# Patient Record
Sex: Female | Born: 1960
Health system: Southern US, Community
[De-identification: ages and names within clinical notes are randomized; demographics above are authoritative.]

## PROBLEM LIST (undated history)

## (undated) DIAGNOSIS — F329 Major depressive disorder, single episode, unspecified: Secondary | ICD-10-CM

## (undated) DIAGNOSIS — I10 Essential (primary) hypertension: Secondary | ICD-10-CM

## (undated) DIAGNOSIS — I509 Heart failure, unspecified: Secondary | ICD-10-CM

## (undated) DIAGNOSIS — I700 Atherosclerosis of aorta: Secondary | ICD-10-CM

## (undated) DIAGNOSIS — N952 Postmenopausal atrophic vaginitis: Secondary | ICD-10-CM

## (undated) DIAGNOSIS — L259 Unspecified contact dermatitis, unspecified cause: Secondary | ICD-10-CM

## (undated) DIAGNOSIS — G5603 Carpal tunnel syndrome, bilateral upper limbs: Secondary | ICD-10-CM

## (undated) DIAGNOSIS — K5790 Diverticulosis of intestine, part unspecified, without perforation or abscess without bleeding: Secondary | ICD-10-CM

## (undated) DIAGNOSIS — M545 Low back pain, unspecified: Secondary | ICD-10-CM

## (undated) DIAGNOSIS — R9431 Abnormal electrocardiogram [ECG] [EKG]: Secondary | ICD-10-CM

## (undated) DIAGNOSIS — E559 Vitamin D deficiency, unspecified: Secondary | ICD-10-CM

## (undated) DIAGNOSIS — M1990 Unspecified osteoarthritis, unspecified site: Secondary | ICD-10-CM

## (undated) DIAGNOSIS — I639 Cerebral infarction, unspecified: Secondary | ICD-10-CM

## (undated) DIAGNOSIS — G4700 Insomnia, unspecified: Secondary | ICD-10-CM

## (undated) DIAGNOSIS — E8881 Metabolic syndrome: Secondary | ICD-10-CM

## (undated) DIAGNOSIS — K219 Gastro-esophageal reflux disease without esophagitis: Secondary | ICD-10-CM

## (undated) DIAGNOSIS — M170 Bilateral primary osteoarthritis of knee: Secondary | ICD-10-CM

## (undated) DIAGNOSIS — J449 Chronic obstructive pulmonary disease, unspecified: Secondary | ICD-10-CM

## (undated) DIAGNOSIS — J40 Bronchitis, not specified as acute or chronic: Secondary | ICD-10-CM

## (undated) DIAGNOSIS — F32A Depression, unspecified: Secondary | ICD-10-CM

## (undated) DIAGNOSIS — M5030 Other cervical disc degeneration, unspecified cervical region: Secondary | ICD-10-CM

## (undated) DIAGNOSIS — K589 Irritable bowel syndrome without diarrhea: Secondary | ICD-10-CM

## (undated) DIAGNOSIS — E785 Hyperlipidemia, unspecified: Secondary | ICD-10-CM

## (undated) DIAGNOSIS — D649 Anemia, unspecified: Secondary | ICD-10-CM

## (undated) DIAGNOSIS — N951 Menopausal and female climacteric states: Secondary | ICD-10-CM

## (undated) DIAGNOSIS — E2839 Other primary ovarian failure: Secondary | ICD-10-CM

## (undated) DIAGNOSIS — K279 Peptic ulcer, site unspecified, unspecified as acute or chronic, without hemorrhage or perforation: Secondary | ICD-10-CM

## (undated) DIAGNOSIS — L309 Dermatitis, unspecified: Secondary | ICD-10-CM

## (undated) HISTORY — PX: JOINT REPLACEMENT: SHX530

## (undated) HISTORY — DX: Depression, unspecified: F32.A

## (undated) HISTORY — PX: CERVICAL DISCECTOMY: SHX98

## (undated) HISTORY — DX: Metabolic syndrome: E88.810

## (undated) HISTORY — PX: BACK SURGERY: SHX140

## (undated) HISTORY — PX: TONSILLECTOMY: SUR1361

## (undated) HISTORY — DX: Other primary ovarian failure: E28.39

## (undated) HISTORY — PX: BREAST BIOPSY: SHX20

## (undated) HISTORY — DX: Low back pain: M54.5

## (undated) HISTORY — DX: Chronic obstructive pulmonary disease, unspecified: J44.9

## (undated) HISTORY — DX: Insomnia, unspecified: G47.00

## (undated) HISTORY — DX: Metabolic syndrome: E88.81

## (undated) HISTORY — DX: Postmenopausal atrophic vaginitis: N95.2

## (undated) HISTORY — DX: Essential (primary) hypertension: I10

## (undated) HISTORY — DX: Vitamin D deficiency, unspecified: E55.9

## (undated) HISTORY — PX: BREAST LUMPECTOMY: SHX2

## (undated) HISTORY — DX: Unspecified contact dermatitis, unspecified cause: L25.9

## (undated) HISTORY — DX: Irritable bowel syndrome, unspecified: K58.9

## (undated) HISTORY — PX: DILATION AND CURETTAGE OF UTERUS: SHX78

## (undated) HISTORY — PX: ENDOMETRIAL ABLATION: SHX621

## (undated) HISTORY — DX: Menopausal and female climacteric states: N95.1

## (undated) HISTORY — PX: CHOLECYSTECTOMY: SHX55

## (undated) HISTORY — PX: SPINAL FUSION: SHX223

## (undated) HISTORY — DX: Hyperlipidemia, unspecified: E78.5

## (undated) HISTORY — DX: Major depressive disorder, single episode, unspecified: F32.9

## (undated) HISTORY — DX: Abnormal electrocardiogram (ECG) (EKG): R94.31

## (undated) HISTORY — DX: Low back pain, unspecified: M54.50

## (undated) HISTORY — PX: BREAST EXCISIONAL BIOPSY: SUR124

## (undated) HISTORY — PX: TUBAL LIGATION: SHX77

---

## 2003-09-23 ENCOUNTER — Other Ambulatory Visit: Payer: Self-pay

## 2005-02-23 ENCOUNTER — Ambulatory Visit: Payer: Self-pay | Admitting: Unknown Physician Specialty

## 2005-03-24 ENCOUNTER — Emergency Department: Payer: Self-pay | Admitting: Emergency Medicine

## 2005-07-15 ENCOUNTER — Other Ambulatory Visit: Payer: Self-pay

## 2005-07-28 ENCOUNTER — Ambulatory Visit: Payer: Self-pay

## 2005-08-28 ENCOUNTER — Ambulatory Visit (HOSPITAL_COMMUNITY): Admission: RE | Admit: 2005-08-28 | Discharge: 2005-08-28 | Payer: Self-pay | Admitting: Neurosurgery

## 2005-10-19 ENCOUNTER — Encounter: Payer: Self-pay | Admitting: Neurosurgery

## 2005-11-02 ENCOUNTER — Encounter: Payer: Self-pay | Admitting: Neurosurgery

## 2005-12-28 ENCOUNTER — Encounter: Admission: RE | Admit: 2005-12-28 | Discharge: 2005-12-28 | Payer: Self-pay | Admitting: Neurosurgery

## 2006-01-04 ENCOUNTER — Encounter: Payer: Self-pay | Admitting: Neurosurgery

## 2006-03-11 ENCOUNTER — Ambulatory Visit (HOSPITAL_COMMUNITY): Admission: RE | Admit: 2006-03-11 | Discharge: 2006-03-15 | Payer: Self-pay | Admitting: Neurosurgery

## 2006-03-12 ENCOUNTER — Ambulatory Visit: Payer: Self-pay | Admitting: Internal Medicine

## 2006-04-02 ENCOUNTER — Ambulatory Visit: Payer: Self-pay | Admitting: Internal Medicine

## 2006-04-06 ENCOUNTER — Ambulatory Visit: Payer: Self-pay

## 2006-04-28 ENCOUNTER — Ambulatory Visit (HOSPITAL_COMMUNITY): Admission: RE | Admit: 2006-04-28 | Discharge: 2006-04-29 | Payer: Self-pay | Admitting: Neurosurgery

## 2006-05-20 ENCOUNTER — Ambulatory Visit: Payer: Self-pay | Admitting: Internal Medicine

## 2006-06-02 ENCOUNTER — Ambulatory Visit: Payer: Self-pay | Admitting: Internal Medicine

## 2006-07-02 ENCOUNTER — Ambulatory Visit: Payer: Self-pay | Admitting: Neurosurgery

## 2006-08-04 ENCOUNTER — Emergency Department: Payer: Self-pay | Admitting: Emergency Medicine

## 2006-12-08 ENCOUNTER — Ambulatory Visit: Payer: Self-pay | Admitting: Neurosurgery

## 2007-03-08 ENCOUNTER — Ambulatory Visit: Payer: Self-pay | Admitting: Neurosurgery

## 2007-08-04 ENCOUNTER — Ambulatory Visit: Payer: Self-pay | Admitting: Neurosurgery

## 2007-09-19 ENCOUNTER — Emergency Department: Payer: Self-pay | Admitting: Emergency Medicine

## 2007-11-25 ENCOUNTER — Ambulatory Visit: Payer: Self-pay

## 2007-12-01 ENCOUNTER — Ambulatory Visit: Payer: Self-pay

## 2007-12-26 ENCOUNTER — Ambulatory Visit (HOSPITAL_COMMUNITY): Admission: RE | Admit: 2007-12-26 | Discharge: 2007-12-27 | Payer: Self-pay | Admitting: Neurosurgery

## 2008-01-24 ENCOUNTER — Ambulatory Visit: Payer: Self-pay

## 2008-01-31 ENCOUNTER — Ambulatory Visit: Payer: Self-pay

## 2008-03-01 ENCOUNTER — Encounter: Admission: RE | Admit: 2008-03-01 | Discharge: 2008-03-01 | Payer: Self-pay | Admitting: Neurosurgery

## 2008-04-12 ENCOUNTER — Encounter: Admission: RE | Admit: 2008-04-12 | Discharge: 2008-04-12 | Payer: Self-pay | Admitting: Neurosurgery

## 2008-06-20 ENCOUNTER — Ambulatory Visit: Payer: Self-pay | Admitting: Family Medicine

## 2008-08-22 ENCOUNTER — Ambulatory Visit: Payer: Self-pay | Admitting: Neurosurgery

## 2008-11-14 ENCOUNTER — Ambulatory Visit (HOSPITAL_COMMUNITY): Admission: RE | Admit: 2008-11-14 | Discharge: 2008-11-15 | Payer: Self-pay | Admitting: Neurosurgery

## 2008-12-13 ENCOUNTER — Ambulatory Visit: Payer: Self-pay | Admitting: Neurosurgery

## 2008-12-22 ENCOUNTER — Ambulatory Visit: Payer: Self-pay | Admitting: Neurosurgery

## 2009-03-29 ENCOUNTER — Ambulatory Visit: Payer: Self-pay | Admitting: Neurosurgery

## 2009-05-13 ENCOUNTER — Ambulatory Visit: Payer: Self-pay | Admitting: Pain Medicine

## 2009-07-01 ENCOUNTER — Ambulatory Visit: Payer: Self-pay | Admitting: Pain Medicine

## 2009-07-12 ENCOUNTER — Ambulatory Visit: Payer: Self-pay | Admitting: Pain Medicine

## 2009-07-15 ENCOUNTER — Ambulatory Visit: Payer: Self-pay | Admitting: Pain Medicine

## 2009-07-29 ENCOUNTER — Emergency Department: Payer: Self-pay | Admitting: Emergency Medicine

## 2009-08-27 ENCOUNTER — Ambulatory Visit: Payer: Self-pay | Admitting: Physician Assistant

## 2009-09-03 ENCOUNTER — Ambulatory Visit: Payer: Self-pay | Admitting: Gastroenterology

## 2009-09-24 ENCOUNTER — Ambulatory Visit: Payer: Self-pay | Admitting: Physician Assistant

## 2009-10-08 ENCOUNTER — Ambulatory Visit: Payer: Self-pay | Admitting: Family Medicine

## 2009-10-09 ENCOUNTER — Ambulatory Visit: Payer: Self-pay | Admitting: Family Medicine

## 2009-11-18 ENCOUNTER — Encounter
Admission: RE | Admit: 2009-11-18 | Discharge: 2010-02-16 | Payer: Self-pay | Admitting: Physical Medicine & Rehabilitation

## 2009-11-19 ENCOUNTER — Ambulatory Visit: Payer: Self-pay | Admitting: Physical Medicine & Rehabilitation

## 2009-12-10 ENCOUNTER — Emergency Department: Payer: Self-pay | Admitting: Emergency Medicine

## 2009-12-17 ENCOUNTER — Ambulatory Visit: Payer: Self-pay | Admitting: Physical Medicine & Rehabilitation

## 2009-12-24 ENCOUNTER — Encounter: Payer: Self-pay | Admitting: Physical Medicine & Rehabilitation

## 2009-12-31 ENCOUNTER — Encounter: Payer: Self-pay | Admitting: Physical Medicine & Rehabilitation

## 2010-01-14 ENCOUNTER — Ambulatory Visit: Payer: Self-pay | Admitting: Physical Medicine & Rehabilitation

## 2010-01-31 ENCOUNTER — Encounter: Payer: Self-pay | Admitting: Physical Medicine & Rehabilitation

## 2010-02-18 ENCOUNTER — Encounter
Admission: RE | Admit: 2010-02-18 | Discharge: 2010-05-19 | Payer: Self-pay | Admitting: Physical Medicine & Rehabilitation

## 2010-02-18 ENCOUNTER — Ambulatory Visit: Payer: Self-pay | Admitting: Physical Medicine & Rehabilitation

## 2010-03-19 ENCOUNTER — Ambulatory Visit: Payer: Self-pay | Admitting: Physical Medicine & Rehabilitation

## 2010-04-16 ENCOUNTER — Ambulatory Visit: Payer: Self-pay | Admitting: Physical Medicine & Rehabilitation

## 2010-05-01 ENCOUNTER — Ambulatory Visit: Payer: Self-pay | Admitting: Family Medicine

## 2010-05-15 ENCOUNTER — Encounter
Admission: RE | Admit: 2010-05-15 | Discharge: 2010-06-18 | Payer: Self-pay | Admitting: Physical Medicine & Rehabilitation

## 2010-05-23 ENCOUNTER — Ambulatory Visit: Payer: Self-pay | Admitting: Physical Medicine & Rehabilitation

## 2010-06-18 ENCOUNTER — Ambulatory Visit: Payer: Self-pay | Admitting: Physical Medicine & Rehabilitation

## 2010-12-16 ENCOUNTER — Ambulatory Visit: Payer: Self-pay | Admitting: Family Medicine

## 2011-02-16 LAB — CBC
HCT: 39.5 % (ref 36.0–46.0)
Hemoglobin: 13 g/dL (ref 12.0–15.0)
MCHC: 32.8 g/dL (ref 30.0–36.0)
MCV: 90.3 fL (ref 78.0–100.0)
Platelets: 264 10*3/uL (ref 150–400)
RBC: 4.38 MIL/uL (ref 3.87–5.11)
RDW: 13.4 % (ref 11.5–15.5)
WBC: 9.7 10*3/uL (ref 4.0–10.5)

## 2011-02-16 LAB — BASIC METABOLIC PANEL
BUN: 8 mg/dL (ref 6–23)
CO2: 30 mEq/L (ref 19–32)
Calcium: 9.5 mg/dL (ref 8.4–10.5)
Chloride: 101 mEq/L (ref 96–112)
Creatinine, Ser: 0.54 mg/dL (ref 0.4–1.2)
GFR calc Af Amer: >60 mL/min (ref >60)
GFR calc non Af Amer: >60 mL/min (ref >60)
Glucose, Bld: 100 mg/dL — ABNORMAL HIGH (ref 70–99)
Potassium: 4.2 mEq/L (ref 3.5–5.1)
Sodium: 138 mEq/L (ref 135–145)

## 2011-03-17 NOTE — Op Note (Signed)
NAMEHANNI, [name]               ACCOUNT NO.:  [id number]   MEDICAL RECORD NO.:  [id number]          PATIENT TYPE:  OIB   LOCATION:  3524                         FACILITY:  [facility]   PHYSICIAN:  Donalee Citrin, M.D.        DATE OF BIRTH:  1961/08/19   DATE OF PROCEDURE:  11/14/2008  DATE OF DISCHARGE:                               OPERATIVE REPORT   PREOPERATIVE DIAGNOSES:  Right L4 radiculopathy and left L5  radiculopathy from ruptured disk L3-L4 right and lateral stenosis of L4-  L5 left.   PROCEDURE:  Lumbar laminectomy and microdiskectomy at L3-L4 right with  microscopic dissection of the right L4 nerve root, microscopic  diskectomy and decompression of lumbar laminectomy of L4-L5 left with  microdissection of the left L5 nerve root.   SURGEON:  Donalee Citrin, MD.   ASSISTANT:  Tia Alert, MD.   ANESTHESIA:  General anesthesia.   PROCEDURE:  The patient is a very pleasant 50 year old female who has  had longstanding back and neck pain.  The pain got progressively worse,  going down both legs.  On the right leg, it traveled down to the lateral  aspect of her right thigh into the front of her shin on the right leg.  On the left leg, it traveled down the back of the leg across at the top  of the foot and big toe consistent with both an L4 and L5 radiculopathy  respectively.  The patient failed all forms of conservative treatment  with anti-inflammatories, physical therapy, failing steroid injections  at this time and the patient was recommended unilateral microdiskectomy  at L3-L4 on the right and left side to decompress the laminectomy on the  left.  The risks and benefits of the operation were explained.  The  patient understood and agreed to proceed forth.   DESCRIPTION OF PROCEDURE:  The patient was brought to the OR and induced  under anesthesia, positioned prone on the Wilson frame.  Back was  prepped and draped in the routine sterile fashion.  Her old incision was  visualized and the incision was opened up to superior to this.  Intraoperative x-ray confirmed localization of the L4-L5 disk space on  the left using high-speed drill.  The inferior aspect of L4 medial facet  complex and superior aspect of L5 was drilled down.  Then, the ligament  was identified.  The ligament at first was removed in piecemeal fashion  with a 2-mm inferior Kerrison punch.  Then the lateral margin of the  gutter was underbitten, decompressing the proximal L5 nerve root and the  L5 neuroforamen was unroofed.  The disk space was inspected.  This was  noted not to be bulging and this was packed with Gelfoam and the  attention was then taken to L3-L4 on the right.  The subperiosteal  dissection was carried out at L3-L4 on the right.  Again, the inferior  aspect of L3 medial facet complexes and the superior aspect of L4 was  drilled down, then using 3 mm Kerrison punch, laminotomy was begun.  Lateral and medial facetectomies were  performed.  The ligament was then  removed in a piecemeal fashion exposing the proximal L4 nerve root on  the right.  At this point, the operating microscope was directly brought  onto the field under microscopic illumination.  The remainder of the  medial facet complexes was underbitten to gain access to the lateral  margin of the disk space.  The L3 nerve root was then markedly stenotic  from a large disk herniation, it still contained with a ligament  underneath it.  So, a D'Errico was used to reflect the L4 nerve root  medially.  The disk space was incised using 11 blade scalpel.  The disk  space was cleaned out radically with Epstein curettes and pituitary  rongeurs.  At the end of the diskectomy, there was no further stenosis  on the L4 nerve roots and was explored with a coronary dilator and  angled hockey stick and noted to be widely patent.  Meticulous  hemostasis was maintained and at this pont, Gelfoam was laid on top of  the dura.  After  copious irrigation, the retractor was removed and  repositioned back at L4-L5 on the left and under the microscope  illumination, the L5 nerve root on the left was then identified.  The L5  neuroforamen was confirmed to be widely patent.  Meticulous hemostasis  was maintained. The wound was copiously irrigated. Gelfoam was overlaid  on top of the dura.  The retractor was removed and the scope was removed  and the wound was closed in layers with interrupted Vicryl, and the skin  was closed with a running 4-0 subcuticular.  Benzoin and Steri-Strips  were applied.  The patient went to the recovery room in stable  condition.  At the end of the case, sponge and instrument count were  correct.           ______________________________  Donalee Citrin, M.D.     GC/MEDQ  D:  11/14/2008  T:  11/15/2008  Job:  [id number]

## 2011-03-17 NOTE — Op Note (Signed)
[name], [name]               ACCOUNT NO.:  [id number]   MEDICAL RECORD NO.:  [id number]          PATIENT TYPE:  AMB   LOCATION:  SDS                          FACILITY:  [facility]   PHYSICIAN:  Donalee Citrin, M.D.        DATE OF BIRTH:  [birth year]   DATE OF PROCEDURE:  DATE OF DISCHARGE:                               OPERATIVE REPORT   PREOPERATIVE DIAGNOSIS:  Cervical spondylosis with stenosis from  ruptured disk at C3-4 and C6-7.   POSTOPERATIVE DIAGNOSIS:  Cervical spondylosis with stenosis from  ruptured disk at C3-4 and C6-7.   PROCEDURE:  1. Re-exploration of anterior cervical fusion from C4 to C6 with      removal of hardware C4 to C6.  2. Anterior cervical diskectomy and fusion at C3-4 using a 6-mm      allograft wedge and a 27-mm Sofamor Danek Venture plate with four      13-mm variable screws.  3. Anterior cervical diskectomy and fusion at C6-7 using a 6-mm      allograft wedge and 27-mm Venture plate with 4 16-X variable      screws.   SURGEON:  Donalee Citrin, M.D.   ASSISTANT:  Kathaleen Maser. Pool, M.D.   ANESTHESIA:  General endotracheal.   HISTORY OF PRESENT ILLNESS:  The patient is a very pleasant 50 year old  female who underwent anterior cervical diskectomy and fusion but a year  and a half ago and has had progressive worsening neck pain with pain  radiating to the webspace of her neck as well as into both hands and the  first 2 fingers of both hands.  Repeat MRI scan showed severe stenosis  and breakdown of the disk spaces above and below her fusion at C3-4 and  C6-7.  The patient failed all forms of conservative treatment and  started developing progressive numbness and tingling.  Because of  failure of conservative treatment, it was recommended to anterior  cervical diskectomies and fusion at C3-4 and C6-7 with exploration of  fusion at C4-6 with possible removal of hardware C4-C6.  Risks and  benefits of the operation explained to the patient.  She understood and  agreed to proceed.   The patient was brought to the OR and was induced under general  anesthesia.  She was positioned supine and __________ extension 5 pounds  of halter traction.  The right side of the neck was prepped and draped  in the usual fashion.   Her old incision was opened up and extended mediolaterally.  The scar  tissue was dissected free, and the superficial layer of the platysma was  dissected out and divided longitudinally.  The avascular plane between  the sternocleidomastoid and strap muscles was developed down to the  prevertebral fascia.  The prevertebral fascia was then dissected away  with Kittners.  The old plate was immediately identified, and the fusion  was inspected underneath it.  It was felt to be solid and intact, so the  screw mechanisms and locking mechanisms were disengaged and the screws  were removed and the plate was removed from C4-C6.  Fusion was again  reinspected and noted to have solid bony fusion at the inner plug and  interbody spacers at C4-5 and C5-6.  Then the C3-4 and C6-7 disk spaces  were exposed.  The longus coli was reflected laterally.  A self-  retaining retractor was placed, first at C3-4.  Working just at C3-4,  annulotomy was then made with a 15 blade scalpel.  The disk space was  cleaned out.  There was noted to be marked spondylosis with collapse  with large anterior osteophyte coming off the C3 vertebral body.  This  was bitten off with a 2 and 3 Kerrison punch.  A high-speed drill was  used to drill down the endplates and the posterior annulus and posterior  longitudinal ligament.  We then used the operating microscope.  Under  __________  microscope illumination, the undersurface of the posterior  longitudinal ligament and posterior osteophytes were bitten off with a  120 Kerrison punch.  Large osteophyte coming off the C3 vertebral body  was aggressively underbitten which was causing severe stenosis on the  thecal sac.  There  was noted to be a tremendous amount of epidural  bleeding coming from the epidural venous plexus overlying the proximal  C4 nerve roots on both sides.  This was coagulated with Gelfoam and  bipolar cautery, and both C4 pedicles were identified to confirm  adequate lateral decompression.  At the end of the diskectomy, there was  no further stenosis in the central canal.  The endplates were scraped.  A size 6 graft was inserted 1 mm deep to anterior vertebral body line,  and a 27-mm plate was inserted.  Two rescue screws were placed in the  old screw holes at C4, and two new holes were drilled at C3.  After  placing all screws, we had excellent purchase.  The locking mechanism  was engaged.  The retractor was removed and repositioned down at C6-7.  Annulotomy was made with a 15 blade scalpel, and the disk space was  cleaned out at C6-7.  Aggressive drilling and underbiting of the  posterior annulus and posterior osteophytic complexes coming off the C6  and C7 vertebral bodies was carried out with a 1 and 2-mm Kerrison  punch, decompressing the central canal.  There was noted to be a very  large osteophyte causing marked spinal stenosis as well as large soft  disk material both centrally, right greater than left.  Both C7 nerve  roots were identified and skeletonized out their foramen.  Both pedicles  were identified, and the nerve roots were identified flush with the  pedicle.  The endplates were then scraped.  A 6-mm graft was inserted a  C6-7 __________  anterior vertebral body line.  Then a 27-mm Venture  plate was then placed.  Two rescue screws placed in the previous holes  at C6, and new holes were drilled at C7.  After both plates had been  inserted, the wound was copiously irrigated and meticulous hemostasis  was maintained.  Postop fluoroscopy confirmed good position of the  plate, screws, and bone graft.  Then the wound was closed after  confirmation of meticulous hemostasis in  layers with interrupted Vicryl  on the platysma and a running 4-0 running subcuticular.  Benzoin and  Steri-Strips were applied.   The patient went to the recovery room in stable condition.  At the end  of the case, needle and sponge counts were correct.  ______________________________  Donalee Citrin, M.D.     GC/MEDQ  D:  12/26/2007  T:  12/27/2007  Job:  [id number]

## 2011-03-20 NOTE — Op Note (Signed)
NAMEDERYL, PORTS               ACCOUNT NO.:  [id number]   MEDICAL RECORD NO.:  [id number]          PATIENT TYPE:  AMB   LOCATION:  SDS                          FACILITY:  [facility]   PHYSICIAN:  Donalee Citrin, M.D.        DATE OF BIRTH:  06/08/1961   DATE OF PROCEDURE:  08/28/2005  DATE OF DISCHARGE:                                 OPERATIVE REPORT   PREOPERATIVE DIAGNOSIS:  Right S1 radiculopathy from ruptured disk L5-S1,  right.   PROCEDURE:  Lumbar laminectomy, microdiskectomy L5-S1, right, with  microscopic dissection of the right S1 nerve root.   SURGEON:  Donalee Citrin, M.D.   ASSISTANT:  Tia Alert, M.D.   ANESTHESIA:  General endotracheal.   HISTORY OF PRESENT ILLNESS:  The patient is a 50 year old female, who has  had long-standing back and right leg pain that has been refractory to all  forms of conservative treatment with physical therapy and steroids.  The  patient was recommended laminectomy and microdiskectomy after preoperative  imaging showed severe foraminal stenosis of the S1 nerve root due to a  rightward disk herniation and her failure of conservative treatment.  The  risks and benefits explained to the patient and she understands and agreed  to proceed forward.   DESCRIPTION OF PROCEDURE:  The patient was brought to the OR, induced under  anesthesia, positioned prone on the Wilson back frame, and the back  appropriately prepped and draped in sterile fashion.  A preop x-ray  localized the L5-S1 disk space.  After infiltration with 10 mL of lidocaine  with epinephrine, a midline incision was made, and Bovie electrocautery was  used to take down subcutaneous tissues and subperiosteal dissection was  carried out of lamina of L5 and S1.  This was confirmed by intraoperative x-  ray to be the appropriate level.  Then the inferior aspect of lamina of L5,  medial facet complex, and superior aspect of lamina of S1 was removed with a  3 and 4 mm Kerrison punch.  This  exposed the ligamentum flavum, which was  removed in piecemeal fashion.  At this point, the operating microscope was  draped and brought onto the field.  Under microscopic illumination, the  medial and lateral gutter was dissected free with a 4 Penfield, and the  lateral ligament and lateral gutter was opened up to identify the S1 nerve  root and the S1 pedicle.  Then using a 4 Penfield, the S1 nerve root was  dissected off a large bulbous disk fragment still contained within the  ligament and reflected medially with a D'Errico nerve root retractor.  Epidural veins were coagulated.  Annulotomy was made and several large  fragments immediately were expressed under pressure from within the disk  space.  These were removed in piecemeal fashion, disk space gradually  cleaned out.  Using a combination of downgoing Epstein curettes, upbiting  pituitaries, and straight pituitary rongeurs, the disk space was gradually  cleaned out, and the S1 nerve root and thecal sac was completely  decompressed.  This was explored with a coronary dilator and  angled hockey-  stick, and noted to have no further stenosis.  The wound was  then copiously irrigated.  Meticulous hemostasis was maintained.  Gelfoam  was overlaid on top of the dura.  The muscle and fascia were reapproximated  in layers with interrupted Vicryl, and the skin was closed with running 4-0  subcuticular.  Benzoin and Steri-Strips applied.  The patient went to the  recovery room in stable condition.           ______________________________  Donalee Citrin, M.D.     GC/MEDQ  D:  08/28/2005  T:  08/29/2005  Job:  [id number]

## 2011-03-20 NOTE — Op Note (Signed)
[name], [name]               ACCOUNT NO.:  [id number]   MEDICAL RECORD NO.:  [id number]          PATIENT TYPE:  AMB   LOCATION:  SDS                          FACILITY:  [facility]   PHYSICIAN:  Donalee Citrin, M.D.        DATE OF BIRTH:  September 25, 1961   DATE OF PROCEDURE:  04/28/2006  DATE OF DISCHARGE:                                 OPERATIVE REPORT   PREOPERATIVE DIAGNOSIS:  Cervical spondylosis and spondylitic radiculopathy  C5 and C6 bilaterally, right greater than left.   POSTOPERATIVE DIAGNOSIS:  Not given.   PROCEDURE:  Anterior cervical diskectomy and fusion at C4-5 and C5-6 using a  5 mm Lifenet wedge at C4-5 and a 5 mm Lifenet wedge at C5-6 with a 40 mm  Venture plate and six 13 mm bare metal screws.   SURGEON:  Donalee Citrin, M.D.   ASSISTANT:  Tia Alert, MD   ANESTHESIA:  General endotracheal.   INDICATIONS FOR PROCEDURE:  The patient is a pleasant 50 year old female  whose had longstanding predominant neck pain with radiation to her right  shoulder and occasionally down the right forearm into her thumb and  forefinger. The patient had weakness of the deltoid biceps and a little bit  of pain when moving the triceps in the right arm as well. The patient had  bilateral symptoms with the pain also going down the left arm. Preoperative  imaging showed severe spondylosis predominantly at C5-6 but also some  foraminal stenosis on the right at C4-5 with some soft disk consistent with  disk on the myelogram at C4-5. The patient had both C5 and C6 symptoms. The  patient was recommended two level anterior cervical. Initially the patient  had been consented for C5-6 and C6-7 however C6-7 was widely patent. I felt  the symptoms were definitely more C5 related and C4-5 looked worse on the  myelogram. In addition, the patient had severe kyphosis at this level and a  kyphotic deformity as well as a scoliotic deformity that I think is  contributing to her axial neck pain. So after she  failed all forms of  conservative treatment with physical therapy, antiinflammatories, time and  pain medication, the patient was recommended two level anterior cervical.  The risks and benefits were discussed. The patient understood and agreed to  proceed.   DESCRIPTION OF PROCEDURE:  The patient was brought to the OR, received  general anesthesia, positioned supine, neck in slight extension with 5  pounds of halter traction. The right side of the neck was prepped and draped  in a sterile fashion. Preoperatively, I localized the C5-6 vertebral level  and then a curvilinear incision was made just off the midline to the  anterior border of the of the sternocleidomastoid, superficial layer of the  platysma was dissected out and divided longitudinally. The avascular planes  of the sternocleidomastoid and the strap muscle was developed down to the  prevertebral fascia.  The prevertebral fascia was dissected with the  Kitners.  Intraoperative x-ray confirmed localization of the C5-6 disk  space.  Annulotomy was then done with  a  scalpel and marked the disk space  and the longus colli was reflected laterally. At C4-5, the prevertebral  fascia was dissected away, the longus colli was reflected, self retaining  retractor was placed, annulotomy was made at both and extended at C4-5 and  C5-6. Then  using the high speed drill, both interspaces were drilled down  to the posterior annulus and posterior osteophytic complexes.  The disk  space at C5-6 was found to be markedly collapsed with large anterior  osteophytes that were bitten off with a 2 and 3 mm Kerrison punch and this  interspace was drilled down at C4-5. There was also noted to be marked  spondylosis and uncinate hypertrophy. At this point, the operating  microscope was draped, brought onto the field for microscopic illumination  at C5-6. The undersurface of the 5 and 6 endplates were under bitten  exposing the posterior longitudinal  ligament and this was removed in a  piecemeal fashion exposing the thecal sac. There was a marked compression of  the spinal cord in both proximal C6 neural foramina due to complete  degeneration of the uncinate process with displacement predominantly at the  right side in the right C6 nerve root dorsally. This was all teased away  with a black nerve hook and removed with Kerrison rongeurs and both C6  neural foramina were opened up and both being markedly stenotic. At the end  of the diskectomy compresssion, both endplates again were under bitten to  ensure adequate compression and resumption of the dura to normal anatomic  position. Gelfoam was placed at C5-6. Then at C4-5, again the interspace was  drilled down to the posterior longitudinal ligament which was under bitten  with the 1 and 2 mm Kerrison punches and again the uncinate process was  noted to be markedly degenerated and hypertrophied causing marked  compression of the proximal neural foramen. This was all under bitten and  both C5 neural foramina were identified and decompressed out their foramen.  At the end of the diskectomies at both levels, the nerve roots were widely  patent. I explored with an angled nerve hook and this was met with no  resistance. Then the endplates were scraped with a BA curette. A 5 mm  Lifenet wedge was inserted at C4-5. Initially a 6 mm had been opened up for  C5-6, however, this was noted to be slightly large and it fractured on  placement so this was removed and a 5 mm graft was opened and placed at C5-  6. Then a 40 mm Venture plating system was used, six 13 mm bare metal screws  were placed. All screws had excellent purchase. Postop fluoroscopy confirmed  good position of bone grafts and plate and screws. The wound was then  copiously irrigated and meticulous hemostasis was maintained.  Platysma reapproximated with 3-0 interrupted Vicryl and skin closed with a running 4-  0 subcuticular. Benzoin  and Steri-Strips applied. The patient went to the  recovery room in good condition. At the end of the case needle counts were  correct.           ______________________________  Donalee Citrin, M.D.     GC/MEDQ  D:  04/28/2006  T:  04/28/2006  Job:  [id number]

## 2011-05-15 ENCOUNTER — Ambulatory Visit: Payer: Self-pay | Admitting: Emergency Medicine

## 2011-05-21 ENCOUNTER — Ambulatory Visit: Payer: Self-pay | Admitting: Emergency Medicine

## 2011-05-23 LAB — PATHOLOGY REPORT

## 2011-07-24 LAB — CBC
HCT: 39
Hemoglobin: 13
MCHC: 33.3
MCV: 91.2
Platelets: 272
RBC: 4.27
RDW: 12.9
WBC: 8.8

## 2011-12-23 DIAGNOSIS — M542 Cervicalgia: Secondary | ICD-10-CM | POA: Diagnosis not present

## 2011-12-23 DIAGNOSIS — M5137 Other intervertebral disc degeneration, lumbosacral region: Secondary | ICD-10-CM | POA: Diagnosis not present

## 2011-12-23 DIAGNOSIS — G541 Lumbosacral plexus disorders: Secondary | ICD-10-CM | POA: Diagnosis not present

## 2011-12-23 DIAGNOSIS — F341 Dysthymic disorder: Secondary | ICD-10-CM | POA: Diagnosis not present

## 2011-12-23 DIAGNOSIS — Z79899 Other long term (current) drug therapy: Secondary | ICD-10-CM | POA: Diagnosis not present

## 2011-12-23 DIAGNOSIS — M5020 Other cervical disc displacement, unspecified cervical region: Secondary | ICD-10-CM | POA: Diagnosis not present

## 2012-01-07 ENCOUNTER — Ambulatory Visit: Payer: Self-pay | Admitting: Family Medicine

## 2012-01-07 DIAGNOSIS — J449 Chronic obstructive pulmonary disease, unspecified: Secondary | ICD-10-CM | POA: Diagnosis not present

## 2012-01-07 DIAGNOSIS — G4700 Insomnia, unspecified: Secondary | ICD-10-CM | POA: Diagnosis not present

## 2012-01-07 DIAGNOSIS — Z1231 Encounter for screening mammogram for malignant neoplasm of breast: Secondary | ICD-10-CM | POA: Diagnosis not present

## 2012-01-07 DIAGNOSIS — I10 Essential (primary) hypertension: Secondary | ICD-10-CM | POA: Diagnosis not present

## 2012-01-07 DIAGNOSIS — E785 Hyperlipidemia, unspecified: Secondary | ICD-10-CM | POA: Diagnosis not present

## 2012-01-11 DIAGNOSIS — E559 Vitamin D deficiency, unspecified: Secondary | ICD-10-CM | POA: Diagnosis not present

## 2012-01-11 DIAGNOSIS — E785 Hyperlipidemia, unspecified: Secondary | ICD-10-CM | POA: Diagnosis not present

## 2012-01-11 DIAGNOSIS — I10 Essential (primary) hypertension: Secondary | ICD-10-CM | POA: Diagnosis not present

## 2012-01-26 DIAGNOSIS — G541 Lumbosacral plexus disorders: Secondary | ICD-10-CM | POA: Diagnosis not present

## 2012-01-26 DIAGNOSIS — M542 Cervicalgia: Secondary | ICD-10-CM | POA: Diagnosis not present

## 2012-01-26 DIAGNOSIS — Z79899 Other long term (current) drug therapy: Secondary | ICD-10-CM | POA: Diagnosis not present

## 2012-01-26 DIAGNOSIS — M5137 Other intervertebral disc degeneration, lumbosacral region: Secondary | ICD-10-CM | POA: Diagnosis not present

## 2012-01-26 DIAGNOSIS — F341 Dysthymic disorder: Secondary | ICD-10-CM | POA: Diagnosis not present

## 2012-01-26 DIAGNOSIS — F4320 Adjustment disorder, unspecified: Secondary | ICD-10-CM | POA: Diagnosis not present

## 2012-01-26 DIAGNOSIS — M5020 Other cervical disc displacement, unspecified cervical region: Secondary | ICD-10-CM | POA: Diagnosis not present

## 2012-02-24 DIAGNOSIS — M5137 Other intervertebral disc degeneration, lumbosacral region: Secondary | ICD-10-CM | POA: Diagnosis not present

## 2012-02-24 DIAGNOSIS — F341 Dysthymic disorder: Secondary | ICD-10-CM | POA: Diagnosis not present

## 2012-02-24 DIAGNOSIS — G541 Lumbosacral plexus disorders: Secondary | ICD-10-CM | POA: Diagnosis not present

## 2012-02-24 DIAGNOSIS — M5020 Other cervical disc displacement, unspecified cervical region: Secondary | ICD-10-CM | POA: Diagnosis not present

## 2012-03-22 DIAGNOSIS — M5020 Other cervical disc displacement, unspecified cervical region: Secondary | ICD-10-CM | POA: Diagnosis not present

## 2012-03-22 DIAGNOSIS — G541 Lumbosacral plexus disorders: Secondary | ICD-10-CM | POA: Diagnosis not present

## 2012-03-22 DIAGNOSIS — F341 Dysthymic disorder: Secondary | ICD-10-CM | POA: Diagnosis not present

## 2012-03-22 DIAGNOSIS — M5137 Other intervertebral disc degeneration, lumbosacral region: Secondary | ICD-10-CM | POA: Diagnosis not present

## 2012-04-11 DIAGNOSIS — G4700 Insomnia, unspecified: Secondary | ICD-10-CM | POA: Diagnosis not present

## 2012-04-11 DIAGNOSIS — M545 Low back pain: Secondary | ICD-10-CM | POA: Diagnosis not present

## 2012-04-11 DIAGNOSIS — E785 Hyperlipidemia, unspecified: Secondary | ICD-10-CM | POA: Diagnosis not present

## 2012-04-11 DIAGNOSIS — I10 Essential (primary) hypertension: Secondary | ICD-10-CM | POA: Diagnosis not present

## 2012-04-19 DIAGNOSIS — Z79899 Other long term (current) drug therapy: Secondary | ICD-10-CM | POA: Diagnosis not present

## 2012-04-19 DIAGNOSIS — M542 Cervicalgia: Secondary | ICD-10-CM | POA: Diagnosis not present

## 2012-04-19 DIAGNOSIS — F341 Dysthymic disorder: Secondary | ICD-10-CM | POA: Diagnosis not present

## 2012-04-19 DIAGNOSIS — M5137 Other intervertebral disc degeneration, lumbosacral region: Secondary | ICD-10-CM | POA: Diagnosis not present

## 2012-04-19 DIAGNOSIS — M5020 Other cervical disc displacement, unspecified cervical region: Secondary | ICD-10-CM | POA: Diagnosis not present

## 2012-04-19 DIAGNOSIS — G541 Lumbosacral plexus disorders: Secondary | ICD-10-CM | POA: Diagnosis not present

## 2012-05-17 DIAGNOSIS — F341 Dysthymic disorder: Secondary | ICD-10-CM | POA: Diagnosis not present

## 2012-05-17 DIAGNOSIS — H81399 Other peripheral vertigo, unspecified ear: Secondary | ICD-10-CM | POA: Diagnosis not present

## 2012-05-17 DIAGNOSIS — M5137 Other intervertebral disc degeneration, lumbosacral region: Secondary | ICD-10-CM | POA: Diagnosis not present

## 2012-05-17 DIAGNOSIS — G541 Lumbosacral plexus disorders: Secondary | ICD-10-CM | POA: Diagnosis not present

## 2012-05-17 DIAGNOSIS — M5020 Other cervical disc displacement, unspecified cervical region: Secondary | ICD-10-CM | POA: Diagnosis not present

## 2012-05-23 ENCOUNTER — Ambulatory Visit: Payer: Self-pay | Admitting: Anesthesiology

## 2012-05-23 DIAGNOSIS — M5126 Other intervertebral disc displacement, lumbar region: Secondary | ICD-10-CM | POA: Diagnosis not present

## 2012-05-23 DIAGNOSIS — M47817 Spondylosis without myelopathy or radiculopathy, lumbosacral region: Secondary | ICD-10-CM | POA: Diagnosis not present

## 2012-05-23 DIAGNOSIS — M47812 Spondylosis without myelopathy or radiculopathy, cervical region: Secondary | ICD-10-CM | POA: Diagnosis not present

## 2012-06-28 DIAGNOSIS — F341 Dysthymic disorder: Secondary | ICD-10-CM | POA: Diagnosis not present

## 2012-06-28 DIAGNOSIS — M5020 Other cervical disc displacement, unspecified cervical region: Secondary | ICD-10-CM | POA: Diagnosis not present

## 2012-06-28 DIAGNOSIS — G541 Lumbosacral plexus disorders: Secondary | ICD-10-CM | POA: Diagnosis not present

## 2012-06-28 DIAGNOSIS — H81399 Other peripheral vertigo, unspecified ear: Secondary | ICD-10-CM | POA: Diagnosis not present

## 2012-06-28 DIAGNOSIS — Z79899 Other long term (current) drug therapy: Secondary | ICD-10-CM | POA: Diagnosis not present

## 2012-08-16 DIAGNOSIS — G541 Lumbosacral plexus disorders: Secondary | ICD-10-CM | POA: Diagnosis not present

## 2012-08-16 DIAGNOSIS — H81399 Other peripheral vertigo, unspecified ear: Secondary | ICD-10-CM | POA: Diagnosis not present

## 2012-08-16 DIAGNOSIS — F341 Dysthymic disorder: Secondary | ICD-10-CM | POA: Diagnosis not present

## 2012-08-16 DIAGNOSIS — M5020 Other cervical disc displacement, unspecified cervical region: Secondary | ICD-10-CM | POA: Diagnosis not present

## 2012-08-23 DIAGNOSIS — Z1159 Encounter for screening for other viral diseases: Secondary | ICD-10-CM | POA: Diagnosis not present

## 2012-08-23 DIAGNOSIS — Z23 Encounter for immunization: Secondary | ICD-10-CM | POA: Diagnosis not present

## 2012-08-23 DIAGNOSIS — I10 Essential (primary) hypertension: Secondary | ICD-10-CM | POA: Diagnosis not present

## 2012-08-23 DIAGNOSIS — J449 Chronic obstructive pulmonary disease, unspecified: Secondary | ICD-10-CM | POA: Diagnosis not present

## 2012-08-23 DIAGNOSIS — E785 Hyperlipidemia, unspecified: Secondary | ICD-10-CM | POA: Diagnosis not present

## 2012-08-23 DIAGNOSIS — G4700 Insomnia, unspecified: Secondary | ICD-10-CM | POA: Diagnosis not present

## 2012-09-15 DIAGNOSIS — Z79899 Other long term (current) drug therapy: Secondary | ICD-10-CM | POA: Diagnosis not present

## 2012-09-15 DIAGNOSIS — M542 Cervicalgia: Secondary | ICD-10-CM | POA: Diagnosis not present

## 2012-09-15 DIAGNOSIS — F341 Dysthymic disorder: Secondary | ICD-10-CM | POA: Diagnosis not present

## 2012-09-15 DIAGNOSIS — G541 Lumbosacral plexus disorders: Secondary | ICD-10-CM | POA: Diagnosis not present

## 2012-09-15 DIAGNOSIS — H81399 Other peripheral vertigo, unspecified ear: Secondary | ICD-10-CM | POA: Diagnosis not present

## 2012-09-15 DIAGNOSIS — M5020 Other cervical disc displacement, unspecified cervical region: Secondary | ICD-10-CM | POA: Diagnosis not present

## 2012-10-14 DIAGNOSIS — M5020 Other cervical disc displacement, unspecified cervical region: Secondary | ICD-10-CM | POA: Diagnosis not present

## 2012-10-14 DIAGNOSIS — H81399 Other peripheral vertigo, unspecified ear: Secondary | ICD-10-CM | POA: Diagnosis not present

## 2012-10-14 DIAGNOSIS — G541 Lumbosacral plexus disorders: Secondary | ICD-10-CM | POA: Diagnosis not present

## 2012-10-14 DIAGNOSIS — F341 Dysthymic disorder: Secondary | ICD-10-CM | POA: Diagnosis not present

## 2012-10-28 DIAGNOSIS — IMO0002 Reserved for concepts with insufficient information to code with codable children: Secondary | ICD-10-CM | POA: Diagnosis not present

## 2012-10-28 DIAGNOSIS — M5126 Other intervertebral disc displacement, lumbar region: Secondary | ICD-10-CM | POA: Diagnosis not present

## 2012-10-28 DIAGNOSIS — M4712 Other spondylosis with myelopathy, cervical region: Secondary | ICD-10-CM | POA: Diagnosis not present

## 2012-11-14 DIAGNOSIS — M5020 Other cervical disc displacement, unspecified cervical region: Secondary | ICD-10-CM | POA: Diagnosis not present

## 2012-11-14 DIAGNOSIS — H81399 Other peripheral vertigo, unspecified ear: Secondary | ICD-10-CM | POA: Diagnosis not present

## 2012-11-14 DIAGNOSIS — F341 Dysthymic disorder: Secondary | ICD-10-CM | POA: Diagnosis not present

## 2012-11-14 DIAGNOSIS — G541 Lumbosacral plexus disorders: Secondary | ICD-10-CM | POA: Diagnosis not present

## 2012-12-01 ENCOUNTER — Ambulatory Visit: Payer: Self-pay | Admitting: Neurosurgery

## 2012-12-01 DIAGNOSIS — M4712 Other spondylosis with myelopathy, cervical region: Secondary | ICD-10-CM | POA: Diagnosis not present

## 2012-12-01 DIAGNOSIS — Z9889 Other specified postprocedural states: Secondary | ICD-10-CM | POA: Diagnosis not present

## 2012-12-12 DIAGNOSIS — F329 Major depressive disorder, single episode, unspecified: Secondary | ICD-10-CM | POA: Diagnosis not present

## 2012-12-12 DIAGNOSIS — E785 Hyperlipidemia, unspecified: Secondary | ICD-10-CM | POA: Diagnosis not present

## 2012-12-12 DIAGNOSIS — R300 Dysuria: Secondary | ICD-10-CM | POA: Diagnosis not present

## 2012-12-12 DIAGNOSIS — I10 Essential (primary) hypertension: Secondary | ICD-10-CM | POA: Diagnosis not present

## 2012-12-12 DIAGNOSIS — G4700 Insomnia, unspecified: Secondary | ICD-10-CM | POA: Diagnosis not present

## 2012-12-16 DIAGNOSIS — M542 Cervicalgia: Secondary | ICD-10-CM | POA: Diagnosis not present

## 2012-12-16 DIAGNOSIS — R269 Unspecified abnormalities of gait and mobility: Secondary | ICD-10-CM | POA: Diagnosis not present

## 2012-12-16 DIAGNOSIS — Z79899 Other long term (current) drug therapy: Secondary | ICD-10-CM | POA: Diagnosis not present

## 2012-12-16 DIAGNOSIS — H8190 Unspecified disorder of vestibular function, unspecified ear: Secondary | ICD-10-CM | POA: Diagnosis not present

## 2012-12-16 DIAGNOSIS — G541 Lumbosacral plexus disorders: Secondary | ICD-10-CM | POA: Diagnosis not present

## 2012-12-16 DIAGNOSIS — F341 Dysthymic disorder: Secondary | ICD-10-CM | POA: Diagnosis not present

## 2012-12-16 DIAGNOSIS — H81399 Other peripheral vertigo, unspecified ear: Secondary | ICD-10-CM | POA: Diagnosis not present

## 2012-12-16 DIAGNOSIS — Z9181 History of falling: Secondary | ICD-10-CM | POA: Diagnosis not present

## 2012-12-16 DIAGNOSIS — M5020 Other cervical disc displacement, unspecified cervical region: Secondary | ICD-10-CM | POA: Diagnosis not present

## 2012-12-30 DIAGNOSIS — M5126 Other intervertebral disc displacement, lumbar region: Secondary | ICD-10-CM | POA: Diagnosis not present

## 2013-01-13 DIAGNOSIS — H8190 Unspecified disorder of vestibular function, unspecified ear: Secondary | ICD-10-CM | POA: Diagnosis not present

## 2013-01-13 DIAGNOSIS — F341 Dysthymic disorder: Secondary | ICD-10-CM | POA: Diagnosis not present

## 2013-01-13 DIAGNOSIS — G541 Lumbosacral plexus disorders: Secondary | ICD-10-CM | POA: Diagnosis not present

## 2013-01-13 DIAGNOSIS — H81399 Other peripheral vertigo, unspecified ear: Secondary | ICD-10-CM | POA: Diagnosis not present

## 2013-01-23 DIAGNOSIS — R209 Unspecified disturbances of skin sensation: Secondary | ICD-10-CM | POA: Diagnosis not present

## 2013-01-23 DIAGNOSIS — L98499 Non-pressure chronic ulcer of skin of other sites with unspecified severity: Secondary | ICD-10-CM | POA: Diagnosis not present

## 2013-02-14 DIAGNOSIS — F341 Dysthymic disorder: Secondary | ICD-10-CM | POA: Diagnosis not present

## 2013-02-14 DIAGNOSIS — H81399 Other peripheral vertigo, unspecified ear: Secondary | ICD-10-CM | POA: Diagnosis not present

## 2013-02-14 DIAGNOSIS — H8190 Unspecified disorder of vestibular function, unspecified ear: Secondary | ICD-10-CM | POA: Diagnosis not present

## 2013-02-14 DIAGNOSIS — G541 Lumbosacral plexus disorders: Secondary | ICD-10-CM | POA: Diagnosis not present

## 2013-03-13 DIAGNOSIS — I10 Essential (primary) hypertension: Secondary | ICD-10-CM | POA: Diagnosis not present

## 2013-03-13 DIAGNOSIS — F329 Major depressive disorder, single episode, unspecified: Secondary | ICD-10-CM | POA: Diagnosis not present

## 2013-03-13 DIAGNOSIS — E785 Hyperlipidemia, unspecified: Secondary | ICD-10-CM | POA: Diagnosis not present

## 2013-03-13 DIAGNOSIS — K589 Irritable bowel syndrome without diarrhea: Secondary | ICD-10-CM | POA: Diagnosis not present

## 2013-03-16 DIAGNOSIS — H81399 Other peripheral vertigo, unspecified ear: Secondary | ICD-10-CM | POA: Diagnosis not present

## 2013-03-16 DIAGNOSIS — G541 Lumbosacral plexus disorders: Secondary | ICD-10-CM | POA: Diagnosis not present

## 2013-03-16 DIAGNOSIS — H8190 Unspecified disorder of vestibular function, unspecified ear: Secondary | ICD-10-CM | POA: Diagnosis not present

## 2013-03-16 DIAGNOSIS — F341 Dysthymic disorder: Secondary | ICD-10-CM | POA: Diagnosis not present

## 2013-04-14 DIAGNOSIS — G541 Lumbosacral plexus disorders: Secondary | ICD-10-CM | POA: Diagnosis not present

## 2013-04-14 DIAGNOSIS — F341 Dysthymic disorder: Secondary | ICD-10-CM | POA: Diagnosis not present

## 2013-04-14 DIAGNOSIS — H81399 Other peripheral vertigo, unspecified ear: Secondary | ICD-10-CM | POA: Diagnosis not present

## 2013-04-14 DIAGNOSIS — H8190 Unspecified disorder of vestibular function, unspecified ear: Secondary | ICD-10-CM | POA: Diagnosis not present

## 2013-05-05 DIAGNOSIS — M545 Low back pain: Secondary | ICD-10-CM | POA: Diagnosis not present

## 2013-05-05 DIAGNOSIS — Z79899 Other long term (current) drug therapy: Secondary | ICD-10-CM | POA: Diagnosis not present

## 2013-05-05 DIAGNOSIS — M546 Pain in thoracic spine: Secondary | ICD-10-CM | POA: Diagnosis not present

## 2013-05-05 DIAGNOSIS — M542 Cervicalgia: Secondary | ICD-10-CM | POA: Diagnosis not present

## 2013-05-12 DIAGNOSIS — M542 Cervicalgia: Secondary | ICD-10-CM | POA: Diagnosis not present

## 2013-05-12 DIAGNOSIS — F341 Dysthymic disorder: Secondary | ICD-10-CM | POA: Diagnosis not present

## 2013-05-12 DIAGNOSIS — M545 Low back pain: Secondary | ICD-10-CM | POA: Diagnosis not present

## 2013-05-12 DIAGNOSIS — H81399 Other peripheral vertigo, unspecified ear: Secondary | ICD-10-CM | POA: Diagnosis not present

## 2013-05-12 DIAGNOSIS — M546 Pain in thoracic spine: Secondary | ICD-10-CM | POA: Diagnosis not present

## 2013-05-12 DIAGNOSIS — G541 Lumbosacral plexus disorders: Secondary | ICD-10-CM | POA: Diagnosis not present

## 2013-05-12 DIAGNOSIS — H8190 Unspecified disorder of vestibular function, unspecified ear: Secondary | ICD-10-CM | POA: Diagnosis not present

## 2013-05-12 DIAGNOSIS — Z79899 Other long term (current) drug therapy: Secondary | ICD-10-CM | POA: Diagnosis not present

## 2013-05-15 DIAGNOSIS — Z113 Encounter for screening for infections with a predominantly sexual mode of transmission: Secondary | ICD-10-CM | POA: Diagnosis not present

## 2013-05-15 DIAGNOSIS — Z1239 Encounter for other screening for malignant neoplasm of breast: Secondary | ICD-10-CM | POA: Diagnosis not present

## 2013-05-15 DIAGNOSIS — R5381 Other malaise: Secondary | ICD-10-CM | POA: Diagnosis not present

## 2013-05-15 DIAGNOSIS — Z0000 Encounter for general adult medical examination without abnormal findings: Secondary | ICD-10-CM | POA: Diagnosis not present

## 2013-05-15 DIAGNOSIS — I10 Essential (primary) hypertension: Secondary | ICD-10-CM | POA: Diagnosis not present

## 2013-05-15 DIAGNOSIS — E559 Vitamin D deficiency, unspecified: Secondary | ICD-10-CM | POA: Diagnosis not present

## 2013-05-15 DIAGNOSIS — R5383 Other fatigue: Secondary | ICD-10-CM | POA: Diagnosis not present

## 2013-05-15 DIAGNOSIS — Z124 Encounter for screening for malignant neoplasm of cervix: Secondary | ICD-10-CM | POA: Diagnosis not present

## 2013-05-15 DIAGNOSIS — Z1211 Encounter for screening for malignant neoplasm of colon: Secondary | ICD-10-CM | POA: Diagnosis not present

## 2013-05-15 DIAGNOSIS — E785 Hyperlipidemia, unspecified: Secondary | ICD-10-CM | POA: Diagnosis not present

## 2013-05-15 DIAGNOSIS — R300 Dysuria: Secondary | ICD-10-CM | POA: Diagnosis not present

## 2013-06-13 DIAGNOSIS — H81399 Other peripheral vertigo, unspecified ear: Secondary | ICD-10-CM | POA: Diagnosis not present

## 2013-06-13 DIAGNOSIS — M5020 Other cervical disc displacement, unspecified cervical region: Secondary | ICD-10-CM | POA: Diagnosis not present

## 2013-06-13 DIAGNOSIS — G541 Lumbosacral plexus disorders: Secondary | ICD-10-CM | POA: Diagnosis not present

## 2013-06-13 DIAGNOSIS — H8190 Unspecified disorder of vestibular function, unspecified ear: Secondary | ICD-10-CM | POA: Diagnosis not present

## 2013-06-23 DIAGNOSIS — E785 Hyperlipidemia, unspecified: Secondary | ICD-10-CM | POA: Diagnosis not present

## 2013-06-23 DIAGNOSIS — J449 Chronic obstructive pulmonary disease, unspecified: Secondary | ICD-10-CM | POA: Diagnosis not present

## 2013-06-23 DIAGNOSIS — E8881 Metabolic syndrome: Secondary | ICD-10-CM | POA: Diagnosis not present

## 2013-06-23 DIAGNOSIS — I10 Essential (primary) hypertension: Secondary | ICD-10-CM | POA: Diagnosis not present

## 2013-07-14 DIAGNOSIS — M5020 Other cervical disc displacement, unspecified cervical region: Secondary | ICD-10-CM | POA: Diagnosis not present

## 2013-07-14 DIAGNOSIS — Z79899 Other long term (current) drug therapy: Secondary | ICD-10-CM | POA: Diagnosis not present

## 2013-07-14 DIAGNOSIS — H8190 Unspecified disorder of vestibular function, unspecified ear: Secondary | ICD-10-CM | POA: Diagnosis not present

## 2013-07-14 DIAGNOSIS — H81399 Other peripheral vertigo, unspecified ear: Secondary | ICD-10-CM | POA: Diagnosis not present

## 2013-07-14 DIAGNOSIS — M542 Cervicalgia: Secondary | ICD-10-CM | POA: Diagnosis not present

## 2013-07-14 DIAGNOSIS — M546 Pain in thoracic spine: Secondary | ICD-10-CM | POA: Diagnosis not present

## 2013-07-14 DIAGNOSIS — M545 Low back pain: Secondary | ICD-10-CM | POA: Diagnosis not present

## 2013-07-14 DIAGNOSIS — M5430 Sciatica, unspecified side: Secondary | ICD-10-CM | POA: Diagnosis not present

## 2013-07-14 DIAGNOSIS — G541 Lumbosacral plexus disorders: Secondary | ICD-10-CM | POA: Diagnosis not present

## 2013-08-11 DIAGNOSIS — H81399 Other peripheral vertigo, unspecified ear: Secondary | ICD-10-CM | POA: Diagnosis not present

## 2013-08-11 DIAGNOSIS — M5020 Other cervical disc displacement, unspecified cervical region: Secondary | ICD-10-CM | POA: Diagnosis not present

## 2013-08-11 DIAGNOSIS — G541 Lumbosacral plexus disorders: Secondary | ICD-10-CM | POA: Diagnosis not present

## 2013-08-11 DIAGNOSIS — H8190 Unspecified disorder of vestibular function, unspecified ear: Secondary | ICD-10-CM | POA: Diagnosis not present

## 2013-09-08 DIAGNOSIS — G541 Lumbosacral plexus disorders: Secondary | ICD-10-CM | POA: Diagnosis not present

## 2013-09-08 DIAGNOSIS — H8190 Unspecified disorder of vestibular function, unspecified ear: Secondary | ICD-10-CM | POA: Diagnosis not present

## 2013-09-08 DIAGNOSIS — M5020 Other cervical disc displacement, unspecified cervical region: Secondary | ICD-10-CM | POA: Diagnosis not present

## 2013-09-08 DIAGNOSIS — H81399 Other peripheral vertigo, unspecified ear: Secondary | ICD-10-CM | POA: Diagnosis not present

## 2013-09-08 DIAGNOSIS — Z79899 Other long term (current) drug therapy: Secondary | ICD-10-CM | POA: Diagnosis not present

## 2013-09-08 DIAGNOSIS — M545 Low back pain: Secondary | ICD-10-CM | POA: Diagnosis not present

## 2013-09-08 DIAGNOSIS — M542 Cervicalgia: Secondary | ICD-10-CM | POA: Diagnosis not present

## 2013-09-26 DIAGNOSIS — G4700 Insomnia, unspecified: Secondary | ICD-10-CM | POA: Diagnosis not present

## 2013-09-26 DIAGNOSIS — E785 Hyperlipidemia, unspecified: Secondary | ICD-10-CM | POA: Diagnosis not present

## 2013-09-26 DIAGNOSIS — R300 Dysuria: Secondary | ICD-10-CM | POA: Diagnosis not present

## 2013-09-26 DIAGNOSIS — Z23 Encounter for immunization: Secondary | ICD-10-CM | POA: Diagnosis not present

## 2013-10-02 ENCOUNTER — Encounter: Payer: Self-pay | Admitting: *Deleted

## 2013-10-06 DIAGNOSIS — M5030 Other cervical disc degeneration, unspecified cervical region: Secondary | ICD-10-CM | POA: Diagnosis not present

## 2013-10-06 DIAGNOSIS — M542 Cervicalgia: Secondary | ICD-10-CM | POA: Diagnosis not present

## 2013-10-06 DIAGNOSIS — M79609 Pain in unspecified limb: Secondary | ICD-10-CM | POA: Diagnosis not present

## 2013-10-06 DIAGNOSIS — M545 Low back pain: Secondary | ICD-10-CM | POA: Diagnosis not present

## 2013-10-06 DIAGNOSIS — M961 Postlaminectomy syndrome, not elsewhere classified: Secondary | ICD-10-CM | POA: Diagnosis not present

## 2013-10-09 ENCOUNTER — Ambulatory Visit: Payer: Self-pay | Admitting: Podiatry

## 2013-11-03 DIAGNOSIS — M545 Low back pain, unspecified: Secondary | ICD-10-CM | POA: Diagnosis not present

## 2013-11-03 DIAGNOSIS — M961 Postlaminectomy syndrome, not elsewhere classified: Secondary | ICD-10-CM | POA: Diagnosis not present

## 2013-11-03 DIAGNOSIS — M79609 Pain in unspecified limb: Secondary | ICD-10-CM | POA: Diagnosis not present

## 2013-11-03 DIAGNOSIS — M5030 Other cervical disc degeneration, unspecified cervical region: Secondary | ICD-10-CM | POA: Diagnosis not present

## 2013-11-03 DIAGNOSIS — M542 Cervicalgia: Secondary | ICD-10-CM | POA: Diagnosis not present

## 2013-12-01 DIAGNOSIS — M79609 Pain in unspecified limb: Secondary | ICD-10-CM | POA: Diagnosis not present

## 2013-12-01 DIAGNOSIS — M542 Cervicalgia: Secondary | ICD-10-CM | POA: Diagnosis not present

## 2013-12-01 DIAGNOSIS — M545 Low back pain, unspecified: Secondary | ICD-10-CM | POA: Diagnosis not present

## 2013-12-01 DIAGNOSIS — M961 Postlaminectomy syndrome, not elsewhere classified: Secondary | ICD-10-CM | POA: Diagnosis not present

## 2013-12-01 DIAGNOSIS — M5030 Other cervical disc degeneration, unspecified cervical region: Secondary | ICD-10-CM | POA: Diagnosis not present

## 2013-12-27 DIAGNOSIS — G4700 Insomnia, unspecified: Secondary | ICD-10-CM | POA: Diagnosis not present

## 2013-12-27 DIAGNOSIS — F3289 Other specified depressive episodes: Secondary | ICD-10-CM | POA: Diagnosis not present

## 2013-12-27 DIAGNOSIS — E785 Hyperlipidemia, unspecified: Secondary | ICD-10-CM | POA: Diagnosis not present

## 2013-12-27 DIAGNOSIS — I10 Essential (primary) hypertension: Secondary | ICD-10-CM | POA: Diagnosis not present

## 2013-12-27 DIAGNOSIS — F329 Major depressive disorder, single episode, unspecified: Secondary | ICD-10-CM | POA: Diagnosis not present

## 2014-01-01 DIAGNOSIS — M79609 Pain in unspecified limb: Secondary | ICD-10-CM | POA: Diagnosis not present

## 2014-01-01 DIAGNOSIS — M961 Postlaminectomy syndrome, not elsewhere classified: Secondary | ICD-10-CM | POA: Diagnosis not present

## 2014-01-01 DIAGNOSIS — M545 Low back pain, unspecified: Secondary | ICD-10-CM | POA: Diagnosis not present

## 2014-01-01 DIAGNOSIS — Z79899 Other long term (current) drug therapy: Secondary | ICD-10-CM | POA: Diagnosis not present

## 2014-01-01 DIAGNOSIS — M5030 Other cervical disc degeneration, unspecified cervical region: Secondary | ICD-10-CM | POA: Diagnosis not present

## 2014-01-01 DIAGNOSIS — M542 Cervicalgia: Secondary | ICD-10-CM | POA: Diagnosis not present

## 2014-01-09 DIAGNOSIS — I10 Essential (primary) hypertension: Secondary | ICD-10-CM | POA: Diagnosis not present

## 2014-01-09 DIAGNOSIS — M2560 Stiffness of unspecified joint, not elsewhere classified: Secondary | ICD-10-CM | POA: Diagnosis not present

## 2014-01-09 DIAGNOSIS — E785 Hyperlipidemia, unspecified: Secondary | ICD-10-CM | POA: Diagnosis not present

## 2014-01-09 DIAGNOSIS — E8881 Metabolic syndrome: Secondary | ICD-10-CM | POA: Diagnosis not present

## 2014-02-01 DIAGNOSIS — M545 Low back pain, unspecified: Secondary | ICD-10-CM | POA: Diagnosis not present

## 2014-02-01 DIAGNOSIS — Z79899 Other long term (current) drug therapy: Secondary | ICD-10-CM | POA: Diagnosis not present

## 2014-02-01 DIAGNOSIS — M542 Cervicalgia: Secondary | ICD-10-CM | POA: Diagnosis not present

## 2014-02-01 DIAGNOSIS — M533 Sacrococcygeal disorders, not elsewhere classified: Secondary | ICD-10-CM | POA: Diagnosis not present

## 2014-02-08 ENCOUNTER — Emergency Department: Payer: Self-pay | Admitting: Emergency Medicine

## 2014-02-08 DIAGNOSIS — F17200 Nicotine dependence, unspecified, uncomplicated: Secondary | ICD-10-CM | POA: Diagnosis not present

## 2014-02-08 DIAGNOSIS — Z9889 Other specified postprocedural states: Secondary | ICD-10-CM | POA: Diagnosis not present

## 2014-02-08 DIAGNOSIS — I10 Essential (primary) hypertension: Secondary | ICD-10-CM | POA: Diagnosis not present

## 2014-02-08 DIAGNOSIS — M5430 Sciatica, unspecified side: Secondary | ICD-10-CM | POA: Diagnosis not present

## 2014-02-14 DIAGNOSIS — M533 Sacrococcygeal disorders, not elsewhere classified: Secondary | ICD-10-CM | POA: Diagnosis not present

## 2014-02-14 DIAGNOSIS — M5137 Other intervertebral disc degeneration, lumbosacral region: Secondary | ICD-10-CM | POA: Diagnosis not present

## 2014-02-14 DIAGNOSIS — M545 Low back pain, unspecified: Secondary | ICD-10-CM | POA: Diagnosis not present

## 2014-02-14 DIAGNOSIS — Z79899 Other long term (current) drug therapy: Secondary | ICD-10-CM | POA: Diagnosis not present

## 2014-02-14 DIAGNOSIS — M542 Cervicalgia: Secondary | ICD-10-CM | POA: Diagnosis not present

## 2014-02-27 ENCOUNTER — Emergency Department: Payer: Self-pay | Admitting: Emergency Medicine

## 2014-02-27 DIAGNOSIS — S8000XA Contusion of unspecified knee, initial encounter: Secondary | ICD-10-CM | POA: Diagnosis not present

## 2014-02-27 DIAGNOSIS — S40019A Contusion of unspecified shoulder, initial encounter: Secondary | ICD-10-CM | POA: Diagnosis not present

## 2014-03-15 DIAGNOSIS — M545 Low back pain, unspecified: Secondary | ICD-10-CM | POA: Diagnosis not present

## 2014-03-15 DIAGNOSIS — Z79899 Other long term (current) drug therapy: Secondary | ICD-10-CM | POA: Diagnosis not present

## 2014-03-15 DIAGNOSIS — M5412 Radiculopathy, cervical region: Secondary | ICD-10-CM | POA: Diagnosis not present

## 2014-03-15 DIAGNOSIS — G894 Chronic pain syndrome: Secondary | ICD-10-CM | POA: Diagnosis not present

## 2014-03-15 DIAGNOSIS — M533 Sacrococcygeal disorders, not elsewhere classified: Secondary | ICD-10-CM | POA: Diagnosis not present

## 2014-03-15 DIAGNOSIS — M542 Cervicalgia: Secondary | ICD-10-CM | POA: Diagnosis not present

## 2014-03-15 DIAGNOSIS — M5430 Sciatica, unspecified side: Secondary | ICD-10-CM | POA: Diagnosis not present

## 2014-04-16 DIAGNOSIS — M5137 Other intervertebral disc degeneration, lumbosacral region: Secondary | ICD-10-CM | POA: Diagnosis not present

## 2014-04-16 DIAGNOSIS — G541 Lumbosacral plexus disorders: Secondary | ICD-10-CM | POA: Diagnosis not present

## 2014-04-16 DIAGNOSIS — F519 Sleep disorder not due to a substance or known physiological condition, unspecified: Secondary | ICD-10-CM | POA: Diagnosis not present

## 2014-04-16 DIAGNOSIS — M5412 Radiculopathy, cervical region: Secondary | ICD-10-CM | POA: Diagnosis not present

## 2014-04-16 DIAGNOSIS — G540 Brachial plexus disorders: Secondary | ICD-10-CM | POA: Diagnosis not present

## 2014-04-16 DIAGNOSIS — G894 Chronic pain syndrome: Secondary | ICD-10-CM | POA: Diagnosis not present

## 2014-04-16 DIAGNOSIS — Z79899 Other long term (current) drug therapy: Secondary | ICD-10-CM | POA: Diagnosis not present

## 2014-04-16 DIAGNOSIS — M533 Sacrococcygeal disorders, not elsewhere classified: Secondary | ICD-10-CM | POA: Diagnosis not present

## 2014-04-16 DIAGNOSIS — M542 Cervicalgia: Secondary | ICD-10-CM | POA: Diagnosis not present

## 2014-04-16 DIAGNOSIS — M545 Low back pain, unspecified: Secondary | ICD-10-CM | POA: Diagnosis not present

## 2014-04-23 DIAGNOSIS — F329 Major depressive disorder, single episode, unspecified: Secondary | ICD-10-CM | POA: Diagnosis not present

## 2014-04-23 DIAGNOSIS — G4700 Insomnia, unspecified: Secondary | ICD-10-CM | POA: Diagnosis not present

## 2014-04-23 DIAGNOSIS — F3289 Other specified depressive episodes: Secondary | ICD-10-CM | POA: Diagnosis not present

## 2014-04-23 DIAGNOSIS — J449 Chronic obstructive pulmonary disease, unspecified: Secondary | ICD-10-CM | POA: Diagnosis not present

## 2014-04-23 DIAGNOSIS — K219 Gastro-esophageal reflux disease without esophagitis: Secondary | ICD-10-CM | POA: Diagnosis not present

## 2014-05-14 DIAGNOSIS — Z79899 Other long term (current) drug therapy: Secondary | ICD-10-CM | POA: Diagnosis not present

## 2014-05-14 DIAGNOSIS — G894 Chronic pain syndrome: Secondary | ICD-10-CM | POA: Diagnosis not present

## 2014-05-14 DIAGNOSIS — M545 Low back pain, unspecified: Secondary | ICD-10-CM | POA: Diagnosis not present

## 2014-05-14 DIAGNOSIS — M542 Cervicalgia: Secondary | ICD-10-CM | POA: Diagnosis not present

## 2014-05-14 DIAGNOSIS — M5137 Other intervertebral disc degeneration, lumbosacral region: Secondary | ICD-10-CM | POA: Diagnosis not present

## 2014-05-14 DIAGNOSIS — M533 Sacrococcygeal disorders, not elsewhere classified: Secondary | ICD-10-CM | POA: Diagnosis not present

## 2014-05-25 DIAGNOSIS — N644 Mastodynia: Secondary | ICD-10-CM | POA: Diagnosis not present

## 2014-06-01 ENCOUNTER — Ambulatory Visit: Payer: Self-pay | Admitting: Family Medicine

## 2014-06-01 DIAGNOSIS — Z1231 Encounter for screening mammogram for malignant neoplasm of breast: Secondary | ICD-10-CM | POA: Diagnosis not present

## 2014-06-12 DIAGNOSIS — M5137 Other intervertebral disc degeneration, lumbosacral region: Secondary | ICD-10-CM | POA: Diagnosis not present

## 2014-06-12 DIAGNOSIS — M533 Sacrococcygeal disorders, not elsewhere classified: Secondary | ICD-10-CM | POA: Diagnosis not present

## 2014-06-12 DIAGNOSIS — M545 Low back pain, unspecified: Secondary | ICD-10-CM | POA: Diagnosis not present

## 2014-07-12 DIAGNOSIS — M533 Sacrococcygeal disorders, not elsewhere classified: Secondary | ICD-10-CM | POA: Diagnosis not present

## 2014-07-12 DIAGNOSIS — S335XXA Sprain of ligaments of lumbar spine, initial encounter: Secondary | ICD-10-CM | POA: Diagnosis not present

## 2014-07-12 DIAGNOSIS — M545 Low back pain, unspecified: Secondary | ICD-10-CM | POA: Diagnosis not present

## 2014-07-12 DIAGNOSIS — M542 Cervicalgia: Secondary | ICD-10-CM | POA: Diagnosis not present

## 2014-07-12 DIAGNOSIS — G894 Chronic pain syndrome: Secondary | ICD-10-CM | POA: Diagnosis not present

## 2014-07-12 DIAGNOSIS — M5137 Other intervertebral disc degeneration, lumbosacral region: Secondary | ICD-10-CM | POA: Diagnosis not present

## 2014-07-12 DIAGNOSIS — Z79899 Other long term (current) drug therapy: Secondary | ICD-10-CM | POA: Diagnosis not present

## 2014-07-27 DIAGNOSIS — F3289 Other specified depressive episodes: Secondary | ICD-10-CM | POA: Diagnosis not present

## 2014-07-27 DIAGNOSIS — K589 Irritable bowel syndrome without diarrhea: Secondary | ICD-10-CM | POA: Diagnosis not present

## 2014-07-27 DIAGNOSIS — M545 Low back pain, unspecified: Secondary | ICD-10-CM | POA: Diagnosis not present

## 2014-07-27 DIAGNOSIS — G8929 Other chronic pain: Secondary | ICD-10-CM | POA: Diagnosis not present

## 2014-07-27 DIAGNOSIS — G4700 Insomnia, unspecified: Secondary | ICD-10-CM | POA: Diagnosis not present

## 2014-07-27 DIAGNOSIS — I10 Essential (primary) hypertension: Secondary | ICD-10-CM | POA: Diagnosis not present

## 2014-07-27 DIAGNOSIS — F329 Major depressive disorder, single episode, unspecified: Secondary | ICD-10-CM | POA: Diagnosis not present

## 2014-07-27 DIAGNOSIS — M79609 Pain in unspecified limb: Secondary | ICD-10-CM | POA: Diagnosis not present

## 2014-07-27 DIAGNOSIS — Z23 Encounter for immunization: Secondary | ICD-10-CM | POA: Diagnosis not present

## 2014-07-27 DIAGNOSIS — R52 Pain, unspecified: Secondary | ICD-10-CM | POA: Diagnosis not present

## 2014-08-13 DIAGNOSIS — M5413 Radiculopathy, cervicothoracic region: Secondary | ICD-10-CM | POA: Diagnosis not present

## 2014-08-13 DIAGNOSIS — M25511 Pain in right shoulder: Secondary | ICD-10-CM | POA: Diagnosis not present

## 2014-08-13 DIAGNOSIS — M545 Low back pain: Secondary | ICD-10-CM | POA: Diagnosis not present

## 2014-08-13 DIAGNOSIS — M25512 Pain in left shoulder: Secondary | ICD-10-CM | POA: Diagnosis not present

## 2014-08-13 DIAGNOSIS — R202 Paresthesia of skin: Secondary | ICD-10-CM | POA: Diagnosis not present

## 2014-08-13 DIAGNOSIS — M5416 Radiculopathy, lumbar region: Secondary | ICD-10-CM | POA: Diagnosis not present

## 2014-08-13 DIAGNOSIS — M542 Cervicalgia: Secondary | ICD-10-CM | POA: Diagnosis not present

## 2014-08-13 DIAGNOSIS — G8929 Other chronic pain: Secondary | ICD-10-CM | POA: Diagnosis not present

## 2014-08-29 ENCOUNTER — Emergency Department: Payer: Self-pay | Admitting: Emergency Medicine

## 2014-08-29 DIAGNOSIS — J069 Acute upper respiratory infection, unspecified: Secondary | ICD-10-CM | POA: Diagnosis not present

## 2014-08-29 DIAGNOSIS — I10 Essential (primary) hypertension: Secondary | ICD-10-CM | POA: Diagnosis not present

## 2014-08-29 DIAGNOSIS — Z720 Tobacco use: Secondary | ICD-10-CM | POA: Diagnosis not present

## 2014-08-29 DIAGNOSIS — J441 Chronic obstructive pulmonary disease with (acute) exacerbation: Secondary | ICD-10-CM | POA: Diagnosis not present

## 2014-09-05 DIAGNOSIS — H2513 Age-related nuclear cataract, bilateral: Secondary | ICD-10-CM | POA: Diagnosis not present

## 2014-09-12 DIAGNOSIS — M79651 Pain in right thigh: Secondary | ICD-10-CM | POA: Diagnosis not present

## 2014-09-12 DIAGNOSIS — M79602 Pain in left arm: Secondary | ICD-10-CM | POA: Diagnosis not present

## 2014-09-12 DIAGNOSIS — M25552 Pain in left hip: Secondary | ICD-10-CM | POA: Diagnosis not present

## 2014-09-12 DIAGNOSIS — M79641 Pain in right hand: Secondary | ICD-10-CM | POA: Diagnosis not present

## 2014-09-12 DIAGNOSIS — M79652 Pain in left thigh: Secondary | ICD-10-CM | POA: Diagnosis not present

## 2014-09-12 DIAGNOSIS — Z79891 Long term (current) use of opiate analgesic: Secondary | ICD-10-CM | POA: Diagnosis not present

## 2014-09-12 DIAGNOSIS — M545 Low back pain: Secondary | ICD-10-CM | POA: Diagnosis not present

## 2014-09-12 DIAGNOSIS — M79601 Pain in right arm: Secondary | ICD-10-CM | POA: Diagnosis not present

## 2014-09-12 DIAGNOSIS — M79642 Pain in left hand: Secondary | ICD-10-CM | POA: Diagnosis not present

## 2014-09-12 DIAGNOSIS — G8929 Other chronic pain: Secondary | ICD-10-CM | POA: Diagnosis not present

## 2014-09-17 DIAGNOSIS — M25562 Pain in left knee: Secondary | ICD-10-CM | POA: Diagnosis not present

## 2014-09-17 DIAGNOSIS — M25561 Pain in right knee: Secondary | ICD-10-CM | POA: Diagnosis not present

## 2014-09-17 DIAGNOSIS — M542 Cervicalgia: Secondary | ICD-10-CM | POA: Diagnosis not present

## 2014-09-17 DIAGNOSIS — G8929 Other chronic pain: Secondary | ICD-10-CM | POA: Diagnosis not present

## 2014-09-17 DIAGNOSIS — M79621 Pain in right upper arm: Secondary | ICD-10-CM | POA: Diagnosis not present

## 2014-09-17 DIAGNOSIS — M79622 Pain in left upper arm: Secondary | ICD-10-CM | POA: Diagnosis not present

## 2014-09-17 DIAGNOSIS — M25512 Pain in left shoulder: Secondary | ICD-10-CM | POA: Diagnosis not present

## 2014-09-17 DIAGNOSIS — M25511 Pain in right shoulder: Secondary | ICD-10-CM | POA: Diagnosis not present

## 2014-10-09 DIAGNOSIS — G8929 Other chronic pain: Secondary | ICD-10-CM | POA: Diagnosis not present

## 2014-10-12 DIAGNOSIS — M545 Low back pain: Secondary | ICD-10-CM | POA: Diagnosis not present

## 2014-10-12 DIAGNOSIS — G8929 Other chronic pain: Secondary | ICD-10-CM | POA: Diagnosis not present

## 2014-10-12 DIAGNOSIS — L259 Unspecified contact dermatitis, unspecified cause: Secondary | ICD-10-CM | POA: Diagnosis not present

## 2014-10-12 DIAGNOSIS — E785 Hyperlipidemia, unspecified: Secondary | ICD-10-CM | POA: Diagnosis not present

## 2014-10-12 DIAGNOSIS — F329 Major depressive disorder, single episode, unspecified: Secondary | ICD-10-CM | POA: Diagnosis not present

## 2014-10-12 DIAGNOSIS — K580 Irritable bowel syndrome with diarrhea: Secondary | ICD-10-CM | POA: Diagnosis not present

## 2014-10-12 DIAGNOSIS — G4700 Insomnia, unspecified: Secondary | ICD-10-CM | POA: Diagnosis not present

## 2014-10-12 DIAGNOSIS — I10 Essential (primary) hypertension: Secondary | ICD-10-CM | POA: Diagnosis not present

## 2014-11-07 DIAGNOSIS — M79604 Pain in right leg: Secondary | ICD-10-CM | POA: Diagnosis not present

## 2014-11-07 DIAGNOSIS — M79652 Pain in left thigh: Secondary | ICD-10-CM | POA: Diagnosis not present

## 2014-11-07 DIAGNOSIS — G8929 Other chronic pain: Secondary | ICD-10-CM | POA: Diagnosis not present

## 2014-11-07 DIAGNOSIS — M79605 Pain in left leg: Secondary | ICD-10-CM | POA: Diagnosis not present

## 2014-11-07 DIAGNOSIS — Z79891 Long term (current) use of opiate analgesic: Secondary | ICD-10-CM | POA: Diagnosis not present

## 2014-11-07 DIAGNOSIS — M545 Low back pain: Secondary | ICD-10-CM | POA: Diagnosis not present

## 2014-11-07 DIAGNOSIS — M79651 Pain in right thigh: Secondary | ICD-10-CM | POA: Diagnosis not present

## 2014-12-05 DIAGNOSIS — M25511 Pain in right shoulder: Secondary | ICD-10-CM | POA: Diagnosis not present

## 2014-12-05 DIAGNOSIS — M542 Cervicalgia: Secondary | ICD-10-CM | POA: Diagnosis not present

## 2014-12-05 DIAGNOSIS — M25551 Pain in right hip: Secondary | ICD-10-CM | POA: Diagnosis not present

## 2014-12-05 DIAGNOSIS — M25512 Pain in left shoulder: Secondary | ICD-10-CM | POA: Diagnosis not present

## 2014-12-05 DIAGNOSIS — M25552 Pain in left hip: Secondary | ICD-10-CM | POA: Diagnosis not present

## 2014-12-05 DIAGNOSIS — M545 Low back pain: Secondary | ICD-10-CM | POA: Diagnosis not present

## 2015-01-03 DIAGNOSIS — M542 Cervicalgia: Secondary | ICD-10-CM | POA: Diagnosis not present

## 2015-01-03 DIAGNOSIS — M25552 Pain in left hip: Secondary | ICD-10-CM | POA: Diagnosis not present

## 2015-01-03 DIAGNOSIS — M25551 Pain in right hip: Secondary | ICD-10-CM | POA: Diagnosis not present

## 2015-01-03 DIAGNOSIS — M25511 Pain in right shoulder: Secondary | ICD-10-CM | POA: Diagnosis not present

## 2015-01-03 DIAGNOSIS — M25512 Pain in left shoulder: Secondary | ICD-10-CM | POA: Diagnosis not present

## 2015-01-03 DIAGNOSIS — M79604 Pain in right leg: Secondary | ICD-10-CM | POA: Diagnosis not present

## 2015-01-03 DIAGNOSIS — G8929 Other chronic pain: Secondary | ICD-10-CM | POA: Diagnosis not present

## 2015-01-03 DIAGNOSIS — M545 Low back pain: Secondary | ICD-10-CM | POA: Diagnosis not present

## 2015-01-14 DIAGNOSIS — I10 Essential (primary) hypertension: Secondary | ICD-10-CM | POA: Diagnosis not present

## 2015-01-14 DIAGNOSIS — L259 Unspecified contact dermatitis, unspecified cause: Secondary | ICD-10-CM | POA: Diagnosis not present

## 2015-01-14 DIAGNOSIS — N939 Abnormal uterine and vaginal bleeding, unspecified: Secondary | ICD-10-CM | POA: Diagnosis not present

## 2015-01-14 DIAGNOSIS — Z716 Tobacco abuse counseling: Secondary | ICD-10-CM | POA: Diagnosis not present

## 2015-01-14 DIAGNOSIS — Z8742 Personal history of other diseases of the female genital tract: Secondary | ICD-10-CM | POA: Insufficient documentation

## 2015-01-14 DIAGNOSIS — G4700 Insomnia, unspecified: Secondary | ICD-10-CM | POA: Diagnosis not present

## 2015-01-14 DIAGNOSIS — Z720 Tobacco use: Secondary | ICD-10-CM | POA: Diagnosis not present

## 2015-01-14 DIAGNOSIS — J449 Chronic obstructive pulmonary disease, unspecified: Secondary | ICD-10-CM | POA: Diagnosis not present

## 2015-01-14 DIAGNOSIS — Z113 Encounter for screening for infections with a predominantly sexual mode of transmission: Secondary | ICD-10-CM | POA: Diagnosis not present

## 2015-01-14 DIAGNOSIS — R739 Hyperglycemia, unspecified: Secondary | ICD-10-CM | POA: Diagnosis not present

## 2015-01-14 DIAGNOSIS — E785 Hyperlipidemia, unspecified: Secondary | ICD-10-CM | POA: Diagnosis not present

## 2015-01-15 DIAGNOSIS — Z719 Counseling, unspecified: Secondary | ICD-10-CM | POA: Diagnosis not present

## 2015-01-15 DIAGNOSIS — Z9181 History of falling: Secondary | ICD-10-CM | POA: Diagnosis not present

## 2015-01-15 DIAGNOSIS — N950 Postmenopausal bleeding: Secondary | ICD-10-CM | POA: Diagnosis not present

## 2015-01-15 DIAGNOSIS — Z124 Encounter for screening for malignant neoplasm of cervix: Secondary | ICD-10-CM | POA: Diagnosis not present

## 2015-01-15 DIAGNOSIS — Z0000 Encounter for general adult medical examination without abnormal findings: Secondary | ICD-10-CM | POA: Diagnosis not present

## 2015-01-15 DIAGNOSIS — Z1211 Encounter for screening for malignant neoplasm of colon: Secondary | ICD-10-CM | POA: Diagnosis not present

## 2015-01-15 DIAGNOSIS — Z1239 Encounter for other screening for malignant neoplasm of breast: Secondary | ICD-10-CM | POA: Diagnosis not present

## 2015-01-15 DIAGNOSIS — R739 Hyperglycemia, unspecified: Secondary | ICD-10-CM | POA: Diagnosis not present

## 2015-01-15 DIAGNOSIS — Z1389 Encounter for screening for other disorder: Secondary | ICD-10-CM | POA: Diagnosis not present

## 2015-01-15 DIAGNOSIS — N939 Abnormal uterine and vaginal bleeding, unspecified: Secondary | ICD-10-CM | POA: Diagnosis not present

## 2015-01-15 DIAGNOSIS — Z1151 Encounter for screening for human papillomavirus (HPV): Secondary | ICD-10-CM | POA: Diagnosis not present

## 2015-01-15 DIAGNOSIS — Z113 Encounter for screening for infections with a predominantly sexual mode of transmission: Secondary | ICD-10-CM | POA: Diagnosis not present

## 2015-01-15 DIAGNOSIS — I10 Essential (primary) hypertension: Secondary | ICD-10-CM | POA: Diagnosis not present

## 2015-01-15 DIAGNOSIS — E785 Hyperlipidemia, unspecified: Secondary | ICD-10-CM | POA: Diagnosis not present

## 2015-01-30 DIAGNOSIS — N950 Postmenopausal bleeding: Secondary | ICD-10-CM | POA: Diagnosis not present

## 2015-02-01 DIAGNOSIS — M25552 Pain in left hip: Secondary | ICD-10-CM | POA: Diagnosis not present

## 2015-02-01 DIAGNOSIS — M542 Cervicalgia: Secondary | ICD-10-CM | POA: Diagnosis not present

## 2015-02-01 DIAGNOSIS — M25551 Pain in right hip: Secondary | ICD-10-CM | POA: Diagnosis not present

## 2015-02-01 DIAGNOSIS — M545 Low back pain: Secondary | ICD-10-CM | POA: Diagnosis not present

## 2015-02-01 DIAGNOSIS — G8929 Other chronic pain: Secondary | ICD-10-CM | POA: Diagnosis not present

## 2015-02-16 ENCOUNTER — Ambulatory Visit
Admit: 2015-02-16 | Disposition: A | Discharge status: Home / Self Care | Payer: Self-pay | Attending: Family Medicine | Admitting: Family Medicine

## 2015-02-16 ENCOUNTER — Ambulatory Visit: Admit: 2015-02-16 | Disposition: A | Discharge status: Home / Self Care | Payer: Self-pay

## 2015-02-16 DIAGNOSIS — M5080 Other cervical disc disorders, unspecified cervical region: Secondary | ICD-10-CM | POA: Diagnosis not present

## 2015-02-16 DIAGNOSIS — Z981 Arthrodesis status: Secondary | ICD-10-CM | POA: Diagnosis not present

## 2015-02-16 DIAGNOSIS — M47817 Spondylosis without myelopathy or radiculopathy, lumbosacral region: Secondary | ICD-10-CM | POA: Diagnosis not present

## 2015-02-16 DIAGNOSIS — M5032 Other cervical disc degeneration, mid-cervical region: Secondary | ICD-10-CM | POA: Diagnosis not present

## 2015-02-16 DIAGNOSIS — M5126 Other intervertebral disc displacement, lumbar region: Secondary | ICD-10-CM | POA: Diagnosis not present

## 2015-02-16 LAB — CREATININE, SERUM
Creatinine: 0.7 mg/dL
EGFR (African American): >60
EGFR (Non-African Amer.): >60

## 2015-02-18 DIAGNOSIS — N950 Postmenopausal bleeding: Secondary | ICD-10-CM | POA: Diagnosis not present

## 2015-02-18 DIAGNOSIS — N882 Stricture and stenosis of cervix uteri: Secondary | ICD-10-CM | POA: Diagnosis not present

## 2015-03-04 DIAGNOSIS — M542 Cervicalgia: Secondary | ICD-10-CM | POA: Diagnosis not present

## 2015-03-04 DIAGNOSIS — M25512 Pain in left shoulder: Secondary | ICD-10-CM | POA: Diagnosis not present

## 2015-03-04 DIAGNOSIS — G890 Central pain syndrome: Secondary | ICD-10-CM | POA: Diagnosis not present

## 2015-03-04 DIAGNOSIS — G8929 Other chronic pain: Secondary | ICD-10-CM | POA: Diagnosis not present

## 2015-03-04 DIAGNOSIS — M545 Low back pain: Secondary | ICD-10-CM | POA: Diagnosis not present

## 2015-03-04 DIAGNOSIS — M25511 Pain in right shoulder: Secondary | ICD-10-CM | POA: Diagnosis not present

## 2015-03-14 ENCOUNTER — Encounter
Admission: RE | Admit: 2015-03-14 | Discharge: 2015-03-14 | Disposition: A | Discharge status: Home / Self Care | Payer: Medicare Other | Source: Ambulatory Visit | Attending: Obstetrics and Gynecology | Admitting: Obstetrics and Gynecology

## 2015-03-14 DIAGNOSIS — I10 Essential (primary) hypertension: Secondary | ICD-10-CM | POA: Diagnosis not present

## 2015-03-14 DIAGNOSIS — Z01810 Encounter for preprocedural cardiovascular examination: Secondary | ICD-10-CM | POA: Diagnosis not present

## 2015-03-14 DIAGNOSIS — N882 Stricture and stenosis of cervix uteri: Secondary | ICD-10-CM | POA: Insufficient documentation

## 2015-03-14 DIAGNOSIS — Z01812 Encounter for preprocedural laboratory examination: Secondary | ICD-10-CM | POA: Insufficient documentation

## 2015-03-14 DIAGNOSIS — N950 Postmenopausal bleeding: Secondary | ICD-10-CM | POA: Diagnosis not present

## 2015-03-14 LAB — CBC
HEMATOCRIT: 38.2 % (ref 35.0–47.0)
Hemoglobin: 12.6 g/dL (ref 12.0–16.0)
MCH: 31.1 pg (ref 26.0–34.0)
MCHC: 33.1 g/dL (ref 32.0–36.0)
MCV: 94 fL (ref 80.0–100.0)
PLATELETS: 217 10*3/uL (ref 150–440)
RBC: 4.06 MIL/uL (ref 3.80–5.20)
RDW: 13.5 % (ref 11.5–14.5)
WBC: 8.3 10*3/uL (ref 3.6–11.0)

## 2015-03-14 NOTE — Patient Instructions (Addendum)
  Your procedure is scheduled on: 03/21/15 Report to Day Surgery. To find out your arrival time please call [phone] between 1PM - 3PM on 03/20/15.  Remember: Instructions that are not followed completely may result in serious medical risk, up to and including death, or upon the discretion of your surgeon and anesthesiologist your surgery may need to be rescheduled.    __x__ 1. Do not eat food or drink liquids after midnight. No gum chewing or hard candies.     __x__ 2. No Alcohol for 24 hours before or after surgery.   ____ 3. Bring all medications with you on the day of surgery if instructed.    __x__ 4. Notify your doctor if there is any change in your medical condition     (cold, fever, infections).     Do not wear jewelry, make-up, hairpins, clips or nail polish.  Do not wear lotions, powders, or perfumes. You may wear deodorant.  Do not shave 48 hours prior to surgery. Men may shave face and neck.  Do not bring valuables to the hospital.    [facility] is not responsible for any belongings or valuables.               Contacts, dentures or bridgework may not be worn into surgery.  Leave your suitcase in the car. After surgery it may be brought to your room.  For patients admitted to the hospital, discharge time is determined by your                treatment team.   Patients discharged the day of surgery will not be allowed to drive home.   Please read over the following fact sheets that you were given:      ____ Take these medicines the morning of surgery with A SIP OF WATER:    1. xanax  2. Amlodipine  3. losartan   4.percocet  5.  6.  ____ Fleet Enema (as directed)   ____ Use CHG Soap as directed  __x__ Use inhalers on the day of surgery Spiriva  ____ Stop metformin 2 days prior to surgery    ____ Take 1/2 of usual insulin dose the night before surgery and none on the morning of surgery.   ____ Stop Coumadin/Plavix/aspirin on  ____ Stop  Anti-inflammatories on    ____ Stop supplements until after surgery.    ____ Bring C-Pap to the hospital.

## 2015-03-21 ENCOUNTER — Ambulatory Visit: Payer: Medicare Other | Admitting: Anesthesiology

## 2015-03-21 ENCOUNTER — Encounter: Payer: Self-pay | Admitting: *Deleted

## 2015-03-21 ENCOUNTER — Encounter
Admission: RE | Disposition: A | Discharge status: Home / Self Care | Payer: Self-pay | Source: Ambulatory Visit | Attending: Obstetrics and Gynecology

## 2015-03-21 ENCOUNTER — Ambulatory Visit
Admission: RE | Admit: 2015-03-21 | Discharge: 2015-03-21 | Disposition: A | Discharge status: Home / Self Care | Payer: Medicare Other | Source: Ambulatory Visit | Attending: Obstetrics and Gynecology | Admitting: Obstetrics and Gynecology

## 2015-03-21 DIAGNOSIS — Z79899 Other long term (current) drug therapy: Secondary | ICD-10-CM | POA: Diagnosis not present

## 2015-03-21 DIAGNOSIS — N950 Postmenopausal bleeding: Secondary | ICD-10-CM | POA: Insufficient documentation

## 2015-03-21 DIAGNOSIS — K589 Irritable bowel syndrome without diarrhea: Secondary | ICD-10-CM | POA: Insufficient documentation

## 2015-03-21 DIAGNOSIS — J449 Chronic obstructive pulmonary disease, unspecified: Secondary | ICD-10-CM | POA: Diagnosis not present

## 2015-03-21 DIAGNOSIS — F17200 Nicotine dependence, unspecified, uncomplicated: Secondary | ICD-10-CM | POA: Diagnosis not present

## 2015-03-21 DIAGNOSIS — N920 Excessive and frequent menstruation with regular cycle: Secondary | ICD-10-CM | POA: Diagnosis not present

## 2015-03-21 DIAGNOSIS — N882 Stricture and stenosis of cervix uteri: Secondary | ICD-10-CM | POA: Diagnosis not present

## 2015-03-21 DIAGNOSIS — I10 Essential (primary) hypertension: Secondary | ICD-10-CM | POA: Insufficient documentation

## 2015-03-21 HISTORY — PX: DILATATION & CURETTAGE/HYSTEROSCOPY WITH MYOSURE: SHX6511

## 2015-03-21 LAB — POCT PREGNANCY, URINE: PREG TEST UR: NEGATIVE

## 2015-03-21 SURGERY — DILATATION & CURETTAGE/HYSTEROSCOPY WITH MYOSURE
Anesthesia: General | Wound class: Clean Contaminated

## 2015-03-21 MED ORDER — LACTATED RINGERS IV SOLN
INTRAVENOUS | Status: DC
  Administered 2015-03-21: 13:00:00 via INTRAVENOUS

## 2015-03-21 MED ORDER — OXYCODONE-ACETAMINOPHEN 5-325 MG PO TABS
1.0000 | ORAL_TABLET | ORAL | Status: DC | PRN
  Administered 2015-03-21 (×2): 1 via ORAL

## 2015-03-21 MED ORDER — FENTANYL CITRATE (PF) 100 MCG/2ML IJ SOLN
25.0000 ug | INTRAMUSCULAR | Status: DC | PRN
  Administered 2015-03-21 (×4): 25 ug via INTRAVENOUS

## 2015-03-21 MED ORDER — FAMOTIDINE 20 MG PO TABS
20.0000 mg | ORAL_TABLET | Freq: Once | ORAL | Status: AC
  Administered 2015-03-21: 20 mg via ORAL

## 2015-03-21 MED ORDER — DOCUSATE SODIUM 100 MG PO CAPS: 100.0000 mg | ORAL_CAPSULE | Freq: Two times a day (BID) | ORAL | Status: DC

## 2015-03-21 MED ORDER — KETOROLAC TROMETHAMINE 30 MG/ML IJ SOLN
INTRAMUSCULAR | Status: DC | PRN
  Administered 2015-03-21: 30 mg via INTRAVENOUS

## 2015-03-21 MED ORDER — LACTATED RINGERS IV SOLN: INTRAVENOUS | Status: DC

## 2015-03-21 MED ORDER — ONDANSETRON HCL 4 MG/2ML IJ SOLN: 4.0000 mg | Freq: Four times a day (QID) | INTRAMUSCULAR | Status: DC | PRN

## 2015-03-21 MED ORDER — ONDANSETRON HCL 4 MG PO TABS
4.0000 mg | ORAL_TABLET | Freq: Four times a day (QID) | ORAL | Status: DC | PRN
  Filled 2015-03-21: qty 1

## 2015-03-21 MED ORDER — LABETALOL HCL 5 MG/ML IV SOLN
10.0000 mg | Freq: Once | INTRAVENOUS | Status: DC
  Filled 2015-03-21: qty 4

## 2015-03-21 MED ORDER — GLYCOPYRROLATE 0.2 MG/ML IJ SOLN
INTRAMUSCULAR | Status: DC | PRN
  Administered 2015-03-21: 0.2 mg via INTRAVENOUS

## 2015-03-21 MED ORDER — FENTANYL CITRATE (PF) 100 MCG/2ML IJ SOLN
INTRAMUSCULAR | Status: DC | PRN
  Administered 2015-03-21 (×4): 50 ug via INTRAVENOUS

## 2015-03-21 MED ORDER — PROPOFOL 10 MG/ML IV BOLUS
INTRAVENOUS | Status: DC | PRN
  Administered 2015-03-21: 150 mg via INTRAVENOUS

## 2015-03-21 MED ORDER — OXYCODONE-ACETAMINOPHEN 5-325 MG PO TABS: 1.0000 | ORAL_TABLET | Freq: Four times a day (QID) | ORAL | Status: DC | PRN

## 2015-03-21 MED ORDER — OXYCODONE-ACETAMINOPHEN 5-325 MG PO TABS
ORAL_TABLET | ORAL | Status: DC
Start: 2015-03-21 — End: 2015-03-21
  Filled 2015-03-21: qty 2

## 2015-03-21 MED ORDER — LIDOCAINE HCL (PF) 1 % IJ SOLN
INTRAMUSCULAR | Status: DC | PRN
  Administered 2015-03-21: 20 mL

## 2015-03-21 MED ORDER — IBUPROFEN 200 MG PO TABS: 200.0000 mg | ORAL_TABLET | Freq: Four times a day (QID) | ORAL | Status: DC | PRN

## 2015-03-21 MED ORDER — MIDAZOLAM HCL 2 MG/2ML IJ SOLN
INTRAMUSCULAR | Status: DC | PRN
  Administered 2015-03-21: 2 mg via INTRAVENOUS

## 2015-03-21 MED ORDER — FENTANYL CITRATE (PF) 100 MCG/2ML IJ SOLN
INTRAMUSCULAR | Status: AC
  Filled 2015-03-21: qty 2

## 2015-03-21 MED ORDER — LIDOCAINE HCL (CARDIAC) 20 MG/ML IV SOLN
INTRAVENOUS | Status: DC | PRN
  Administered 2015-03-21: 100 mg via INTRAVENOUS

## 2015-03-21 MED ORDER — FAMOTIDINE 20 MG PO TABS
ORAL_TABLET | ORAL | Status: AC
  Administered 2015-03-21: 20 mg via ORAL
  Filled 2015-03-21: qty 1

## 2015-03-21 MED ORDER — LABETALOL HCL 5 MG/ML IV SOLN
INTRAVENOUS | Status: AC
  Administered 2015-03-21: 10 mg via INTRAVENOUS
  Filled 2015-03-21: qty 4

## 2015-03-21 MED ORDER — ONDANSETRON HCL 4 MG/2ML IJ SOLN: 4.0000 mg | Freq: Once | INTRAMUSCULAR | Status: DC | PRN

## 2015-03-21 MED ORDER — KETAMINE HCL 50 MG/ML IJ SOLN
INTRAMUSCULAR | Status: DC | PRN
  Administered 2015-03-21: 25 mg via INTRAMUSCULAR

## 2015-03-21 MED ORDER — LIDOCAINE HCL (PF) 1 % IJ SOLN
INTRAMUSCULAR | Status: AC
  Filled 2015-03-21: qty 30

## 2015-03-21 SURGICAL SUPPLY — 21 items
ABLATOR ENDOMETRIAL MYOSURE (ABLATOR) ×2 IMPLANT
CANISTER SUC SOCK COL 7IN (MISCELLANEOUS) ×2 IMPLANT
CATH ROBINSON RED A/P 16FR (CATHETERS) ×2 IMPLANT
GLOVE BIO SURGEON STRL SZ7 (GLOVE) ×2 IMPLANT
GLOVE INDICATOR 7.5 STRL GRN (GLOVE) ×2 IMPLANT
GOWN STRL REUS W/ TWL LRG LVL3 (GOWN DISPOSABLE) ×1 IMPLANT
GOWN STRL REUS W/ TWL XL LVL3 (GOWN DISPOSABLE) ×1 IMPLANT
GOWN STRL REUS W/TWL LRG LVL3 (GOWN DISPOSABLE) ×1
GOWN STRL REUS W/TWL XL LVL3 (GOWN DISPOSABLE) ×1
KIT RM TURNOVER STRD PROC AR (KITS) ×2 IMPLANT
MYOSURE LITE POLYP REMOVAL (MISCELLANEOUS) IMPLANT
NEEDLE SPNL 22GX3.5 QUINCKE BK (NEEDLE) ×2 IMPLANT
PACK DNC HYST (MISCELLANEOUS) ×2 IMPLANT
PAD OB MATERNITY 4.3X12.25 (PERSONAL CARE ITEMS) ×2 IMPLANT
PAD PREP 24X41 OB/GYN DISP (PERSONAL CARE ITEMS) ×2 IMPLANT
SOL .9 NS 3000ML IRR  AL (IV SOLUTION) ×1
SOL .9 NS 3000ML IRR UROMATIC (IV SOLUTION) ×1 IMPLANT
SYRINGE 10CC LL (SYRINGE) ×2 IMPLANT
TOWEL OR 17X26 4PK STRL BLUE (TOWEL DISPOSABLE) ×2 IMPLANT
TUBING CONNECTING 10 (TUBING) ×2 IMPLANT
TUBING HYSTEROSCOPY DOLPHIN (MISCELLANEOUS) ×2 IMPLANT

## 2015-03-21 NOTE — Anesthesia Postprocedure Evaluation (Signed)
  Anesthesia Post-op Note  Patient: [name]  Procedure(s) Performed: Procedure(s): DILATATION & CURETTAGE/HYSTEROSCOPY (N/A)  Anesthesia type:General  Patient location: PACU  Post pain: Pain level controlled  Post assessment: Post-op Vital signs reviewed, Patient's Cardiovascular Status Stable, Respiratory Function Stable, Patent Airway and No signs of Nausea or vomiting  Post vital signs: Reviewed and stable  Last Vitals:  Filed Vitals:   03/21/15 1606  BP: 158/109  Pulse:   Temp: 36.2 C  Resp: 16    Level of consciousness: awake, alert  and patient cooperative  Complications: No apparent anesthesia complications

## 2015-03-21 NOTE — H&P (Signed)
Office H&P reviewed and will proceed with hysteroscopy, D&C for AUB  Durene Romans MD [place]  Pager: [phone]

## 2015-03-21 NOTE — Discharge Instructions (Addendum)
[facility]  We will discuss your surgery once again in detail at your post-op visit in two to four weeks. If you havent already done so, please call to make your appointment as soon as possible.  [place] ([facility]) [address] [place] [place], [place] [address] [place], [place] [address]  Phone: [phone] Phone: [phone]  Fax: [phone] Fax: [phone]       Hysteroscopy, Care After Refer to this sheet in the next few weeks. These instructions provide you with information on caring for yourself after your procedure. Your health care provider may also give you more specific instructions. Your treatment has been planned according to current medical practices, but problems sometimes occur. Call your health care provider if you have any problems or questions after your procedure.  WHAT TO EXPECT AFTER THE PROCEDURE After your procedure, it is typical to have the following:  You may have some cramping. This normally lasts for a couple days.  You may have bleeding. This can vary from light spotting for a few days to menstrual-like bleeding for 3-7 days. HOME CARE INSTRUCTIONS  Rest for the first 1-2 days after the procedure.  Only take over-the-counter or prescription medicines as directed by your health care provider. Do not take aspirin. It can increase the chances of bleeding.  Take showers instead of baths for 2 weeks or as directed by your health care provider.  Do not drive for 24 hours or as directed.  Do not drink alcohol while taking pain medicine.  Do not use tampons, douche, or have sexual intercourse for 2 weeks or until your health care provider says it is okay.  Take your temperature twice a day for 4-5 days. Write it down each time.  Follow your health care provider's advice about diet, exercise, and lifting.  If you develop constipation, you may:  Take a mild laxative if your health care provider approves.  Add bran  foods to your diet.  Drink enough fluids to keep your urine clear or pale yellow.  Try to have someone with you or available to you for the first 24-48 hours, especially if you were given a general anesthetic.  Follow up with your health care provider as directed. SEEK MEDICAL CARE IF:  You feel dizzy or lightheaded.  You feel sick to your stomach (nauseous).  You have abnormal vaginal discharge.  You have a rash.  You have pain that is not controlled with medicine. SEEK IMMEDIATE MEDICAL CARE IF:  You have bleeding that is heavier than a normal menstrual period.  You have a fever.  You have increasing cramps or pain, not controlled with medicine.  You have new belly (abdominal) pain.  You pass out.  You have pain in the tops of your shoulders (shoulder strap areas).  You have shortness of breath. Document Released: 08/09/2013 Document Reviewed: 08/09/2013 [facility] Patient Information 2015 [place], [place]. This information is not intended to replace advice given to you by your health care provider. Make sure you discuss any questions you have with your health care provider. AMBULATORY SURGERY  DISCHARGE INSTRUCTIONS   1) The drugs that you were given will stay in your system until tomorrow so for the next 24 hours you should not:  A) Drive an automobile B) Make any legal decisions C) Drink any alcoholic beverage   2) You may resume regular meals tomorrow.  Today it is better to start with liquids and gradually work up to solid foods.  You  may eat anything you prefer, but it is better to start with liquids, then soup and crackers, and gradually work up to solid foods.   3) Please notify your doctor immediately if you have any unusual bleeding, trouble breathing, redness and pain at the surgery site, drainage, fever, or pain not relieved by medication. 4)   5) Your post-operative visit with: Keep follow up appointment with Dr. George Ina

## 2015-03-21 NOTE — Transfer of Care (Signed)
Immediate Anesthesia Transfer of Care Note  Patient: [name]  Procedure(s) Performed: Procedure(s): DILATATION & CURETTAGE/HYSTEROSCOPY (N/A)  Patient Location: PACU  Anesthesia Type:General  Level of Consciousness: awake, alert  and oriented  Airway & Oxygen Therapy: Patient Spontanous Breathing and Patient connected to nasal cannula oxygen  Post-op Assessment: Report given to RN and Post -op Vital signs reviewed and stable  Post vital signs: Reviewed and stable  Last Vitals:  Filed Vitals:   03/21/15 1606  BP: 158/109  Pulse:   Temp: 36.2 C  Resp: 16    Complications: No apparent anesthesia complications

## 2015-03-21 NOTE — Op Note (Signed)
Operative Note   03/21/2015  PRE-OP DIAGNOSIS: Post menopausal bleeding. Cervical stenosis   POST-OP DIAGNOSIS: Same   SURGEON: Surgeon(s) and Role:    * Aletha Halim, MD - Primary  ASSISTANT: none  PROCEDURE: Procedure(s): DILATATION & CURETTAGE/HYSTEROSCOPY   ANESTHESIA: LMA and paracervical block  ESTIMATED BLOOD LOSS: 68mL  DRAINS: I/O 52mL    TOTAL IV FLUIDS: 869mL crystalloid  SPECIMENS: endometrial curettings  VTE PROPHYLAXIS: SCDs to the bilateral lower extremities  ANTIBIOTICS: not indicated  FLUID DEFICIT: 829FA  COMPLICATIONS: none  DISPOSITION: PACU - hemodynamically stable.  CONDITION: stable  FINDINGS: Exam under anesthesia revealed small, mobile AV uterus with no masses and bilateral adnexa without masses or fullness. Hysteroscopy revealed a grossly normal, atrophic appearing uterine cavity with bilateral tubal ostia and normal appearing endocervical canal. Uterus sounded to 8cm.  PROCEDURE IN DETAIL:  After informed consent was obtained, the patient was taken to the operating room where anesthesia was obtained without difficulty. The patient was positioned in the dorsal lithotomy position in [place].  The patient's bladder was catheterized with an in and out foley catheter.  The patient was examined under anesthesia, with the above noted findings.  The bi-valved speculum was placed inside the patient's vagina, and the the anterior lip of the cervix was seen and grasped with the tenaculum.  A paracervical block was achieved with 15mL of 1% lidocaine.  The uterine cavity was sounded to 8cm, and then the cervix was progressively dilated to a 19French-Pratt dilator.  The hysteroscope was introduced, with the above noted findings. The hystersocope was removed and the uterine cavity was curetted until a gritty texture was noted, yielding scant endometrial curettings.  Excellent hemostasis was noted, and all instruments were removed, with excellent  hemostasis noted throughout.  She was then taken out of dorsal lithotomy.  The patient tolerated the procedure well.  Sponge, lap and needle counts were correct x2.  The patient was taken to recovery room in excellent condition.  Durene Romans MD [facility] Pager [phone]

## 2015-03-21 NOTE — Anesthesia Preprocedure Evaluation (Addendum)
Anesthesia Evaluation  Patient identified by MRN, date of birth, ID band Patient awake    Reviewed: Allergy & Precautions, H&P , NPO status , Patient's Chart, lab work & pertinent test results  Airway Mallampati: II   Neck ROM: Full    Dental  (+) Chipped   Pulmonary COPDCurrent Smoker,    Pulmonary exam normal       Cardiovascular hypertension, Normal cardiovascular exam    Neuro/Psych PSYCHIATRIC DISORDERS Depression    GI/Hepatic negative GI ROS, Neg liver ROS,   Endo/Other  negative endocrine ROS  Renal/GU negative Renal ROS  negative genitourinary   Musculoskeletal negative musculoskeletal ROS (+)   Abdominal Normal abdominal exam  (+)   Peds negative pediatric ROS (+)  Hematology negative hematology ROS (+)   Anesthesia Other Findings Hx of IBS.  Reproductive/Obstetrics negative OB ROS                           Anesthesia Physical Anesthesia Plan  ASA: III  Anesthesia Plan: General   Post-op Pain Management:    Induction: Intravenous  Airway Management Planned: LMA  Additional Equipment:   Intra-op Plan:   Post-operative Plan:   Informed Consent: I have reviewed the patients History and Physical, chart, labs and discussed the procedure including the risks, benefits and alternatives for the proposed anesthesia with the patient or authorized representative who has indicated his/her understanding and acceptance.     Plan Discussed with: CRNA and Surgeon  Anesthesia Plan Comments:        Anesthesia Quick Evaluation

## 2015-03-22 ENCOUNTER — Encounter: Payer: Self-pay | Admitting: Obstetrics and Gynecology

## 2015-03-25 LAB — SURGICAL PATHOLOGY

## 2015-04-04 DIAGNOSIS — G890 Central pain syndrome: Secondary | ICD-10-CM | POA: Diagnosis not present

## 2015-04-04 DIAGNOSIS — M542 Cervicalgia: Secondary | ICD-10-CM | POA: Diagnosis not present

## 2015-04-04 DIAGNOSIS — M25511 Pain in right shoulder: Secondary | ICD-10-CM | POA: Diagnosis not present

## 2015-04-04 DIAGNOSIS — G541 Lumbosacral plexus disorders: Secondary | ICD-10-CM | POA: Diagnosis not present

## 2015-04-04 DIAGNOSIS — M25552 Pain in left hip: Secondary | ICD-10-CM | POA: Diagnosis not present

## 2015-04-04 DIAGNOSIS — M25512 Pain in left shoulder: Secondary | ICD-10-CM | POA: Diagnosis not present

## 2015-04-04 DIAGNOSIS — M25551 Pain in right hip: Secondary | ICD-10-CM | POA: Diagnosis not present

## 2015-04-04 DIAGNOSIS — G8929 Other chronic pain: Secondary | ICD-10-CM | POA: Diagnosis not present

## 2015-04-16 ENCOUNTER — Encounter: Payer: Self-pay | Admitting: Family Medicine

## 2015-04-20 ENCOUNTER — Other Ambulatory Visit: Payer: Self-pay | Admitting: Family Medicine

## 2015-04-20 NOTE — Telephone Encounter (Signed)
She needs follow up

## 2015-04-22 ENCOUNTER — Telehealth: Payer: Self-pay | Admitting: Family Medicine

## 2015-04-22 MED ORDER — ALPRAZOLAM 0.5 MG PO TABS: 0.5000 mg | ORAL_TABLET | Freq: Two times a day (BID) | ORAL | Status: DC | PRN

## 2015-04-22 NOTE — Telephone Encounter (Signed)
Patient requesting refill. 

## 2015-04-22 NOTE — Telephone Encounter (Signed)
Pt is requesting a refill on Alprazolam 0.5mg .  Her last appointment was on 01/15/15 and she is schedule to for an office visit on 05/08/15 @11am .  She uses [facility]

## 2015-04-22 NOTE — Telephone Encounter (Signed)
done

## 2015-05-07 ENCOUNTER — Encounter: Payer: Self-pay | Admitting: Family Medicine

## 2015-05-07 DIAGNOSIS — E785 Hyperlipidemia, unspecified: Secondary | ICD-10-CM | POA: Insufficient documentation

## 2015-05-07 DIAGNOSIS — L309 Dermatitis, unspecified: Secondary | ICD-10-CM | POA: Insufficient documentation

## 2015-05-07 DIAGNOSIS — M25512 Pain in left shoulder: Secondary | ICD-10-CM | POA: Diagnosis not present

## 2015-05-07 DIAGNOSIS — F329 Major depressive disorder, single episode, unspecified: Secondary | ICD-10-CM | POA: Insufficient documentation

## 2015-05-07 DIAGNOSIS — N951 Menopausal and female climacteric states: Secondary | ICD-10-CM | POA: Insufficient documentation

## 2015-05-07 DIAGNOSIS — I10 Essential (primary) hypertension: Secondary | ICD-10-CM | POA: Insufficient documentation

## 2015-05-07 DIAGNOSIS — E8881 Metabolic syndrome: Secondary | ICD-10-CM | POA: Insufficient documentation

## 2015-05-07 DIAGNOSIS — Z79891 Long term (current) use of opiate analgesic: Secondary | ICD-10-CM | POA: Diagnosis not present

## 2015-05-07 DIAGNOSIS — G8929 Other chronic pain: Secondary | ICD-10-CM | POA: Insufficient documentation

## 2015-05-07 DIAGNOSIS — M25511 Pain in right shoulder: Secondary | ICD-10-CM | POA: Diagnosis not present

## 2015-05-07 DIAGNOSIS — R739 Hyperglycemia, unspecified: Secondary | ICD-10-CM | POA: Insufficient documentation

## 2015-05-07 DIAGNOSIS — K582 Mixed irritable bowel syndrome: Secondary | ICD-10-CM | POA: Insufficient documentation

## 2015-05-07 DIAGNOSIS — M545 Low back pain: Secondary | ICD-10-CM | POA: Diagnosis not present

## 2015-05-07 DIAGNOSIS — K589 Irritable bowel syndrome without diarrhea: Secondary | ICD-10-CM | POA: Insufficient documentation

## 2015-05-07 DIAGNOSIS — E559 Vitamin D deficiency, unspecified: Secondary | ICD-10-CM | POA: Insufficient documentation

## 2015-05-07 DIAGNOSIS — F339 Major depressive disorder, recurrent, unspecified: Secondary | ICD-10-CM | POA: Insufficient documentation

## 2015-05-07 DIAGNOSIS — G890 Central pain syndrome: Secondary | ICD-10-CM | POA: Diagnosis not present

## 2015-05-07 DIAGNOSIS — F5104 Psychophysiologic insomnia: Secondary | ICD-10-CM | POA: Insufficient documentation

## 2015-05-07 DIAGNOSIS — K219 Gastro-esophageal reflux disease without esophagitis: Secondary | ICD-10-CM | POA: Insufficient documentation

## 2015-05-08 ENCOUNTER — Encounter: Payer: Self-pay | Admitting: Family Medicine

## 2015-05-08 ENCOUNTER — Ambulatory Visit (INDEPENDENT_AMBULATORY_CARE_PROVIDER_SITE_OTHER): Payer: Medicare Other | Admitting: Family Medicine

## 2015-05-08 DIAGNOSIS — J449 Chronic obstructive pulmonary disease, unspecified: Secondary | ICD-10-CM | POA: Diagnosis not present

## 2015-05-08 DIAGNOSIS — I10 Essential (primary) hypertension: Secondary | ICD-10-CM

## 2015-05-08 DIAGNOSIS — F418 Other specified anxiety disorders: Secondary | ICD-10-CM

## 2015-05-08 DIAGNOSIS — F5104 Psychophysiologic insomnia: Secondary | ICD-10-CM

## 2015-05-08 DIAGNOSIS — K219 Gastro-esophageal reflux disease without esophagitis: Secondary | ICD-10-CM

## 2015-05-08 DIAGNOSIS — G4700 Insomnia, unspecified: Secondary | ICD-10-CM

## 2015-05-08 DIAGNOSIS — K589 Irritable bowel syndrome without diarrhea: Secondary | ICD-10-CM | POA: Diagnosis not present

## 2015-05-08 MED ORDER — ARIPIPRAZOLE 2 MG PO TABS: 2.0000 mg | ORAL_TABLET | Freq: Every day | ORAL | Status: DC

## 2015-05-08 MED ORDER — RANITIDINE HCL 300 MG PO CAPS: 300.0000 mg | ORAL_CAPSULE | Freq: Every day | ORAL | Status: DC | PRN

## 2015-05-08 MED ORDER — LOSARTAN POTASSIUM 100 MG PO TABS: 100.0000 mg | ORAL_TABLET | Freq: Every day | ORAL | Status: DC

## 2015-05-08 MED ORDER — AMITRIPTYLINE HCL 10 MG PO TABS: 10.0000 mg | ORAL_TABLET | Freq: Every day | ORAL | Status: DC

## 2015-05-08 MED ORDER — AMLODIPINE BESYLATE 5 MG PO TABS: 5.0000 mg | ORAL_TABLET | Freq: Every day | ORAL | Status: DC

## 2015-05-08 MED ORDER — DESVENLAFAXINE SUCCINATE ER 50 MG PO TB24: 50.0000 mg | ORAL_TABLET | Freq: Every day | ORAL | Status: DC

## 2015-05-08 MED ORDER — ALPRAZOLAM 0.5 MG PO TABS: 0.5000 mg | ORAL_TABLET | Freq: Two times a day (BID) | ORAL | Status: DC | PRN

## 2015-05-08 NOTE — Progress Notes (Signed)
Name: [name]   MRN: [id number]    DOB: 1961-03-07   Date:05/08/2015       Progress Note  Subjective  Chief Complaint  Chief Complaint  Patient presents with  . Hypertension  . Pain    low back radiating to the hips  . Depression    HPI  HTN: taking medication, good compliance, denies side effects of medication.   Chronic Low Back Pain: sees Dr. Angie Fava at [facility] in [place]. Pain is stable , taking medications as prescribed.  Depression/Anxiety: denies suicidal thoughts or ideation. Taking medications as prescribed, still has episodes of feeling down, but has more motivation , no crying spells.   COPD: off medications, no cough, wheezing or SOB, still smoking.  GERD: she was doing well, but last night she had indigestion, substernal burning and she did not have any medications to take. She sates she had to sit up and symptoms improved.  Chronic Insomnia: taking Alprazolam and it works well for her, able to fall and stay asleep, sleeping 8 hours with medication, no side effects.   Patient Active Problem List   Diagnosis Date Noted  . COPD, mild 05/08/2015  . Vitamin D deficiency 05/07/2015  . Metabolic syndrome 42/68/3419  . Eczema 05/07/2015  . Chronic insomnia 05/07/2015  . Hypertension, benign 05/07/2015  . Dyslipidemia 05/07/2015  . Hyperglycemia 05/07/2015  . Chronic low back pain 05/07/2015  . IBS (irritable bowel syndrome) 05/07/2015  . Gastroesophageal reflux disease without esophagitis 05/07/2015  . Depression with anxiety 05/07/2015  . Menopausal syndrome (hot flashes) 05/07/2015  . History of postmenopausal bleeding 01/14/2015    Past Surgical History  Procedure Laterality Date  . Cholecystectomy    . Endometrial ablation    . Tubal ligation    . Tonsillectomy    . Breast lumpectomy    . Spinal fusion    . Cervical discectomy      x 2  . Dilatation & curettage/hysteroscopy with myosure N/A 03/21/2015    Procedure: DILATATION &  CURETTAGE/HYSTEROSCOPY;  Surgeon: Aletha Halim, MD;  Location: [facility] ORS;  Service: Gynecology;  Laterality: N/A;    History reviewed. No pertinent family history.  History   Social History  . Marital Status: Married    Spouse Name: N/A  . Number of Children: N/A  . Years of Education: N/A   Occupational History  . Not on file.   Social History Main Topics  . Smoking status: Current Every Day Smoker -- 6.00 packs/day for 10 years    Types: Cigarettes  . Smokeless tobacco: Current User  . Alcohol Use: No  . Drug Use: No  . Sexual Activity: Yes   Other Topics Concern  . Not on file   Social History Narrative     Current outpatient prescriptions:  .  ALPRAZolam (XANAX) 0.5 MG tablet, Take 1 tablet (0.5 mg total) by mouth 2 (two) times daily as needed for anxiety., Disp: 45 tablet, Rfl: 2 .  amitriptyline (ELAVIL) 10 MG tablet, Take 1 tablet (10 mg total) by mouth at bedtime., Disp: 30 tablet, Rfl: 5 .  amLODipine (NORVASC) 5 MG tablet, Take 1 tablet (5 mg total) by mouth daily., Disp: 30 tablet, Rfl: 5 .  ARIPiprazole (ABILIFY) 2 MG tablet, Take 1 tablet (2 mg total) by mouth at bedtime., Disp: 30 tablet, Rfl: 5 .  desoximetasone (TOPICORT) 0.25 % cream, Apply 1 application topically 2 (two) times daily as needed. , Disp: , Rfl:  .  desvenlafaxine (PRISTIQ)  50 MG 24 hr tablet, Take 1 tablet (50 mg total) by mouth daily., Disp: 30 tablet, Rfl: 5 .  docusate sodium (COLACE) 100 MG capsule, Take 1 capsule (100 mg total) by mouth 2 (two) times daily., Disp: 30 capsule, Rfl: 0 .  Fluticasone-Salmeterol (ADVAIR DISKUS) 250-50 MCG/DOSE AEPB, Inhale 1 puff into the lungs 2 (two) times daily., Disp: , Rfl:  .  gabapentin (NEURONTIN) 300 MG capsule, Take 1 capsule by mouth every 12 (twelve) hours., Disp: , Rfl:  .  ibuprofen (MOTRIN IB) 200 MG tablet, Take 1 tablet (200 mg total) by mouth every 6 (six) hours as needed., Disp: 30 tablet, Rfl: 0 .  losartan (COZAAR) 100 MG tablet, Take 1  tablet (100 mg total) by mouth daily., Disp: 30 tablet, Rfl: 5 .  oxyCODONE (ROXICODONE) 15 MG immediate release tablet, Take 1 tablet by mouth every 4 (four) hours., Disp: , Rfl:  .  tiZANidine (ZANAFLEX) 4 MG tablet, Take 1 tablet by mouth every 12 (twelve) hours., Disp: , Rfl:   No Known Allergies   ROS  Constitutional: Negative for fever or weight change.  Respiratory: Negative for cough and shortness of breath.   Cardiovascular: Negative for chest pain or palpitations.  Gastrointestinal: Negative for abdominal pain, no bowel changes. Indigestion last night Musculoskeletal: Negative for gait problem or joint swelling.  Skin: Negative for rash.  Neurological: Negative for dizziness or headache.  No other specific complaints in a complete review of systems (except as listed in HPI above).  Objective  Filed Vitals:   05/08/15 1121  BP: 118/78  Pulse: 77  Temp: 97.7 F (36.5 C)  TempSrc: Oral  Resp: 18  Height: _0  (1.727 m)  Weight: 201 lb 6.4 oz (91.354 kg)  SpO2: 96%    Body mass index is 30.63 kg/(m^2).  Physical Exam Constitutional: Patient appears well-developed and well-nourished. No distress. Obese Eyes: PERL.  Neck: Normal range of motion. Neck supple. Cardiovascular: Normal rate, regular rhythm and normal heart sounds.  No murmur heard. No BLE edema. Pulmonary/Chest: Effort normal and breath sounds normal. No respiratory distress. Abdominal: Soft.  There is no tenderness. Psychiatric: Patient has a normal mood and affect. behavior is normal. Judgment and thought content normal. Muscular skeletal: pain during palpation of lumbar spine, neg straight leg raise  Recent Results (from the past 2160 hour(s))  Creatinine, serum     Status: None   Collection Time: 02/16/15 10:01 AM  Result Value Ref Range   Creatinine 0.70 mg/dL    Comment: 0.44-1.00 NOTE: New Reference Range  01/08/15    EGFR (African American) >60    EGFR (Non-African Amer.) >60      Comment: eGFR values <45m/min/1.73 m2 may be an indication of chronic kidney disease (CKD). Calculated eGFR is useful in patients with stable renal function. The eGFR calculation will not be reliable in acutely ill patients when serum creatinine is changing rapidly. It is not useful in patients on dialysis. The eGFR calculation may not be applicable to patients at the low and high extremes of body sizes, pregnant women, and vegetarians.   CBC     Status: None   Collection Time: 03/14/15 11:32 AM  Result Value Ref Range   WBC 8.3 3.6 - 11.0 K/uL   RBC 4.06 3.80 - 5.20 MIL/uL   Hemoglobin 12.6 12.0 - 16.0 g/dL   HCT 38.2 35.0 - 47.0 %   MCV 94.0 80.0 - 100.0 fL   MCH 31.1 26.0 - 34.0 pg  MCHC 33.1 32.0 - 36.0 g/dL   RDW 13.5 11.5 - 14.5 %   Platelets 217 150 - 440 K/uL  Pregnancy, urine POC     Status: None   Collection Time: 03/21/15 12:42 PM  Result Value Ref Range   Preg Test, Ur NEGATIVE NEGATIVE    Comment:        THE SENSITIVITY OF THIS METHODOLOGY IS >24 mIU/mL   Surgical pathology     Status: None   Collection Time: 03/21/15  3:21 PM  Result Value Ref Range   SURGICAL PATHOLOGY      Surgical Pathology CASE: [id number] PATIENT: Oriya Giusto Surgical Pathology Report     SPECIMEN SUBMITTED: A. Endometrial Currettings  CLINICAL HISTORY: None provided  PRE-OPERATIVE DIAGNOSIS: Post menopausal bleeding, cervical stenosis  POST-OPERATIVE DIAGNOSIS: Post Menopausal bleeding     DIAGNOSIS: A. ENDOMETRIUM; CURETTAGE: - MINUTE FRAGMENTS OF BENIGN ENDOMETRIAL EPITHELIUM AND STROMA. - BENIGN CERVICAL TISSUE.   GROSS DESCRIPTION: A. Labeled: Endometrial curettings Tissue Fragment(s): Multiple Measurement: Aggregate 1 x 0.2 x 0.1 cm Comment: Hemorrhagic tissue fragments and blood  Entirely submitted in cassette(s): 1    Final Diagnosis performed by Delorse Lek, MD.  Electronically signed 03/25/2015 12:08:30PM    The electronic signature  indicates that the named Attending Pathologist has evaluated the specimen  Technical component performed at [facility], [address] [place], [place] [address] Lab: [phone] Dir: Darrick Penna. Leona Singleton, MD  Professional component performed at [facility], [facility], [place], [place], [place] [address] Lab: [phone] Dir: Dellia Nims. Rubinas, MD       PHQ2/9: Depression screen PHQ 2/9 05/08/2015  Decreased Interest 1  Down, Depressed, Hopeless 1  PHQ - 2 Score 2  Altered sleeping 1  Tired, decreased energy 1  Change in appetite 1  Feeling bad or failure about yourself  0  Trouble concentrating 0  Moving slowly or fidgety/restless 0  Suicidal thoughts 0  PHQ-9 Score 5  Difficult doing work/chores Somewhat difficult   She does not adjust medications at this time  Fall Risk: Fall Risk  05/08/2015  Falls in the past year? No     Assessment & Plan  1. Hypertension, benign At goal  - losartan (COZAAR) 100 MG tablet; Take 1 tablet (100 mg total) by mouth daily.  Dispense: 30 tablet; Refill: 5 - amLODipine (NORVASC) 5 MG tablet; Take 1 tablet (5 mg total) by mouth daily.  Dispense: 30 tablet; Refill: 5  2. Depression with anxiety Doing well, continue medication, not in remission, worried about daughter going to [place] , but much better than she was and does not want to change medication - desvenlafaxine (PRISTIQ) 50 MG 24 hr tablet; Take 1 tablet (50 mg total) by mouth daily.  Dispense: 30 tablet; Refill: 5 - ARIPiprazole (ABILIFY) 2 MG tablet; Take 1 tablet (2 mg total) by mouth at bedtime.  Dispense: 30 tablet; Refill: 5 - ALPRAZolam (XANAX) 0.5 MG tablet; Take 1 tablet (0.5 mg total) by mouth 2 (two) times daily as needed for anxiety.  Dispense: 45 tablet; Refill: 2  3. Gastroesophageal reflux disease without esophagitis She is doing well, occasionally has symptoms, advised ranitidine prn only  -Ranitidine 300 m po daily prn GERD 4. Chronic  insomnia Doing well, takes one and half pill qpm for sleep and is doing well  - ALPRAZolam (XANAX) 0.5 MG tablet; Take 1 tablet (0.5 mg total) by mouth 2 (two) times daily as needed for anxiety.  Dispense: 45 tablet; Refill:  2  5. COPD, mild Off medication, discussed importance of quitting smoking  6. IBS (irritable bowel syndrome) Stable on medication - amitriptyline (ELAVIL) 10 MG tablet; Take 1 tablet (10 mg total) by mouth at bedtime.  Dispense: 30 tablet; Refill: 5

## 2015-06-04 DIAGNOSIS — M545 Low back pain: Secondary | ICD-10-CM | POA: Diagnosis not present

## 2015-06-04 DIAGNOSIS — G8929 Other chronic pain: Secondary | ICD-10-CM | POA: Diagnosis not present

## 2015-06-04 DIAGNOSIS — M542 Cervicalgia: Secondary | ICD-10-CM | POA: Diagnosis not present

## 2015-06-04 DIAGNOSIS — M25552 Pain in left hip: Secondary | ICD-10-CM | POA: Diagnosis not present

## 2015-06-04 DIAGNOSIS — Z79891 Long term (current) use of opiate analgesic: Secondary | ICD-10-CM | POA: Diagnosis not present

## 2015-06-04 DIAGNOSIS — M25551 Pain in right hip: Secondary | ICD-10-CM | POA: Diagnosis not present

## 2015-06-04 DIAGNOSIS — M25512 Pain in left shoulder: Secondary | ICD-10-CM | POA: Diagnosis not present

## 2015-06-04 DIAGNOSIS — M25511 Pain in right shoulder: Secondary | ICD-10-CM | POA: Diagnosis not present

## 2015-06-05 DIAGNOSIS — G8929 Other chronic pain: Secondary | ICD-10-CM | POA: Diagnosis not present

## 2015-06-05 DIAGNOSIS — M545 Low back pain: Secondary | ICD-10-CM | POA: Diagnosis not present

## 2015-07-05 DIAGNOSIS — G8929 Other chronic pain: Secondary | ICD-10-CM | POA: Diagnosis not present

## 2015-07-05 DIAGNOSIS — M5417 Radiculopathy, lumbosacral region: Secondary | ICD-10-CM | POA: Diagnosis not present

## 2015-07-05 DIAGNOSIS — M25551 Pain in right hip: Secondary | ICD-10-CM | POA: Diagnosis not present

## 2015-07-05 DIAGNOSIS — M25552 Pain in left hip: Secondary | ICD-10-CM | POA: Diagnosis not present

## 2015-07-05 DIAGNOSIS — Z79891 Long term (current) use of opiate analgesic: Secondary | ICD-10-CM | POA: Diagnosis not present

## 2015-07-05 DIAGNOSIS — M542 Cervicalgia: Secondary | ICD-10-CM | POA: Diagnosis not present

## 2015-07-18 ENCOUNTER — Other Ambulatory Visit: Payer: Self-pay | Admitting: Family Medicine

## 2015-07-18 NOTE — Telephone Encounter (Signed)
Patient requesting refill. 

## 2015-07-19 NOTE — Telephone Encounter (Signed)
Spoke with Pharmacist at [facility] and they stated this is a automatic system that goes ahead and send out a refill request when the last refill was filled. Patient picked up her last refill yesterday 07/18/2015 and will be out of her Alprazolam then, and they state it does this so the patient will not have any lapse in their medication therapy.

## 2015-08-05 DIAGNOSIS — G890 Central pain syndrome: Secondary | ICD-10-CM | POA: Diagnosis not present

## 2015-08-05 DIAGNOSIS — Z79891 Long term (current) use of opiate analgesic: Secondary | ICD-10-CM | POA: Diagnosis not present

## 2015-08-05 DIAGNOSIS — M25552 Pain in left hip: Secondary | ICD-10-CM | POA: Diagnosis not present

## 2015-08-05 DIAGNOSIS — M545 Low back pain: Secondary | ICD-10-CM | POA: Diagnosis not present

## 2015-08-05 DIAGNOSIS — M542 Cervicalgia: Secondary | ICD-10-CM | POA: Diagnosis not present

## 2015-08-05 DIAGNOSIS — M25551 Pain in right hip: Secondary | ICD-10-CM | POA: Diagnosis not present

## 2015-08-08 ENCOUNTER — Encounter: Payer: Self-pay | Admitting: Family Medicine

## 2015-08-08 ENCOUNTER — Ambulatory Visit (INDEPENDENT_AMBULATORY_CARE_PROVIDER_SITE_OTHER): Payer: Medicare Other | Admitting: Family Medicine

## 2015-08-08 VITALS — BP 118/82 | HR 77 | Temp 98.1°F | Resp 16 | Ht 68.0 in | Wt 199.1 lb

## 2015-08-08 DIAGNOSIS — E8881 Metabolic syndrome: Secondary | ICD-10-CM | POA: Diagnosis not present

## 2015-08-08 DIAGNOSIS — L309 Dermatitis, unspecified: Secondary | ICD-10-CM

## 2015-08-08 DIAGNOSIS — J449 Chronic obstructive pulmonary disease, unspecified: Secondary | ICD-10-CM

## 2015-08-08 DIAGNOSIS — F329 Major depressive disorder, single episode, unspecified: Secondary | ICD-10-CM | POA: Diagnosis not present

## 2015-08-08 DIAGNOSIS — Z23 Encounter for immunization: Secondary | ICD-10-CM

## 2015-08-08 DIAGNOSIS — F418 Other specified anxiety disorders: Secondary | ICD-10-CM | POA: Diagnosis not present

## 2015-08-08 DIAGNOSIS — E559 Vitamin D deficiency, unspecified: Secondary | ICD-10-CM | POA: Diagnosis not present

## 2015-08-08 DIAGNOSIS — F5104 Psychophysiologic insomnia: Secondary | ICD-10-CM

## 2015-08-08 DIAGNOSIS — G4700 Insomnia, unspecified: Secondary | ICD-10-CM | POA: Diagnosis not present

## 2015-08-08 MED ORDER — ALPRAZOLAM 0.5 MG PO TABS: 0.5000 mg | ORAL_TABLET | Freq: Every evening | ORAL | Status: DC | PRN

## 2015-08-08 MED ORDER — DESOXIMETASONE 0.25 % EX CREA: 1.0000 "application " | TOPICAL_CREAM | Freq: Two times a day (BID) | CUTANEOUS | Status: DC | PRN

## 2015-08-08 NOTE — Progress Notes (Signed)
Name: [name]   MRN: [id number]    DOB: [birth year]   Date:08/08/2015       Progress Note  Subjective  Chief Complaint  Chief Complaint  Patient presents with  . Medication Management    3 month F/U  . Hypertension  . Gastrophageal Reflux    Well Controlled  . Insomnia    Well controlled, sleeps 7 hours nightly  . Depression    Well controlled with medication    HPI  HTN: taking Norvasc and Losartan, no side effects, no chest pain or palpitation, feeling well, compliant with medication  GERD: well controlled, only taking Ranitidine prn, about once a week, usually after she eats something greasy.   Insomnia: taking Alprazolam, qhs , usually one a half pills per night and is helping her fall and stay asleep   Depression Major: youngest daughter is now at [place] and she still misses her a lot. She is taking medication, no in remission, but feels like medication is helping control her irritability and sadness. No crying spells recently. She does not want to change medication regiment at this time  COPD: she is still smoking, no cough, no wheezing or SOB, she was using Advair but stopped because symptoms resolved.   Eczema: currently no rashes, symptoms usually present in the Spring and Summer.   Patient Active Problem List   Diagnosis Date Noted  . COPD, mild ([place]) 05/08/2015  . Vitamin D deficiency 05/07/2015  . Metabolic syndrome 02/77/4128  . Eczema 05/07/2015  . Chronic insomnia 05/07/2015  . Hypertension, benign 05/07/2015  . Dyslipidemia 05/07/2015  . Hyperglycemia 05/07/2015  . Chronic low back pain 05/07/2015  . IBS (irritable bowel syndrome) 05/07/2015  . Gastroesophageal reflux disease without esophagitis 05/07/2015  . Major depression, chronic ([place]) 05/07/2015  . Menopausal syndrome (hot flashes) 05/07/2015  . History of postmenopausal bleeding 01/14/2015    Past Surgical History  Procedure Laterality Date  . Cholecystectomy    .  Endometrial ablation    . Tubal ligation    . Tonsillectomy    . Breast lumpectomy    . Spinal fusion    . Cervical discectomy      x 2  . Dilatation & curettage/hysteroscopy with myosure N/A 03/21/2015    Procedure: DILATATION & CURETTAGE/HYSTEROSCOPY;  Surgeon: Aletha Halim, MD;  Location: [facility] ORS;  Service: Gynecology;  Laterality: N/A;    History reviewed. No pertinent family history.  Social History   Social History  . Marital Status: Married    Spouse Name: N/A  . Number of Children: N/A  . Years of Education: N/A   Occupational History  . Not on file.   Social History Main Topics  . Smoking status: Current Every Day Smoker -- 6.00 packs/day for 10 years    Types: Cigarettes  . Smokeless tobacco: Current User  . Alcohol Use: No  . Drug Use: No  . Sexual Activity: Yes   Other Topics Concern  . Not on file   Social History Narrative     Current outpatient prescriptions:  .  ALPRAZolam (XANAX) 0.5 MG tablet, Take 1 tablet (0.5 mg total) by mouth at bedtime as needed for anxiety. May take up to two daily, Disp: 45 tablet, Rfl: 2 .  amitriptyline (ELAVIL) 10 MG tablet, Take 1 tablet (10 mg total) by mouth at bedtime., Disp: 30 tablet, Rfl: 5 .  amLODipine (NORVASC) 5 MG tablet, Take 1 tablet (5 mg total) by mouth daily., Disp: 30 tablet, Rfl:  5 .  ARIPiprazole (ABILIFY) 2 MG tablet, Take 1 tablet (2 mg total) by mouth at bedtime., Disp: 30 tablet, Rfl: 5 .  desoximetasone (TOPICORT) 0.25 % cream, Apply 1 application topically 2 (two) times daily as needed., Disp: 60 g, Rfl: 1 .  desvenlafaxine (PRISTIQ) 50 MG 24 hr tablet, Take 1 tablet (50 mg total) by mouth daily., Disp: 30 tablet, Rfl: 5 .  EVZIO 0.4 MG/0.4ML SOAJ, , Disp: , Rfl:  .  gabapentin (NEURONTIN) 300 MG capsule, Take 1 capsule by mouth every 12 (twelve) hours., Disp: , Rfl:  .  ibuprofen (MOTRIN IB) 200 MG tablet, Take 1 tablet (200 mg total) by mouth every 6 (six) hours as needed., Disp: 30 tablet,  Rfl: 0 .  losartan (COZAAR) 100 MG tablet, Take 1 tablet (100 mg total) by mouth daily., Disp: 30 tablet, Rfl: 5 .  oxyCODONE (ROXICODONE) 15 MG immediate release tablet, Take 1 tablet by mouth every 4 (four) hours., Disp: , Rfl:  .  ranitidine (ZANTAC) 300 MG capsule, Take 1 capsule (300 mg total) by mouth daily as needed for heartburn., Disp: 30 capsule, Rfl: 1 .  tiZANidine (ZANAFLEX) 4 MG tablet, Take 1 tablet by mouth every 12 (twelve) hours., Disp: , Rfl:   No Known Allergies   ROS  Constitutional: Negative for fever or weight change.  Respiratory: Negative for cough and shortness of breath.   Cardiovascular: Negative for chest pain or palpitations.  Gastrointestinal: Negative for abdominal pain, no bowel changes. Stable IBS Musculoskeletal: Negative for gait problem or joint swelling.  Skin: Negative for rash.  Neurological: Negative for dizziness or headache.  No other specific complaints in a complete review of systems (except as listed in HPI above).  Objective  Filed Vitals:   08/08/15 1158  BP: 118/82  Pulse: 77  Temp: 98.1 F (36.7 C)  TempSrc: Oral  Resp: 16  Height: 5\' 8"  (1.727 m)  Weight: 199 lb 1.6 oz (90.311 kg)  SpO2: 95%    Body mass index is 30.28 kg/(m^2).  Physical Exam  Constitutional: Patient appears well-developed and well-nourished. Obese  No distress.  HEENT: head atraumatic, normocephalic, pupils equal and reactive to light, neck supple, throat within normal limits Cardiovascular: Normal rate, regular rhythm and normal heart sounds.  No murmur heard. No BLE edema. Pulmonary/Chest: Effort normal and breath sounds normal. No respiratory distress. Abdominal: Soft.  There is no tenderness. Psychiatric: Patient has a normal mood and affect. behavior is normal. Judgment and thought content normal. Skin: negative for eczematous patches  PHQ2/9: Depression screen [facility] 2/9 08/08/2015 05/08/2015  Decreased Interest 0 1  Down, Depressed, Hopeless 0 1   PHQ - 2 Score 0 2  Altered sleeping - 1  Tired, decreased energy - 1  Change in appetite - 1  Feeling bad or failure about yourself  - 0  Trouble concentrating - 0  Moving slowly or fidgety/restless - 0  Suicidal thoughts - 0  PHQ-9 Score - 5  Difficult doing work/chores - Somewhat difficult    Fall Risk: Fall Risk  08/08/2015 05/08/2015  Falls in the past year? No No      Functional Status Survey: Is the patient deaf or have difficulty hearing?: No Does the patient have difficulty seeing, even when wearing glasses/contacts?: Yes (glasses) Does the patient have difficulty concentrating, remembering, or making decisions?: No Does the patient have difficulty walking or climbing stairs?: No Does the patient have difficulty dressing or bathing?: No Does the patient have difficulty doing  errands alone such as visiting a doctor's office or shopping?: No    Assessment & Plan  1. Major depression, chronic ([facility])  Offered to change medication but she wants to continue current regiment for now, also discussed counselor but she refuses  2. Needs flu shot  - Flu Vaccine QUAD 36+ mos PF IM (Fluarix & Fluzone Quad PF)  3. Metabolic syndrome  Recheck level yearly, last one was was 6.0%  4. Vitamin D deficiency  Continue otc Vitamin D   5. COPD, mild ([place])  No recent symptoms  6. Chronic insomnia  - ALPRAZolam (XANAX) 0.5 MG tablet; Take 1 tablet (0.5 mg total) by mouth at bedtime as needed for anxiety. May take up to two daily  Dispense: 45 tablet; Refill: 2  7. Eczema  - desoximetasone (TOPICORT) 0.25 % cream; Apply 1 application topically 2 (two) times daily as needed.  Dispense: 60 g; Refill: 1

## 2015-08-19 ENCOUNTER — Other Ambulatory Visit: Payer: Self-pay | Admitting: Family Medicine

## 2015-09-05 ENCOUNTER — Other Ambulatory Visit: Payer: Self-pay | Admitting: Family Medicine

## 2015-09-05 DIAGNOSIS — Z79891 Long term (current) use of opiate analgesic: Secondary | ICD-10-CM | POA: Diagnosis not present

## 2015-09-05 DIAGNOSIS — M25512 Pain in left shoulder: Secondary | ICD-10-CM | POA: Diagnosis not present

## 2015-09-05 DIAGNOSIS — M25551 Pain in right hip: Secondary | ICD-10-CM | POA: Diagnosis not present

## 2015-09-05 DIAGNOSIS — M25511 Pain in right shoulder: Secondary | ICD-10-CM | POA: Diagnosis not present

## 2015-09-05 DIAGNOSIS — M542 Cervicalgia: Secondary | ICD-10-CM | POA: Diagnosis not present

## 2015-09-05 DIAGNOSIS — G8929 Other chronic pain: Secondary | ICD-10-CM | POA: Diagnosis not present

## 2015-09-05 DIAGNOSIS — M545 Low back pain: Secondary | ICD-10-CM | POA: Diagnosis not present

## 2015-09-05 DIAGNOSIS — M25552 Pain in left hip: Secondary | ICD-10-CM | POA: Diagnosis not present

## 2015-10-03 DIAGNOSIS — M79605 Pain in left leg: Secondary | ICD-10-CM | POA: Diagnosis not present

## 2015-10-03 DIAGNOSIS — G541 Lumbosacral plexus disorders: Secondary | ICD-10-CM | POA: Diagnosis not present

## 2015-10-03 DIAGNOSIS — M79604 Pain in right leg: Secondary | ICD-10-CM | POA: Diagnosis not present

## 2015-10-03 DIAGNOSIS — G890 Central pain syndrome: Secondary | ICD-10-CM | POA: Diagnosis not present

## 2015-10-03 DIAGNOSIS — M25571 Pain in right ankle and joints of right foot: Secondary | ICD-10-CM | POA: Diagnosis not present

## 2015-10-03 DIAGNOSIS — M25552 Pain in left hip: Secondary | ICD-10-CM | POA: Diagnosis not present

## 2015-10-03 DIAGNOSIS — G603 Idiopathic progressive neuropathy: Secondary | ICD-10-CM | POA: Diagnosis not present

## 2015-10-03 DIAGNOSIS — M25551 Pain in right hip: Secondary | ICD-10-CM | POA: Diagnosis not present

## 2015-10-03 DIAGNOSIS — Z79891 Long term (current) use of opiate analgesic: Secondary | ICD-10-CM | POA: Diagnosis not present

## 2015-10-03 DIAGNOSIS — M25572 Pain in left ankle and joints of left foot: Secondary | ICD-10-CM | POA: Diagnosis not present

## 2015-11-05 DIAGNOSIS — M25551 Pain in right hip: Secondary | ICD-10-CM | POA: Diagnosis not present

## 2015-11-05 DIAGNOSIS — G8929 Other chronic pain: Secondary | ICD-10-CM | POA: Diagnosis not present

## 2015-11-05 DIAGNOSIS — M545 Low back pain: Secondary | ICD-10-CM | POA: Diagnosis not present

## 2015-11-05 DIAGNOSIS — M25552 Pain in left hip: Secondary | ICD-10-CM | POA: Diagnosis not present

## 2015-11-05 DIAGNOSIS — M542 Cervicalgia: Secondary | ICD-10-CM | POA: Diagnosis not present

## 2015-11-05 DIAGNOSIS — G890 Central pain syndrome: Secondary | ICD-10-CM | POA: Diagnosis not present

## 2015-11-05 DIAGNOSIS — Z79891 Long term (current) use of opiate analgesic: Secondary | ICD-10-CM | POA: Diagnosis not present

## 2015-11-05 DIAGNOSIS — M25511 Pain in right shoulder: Secondary | ICD-10-CM | POA: Diagnosis not present

## 2015-11-05 DIAGNOSIS — G894 Chronic pain syndrome: Secondary | ICD-10-CM | POA: Diagnosis not present

## 2015-11-11 ENCOUNTER — Ambulatory Visit: Payer: Medicare Other | Admitting: Family Medicine

## 2015-11-13 ENCOUNTER — Ambulatory Visit: Payer: Medicare Other | Admitting: Family Medicine

## 2015-11-15 ENCOUNTER — Encounter: Payer: Self-pay | Admitting: Family Medicine

## 2015-11-15 ENCOUNTER — Ambulatory Visit (INDEPENDENT_AMBULATORY_CARE_PROVIDER_SITE_OTHER): Payer: Medicare Other | Admitting: Family Medicine

## 2015-11-15 VITALS — BP 128/76 | HR 100 | Temp 98.4°F | Resp 16 | Wt 205.5 lb

## 2015-11-15 DIAGNOSIS — F5104 Psychophysiologic insomnia: Secondary | ICD-10-CM

## 2015-11-15 DIAGNOSIS — I10 Essential (primary) hypertension: Secondary | ICD-10-CM | POA: Diagnosis not present

## 2015-11-15 DIAGNOSIS — Z720 Tobacco use: Secondary | ICD-10-CM

## 2015-11-15 DIAGNOSIS — M545 Low back pain: Secondary | ICD-10-CM

## 2015-11-15 DIAGNOSIS — Z79899 Other long term (current) drug therapy: Secondary | ICD-10-CM

## 2015-11-15 DIAGNOSIS — G4700 Insomnia, unspecified: Secondary | ICD-10-CM

## 2015-11-15 DIAGNOSIS — M25522 Pain in left elbow: Secondary | ICD-10-CM | POA: Diagnosis not present

## 2015-11-15 DIAGNOSIS — K589 Irritable bowel syndrome without diarrhea: Secondary | ICD-10-CM

## 2015-11-15 DIAGNOSIS — E559 Vitamin D deficiency, unspecified: Secondary | ICD-10-CM | POA: Diagnosis not present

## 2015-11-15 DIAGNOSIS — F329 Major depressive disorder, single episode, unspecified: Secondary | ICD-10-CM

## 2015-11-15 DIAGNOSIS — E8881 Metabolic syndrome: Secondary | ICD-10-CM

## 2015-11-15 DIAGNOSIS — J449 Chronic obstructive pulmonary disease, unspecified: Secondary | ICD-10-CM

## 2015-11-15 DIAGNOSIS — K219 Gastro-esophageal reflux disease without esophagitis: Secondary | ICD-10-CM | POA: Diagnosis not present

## 2015-11-15 DIAGNOSIS — R739 Hyperglycemia, unspecified: Secondary | ICD-10-CM | POA: Diagnosis not present

## 2015-11-15 DIAGNOSIS — G8929 Other chronic pain: Secondary | ICD-10-CM

## 2015-11-15 MED ORDER — DESVENLAFAXINE SUCCINATE ER 50 MG PO TB24: 50.0000 mg | ORAL_TABLET | Freq: Every day | ORAL | Status: DC

## 2015-11-15 MED ORDER — AMITRIPTYLINE HCL 10 MG PO TABS: 10.0000 mg | ORAL_TABLET | Freq: Every day | ORAL | Status: DC

## 2015-11-15 MED ORDER — ARIPIPRAZOLE 2 MG PO TABS: 2.0000 mg | ORAL_TABLET | Freq: Every day | ORAL | Status: DC

## 2015-11-15 MED ORDER — AMLODIPINE BESYLATE 5 MG PO TABS: 5.0000 mg | ORAL_TABLET | Freq: Every day | ORAL | Status: DC

## 2015-11-15 MED ORDER — ALPRAZOLAM 0.5 MG PO TABS: ORAL_TABLET | ORAL | Status: DC

## 2015-11-15 MED ORDER — NAPROXEN 500 MG PO TABS: 500.0000 mg | ORAL_TABLET | Freq: Two times a day (BID) | ORAL | Status: DC

## 2015-11-15 MED ORDER — LOSARTAN POTASSIUM 100 MG PO TABS: 100.0000 mg | ORAL_TABLET | Freq: Every day | ORAL | Status: DC

## 2015-11-15 NOTE — Progress Notes (Signed)
Name: [name]   MRN: [id number]    DOB: 1961/09/10   Date:11/15/2015       Progress Note  Subjective  Chief Complaint  Chief Complaint  Patient presents with  . Depression    patient is here for her 31-month f/u  . Medication Refill    alprazolam 0.5mg     HPI  HTN: taking Norvasc and Losartan, no side effects, no chest pain or palpitation,  compliant with medication. BP at home is around 130's/80's  GERD: well controlled, only taking Ranitidine prn, symptoms are now very sporadic and described as burning and substernal chest pain,  usually after she eats something greasy.   Insomnia: taking Alprazolam, qhs , usually one a half pills per night and is helping her fall and stay asleep. No side effects of medication  Depression Major: youngest daughter is now at [place] and she still misses her a lot, but she is doing well now - adjusted to transition. She is taking medication, she feels like she is in remission. No crying spells recently. She is on medication for maintenance   COPD: she is still smoking and she is not ready to quit at this time, no cough, no wheezing or SOB, she was using Advair but stopped months ago  because symptoms resolved.   Eczema: currently no rashes, symptoms usually present in the Spring and Summer. She uses medication prn, no itching.   Chronic back pain: she goes to [facility] in [place] and the pain has been stable at around 5/10, no side effects of medication  Left Elbow pain: pain started around the holidays, she was cooking a lot at the time, pain is on the medial aspect, aching sensation, no redness or swelling.   Patient Active Problem List   Diagnosis Date Noted  . COPD, mild ([place]) 05/08/2015  . Vitamin D deficiency 05/07/2015  . Metabolic syndrome [id number]  . Eczema 05/07/2015  . Chronic insomnia 05/07/2015  . Hypertension, benign 05/07/2015  . Dyslipidemia 05/07/2015  . Hyperglycemia 05/07/2015  . Chronic low  back pain 05/07/2015  . IBS (irritable bowel syndrome) 05/07/2015  . Gastroesophageal reflux disease without esophagitis 05/07/2015  . Major depression, chronic ([place]) 05/07/2015  . Menopausal syndrome (hot flashes) 05/07/2015  . History of postmenopausal bleeding 01/14/2015    Past Surgical History  Procedure Laterality Date  . Cholecystectomy    . Endometrial ablation    . Tubal ligation    . Tonsillectomy    . Breast lumpectomy    . Spinal fusion    . Cervical discectomy      x 2  . Dilatation & curettage/hysteroscopy with myosure N/A 03/21/2015    Procedure: DILATATION & CURETTAGE/HYSTEROSCOPY;  Surgeon: Aletha Halim, MD;  Location: [facility] ORS;  Service: Gynecology;  Laterality: N/A;    History reviewed. No pertinent family history.  Social History   Social History  . Marital Status: Married    Spouse Name: N/A  . Number of Children: N/A  . Years of Education: N/A   Occupational History  . Not on file.   Social History Main Topics  . Smoking status: Current Every Day Smoker -- 6.00 packs/day for 10 years    Types: Cigarettes  . Smokeless tobacco: Current User  . Alcohol Use: No  . Drug Use: No  . Sexual Activity: Yes   Other Topics Concern  . Not on file   Social History Narrative     Current outpatient prescriptions:  .  ALPRAZolam (  XANAX) 0.5 MG tablet, TAKE ONE TABLET TWICE DAILY AS NEEDED FOR ANXIETY AND OR SLEEP, Disp: 45 tablet, Rfl: 2 .  amitriptyline (ELAVIL) 10 MG tablet, Take 1 tablet (10 mg total) by mouth at bedtime., Disp: 30 tablet, Rfl: 5 .  amLODipine (NORVASC) 5 MG tablet, Take 1 tablet (5 mg total) by mouth daily., Disp: 30 tablet, Rfl: 5 .  ARIPiprazole (ABILIFY) 2 MG tablet, Take 1 tablet (2 mg total) by mouth at bedtime., Disp: 30 tablet, Rfl: 5 .  desoximetasone (TOPICORT) 0.25 % cream, Apply 1 application topically 2 (two) times daily as needed., Disp: 60 g, Rfl: 1 .  desvenlafaxine (PRISTIQ) 50 MG 24 hr tablet, Take 1 tablet (50 mg  total) by mouth daily., Disp: 30 tablet, Rfl: 5 .  EVZIO 0.4 MG/0.4ML SOAJ, , Disp: , Rfl:  .  gabapentin (NEURONTIN) 300 MG capsule, Take 1 capsule by mouth every 12 (twelve) hours., Disp: , Rfl:  .  ibuprofen (MOTRIN IB) 200 MG tablet, Take 1 tablet (200 mg total) by mouth every 6 (six) hours as needed., Disp: 30 tablet, Rfl: 0 .  losartan (COZAAR) 100 MG tablet, Take 1 tablet (100 mg total) by mouth daily., Disp: 30 tablet, Rfl: 5 .  oxyCODONE (ROXICODONE) 15 MG immediate release tablet, Take 1 tablet by mouth every 4 (four) hours., Disp: , Rfl:  .  ranitidine (ZANTAC) 300 MG tablet, Take 1 tablet (300 mg total) by mouth daily as needed for heartburn., Disp: 30 tablet, Rfl: 5 .  tiZANidine (ZANAFLEX) 4 MG tablet, Take 1 tablet by mouth every 12 (twelve) hours., Disp: , Rfl:   No Known Allergies   ROS  Constitutional: Negative for fever , positive for  weight change.  Respiratory: Negative for cough and shortness of breath.   Cardiovascular: Negative for chest pain or palpitations.  Gastrointestinal: Negative for abdominal pain, no bowel changes.  Musculoskeletal: Negative for gait problem or joint swelling.  Skin: Negative for rash.  Neurological: Negative for dizziness or headache.  No other specific complaints in a complete review of systems (except as listed in HPI above).  Objective  Filed Vitals:   11/15/15 0911  BP: 128/76  Pulse: 100  Temp: 98.4 F (36.9 C)  TempSrc: Oral  Resp: 16  Weight: 205 lb 8 oz (93.214 kg)  SpO2: 98%    Body mass index is 31.25 kg/(m^2).  Physical Exam  Constitutional: Patient appears well-developed and well-nourished. Obese  No distress.  HEENT: head atraumatic, normocephalic, pupils equal and reactive to light,  neck supple, throat within normal limits Cardiovascular: Normal rate, regular rhythm and normal heart sounds.  No murmur heard. No BLE edema. Pulmonary/Chest: Effort normal and breath sounds normal. No respiratory  distress. Abdominal: Soft.  There is no tenderness. Psychiatric: Patient has a normal mood and affect. behavior is normal. Judgment and thought content normal. Muscular Skeletal: pain during palpation of medial left elbow, no redness or swelling, pain is localized, no bruising   PHQ2/9: Depression screen [facility] 2/9 11/15/2015 08/08/2015 05/08/2015  Decreased Interest 0 0 1  Down, Depressed, Hopeless 0 0 1  PHQ - 2 Score 0 0 2  Altered sleeping - - 1  Tired, decreased energy - - 1  Change in appetite - - 1  Feeling bad or failure about yourself  - - 0  Trouble concentrating - - 0  Moving slowly or fidgety/restless - - 0  Suicidal thoughts - - 0  PHQ-9 Score - - 5  Difficult doing work/chores - -  Somewhat difficult     Fall Risk: Fall Risk  11/15/2015 08/08/2015 05/08/2015  Falls in the past year? No No No      Functional Status Survey: Is the patient deaf or have difficulty hearing?: No Does the patient have difficulty seeing, even when wearing glasses/contacts?: No Does the patient have difficulty concentrating, remembering, or making decisions?: No Does the patient have difficulty walking or climbing stairs?: No Does the patient have difficulty dressing or bathing?: No Does the patient have difficulty doing errands alone such as visiting a doctor's office or shopping?: No    Assessment & Plan  1. Hypertension, benign  - losartan (COZAAR) 100 MG tablet; Take 1 tablet (100 mg total) by mouth daily.  Dispense: 30 tablet; Refill: 5 - amLODipine (NORVASC) 5 MG tablet; Take 1 tablet (5 mg total) by mouth daily.  Dispense: 30 tablet; Refill: 5 - Estimated GFR  2. Major depression, chronic (HCC)  Doing well in remission  - desvenlafaxine (PRISTIQ) 50 MG 24 hr tablet; Take 1 tablet (50 mg total) by mouth daily.  Dispense: 30 tablet; Refill: 5 - ARIPiprazole (ABILIFY) 2 MG tablet; Take 1 tablet (2 mg total) by mouth at bedtime.  Dispense: 30 tablet; Refill: 5 - ALPRAZolam (XANAX) 0.5  MG tablet; TAKE ONE TABLET TWICE DAILY AS NEEDED FOR ANXIETY AND OR SLEEP  Dispense: 45 tablet; Refill: 2  3. Chronic insomnia  - ALPRAZolam (XANAX) 0.5 MG tablet; TAKE ONE TABLET TWICE DAILY AS NEEDED FOR ANXIETY AND OR SLEEP  Dispense: 45 tablet; Refill: 2  4. Metabolic syndrome  Denies polyphagia, polydipsia or polyuria  5. COPD, mild ([place])  Discussed importance of quitting smoking, but she is not ready at this time  6. IBS (irritable bowel syndrome)  - amitriptyline (ELAVIL) 10 MG tablet; Take 1 tablet (10 mg total) by mouth at bedtime.  Dispense: 30 tablet; Refill: 5  7. Vitamin D deficiency  - VITAMIN D 25 Hydroxy (Vit-D Deficiency, Fractures)  8. Hyperglycemia  - Hemoglobin A1c  9. Chronic low back pain  Continue follow up at the pain clinic  10. Gastroesophageal reflux disease without esophagitis  Doing well on prn medication   11. Long-term use of high-risk medication  - AST - ALT - Potassium   12. Elbow pain, left  Likely from overuse, seems to be medial left epicondylitis, advised brace and naproxen, call back if no improvement - naproxen (NAPROSYN) 500 MG tablet; Take 1 tablet (500 mg total) by mouth 2 (two) times daily with a meal.  Dispense: 30 tablet; Refill: 0  13. Tobacco use  Not read to quit

## 2015-11-16 LAB — GLOM FILT RATE, ESTIMATED
Creatinine, Ser: 0.61 mg/dL (ref 0.57–1.00)
GFR calc Af Amer: 121 mL/min/{1.73_m2} (ref >59)
GFR calc non Af Amer: 105 mL/min/{1.73_m2} (ref >59)

## 2015-11-16 LAB — ALT: ALT: 19 IU/L (ref 0–32)

## 2015-11-16 LAB — HEMOGLOBIN A1C
ESTIMATED AVERAGE GLUCOSE: 120 mg/dL
Hgb A1c MFr Bld: 5.8 % — ABNORMAL HIGH (ref 4.8–5.6)

## 2015-11-16 LAB — VITAMIN D 25 HYDROXY (VIT D DEFICIENCY, FRACTURES): VIT D 25 HYDROXY: 33.7 ng/mL (ref 30.0–100.0)

## 2015-11-16 LAB — POTASSIUM: POTASSIUM: 4.7 mmol/L (ref 3.5–5.2)

## 2015-11-16 LAB — AST: AST: 15 IU/L (ref 0–40)

## 2015-11-19 ENCOUNTER — Telehealth: Payer: Self-pay | Admitting: Family Medicine

## 2015-11-19 NOTE — Telephone Encounter (Signed)
Returned this patient call and she was encouraged to monitor what she eats and to incorporate more physical activities.

## 2015-11-19 NOTE — Telephone Encounter (Signed)
PLEASE CALL PT BACK. RETURNING YOUR CALL.

## 2015-12-05 DIAGNOSIS — G894 Chronic pain syndrome: Secondary | ICD-10-CM | POA: Diagnosis not present

## 2015-12-05 DIAGNOSIS — M25511 Pain in right shoulder: Secondary | ICD-10-CM | POA: Diagnosis not present

## 2015-12-05 DIAGNOSIS — M25551 Pain in right hip: Secondary | ICD-10-CM | POA: Diagnosis not present

## 2015-12-05 DIAGNOSIS — M545 Low back pain: Secondary | ICD-10-CM | POA: Diagnosis not present

## 2015-12-05 DIAGNOSIS — Z79891 Long term (current) use of opiate analgesic: Secondary | ICD-10-CM | POA: Diagnosis not present

## 2015-12-05 DIAGNOSIS — M25552 Pain in left hip: Secondary | ICD-10-CM | POA: Diagnosis not present

## 2015-12-05 DIAGNOSIS — M542 Cervicalgia: Secondary | ICD-10-CM | POA: Diagnosis not present

## 2015-12-05 DIAGNOSIS — M25512 Pain in left shoulder: Secondary | ICD-10-CM | POA: Diagnosis not present

## 2016-01-03 DIAGNOSIS — M25552 Pain in left hip: Secondary | ICD-10-CM | POA: Diagnosis not present

## 2016-01-03 DIAGNOSIS — G894 Chronic pain syndrome: Secondary | ICD-10-CM | POA: Diagnosis not present

## 2016-01-03 DIAGNOSIS — M25511 Pain in right shoulder: Secondary | ICD-10-CM | POA: Diagnosis not present

## 2016-01-03 DIAGNOSIS — M25551 Pain in right hip: Secondary | ICD-10-CM | POA: Diagnosis not present

## 2016-01-03 DIAGNOSIS — M545 Low back pain: Secondary | ICD-10-CM | POA: Diagnosis not present

## 2016-01-03 DIAGNOSIS — M542 Cervicalgia: Secondary | ICD-10-CM | POA: Diagnosis not present

## 2016-01-03 DIAGNOSIS — M25512 Pain in left shoulder: Secondary | ICD-10-CM | POA: Diagnosis not present

## 2016-01-03 DIAGNOSIS — Z79891 Long term (current) use of opiate analgesic: Secondary | ICD-10-CM | POA: Diagnosis not present

## 2016-01-09 ENCOUNTER — Other Ambulatory Visit: Payer: Self-pay

## 2016-01-09 DIAGNOSIS — F329 Major depressive disorder, single episode, unspecified: Secondary | ICD-10-CM

## 2016-01-09 DIAGNOSIS — F5104 Psychophysiologic insomnia: Secondary | ICD-10-CM

## 2016-01-09 MED ORDER — ALPRAZOLAM 0.5 MG PO TABS
ORAL_TABLET | ORAL | Status: DC
Start: 2016-01-09 — End: 2016-02-19

## 2016-01-09 NOTE — Telephone Encounter (Signed)
Patient requesting refill. 

## 2016-01-23 ENCOUNTER — Other Ambulatory Visit: Payer: Self-pay

## 2016-02-03 DIAGNOSIS — M542 Cervicalgia: Secondary | ICD-10-CM | POA: Diagnosis not present

## 2016-02-03 DIAGNOSIS — M25551 Pain in right hip: Secondary | ICD-10-CM | POA: Diagnosis not present

## 2016-02-03 DIAGNOSIS — G8929 Other chronic pain: Secondary | ICD-10-CM | POA: Diagnosis not present

## 2016-02-03 DIAGNOSIS — M25552 Pain in left hip: Secondary | ICD-10-CM | POA: Diagnosis not present

## 2016-02-03 DIAGNOSIS — M545 Low back pain: Secondary | ICD-10-CM | POA: Diagnosis not present

## 2016-02-03 DIAGNOSIS — G894 Chronic pain syndrome: Secondary | ICD-10-CM | POA: Diagnosis not present

## 2016-02-19 ENCOUNTER — Ambulatory Visit
Admission: RE | Admit: 2016-02-19 | Discharge: 2016-02-19 | Disposition: A | Discharge status: Home / Self Care | Payer: Medicare Other | Source: Ambulatory Visit | Attending: Family Medicine | Admitting: Family Medicine

## 2016-02-19 ENCOUNTER — Encounter: Payer: Self-pay | Admitting: Family Medicine

## 2016-02-19 ENCOUNTER — Ambulatory Visit (INDEPENDENT_AMBULATORY_CARE_PROVIDER_SITE_OTHER): Payer: Medicare Other | Admitting: Family Medicine

## 2016-02-19 VITALS — BP 120/78 | HR 95 | Temp 98.2°F | Resp 16 | Ht 68.0 in | Wt 202.5 lb

## 2016-02-19 DIAGNOSIS — M25562 Pain in left knee: Secondary | ICD-10-CM | POA: Insufficient documentation

## 2016-02-19 DIAGNOSIS — M545 Low back pain: Secondary | ICD-10-CM

## 2016-02-19 DIAGNOSIS — K219 Gastro-esophageal reflux disease without esophagitis: Secondary | ICD-10-CM | POA: Diagnosis not present

## 2016-02-19 DIAGNOSIS — J449 Chronic obstructive pulmonary disease, unspecified: Secondary | ICD-10-CM

## 2016-02-19 DIAGNOSIS — E8881 Metabolic syndrome: Secondary | ICD-10-CM

## 2016-02-19 DIAGNOSIS — M25561 Pain in right knee: Secondary | ICD-10-CM | POA: Insufficient documentation

## 2016-02-19 DIAGNOSIS — G8929 Other chronic pain: Secondary | ICD-10-CM | POA: Diagnosis not present

## 2016-02-19 DIAGNOSIS — F329 Major depressive disorder, single episode, unspecified: Secondary | ICD-10-CM | POA: Diagnosis not present

## 2016-02-19 DIAGNOSIS — M170 Bilateral primary osteoarthritis of knee: Secondary | ICD-10-CM | POA: Diagnosis not present

## 2016-02-19 DIAGNOSIS — K589 Irritable bowel syndrome without diarrhea: Secondary | ICD-10-CM

## 2016-02-19 DIAGNOSIS — G4700 Insomnia, unspecified: Secondary | ICD-10-CM

## 2016-02-19 DIAGNOSIS — I10 Essential (primary) hypertension: Secondary | ICD-10-CM | POA: Diagnosis not present

## 2016-02-19 DIAGNOSIS — F5104 Psychophysiologic insomnia: Secondary | ICD-10-CM

## 2016-02-19 MED ORDER — ALPRAZOLAM 0.5 MG PO TABS: ORAL_TABLET | ORAL | Status: DC

## 2016-02-19 MED ORDER — ACETAMINOPHEN 500 MG PO TABS: 500.0000 mg | ORAL_TABLET | Freq: Four times a day (QID) | ORAL | Status: DC | PRN

## 2016-02-19 NOTE — Progress Notes (Signed)
Name: [name]   MRN: [id number]    DOB: 03/12/1961   Date:02/19/2016       Progress Note  Subjective  Chief Complaint  Chief Complaint  Patient presents with  . Hypertension    patient is here for his 99-month f/u  . Depression    major, chronic  . Metabolic syndrome  . COPD    mild  . Irritable Bowel Syndrome  . vitamin d deficiency  . Hyperglycemia  . Back Pain    chronic low back pain  . Gastroesophageal Reflux  . Long-term use high-risk medication  . Tobacco use  . Knee Pain    patient stated that for the past month she has been having bilateral knee pain. she stated they are very sore.    HPI  HTN: taking Norvasc and Losartan, no side effects, no chest pain, SOB or palpitation, compliant with medication. BP at home is around 130's/80's, but not checking it very often lately.  GERD: well controlled, only taking Ranitidine prn, symptoms are now very sporadic and described as burning sensation, usually after she eats something greasy.   Insomnia: taking Alprazolam, qhs , usually one a half pills per night and is helping her fall and stay asleep. No side effects of medication  Depression Major: youngest daughter is now at [employer] and she still misses her but adjusting well to the change.  She is taking medication, she feels like she is in remission. No crying spells recently. She is on medication for maintenance   COPD: she is still smoking and she is not ready to quit at this time, no cough, no wheezing or SOB, she was using Advair but stopped months ago because symptoms resolved.   Eczema: currently no rashes, symptoms usually present in the Spring and Summer, but so far she is doing well.  She uses medication prn, no itching.   Chronic back pain: she goes to [facility] in [place] and the pain has been stable at around 5/10 but can get higher, no side effects of medication  Bilateral knee pain: going on for about one month, no trauma or  change in activity, no swelling, it grinds when walking. No instability. Pain is described as aching and it can be 7/10 in intensity, better with rest and worse with activity.    Patient Active Problem List   Diagnosis Date Noted  . COPD, mild ([place]) 05/08/2015  . Vitamin D deficiency 05/07/2015  . Metabolic syndrome 123XX123  . Eczema 05/07/2015  . Chronic insomnia 05/07/2015  . Hypertension, benign 05/07/2015  . Dyslipidemia 05/07/2015  . Hyperglycemia 05/07/2015  . Chronic low back pain 05/07/2015  . IBS (irritable bowel syndrome) 05/07/2015  . Gastroesophageal reflux disease without esophagitis 05/07/2015  . Major depression, chronic ([place]) 05/07/2015  . Menopausal syndrome (hot flashes) 05/07/2015  . History of postmenopausal bleeding 01/14/2015    Past Surgical History  Procedure Laterality Date  . Cholecystectomy    . Endometrial ablation    . Tubal ligation    . Tonsillectomy    . Breast lumpectomy    . Spinal fusion    . Cervical discectomy      x 2  . Dilatation & curettage/hysteroscopy with myosure N/A 03/21/2015    Procedure: DILATATION & CURETTAGE/HYSTEROSCOPY;  Surgeon: Aletha Halim, MD;  Location: [facility] ORS;  Service: Gynecology;  Laterality: N/A;    History reviewed. No pertinent family history.  Social History   Social History  . Marital Status: Married  Spouse Name: N/A  . Number of Children: N/A  . Years of Education: N/A   Occupational History  . Not on file.   Social History Main Topics  . Smoking status: Current Every Day Smoker -- 6.00 packs/day for 10 years    Types: Cigarettes  . Smokeless tobacco: Current User  . Alcohol Use: No  . Drug Use: No  . Sexual Activity: Yes   Other Topics Concern  . Not on file   Social History Narrative     Current outpatient prescriptions:  .  ALPRAZolam (XANAX) 0.5 MG tablet, TAKE ONE TABLET TWICE DAILY AS NEEDED FOR ANXIETY AND OR SLEEP, Disp: 45 tablet, Rfl: 0 .  amitriptyline (ELAVIL) 10 MG  tablet, Take 1 tablet (10 mg total) by mouth at bedtime., Disp: 30 tablet, Rfl: 5 .  amLODipine (NORVASC) 5 MG tablet, Take 1 tablet (5 mg total) by mouth daily., Disp: 30 tablet, Rfl: 5 .  ARIPiprazole (ABILIFY) 2 MG tablet, Take 1 tablet (2 mg total) by mouth at bedtime., Disp: 30 tablet, Rfl: 5 .  desoximetasone (TOPICORT) 0.25 % cream, Apply 1 application topically 2 (two) times daily as needed., Disp: 60 g, Rfl: 1 .  desvenlafaxine (PRISTIQ) 50 MG 24 hr tablet, , Disp: , Rfl:  .  EVZIO 0.4 MG/0.4ML SOAJ, , Disp: , Rfl:  .  gabapentin (NEURONTIN) 300 MG capsule, Take 1 capsule by mouth every 12 (twelve) hours., Disp: , Rfl:  .  losartan (COZAAR) 100 MG tablet, Take 1 tablet (100 mg total) by mouth daily., Disp: 30 tablet, Rfl: 5 .  oxyCODONE (ROXICODONE) 15 MG immediate release tablet, Take 1 tablet by mouth every 4 (four) hours., Disp: , Rfl:  .  PRISTIQ 50 MG 24 hr tablet, Take 1 tablet (50 mg total) by mouth daily., Disp: , Rfl: 5 .  ranitidine (ZANTAC) 300 MG tablet, Take 1 tablet (300 mg total) by mouth daily as needed for heartburn., Disp: 30 tablet, Rfl: 5 .  tiZANidine (ZANAFLEX) 4 MG tablet, Take 1 tablet by mouth every 12 (twelve) hours., Disp: , Rfl:   No Known Allergies   ROS  Constitutional: Negative for fever , positive for mild weight change.  Respiratory: Negative for cough and shortness of breath.   Cardiovascular: Negative for chest pain or palpitations.  Gastrointestinal: Negative for abdominal pain, no bowel changes.  Musculoskeletal: Negative for gait problem or joint swelling.  Skin: Negative for rash.  Neurological: Negative for dizziness , occasionally has a headache.  No other specific complaints in a complete review of systems (except as listed in HPI above). Objective  Filed Vitals:   02/19/16 0940  BP: 120/78  Pulse: 95  Temp: 98.2 F (36.8 C)  TempSrc: Oral  Resp: 16  Height: 5\' 8"  (1.727 m)  Weight: 202 lb 8 oz (91.853 kg)  SpO2: 98%    Body  mass index is 30.8 kg/(m^2).  Physical Exam  Constitutional: Patient appears well-developed and well-nourished. Obese  No distress.  HEENT: head atraumatic, normocephalic, pupils equal and reactive to light, neck supple, throat within normal limits Cardiovascular: Normal rate, regular rhythm and normal heart sounds.  No murmur heard. No BLE edema. Pulmonary/Chest: Effort normal and breath sounds normal. No respiratory distress. Abdominal: Soft.  There is no tenderness. Psychiatric: Patient has a normal mood and affect. behavior is normal. Judgment and thought content normal. Muscular Skeletal: normal rom of knee, no effusion, erythema or increase in warmth, grinding with extension of both knees.   PHQ2/9:  Depression screen [facility] 2/9 02/19/2016 11/15/2015 08/08/2015 05/08/2015  Decreased Interest 0 0 0 1  Down, Depressed, Hopeless 0 0 0 1  PHQ - 2 Score 0 0 0 2  Altered sleeping - - - 1  Tired, decreased energy - - - 1  Change in appetite - - - 1  Feeling bad or failure about yourself  - - - 0  Trouble concentrating - - - 0  Moving slowly or fidgety/restless - - - 0  Suicidal thoughts - - - 0  PHQ-9 Score - - - 5  Difficult doing work/chores - - - Somewhat difficult     Fall Risk: Fall Risk  02/19/2016 11/15/2015 08/08/2015 05/08/2015  Falls in the past year? No No No No      Functional Status Survey: Is the patient deaf or have difficulty hearing?: No Does the patient have difficulty seeing, even when wearing glasses/contacts?: No Does the patient have difficulty concentrating, remembering, or making decisions?: No Does the patient have difficulty walking or climbing stairs?: Yes (due to bilateral knee pain) Does the patient have difficulty dressing or bathing?: No Does the patient have difficulty doing errands alone such as visiting a doctor's office or shopping?: No    Assessment & Plan  1. Hypertension, benign  Well controlled , continue current medication  2. Chronic  insomnia  - ALPRAZolam (XANAX) 0.5 MG tablet; TAKE ONE TABLET TWICE DAILY AS NEEDED FOR ANXIETY AND OR SLEEP  Dispense: 45 tablet; Refill: 2  3. Metabolic syndrome  Doing well losing weight   4. COPD, mild ([place])  Not ready to quit smoking, off medication at this time  5. IBS (irritable bowel syndrome)  stable  6. Chronic low back pain  Continue follow up with pain clinic  7. Major depression, chronic ([facility])  - ALPRAZolam (XANAX) 0.5 MG tablet; TAKE ONE TABLET TWICE DAILY AS NEEDED FOR ANXIETY AND OR SLEEP  Dispense: 45 tablet; Refill: 2  8. Gastroesophageal reflux disease without esophagitis  stable  9. Bilateral knee pain  - DG Knee Bilateral Standing AP; Future - acetaminophen (TYLENOL) 500 MG tablet; Take 1 tablet (500 mg total) by mouth every 6 (six) hours as needed.  Dispense: 30 tablet; Refill: 2

## 2016-03-04 DIAGNOSIS — M545 Low back pain: Secondary | ICD-10-CM | POA: Diagnosis not present

## 2016-03-04 DIAGNOSIS — G8929 Other chronic pain: Secondary | ICD-10-CM | POA: Diagnosis not present

## 2016-03-04 DIAGNOSIS — M542 Cervicalgia: Secondary | ICD-10-CM | POA: Diagnosis not present

## 2016-03-04 DIAGNOSIS — G894 Chronic pain syndrome: Secondary | ICD-10-CM | POA: Diagnosis not present

## 2016-03-20 ENCOUNTER — Telehealth: Payer: Self-pay | Admitting: Family Medicine

## 2016-03-20 NOTE — Telephone Encounter (Signed)
errenous °

## 2016-04-03 DIAGNOSIS — G603 Idiopathic progressive neuropathy: Secondary | ICD-10-CM | POA: Diagnosis not present

## 2016-04-03 DIAGNOSIS — G894 Chronic pain syndrome: Secondary | ICD-10-CM | POA: Diagnosis not present

## 2016-04-03 DIAGNOSIS — G8929 Other chronic pain: Secondary | ICD-10-CM | POA: Diagnosis not present

## 2016-04-03 DIAGNOSIS — G541 Lumbosacral plexus disorders: Secondary | ICD-10-CM | POA: Diagnosis not present

## 2016-04-03 DIAGNOSIS — Z79891 Long term (current) use of opiate analgesic: Secondary | ICD-10-CM | POA: Diagnosis not present

## 2016-04-03 DIAGNOSIS — M545 Low back pain: Secondary | ICD-10-CM | POA: Diagnosis not present

## 2016-04-03 DIAGNOSIS — M542 Cervicalgia: Secondary | ICD-10-CM | POA: Diagnosis not present

## 2016-04-10 ENCOUNTER — Telehealth: Payer: Self-pay | Admitting: Family Medicine

## 2016-04-10 NOTE — Telephone Encounter (Signed)
Contacted the pharmacist at [facility] and after reviewing the last rx that was submitted on 02/19/16, he realized that it was put in incorrectly and that the patient had 1 refill left. He stated that he will fix it and that they will refill this for the patient.

## 2016-04-10 NOTE — Telephone Encounter (Signed)
At last visit patient was told to return in July, however her xanex is not lasting that long. She was given 45 tablets with 2 refills. When she went to get her refill they told her that she did not have anymore . Please advise. Patient is completely out

## 2016-05-04 DIAGNOSIS — M25551 Pain in right hip: Secondary | ICD-10-CM | POA: Diagnosis not present

## 2016-05-04 DIAGNOSIS — M545 Low back pain: Secondary | ICD-10-CM | POA: Diagnosis not present

## 2016-05-04 DIAGNOSIS — M25511 Pain in right shoulder: Secondary | ICD-10-CM | POA: Diagnosis not present

## 2016-05-04 DIAGNOSIS — M25552 Pain in left hip: Secondary | ICD-10-CM | POA: Diagnosis not present

## 2016-05-04 DIAGNOSIS — M25512 Pain in left shoulder: Secondary | ICD-10-CM | POA: Diagnosis not present

## 2016-05-04 DIAGNOSIS — G894 Chronic pain syndrome: Secondary | ICD-10-CM | POA: Diagnosis not present

## 2016-05-04 DIAGNOSIS — M542 Cervicalgia: Secondary | ICD-10-CM | POA: Diagnosis not present

## 2016-05-04 DIAGNOSIS — Z79891 Long term (current) use of opiate analgesic: Secondary | ICD-10-CM | POA: Diagnosis not present

## 2016-05-13 ENCOUNTER — Telehealth: Payer: Self-pay | Admitting: Family Medicine

## 2016-05-13 NOTE — Telephone Encounter (Signed)
Patient was informed of Dr. Ancil Boozer message and said ok.

## 2016-05-13 NOTE — Telephone Encounter (Signed)
PT SAID THAT SHE IS STILL DOWN IN HER BACK WITH A LOT OF PAIN AND WANTS TO KNOW IF SHE CAN COME IN AND GET AN INJECTION LIKE SHE HAS HAD BEFORE. DR HAS SOMETHING ON THE 20TH BUT WANTED TO GET THIS OK FIRST AND THE PATIENT SAID THAT WAS ALONG TIME TO WAIT.

## 2016-05-13 NOTE — Telephone Encounter (Signed)
I recommend follow up with the pain clinic, or at least discuss with them first

## 2016-05-13 NOTE — Telephone Encounter (Signed)
Referral must be placed but there is no guarantee that she will be accepted as a patient.

## 2016-05-13 NOTE — Telephone Encounter (Signed)
She goes to [facility] in [place]

## 2016-05-14 ENCOUNTER — Other Ambulatory Visit: Payer: Self-pay | Admitting: Family Medicine

## 2016-05-20 ENCOUNTER — Ambulatory Visit: Payer: Medicare Other | Admitting: Family Medicine

## 2016-05-27 ENCOUNTER — Ambulatory Visit: Payer: Medicare Other | Admitting: Family Medicine

## 2016-05-29 ENCOUNTER — Encounter: Payer: Self-pay | Admitting: Family Medicine

## 2016-05-29 ENCOUNTER — Ambulatory Visit (INDEPENDENT_AMBULATORY_CARE_PROVIDER_SITE_OTHER): Payer: Medicare Other | Admitting: Family Medicine

## 2016-05-29 VITALS — BP 116/68 | HR 100 | Temp 98.8°F | Resp 18 | Ht 68.5 in | Wt 212.3 lb

## 2016-05-29 DIAGNOSIS — G8929 Other chronic pain: Secondary | ICD-10-CM

## 2016-05-29 DIAGNOSIS — M25561 Pain in right knee: Secondary | ICD-10-CM | POA: Diagnosis not present

## 2016-05-29 DIAGNOSIS — I10 Essential (primary) hypertension: Secondary | ICD-10-CM

## 2016-05-29 DIAGNOSIS — E8881 Metabolic syndrome: Secondary | ICD-10-CM | POA: Diagnosis not present

## 2016-05-29 DIAGNOSIS — L309 Dermatitis, unspecified: Secondary | ICD-10-CM | POA: Diagnosis not present

## 2016-05-29 DIAGNOSIS — G4700 Insomnia, unspecified: Secondary | ICD-10-CM | POA: Diagnosis not present

## 2016-05-29 DIAGNOSIS — M545 Low back pain, unspecified: Secondary | ICD-10-CM

## 2016-05-29 DIAGNOSIS — F5104 Psychophysiologic insomnia: Secondary | ICD-10-CM

## 2016-05-29 DIAGNOSIS — J449 Chronic obstructive pulmonary disease, unspecified: Secondary | ICD-10-CM

## 2016-05-29 DIAGNOSIS — E669 Obesity, unspecified: Secondary | ICD-10-CM

## 2016-05-29 DIAGNOSIS — M25562 Pain in left knee: Secondary | ICD-10-CM

## 2016-05-29 DIAGNOSIS — M79641 Pain in right hand: Secondary | ICD-10-CM | POA: Diagnosis not present

## 2016-05-29 DIAGNOSIS — F329 Major depressive disorder, single episode, unspecified: Secondary | ICD-10-CM

## 2016-05-29 DIAGNOSIS — K589 Irritable bowel syndrome without diarrhea: Secondary | ICD-10-CM | POA: Diagnosis not present

## 2016-05-29 MED ORDER — AMLODIPINE BESYLATE 2.5 MG PO TABS: 2.5000 mg | ORAL_TABLET | Freq: Every day | ORAL | 2 refills | Status: DC

## 2016-05-29 MED ORDER — ALPRAZOLAM 0.5 MG PO TABS: ORAL_TABLET | ORAL | 2 refills | Status: DC

## 2016-05-29 MED ORDER — AMITRIPTYLINE HCL 10 MG PO TABS: 10.0000 mg | ORAL_TABLET | Freq: Every day | ORAL | 5 refills | Status: DC

## 2016-05-29 MED ORDER — PRISTIQ 50 MG PO TB24: 50.0000 mg | ORAL_TABLET | Freq: Every day | ORAL | 5 refills | Status: DC

## 2016-05-29 MED ORDER — ARIPIPRAZOLE 2 MG PO TABS
2.0000 mg | ORAL_TABLET | Freq: Every day | ORAL | 5 refills | Status: DC
Start: 2016-05-29 — End: 2016-08-26

## 2016-05-29 MED ORDER — TRIAMCINOLONE ACETONIDE 0.025 % EX CREA: TOPICAL_CREAM | Freq: Two times a day (BID) | CUTANEOUS | 0 refills | Status: DC

## 2016-05-29 MED ORDER — LOSARTAN POTASSIUM-HCTZ 100-25 MG PO TABS: 1.0000 | ORAL_TABLET | Freq: Every day | ORAL | 2 refills | Status: DC

## 2016-05-29 NOTE — Progress Notes (Signed)
Name: [name]   MRN: [id number]    DOB: 1961-04-15   Date:05/29/2016       Progress Note  Subjective  Chief Complaint  Chief Complaint  Patient presents with  . Medication Refill    3 month F/U   . Hypertension    Edema in bilateral feet  . Depression    Patient has gained 10 pounds since last visit, stable   . COPD    Mild and no complains of symptoms  . Knee Pain    Still sore  . Hand Pain    Onset-couple of months, Right hand stiffness and unable to close with a tight fist.   . Gastroesophageal Reflux    Improving with medication but not as bad as it was before medication, still gets heartburn at night. once or twice weekly    HPI   HTN: taking Norvasc and Losartan, she has been swelling on both feet now, but no chest pain, SOB or palpitation, compliant with medication. BP is not getting checked at home, but it is at goal today. We will adjust dose of medications and add HCTZ to improve side effects  GERD: well controlled, only taking Ranitidine prn, symptoms are now very sporadic and described as burning sensation, usually after she eats something greasy, also avoids spicy meals.   Insomnia: taking Alprazolam every night, usually one a half pills per night and is helping her fall and stay asleep. No side effects of medication  Depression Major: youngest daughter was at [employer] and she was struggling, but she is back home now, still has down days but not as often.  She is taking medication, she does not want referral to Psychiatrist.  No crying spells recently.   COPD: she is still smoking and she is not ready to quit at this time, no cough, no wheezing or SOB, she was using Advair but only prn, also using Spiriva prn. Symptoms worse with humidity.   Eczema: currently no rashes, symptoms usually present in the Spring and Summer. She is using medication prn with good control  Chronic back pain: she goes to [facility] in [place] and the  pain has been stable at around 6/10 but can get higher, no side effects of medication  Bilateral knee pain: going on for about four  months, no trauma or change in activity, no swelling, it grinds when walking. No instability. Pain is described as aching and it can be 4/10 in intensity, better with rest and worse with activity.   Right hand pain: she states her right hand has been sore, and is difficult to make a fist, worse on right index finger, no trauma. She states it is all day, no redness, numbness or increase in warmth. She is left handed. Discussed referral to Ortho but she would like to hold off. May try Aleve for a few days to see if symptoms improves  Obesity: she has gained 10 lbs, not exercising, no motivation, explained importance of life style modification. She needs to stop eating high caloric meals ( she likes to eat hamburger's and fried chicken ) she will try eating salads again  Patient Active Problem List   Diagnosis Date Noted  . Osteoarthritis, knee 02/19/2016  . COPD, mild ([place]) 05/08/2015  . Vitamin D deficiency 05/07/2015  . Metabolic syndrome 123XX123  . Eczema 05/07/2015  . Chronic insomnia 05/07/2015  . Hypertension, benign 05/07/2015  . Dyslipidemia 05/07/2015  . Hyperglycemia 05/07/2015  . Chronic low back  pain 05/07/2015  . IBS (irritable bowel syndrome) 05/07/2015  . Gastroesophageal reflux disease without esophagitis 05/07/2015  . Major depression, chronic ([place]) 05/07/2015  . Menopausal syndrome (hot flashes) 05/07/2015  . History of postmenopausal bleeding 01/14/2015    Past Surgical History:  Procedure Laterality Date  . BREAST LUMPECTOMY    . CERVICAL DISCECTOMY     x 2  . CHOLECYSTECTOMY    . DILATATION & CURETTAGE/HYSTEROSCOPY WITH MYOSURE N/A 03/21/2015   Procedure: DILATATION & CURETTAGE/HYSTEROSCOPY;  Surgeon: Aletha Halim, MD;  Location: [facility] ORS;  Service: Gynecology;  Laterality: N/A;  . ENDOMETRIAL ABLATION    . SPINAL FUSION     . TONSILLECTOMY    . TUBAL LIGATION      History reviewed. No pertinent family history.  Social History   Social History  . Marital status: Married    Spouse name: N/A  . Number of children: N/A  . Years of education: N/A   Occupational History  . Not on file.   Social History Main Topics  . Smoking status: Current Every Day Smoker    Packs/day: 6.00    Years: 10.00    Types: Cigarettes  . Smokeless tobacco: Current User  . Alcohol use No  . Drug use: No  . Sexual activity: Yes   Other Topics Concern  . Not on file   Social History Narrative  . No narrative on file     Current Outpatient Prescriptions:  .  acetaminophen (TYLENOL) 500 MG tablet, Take 1 tablet (500 mg total) by mouth every 6 (six) hours as needed., Disp: 30 tablet, Rfl: 2 .  ALPRAZolam (XANAX) 0.5 MG tablet, TAKE ONE TABLET TWICE DAILY AS NEEDED FOR ANXIETY AND OR SLEEP, Disp: 45 tablet, Rfl: 2 .  amitriptyline (ELAVIL) 10 MG tablet, Take 1 tablet (10 mg total) by mouth at bedtime., Disp: 30 tablet, Rfl: 5 .  ARIPiprazole (ABILIFY) 2 MG tablet, Take 1 tablet (2 mg total) by mouth at bedtime., Disp: 30 tablet, Rfl: 5 .  EVZIO 0.4 MG/0.4ML SOAJ, , Disp: , Rfl:  .  gabapentin (NEURONTIN) 300 MG capsule, Take 1 capsule by mouth every 12 (twelve) hours., Disp: , Rfl:  .  oxyCODONE (ROXICODONE) 15 MG immediate release tablet, Take 1 tablet by mouth every 4 (four) hours., Disp: , Rfl:  .  PRISTIQ 50 MG 24 hr tablet, Take 1 tablet (50 mg total) by mouth daily., Disp: 30 tablet, Rfl: 5 .  ranitidine (ZANTAC) 300 MG tablet, Take 1 tablet (300 mg total) by mouth daily as needed for heartburn., Disp: 30 tablet, Rfl: 5 .  tiZANidine (ZANAFLEX) 4 MG tablet, Take 1 tablet by mouth every 12 (twelve) hours., Disp: , Rfl:  .  triamcinolone (KENALOG) 0.025 % cream, Apply topically 2 (two) times daily., Disp: 453.6 g, Rfl: 0 .  amLODipine (NORVASC) 2.5 MG tablet, Take 1 tablet (2.5 mg total) by mouth daily., Disp: 30  tablet, Rfl: 2 .  losartan-hydrochlorothiazide (HYZAAR) 100-25 MG tablet, Take 1 tablet by mouth daily., Disp: 30 tablet, Rfl: 2  No Known Allergies   ROS  Constitutional: Negative for fever , positive weight change.  Respiratory: Negative for cough and shortness of breath.   Cardiovascular: Negative for chest pain or palpitations.  Gastrointestinal: Negative for abdominal pain, no bowel changes.  Musculoskeletal: Negative for gait problem or joint swelling.  Skin: negative for rash.  Neurological: Negative for dizziness or headache.  No other specific complaints in a complete review of systems (except as listed  in HPI above).  Objective  Vitals:   05/29/16 1048  BP: 116/68  Pulse: 100  Resp: 18  Temp: 98.8 F (37.1 C)  TempSrc: Oral  SpO2: 97%  Weight: 212 lb 4.8 oz (96.3 kg)  Height: 5' 8.5" (1.74 m)    Body mass index is 31.81 kg/m.  Physical Exam  Constitutional: Patient appears well-developed and well-nourished. Obese No distress.  HEENT: head atraumatic, normocephalic, pupils equal and reactive to light,  neck supple, throat within normal limits Cardiovascular: Normal rate, regular rhythm and normal heart sounds.  No murmur heard. Trace BLE edema. Pulmonary/Chest: Effort normal and breath sounds normal. No respiratory distress. Abdominal: Soft.  There is no tenderness. Psychiatric: Patient has a normal mood and affect. behavior is normal. Judgment and thought content normal. Muscular Skeletal: pain during palpation of lumbar spine, no synovitis of hand, grinding with extension of knees Skin: no rashes right now  PHQ2/9: Depression screen [facility] 2/9 05/29/2016 02/19/2016 11/15/2015 08/08/2015 05/08/2015  Decreased Interest 0 0 0 0 1  Down, Depressed, Hopeless 0 0 0 0 1  PHQ - 2 Score 0 0 0 0 2  Altered sleeping - - - - 1  Tired, decreased energy - - - - 1  Change in appetite - - - - 1  Feeling bad or failure about yourself  - - - - 0  Trouble concentrating - - - - 0   Moving slowly or fidgety/restless - - - - 0  Suicidal thoughts - - - - 0  PHQ-9 Score - - - - 5  Difficult doing work/chores - - - - Somewhat difficult     Fall Risk: Fall Risk  05/29/2016 02/19/2016 11/15/2015 08/08/2015 05/08/2015  Falls in the past year? No No No No No     Functional Status Survey: Is the patient deaf or have difficulty hearing?: No Does the patient have difficulty seeing, even when wearing glasses/contacts?: No Does the patient have difficulty concentrating, remembering, or making decisions?: No Does the patient have difficulty walking or climbing stairs?: No Does the patient have difficulty dressing or bathing?: No Does the patient have difficulty doing errands alone such as visiting a doctor's office or shopping?: No   Assessment & Plan  1. Hypertension, benign  Changing medication to improve swelling - losartan-hydrochlorothiazide (HYZAAR) 100-25 MG tablet; Take 1 tablet by mouth daily.  Dispense: 30 tablet; Refill: 2 - amLODipine (NORVASC) 2.5 MG tablet; Take 1 tablet (2.5 mg total) by mouth daily.  Dispense: 30 tablet; Refill: 2  2. Chronic insomnia  - ALPRAZolam (XANAX) 0.5 MG tablet; TAKE ONE TABLET TWICE DAILY AS NEEDED FOR ANXIETY AND OR SLEEP  Dispense: 45 tablet; Refill: 2  3. COPD, mild ([place])  Advised to use Spiriva daily   4. Metabolic syndrome  Recheck yearly, discussed importance of weight loss and dietary modification   5. Major depression, chronic ([place])  Getting worse, but refuses to change medication or referral to therapist or psychiatrist. She denies suicidal thoughts or ideation. - PRISTIQ 50 MG 24 hr tablet; Take 1 tablet (50 mg total) by mouth daily.  Dispense: 30 tablet; Refill: 5 - ALPRAZolam (XANAX) 0.5 MG tablet; TAKE ONE TABLET TWICE DAILY AS NEEDED FOR ANXIETY AND OR SLEEP  Dispense: 45 tablet; Refill: 2 - ARIPiprazole (ABILIFY) 2 MG tablet; Take 1 tablet (2 mg total) by mouth at bedtime.  Dispense: 30 tablet; Refill:  5  6. Chronic low back pain  Continue follow up with pain clinic  7. IBS (  irritable bowel syndrome)  - amitriptyline (ELAVIL) 10 MG tablet; Take 1 tablet (10 mg total) by mouth at bedtime.  Dispense: 30 tablet; Refill: 5  8. Eczema  - triamcinolone (KENALOG) 0.025 % cream; Apply topically 2 (two) times daily.  Dispense: 453.6 g; Refill: 0  9. Bilateral knee pain  stable  10. Right hand pain  She does not want to see Ortho at this time  11. Obesity  Discussed with the patient the risk posed by an increased BMI. Discussed importance of portion control, calorie counting and at least 150 minutes of physical activity weekly. Avoid sweet beverages and drink more water. Eat at least 6 servings of fruit and vegetables daily

## 2016-06-04 DIAGNOSIS — M25551 Pain in right hip: Secondary | ICD-10-CM | POA: Diagnosis not present

## 2016-06-04 DIAGNOSIS — M25552 Pain in left hip: Secondary | ICD-10-CM | POA: Diagnosis not present

## 2016-06-04 DIAGNOSIS — M545 Low back pain: Secondary | ICD-10-CM | POA: Diagnosis not present

## 2016-06-04 DIAGNOSIS — M25511 Pain in right shoulder: Secondary | ICD-10-CM | POA: Diagnosis not present

## 2016-06-04 DIAGNOSIS — G894 Chronic pain syndrome: Secondary | ICD-10-CM | POA: Diagnosis not present

## 2016-06-04 DIAGNOSIS — M25512 Pain in left shoulder: Secondary | ICD-10-CM | POA: Diagnosis not present

## 2016-06-15 ENCOUNTER — Telehealth: Payer: Self-pay

## 2016-06-15 ENCOUNTER — Other Ambulatory Visit: Payer: Self-pay | Admitting: Family Medicine

## 2016-06-15 DIAGNOSIS — F5104 Psychophysiologic insomnia: Secondary | ICD-10-CM

## 2016-06-15 DIAGNOSIS — F329 Major depressive disorder, single episode, unspecified: Secondary | ICD-10-CM

## 2016-06-15 NOTE — Telephone Encounter (Signed)
Patient requesting refill of Alprazolam of [facility].

## 2016-06-15 NOTE — Telephone Encounter (Signed)
Spoke with patient and she states she accidentally threw away her alprazolam  prescription along with her after visit summary. Patient did not realize it until today when picking up her other prescription when it was not there. I called her pharmacy and her last filled days were 45 pills on 02/05/16, 03/09/16, 04/10/16 and 05/14/16. Pharmacist states they do not have a another prescription on hand for patient. Patient was informed Dr. Ancil Boozer would have to research this prescription first before thinking if she could refill it due to it being a controlled substance.

## 2016-06-16 NOTE — Telephone Encounter (Signed)
Patient notified

## 2016-06-16 NOTE — Telephone Encounter (Signed)
I can't , per protocol, we can't fill it. I am sorry

## 2016-07-02 DIAGNOSIS — M25552 Pain in left hip: Secondary | ICD-10-CM | POA: Diagnosis not present

## 2016-07-02 DIAGNOSIS — G894 Chronic pain syndrome: Secondary | ICD-10-CM | POA: Diagnosis not present

## 2016-07-02 DIAGNOSIS — M542 Cervicalgia: Secondary | ICD-10-CM | POA: Diagnosis not present

## 2016-07-02 DIAGNOSIS — M25551 Pain in right hip: Secondary | ICD-10-CM | POA: Diagnosis not present

## 2016-07-02 DIAGNOSIS — M545 Low back pain: Secondary | ICD-10-CM | POA: Diagnosis not present

## 2016-07-02 DIAGNOSIS — Z79891 Long term (current) use of opiate analgesic: Secondary | ICD-10-CM | POA: Diagnosis not present

## 2016-07-02 DIAGNOSIS — G8929 Other chronic pain: Secondary | ICD-10-CM | POA: Diagnosis not present

## 2016-08-03 DIAGNOSIS — M25552 Pain in left hip: Secondary | ICD-10-CM | POA: Diagnosis not present

## 2016-08-03 DIAGNOSIS — M79662 Pain in left lower leg: Secondary | ICD-10-CM | POA: Diagnosis not present

## 2016-08-03 DIAGNOSIS — M545 Low back pain: Secondary | ICD-10-CM | POA: Diagnosis not present

## 2016-08-03 DIAGNOSIS — G894 Chronic pain syndrome: Secondary | ICD-10-CM | POA: Diagnosis not present

## 2016-08-03 DIAGNOSIS — Z79891 Long term (current) use of opiate analgesic: Secondary | ICD-10-CM | POA: Diagnosis not present

## 2016-08-03 DIAGNOSIS — M25551 Pain in right hip: Secondary | ICD-10-CM | POA: Diagnosis not present

## 2016-08-03 DIAGNOSIS — M542 Cervicalgia: Secondary | ICD-10-CM | POA: Diagnosis not present

## 2016-08-03 DIAGNOSIS — M79661 Pain in right lower leg: Secondary | ICD-10-CM | POA: Diagnosis not present

## 2016-08-26 ENCOUNTER — Encounter: Payer: Self-pay | Admitting: Family Medicine

## 2016-08-26 ENCOUNTER — Other Ambulatory Visit: Payer: Self-pay | Admitting: Family Medicine

## 2016-08-26 ENCOUNTER — Ambulatory Visit (INDEPENDENT_AMBULATORY_CARE_PROVIDER_SITE_OTHER): Payer: Medicare Other | Admitting: Family Medicine

## 2016-08-26 VITALS — BP 118/64 | HR 84 | Temp 98.2°F | Resp 18 | Ht 69.0 in | Wt 209.9 lb

## 2016-08-26 DIAGNOSIS — M79641 Pain in right hand: Secondary | ICD-10-CM

## 2016-08-26 DIAGNOSIS — M25562 Pain in left knee: Secondary | ICD-10-CM

## 2016-08-26 DIAGNOSIS — J449 Chronic obstructive pulmonary disease, unspecified: Secondary | ICD-10-CM | POA: Diagnosis not present

## 2016-08-26 DIAGNOSIS — I10 Essential (primary) hypertension: Secondary | ICD-10-CM

## 2016-08-26 DIAGNOSIS — G8929 Other chronic pain: Secondary | ICD-10-CM | POA: Diagnosis not present

## 2016-08-26 DIAGNOSIS — M5441 Lumbago with sciatica, right side: Secondary | ICD-10-CM

## 2016-08-26 DIAGNOSIS — F5104 Psychophysiologic insomnia: Secondary | ICD-10-CM | POA: Diagnosis not present

## 2016-08-26 DIAGNOSIS — E8881 Metabolic syndrome: Secondary | ICD-10-CM

## 2016-08-26 DIAGNOSIS — F329 Major depressive disorder, single episode, unspecified: Secondary | ICD-10-CM | POA: Diagnosis not present

## 2016-08-26 DIAGNOSIS — Z23 Encounter for immunization: Secondary | ICD-10-CM

## 2016-08-26 DIAGNOSIS — M5442 Lumbago with sciatica, left side: Secondary | ICD-10-CM | POA: Diagnosis not present

## 2016-08-26 DIAGNOSIS — M25561 Pain in right knee: Secondary | ICD-10-CM | POA: Diagnosis not present

## 2016-08-26 DIAGNOSIS — Z1231 Encounter for screening mammogram for malignant neoplasm of breast: Secondary | ICD-10-CM

## 2016-08-26 MED ORDER — QUETIAPINE FUMARATE 25 MG PO TABS: 25.0000 mg | ORAL_TABLET | Freq: Every day | ORAL | 2 refills | Status: DC

## 2016-08-26 NOTE — Progress Notes (Signed)
Name: [name]   MRN: [id number]    DOB: [birth year]   Date:08/26/2016       Progress Note  Subjective  Chief Complaint  Chief Complaint  Patient presents with  . Medication Refill    3 month F/U  . Hypertension  . Knee Pain    Bilateral knee pain has been going on for a while but in the past month has been progressively getting worst. Patient states nothing is helping her knee pain.  . Depression    Well controlled   . Gastroesophageal Reflux    Takes medication as needed.  . Insomnia    Lost prescription on last visit and patient has not been sleeping well, getting only 3-4 hours nightly.     HPI   HTN: taking Norvasc and Losartan/HCTZ. Edema has resolved but bp is in the low range of normal, we will stop Norvasc. Monitor bp at home. She denies chest pain or SOB  GERD: well controlled, only taking Ranitidine prn, symptoms are now very sporadic and described as burning sensation, usually after she eats something greasy, also avoids spicy meals.   Insomnia: she was taking Alprazolam 0.75 mg qhs but lost last rx and has not been able to sleep well for the past 3 months. Discussed options and we will try Seroquel in place of Abilify and Alprazolam to see if it controls her sleep. Melatonin did not help   Depression Major: doing better now, youngest daughter is back home from [place] - because of knee surgery and middle daughter got married in 07/2016. She is taking medication, she does not want referral to Psychiatrist. She denies crying spells, no suicidal thoughts or ideation, feeling better  COPD: she is still smoking and she is not ready to quit at this time, no cough, no wheezing or SOB, she was using Advair but only prn, also using Spiriva prn. Symptoms worse with humidity, and doing better this time of the year  Chronic back pain: she goes to [facility] in [place] and the pain has been stable at around 7/10 on her back, but can get higher,  constipation is controlled with stool softener   Bilateral knee pain: going on for almost one year now, no trauma or change in activity, no swelling, it grinds when walking. No instability. Pain is described as aching and it can be 6/10 in intensity, better with rest and worse with activity. She is taking Tylenol without help. Pain is getting worse, advised referral to Ortho  Right hand pain: she states her right hand has been sore, and is difficult to make a fist, worse on right index finger, no trauma. She states it is all day, no redness, numbness or increase in warmth. She is left handed.  Obesity: she has lost a few pounds since last visit, she is cutting down on portion size.   Patient Active Problem List   Diagnosis Date Noted  . Osteoarthritis, knee 02/19/2016  . COPD, mild ([place]) 05/08/2015  . Vitamin D deficiency 05/07/2015  . Metabolic syndrome 123XX123  . Eczema 05/07/2015  . Chronic insomnia 05/07/2015  . Hypertension, benign 05/07/2015  . Dyslipidemia 05/07/2015  . Hyperglycemia 05/07/2015  . Chronic low back pain 05/07/2015  . IBS (irritable bowel syndrome) 05/07/2015  . Gastroesophageal reflux disease without esophagitis 05/07/2015  . Major depression, chronic ([place]) 05/07/2015  . Menopausal syndrome (hot flashes) 05/07/2015  . History of postmenopausal bleeding 01/14/2015    Past Surgical History:  Procedure Laterality  Date  . BREAST LUMPECTOMY    . CERVICAL DISCECTOMY     x 2  . CHOLECYSTECTOMY    . DILATATION & CURETTAGE/HYSTEROSCOPY WITH MYOSURE N/A 03/21/2015   Procedure: DILATATION & CURETTAGE/HYSTEROSCOPY;  Surgeon: Aletha Halim, MD;  Location: [facility] ORS;  Service: Gynecology;  Laterality: N/A;  . ENDOMETRIAL ABLATION    . SPINAL FUSION    . TONSILLECTOMY    . TUBAL LIGATION      History reviewed. No pertinent family history.  Social History   Social History  . Marital status: Married    Spouse name: N/A  . Number of children: N/A  . Years  of education: N/A   Occupational History  . Not on file.   Social History Main Topics  . Smoking status: Current Every Day Smoker    Packs/day: 6.00    Years: 10.00    Types: Cigarettes  . Smokeless tobacco: Current User  . Alcohol use No  . Drug use: No  . Sexual activity: Yes   Other Topics Concern  . Not on file   Social History Narrative  . No narrative on file     Current Outpatient Prescriptions:  .  acetaminophen (TYLENOL) 500 MG tablet, Take 1 tablet (500 mg total) by mouth every 6 (six) hours as needed., Disp: 30 tablet, Rfl: 2 .  amitriptyline (ELAVIL) 10 MG tablet, Take 1 tablet (10 mg total) by mouth at bedtime., Disp: 30 tablet, Rfl: 5 .  EVZIO 0.4 MG/0.4ML SOAJ, , Disp: , Rfl:  .  gabapentin (NEURONTIN) 300 MG capsule, Take 1 capsule by mouth every 12 (twelve) hours., Disp: , Rfl:  .  losartan-hydrochlorothiazide (HYZAAR) 100-25 MG tablet, Take 1 tablet by mouth daily., Disp: 30 tablet, Rfl: 2 .  oxyCODONE (ROXICODONE) 15 MG immediate release tablet, Take 1 tablet by mouth every 4 (four) hours., Disp: , Rfl:  .  PRISTIQ 50 MG 24 hr tablet, Take 1 tablet (50 mg total) by mouth daily., Disp: 30 tablet, Rfl: 5 .  ranitidine (ZANTAC) 300 MG tablet, Take 1 tablet (300 mg total) by mouth daily as needed for heartburn., Disp: 30 tablet, Rfl: 5 .  tiZANidine (ZANAFLEX) 4 MG tablet, Take 1 tablet by mouth every 12 (twelve) hours., Disp: , Rfl:  .  triamcinolone (KENALOG) 0.025 % cream, Apply topically 2 (two) times daily., Disp: 453.6 g, Rfl: 0 .  QUEtiapine (SEROQUEL) 25 MG tablet, Take 1 tablet (25 mg total) by mouth at bedtime. For sleep and mood - stop Abilify, Disp: 30 tablet, Rfl: 2  No Known Allergies   ROS  Constitutional: Negative for fever or significant weight change.  Respiratory: Negative for cough and shortness of breath.   Cardiovascular: Negative for chest pain or palpitations.  Gastrointestinal: Negative for abdominal pain, no bowel changes.   Musculoskeletal: Positive  for gait problem secondary to knee pain  or joint swelling.  Skin: Negative for rash.  Neurological: Negative for dizziness or headache.  No other specific complaints in a complete review of systems (except as listed in HPI above).  Objective  Vitals:   08/26/16 0913  BP: 118/64  Pulse: 84  Resp: 18  Temp: 98.2 F (36.8 C)  TempSrc: Oral  SpO2: 97%  Weight: 209 lb 14.4 oz (95.2 kg)  Height: 5\' 9"  (1.753 m)    Body mass index is 31 kg/m.  Physical Exam  Constitutional: Patient appears well-developed and well-nourished. Obese No distress.  HEENT: head atraumatic, normocephalic, pupils equal and reactive to light,  neck supple, throat within normal limits Cardiovascular: Normal rate, regular rhythm and normal heart sounds.  No murmur heard. No BLE edema. Pulmonary/Chest: Effort normal and breath sounds normal. No respiratory distress. Abdominal: Soft.  There is no tenderness. Psychiatric: Patient has a normal mood and affect. behavior is normal. Judgment and thought content normal.  PHQ2/9: Depression screen [facility] 2/9 08/26/2016 05/29/2016 02/19/2016 11/15/2015 08/08/2015  Decreased Interest 0 0 0 0 0  Down, Depressed, Hopeless 0 0 0 0 0  PHQ - 2 Score 0 0 0 0 0  Altered sleeping - - - - -  Tired, decreased energy - - - - -  Change in appetite - - - - -  Feeling bad or failure about yourself  - - - - -  Trouble concentrating - - - - -  Moving slowly or fidgety/restless - - - - -  Suicidal thoughts - - - - -  PHQ-9 Score - - - - -  Difficult doing work/chores - - - - -     Fall Risk: Fall Risk  08/26/2016 05/29/2016 02/19/2016 11/15/2015 08/08/2015  Falls in the past year? Yes No No No No  Number falls in past yr: 1 - - - -  Injury with Fall? No - - - -     Functional Status Survey: Is the patient deaf or have difficulty hearing?: No Does the patient have difficulty seeing, even when wearing glasses/contacts?: No Does the patient have  difficulty concentrating, remembering, or making decisions?: No Does the patient have difficulty walking or climbing stairs?: No Does the patient have difficulty dressing or bathing?: No    Assessment & Plan    1. Hypertension, benign  Dc Norvasc and monitor   2. Needs flu shot  - Flu Vaccine QUAD 36+ mos PF IM (Fluarix & Fluzone Quad PF)  3. COPD, mild ([place])  Advised her again to quit smoking  4. Chronic insomnia  Change from Alprazolam and Abilify to Seroquel  - QUEtiapine (SEROQUEL) 25 MG tablet; Take 1 tablet (25 mg total) by mouth at bedtime. For sleep and mood - stop Abilify  Dispense: 30 tablet; Refill: 2  5. Metabolic syndrome  Discussed life style modification, continue weight loss  6. Major depression, chronic  - QUEtiapine (SEROQUEL) 25 MG tablet; Take 1 tablet (25 mg total) by mouth at bedtime. For sleep and mood - stop Abilify  Dispense: 30 tablet; Refill: 2  7. Chronic low back pain with bilateral sciatica, unspecified back pain laterality  Continue follow up with pain clinic  8. Right hand pain  - Ambulatory referral to Orthopedic Surgery  9. Chronic pain of both knees  - Ambulatory referral to Orthopedic Surgery

## 2016-08-27 ENCOUNTER — Other Ambulatory Visit: Payer: Self-pay | Admitting: Family Medicine

## 2016-08-27 DIAGNOSIS — I10 Essential (primary) hypertension: Secondary | ICD-10-CM

## 2016-08-27 NOTE — Telephone Encounter (Signed)
Patient requesting refill of Losartan-HCTZ.

## 2016-09-05 DIAGNOSIS — M25552 Pain in left hip: Secondary | ICD-10-CM | POA: Diagnosis not present

## 2016-09-05 DIAGNOSIS — M542 Cervicalgia: Secondary | ICD-10-CM | POA: Diagnosis not present

## 2016-09-05 DIAGNOSIS — M25512 Pain in left shoulder: Secondary | ICD-10-CM | POA: Diagnosis not present

## 2016-09-05 DIAGNOSIS — M79662 Pain in left lower leg: Secondary | ICD-10-CM | POA: Diagnosis not present

## 2016-09-05 DIAGNOSIS — M25551 Pain in right hip: Secondary | ICD-10-CM | POA: Diagnosis not present

## 2016-09-05 DIAGNOSIS — Z79891 Long term (current) use of opiate analgesic: Secondary | ICD-10-CM | POA: Diagnosis not present

## 2016-09-05 DIAGNOSIS — M25511 Pain in right shoulder: Secondary | ICD-10-CM | POA: Diagnosis not present

## 2016-09-05 DIAGNOSIS — G894 Chronic pain syndrome: Secondary | ICD-10-CM | POA: Diagnosis not present

## 2016-09-05 DIAGNOSIS — M545 Low back pain: Secondary | ICD-10-CM | POA: Diagnosis not present

## 2016-10-01 ENCOUNTER — Ambulatory Visit
Admission: RE | Admit: 2016-10-01 | Discharge: 2016-10-01 | Disposition: A | Discharge status: Home / Self Care | Payer: Medicare Other | Source: Ambulatory Visit | Attending: Family Medicine | Admitting: Family Medicine

## 2016-10-01 DIAGNOSIS — Z1231 Encounter for screening mammogram for malignant neoplasm of breast: Secondary | ICD-10-CM | POA: Insufficient documentation

## 2016-10-05 DIAGNOSIS — M79604 Pain in right leg: Secondary | ICD-10-CM | POA: Diagnosis not present

## 2016-10-05 DIAGNOSIS — M79661 Pain in right lower leg: Secondary | ICD-10-CM | POA: Diagnosis not present

## 2016-10-05 DIAGNOSIS — Z79891 Long term (current) use of opiate analgesic: Secondary | ICD-10-CM | POA: Diagnosis not present

## 2016-10-05 DIAGNOSIS — M5417 Radiculopathy, lumbosacral region: Secondary | ICD-10-CM | POA: Diagnosis not present

## 2016-10-05 DIAGNOSIS — M25552 Pain in left hip: Secondary | ICD-10-CM | POA: Diagnosis not present

## 2016-10-05 DIAGNOSIS — M79662 Pain in left lower leg: Secondary | ICD-10-CM | POA: Diagnosis not present

## 2016-10-05 DIAGNOSIS — G894 Chronic pain syndrome: Secondary | ICD-10-CM | POA: Diagnosis not present

## 2016-10-05 DIAGNOSIS — M79605 Pain in left leg: Secondary | ICD-10-CM | POA: Diagnosis not present

## 2016-10-05 DIAGNOSIS — M25551 Pain in right hip: Secondary | ICD-10-CM | POA: Diagnosis not present

## 2016-10-16 ENCOUNTER — Other Ambulatory Visit: Payer: Self-pay | Admitting: Family Medicine

## 2016-10-16 DIAGNOSIS — K589 Irritable bowel syndrome without diarrhea: Secondary | ICD-10-CM

## 2016-10-22 DIAGNOSIS — G4730 Sleep apnea, unspecified: Secondary | ICD-10-CM | POA: Diagnosis not present

## 2016-10-30 ENCOUNTER — Other Ambulatory Visit: Payer: Self-pay | Admitting: Family Medicine

## 2016-10-30 DIAGNOSIS — I10 Essential (primary) hypertension: Secondary | ICD-10-CM

## 2016-10-30 DIAGNOSIS — F5104 Psychophysiologic insomnia: Secondary | ICD-10-CM

## 2016-10-30 DIAGNOSIS — F329 Major depressive disorder, single episode, unspecified: Secondary | ICD-10-CM

## 2016-10-30 NOTE — Telephone Encounter (Signed)
Patient requesting refill of Hyzaar and Seroquel to [facility].

## 2016-11-04 DIAGNOSIS — G894 Chronic pain syndrome: Secondary | ICD-10-CM | POA: Diagnosis not present

## 2016-11-04 DIAGNOSIS — G890 Central pain syndrome: Secondary | ICD-10-CM | POA: Diagnosis not present

## 2016-11-04 DIAGNOSIS — G541 Lumbosacral plexus disorders: Secondary | ICD-10-CM | POA: Diagnosis not present

## 2016-11-04 DIAGNOSIS — G4733 Obstructive sleep apnea (adult) (pediatric): Secondary | ICD-10-CM | POA: Diagnosis not present

## 2016-11-04 DIAGNOSIS — M79605 Pain in left leg: Secondary | ICD-10-CM | POA: Diagnosis not present

## 2016-11-04 DIAGNOSIS — M5417 Radiculopathy, lumbosacral region: Secondary | ICD-10-CM | POA: Diagnosis not present

## 2016-11-04 DIAGNOSIS — M79662 Pain in left lower leg: Secondary | ICD-10-CM | POA: Diagnosis not present

## 2016-11-04 DIAGNOSIS — Z79891 Long term (current) use of opiate analgesic: Secondary | ICD-10-CM | POA: Diagnosis not present

## 2016-11-04 DIAGNOSIS — M79604 Pain in right leg: Secondary | ICD-10-CM | POA: Diagnosis not present

## 2016-11-04 DIAGNOSIS — G603 Idiopathic progressive neuropathy: Secondary | ICD-10-CM | POA: Diagnosis not present

## 2016-11-04 DIAGNOSIS — M25551 Pain in right hip: Secondary | ICD-10-CM | POA: Diagnosis not present

## 2016-11-04 DIAGNOSIS — M79661 Pain in right lower leg: Secondary | ICD-10-CM | POA: Diagnosis not present

## 2016-11-04 DIAGNOSIS — M25552 Pain in left hip: Secondary | ICD-10-CM | POA: Diagnosis not present

## 2016-11-25 DIAGNOSIS — M25562 Pain in left knee: Secondary | ICD-10-CM | POA: Diagnosis not present

## 2016-11-25 DIAGNOSIS — M25561 Pain in right knee: Secondary | ICD-10-CM | POA: Diagnosis not present

## 2016-11-25 DIAGNOSIS — M170 Bilateral primary osteoarthritis of knee: Secondary | ICD-10-CM | POA: Diagnosis not present

## 2016-11-27 ENCOUNTER — Ambulatory Visit (INDEPENDENT_AMBULATORY_CARE_PROVIDER_SITE_OTHER): Payer: Medicare Other | Admitting: Family Medicine

## 2016-11-27 ENCOUNTER — Encounter: Payer: Self-pay | Admitting: Family Medicine

## 2016-11-27 VITALS — BP 122/84 | HR 95 | Temp 97.8°F | Resp 16 | Ht 69.0 in | Wt 204.7 lb

## 2016-11-27 DIAGNOSIS — I10 Essential (primary) hypertension: Secondary | ICD-10-CM | POA: Diagnosis not present

## 2016-11-27 DIAGNOSIS — F5104 Psychophysiologic insomnia: Secondary | ICD-10-CM

## 2016-11-27 DIAGNOSIS — F329 Major depressive disorder, single episode, unspecified: Secondary | ICD-10-CM

## 2016-11-27 DIAGNOSIS — M170 Bilateral primary osteoarthritis of knee: Secondary | ICD-10-CM | POA: Diagnosis not present

## 2016-11-27 DIAGNOSIS — R748 Abnormal levels of other serum enzymes: Secondary | ICD-10-CM

## 2016-11-27 DIAGNOSIS — M5441 Lumbago with sciatica, right side: Secondary | ICD-10-CM

## 2016-11-27 DIAGNOSIS — M5442 Lumbago with sciatica, left side: Secondary | ICD-10-CM

## 2016-11-27 DIAGNOSIS — E8881 Metabolic syndrome: Secondary | ICD-10-CM

## 2016-11-27 DIAGNOSIS — G8929 Other chronic pain: Secondary | ICD-10-CM

## 2016-11-27 DIAGNOSIS — K219 Gastro-esophageal reflux disease without esophagitis: Secondary | ICD-10-CM | POA: Diagnosis not present

## 2016-11-27 DIAGNOSIS — R739 Hyperglycemia, unspecified: Secondary | ICD-10-CM

## 2016-11-27 DIAGNOSIS — K582 Mixed irritable bowel syndrome: Secondary | ICD-10-CM

## 2016-11-27 DIAGNOSIS — L308 Other specified dermatitis: Secondary | ICD-10-CM

## 2016-11-27 DIAGNOSIS — J449 Chronic obstructive pulmonary disease, unspecified: Secondary | ICD-10-CM

## 2016-11-27 LAB — CBC WITH DIFFERENTIAL/PLATELET
Basophils Absolute: 0 cells/uL (ref 0–200)
Basophils Relative: 0 %
Eosinophils Absolute: 81 cells/uL (ref 15–500)
Eosinophils Relative: 1 %
HCT: 41.5 % (ref 35.0–45.0)
HEMOGLOBIN: 13.6 g/dL (ref 11.7–15.5)
LYMPHS ABS: 2592 {cells}/uL (ref 850–3900)
Lymphocytes Relative: 32 %
MCH: 30.7 pg (ref 27.0–33.0)
MCHC: 32.8 g/dL (ref 32.0–36.0)
MCV: 93.7 fL (ref 80.0–100.0)
MPV: 11 fL (ref 7.5–12.5)
Monocytes Absolute: 567 cells/uL (ref 200–950)
Monocytes Relative: 7 %
NEUTROS ABS: 4860 {cells}/uL (ref 1500–7800)
NEUTROS PCT: 60 %
PLATELETS: 302 10*3/uL (ref 140–400)
RBC: 4.43 MIL/uL (ref 3.80–5.10)
RDW: 13.3 % (ref 11.0–15.0)
WBC: 8.1 10*3/uL (ref 3.8–10.8)

## 2016-11-27 MED ORDER — AMITRIPTYLINE HCL 10 MG PO TABS: 10.0000 mg | ORAL_TABLET | Freq: Every day | ORAL | 5 refills | Status: DC

## 2016-11-27 MED ORDER — PRISTIQ 50 MG PO TB24: 50.0000 mg | ORAL_TABLET | Freq: Every day | ORAL | 5 refills | Status: DC

## 2016-11-27 MED ORDER — LOSARTAN POTASSIUM-HCTZ 100-25 MG PO TABS: 1.0000 | ORAL_TABLET | Freq: Every day | ORAL | 5 refills | Status: DC

## 2016-11-27 MED ORDER — QUETIAPINE FUMARATE 25 MG PO TABS: ORAL_TABLET | ORAL | 5 refills | Status: DC

## 2016-11-27 NOTE — Progress Notes (Signed)
Name: [name]   MRN: [id number]    DOB: 1961/03/17   Date:11/27/2016       Progress Note  Subjective  Chief Complaint  Chief Complaint  Patient presents with  . Medication Refill    3 month F/U  . Hypertension    Denies any symptoms  . Insomnia  . Gastroesophageal Reflux    Controlled with medication as needed and watching what she eats- no spicy foods nor orange juice  . Knee Pain    Seen Orthopedics and was prescribed Meloxicam, has helped symptoms    HPI  HTN: taking Losartan hctz, she states edema has resolved once we stopped norvasc,  no chest pain, SOB or palpitation, compliant with medication.   GERD: well controlled, only taking Ranitidine prn, symptoms are now very sporadic and described as burning sensation, usually after she eats something greasy, she is avoiding triggers   Insomnia: currently off alprazolam and doing well on Seroquel qhs, she is able to fall asleep but still wakes up during the night secondary to pain on lateral hips. She is turning on the TV, discussed taking Tylenol and keep TV off.   Depression Major: doing much better, compliant with medication. No crying spells recently. Still has blue days, but not daily, in partial remission, does not want to change medication or see psychiatrist at this time  COPD: she is still smoking and she is not ready to quit at this time, no cough, no wheezing or SOB, she stopped using Spirva and Advair.   Eczema: currently no rashes, symptoms usually present in the Spring and Summer. She is using medication prn with good control  Chronic back pain: she goes to [facility] in [place] and the pain has been stable at around 6/10 but can get higher, no side effects of medication. She states pain bothers her at night  OA both knees : going on for the past year, knees  grinds when walking. No instability. Pain is described as aching and it can be 4/10 in intensity, better with rest and worse with  activity. Seen by Dr. Rudene Christians this week and was given Meloxicam, doing better today, discussed possible side effects of medication  Obesity: she has lost 5 lbs since last visit,  She has decrease portion size, still not exercising.   IBS: taking Elavil at night and seems to be controlled GI pain, episodes of diarrhea and constipation are stable  Metabolic syndrome: she denies polyphagia, polydipsia or polyuria  Low HDL: we will recheck labs today   Patient Active Problem List   Diagnosis Date Noted  . Osteoarthritis, knee 02/19/2016  . COPD, mild ([place]) 05/08/2015  . Vitamin D deficiency 05/07/2015  . Metabolic syndrome 123XX123  . Eczema 05/07/2015  . Chronic insomnia 05/07/2015  . Hypertension, benign 05/07/2015  . Dyslipidemia 05/07/2015  . Hyperglycemia 05/07/2015  . Chronic low back pain 05/07/2015  . IBS (irritable bowel syndrome) 05/07/2015  . Gastroesophageal reflux disease without esophagitis 05/07/2015  . Major depression, chronic ([place]) 05/07/2015  . Menopausal syndrome (hot flashes) 05/07/2015  . History of postmenopausal bleeding 01/14/2015    Past Surgical History:  Procedure Laterality Date  . BREAST BIOPSY Left    neg  . BREAST LUMPECTOMY    . CERVICAL DISCECTOMY     x 2  . CHOLECYSTECTOMY    . DILATATION & CURETTAGE/HYSTEROSCOPY WITH MYOSURE N/A 03/21/2015   Procedure: DILATATION & CURETTAGE/HYSTEROSCOPY;  Surgeon: Aletha Halim, MD;  Location: [facility] ORS;  Service: Gynecology;  Laterality: N/A;  . ENDOMETRIAL ABLATION    . SPINAL FUSION    . TONSILLECTOMY    . TUBAL LIGATION      Family History  Problem Relation Age of Onset  . Heart disease Mother     Social History   Social History  . Marital status: Married    Spouse name: N/A  . Number of children: N/A  . Years of education: N/A   Occupational History  . Not on file.   Social History Main Topics  . Smoking status: Current Every Day Smoker    Packs/day: 0.25    Years: 15.00    Types:  Cigarettes  . Smokeless tobacco: Current User  . Alcohol use No  . Drug use: No  . Sexual activity: Yes   Other Topics Concern  . Not on file   Social History Narrative  . No narrative on file     Current Outpatient Prescriptions:  .  acetaminophen (TYLENOL) 500 MG tablet, Take 1 tablet (500 mg total) by mouth every 6 (six) hours as needed., Disp: 30 tablet, Rfl: 2 .  amitriptyline (ELAVIL) 10 MG tablet, Take 1 tablet (10 mg total) by mouth at bedtime., Disp: 30 tablet, Rfl: 5 .  EVZIO 0.4 MG/0.4ML SOAJ, , Disp: , Rfl:  .  gabapentin (NEURONTIN) 300 MG capsule, Take 1 capsule by mouth every 12 (twelve) hours., Disp: , Rfl:  .  losartan-hydrochlorothiazide (HYZAAR) 100-25 MG tablet, Take 1 tablet by mouth daily., Disp: 30 tablet, Rfl: 5 .  meloxicam (MOBIC) 7.5 MG tablet, Take 1 tablet by mouth daily., Disp: , Rfl:  .  oxyCODONE (ROXICODONE) 15 MG immediate release tablet, Take 1 tablet by mouth every 4 (four) hours., Disp: , Rfl:  .  PRISTIQ 50 MG 24 hr tablet, Take 1 tablet (50 mg total) by mouth daily., Disp: 30 tablet, Rfl: 5 .  QUEtiapine (SEROQUEL) 25 MG tablet, TAKE ONE TABLET BY MOUTH AT BEDTIME FOR SLEEP., Disp: 30 tablet, Rfl: 5 .  ranitidine (ZANTAC) 300 MG tablet, Take 1 tablet (300 mg total) by mouth daily as needed for heartburn., Disp: 30 tablet, Rfl: 5 .  tiZANidine (ZANAFLEX) 4 MG tablet, Take 1 tablet by mouth every 12 (twelve) hours., Disp: , Rfl:  .  triamcinolone (KENALOG) 0.025 % cream, Apply topically 2 (two) times daily., Disp: 453.6 g, Rfl: 0  No Known Allergies   ROS  Constitutional: Negative for fever , mild weight change.  Respiratory: Negative for cough and shortness of breath.   Cardiovascular: Negative for chest pain or palpitations.  Gastrointestinal: Negative for abdominal pain, no bowel changes.  Musculoskeletal: Negative for gait problem or joint swelling.  Skin: Negative for rash.  Neurological: Negative for dizziness or headache.  No other  specific complaints in a complete review of systems (except as listed in HPI above).  Objective  Vitals:   11/27/16 0931  BP: 122/84  Pulse: 95  Resp: 16  Temp: 97.8 F (36.6 C)  TempSrc: Oral  SpO2: 95%  Weight: 204 lb 11.2 oz (92.9 kg)  Height: 5\' 9"  (1.753 m)    Body mass index is 30.23 kg/m.  Physical Exam  Constitutional: Patient appears well-developed and well-nourished. Obese No distress.  HEENT: head atraumatic, normocephalic, pupils equal and reactive to light,neck supple, throat within normal limits Cardiovascular: Normal rate, regular rhythm and normal heart sounds.  No murmur heard. No BLE edema. Pulmonary/Chest: Effort normal and breath sounds normal. No respiratory distress. Abdominal: Soft.  There is no  tenderness. Psychiatric: Patient has a normal mood and affect. behavior is normal. Judgment and thought content normal. Muscular Skeletal: pain during palpation of lumbar spine, crepitus with extension of both knees  PHQ2/9: Depression screen [facility] 2/9 11/27/2016 08/26/2016 05/29/2016 02/19/2016 11/15/2015  Decreased Interest 0 0 0 0 0  Down, Depressed, Hopeless 0 0 0 0 0  PHQ - 2 Score 0 0 0 0 0  Altered sleeping - - - - -  Tired, decreased energy - - - - -  Change in appetite - - - - -  Feeling bad or failure about yourself  - - - - -  Trouble concentrating - - - - -  Moving slowly or fidgety/restless - - - - -  Suicidal thoughts - - - - -  PHQ-9 Score - - - - -  Difficult doing work/chores - - - - -     Fall Risk: Fall Risk  11/27/2016 08/26/2016 05/29/2016 02/19/2016 11/15/2015  Falls in the past year? No Yes No No No  Number falls in past yr: - 1 - - -  Injury with Fall? - No - - -      Functional Status Survey: Is the patient deaf or have difficulty hearing?: No Does the patient have difficulty seeing, even when wearing glasses/contacts?: No Does the patient have difficulty concentrating, remembering, or making decisions?: No Does the patient have  difficulty walking or climbing stairs?: No Does the patient have difficulty dressing or bathing?: No Does the patient have difficulty doing errands alone such as visiting a doctor's office or shopping?: No    Assessment & Plan  1. Hypertension, benign  - losartan-hydrochlorothiazide (HYZAAR) 100-25 MG tablet; Take 1 tablet by mouth daily.  Dispense: 30 tablet; Refill: 5 - COMPLETE METABOLIC PANEL WITH GFR - CBC with Differential/Platelet  2. COPD, mild ([place])  Still smoking, but denies SOB or cough, advised her to quit smoking again   3. Chronic insomnia  - QUEtiapine (SEROQUEL) 25 MG tablet; TAKE ONE TABLET BY MOUTH AT BEDTIME FOR SLEEP.  Dispense: 30 tablet; Refill: 5  4. Metabolic syndrome  - Hemoglobin A1c - Insulin, fasting  5. Chronic midline low back pain with bilateral sciatica  Continue follow up with pain clinic. Dr. Hilary Hertz   6. Major depression, chronic  - QUEtiapine (SEROQUEL) 25 MG tablet; TAKE ONE TABLET BY MOUTH AT BEDTIME FOR SLEEP.  Dispense: 30 tablet; Refill: 5 - PRISTIQ 50 MG 24 hr tablet; Take 1 tablet (50 mg total) by mouth daily.  Dispense: 30 tablet; Refill: 5  7. Irritable bowel syndrome with both constipation and diarrhea  - amitriptyline (ELAVIL) 10 MG tablet; Take 1 tablet (10 mg total) by mouth at bedtime.  Dispense: 30 tablet; Refill: 5  8. Low serum HDL  - Lipid panel  9. Hyperglycemia  - Hemoglobin A1c  10. Primary osteoarthritis of both knees   11. Other eczema   12. Gastroesophageal reflux disease without esophagitis

## 2016-11-28 LAB — COMPLETE METABOLIC PANEL WITH GFR
ALT: 24 U/L (ref 6–29)
AST: 20 U/L (ref 10–35)
Albumin: 4.5 g/dL (ref 3.6–5.1)
Alkaline Phosphatase: 82 U/L (ref 33–130)
BUN: 10 mg/dL (ref 7–25)
CHLORIDE: 104 mmol/L (ref 98–110)
CO2: 29 mmol/L (ref 20–31)
Calcium: 9.7 mg/dL (ref 8.6–10.4)
Creat: 0.64 mg/dL (ref 0.50–1.05)
GFR, Est African American: >89 mL/min (ref >=60)
GLUCOSE: 91 mg/dL (ref 65–99)
POTASSIUM: 4.5 mmol/L (ref 3.5–5.3)
SODIUM: 141 mmol/L (ref 135–146)
Total Bilirubin: 0.3 mg/dL (ref 0.2–1.2)
Total Protein: 7.2 g/dL (ref 6.1–8.1)

## 2016-11-28 LAB — LIPID PANEL
CHOL/HDL RATIO: 3.1 ratio (ref <5.0)
Cholesterol: 151 mg/dL (ref <200)
HDL: 49 mg/dL — AB (ref >50)
LDL CALC: 85 mg/dL (ref <100)
Triglycerides: 84 mg/dL (ref <150)
VLDL: 17 mg/dL (ref <30)

## 2016-11-28 LAB — INSULIN, FASTING: Insulin fasting, serum: 17.5 u[IU]/mL (ref 2.0–19.6)

## 2016-11-28 LAB — HEMOGLOBIN A1C
Hgb A1c MFr Bld: 5.7 % — ABNORMAL HIGH (ref <5.7)
Mean Plasma Glucose: 117 mg/dL

## 2016-12-04 DIAGNOSIS — M542 Cervicalgia: Secondary | ICD-10-CM | POA: Diagnosis not present

## 2016-12-04 DIAGNOSIS — G894 Chronic pain syndrome: Secondary | ICD-10-CM | POA: Diagnosis not present

## 2016-12-04 DIAGNOSIS — Z79891 Long term (current) use of opiate analgesic: Secondary | ICD-10-CM | POA: Diagnosis not present

## 2016-12-04 DIAGNOSIS — M25552 Pain in left hip: Secondary | ICD-10-CM | POA: Diagnosis not present

## 2016-12-04 DIAGNOSIS — M25511 Pain in right shoulder: Secondary | ICD-10-CM | POA: Diagnosis not present

## 2016-12-04 DIAGNOSIS — M545 Low back pain: Secondary | ICD-10-CM | POA: Diagnosis not present

## 2016-12-04 DIAGNOSIS — M25551 Pain in right hip: Secondary | ICD-10-CM | POA: Diagnosis not present

## 2016-12-04 DIAGNOSIS — M25512 Pain in left shoulder: Secondary | ICD-10-CM | POA: Diagnosis not present

## 2016-12-04 DIAGNOSIS — M79662 Pain in left lower leg: Secondary | ICD-10-CM | POA: Diagnosis not present

## 2017-01-05 DIAGNOSIS — M25511 Pain in right shoulder: Secondary | ICD-10-CM | POA: Diagnosis not present

## 2017-01-05 DIAGNOSIS — M545 Low back pain: Secondary | ICD-10-CM | POA: Diagnosis not present

## 2017-01-05 DIAGNOSIS — G894 Chronic pain syndrome: Secondary | ICD-10-CM | POA: Diagnosis not present

## 2017-01-05 DIAGNOSIS — M25551 Pain in right hip: Secondary | ICD-10-CM | POA: Diagnosis not present

## 2017-01-05 DIAGNOSIS — Z79891 Long term (current) use of opiate analgesic: Secondary | ICD-10-CM | POA: Diagnosis not present

## 2017-01-05 DIAGNOSIS — M25552 Pain in left hip: Secondary | ICD-10-CM | POA: Diagnosis not present

## 2017-01-05 DIAGNOSIS — M25512 Pain in left shoulder: Secondary | ICD-10-CM | POA: Diagnosis not present

## 2017-01-05 DIAGNOSIS — M79662 Pain in left lower leg: Secondary | ICD-10-CM | POA: Diagnosis not present

## 2017-02-02 DIAGNOSIS — G894 Chronic pain syndrome: Secondary | ICD-10-CM | POA: Diagnosis not present

## 2017-02-02 DIAGNOSIS — Z79891 Long term (current) use of opiate analgesic: Secondary | ICD-10-CM | POA: Diagnosis not present

## 2017-02-02 DIAGNOSIS — M25511 Pain in right shoulder: Secondary | ICD-10-CM | POA: Diagnosis not present

## 2017-02-02 DIAGNOSIS — M545 Low back pain: Secondary | ICD-10-CM | POA: Diagnosis not present

## 2017-02-02 DIAGNOSIS — M25551 Pain in right hip: Secondary | ICD-10-CM | POA: Diagnosis not present

## 2017-02-02 DIAGNOSIS — M25512 Pain in left shoulder: Secondary | ICD-10-CM | POA: Diagnosis not present

## 2017-02-02 DIAGNOSIS — M25552 Pain in left hip: Secondary | ICD-10-CM | POA: Diagnosis not present

## 2017-02-02 DIAGNOSIS — M79662 Pain in left lower leg: Secondary | ICD-10-CM | POA: Diagnosis not present

## 2017-03-04 DIAGNOSIS — Z79891 Long term (current) use of opiate analgesic: Secondary | ICD-10-CM | POA: Diagnosis not present

## 2017-03-04 DIAGNOSIS — G4733 Obstructive sleep apnea (adult) (pediatric): Secondary | ICD-10-CM | POA: Diagnosis not present

## 2017-03-04 DIAGNOSIS — M5417 Radiculopathy, lumbosacral region: Secondary | ICD-10-CM | POA: Diagnosis not present

## 2017-03-04 DIAGNOSIS — M79605 Pain in left leg: Secondary | ICD-10-CM | POA: Diagnosis not present

## 2017-03-04 DIAGNOSIS — G894 Chronic pain syndrome: Secondary | ICD-10-CM | POA: Diagnosis not present

## 2017-03-04 DIAGNOSIS — M79604 Pain in right leg: Secondary | ICD-10-CM | POA: Diagnosis not present

## 2017-03-08 ENCOUNTER — Emergency Department
Admission: EM | Admit: 2017-03-08 | Discharge: 2017-03-08 | Disposition: A | Discharge status: Home / Self Care | Payer: Medicare Other | Attending: Emergency Medicine | Admitting: Emergency Medicine

## 2017-03-08 DIAGNOSIS — F17210 Nicotine dependence, cigarettes, uncomplicated: Secondary | ICD-10-CM | POA: Insufficient documentation

## 2017-03-08 DIAGNOSIS — Z79899 Other long term (current) drug therapy: Secondary | ICD-10-CM | POA: Diagnosis not present

## 2017-03-08 DIAGNOSIS — M79642 Pain in left hand: Secondary | ICD-10-CM | POA: Insufficient documentation

## 2017-03-08 DIAGNOSIS — I10 Essential (primary) hypertension: Secondary | ICD-10-CM | POA: Insufficient documentation

## 2017-03-08 DIAGNOSIS — M79641 Pain in right hand: Secondary | ICD-10-CM

## 2017-03-08 DIAGNOSIS — R202 Paresthesia of skin: Secondary | ICD-10-CM | POA: Diagnosis not present

## 2017-03-08 DIAGNOSIS — J449 Chronic obstructive pulmonary disease, unspecified: Secondary | ICD-10-CM | POA: Insufficient documentation

## 2017-03-08 LAB — COMPREHENSIVE METABOLIC PANEL
ALBUMIN: 3.7 g/dL (ref 3.5–5.0)
ALK PHOS: 71 U/L (ref 38–126)
ALT: 30 U/L (ref 14–54)
ANION GAP: 8 (ref 5–15)
AST: 34 U/L (ref 15–41)
BUN: 13 mg/dL (ref 6–20)
CALCIUM: 9.3 mg/dL (ref 8.9–10.3)
CO2: 25 mmol/L (ref 22–32)
Chloride: 104 mmol/L (ref 101–111)
Creatinine, Ser: 0.5 mg/dL (ref 0.44–1.00)
GFR calc Af Amer: >60 mL/min (ref >60)
GFR calc non Af Amer: >60 mL/min (ref >60)
GLUCOSE: 94 mg/dL (ref 65–99)
Potassium: 3.6 mmol/L (ref 3.5–5.1)
Sodium: 137 mmol/L (ref 135–145)
TOTAL PROTEIN: 6.4 g/dL — AB (ref 6.5–8.1)
Total Bilirubin: 0.5 mg/dL (ref 0.3–1.2)

## 2017-03-08 LAB — CBC WITH DIFFERENTIAL/PLATELET
BASOS PCT: 1 %
Basophils Absolute: 0 10*3/uL (ref 0–0.1)
Eosinophils Absolute: 0.1 10*3/uL (ref 0–0.7)
Eosinophils Relative: 2 %
HEMATOCRIT: 35.3 % (ref 35.0–47.0)
HEMOGLOBIN: 11.9 g/dL — AB (ref 12.0–16.0)
LYMPHS ABS: 1.9 10*3/uL (ref 1.0–3.6)
LYMPHS PCT: 24 %
MCH: 31.6 pg (ref 26.0–34.0)
MCHC: 33.8 g/dL (ref 32.0–36.0)
MCV: 93.5 fL (ref 80.0–100.0)
MONOS PCT: 8 %
Monocytes Absolute: 0.6 10*3/uL (ref 0.2–0.9)
NEUTROS ABS: 5.2 10*3/uL (ref 1.4–6.5)
NEUTROS PCT: 65 %
Platelets: 241 10*3/uL (ref 150–440)
RBC: 3.77 MIL/uL — ABNORMAL LOW (ref 3.80–5.20)
RDW: 12.8 % (ref 11.5–14.5)
WBC: 7.8 10*3/uL (ref 3.6–11.0)

## 2017-03-08 MED ORDER — KETOROLAC TROMETHAMINE 30 MG/ML IJ SOLN
30.0000 mg | Freq: Once | INTRAMUSCULAR | Status: AC
  Administered 2017-03-08: 30 mg via INTRAMUSCULAR
  Filled 2017-03-08: qty 1

## 2017-03-08 MED ORDER — ETODOLAC 400 MG PO TABS: 400.0000 mg | ORAL_TABLET | Freq: Two times a day (BID) | ORAL | 0 refills | Status: DC

## 2017-03-08 NOTE — ED Provider Notes (Addendum)
[facility] Emergency Department Provider Note  ____________________________________________   First MD Initiated Contact with Patient 03/08/17 1321     (approximate)  I have reviewed the triage vital signs and the nursing notes.   HISTORY  Chief Complaint Hand Pain    HPI [name] is a 56 y.o. female is here with complaint of bilateral hand pain for last 2 weeks.She denies any injury to her hands. She states that this is becoming more constant recently. She didn't describes it as a burning tingling sensation in her fingers that originates from her wrist. She denies any repetitive motions states that she does not work. She has not seen Dr.Sowles for her hand problems. Patient states that she has been taking Aleve for the last several days but did not take any today. She's also been taking Percocet which has not helped with her pain. Patient states that she has a history of chronic back pain for which she takes the Percocet. She rates her pain in her hands as a 10 over 10.   Past Medical History:  Diagnosis Date  . Contact dermatitis and other eczema, due to unspecified cause   . COPD (chronic obstructive pulmonary disease) ([place])   . Depressive disorder   . Dysmetabolic syndrome X   . Hyperlipidemia   . Hypertension   . IBS (irritable bowel syndrome)   . Insomnia   . Lumbago   . Nonspecific abnormal electrocardiogram (ECG) (EKG)   . Other ovarian failure(256.39)   . Postmenopausal atrophic vaginitis   . Symptomatic menopausal or female climacteric states   . Unspecified vitamin D deficiency     Patient Active Problem List   Diagnosis Date Noted  . Osteoarthritis of both knees 02/19/2016  . COPD, mild ([place]) 05/08/2015  . Vitamin D deficiency 05/07/2015  . Metabolic syndrome 83/38/2505  . Eczema 05/07/2015  . Chronic insomnia 05/07/2015  . Hypertension, benign 05/07/2015  . Dyslipidemia 05/07/2015  . Hyperglycemia 05/07/2015  . Chronic  low back pain 05/07/2015  . IBS (irritable bowel syndrome) 05/07/2015  . Gastroesophageal reflux disease without esophagitis 05/07/2015  . Major depression, chronic ([place]) 05/07/2015  . Menopausal syndrome (hot flashes) 05/07/2015  . History of postmenopausal bleeding 01/14/2015    Past Surgical History:  Procedure Laterality Date  . BREAST BIOPSY Left    neg  . BREAST LUMPECTOMY    . CERVICAL DISCECTOMY     x 2  . CHOLECYSTECTOMY    . DILATATION & CURETTAGE/HYSTEROSCOPY WITH MYOSURE N/A 03/21/2015   Procedure: DILATATION & CURETTAGE/HYSTEROSCOPY;  Surgeon: Aletha Halim, MD;  Location: [facility] ORS;  Service: Gynecology;  Laterality: N/A;  . ENDOMETRIAL ABLATION    . SPINAL FUSION    . TONSILLECTOMY    . TUBAL LIGATION      Prior to Admission medications   Medication Sig Start Date End Date Taking? Authorizing Provider  acetaminophen (TYLENOL) 500 MG tablet Take 1 tablet (500 mg total) by mouth every 6 (six) hours as needed. 02/19/16   Steele Sizer, MD  amitriptyline (ELAVIL) 10 MG tablet Take 1 tablet (10 mg total) by mouth at bedtime. 11/27/16   Steele Sizer, MD  etodolac (LODINE) 400 MG tablet Take 1 tablet (400 mg total) by mouth 2 (two) times daily. 03/08/17   Johnn Hai, PA-C  EVZIO 0.4 MG/0.4ML [facility]  08/05/15   [provider]  gabapentin (NEURONTIN) 300 MG capsule Take 1 capsule by mouth every 12 (twelve) hours. 05/07/15   [provider]  losartan-hydrochlorothiazide (HYZAAR) 100-25 MG tablet Take 1 tablet by mouth daily. 11/27/16   Steele Sizer, MD  oxyCODONE (ROXICODONE) 15 MG immediate release tablet Take 1 tablet by mouth every 4 (four) hours. 05/07/15   [provider]  PRISTIQ 50 MG 24 hr tablet Take 1 tablet (50 mg total) by mouth daily. 11/27/16   Steele Sizer, MD  QUEtiapine (SEROQUEL) 25 MG tablet TAKE ONE TABLET BY MOUTH AT BEDTIME FOR SLEEP. 11/27/16   Steele Sizer, MD  ranitidine (ZANTAC) 300 MG tablet Take 1 tablet (300 mg  total) by mouth daily as needed for heartburn. 09/05/15   Steele Sizer, MD  tiZANidine (ZANAFLEX) 4 MG tablet Take 1 tablet by mouth every 12 (twelve) hours. 05/07/15   [provider]  triamcinolone (KENALOG) 0.025 % cream Apply topically 2 (two) times daily. 05/29/16   Steele Sizer, MD    Allergies Patient has no known allergies.  Family History  Problem Relation Age of Onset  . Heart disease Mother     Social History Social History  Substance Use Topics  . Smoking status: Current Every Day Smoker    Packs/day: 0.25    Years: 15.00    Types: Cigarettes  . Smokeless tobacco: Current User  . Alcohol use No    Review of Systems Constitutional: No fever/chills Cardiovascular: Denies chest pain. Respiratory: Denies shortness of breath. Musculoskeletal: Positive bilateral hand pain. Positive chronic back pain. Skin: Negative for rash. Neurological: Negative for headaches, focal weakness or numbness.   ____________________________________________   PHYSICAL EXAM:  VITAL SIGNS: ED Triage Vitals [03/08/17 1215]  Enc Vitals Group     BP (!) 131/59     Pulse Rate 75     Resp 16     Temp 98 F (36.7 C)     Temp Source Oral     SpO2 99 %     Weight 200 lb (90.7 kg)     Height 5' 8.5" (1.74 m)     Head Circumference      Peak Flow      Pain Score 10     Pain Loc      Pain Edu?      Excl. in [place]?     Constitutional: Alert and oriented. Well appearing and in no acute distress. Eyes: Conjunctivae are normal. PERRL. EOMI. Head: Atraumatic. Nose: No congestion/rhinnorhea. Neck: No stridor.  Cardiovascular: Normal rate, regular rhythm. Grossly normal heart sounds.  Good peripheral circulation. Respiratory: Normal respiratory effort.  No retractions. Lungs CTAB. Musculoskeletal: On examination of bilateral hands there is no gross deformity. Range of motion is minimally restricted. Patient is able to flex and extend digits as well as at the wrist joints  bilaterally. There is no soft tissue swelling present. There is no muscle wasting noted at this time. There was a slightly positive Tinel sign bilaterally. Capillary refill was undetermined since patient has nail polish. Neurologic:  Normal speech and language. No gross focal neurologic deficits are appreciated. No gait instability. Skin:  Skin is warm, dry and intact. No abrasions, erythema or ecchymosis was noted. No warmth to touch. Psychiatric: Mood and affect are normal. Speech and behavior are normal.  ____________________________________________   LABS (all labs ordered are listed, but only abnormal results are displayed)  Labs Reviewed  CBC WITH DIFFERENTIAL/PLATELET - Abnormal; Notable for the following:       Result Value   RBC 3.77 (*)    Hemoglobin 11.9 (*)    All other components within normal limits  COMPREHENSIVE METABOLIC PANEL - Abnormal; Notable for the following:    Total Protein 6.4 (*)    All other components within normal limits    ____________________________________________   PROCEDURES  Procedure(s) performed: None  Procedures  Critical Care performed: No  ____________________________________________   INITIAL IMPRESSION / ASSESSMENT AND PLAN / ED COURSE  Pertinent labs & imaging results that were available during my care of the patient were reviewed by me and considered in my medical decision making (see chart for details).  Lab work was basically within normal limits and patient was made aware of this. She is to discontinue taking meloxicam and Aleve at this time. She was placed on etodolac 400 mg twice a day with food. She'll continue taking Neurontin and her oxycodone. She is encouraged to make an appointment with her primary care doctor to discuss any further workup for what possibly is carpal tunnel syndrome.    ___________________________________________   FINAL CLINICAL IMPRESSION(S) / ED DIAGNOSES  Final diagnoses:  Bilateral hand  pain      NEW MEDICATIONS STARTED DURING THIS VISIT:  New Prescriptions   ETODOLAC (LODINE) 400 MG TABLET    Take 1 tablet (400 mg total) by mouth 2 (two) times daily.     Note:  This document was prepared using Dragon voice recognition software and may include unintentional dictation errors.    Johnn Hai, PA-C 03/08/17 1448    Schuyler Amor, MD 03/08/17 1505    Johnn Hai, PA-C 03/08/17 1506    Johnn Hai, PA-C 03/08/17 1510    Schuyler Amor, MD 03/15/17 [phone]

## 2017-03-08 NOTE — ED Notes (Signed)
Pt ambulatory to ER lobby. Pt alert and oriented X4, active, cooperative, pt in NAD. RR even and unlabored, color WNL.

## 2017-03-08 NOTE — Discharge Instructions (Signed)
Discontinued taking meloxicam and Aleve. Begin taking etodolac 400 mg twice a day with food. Continue your regular medication including your pain medication and Neurontin. Call and make an appointment with Dr. Ancil Boozer for continued workup on your bilateral hand pain.

## 2017-03-08 NOTE — ED Triage Notes (Signed)
Pt c/o bilateral hand tingling and burning X 1.5 weeks. Reports that it is constant. Pt alert and oriented X4, active, cooperative, pt in NAD. RR even and unlabored, color WNL.  Color WNL of hands. Pt did not take any medications PTA.

## 2017-03-09 ENCOUNTER — Encounter: Payer: Self-pay | Admitting: Family Medicine

## 2017-03-09 ENCOUNTER — Ambulatory Visit (INDEPENDENT_AMBULATORY_CARE_PROVIDER_SITE_OTHER): Payer: Medicare Other | Admitting: Family Medicine

## 2017-03-09 VITALS — BP 164/68 | HR 97 | Temp 98.3°F | Resp 16 | Wt 217.1 lb

## 2017-03-09 DIAGNOSIS — F4321 Adjustment disorder with depressed mood: Secondary | ICD-10-CM

## 2017-03-09 DIAGNOSIS — M26621 Arthralgia of right temporomandibular joint: Secondary | ICD-10-CM | POA: Diagnosis not present

## 2017-03-09 DIAGNOSIS — D649 Anemia, unspecified: Secondary | ICD-10-CM

## 2017-03-09 DIAGNOSIS — R202 Paresthesia of skin: Secondary | ICD-10-CM

## 2017-03-09 DIAGNOSIS — M25649 Stiffness of unspecified hand, not elsewhere classified: Secondary | ICD-10-CM

## 2017-03-09 DIAGNOSIS — E8881 Metabolic syndrome: Secondary | ICD-10-CM

## 2017-03-09 DIAGNOSIS — R635 Abnormal weight gain: Secondary | ICD-10-CM | POA: Diagnosis not present

## 2017-03-09 DIAGNOSIS — F4320 Adjustment disorder, unspecified: Secondary | ICD-10-CM

## 2017-03-09 LAB — TSH: TSH: 0.71 m[IU]/L

## 2017-03-09 MED ORDER — METFORMIN HCL 500 MG PO TABS: 500.0000 mg | ORAL_TABLET | Freq: Two times a day (BID) | ORAL | 3 refills | Status: DC

## 2017-03-09 MED ORDER — LIRAGLUTIDE 18 MG/3ML ~~LOC~~ SOPN: 0.6000 mg | PEN_INJECTOR | Freq: Every day | SUBCUTANEOUS | 2 refills | Status: DC

## 2017-03-09 MED ORDER — INSULIN PEN NEEDLE 30G X 8 MM MISC: 1.0000 | 2 refills | Status: DC | PRN

## 2017-03-09 NOTE — Progress Notes (Signed)
Name: [name]   MRN: [id number]    DOB: May 20, 1961   Date:03/09/2017       Progress Note  Subjective  Chief Complaint  Chief Complaint  Patient presents with  . Hospitalization Follow-up    numbness in hands ER visit last night symptoms started about 2 weeks ago              HPI  Hand pain and paresthesia: she states that over the past 2 weeks she noticed increase in stiffness and numbness on both hands. She also has a burning sensation that is intermittent. She states symptoms are worse at night, she states that shaking her hands seems to help with symptoms. She has noticed decrease in rom of wrist, and sometimes hands feels warm, but not red. She went to [facility] yesterday because pain was so intense. She was given Etodolac rx but never filled it.   TMJ: she has intermittent pain on right jaw, worse when chewing, she states when she stopped chewing gum it decrease the symptoms. Pain is aching and at times sharp.   Metabolic syndrome and also weight gain: she has gained 13 lbs in the past 3 months, she denies polyphagia, polydipsia or polyuria. Discussed GLP-1 agonist, but she states her mother has a history of thyroid cancer  HTN: her bp is usually at goal, but is elevated today, she states she is in pain, we will recheck before she leaves our office  Patient Active Problem List   Diagnosis Date Noted  . Osteoarthritis of both knees 02/19/2016  . COPD, mild ([place]) 05/08/2015  . Vitamin D deficiency 05/07/2015  . Metabolic syndrome 95/63/8756  . Eczema 05/07/2015  . Chronic insomnia 05/07/2015  . Hypertension, benign 05/07/2015  . Dyslipidemia 05/07/2015  . Hyperglycemia 05/07/2015  . Chronic low back pain 05/07/2015  . IBS (irritable bowel syndrome) 05/07/2015  . Gastroesophageal reflux disease without esophagitis 05/07/2015  . Major depression, chronic ([place]) 05/07/2015  . Menopausal syndrome (hot flashes) 05/07/2015  . History of postmenopausal bleeding 01/14/2015    Past  Surgical History:  Procedure Laterality Date  . BREAST BIOPSY Left    neg  . BREAST LUMPECTOMY    . CERVICAL DISCECTOMY     x 2  . CHOLECYSTECTOMY    . DILATATION & CURETTAGE/HYSTEROSCOPY WITH MYOSURE N/A 03/21/2015   Procedure: DILATATION & CURETTAGE/HYSTEROSCOPY;  Surgeon: Aletha Halim, MD;  Location: [facility] ORS;  Service: Gynecology;  Laterality: N/A;  . ENDOMETRIAL ABLATION    . SPINAL FUSION    . TONSILLECTOMY    . TUBAL LIGATION      Family History  Problem Relation Age of Onset  . Heart disease Mother   . Depression Daughter     Social History   Social History  . Marital status: Married    Spouse name: N/A  . Number of children: N/A  . Years of education: N/A   Occupational History  . Not on file.   Social History Main Topics  . Smoking status: Current Every Day Smoker    Packs/day: 0.25    Years: 15.00    Types: Cigarettes  . Smokeless tobacco: Current User  . Alcohol use No  . Drug use: No  . Sexual activity: Yes   Other Topics Concern  . Not on file   Social History Narrative  . No narrative on file     Current Outpatient Prescriptions:  .  meloxicam (MOBIC) 7.5 MG tablet, Take by mouth., Disp: , Rfl:  .  acetaminophen (TYLENOL) 500 MG tablet, Take 1 tablet (500 mg total) by mouth every 6 (six) hours as needed., Disp: 30 tablet, Rfl: 2 .  amitriptyline (ELAVIL) 10 MG tablet, Take 1 tablet (10 mg total) by mouth at bedtime., Disp: 30 tablet, Rfl: 5 .  etodolac (LODINE) 400 MG tablet, Take 1 tablet (400 mg total) by mouth 2 (two) times daily., Disp: 20 tablet, Rfl: 0 .  EVZIO 0.4 MG/0.4ML SO[facility], , Disp: , Rfl:  .  gabapentin (NEURONTIN) 300 MG capsule, Take 1 capsule by mouth every 12 (twelve) hours., Disp: , Rfl:  .  Insulin Pen Needle (NOVOFINE) 30G X 8 MM MISC, Inject 10 each into the skin as needed., Disp: 100 each, Rfl: 2 .  liraglutide (VICTOZA) 18 MG/3ML SOPN, Inject 0.1-0.3 mLs (0.6-1.8 mg total) into the skin daily., Disp: 9 mL, Rfl: 2 .   losartan-hydrochlorothiazide (HYZAAR) 100-25 MG tablet, Take 1 tablet by mouth daily., Disp: 30 tablet, Rfl: 5 .  oxyCODONE (ROXICODONE) 15 MG immediate release tablet, Take 1 tablet by mouth every 4 (four) hours., Disp: , Rfl:  .  PRISTIQ 50 MG 24 hr tablet, Take 1 tablet (50 mg total) by mouth daily., Disp: 30 tablet, Rfl: 5 .  QUEtiapine (SEROQUEL) 25 MG tablet, TAKE ONE TABLET BY MOUTH AT BEDTIME FOR SLEEP., Disp: 30 tablet, Rfl: 5 .  ranitidine (ZANTAC) 300 MG tablet, Take 1 tablet (300 mg total) by mouth daily as needed for heartburn., Disp: 30 tablet, Rfl: 5 .  tiZANidine (ZANAFLEX) 4 MG tablet, Take 1 tablet by mouth every 12 (twelve) hours., Disp: , Rfl:  .  triamcinolone (KENALOG) 0.025 % cream, Apply topically 2 (two) times daily., Disp: 453.6 g, Rfl: 0  No Known Allergies   ROS  Constitutional: Negative for fever, positive for  weight change.  Respiratory: Negative for cough and shortness of breath.   Cardiovascular: Negative for chest pain or palpitations.  Gastrointestinal: Negative for abdominal pain, no bowel changes.  Musculoskeletal: Negative for gait problem , positive for joint swelling.  Skin: Negative for rash.  Neurological: Negative for dizziness or headache.  No other specific complaints in a complete review of systems (except as listed in HPI above).  Objective  Vitals:   03/09/17 1544  BP: (!) 164/68  Pulse: 97  Resp: 16  Temp: 98.3 F (36.8 C)  SpO2: 93%  Weight: 217 lb 2 oz (98.5 kg)    Body mass index is 32.53 kg/m.  Physical Exam  Constitutional: Patient appears well-developed and well-nourished. Obese  No distress.  HEENT: head atraumatic, normocephalic, pupils equal and reactive to light, ears normal TM bilaterally, neck supple, throat within normal limits Cardiovascular: Normal rate, regular rhythm and normal heart sounds.  No murmur heard. 1 plus  BLE edema. Pulmonary/Chest: Effort normal and breath sounds normal. No respiratory  distress. Abdominal: Soft.  There is no tenderness. Psychiatric: Patient has a normal mood and affect. behavior is normal. Judgment and thought content normal. Muscular Skeletal: she has stiffness , no pain during palpation of hand, but has some swelling /puffiness, some tingling with Tinnel's sign. Decrease rom of wrist /flexion  Recent Results (from the past 2160 hour(s))  CBC with Differential     Status: Abnormal   Collection Time: 03/08/17  1:41 PM  Result Value Ref Range   WBC 7.8 3.6 - 11.0 K/uL   RBC 3.77 (L) 3.80 - 5.20 MIL/uL   Hemoglobin 11.9 (L) 12.0 - 16.0 g/dL   HCT 35.3 35.0 - 47.0 %  MCV 93.5 80.0 - 100.0 fL   MCH 31.6 26.0 - 34.0 pg   MCHC 33.8 32.0 - 36.0 g/dL   RDW 12.8 11.5 - 14.5 %   Platelets 241 150 - 440 K/uL   Neutrophils Relative % 65 %   Neutro Abs 5.2 1.4 - 6.5 K/uL   Lymphocytes Relative 24 %   Lymphs Abs 1.9 1.0 - 3.6 K/uL   Monocytes Relative 8 %   Monocytes Absolute 0.6 0.2 - 0.9 K/uL   Eosinophils Relative 2 %   Eosinophils Absolute 0.1 0 - 0.7 K/uL   Basophils Relative 1 %   Basophils Absolute 0.0 0 - 0.1 K/uL  Comprehensive metabolic panel     Status: Abnormal   Collection Time: 03/08/17  1:41 PM  Result Value Ref Range   Sodium 137 135 - 145 mmol/L   Potassium 3.6 3.5 - 5.1 mmol/L   Chloride 104 101 - 111 mmol/L   CO2 25 22 - 32 mmol/L   Glucose, Bld 94 65 - 99 mg/dL   BUN 13 6 - 20 mg/dL   Creatinine, Ser 0.50 0.44 - 1.00 mg/dL   Calcium 9.3 8.9 - 10.3 mg/dL   Total Protein 6.4 (L) 6.5 - 8.1 g/dL   Albumin 3.7 3.5 - 5.0 g/dL   AST 34 15 - 41 U/L   ALT 30 14 - 54 U/L   Alkaline Phosphatase 71 38 - 126 U/L   Total Bilirubin 0.5 0.3 - 1.2 mg/dL   GFR calc non Af Amer >60 >60 mL/min   GFR calc Af Amer >60 >60 mL/min    Comment: (NOTE) The eGFR has been calculated using the CKD EPI equation. This calculation has not been validated in all clinical situations. eGFR's persistently <60 mL/min signify possible Chronic Kidney Disease.     Anion gap 8 5 - 15      PHQ2/9: Depression screen [facility] 2/9 11/27/2016 08/26/2016 05/29/2016 02/19/2016 11/15/2015  Decreased Interest 0 0 0 0 0  Down, Depressed, Hopeless 0 0 0 0 0  PHQ - 2 Score 0 0 0 0 0  Altered sleeping - - - - -  Tired, decreased energy - - - - -  Change in appetite - - - - -  Feeling bad or failure about yourself  - - - - -  Trouble concentrating - - - - -  Moving slowly or fidgety/restless - - - - -  Suicidal thoughts - - - - -  PHQ-9 Score - - - - -  Difficult doing work/chores - - - - -     Fall Risk: Fall Risk  11/27/2016 08/26/2016 05/29/2016 02/19/2016 11/15/2015  Falls in the past year? No Yes No No No  Number falls in past yr: - 1 - - -  Injury with Fall? - No - - -     Assessment & Plan  1. Stiffness of hand joint, unspecified laterality  - C-reactive protein - Sedimentation rate - ANA,IFA RA Diag Pnl w/rflx Tit/Patn  2. Anemia, unspecified type  - Iron, TIBC and Ferritin Panel  3. Paresthesia of both hands  - Vitamin B12 - NCV with EMG(electromyography); Future  4. Insulin resistance  - metFORMIN (GLUCOPHAGE) 500 MG tablet; Take 1 tablet (500 mg total) by mouth 2 (two) times daily with a meal.  Dispense: 60 tablet; Refill: 3  5. Metabolic syndrome  She gained 13 lbs over the past 3 months  Her mother had a history of thyroid cancer therefore I  can't give her GLP-1 agonist , denies personal history of pancreatitis  - metFORMIN (GLUCOPHAGE) 500 MG tablet; Take 1 tablet (500 mg total) by mouth 2 (two) times daily with a meal.  Dispense: 60 tablet; Refill: 3  6. Weight gain  - TSH  7. Grieving  Discussed hospice grieving counseling  8. Arthralgia of right temporomandibular joint  Advised to stop chewing gum, but she states she can't do that

## 2017-03-10 LAB — IRON,TIBC AND FERRITIN PANEL
%SAT: 22 % (ref 11–50)
Ferritin: 92 ng/mL (ref 10–232)
Iron: 66 ug/dL (ref 45–160)
TIBC: 306 ug/dL (ref 250–450)

## 2017-03-10 LAB — SEDIMENTATION RATE: SED RATE: 20 mm/h (ref 0–30)

## 2017-03-10 LAB — VITAMIN B12: Vitamin B-12: 365 pg/mL (ref 200–1100)

## 2017-03-10 LAB — ANA,IFA RA DIAG PNL W/RFLX TIT/PATN
Anti Nuclear Antibody(ANA): NEGATIVE
Cyclic Citrullin Peptide Ab: <16 Units

## 2017-03-10 LAB — C-REACTIVE PROTEIN: CRP: 19.3 mg/L — ABNORMAL HIGH (ref <8.0)

## 2017-03-16 ENCOUNTER — Telehealth: Payer: Self-pay

## 2017-03-16 ENCOUNTER — Other Ambulatory Visit: Payer: Self-pay

## 2017-03-16 DIAGNOSIS — R202 Paresthesia of skin: Secondary | ICD-10-CM

## 2017-03-16 NOTE — Telephone Encounter (Signed)
-----   Message from Steele Sizer, MD sent at 03/10/2017  3:24 PM EDT ----- Negative for lupus and RA Normal TSH, sed rate Normal iron storage She has synovitis and elevated c-reactive protein - if negative NCS we will refer her to Rheumatologist  B12 is normal, but towards low end of normal

## 2017-03-16 NOTE — Telephone Encounter (Signed)
I thought it had been ordered.  She can also come in for B12 injection since B12 is towards low end of normal

## 2017-03-16 NOTE — Telephone Encounter (Signed)
Patient called stating that her hands are still numb and tingling and is requesting a call back to let her know what to do.  She mentioned that she was supposed to get setup for a nerve conduction study.  Please advise

## 2017-03-16 NOTE — Telephone Encounter (Signed)
Patient has been scheduled to have her nerve conduction study at [facility] on 04/05/17 @ 8:30am.   Information has been faxed to their office.

## 2017-04-05 DIAGNOSIS — G894 Chronic pain syndrome: Secondary | ICD-10-CM | POA: Diagnosis not present

## 2017-04-05 DIAGNOSIS — M5417 Radiculopathy, lumbosacral region: Secondary | ICD-10-CM | POA: Diagnosis not present

## 2017-04-05 DIAGNOSIS — M79604 Pain in right leg: Secondary | ICD-10-CM | POA: Diagnosis not present

## 2017-04-05 DIAGNOSIS — M79605 Pain in left leg: Secondary | ICD-10-CM | POA: Diagnosis not present

## 2017-04-05 DIAGNOSIS — G4733 Obstructive sleep apnea (adult) (pediatric): Secondary | ICD-10-CM | POA: Diagnosis not present

## 2017-05-04 DIAGNOSIS — M5417 Radiculopathy, lumbosacral region: Secondary | ICD-10-CM | POA: Diagnosis not present

## 2017-05-04 DIAGNOSIS — G894 Chronic pain syndrome: Secondary | ICD-10-CM | POA: Diagnosis not present

## 2017-05-04 DIAGNOSIS — M79605 Pain in left leg: Secondary | ICD-10-CM | POA: Diagnosis not present

## 2017-05-04 DIAGNOSIS — G4733 Obstructive sleep apnea (adult) (pediatric): Secondary | ICD-10-CM | POA: Diagnosis not present

## 2017-05-04 DIAGNOSIS — M79604 Pain in right leg: Secondary | ICD-10-CM | POA: Diagnosis not present

## 2017-05-14 ENCOUNTER — Telehealth: Payer: Self-pay | Admitting: Family Medicine

## 2017-05-25 ENCOUNTER — Encounter: Payer: Medicare Other | Admitting: Family Medicine

## 2017-05-28 ENCOUNTER — Other Ambulatory Visit: Payer: Self-pay | Admitting: Family Medicine

## 2017-05-28 DIAGNOSIS — I10 Essential (primary) hypertension: Secondary | ICD-10-CM

## 2017-05-28 DIAGNOSIS — F5104 Psychophysiologic insomnia: Secondary | ICD-10-CM

## 2017-05-28 DIAGNOSIS — F329 Major depressive disorder, single episode, unspecified: Secondary | ICD-10-CM

## 2017-05-28 NOTE — Telephone Encounter (Signed)
Patient requesting refill of Hyzaar, Seroquel to [facility].

## 2017-06-02 ENCOUNTER — Ambulatory Visit (INDEPENDENT_AMBULATORY_CARE_PROVIDER_SITE_OTHER): Payer: Medicare Other | Admitting: Family Medicine

## 2017-06-02 ENCOUNTER — Encounter: Payer: Self-pay | Admitting: Family Medicine

## 2017-06-02 VITALS — BP 132/84 | HR 84 | Temp 98.2°F | Resp 16 | Ht 69.0 in | Wt 203.9 lb

## 2017-06-02 DIAGNOSIS — K582 Mixed irritable bowel syndrome: Secondary | ICD-10-CM

## 2017-06-02 DIAGNOSIS — F341 Dysthymic disorder: Secondary | ICD-10-CM | POA: Diagnosis not present

## 2017-06-02 DIAGNOSIS — G8929 Other chronic pain: Secondary | ICD-10-CM | POA: Diagnosis not present

## 2017-06-02 DIAGNOSIS — E8881 Metabolic syndrome: Secondary | ICD-10-CM

## 2017-06-02 DIAGNOSIS — M5442 Lumbago with sciatica, left side: Secondary | ICD-10-CM

## 2017-06-02 DIAGNOSIS — J449 Chronic obstructive pulmonary disease, unspecified: Secondary | ICD-10-CM

## 2017-06-02 DIAGNOSIS — F5104 Psychophysiologic insomnia: Secondary | ICD-10-CM | POA: Diagnosis not present

## 2017-06-02 DIAGNOSIS — M5441 Lumbago with sciatica, right side: Secondary | ICD-10-CM

## 2017-06-02 DIAGNOSIS — M170 Bilateral primary osteoarthritis of knee: Secondary | ICD-10-CM | POA: Diagnosis not present

## 2017-06-02 DIAGNOSIS — F329 Major depressive disorder, single episode, unspecified: Secondary | ICD-10-CM

## 2017-06-02 MED ORDER — MELOXICAM 7.5 MG PO TABS: 7.5000 mg | ORAL_TABLET | Freq: Every day | ORAL | 1 refills | Status: DC

## 2017-06-02 MED ORDER — QUETIAPINE FUMARATE 25 MG PO TABS: 25.0000 mg | ORAL_TABLET | Freq: Every day | ORAL | 5 refills | Status: DC

## 2017-06-02 MED ORDER — AMITRIPTYLINE HCL 10 MG PO TABS: 10.0000 mg | ORAL_TABLET | Freq: Every day | ORAL | 5 refills | Status: DC

## 2017-06-02 NOTE — Progress Notes (Addendum)
Name: [name]   MRN: [id number]    DOB: 04-Jan-1961   Date:06/02/2017       Progress Note  Subjective  Chief Complaint  Chief Complaint  Patient presents with  . Medication Refill    HPI  Hand pain and paresthesia: She also has a burning sensation that is intermittent. She states symptoms are worse at night, she states that shaking her hands seems to help with symptoms. She has noticed decrease in rom of wrist, and sometimes hands feels warm, but not red. She went to [facility] in May because pain was so intense.  Does not use heat or ice on the area; is given gabapentin by pain management and this helps a little bit.  TMJ: she has intermittent pain on right jaw, worse when chewing, she states when she stopped chewing gum it decrease the symptoms. Pain is aching and at times sharp. No jaw locking.  Metabolic syndrome: Lost 62BJS since last visit, still having some diarrhea from the Metformin but it tolerating it well and wants to stay on it. She denies polyphagia, polydipsia or polyuria. Discussed GLP-1 agonist, but she states her mother has a history of thyroid cancer. She declines referral to dietician/nutrition today; unable to exercise secondary to chronic pain.  HTN: Is at goal today,  No chest pain or shortness of breath; some foot swelling occasionally that goes down when she props her feet up at night. Checks BP at home on occasion and it usually runs 130's /80's - never drops low, rarely goes above 160.  Bilateral Knee Osteoarthritis: Saw Dr. Rudene Christians on 11/25/2016 and was told to follow up only as needed. Was prescribed Meloxicam and says that this worked really well, but that she is out now and would like a refill.  She has been taking it daily at this time - we discussed decreasing to once every 2-4 days/only as needed; kidney function may 2018 was normal.  Has some some swelling in bilateral knees on occasion. Worst pain 8/10, best pain 5/10.   Depression Major: Mother passed away in  2016/11/19 - had CHF, pacemaker/defib and died of an MI - pt was an only child; youngest daughter is back home from [place] for the next few weeks; middle daughter got married in 07/2016; oldest daughter doing well. She is taking medication, she does not want referral to Psychiatrist. She denies crying spells, no suicidal thoughts or ideation, feeling fair overall, still has trouble falling asleep some days. PHQ-9 Score: 4 today.  Needs refills today.  COPD: She is still smoking 5 cigarettes a day and she is not ready to quit at this time, no cough, no wheezing or SOB, No longer taking Advair but only prn, also using Spiriva prn.  Chronic back pain: she goes to [facility] in [place] and the pain has been stable at around 7/10 on her back, but can get higher. Pain management prescribe Evzio, gabapentin, oxycodone, and tizanidine.   Patient Active Problem List   Diagnosis Date Noted  . Osteoarthritis of both knees 02/19/2016  . COPD, mild ([place]) 05/08/2015  . Vitamin D deficiency 05/07/2015  . Metabolic syndrome 28/31/5176  . Eczema 05/07/2015  . Chronic insomnia 05/07/2015  . Hypertension, benign 05/07/2015  . Dyslipidemia 05/07/2015  . Hyperglycemia 05/07/2015  . Chronic low back pain 05/07/2015  . IBS (irritable bowel syndrome) 05/07/2015  . Gastroesophageal reflux disease without esophagitis 05/07/2015  . Major depression, chronic ([place]) 05/07/2015  . Menopausal syndrome (hot flashes)  05/07/2015  . History of postmenopausal bleeding 01/14/2015    Past Surgical History:  Procedure Laterality Date  . BREAST BIOPSY Left    neg  . BREAST LUMPECTOMY    . CERVICAL DISCECTOMY     x 2  . CHOLECYSTECTOMY    . DILATATION & CURETTAGE/HYSTEROSCOPY WITH MYOSURE N/A 03/21/2015   Procedure: DILATATION & CURETTAGE/HYSTEROSCOPY;  Surgeon: Aletha Halim, MD;  Location: [facility] ORS;  Service: Gynecology;  Laterality: N/A;  . ENDOMETRIAL ABLATION    . SPINAL FUSION    .  TONSILLECTOMY    . TUBAL LIGATION      Family History  Problem Relation Age of Onset  . Heart disease Mother   . Thyroid cancer Mother   . Depression Daughter   . Asthma Daughter     Social History   Social History  . Marital status: Married    Spouse name: N/A  . Number of children: N/A  . Years of education: N/A   Occupational History  . Not on file.   Social History Main Topics  . Smoking status: Current Every Day Smoker    Packs/day: 0.25    Years: 15.00    Types: Cigarettes  . Smokeless tobacco: Current User  . Alcohol use No  . Drug use: No  . Sexual activity: Yes   Other Topics Concern  . Not on file   Social History Narrative  . No narrative on file     Current Outpatient Prescriptions:  .  acetaminophen (TYLENOL) 500 MG tablet, Take 1 tablet (500 mg total) by mouth every 6 (six) hours as needed., Disp: 30 tablet, Rfl: 2 .  amitriptyline (ELAVIL) 10 MG tablet, Take 1 tablet (10 mg total) by mouth at bedtime., Disp: 30 tablet, Rfl: 5 .  etodolac (LODINE) 400 MG tablet, Take 1 tablet (400 mg total) by mouth 2 (two) times daily., Disp: 20 tablet, Rfl: 0 .  EVZIO 0.4 MG/0.4ML SOAJ, , Disp: , Rfl:  .  gabapentin (NEURONTIN) 300 MG capsule, Take 1 capsule by mouth every 12 (twelve) hours., Disp: , Rfl:  .  losartan-hydrochlorothiazide (HYZAAR) 100-25 MG tablet, TAKE ONE TABLET BY MOUTH EVERY DAY, Disp: 30 tablet, Rfl: 0 .  meloxicam (MOBIC) 7.5 MG tablet, Take 1 tablet (7.5 mg total) by mouth daily., Disp: 30 tablet, Rfl: 1 .  metFORMIN (GLUCOPHAGE) 500 MG tablet, Take 1 tablet (500 mg total) by mouth 2 (two) times daily with a meal., Disp: 60 tablet, Rfl: 3 .  oxyCODONE (ROXICODONE) 15 MG immediate release tablet, Take 1 tablet by mouth every 4 (four) hours., Disp: , Rfl:  .  PRISTIQ 50 MG 24 hr tablet, Take 1 tablet (50 mg total) by mouth daily., Disp: 30 tablet, Rfl: 5 .  QUEtiapine (SEROQUEL) 25 MG tablet, Take 1 tablet (25 mg total) by mouth at bedtime.,  Disp: 30 tablet, Rfl: 5 .  ranitidine (ZANTAC) 300 MG tablet, Take 1 tablet (300 mg total) by mouth daily as needed for heartburn., Disp: 30 tablet, Rfl: 5 .  tiZANidine (ZANAFLEX) 4 MG tablet, Take 1 tablet by mouth every 12 (twelve) hours., Disp: , Rfl:  .  triamcinolone (KENALOG) 0.025 % cream, Apply topically 2 (two) times daily., Disp: 453.6 g, Rfl: 0  No Known Allergies   ROS  Constitutional: Negative for fever or weight change.  Respiratory: Negative for cough and shortness of breath.   Cardiovascular: Negative for chest pain or palpitations.  Gastrointestinal: Negative for abdominal pain, no bowel changes.  Musculoskeletal: Negative  for gait problem or joint swelling.  Skin: Negative for rash.  Neurological: Negative for dizziness or headache.  No other specific complaints in a complete review of systems (except as listed in HPI above).  Objective  Vitals:   06/02/17 1517  BP: 132/84  Pulse: 84  Resp: 16  Temp: 98.2 F (36.8 C)  TempSrc: Oral  SpO2: 96%  Weight: 203 lb 14.4 oz (92.5 kg)  Height: '5\' 9"'$  (1.753 m)   Body mass index is 30.11 kg/m.  Physical Exam Constitutional: Patient appears well-developed and well-nourished. Obese No distress.  HEENT: head atraumatic, normocephalic Cardiovascular: Normal rate, regular rhythm and normal heart sounds.  No murmur heard. No BLE edema. Pulmonary/Chest: Effort normal and breath sounds normal. No respiratory distress. Psychiatric: Patient has a normal mood and affect. behavior is normal. Judgment and thought content normal.  Recent Results (from the past 2160 hour(s))  CBC with Differential     Status: Abnormal   Collection Time: 03/08/17  1:41 PM  Result Value Ref Range   WBC 7.8 3.6 - 11.0 K/uL   RBC 3.77 (L) 3.80 - 5.20 MIL/uL   Hemoglobin 11.9 (L) 12.0 - 16.0 g/dL   HCT 35.3 35.0 - 47.0 %   MCV 93.5 80.0 - 100.0 fL   MCH 31.6 26.0 - 34.0 pg   MCHC 33.8 32.0 - 36.0 g/dL   RDW 12.8 11.5 - 14.5 %   Platelets  241 150 - 440 K/uL   Neutrophils Relative % 65 %   Neutro Abs 5.2 1.4 - 6.5 K/uL   Lymphocytes Relative 24 %   Lymphs Abs 1.9 1.0 - 3.6 K/uL   Monocytes Relative 8 %   Monocytes Absolute 0.6 0.2 - 0.9 K/uL   Eosinophils Relative 2 %   Eosinophils Absolute 0.1 0 - 0.7 K/uL   Basophils Relative 1 %   Basophils Absolute 0.0 0 - 0.1 K/uL  Comprehensive metabolic panel     Status: Abnormal   Collection Time: 03/08/17  1:41 PM  Result Value Ref Range   Sodium 137 135 - 145 mmol/L   Potassium 3.6 3.5 - 5.1 mmol/L   Chloride 104 101 - 111 mmol/L   CO2 25 22 - 32 mmol/L   Glucose, Bld 94 65 - 99 mg/dL   BUN 13 6 - 20 mg/dL   Creatinine, Ser 0.50 0.44 - 1.00 mg/dL   Calcium 9.3 8.9 - 10.3 mg/dL   Total Protein 6.4 (L) 6.5 - 8.1 g/dL   Albumin 3.7 3.5 - 5.0 g/dL   AST 34 15 - 41 U/L   ALT 30 14 - 54 U/L   Alkaline Phosphatase 71 38 - 126 U/L   Total Bilirubin 0.5 0.3 - 1.2 mg/dL   GFR calc non Af Amer >60 >60 mL/min   GFR calc Af Amer >60 >60 mL/min    Comment: (NOTE) The eGFR has been calculated using the CKD EPI equation. This calculation has not been validated in all clinical situations. eGFR's persistently <60 mL/min signify possible Chronic Kidney Disease.    Anion gap 8 5 - 15  Vitamin B12     Status: None   Collection Time: 03/09/17  4:33 PM  Result Value Ref Range   Vitamin B-12 365 200 - 1,100 pg/mL  Iron, TIBC and Ferritin Panel     Status: None   Collection Time: 03/09/17  4:33 PM  Result Value Ref Range   Ferritin 92 10 - 232 ng/mL   Iron 66 45 -  160 ug/dL   TIBC 306 250 - 450 ug/dL   %SAT 22 11 - 50 %  C-reactive protein     Status: Abnormal   Collection Time: 03/09/17  4:33 PM  Result Value Ref Range   CRP 19.3 (H) <8.0 mg/L  Sedimentation rate     Status: None   Collection Time: 03/09/17  4:33 PM  Result Value Ref Range   Sed Rate 20 0 - 30 mm/hr  TSH     Status: None   Collection Time: 03/09/17  4:33 PM  Result Value Ref Range   TSH 0.71 mIU/L     Comment:   Reference Range   > or = 20 Years  0.40-4.50   Pregnancy Range First trimester  0.26-2.66 Second trimester 0.55-2.73 Third trimester  0.43-2.91     ANA,IFA RA Diag Pnl w/rflx Tit/Patn     Status: None   Collection Time: 03/09/17  4:33 PM  Result Value Ref Range   Rhuematoid fact SerPl-aCnc <14 <14 IU/mL   Anit Nuclear Antibody(ANA) NEG NEGATIVE   Cyclic Citrullin Peptide Ab <16 Units    Comment:   Reference Range Negative               < 20 Weak Positive            20 - 39 Moderate Positive        40 - 59 Strong Positive        > 59    PHQ2/9: Depression screen [facility] 2/9 06/02/2017 11/27/2016 08/26/2016 05/29/2016 02/19/2016  Decreased Interest 1 0 0 0 0  Down, Depressed, Hopeless 1 0 0 0 0  PHQ - 2 Score 2 0 0 0 0  Altered sleeping 1 - - - -  Tired, decreased energy 0 - - - -  Change in appetite 1 - - - -  Feeling bad or failure about yourself  0 - - - -  Trouble concentrating 0 - - - -  Moving slowly or fidgety/restless 0 - - - -  Suicidal thoughts 0 - - - -  PHQ-9 Score 4 - - - -  Difficult doing work/chores - - - - -   Fall Risk: Fall Risk  11/27/2016 08/26/2016 05/29/2016 02/19/2016 11/15/2015  Falls in the past year? No Yes No No No  Number falls in past yr: - 1 - - -  Injury with Fall? - No - - -   Assessment & Plan  1. Major depression, chronic - QUEtiapine (SEROQUEL) 25 MG tablet; Take 1 tablet (25 mg total) by mouth at bedtime.  Dispense: 30 tablet; Refill: 5 - Stable  2. Chronic insomnia - QUEtiapine (SEROQUEL) 25 MG tablet; Take 1 tablet (25 mg total) by mouth at bedtime.  Dispense: 30 tablet; Refill: 5 - Stable  3. Irritable bowel syndrome with both constipation and diarrhea - amitriptyline (ELAVIL) 10 MG tablet; Take 1 tablet (10 mg total) by mouth at bedtime.  Dispense: 30 tablet; Refill: 5  4. Primary osteoarthritis of both knees - meloxicam (MOBIC) 7.5 MG tablet; Take 1 tablet (7.5 mg total) by mouth daily.  Dispense: 30 tablet; Refill:  1 - Will take only PRN - pt agrees to try to make these last 4-6 months.  If worsening, we will consider referring back to ortho.   5. COPD, mild ([place]) Stable - doesn't want to take medications, not ready to quit smoking.  6. Metabolic syndrome Continue metformin and diet changes Had labs done in May 2018  and CBC and CMP were WNL  7. Chronic midline low back pain with bilateral sciatica Continue with pain management  -Reviewed Health Maintenance: Has AWV scheduled for 07/26/2017  I have reviewed this encounter including the documentation in this note and/or discussed this patient with the Johney Maine, FNP, NP-C. I am certifying that I agree with the content of this note as supervising physician.  Steele Sizer, MD [facility] 06/02/2017, 9:36 PM

## 2017-06-04 DIAGNOSIS — G894 Chronic pain syndrome: Secondary | ICD-10-CM | POA: Diagnosis not present

## 2017-06-04 DIAGNOSIS — M79605 Pain in left leg: Secondary | ICD-10-CM | POA: Diagnosis not present

## 2017-06-04 DIAGNOSIS — M79604 Pain in right leg: Secondary | ICD-10-CM | POA: Diagnosis not present

## 2017-06-04 DIAGNOSIS — M5417 Radiculopathy, lumbosacral region: Secondary | ICD-10-CM | POA: Diagnosis not present

## 2017-06-04 DIAGNOSIS — G4733 Obstructive sleep apnea (adult) (pediatric): Secondary | ICD-10-CM | POA: Diagnosis not present

## 2017-07-06 DIAGNOSIS — M545 Low back pain: Secondary | ICD-10-CM | POA: Diagnosis not present

## 2017-07-06 DIAGNOSIS — M79661 Pain in right lower leg: Secondary | ICD-10-CM | POA: Diagnosis not present

## 2017-07-06 DIAGNOSIS — M25512 Pain in left shoulder: Secondary | ICD-10-CM | POA: Diagnosis not present

## 2017-07-06 DIAGNOSIS — G894 Chronic pain syndrome: Secondary | ICD-10-CM | POA: Diagnosis not present

## 2017-07-06 DIAGNOSIS — M25552 Pain in left hip: Secondary | ICD-10-CM | POA: Diagnosis not present

## 2017-07-06 DIAGNOSIS — M79662 Pain in left lower leg: Secondary | ICD-10-CM | POA: Diagnosis not present

## 2017-07-06 DIAGNOSIS — M25551 Pain in right hip: Secondary | ICD-10-CM | POA: Diagnosis not present

## 2017-07-06 DIAGNOSIS — M25511 Pain in right shoulder: Secondary | ICD-10-CM | POA: Diagnosis not present

## 2017-07-10 ENCOUNTER — Other Ambulatory Visit: Payer: Self-pay | Admitting: Family Medicine

## 2017-07-10 DIAGNOSIS — I10 Essential (primary) hypertension: Secondary | ICD-10-CM

## 2017-07-26 ENCOUNTER — Encounter: Payer: Self-pay | Admitting: Family Medicine

## 2017-07-26 ENCOUNTER — Other Ambulatory Visit: Payer: Self-pay | Admitting: Family Medicine

## 2017-07-26 ENCOUNTER — Ambulatory Visit (INDEPENDENT_AMBULATORY_CARE_PROVIDER_SITE_OTHER): Payer: Medicare Other | Admitting: Family Medicine

## 2017-07-26 VITALS — BP 118/64 | HR 108 | Temp 97.9°F | Resp 18 | Ht 68.25 in | Wt 207.6 lb

## 2017-07-26 DIAGNOSIS — F329 Major depressive disorder, single episode, unspecified: Secondary | ICD-10-CM

## 2017-07-26 DIAGNOSIS — E8881 Metabolic syndrome: Secondary | ICD-10-CM | POA: Diagnosis not present

## 2017-07-26 DIAGNOSIS — Z113 Encounter for screening for infections with a predominantly sexual mode of transmission: Secondary | ICD-10-CM

## 2017-07-26 DIAGNOSIS — M79642 Pain in left hand: Secondary | ICD-10-CM

## 2017-07-26 DIAGNOSIS — E785 Hyperlipidemia, unspecified: Secondary | ICD-10-CM

## 2017-07-26 DIAGNOSIS — Z79899 Other long term (current) drug therapy: Secondary | ICD-10-CM | POA: Diagnosis not present

## 2017-07-26 DIAGNOSIS — Z0000 Encounter for general adult medical examination without abnormal findings: Secondary | ICD-10-CM | POA: Diagnosis not present

## 2017-07-26 DIAGNOSIS — Z23 Encounter for immunization: Secondary | ICD-10-CM | POA: Diagnosis not present

## 2017-07-26 DIAGNOSIS — R7982 Elevated C-reactive protein (CRP): Secondary | ICD-10-CM

## 2017-07-26 DIAGNOSIS — R739 Hyperglycemia, unspecified: Secondary | ICD-10-CM

## 2017-07-26 DIAGNOSIS — D649 Anemia, unspecified: Secondary | ICD-10-CM | POA: Diagnosis not present

## 2017-07-26 DIAGNOSIS — Z1159 Encounter for screening for other viral diseases: Secondary | ICD-10-CM | POA: Diagnosis not present

## 2017-07-26 DIAGNOSIS — I10 Essential (primary) hypertension: Secondary | ICD-10-CM

## 2017-07-26 DIAGNOSIS — Z114 Encounter for screening for human immunodeficiency virus [HIV]: Secondary | ICD-10-CM

## 2017-07-26 MED ORDER — PRISTIQ 50 MG PO TB24: 50.0000 mg | ORAL_TABLET | Freq: Every day | ORAL | 5 refills | Status: DC

## 2017-07-26 MED ORDER — LOSARTAN POTASSIUM-HCTZ 100-25 MG PO TABS: 1.0000 | ORAL_TABLET | Freq: Every day | ORAL | 5 refills | Status: DC

## 2017-07-26 NOTE — Progress Notes (Signed)
Patient: [name], Female    DOB: January 28, 1961, 56 y.o.   MRN: [id number]  Visit Date: 07/26/2017  Today's Provider: Loistine Chance, MD   Chief Complaint  Patient presents with  . Annual Exam    Subjective:    HPI [name] is a 56 y.o. female who presents today for her Subsequent Annual Wellness Visit.  Patient/Caregiver input:    Left hand pain: seen by NP Raelyn Ensign last month for regular follow and complaints of left hand pain that radiates to left elbow, pain is described as soreness, she ordered labs and NCS ( however she was told about NCS the day of appointment and could not go), C-reactive protein was high. Pain can be intense 10/10, currently 8/10. Explained importance of going for NCS, and we may need referral to Rheumatologist. She is feeling weak on left arm, she is left handed. She has mild neck pain but mild, radiates to shoulders, normal rom of both shoulders. She goes to the pain clinic  Review of Systems  Constitutional: Negative for fever or weight change.  Respiratory: Negative for cough and shortness of breath.   Cardiovascular: Negative for chest pain or palpitations.  Gastrointestinal: Negative for abdominal pain, no bowel changes.  Musculoskeletal: Negative for gait problem or joint swelling.  Skin: Negative for rash.  Neurological: Negative for dizziness or headache.  No other specific complaints in a complete review of systems (except as listed in HPI above).  Past Medical History:  Diagnosis Date  . Contact dermatitis and other eczema, due to unspecified cause   . COPD (chronic obstructive pulmonary disease) (Cataract)   . Depressive disorder   . Dysmetabolic syndrome X   . Hyperlipidemia   . Hypertension   . IBS (irritable bowel syndrome)   . Insomnia   . Lumbago   . Nonspecific abnormal electrocardiogram (ECG) (EKG)   . Other ovarian failure(256.39)   . Postmenopausal atrophic vaginitis   . Symptomatic menopausal or female climacteric  states   . Unspecified vitamin D deficiency     Past Surgical History:  Procedure Laterality Date  . BREAST BIOPSY Left    neg  . BREAST LUMPECTOMY    . CERVICAL DISCECTOMY     x 2  . CHOLECYSTECTOMY    . DILATATION & CURETTAGE/HYSTEROSCOPY WITH MYOSURE N/A 03/21/2015   Procedure: DILATATION & CURETTAGE/HYSTEROSCOPY;  Surgeon: Aletha Halim, MD;  Location: [facility] ORS;  Service: Gynecology;  Laterality: N/A;  . ENDOMETRIAL ABLATION    . SPINAL FUSION    . TONSILLECTOMY    . TUBAL LIGATION      Family History  Problem Relation Age of Onset  . Heart disease Mother   . Thyroid cancer Mother   . Depression Daughter   . Asthma Daughter     Social History   Social History  . Marital status: Married    Spouse name: N/A  . Number of children: N/A  . Years of education: N/A   Occupational History  . Not on file.   Social History Main Topics  . Smoking status: Current Every Day Smoker    Packs/day: 0.25    Years: 15.00    Types: Cigarettes    Start date: 07/26/2002  . Smokeless tobacco: Never Used  . Alcohol use No  . Drug use: No  . Sexual activity: Yes    Partners: Male    Birth control/ protection: Other-see comments     Comment: Ablation   Other Topics Concern  .  Not on file   Social History Narrative  . No narrative on file    Outpatient Encounter Prescriptions as of 07/26/2017  Medication Sig Note  . acetaminophen (TYLENOL) 500 MG tablet Take 1 tablet (500 mg total) by mouth every 6 (six) hours as needed.   Marland Kitchen amitriptyline (ELAVIL) 10 MG tablet Take 1 tablet (10 mg total) by mouth at bedtime.   Marland Kitchen EVZIO 0.4 MG/0.4ML SOAJ  08/08/2015: Received from: External Pharmacy  . gabapentin (NEURONTIN) 300 MG capsule Take 1 capsule by mouth every 12 (twelve) hours. 05/08/2015: Received from: External Pharmacy Received Sig:   . losartan-hydrochlorothiazide (HYZAAR) 100-25 MG tablet TAKE ONE TABLET BY MOUTH EVERY DAY   . meloxicam (MOBIC) 7.5 MG tablet Take 1 tablet (7.5 mg  total) by mouth daily.   . metFORMIN (GLUCOPHAGE) 500 MG tablet Take 1 tablet (500 mg total) by mouth 2 (two) times daily with a meal.   . oxyCODONE (ROXICODONE) 15 MG immediate release tablet Take 1 tablet by mouth every 4 (four) hours. 05/08/2015: Received from: External Pharmacy Received Sig:   . PRISTIQ 50 MG 24 hr tablet Take 1 tablet (50 mg total) by mouth daily.   . QUEtiapine (SEROQUEL) 25 MG tablet Take 1 tablet (25 mg total) by mouth at bedtime.   . ranitidine (ZANTAC) 300 MG tablet Take 1 tablet (300 mg total) by mouth daily as needed for heartburn.   Marland Kitchen tiZANidine (ZANAFLEX) 4 MG tablet Take 1 tablet by mouth every 12 (twelve) hours. 05/08/2015: Received from: External Pharmacy Received Sig:   . triamcinolone (KENALOG) 0.025 % cream Apply topically 2 (two) times daily.   . [DISCONTINUED] etodolac (LODINE) 400 MG tablet Take 1 tablet (400 mg total) by mouth 2 (two) times daily. (Patient not taking: Reported on 07/26/2017)    No facility-administered encounter medications on file as of 07/26/2017.     No Known Allergies  Care Team Updated in EHR: Yes Pain clinic, Dr. Hilary Hertz - not in the system  Last Vision Exam: ?  Wears corrective lenses: Yes Last Dental Exam: every 6 month - Dr. Alesia Banda Last Hearing Exam: never had one Wears Hearing Aids: No  Functional Ability / Safety Screening 1.  Was the timed Get Up and Go test shorter than 30 seconds?  yes 2.  Does the patient need help with the phone, transportation, shopping,      preparing meals, housework, laundry, medications, or managing money?  yes 3.  Is the patient's home free of loose throw rugs in walkways, pet beds, electrical cords, etc?   yes      Grab bars in the bathroom? no      Handrails on the stairs?   N/A - no stairs in her house      Adequate lighting?   yes 4.  Has the patient noticed any hearing difficulties?   no  Diet Recall and Exercise Regimen:  Current Exercise Habits: The patient does not participate in  regular exercise at present    She needs to increase fruit and vegetables in her diet  Advanced Care Planning: A voluntary discussion about advance care planning including the explanation and discussion of advance directives.  Discussed health care proxy and Living will, and the patient was able to identify a health care proxy as husband and oldest daughter [name])   Patient does not have a living will at present time. If patient does have living will, I have requested they bring this to the clinic to  be scanned in to their chart. Does patient have a HCPOA?    no If yes, name and contact information:  Does patient have a living will or MOST form?  no  Cancer Screenings: Skin: no problems, does not like bags under eyes - explained not covered by insurance Lung:  Low Dose CT Chest recommended if Age 66-80 years, 30 pack-year currently smoking OR have quit w/in 15years. Patient does not qualify. Breast: Up to date on Mammogram? Yes  Up to date of Bone Density/Dexa? Not applicable Colon: up to date   Additional Screenings:  Hepatitis B/HIV/Syphillis: she would like to be checked Hepatitis C Screening: done 08/23/2012 Intimate Partner Violence: none   Objective:   Vitals: BP 118/64 (BP Location: Left Arm, Patient Position: Sitting, Cuff Size: Large)   Pulse (!) 108   Temp 97.9 F (36.6 C) (Oral)   Resp 18   Ht 5' 8.25" (1.734 m)   Wt 207 lb 9.6 oz (94.2 kg)   SpO2 96%   BMI 31.33 kg/m  Body mass index is 31.33 kg/m.  No exam data present  Physical Exam  Constitutional: Patient appears well-developed and obese. No distress.  HENT: Head: Normocephalic and atraumatic. Ears: B TMs ok, no erythema or effusion; Nose: Nose normal. Mouth/Throat: Oropharynx is clear and moist. No oropharyngeal exudate.  Eyes: Conjunctivae and EOM are normal. Pupils are equal, round, and reactive to light. No scleral icterus.  Neck: Normal range of motion. Neck supple. No JVD present. No  thyromegaly present.  Cardiovascular: Normal rate, regular rhythm and normal heart sounds.  No murmur heard. No BLE edema. Pulmonary/Chest: Effort normal and breath sounds normal. No respiratory distress. Abdominal: Soft. Bowel sounds are normal, no distension. There is no tenderness. no masses Breast: no lumps or masses, no nipple discharge or rashes FEMALE GENITALIA:  External genitalia normal External urethra normal Pelvic not done RECTAL: not done Musculoskeletal: Normal range of motion, no joint effusions. No gross deformities Neurological: he is alert and oriented to person, place, and time. No cranial nerve deficit. Coordination, balance, strength, speech and gait are normal.  Skin: Skin is warm and dry. No rash noted. No erythema.  Psychiatric: Patient has a normal mood and affect. behavior is normal. Judgment and thought content normal.  Cognitive Testing - 6-CIT  Correct? Score   What year is it? yes 0 Yes = 0    No = 4  What month is it? yes 0 Yes = 0    No = 3  Remember:     [address], [place], [place]     What time is it? yes 0 Yes = 0    No = 3  Count backwards from 20 to 1 yes 0 Correct = 0    1 error = 2   More than 1 error = 4  Say the months of the year in reverse. yes 0 Correct = 0    1 error = 2   More than 1 error = 4  What address did I ask you to remember? yes 0 Correct = 0  1 error = 2    2 error = 4    3 error = 6    4 error = 8    All wrong = 10       TOTAL SCORE  0/28   Interpretation:  Normal  Normal (0-7) Abnormal (8-28)   Fall Risk: Fall Risk  07/26/2017 11/27/2016 08/26/2016 05/29/2016 02/19/2016  Falls in  the past year? No No Yes No No  Number falls in past yr: - - 1 - -  Injury with Fall? - - No - -    Depression Screen Depression screen [facility] 2/9 07/26/2017 06/02/2017 11/27/2016 08/26/2016 05/29/2016  Decreased Interest 1 1 0 0 0  Down, Depressed, Hopeless 2 1 0 0 0  PHQ - 2 Score 3 2 0 0 0  Altered sleeping 1 1 - - -  Tired, decreased energy  0 0 - - -  Change in appetite 3 1 - - -  Feeling bad or failure about yourself  1 0 - - -  Trouble concentrating 0 0 - - -  Moving slowly or fidgety/restless 0 0 - - -  Suicidal thoughts 0 0 - - -  PHQ-9 Score 8 4 - - -  Difficult doing work/chores Not difficult at all - - - -   Still missing her mother and is in pain, does not want to change medication or see psychiatrist at this time  No results found for this or any previous visit (from the past 2160 hour(s)).  Assessment & Plan:    1. Medicare annual wellness visit, subsequent   Discussed importance of 150 minutes of physical activity weekly, eat two servings of fish weekly, eat one serving of tree nuts ( cashews, pistachios, pecans, almonds.Marland Kitchen) every other day, eat 6 servings of fruit/vegetables daily and drink plenty of water and avoid sweet beverages.  - COMPLETE METABOLIC PANEL WITH GFR  2. Needs flu shot  - Flu Vaccine QUAD 6+ mos PF IM (Fluarix Quad PF)  3. Elevated C-reactive protein  - C-reactive protein - Sedimentation rate - Rheumatoid Factor  4. Left hand pain  She missed appointment for NCS and advised her to call back to re-schedule - C-reactive protein - Sedimentation rate - Rheumatoid Factor  5. Insulin resistance  - Hemoglobin A1c - Insulin, fasting  6. Anemia, unspecified type  - CBC with Differential/Platelet  7. Dyslipidemia  - Lipid panel  8. Long-term use of high-risk medication  - COMPLETE METABOLIC PANEL WITH GFR  9. Hyperglycemia - Hemoglobin A1c - Insulin, fasting  10. Routine screening for STI (sexually transmitted infection)  - RPR - Hepatitis, Acute  11. Encounter for screening for HIV  - HIV antibody type dotphrase "dot"diagmed to refresh this list  Exercise Activities and Dietary recommendations  -exercise more and eat healthier  - Discussed health benefits of physical activity, and encouraged her to engage in regular exercise appropriate for her age and  condition.   Immunization History  Administered Date(s) Administered  . Influenza,inj,Quad PF,6+ Mos 08/08/2015, 08/26/2016, 07/26/2017  . Influenza-Unspecified 09/02/2013, 07/03/2014    Health Maintenance  Topic Date Due  . PAP SMEAR  01/14/2018  . MAMMOGRAM  10/01/2018  . COLONOSCOPY  08/03/2019  . TETANUS/TDAP  04/17/2020  . INFLUENZA VACCINE  Completed  . Hepatitis C Screening  Completed  . HIV Screening  Completed    No orders of the defined types were placed in this encounter.   Current Outpatient Prescriptions:  .  acetaminophen (TYLENOL) 500 MG tablet, Take 1 tablet (500 mg total) by mouth every 6 (six) hours as needed., Disp: 30 tablet, Rfl: 2 .  amitriptyline (ELAVIL) 10 MG tablet, Take 1 tablet (10 mg total) by mouth at bedtime., Disp: 30 tablet, Rfl: 5 .  EVZIO 0.4 MG/0.4ML SOAJ, , Disp: , Rfl:  .  gabapentin (NEURONTIN) 300 MG capsule, Take 1 capsule by mouth every 12 (  twelve) hours., Disp: , Rfl:  .  losartan-hydrochlorothiazide (HYZAAR) 100-25 MG tablet, TAKE ONE TABLET BY MOUTH EVERY DAY, Disp: 30 tablet, Rfl: 0 .  meloxicam (MOBIC) 7.5 MG tablet, Take 1 tablet (7.5 mg total) by mouth daily., Disp: 30 tablet, Rfl: 1 .  metFORMIN (GLUCOPHAGE) 500 MG tablet, Take 1 tablet (500 mg total) by mouth 2 (two) times daily with a meal., Disp: 60 tablet, Rfl: 3 .  oxyCODONE (ROXICODONE) 15 MG immediate release tablet, Take 1 tablet by mouth every 4 (four) hours., Disp: , Rfl:  .  PRISTIQ 50 MG 24 hr tablet, Take 1 tablet (50 mg total) by mouth daily., Disp: 30 tablet, Rfl: 5 .  QUEtiapine (SEROQUEL) 25 MG tablet, Take 1 tablet (25 mg total) by mouth at bedtime., Disp: 30 tablet, Rfl: 5 .  ranitidine (ZANTAC) 300 MG tablet, Take 1 tablet (300 mg total) by mouth daily as needed for heartburn., Disp: 30 tablet, Rfl: 5 .  tiZANidine (ZANAFLEX) 4 MG tablet, Take 1 tablet by mouth every 12 (twelve) hours., Disp: , Rfl:  .  triamcinolone (KENALOG) 0.025 % cream, Apply topically 2  (two) times daily., Disp: 453.6 g, Rfl: 0 Medications Discontinued During This Encounter  Medication Reason  . etodolac (LODINE) 400 MG tablet Completed Course    I have personally reviewed and addressed the Medicare Annual Wellness health risk assessment questionnaire and have noted the following in the patient's chart:  A.         Medical and social history & family history B.         Use of alcohol, tobacco, and illicit drugs  C.         Current medications and supplements D.         Functional and Cognitive ability and status E.         Nutritional status F.         Physical activity G.        Advance directives H.         List of other physicians I.          Hospitalizations, surgeries, and ER visits in previous 12 months J.         [place] such as hearing, vision, cognitive function, and depression L.         Referrals and appointments: may refer to Rheumatologist if labs still abnormal   In addition, I have reviewed and discussed with patient certain preventive protocols, quality metrics, and best practice recommendations. A written personalized care plan for preventive services as well as general preventive health recommendations were provided to patient.  See attached scanned questionnaire for additional information.

## 2017-07-26 NOTE — Telephone Encounter (Signed)
Pt was seen today. Asking for refill on pristiq and losartan. Please send to [facility].

## 2017-07-27 LAB — COMPLETE METABOLIC PANEL WITH GFR
AG RATIO: 2 (calc) (ref 1.0–2.5)
ALBUMIN MSPROF: 4.5 g/dL (ref 3.6–5.1)
ALKALINE PHOSPHATASE (APISO): 82 U/L (ref 33–130)
ALT: 22 U/L (ref 6–29)
AST: 20 U/L (ref 10–35)
BUN: 10 mg/dL (ref 7–25)
CO2: 25 mmol/L (ref 20–32)
CREATININE: 0.66 mg/dL (ref 0.50–1.05)
Calcium: 9.7 mg/dL (ref 8.6–10.4)
Chloride: 104 mmol/L (ref 98–110)
GFR, Est African American: 116 mL/min/{1.73_m2} (ref >=60)
GFR, Est Non African American: 100 mL/min/{1.73_m2} (ref >=60)
GLOBULIN: 2.3 g/dL (ref 1.9–3.7)
Glucose, Bld: 98 mg/dL (ref 65–139)
POTASSIUM: 4 mmol/L (ref 3.5–5.3)
SODIUM: 140 mmol/L (ref 135–146)
Total Bilirubin: 0.4 mg/dL (ref 0.2–1.2)
Total Protein: 6.8 g/dL (ref 6.1–8.1)

## 2017-07-27 LAB — LIPID PANEL
CHOL/HDL RATIO: 4.3 (calc) (ref <5.0)
CHOLESTEROL: 149 mg/dL (ref <200)
HDL: 35 mg/dL — AB (ref >50)
LDL CHOLESTEROL (CALC): 79 mg/dL
Non-HDL Cholesterol (Calc): 114 mg/dL (calc) (ref <130)
TRIGLYCERIDES: 266 mg/dL — AB (ref <150)

## 2017-07-27 LAB — HEPATITIS PANEL, ACUTE
HEP A IGM: NONREACTIVE
HEP B C IGM: NONREACTIVE
Hepatitis B Surface Ag: NONREACTIVE
Hepatitis C Ab: NONREACTIVE
SIGNAL TO CUT-OFF: 0.01 (ref <1.00)

## 2017-07-27 LAB — HEMOGLOBIN A1C
EAG (MMOL/L): 6.6 (calc)
Hgb A1c MFr Bld: 5.8 % of total Hgb — ABNORMAL HIGH (ref <5.7)
Mean Plasma Glucose: 120 (calc)

## 2017-07-27 LAB — CBC WITH DIFFERENTIAL/PLATELET
BASOS PCT: 0.3 %
Basophils Absolute: 20 cells/uL (ref 0–200)
EOS ABS: 98 {cells}/uL (ref 15–500)
Eosinophils Relative: 1.5 %
HEMATOCRIT: 38.9 % (ref 35.0–45.0)
HEMOGLOBIN: 13 g/dL (ref 11.7–15.5)
LYMPHS ABS: 2223 {cells}/uL (ref 850–3900)
MCH: 31 pg (ref 27.0–33.0)
MCHC: 33.4 g/dL (ref 32.0–36.0)
MCV: 92.8 fL (ref 80.0–100.0)
MPV: 11.1 fL (ref 7.5–12.5)
Monocytes Relative: 6.5 %
Neutro Abs: 3738 cells/uL (ref 1500–7800)
Neutrophils Relative %: 57.5 %
Platelets: 296 10*3/uL (ref 140–400)
RBC: 4.19 10*6/uL (ref 3.80–5.10)
RDW: 12.2 % (ref 11.0–15.0)
Total Lymphocyte: 34.2 %
WBC: 6.5 10*3/uL (ref 3.8–10.8)
WBCMIX: 423 {cells}/uL (ref 200–950)

## 2017-07-27 LAB — INSULIN, RANDOM: Insulin: 22.8 u[IU]/mL — ABNORMAL HIGH (ref 2.0–19.6)

## 2017-07-27 LAB — RPR: RPR Ser Ql: NONREACTIVE

## 2017-07-27 LAB — SEDIMENTATION RATE: Sed Rate: 17 mm/h (ref 0–30)

## 2017-07-27 LAB — C-REACTIVE PROTEIN: CRP: 12.9 mg/L — AB (ref <8.0)

## 2017-07-27 LAB — RHEUMATOID FACTOR: Rhuematoid fact SerPl-aCnc: <14 IU/mL (ref <14)

## 2017-07-27 LAB — HIV ANTIBODY (ROUTINE TESTING W REFLEX): HIV 1&2 Ab, 4th Generation: NONREACTIVE

## 2017-07-28 ENCOUNTER — Other Ambulatory Visit: Payer: Self-pay | Admitting: Family Medicine

## 2017-07-28 DIAGNOSIS — R7982 Elevated C-reactive protein (CRP): Secondary | ICD-10-CM | POA: Insufficient documentation

## 2017-07-28 DIAGNOSIS — M2550 Pain in unspecified joint: Secondary | ICD-10-CM

## 2017-07-28 NOTE — Addendum Note (Signed)
Addended by: Inda Coke on: 07/28/2017 10:31 AM   Modules accepted: Orders

## 2017-07-29 ENCOUNTER — Other Ambulatory Visit: Payer: Self-pay | Admitting: Family Medicine

## 2017-07-29 NOTE — Telephone Encounter (Signed)
Patient requesting refill of Ranitidine to [facility].

## 2017-08-04 DIAGNOSIS — G894 Chronic pain syndrome: Secondary | ICD-10-CM | POA: Diagnosis not present

## 2017-08-04 DIAGNOSIS — M25511 Pain in right shoulder: Secondary | ICD-10-CM | POA: Diagnosis not present

## 2017-08-04 DIAGNOSIS — M79661 Pain in right lower leg: Secondary | ICD-10-CM | POA: Diagnosis not present

## 2017-08-04 DIAGNOSIS — M25512 Pain in left shoulder: Secondary | ICD-10-CM | POA: Diagnosis not present

## 2017-08-04 DIAGNOSIS — M545 Low back pain: Secondary | ICD-10-CM | POA: Diagnosis not present

## 2017-08-04 DIAGNOSIS — M25552 Pain in left hip: Secondary | ICD-10-CM | POA: Diagnosis not present

## 2017-08-04 DIAGNOSIS — M25551 Pain in right hip: Secondary | ICD-10-CM | POA: Diagnosis not present

## 2017-08-04 DIAGNOSIS — M79662 Pain in left lower leg: Secondary | ICD-10-CM | POA: Diagnosis not present

## 2017-08-09 ENCOUNTER — Other Ambulatory Visit: Payer: Self-pay | Admitting: Family Medicine

## 2017-08-09 DIAGNOSIS — M79641 Pain in right hand: Secondary | ICD-10-CM | POA: Diagnosis not present

## 2017-08-09 DIAGNOSIS — R7982 Elevated C-reactive protein (CRP): Secondary | ICD-10-CM | POA: Insufficient documentation

## 2017-08-09 DIAGNOSIS — M19042 Primary osteoarthritis, left hand: Secondary | ICD-10-CM | POA: Diagnosis not present

## 2017-08-09 DIAGNOSIS — R200 Anesthesia of skin: Secondary | ICD-10-CM | POA: Diagnosis not present

## 2017-08-09 DIAGNOSIS — M170 Bilateral primary osteoarthritis of knee: Secondary | ICD-10-CM

## 2017-09-08 DIAGNOSIS — M25552 Pain in left hip: Secondary | ICD-10-CM | POA: Diagnosis not present

## 2017-09-08 DIAGNOSIS — M25512 Pain in left shoulder: Secondary | ICD-10-CM | POA: Diagnosis not present

## 2017-09-08 DIAGNOSIS — M79661 Pain in right lower leg: Secondary | ICD-10-CM | POA: Diagnosis not present

## 2017-09-08 DIAGNOSIS — G894 Chronic pain syndrome: Secondary | ICD-10-CM | POA: Diagnosis not present

## 2017-09-08 DIAGNOSIS — M545 Low back pain: Secondary | ICD-10-CM | POA: Diagnosis not present

## 2017-09-08 DIAGNOSIS — M25511 Pain in right shoulder: Secondary | ICD-10-CM | POA: Diagnosis not present

## 2017-09-08 DIAGNOSIS — M79662 Pain in left lower leg: Secondary | ICD-10-CM | POA: Diagnosis not present

## 2017-09-08 DIAGNOSIS — M25551 Pain in right hip: Secondary | ICD-10-CM | POA: Diagnosis not present

## 2017-09-21 DIAGNOSIS — R200 Anesthesia of skin: Secondary | ICD-10-CM | POA: Diagnosis not present

## 2017-10-06 DIAGNOSIS — G894 Chronic pain syndrome: Secondary | ICD-10-CM | POA: Diagnosis not present

## 2017-10-06 DIAGNOSIS — M79662 Pain in left lower leg: Secondary | ICD-10-CM | POA: Diagnosis not present

## 2017-10-06 DIAGNOSIS — M25552 Pain in left hip: Secondary | ICD-10-CM | POA: Diagnosis not present

## 2017-10-06 DIAGNOSIS — M542 Cervicalgia: Secondary | ICD-10-CM | POA: Diagnosis not present

## 2017-10-06 DIAGNOSIS — M545 Low back pain: Secondary | ICD-10-CM | POA: Diagnosis not present

## 2017-10-06 DIAGNOSIS — M25511 Pain in right shoulder: Secondary | ICD-10-CM | POA: Diagnosis not present

## 2017-10-06 DIAGNOSIS — Z79891 Long term (current) use of opiate analgesic: Secondary | ICD-10-CM | POA: Diagnosis not present

## 2017-10-06 DIAGNOSIS — M25551 Pain in right hip: Secondary | ICD-10-CM | POA: Diagnosis not present

## 2017-10-06 DIAGNOSIS — M79661 Pain in right lower leg: Secondary | ICD-10-CM | POA: Diagnosis not present

## 2017-10-06 DIAGNOSIS — G8929 Other chronic pain: Secondary | ICD-10-CM | POA: Diagnosis not present

## 2017-10-06 DIAGNOSIS — M25512 Pain in left shoulder: Secondary | ICD-10-CM | POA: Diagnosis not present

## 2017-10-21 ENCOUNTER — Encounter: Payer: Self-pay | Admitting: Family Medicine

## 2017-10-21 ENCOUNTER — Ambulatory Visit (INDEPENDENT_AMBULATORY_CARE_PROVIDER_SITE_OTHER): Payer: Medicare Other | Admitting: Family Medicine

## 2017-10-21 VITALS — BP 98/56 | HR 81 | Temp 98.1°F | Resp 16 | Ht 69.0 in | Wt 201.4 lb

## 2017-10-21 DIAGNOSIS — K582 Mixed irritable bowel syndrome: Secondary | ICD-10-CM | POA: Diagnosis not present

## 2017-10-21 DIAGNOSIS — E8881 Metabolic syndrome: Secondary | ICD-10-CM

## 2017-10-21 DIAGNOSIS — G894 Chronic pain syndrome: Secondary | ICD-10-CM | POA: Diagnosis not present

## 2017-10-21 DIAGNOSIS — K219 Gastro-esophageal reflux disease without esophagitis: Secondary | ICD-10-CM | POA: Diagnosis not present

## 2017-10-21 DIAGNOSIS — M170 Bilateral primary osteoarthritis of knee: Secondary | ICD-10-CM

## 2017-10-21 DIAGNOSIS — F5104 Psychophysiologic insomnia: Secondary | ICD-10-CM

## 2017-10-21 DIAGNOSIS — G5603 Carpal tunnel syndrome, bilateral upper limbs: Secondary | ICD-10-CM | POA: Diagnosis not present

## 2017-10-21 DIAGNOSIS — F329 Major depressive disorder, single episode, unspecified: Secondary | ICD-10-CM

## 2017-10-21 DIAGNOSIS — F341 Dysthymic disorder: Secondary | ICD-10-CM | POA: Diagnosis not present

## 2017-10-21 MED ORDER — METFORMIN HCL 500 MG PO TABS: 500.0000 mg | ORAL_TABLET | Freq: Every day | ORAL | 5 refills | Status: DC

## 2017-10-21 MED ORDER — LIDOCAINE 5 % EX PTCH: 1.0000 | MEDICATED_PATCH | CUTANEOUS | 0 refills | Status: DC

## 2017-10-21 MED ORDER — PRISTIQ 50 MG PO TB24: 50.0000 mg | ORAL_TABLET | Freq: Every day | ORAL | 5 refills | Status: DC

## 2017-10-21 MED ORDER — MELOXICAM 7.5 MG PO TABS: 7.5000 mg | ORAL_TABLET | Freq: Every day | ORAL | 5 refills | Status: DC

## 2017-10-21 MED ORDER — RANITIDINE HCL 300 MG PO TABS: ORAL_TABLET | ORAL | 5 refills | Status: DC

## 2017-10-21 MED ORDER — AMITRIPTYLINE HCL 10 MG PO TABS: 10.0000 mg | ORAL_TABLET | Freq: Every day | ORAL | 5 refills | Status: DC

## 2017-10-21 MED ORDER — QUETIAPINE FUMARATE 25 MG PO TABS: 25.0000 mg | ORAL_TABLET | Freq: Every day | ORAL | 5 refills | Status: DC

## 2017-10-21 NOTE — Progress Notes (Signed)
Name: [name]   MRN: [id number]    DOB: Mar 14, 1961   Date:10/21/2017       Progress Note  Subjective  Chief Complaint  Chief Complaint  Patient presents with  . Medication Refill  . Hypertension  . Insomnia  . Gastroesophageal Reflux    Doing well with Rantidine  . COPD  . Back Pain    Since yesterday her right lower back has been bothering her.   . Metabolic syndrome    HPI   Carpal tunnel:  She was seen by Dr Barb Merino, and referred to neurologist, diagnosed with carpal tunnel syndrome and is wearing a brace, she has noticed some improvement of burning and numbness sensation of both hands.   TMJ: she has intermittent pain on right jaw, worse when chewing, she states when she stopped chewing gum it decrease the symptoms. Pain is aching and at times sharp. she is chewing gum again, but not recent problems  Metabolic syndrome: Lost 72CNO previous to last visit and 3 more pounds since. No longer has diarrhea from Metformin, but only taking one pill daily. She denies polyphagia, polydipsia or polyuria. Discussed GLP-1 agonist, but she states her mother has a history of thyroid cancer. Continue life style modification. Discussed low HDL .   HTN: bp is low today, no dizziness.  No chest pain or shortness of breath; some foot swelling occasionally that goes down when she props her feet up at night.  BP went up last week because medication recall and she skipped the medication  Bilateral Knee Osteoarthritis: Saw Dr. Rudene Christians on 11/25/2016 and was told to follow up only as needed. Was prescribed Meloxicam and says it helps with symptoms, only taking medication prn.   Depression Major: Mother passed away in 12/10/16 - had CHF, pacemaker/defib and died of an MI - pt was an only child; youngest daughter is back home from [place] for the next few weeks; middle daughter got married in 07/2016; oldest daughter doing well.She is taking medication, she does not want referral to  Psychiatrist. She states this Holiday season is hard, first time without her mother, crying more, but does not want to change medication, she knows it will pass  COPD: She is still smoking 5 cigarettes a day and she is not ready to quit at this time, no cough, no wheezing or SOB. She stopped all inhalers.   Chronic back pain: she goes to [facility] in [place] and the pain has been stable at around 7/10 on her back, but can get higher. Pain management prescribe Evzio, gabapentin, oxycodone, and tizanidine. Having a flare, we will try lidoderm patch  Elevated c-reactive protein: follow up with Dr. Barb Merino  Patient Active Problem List   Diagnosis Date Noted  . Carpal tunnel syndrome on both sides 10/21/2017  . CRP elevated 08/09/2017  . Elevated C-reactive protein 07/28/2017  . Osteoarthritis of both knees 02/19/2016  . COPD, mild ([place]) 05/08/2015  . Vitamin D deficiency 05/07/2015  . Metabolic syndrome 70/96/2836  . Eczema 05/07/2015  . Chronic insomnia 05/07/2015  . Hypertension, benign 05/07/2015  . Dyslipidemia 05/07/2015  . Hyperglycemia 05/07/2015  . Chronic low back pain 05/07/2015  . IBS (irritable bowel syndrome) 05/07/2015  . Gastroesophageal reflux disease without esophagitis 05/07/2015  . Major depression, chronic ([place]) 05/07/2015  . Menopausal syndrome (hot flashes) 05/07/2015  . History of postmenopausal bleeding 01/14/2015    Past Surgical History:  Procedure Laterality Date  . BREAST BIOPSY Left  neg  . BREAST LUMPECTOMY    . CERVICAL DISCECTOMY     x 2  . CHOLECYSTECTOMY    . DILATATION & CURETTAGE/HYSTEROSCOPY WITH MYOSURE N/A 03/21/2015   Procedure: DILATATION & CURETTAGE/HYSTEROSCOPY;  Surgeon: Aletha Halim, MD;  Location: [facility] ORS;  Service: Gynecology;  Laterality: N/A;  . ENDOMETRIAL ABLATION    . SPINAL FUSION    . TONSILLECTOMY    . TUBAL LIGATION      Family History  Problem Relation Age of Onset  . Heart disease Mother   .  Thyroid cancer Mother   . Depression Daughter   . Asthma Daughter     Social History   Socioeconomic History  . Marital status: Married    Spouse name: Not on file  . Number of children: Not on file  . Years of education: Not on file  . Highest education level: Not on file  Social Needs  . Financial resource strain: Not on file  . Food insecurity - worry: Not on file  . Food insecurity - inability: Not on file  . Transportation needs - medical: Not on file  . Transportation needs - non-medical: Not on file  Occupational History  . Not on file  Tobacco Use  . Smoking status: Current Every Day Smoker    Packs/day: 0.25    Years: 15.00    Pack years: 3.75    Types: Cigarettes    Start date: 07/26/2002  . Smokeless tobacco: Never Used  Substance and Sexual Activity  . Alcohol use: No    Alcohol/week: 0.0 oz  . Drug use: No  . Sexual activity: Yes    Partners: Male    Birth control/protection: Other-see comments    Comment: Ablation  Other Topics Concern  . Not on file  Social History Narrative  . Not on file     Current Outpatient Medications:  .  acetaminophen (TYLENOL) 500 MG tablet, Take 1 tablet (500 mg total) by mouth every 6 (six) hours as needed., Disp: 30 tablet, Rfl: 2 .  amitriptyline (ELAVIL) 10 MG tablet, Take 1 tablet (10 mg total) by mouth at bedtime., Disp: 30 tablet, Rfl: 5 .  EVZIO 0.4 MG/0.4ML SOAJ, , Disp: , Rfl:  .  gabapentin (NEURONTIN) 300 MG capsule, Take 1 capsule by mouth every 12 (twelve) hours., Disp: , Rfl:  .  losartan-hydrochlorothiazide (HYZAAR) 100-25 MG tablet, Take 1 tablet by mouth daily., Disp: 30 tablet, Rfl: 5 .  meloxicam (MOBIC) 7.5 MG tablet, Take 1 tablet (7.5 mg total) by mouth daily., Disp: 30 tablet, Rfl: 5 .  metFORMIN (GLUCOPHAGE) 500 MG tablet, Take 1 tablet (500 mg total) by mouth daily with breakfast., Disp: 30 tablet, Rfl: 5 .  oxyCODONE (ROXICODONE) 15 MG immediate release tablet, Take 1 tablet by mouth every 4 (four)  hours., Disp: , Rfl:  .  PRISTIQ 50 MG 24 hr tablet, Take 1 tablet (50 mg total) by mouth daily., Disp: 30 tablet, Rfl: 5 .  QUEtiapine (SEROQUEL) 25 MG tablet, Take 1 tablet (25 mg total) by mouth at bedtime., Disp: 30 tablet, Rfl: 5 .  ranitidine (ZANTAC) 300 MG tablet, TAKE ONE TABLET BY MOUTH DAILY AS NEEDED HEARTBURN, Disp: 30 tablet, Rfl: 5 .  tiZANidine (ZANAFLEX) 4 MG tablet, Take 1 tablet by mouth every 12 (twelve) hours., Disp: , Rfl:  .  triamcinolone (KENALOG) 0.025 % cream, Apply topically 2 (two) times daily., Disp: 453.6 g, Rfl: 0 .  lidocaine (LIDODERM) 5 %, Place 1 patch onto  the skin daily. Remove & Discard patch within 12 hours or as directed by MD, Disp: 30 patch, Rfl: 0  No Known Allergies   ROS  Constitutional: Negative for fever or weight change.  Respiratory: Negative for cough and shortness of breath.   Cardiovascular: Negative for chest pain or palpitations.  Gastrointestinal: Negative for abdominal pain, no bowel changes.  Musculoskeletal: Negative for gait problem or joint swelling.  Skin: Negative for rash.  Neurological: Negative for dizziness or headache.  No other specific complaints in a complete review of systems (except as listed in HPI above).  Objective  Vitals:   10/21/17 0944  BP: (!) 98/56  Pulse: 81  Resp: 16  Temp: 98.1 F (36.7 C)  TempSrc: Oral  SpO2: 98%  Weight: 201 lb 6.4 oz (91.4 kg)  Height: 5\' 9"  (1.753 m)    Body mass index is 29.74 kg/m.  Physical Exam  Constitutional: Patient appears well-developed and well-nourished. Overweight  No distress.  HEENT: head atraumatic, normocephalic, pupils equal and reactive to light, neck supple, throat within normal limits Cardiovascular: Normal rate, regular rhythm and normal heart sounds.  No murmur heard. No BLE edema. Pulmonary/Chest: Effort normal and breath sounds normal. No respiratory distress. Abdominal: Soft.  There is no tenderness. Psychiatric: Patient has a normal mood  and affect. behavior is normal. Judgment and thought content normal. Muscular Skeletal: pain during palpation of right lower back, negative straight leg raise  Recent Results (from the past 2160 hour(s))  CBC with Differential/Platelet     Status: None   Collection Time: 07/26/17 11:54 AM  Result Value Ref Range   WBC 6.5 3.8 - 10.8 Thousand/uL   RBC 4.19 3.80 - 5.10 Million/uL   Hemoglobin 13.0 11.7 - 15.5 g/dL   HCT 38.9 35.0 - 45.0 %   MCV 92.8 80.0 - 100.0 fL   MCH 31.0 27.0 - 33.0 pg   MCHC 33.4 32.0 - 36.0 g/dL   RDW 12.2 11.0 - 15.0 %   Platelets 296 140 - 400 Thousand/uL   MPV 11.1 7.5 - 12.5 fL   Neutro Abs 3,738 1,500 - 7,800 cells/uL   Lymphs Abs 2,223 850 - 3,900 cells/uL   WBC mixed population 423 200 - 950 cells/uL   Eosinophils Absolute 98 15 - 500 cells/uL   Basophils Absolute 20 0 - 200 cells/uL   Neutrophils Relative % 57.5 %   Total Lymphocyte 34.2 %   Monocytes Relative 6.5 %   Eosinophils Relative 1.5 %   Basophils Relative 0.3 %  Hemoglobin A1c     Status: Abnormal   Collection Time: 07/26/17 11:54 AM  Result Value Ref Range   Hgb A1c MFr Bld 5.8 (H) <5.7 % of total Hgb    Comment: For someone without known diabetes, a hemoglobin  A1c value between 5.7% and 6.4% is consistent with prediabetes and should be confirmed with a  follow-up test. . For someone with known diabetes, a value <7% indicates that their diabetes is well controlled. A1c targets should be individualized based on duration of diabetes, age, comorbid conditions, and other considerations. . This assay result is consistent with an increased risk of diabetes. . Currently, no consensus exists regarding use of hemoglobin A1c for diagnosis of diabetes for children. .    Mean Plasma Glucose 120 (calc)   eAG (mmol/L) 6.6 (calc)  Lipid panel     Status: Abnormal   Collection Time: 07/26/17 11:54 AM  Result Value Ref Range   Cholesterol 149 <200  mg/dL   HDL 35 (L) >50 mg/dL    Triglycerides 266 (H) <150 mg/dL   LDL Cholesterol (Calc) 79 mg/dL (calc)    Comment: Reference range: <100 . Desirable range <100 mg/dL for primary prevention;   <70 mg/dL for patients with CHD or diabetic patients  with > or = 2 CHD risk factors. Marland Kitchen LDL-C is now calculated using the Martin-Hopkins  calculation, which is a validated novel method providing  better accuracy than the Friedewald equation in the  estimation of LDL-C.  Cresenciano Genre et al. Annamaria Helling. 8101;751(02): 2061-2068  (http://education.[facility].com/faq/FAQ164)    Total CHOL/HDL Ratio 4.3 <5.0 (calc)   Non-HDL Cholesterol (Calc) 114 <130 mg/dL (calc)    Comment: For patients with diabetes plus 1 major ASCVD risk  factor, treating to a non-HDL-C goal of <100 mg/dL  (LDL-C of <70 mg/dL) is considered a therapeutic  option.   COMPLETE METABOLIC PANEL WITH GFR     Status: None   Collection Time: 07/26/17 11:54 AM  Result Value Ref Range   Glucose, Bld 98 65 - 139 mg/dL    Comment: .        Non-fasting reference interval .    BUN 10 7 - 25 mg/dL   Creat 0.66 0.50 - 1.05 mg/dL    Comment: For patients >6 years of age, the reference limit for Creatinine is approximately 13% higher for people identified as African-American. .    GFR, Est Non African American 100 > OR = 60 mL/min/1.79m2   GFR, Est African American 116 > OR = 60 mL/min/1.7m2   BUN/Creatinine Ratio NOT APPLICABLE 6 - 22 (calc)   Sodium 140 135 - 146 mmol/L   Potassium 4.0 3.5 - 5.3 mmol/L   Chloride 104 98 - 110 mmol/L   CO2 25 20 - 32 mmol/L   Calcium 9.7 8.6 - 10.4 mg/dL   Total Protein 6.8 6.1 - 8.1 g/dL   Albumin 4.5 3.6 - 5.1 g/dL   Globulin 2.3 1.9 - 3.7 g/dL (calc)   AG Ratio 2.0 1.0 - 2.5 (calc)   Total Bilirubin 0.4 0.2 - 1.2 mg/dL   Alkaline phosphatase (APISO) 82 33 - 130 U/L   AST 20 10 - 35 U/L   ALT 22 6 - 29 U/L  C-reactive protein     Status: Abnormal   Collection Time: 07/26/17 11:54 AM  Result Value Ref Range   CRP 12.9  (H) <8.0 mg/L  Sedimentation rate     Status: None   Collection Time: 07/26/17 11:54 AM  Result Value Ref Range   Sed Rate 17 0 - 30 mm/h  Rheumatoid Factor     Status: None   Collection Time: 07/26/17 11:54 AM  Result Value Ref Range   Rhuematoid fact SerPl-aCnc <14 <14 IU/mL  Insulin, random     Status: Abnormal   Collection Time: 07/26/17 11:54 AM  Result Value Ref Range   Insulin 22.8 (H) 2.0 - 19.6 uIU/mL    Comment: This insulin assay shows strong cross-reactivity for some insulin analogs (lispro, aspart, and glargine) and much lower cross-reactivity with others (detemir, glulisine).   HIV antibody     Status: None   Collection Time: 07/26/17 11:59 AM  Result Value Ref Range   HIV 1&2 Ab, 4th Generation NON-REACTIVE NON-REACTI    Comment: HIV-1 antigen and HIV-1/HIV-2 antibodies were not detected. There is no laboratory evidence of HIV infection. Marland Kitchen PLEASE NOTE: This information has been disclosed to you from records whose confidentiality may be  protected by state law.  If your state requires such protection, then the state law prohibits you from making any further disclosure of the information without the specific written consent of the person to whom it pertains, or as otherwise permitted by law. A general authorization for the release of medical or other information is NOT sufficient for this purpose. . For additional information please refer to http://education.[facility].com/faq/FAQ106 (This link is being provided for informational/ educational purposes only.) . Marland Kitchen The performance of this assay has not been clinically validated in patients less than 97 years old. .   RPR     Status: None   Collection Time: 07/26/17 11:59 AM  Result Value Ref Range   RPR Ser Ql NON-REACTIVE NON-REACTI  Hepatitis, Acute     Status: None   Collection Time: 07/26/17 11:59 AM  Result Value Ref Range   Hep A IgM NON-REACTIVE NON-REACTI   Hepatitis B Surface Ag  NON-REACTIVE NON-REACTI   Hep B C IgM NON-REACTIVE NON-REACTI   Hepatitis C Ab NON-REACTIVE NON-REACTI   SIGNAL TO CUT-OFF 0.01 <1.00      PHQ2/9: Depression screen [facility] 2/9 10/21/2017 07/26/2017 06/02/2017 11/27/2016 08/26/2016  Decreased Interest 0 1 1 0 0  Down, Depressed, Hopeless 1 2 1  0 0  PHQ - 2 Score 1 3 2  0 0  Altered sleeping - 1 1 - -  Tired, decreased energy - 0 0 - -  Change in appetite - 3 1 - -  Feeling bad or failure about yourself  - 1 0 - -  Trouble concentrating - 0 0 - -  Moving slowly or fidgety/restless - 0 0 - -  Suicidal thoughts - 0 0 - -  PHQ-9 Score - 8 4 - -  Difficult doing work/chores - Not difficult at all - - -     Fall Risk: Fall Risk  10/21/2017 07/26/2017 11/27/2016 08/26/2016 05/29/2016  Falls in the past year? No No No Yes No  Number falls in past yr: - - - 1 -  Injury with Fall? - - - No -     Functional Status Survey: Is the patient deaf or have difficulty hearing?: No Does the patient have difficulty seeing, even when wearing glasses/contacts?: No Does the patient have difficulty concentrating, remembering, or making decisions?: No Does the patient have difficulty walking or climbing stairs?: No Does the patient have difficulty dressing or bathing?: No Does the patient have difficulty doing errands alone such as visiting a doctor's office or shopping?: No    Assessment & Plan  1. Carpal tunnel syndrome on both sides  - lidocaine (LIDODERM) 5 %; Place 1 patch onto the skin daily. Remove & Discard patch within 12 hours or as directed by MD  Dispense: 30 patch; Refill: 0  2. Chronic pain disorder  - lidocaine (LIDODERM) 5 %; Place 1 patch onto the skin daily. Remove & Discard patch within 12 hours or as directed by MD  Dispense: 30 patch; Refill: 0  3. Irritable bowel syndrome with both constipation and diarrhea  - amitriptyline (ELAVIL) 10 MG tablet; Take 1 tablet (10 mg total) by mouth at bedtime.  Dispense: 30 tablet; Refill:  5  4. Primary osteoarthritis of both knees  - meloxicam (MOBIC) 7.5 MG tablet; Take 1 tablet (7.5 mg total) by mouth daily.  Dispense: 30 tablet; Refill: 5  5. Insulin resistance  - metFORMIN (GLUCOPHAGE) 500 MG tablet; Take 1 tablet (500 mg total) by mouth daily with breakfast.  Dispense: 30  tablet; Refill: 5  6. Metabolic syndrome  - metFORMIN (GLUCOPHAGE) 500 MG tablet; Take 1 tablet (500 mg total) by mouth daily with breakfast.  Dispense: 30 tablet; Refill: 5  7. Major depression, chronic  - PRISTIQ 50 MG 24 hr tablet; Take 1 tablet (50 mg total) by mouth daily.  Dispense: 30 tablet; Refill: 5 - QUEtiapine (SEROQUEL) 25 MG tablet; Take 1 tablet (25 mg total) by mouth at bedtime.  Dispense: 30 tablet; Refill: 5  8. Chronic insomnia  - QUEtiapine (SEROQUEL) 25 MG tablet; Take 1 tablet (25 mg total) by mouth at bedtime.  Dispense: 30 tablet; Refill: 5  9. GERD without esophagitis  - ranitidine (ZANTAC) 300 MG tablet; TAKE ONE TABLET BY MOUTH DAILY AS NEEDED HEARTBURN  Dispense: 30 tablet; Refill: 5

## 2017-10-22 ENCOUNTER — Telehealth: Payer: Self-pay

## 2017-10-22 NOTE — Telephone Encounter (Signed)
Prior Authorization was submitted to Medicare for Lidoderm patch. Medication was denied. Patient is evaluated at pain clinic. [employer] faxed appeal forms if you want to appeal this.

## 2017-11-08 DIAGNOSIS — M25552 Pain in left hip: Secondary | ICD-10-CM | POA: Diagnosis not present

## 2017-11-08 DIAGNOSIS — M25512 Pain in left shoulder: Secondary | ICD-10-CM | POA: Diagnosis not present

## 2017-11-08 DIAGNOSIS — M25511 Pain in right shoulder: Secondary | ICD-10-CM | POA: Diagnosis not present

## 2017-11-08 DIAGNOSIS — M25551 Pain in right hip: Secondary | ICD-10-CM | POA: Diagnosis not present

## 2017-11-08 DIAGNOSIS — M25562 Pain in left knee: Secondary | ICD-10-CM | POA: Diagnosis not present

## 2017-11-08 DIAGNOSIS — G894 Chronic pain syndrome: Secondary | ICD-10-CM | POA: Diagnosis not present

## 2017-11-08 DIAGNOSIS — M25561 Pain in right knee: Secondary | ICD-10-CM | POA: Diagnosis not present

## 2017-11-08 DIAGNOSIS — M542 Cervicalgia: Secondary | ICD-10-CM | POA: Diagnosis not present

## 2017-11-08 DIAGNOSIS — M545 Low back pain: Secondary | ICD-10-CM | POA: Diagnosis not present

## 2017-11-15 DIAGNOSIS — R202 Paresthesia of skin: Secondary | ICD-10-CM | POA: Diagnosis not present

## 2017-11-15 DIAGNOSIS — M545 Low back pain: Secondary | ICD-10-CM | POA: Diagnosis not present

## 2017-11-15 DIAGNOSIS — M542 Cervicalgia: Secondary | ICD-10-CM | POA: Diagnosis not present

## 2017-11-15 DIAGNOSIS — G890 Central pain syndrome: Secondary | ICD-10-CM | POA: Diagnosis not present

## 2017-11-15 DIAGNOSIS — M5412 Radiculopathy, cervical region: Secondary | ICD-10-CM | POA: Diagnosis not present

## 2017-11-22 DIAGNOSIS — G5603 Carpal tunnel syndrome, bilateral upper limbs: Secondary | ICD-10-CM | POA: Diagnosis not present

## 2017-11-22 DIAGNOSIS — M170 Bilateral primary osteoarthritis of knee: Secondary | ICD-10-CM | POA: Diagnosis not present

## 2017-12-09 DIAGNOSIS — G894 Chronic pain syndrome: Secondary | ICD-10-CM | POA: Diagnosis not present

## 2017-12-09 DIAGNOSIS — M25511 Pain in right shoulder: Secondary | ICD-10-CM | POA: Diagnosis not present

## 2017-12-09 DIAGNOSIS — M25551 Pain in right hip: Secondary | ICD-10-CM | POA: Diagnosis not present

## 2017-12-09 DIAGNOSIS — M25562 Pain in left knee: Secondary | ICD-10-CM | POA: Diagnosis not present

## 2017-12-09 DIAGNOSIS — M25561 Pain in right knee: Secondary | ICD-10-CM | POA: Diagnosis not present

## 2017-12-09 DIAGNOSIS — M25552 Pain in left hip: Secondary | ICD-10-CM | POA: Diagnosis not present

## 2017-12-09 DIAGNOSIS — M25512 Pain in left shoulder: Secondary | ICD-10-CM | POA: Diagnosis not present

## 2017-12-09 DIAGNOSIS — M545 Low back pain: Secondary | ICD-10-CM | POA: Diagnosis not present

## 2017-12-13 ENCOUNTER — Telehealth: Payer: Self-pay | Admitting: Family Medicine

## 2017-12-13 DIAGNOSIS — H04123 Dry eye syndrome of bilateral lacrimal glands: Secondary | ICD-10-CM | POA: Diagnosis not present

## 2017-12-13 NOTE — Telephone Encounter (Signed)
Copied from [place] [phone]. Topic: Referral - Request >> Dec 13, 2017  4:11 PM Robina Ade, Helene Kelp D wrote: Reason for CRM: Patient called and said that she needs a  referral to see a surgent per her Rheumatology doctor. They told her she needs to talk to her PCP or CMA about this. Please call patient back.

## 2017-12-14 NOTE — Telephone Encounter (Signed)
Appt resolved

## 2017-12-15 NOTE — Telephone Encounter (Signed)
Called patient in regards to needing a referral and speaking with PCP or CMA. I did not get an answer. I left a vm for patient to call back .

## 2017-12-16 ENCOUNTER — Other Ambulatory Visit: Payer: Self-pay | Admitting: Family Medicine

## 2017-12-16 ENCOUNTER — Telehealth: Payer: Self-pay

## 2017-12-16 DIAGNOSIS — G5603 Carpal tunnel syndrome, bilateral upper limbs: Secondary | ICD-10-CM

## 2017-12-16 DIAGNOSIS — M170 Bilateral primary osteoarthritis of knee: Secondary | ICD-10-CM

## 2017-12-16 NOTE — Telephone Encounter (Signed)
Patient is calling in regards to her bilateral carpal tunnel in her hands and bilateral osteoarthritis in her knees. She reports that you suggested that she be evaluated by a surgeon. She does not want to have surgery, but she does want the injections. She would like a referral to have injections done. If injections are done by a surgeon, she does not want to be evaluated by Dr. Rudene Christians.

## 2017-12-16 NOTE — Telephone Encounter (Signed)
Patient is calling in regards to her carpal tunnel in both hands, severe carpal tunnel in left hand and osteoarthritis in both of her knees. She reports that you suggested that she be evaluated by a surgeon. She does not want to do surgery, but does want to have the steroid injection. She would like a referral to see a surgeon if that is who does the injections. She does not want to be evaluated by Dr. Rudene Christians.

## 2018-01-07 DIAGNOSIS — M25562 Pain in left knee: Secondary | ICD-10-CM | POA: Diagnosis not present

## 2018-01-07 DIAGNOSIS — M79661 Pain in right lower leg: Secondary | ICD-10-CM | POA: Diagnosis not present

## 2018-01-07 DIAGNOSIS — G894 Chronic pain syndrome: Secondary | ICD-10-CM | POA: Diagnosis not present

## 2018-01-07 DIAGNOSIS — M545 Low back pain: Secondary | ICD-10-CM | POA: Diagnosis not present

## 2018-01-07 DIAGNOSIS — M542 Cervicalgia: Secondary | ICD-10-CM | POA: Diagnosis not present

## 2018-01-07 DIAGNOSIS — M79662 Pain in left lower leg: Secondary | ICD-10-CM | POA: Diagnosis not present

## 2018-01-07 DIAGNOSIS — M25511 Pain in right shoulder: Secondary | ICD-10-CM | POA: Diagnosis not present

## 2018-01-07 DIAGNOSIS — M25552 Pain in left hip: Secondary | ICD-10-CM | POA: Diagnosis not present

## 2018-01-07 DIAGNOSIS — M25561 Pain in right knee: Secondary | ICD-10-CM | POA: Diagnosis not present

## 2018-01-07 DIAGNOSIS — M25551 Pain in right hip: Secondary | ICD-10-CM | POA: Diagnosis not present

## 2018-01-07 DIAGNOSIS — M25512 Pain in left shoulder: Secondary | ICD-10-CM | POA: Diagnosis not present

## 2018-01-12 ENCOUNTER — Telehealth: Payer: Self-pay | Admitting: Family Medicine

## 2018-01-12 ENCOUNTER — Other Ambulatory Visit: Payer: Self-pay | Admitting: Family Medicine

## 2018-01-12 MED ORDER — OSELTAMIVIR PHOSPHATE 75 MG PO CAPS: 75.0000 mg | ORAL_CAPSULE | Freq: Every day | ORAL | 0 refills | Status: DC

## 2018-01-12 NOTE — Telephone Encounter (Signed)
Copied from [place] [id number]. Topic: Quick Communication - See Telephone Encounter >> Jan 12, 2018  8:22 AM Lolita Rieger, RMA wrote: CRM for notification. See Telephone encounter for:   01/12/18.pt called and stated that her husband is in the hospital with flu and pneumonia and pt would like preventive meds sent to pharmacy [facility] Pt contact # [phone]

## 2018-01-12 NOTE — Telephone Encounter (Signed)
Sent tamiflu

## 2018-01-12 NOTE — Telephone Encounter (Signed)
Patient informed. 

## 2018-01-12 NOTE — Telephone Encounter (Signed)
Copied from [place] [phone]. Topic: Quick Communication - See Telephone Encounter >> Jan 12, 2018  8:22 AM Lolita Rieger, RMA wrote: CRM for notification. See Telephone encounter for:   01/12/18.pt called and stated that her husband is in the hospital with flu and pneumonia and pt would like preventive meds sent to pharmacy [facility] Pt contact # [phone]

## 2018-01-19 ENCOUNTER — Ambulatory Visit (INDEPENDENT_AMBULATORY_CARE_PROVIDER_SITE_OTHER): Payer: Medicare Other | Admitting: Family Medicine

## 2018-01-19 ENCOUNTER — Encounter: Payer: Self-pay | Admitting: Family Medicine

## 2018-01-19 VITALS — BP 136/74 | HR 108 | Temp 98.3°F | Resp 16 | Ht 69.0 in | Wt 194.0 lb

## 2018-01-19 DIAGNOSIS — R700 Elevated erythrocyte sedimentation rate: Secondary | ICD-10-CM

## 2018-01-19 DIAGNOSIS — E8881 Metabolic syndrome: Secondary | ICD-10-CM

## 2018-01-19 DIAGNOSIS — F5104 Psychophysiologic insomnia: Secondary | ICD-10-CM | POA: Diagnosis not present

## 2018-01-19 DIAGNOSIS — I10 Essential (primary) hypertension: Secondary | ICD-10-CM

## 2018-01-19 DIAGNOSIS — R739 Hyperglycemia, unspecified: Secondary | ICD-10-CM | POA: Diagnosis not present

## 2018-01-19 DIAGNOSIS — G894 Chronic pain syndrome: Secondary | ICD-10-CM | POA: Diagnosis not present

## 2018-01-19 DIAGNOSIS — F330 Major depressive disorder, recurrent, mild: Secondary | ICD-10-CM

## 2018-01-19 DIAGNOSIS — J441 Chronic obstructive pulmonary disease with (acute) exacerbation: Secondary | ICD-10-CM | POA: Diagnosis not present

## 2018-01-19 DIAGNOSIS — E785 Hyperlipidemia, unspecified: Secondary | ICD-10-CM

## 2018-01-19 DIAGNOSIS — K219 Gastro-esophageal reflux disease without esophagitis: Secondary | ICD-10-CM

## 2018-01-19 DIAGNOSIS — K582 Mixed irritable bowel syndrome: Secondary | ICD-10-CM | POA: Diagnosis not present

## 2018-01-19 DIAGNOSIS — M170 Bilateral primary osteoarthritis of knee: Secondary | ICD-10-CM

## 2018-01-19 MED ORDER — BENZONATATE 100 MG PO CAPS: 100.0000 mg | ORAL_CAPSULE | Freq: Three times a day (TID) | ORAL | 0 refills | Status: DC | PRN

## 2018-01-19 MED ORDER — LOSARTAN POTASSIUM-HCTZ 100-25 MG PO TABS: 1.0000 | ORAL_TABLET | Freq: Every day | ORAL | 2 refills | Status: DC

## 2018-01-19 MED ORDER — FLUTICASONE FUROATE-VILANTEROL 100-25 MCG/INH IN AEPB: 1.0000 | INHALATION_SPRAY | Freq: Every day | RESPIRATORY_TRACT | 0 refills | Status: DC

## 2018-01-19 MED ORDER — AMITRIPTYLINE HCL 10 MG PO TABS: 10.0000 mg | ORAL_TABLET | Freq: Every day | ORAL | 2 refills | Status: DC

## 2018-01-19 NOTE — Progress Notes (Signed)
Name: [name]   MRN: [id number]    DOB: December 16, 1960   Date:01/19/2018       Progress Note  Subjective  Chief Complaint  Chief Complaint  Patient presents with  . Medication Refill    3 month F/U  . COPD    Dry cough but trying to get mucus up-worst at night. Feels like she has a knot in her left upper abdominal area-that hurts when she swallows drinking something  . Hypertension    Denies any symtpoms  . Depression  . Back Pain    Unchanged    HPI  Carpal tunnel:  She was seen by Dr Barb Merino, and referred to neurologist, diagnosed with carpal tunnel syndrome and is wearing a brace, she has noticed some improvement of burning and numbness sensation of both hands, on gabapentin but refuses to have surgery    Metabolic syndrome: Lost 13 lbs past 6 months. No longer has diarrhea from Metformin, but only taking one pill daily. She denies polyphagia, polydipsia or polyuria. Discussed GLP-1 agonist, but she states her mother had a history of thyroid cancer. Continue life style modification. Discussed low HDL. She is doing well   HTN:bp is at goal today, no dizziness. No chest pain or shortness of breath; some foot swelling occasionally that goes down when she props her feet up at night.   Bilateral Knee Osteoarthritis: Saw Dr. Rudene Christians on 11/25/2016 and was told to follow up only as needed. Was prescribed Meloxicam and says it helps with symptoms, only taking medication prn.   Depression Major:Mother passed away in 12-28-16 - had CHF, pacemaker/defib and died of an MI - pt was an only child;youngest daughter is back home from [place] for the next few weeks;middle daughter got married in 07/2016; oldest daughter doing well.She is taking medication, she does not want referral to Psychiatrist. She is feeling better now than back in December.   COPD:She is still smoking5 cigarettes a dayand she is not ready to quit at this tim, but discussed importance of quitting, she  was exposed to the flu and took Tamiflu but has noticed a nocturnal cough for the past two weeks, no wheezing or SOB. She stopped all inhalers.   Chronic back pain: she goes to [facility] in [place] and the pain has been stable at around 7/10 on her back, but can get higher. Pain management prescribe Evzio, gabapentin, oxycodone, and tizanidine.Symptoms stable  Elevated c-reactive protein: follow up with Dr. Barb Merino, she was diagnosed with OA and carpal tunnel    Patient Active Problem List   Diagnosis Date Noted  . Carpal tunnel syndrome on both sides 10/21/2017  . CRP elevated 08/09/2017  . Elevated C-reactive protein 07/28/2017  . Osteoarthritis of both knees 02/19/2016  . COPD, mild ([place]) 05/08/2015  . Vitamin D deficiency 05/07/2015  . Metabolic syndrome 93/79/0240  . Eczema 05/07/2015  . Chronic insomnia 05/07/2015  . Hypertension, benign 05/07/2015  . Dyslipidemia 05/07/2015  . Hyperglycemia 05/07/2015  . Chronic low back pain 05/07/2015  . IBS (irritable bowel syndrome) 05/07/2015  . Gastroesophageal reflux disease without esophagitis 05/07/2015  . Major depression, chronic ([place]) 05/07/2015  . Menopausal syndrome (hot flashes) 05/07/2015  . History of postmenopausal bleeding 01/14/2015    Past Surgical History:  Procedure Laterality Date  . BREAST BIOPSY Left    neg  . BREAST LUMPECTOMY    . CERVICAL DISCECTOMY     x 2  . CHOLECYSTECTOMY    . DILATATION &  CURETTAGE/HYSTEROSCOPY WITH MYOSURE N/A 03/21/2015   Procedure: DILATATION & CURETTAGE/HYSTEROSCOPY;  Surgeon: Aletha Halim, MD;  Location: [facility] ORS;  Service: Gynecology;  Laterality: N/A;  . ENDOMETRIAL ABLATION    . SPINAL FUSION    . TONSILLECTOMY    . TUBAL LIGATION      Family History  Problem Relation Age of Onset  . Heart disease Mother   . Thyroid cancer Mother   . Depression Daughter   . Asthma Daughter     Social History   Socioeconomic History  . Marital status: Married     Spouse name: Not on file  . Number of children: 3  . Years of education: Not on file  . Highest education level: Not on file  Social Needs  . Financial resource strain: Not on file  . Food insecurity - worry: Not on file  . Food insecurity - inability: Not on file  . Transportation needs - medical: Not on file  . Transportation needs - non-medical: Not on file  Occupational History  . Occupation: disable     Comment: chronic back pain   Tobacco Use  . Smoking status: Current Every Day Smoker    Packs/day: 0.25    Years: 15.00    Pack years: 3.75    Types: Cigarettes    Start date: 07/26/2002  . Smokeless tobacco: Never Used  Substance and Sexual Activity  . Alcohol use: No    Alcohol/week: 0.0 oz  . Drug use: No  . Sexual activity: Yes    Partners: Male    Birth control/protection: Other-see comments    Comment: Ablation  Other Topics Concern  . Not on file  Social History Narrative  . Not on file     Current Outpatient Medications:  .  acetaminophen (TYLENOL) 500 MG tablet, Take 1 tablet (500 mg total) by mouth every 6 (six) hours as needed., Disp: 30 tablet, Rfl: 2 .  amitriptyline (ELAVIL) 10 MG tablet, Take 1 tablet (10 mg total) by mouth at bedtime., Disp: 30 tablet, Rfl: 2 .  clindamycin (CLEOCIN) 300 MG capsule, TAKE ONE CAPSULE BY MOUTH EVERY 6 HOURS UNTIL GONE, Disp: , Rfl: 0 .  EVZIO 0.4 MG/0.4ML SOAJ, , Disp: , Rfl:  .  gabapentin (NEURONTIN) 300 MG capsule, Take 1 capsule by mouth every 12 (twelve) hours., Disp: , Rfl:  .  lidocaine (LIDODERM) 5 %, Place 1 patch onto the skin daily. Remove & Discard patch within 12 hours or as directed by MD, Disp: 30 patch, Rfl: 0 .  losartan-hydrochlorothiazide (HYZAAR) 100-25 MG tablet, Take 1 tablet by mouth daily., Disp: 30 tablet, Rfl: 2 .  meloxicam (MOBIC) 7.5 MG tablet, Take 1 tablet (7.5 mg total) by mouth daily., Disp: 30 tablet, Rfl: 5 .  metFORMIN (GLUCOPHAGE) 500 MG tablet, Take 1 tablet (500 mg total) by mouth  daily with breakfast., Disp: 30 tablet, Rfl: 5 .  oxyCODONE (ROXICODONE) 15 MG immediate release tablet, Take 1 tablet by mouth every 4 (four) hours., Disp: , Rfl:  .  PRISTIQ 50 MG 24 hr tablet, Take 1 tablet (50 mg total) by mouth daily., Disp: 30 tablet, Rfl: 5 .  QUEtiapine (SEROQUEL) 25 MG tablet, Take 1 tablet (25 mg total) by mouth at bedtime., Disp: 30 tablet, Rfl: 5 .  ranitidine (ZANTAC) 300 MG tablet, TAKE ONE TABLET BY MOUTH DAILY AS NEEDED HEARTBURN, Disp: 30 tablet, Rfl: 5 .  tiZANidine (ZANAFLEX) 4 MG tablet, Take 1 tablet by mouth every 12 (twelve) hours., Disp: ,  Rfl:  .  triamcinolone (KENALOG) 0.025 % cream, Apply topically 2 (two) times daily., Disp: 453.6 g, Rfl: 0 .  benzonatate (TESSALON) 100 MG capsule, Take 1-2 capsules (100-200 mg total) by mouth 3 (three) times daily as needed., Disp: 40 capsule, Rfl: 0 .  fluticasone furoate-vilanterol (BREO ELLIPTA) 100-25 MCG/INH AEPB, Inhale 1 puff into the lungs daily., Disp: 60 each, Rfl: 0  No Known Allergies   ROS  Constitutional: Negative for fever , positive for  weight change.  Respiratory: Positive  for cough but no shortness of breath.   Cardiovascular: Negative for chest pain or palpitations.  Gastrointestinal: Negative for abdominal pain, no bowel changes.  Musculoskeletal: Positive  for gait problem secondary to back and knee pain and intermittent joint swelling.  Skin: Negative for rash.  Neurological: Negative for dizziness or headache.  No other specific complaints in a complete review of systems (except as listed in HPI above).  Objective  Vitals:   01/19/18 0937  BP: 136/74  Pulse: (!) 108  Resp: 16  Temp: 98.3 F (36.8 C)  TempSrc: Oral  SpO2: 97%  Weight: 194 lb (88 kg)  Height: 5\' 9"  (1.753 m)    Body mass index is 28.65 kg/m.  Physical Exam  Constitutional: Patient appears well-developed and well-nourished. Overweight. No distress.  HEENT: head atraumatic, normocephalic, pupils equal and  reactive to light, neck supple, throat within normal limits Cardiovascular: Normal rate, regular rhythm and normal heart sounds.  No murmur heard. No BLE edema. Pulmonary/Chest: Effort normal and breath sounds normal. No respiratory distress. Abdominal: Soft.  There is no tenderness. Psychiatric: Patient has a normal mood and affect. behavior is normal. Judgment and thought content normal. Muscular Skeletal: tender during palpation of lumbar spine, negative straight leg raise.   PHQ2/9: Depression screen [facility] 2/9 01/19/2018 10/21/2017 07/26/2017 06/02/2017 11/27/2016  Decreased Interest 0 0 1 1 0  Down, Depressed, Hopeless 0 1 2 1  0  PHQ - 2 Score 0 1 3 2  0  Altered sleeping 1 - 1 1 -  Tired, decreased energy 1 - 0 0 -  Change in appetite 0 - 3 1 -  Feeling bad or failure about yourself  0 - 1 0 -  Trouble concentrating 0 - 0 0 -  Moving slowly or fidgety/restless 0 - 0 0 -  Suicidal thoughts 0 - 0 0 -  PHQ-9 Score 2 - 8 4 -  Difficult doing work/chores Not difficult at all - Not difficult at all - -     Fall Risk: Fall Risk  01/19/2018 10/21/2017 07/26/2017 11/27/2016 08/26/2016  Falls in the past year? No No No No Yes  Number falls in past yr: - - - - 1  Injury with Fall? - - - - No     Functional Status Survey: Is the patient deaf or have difficulty hearing?: No Does the patient have difficulty seeing, even when wearing glasses/contacts?: No Does the patient have difficulty concentrating, remembering, or making decisions?: No Does the patient have difficulty walking or climbing stairs?: No Does the patient have difficulty dressing or bathing?: No Does the patient have difficulty doing errands alone such as visiting a doctor's office or shopping?: No    Assessment & Plan  1. COPD exacerbation (HCC)  - fluticasone furoate-vilanterol (BREO ELLIPTA) 100-25 MCG/INH AEPB; Inhale 1 puff into the lungs daily.  Dispense: 60 each; Refill: 0 - benzonatate (TESSALON) 100 MG capsule; Take  1-2 capsules (100-200 mg total) by mouth 3 (three) times  daily as needed.  Dispense: 40 capsule; Refill: 0  2. Irritable bowel syndrome with both constipation and diarrhea  - amitriptyline (ELAVIL) 10 MG tablet; Take 1 tablet (10 mg total) by mouth at bedtime.  Dispense: 30 tablet; Refill: 2  3. Mild episode of recurrent major depressive disorder ([place])  Still not in remission, but refuses to go see psychiatrist or change medication at this time  4. Hypertension, benign  - losartan-hydrochlorothiazide (HYZAAR) 100-25 MG tablet; Take 1 tablet by mouth daily.  Dispense: 30 tablet; Refill: 2 - COMPLETE METABOLIC PANEL WITH GFR  5. Chronic pain disorder  Continue follow up with pain clinic   6. Chronic insomnia  Stable  7. Primary osteoarthritis of both knees  Daily pain, but does not want steroid injections or surgery at time  8. Metabolic syndrome  She has changed her diet and has lost weight, recheck labs.   9. GERD without esophagitis    10. Dyslipidemia  - Lipid panel  11. Elevated sed rate  - Sedimentation rate - C-reactive protein  12. Hyperglycemia  - Hemoglobin A1c

## 2018-01-20 LAB — LIPID PANEL
Cholesterol: 186 mg/dL (ref <200)
HDL: 43 mg/dL — AB (ref >50)
LDL Cholesterol (Calc): 112 mg/dL (calc) — ABNORMAL HIGH
Non-HDL Cholesterol (Calc): 143 mg/dL (calc) — ABNORMAL HIGH (ref <130)
TRIGLYCERIDES: 190 mg/dL — AB (ref <150)
Total CHOL/HDL Ratio: 4.3 (calc) (ref <5.0)

## 2018-01-20 LAB — COMPLETE METABOLIC PANEL WITH GFR
AG Ratio: 1.5 (calc) (ref 1.0–2.5)
ALBUMIN MSPROF: 4.9 g/dL (ref 3.6–5.1)
ALT: 30 U/L — ABNORMAL HIGH (ref 6–29)
AST: 22 U/L (ref 10–35)
Alkaline phosphatase (APISO): 116 U/L (ref 33–130)
BILIRUBIN TOTAL: 0.5 mg/dL (ref 0.2–1.2)
BUN: 7 mg/dL (ref 7–25)
CHLORIDE: 100 mmol/L (ref 98–110)
CO2: 30 mmol/L (ref 20–32)
Calcium: 10.4 mg/dL (ref 8.6–10.4)
Creat: 0.66 mg/dL (ref 0.50–1.05)
GFR, EST AFRICAN AMERICAN: 115 mL/min/{1.73_m2} (ref >=60)
GFR, Est Non African American: 99 mL/min/{1.73_m2} (ref >=60)
Globulin: 3.2 g/dL (calc) (ref 1.9–3.7)
Glucose, Bld: 102 mg/dL — ABNORMAL HIGH (ref 65–99)
Potassium: 4 mmol/L (ref 3.5–5.3)
Sodium: 139 mmol/L (ref 135–146)
TOTAL PROTEIN: 8.1 g/dL (ref 6.1–8.1)

## 2018-01-20 LAB — C-REACTIVE PROTEIN: CRP: 11.2 mg/L — AB (ref <8.0)

## 2018-01-20 LAB — SEDIMENTATION RATE: Sed Rate: 34 mm/h — ABNORMAL HIGH (ref 0–30)

## 2018-01-20 LAB — HEMOGLOBIN A1C
EAG (MMOL/L): 6.6 (calc)
Hgb A1c MFr Bld: 5.8 % of total Hgb — ABNORMAL HIGH (ref <5.7)
MEAN PLASMA GLUCOSE: 120 (calc)

## 2018-01-21 ENCOUNTER — Telehealth: Payer: Self-pay | Admitting: Family Medicine

## 2018-01-21 DIAGNOSIS — G5603 Carpal tunnel syndrome, bilateral upper limbs: Secondary | ICD-10-CM

## 2018-01-21 DIAGNOSIS — M170 Bilateral primary osteoarthritis of knee: Secondary | ICD-10-CM

## 2018-01-21 NOTE — Telephone Encounter (Signed)
Sent march 20th

## 2018-01-21 NOTE — Telephone Encounter (Signed)
Copied from [place] [id number]. Topic: Quick Communication - Rx Refill/Question >> Jan 21, 2018 11:58 AM Arletha Grippe wrote: Medication: benzonatate (TESSALON) 100 MG capsu Has the patient contacted their pharmacy? Yes.   (Agent: If no, request that the patient contact the pharmacy for the refill.) Preferred Pharmacy (with phone number or street name): [facility]  Agent: Please be advised that RX refills may take up to 3 business days. We ask that you follow-up with your pharmacy. This medication is nit covered by insurance - pt would like something else called in

## 2018-01-21 NOTE — Telephone Encounter (Signed)
Dr. Ancil Boozer verbalized she is unable to send anything else for cough. She can try Coricidin or Delsym otc.

## 2018-02-16 NOTE — Telephone Encounter (Signed)
Copied from [place] [id number]. Topic: Referral - Request >> Feb 16, 2018 10:50 AM [name], NT wrote: Reason for CRM: patient is calling and states she has carpal tunnel in both hands and ostearthritis in both knees. She states she would like a referral to a specialist. She states she thinks she will need to see a surgeon even though she does not want to have surgery. Please advise.

## 2018-02-25 DIAGNOSIS — M170 Bilateral primary osteoarthritis of knee: Secondary | ICD-10-CM | POA: Diagnosis not present

## 2018-02-25 DIAGNOSIS — M65311 Trigger thumb, right thumb: Secondary | ICD-10-CM | POA: Diagnosis not present

## 2018-02-25 DIAGNOSIS — G5603 Carpal tunnel syndrome, bilateral upper limbs: Secondary | ICD-10-CM | POA: Diagnosis not present

## 2018-03-09 DIAGNOSIS — M25551 Pain in right hip: Secondary | ICD-10-CM | POA: Diagnosis not present

## 2018-03-09 DIAGNOSIS — M545 Low back pain: Secondary | ICD-10-CM | POA: Diagnosis not present

## 2018-03-09 DIAGNOSIS — M25562 Pain in left knee: Secondary | ICD-10-CM | POA: Diagnosis not present

## 2018-03-09 DIAGNOSIS — M25512 Pain in left shoulder: Secondary | ICD-10-CM | POA: Diagnosis not present

## 2018-03-09 DIAGNOSIS — G894 Chronic pain syndrome: Secondary | ICD-10-CM | POA: Diagnosis not present

## 2018-03-09 DIAGNOSIS — M25561 Pain in right knee: Secondary | ICD-10-CM | POA: Diagnosis not present

## 2018-03-09 DIAGNOSIS — M25511 Pain in right shoulder: Secondary | ICD-10-CM | POA: Diagnosis not present

## 2018-03-09 DIAGNOSIS — M25552 Pain in left hip: Secondary | ICD-10-CM | POA: Diagnosis not present

## 2018-03-17 DIAGNOSIS — Z113 Encounter for screening for infections with a predominantly sexual mode of transmission: Secondary | ICD-10-CM | POA: Diagnosis not present

## 2018-03-17 DIAGNOSIS — B9689 Other specified bacterial agents as the cause of diseases classified elsewhere: Secondary | ICD-10-CM | POA: Diagnosis not present

## 2018-03-17 DIAGNOSIS — Z114 Encounter for screening for human immunodeficiency virus [HIV]: Secondary | ICD-10-CM | POA: Diagnosis not present

## 2018-03-17 DIAGNOSIS — L739 Follicular disorder, unspecified: Secondary | ICD-10-CM | POA: Diagnosis not present

## 2018-03-17 DIAGNOSIS — R8290 Unspecified abnormal findings in urine: Secondary | ICD-10-CM | POA: Diagnosis not present

## 2018-03-17 DIAGNOSIS — N760 Acute vaginitis: Secondary | ICD-10-CM | POA: Diagnosis not present

## 2018-03-29 DIAGNOSIS — K2960 Other gastritis without bleeding: Secondary | ICD-10-CM | POA: Diagnosis not present

## 2018-03-29 DIAGNOSIS — J449 Chronic obstructive pulmonary disease, unspecified: Secondary | ICD-10-CM | POA: Insufficient documentation

## 2018-03-29 DIAGNOSIS — F17210 Nicotine dependence, cigarettes, uncomplicated: Secondary | ICD-10-CM | POA: Diagnosis not present

## 2018-03-29 DIAGNOSIS — K2900 Acute gastritis without bleeding: Secondary | ICD-10-CM | POA: Insufficient documentation

## 2018-03-29 DIAGNOSIS — I10 Essential (primary) hypertension: Secondary | ICD-10-CM | POA: Diagnosis not present

## 2018-03-29 DIAGNOSIS — K8689 Other specified diseases of pancreas: Secondary | ICD-10-CM | POA: Diagnosis not present

## 2018-03-29 DIAGNOSIS — Z79899 Other long term (current) drug therapy: Secondary | ICD-10-CM | POA: Diagnosis not present

## 2018-03-29 DIAGNOSIS — K5730 Diverticulosis of large intestine without perforation or abscess without bleeding: Secondary | ICD-10-CM | POA: Diagnosis not present

## 2018-03-29 DIAGNOSIS — Z7984 Long term (current) use of oral hypoglycemic drugs: Secondary | ICD-10-CM | POA: Insufficient documentation

## 2018-03-29 DIAGNOSIS — F329 Major depressive disorder, single episode, unspecified: Secondary | ICD-10-CM | POA: Diagnosis not present

## 2018-03-29 DIAGNOSIS — Z9049 Acquired absence of other specified parts of digestive tract: Secondary | ICD-10-CM | POA: Insufficient documentation

## 2018-03-29 DIAGNOSIS — R1012 Left upper quadrant pain: Secondary | ICD-10-CM | POA: Diagnosis not present

## 2018-03-29 LAB — CBC
HCT: 37.8 % (ref 35.0–47.0)
Hemoglobin: 13.1 g/dL (ref 12.0–16.0)
MCH: 32.2 pg (ref 26.0–34.0)
MCHC: 34.5 g/dL (ref 32.0–36.0)
MCV: 93.3 fL (ref 80.0–100.0)
Platelets: 261 10*3/uL (ref 150–440)
RBC: 4.05 MIL/uL (ref 3.80–5.20)
RDW: 12.5 % (ref 11.5–14.5)
WBC: 9.2 10*3/uL (ref 3.6–11.0)

## 2018-03-29 LAB — BASIC METABOLIC PANEL
Anion gap: 11 (ref 5–15)
BUN: 13 mg/dL (ref 6–20)
CO2: 25 mmol/L (ref 22–32)
Calcium: 9.5 mg/dL (ref 8.9–10.3)
Chloride: 103 mmol/L (ref 101–111)
Creatinine, Ser: 0.77 mg/dL (ref 0.44–1.00)
GFR calc Af Amer: >60 mL/min (ref >60)
GFR calc non Af Amer: >60 mL/min (ref >60)
Glucose, Bld: 102 mg/dL — ABNORMAL HIGH (ref 65–99)
Potassium: 3.7 mmol/L (ref 3.5–5.1)
Sodium: 139 mmol/L (ref 135–145)

## 2018-03-29 NOTE — ED Triage Notes (Signed)
Pt reports LUQ pain since Sunday. Reports feels "swollen". Denied N/V, reports diarrhea. Denies dysuria.

## 2018-03-30 ENCOUNTER — Emergency Department: Payer: Medicare Other

## 2018-03-30 ENCOUNTER — Encounter: Payer: Self-pay | Admitting: Radiology

## 2018-03-30 ENCOUNTER — Emergency Department
Admission: EM | Admit: 2018-03-30 | Discharge: 2018-03-30 | Disposition: A | Discharge status: Home / Self Care | Payer: Medicare Other | Attending: Emergency Medicine | Admitting: Emergency Medicine

## 2018-03-30 DIAGNOSIS — K5730 Diverticulosis of large intestine without perforation or abscess without bleeding: Secondary | ICD-10-CM | POA: Diagnosis not present

## 2018-03-30 DIAGNOSIS — R1012 Left upper quadrant pain: Secondary | ICD-10-CM

## 2018-03-30 DIAGNOSIS — K2900 Acute gastritis without bleeding: Secondary | ICD-10-CM

## 2018-03-30 DIAGNOSIS — K8689 Other specified diseases of pancreas: Secondary | ICD-10-CM | POA: Diagnosis not present

## 2018-03-30 LAB — URINALYSIS, COMPLETE (UACMP) WITH MICROSCOPIC
Bilirubin Urine: NEGATIVE
Glucose, UA: NEGATIVE mg/dL
HGB URINE DIPSTICK: NEGATIVE
Ketones, ur: NEGATIVE mg/dL
Leukocytes, UA: NEGATIVE
NITRITE: NEGATIVE
Protein, ur: NEGATIVE mg/dL
SPECIFIC GRAVITY, URINE: 1.026 (ref 1.005–1.030)
pH: 5 (ref 5.0–8.0)

## 2018-03-30 MED ORDER — SODIUM CHLORIDE 0.9 % IV BOLUS
1000.0000 mL | Freq: Once | INTRAVENOUS | Status: AC
  Administered 2018-03-30: 1000 mL via INTRAVENOUS

## 2018-03-30 MED ORDER — GI COCKTAIL ~~LOC~~
30.0000 mL | Freq: Once | ORAL | Status: AC
  Administered 2018-03-30: 30 mL via ORAL
  Filled 2018-03-30: qty 30

## 2018-03-30 MED ORDER — IOPAMIDOL (ISOVUE-300) INJECTION 61%
100.0000 mL | Freq: Once | INTRAVENOUS | Status: AC | PRN
  Administered 2018-03-30: 100 mL via INTRAVENOUS

## 2018-03-30 MED ORDER — LOSARTAN POTASSIUM 50 MG PO TABS
100.0000 mg | ORAL_TABLET | Freq: Once | ORAL | Status: AC
  Administered 2018-03-30: 100 mg via ORAL
  Filled 2018-03-30: qty 2

## 2018-03-30 MED ORDER — KETOROLAC TROMETHAMINE 30 MG/ML IJ SOLN
30.0000 mg | Freq: Once | INTRAMUSCULAR | Status: AC
  Administered 2018-03-30: 30 mg via INTRAVENOUS
  Filled 2018-03-30: qty 1

## 2018-03-30 MED ORDER — KETOROLAC TROMETHAMINE 60 MG/2ML IM SOLN
60.0000 mg | Freq: Once | INTRAMUSCULAR | Status: DC
  Filled 2018-03-30: qty 2

## 2018-03-30 MED ORDER — MORPHINE SULFATE (PF) 4 MG/ML IV SOLN
4.0000 mg | Freq: Once | INTRAVENOUS | Status: AC
  Administered 2018-03-30: 4 mg via INTRAVENOUS
  Filled 2018-03-30: qty 1

## 2018-03-30 MED ORDER — HYDROCHLOROTHIAZIDE 25 MG PO TABS
25.0000 mg | ORAL_TABLET | Freq: Once | ORAL | Status: AC
  Administered 2018-03-30: 25 mg via ORAL
  Filled 2018-03-30: qty 1

## 2018-03-30 MED ORDER — FAMOTIDINE 40 MG PO TABS: 40.0000 mg | ORAL_TABLET | Freq: Every evening | ORAL | 0 refills | Status: DC

## 2018-03-30 MED ORDER — ONDANSETRON HCL 4 MG/2ML IJ SOLN
4.0000 mg | Freq: Once | INTRAMUSCULAR | Status: AC
  Administered 2018-03-30: 4 mg via INTRAVENOUS
  Filled 2018-03-30: qty 2

## 2018-03-30 MED ORDER — SUCRALFATE 1 G PO TABS: 1.0000 g | ORAL_TABLET | Freq: Two times a day (BID) | ORAL | 0 refills | Status: DC

## 2018-03-30 NOTE — Discharge Instructions (Addendum)
Please follow-up with GI for further evaluation of your symptoms.  Please return with any worsening pain, nausea, vomiting, fever or any other concerns.

## 2018-03-30 NOTE — ED Provider Notes (Signed)
[facility] Emergency Department Provider Note   ____________________________________________   First MD Initiated Contact with Patient 03/30/18 0408     (approximate)  I have reviewed the triage vital signs and the nursing notes.   HISTORY  Chief Complaint Abdominal Pain    HPI [name] is a 57 y.o. female who comes into the hospital today spent some pain at the left upper quadrant.  She states that this been going on for 2 to 3 days.  The patient states that the pain started out of nowhere and has been getting worse.  The patient states that when it started she had just been laying around.  She laid around most of the day with no relief.  The patient denies any nausea or vomiting.  She did have some mild diarrhea and rates her pain a 10 out of 10 in intensity.  The patient took some Aleve and ibuprofen at home.  She denies pain with urination or any fever.  She is here today for evaluation of her symptoms.   Past Medical History:  Diagnosis Date  . Contact dermatitis and other eczema, due to unspecified cause   . COPD (chronic obstructive pulmonary disease) ([place])   . Depressive disorder   . Dysmetabolic syndrome X   . Hyperlipidemia   . Hypertension   . IBS (irritable bowel syndrome)   . Insomnia   . Lumbago   . Nonspecific abnormal electrocardiogram (ECG) (EKG)   . Other ovarian failure(256.39)   . Postmenopausal atrophic vaginitis   . Symptomatic menopausal or female climacteric states   . Unspecified vitamin D deficiency     Patient Active Problem List   Diagnosis Date Noted  . Carpal tunnel syndrome on both sides 10/21/2017  . CRP elevated 08/09/2017  . Elevated C-reactive protein 07/28/2017  . Osteoarthritis of both knees 02/19/2016  . COPD, mild ([place]) 05/08/2015  . Vitamin D deficiency 05/07/2015  . Metabolic syndrome 27/25/3664  . Eczema 05/07/2015  . Chronic insomnia 05/07/2015  . Hypertension, benign 05/07/2015  .  Dyslipidemia 05/07/2015  . Hyperglycemia 05/07/2015  . Chronic low back pain 05/07/2015  . IBS (irritable bowel syndrome) 05/07/2015  . Gastroesophageal reflux disease without esophagitis 05/07/2015  . Major depression, chronic ([place]) 05/07/2015  . Menopausal syndrome (hot flashes) 05/07/2015  . History of postmenopausal bleeding 01/14/2015    Past Surgical History:  Procedure Laterality Date  . BREAST BIOPSY Left    neg  . BREAST LUMPECTOMY    . CERVICAL DISCECTOMY     x 2  . CHOLECYSTECTOMY    . DILATATION & CURETTAGE/HYSTEROSCOPY WITH MYOSURE N/A 03/21/2015   Procedure: DILATATION & CURETTAGE/HYSTEROSCOPY;  Surgeon: Aletha Halim, MD;  Location: [facility] ORS;  Service: Gynecology;  Laterality: N/A;  . ENDOMETRIAL ABLATION    . SPINAL FUSION    . TONSILLECTOMY    . TUBAL LIGATION      Prior to Admission medications   Medication Sig Start Date End Date Taking? Authorizing Provider  acetaminophen (TYLENOL) 500 MG tablet Take 1 tablet (500 mg total) by mouth every 6 (six) hours as needed. 02/19/16   Steele Sizer, MD  amitriptyline (ELAVIL) 10 MG tablet Take 1 tablet (10 mg total) by mouth at bedtime. 01/19/18   Steele Sizer, MD  benzonatate (TESSALON) 100 MG capsule Take 1-2 capsules (100-200 mg total) by mouth 3 (three) times daily as needed. 01/19/18   Steele Sizer, MD  clindamycin (CLEOCIN) 300 MG capsule TAKE ONE CAPSULE BY MOUTH EVERY  [place] 01/03/18   [provider]  EVZIO 0.4 MG/0.4ML SOAJ  08/05/15   [provider]  famotidine (PEPCID) 40 MG tablet Take 1 tablet (40 mg total) by mouth every evening. 03/30/18 03/30/19  Loney Hering, MD  fluticasone furoate-vilanterol (BREO ELLIPTA) 100-25 MCG/INH AEPB Inhale 1 puff into the lungs daily. 01/19/18   Steele Sizer, MD  gabapentin (NEURONTIN) 300 MG capsule Take 1 capsule by mouth every 12 (twelve) hours. 05/07/15   [provider]  lidocaine (LIDODERM) 5 % Place 1 patch onto the skin  daily. Remove & Discard patch within 12 hours or as directed by MD 10/21/17   Steele Sizer, MD  losartan-hydrochlorothiazide (HYZAAR) 100-25 MG tablet Take 1 tablet by mouth daily. 01/19/18   Steele Sizer, MD  meloxicam (MOBIC) 7.5 MG tablet Take 1 tablet (7.5 mg total) by mouth daily. 10/21/17   Steele Sizer, MD  metFORMIN (GLUCOPHAGE) 500 MG tablet Take 1 tablet (500 mg total) by mouth daily with breakfast. 10/21/17   Steele Sizer, MD  oxyCODONE (ROXICODONE) 15 MG immediate release tablet Take 1 tablet by mouth every 4 (four) hours. 05/07/15   [provider]  PRISTIQ 50 MG 24 hr tablet Take 1 tablet (50 mg total) by mouth daily. 10/21/17   Steele Sizer, MD  QUEtiapine (SEROQUEL) 25 MG tablet Take 1 tablet (25 mg total) by mouth at bedtime. 10/21/17   Steele Sizer, MD  ranitidine (ZANTAC) 300 MG tablet TAKE ONE TABLET BY MOUTH DAILY AS NEEDED HEARTBURN 10/21/17   Ancil Boozer, Drue Stager, MD  sucralfate (CARAFATE) 1 g tablet Take 1 tablet (1 g total) by mouth 2 (two) times daily. 03/30/18   Loney Hering, MD  tiZANidine (ZANAFLEX) 4 MG tablet Take 1 tablet by mouth every 12 (twelve) hours. 05/07/15   [provider]  triamcinolone (KENALOG) 0.025 % cream Apply topically 2 (two) times daily. 05/29/16   Steele Sizer, MD    Allergies Patient has no known allergies.  Family History  Problem Relation Age of Onset  . Heart disease Mother   . Thyroid cancer Mother   . Depression Daughter   . Asthma Daughter     Social History Social History   Tobacco Use  . Smoking status: Current Every Day Smoker    Packs/day: 0.25    Years: 15.00    Pack years: 3.75    Types: Cigarettes    Start date: 07/26/2002  . Smokeless tobacco: Never Used  Substance Use Topics  . Alcohol use: No    Alcohol/week: 0.0 oz  . Drug use: No    Review of Systems  Constitutional: No fever/chills Eyes: No visual changes. ENT: No sore throat. Cardiovascular: Denies chest  pain. Respiratory: Denies shortness of breath. Gastrointestinal:  abdominal pain  and diarrhea No nausea, no vomiting.  No constipation. Genitourinary: Negative for dysuria. Musculoskeletal: Negative for back pain. Skin: Negative for rash. Neurological: Negative for headaches   ____________________________________________   PHYSICAL EXAM:  VITAL SIGNS: ED Triage Vitals  Enc Vitals Group     BP 03/29/18 2319 (!) 182/92     Pulse Rate 03/29/18 2319 84     Resp 03/29/18 2319 18     Temp 03/29/18 2319 98.4 F (36.9 C)     Temp Source 03/29/18 2319 Oral     SpO2 03/29/18 2319 99 %     Weight 03/29/18 2320 195 lb (88.5 kg)     Height 03/29/18 2320 5\' 8"  (1.727 m)  Head Circumference --      Peak Flow --      Pain Score 03/29/18 2327 10     Pain Loc --      Pain Edu? --      Excl. in [place]? --     Constitutional: Alert and oriented. Well appearing and in moderate distress. Eyes: Conjunctivae are normal. PERRL. EOMI. Head: Atraumatic. Nose: No congestion/rhinnorhea. Mouth/Throat: Mucous membranes are moist.  Oropharynx non-erythematous. Cardiovascular: Normal rate, regular rhythm. Grossly normal heart sounds.  Good peripheral circulation. Respiratory: Normal respiratory effort.  No retractions. Lungs CTAB. Gastrointestinal: Soft with left upper quadrant tenderness to palpation. No distention.  Positive bowel sounds Musculoskeletal: No lower extremity tenderness nor edema.   Neurologic:  Normal speech and language.  Skin:  Skin is warm, dry and intact.  Psychiatric: Mood and affect are normal.   ____________________________________________   LABS (all labs ordered are listed, but only abnormal results are displayed)  Labs Reviewed  BASIC METABOLIC PANEL - Abnormal; Notable for the following components:      Result Value   Glucose, Bld 102 (*)    All other components within normal limits  URINALYSIS, COMPLETE (UACMP) WITH MICROSCOPIC - Abnormal; Notable for the  following components:   Color, Urine YELLOW (*)    APPearance CLEAR (*)    Bacteria, UA RARE (*)    All other components within normal limits  CBC   ____________________________________________  EKG  ED ECG REPORT I, Loney Hering, the attending physician, personally viewed and interpreted this ECG.   Date: 03/29/2018  EKG Time: 2336  Rate: 76  Rhythm: normal sinus rhythm  Axis: normal  Intervals:none  ST&T Change: none  ____________________________________________  RADIOLOGY  ED MD interpretation:  CT abd and pelvis: No acute abnormality seen to explain the patient's symptoms, minimal diverticulosis along the sigmoid colon without evidence of diverticulitis.  Nonspecific cystic focus in the distal body of the pancreas recommend follow-up in 1 year.  Official radiology report(s): Ct Abdomen Pelvis W Contrast  Result Date: 03/30/2018 CLINICAL DATA:  Acute onset of left upper quadrant abdominal pain and diarrhea. EXAM: CT ABDOMEN AND PELVIS WITH CONTRAST TECHNIQUE: Multidetector CT imaging of the abdomen and pelvis was performed using the standard protocol following bolus administration of intravenous contrast. CONTRAST:  123mL ISOVUE-300 IOPAMIDOL (ISOVUE-300) INJECTION 61% COMPARISON:  CT of the abdomen and pelvis from 07/29/2009, abdominal ultrasound performed 12/10/2009, and MRI of the lumbar spine performed 02/15/2017 FINDINGS: Lower chest: The visualized lung bases are grossly clear. The visualized portions of the mediastinum are unremarkable. Hepatobiliary: The liver is unremarkable in appearance. The patient is status post cholecystectomy, with clips noted at the gallbladder fossa. The common bile duct remains normal in caliber. Pancreas: A nonspecific 1.0 cm hypodensity is noted at the distal body of the pancreas, of uncertain significance. The pancreas is otherwise unremarkable. Spleen: The spleen is unremarkable in appearance. Adrenals/Urinary Tract: The adrenal glands  are unremarkable in appearance. The kidneys are within normal limits. There is no evidence of hydronephrosis. No renal or ureteral stones are identified. No perinephric stranding is seen. Stomach/Bowel: The stomach is unremarkable in appearance. The small bowel is within normal limits. The appendix is normal in caliber, without evidence of appendicitis. Minimal diverticulosis is noted along the sigmoid colon, without evidence of diverticulitis. Vascular/Lymphatic: Minimal calcification is noted along the abdominal aorta. The inferior vena cava is grossly unremarkable. No retroperitoneal lymphadenopathy is seen. No pelvic sidewall lymphadenopathy is identified. Reproductive: The bladder is  mildly distended and grossly unremarkable. The uterus is unremarkable in appearance. The ovaries are relatively symmetric. No suspicious adnexal masses are seen. Other: No additional soft tissue abnormalities are seen. Musculoskeletal: No acute osseous abnormalities are identified. Mild facet disease is noted at the lower lumbar spine, with multilevel vacuum phenomenon at the lower lumbar spine. The visualized musculature is unremarkable in appearance. IMPRESSION: 1. No acute abnormality seen to explain the patient's symptoms. 2. Minimal diverticulosis along the sigmoid colon, without evidence of diverticulitis. 3. Nonspecific 1.0 cm cystic focus at the distal body of the pancreas, of uncertain significance. Recommend follow up pre and post contrast MRI/MRCP or pancreatic protocol CT in 1 year. This recommendation follows ACR consensus guidelines: Management of Incidental Pancreatic Cysts: A White Paper of the ACR Incidental Findings Committee. [place] [id number]. Electronically Signed   By: Garald Balding M.D.   On: 03/30/2018 05:05    ____________________________________________   PROCEDURES  Procedure(s) performed: None  Procedures  Critical Care performed:  No  ____________________________________________   INITIAL IMPRESSION / ASSESSMENT AND PLAN / ED COURSE  As part of my medical decision making, I reviewed the following data within the electronic MEDICAL RECORD NUMBER Notes from prior ED visits and [place] Controlled Substance Database   This is a 56 year old female who comes into the hospital today with some left upper quadrant abdominal pain.    My differential diagnosis includes gastritis, perforation, ulcer, diverticulitis  We did check some blood work on the patient to include a CBC, BMP and a urinalysis.  The patient's blood work is unremarkable.  I sent the patient for a CT scan which did not show any cause of her pain.  I gave the patient some morphine, Zofran some GI cocktail and Toradol.  Her pain has improved to a 5 out of 10 in intensity.  I feel that the patient may have some gastritis or ulcer.  I will give her some medication for home and have a follow-up with the GI physician.  The patient should return with any worsening concern or any other condition.      ____________________________________________   FINAL CLINICAL IMPRESSION(S) / ED DIAGNOSES  Final diagnoses:  Left upper quadrant pain  Other acute gastritis without hemorrhage     ED Discharge Orders        Ordered    sucralfate (CARAFATE) 1 g tablet  2 times daily     03/30/18 0837    famotidine (PEPCID) 40 MG tablet  Every evening     03/30/18 0086       Note:  This document was prepared using Dragon voice recognition software and may include unintentional dictation errors.    Loney Hering, MD 03/30/18 [id number]

## 2018-04-04 ENCOUNTER — Other Ambulatory Visit: Payer: Self-pay

## 2018-04-04 ENCOUNTER — Encounter: Payer: Self-pay | Admitting: Gastroenterology

## 2018-04-04 ENCOUNTER — Ambulatory Visit (INDEPENDENT_AMBULATORY_CARE_PROVIDER_SITE_OTHER): Payer: Medicare Other | Admitting: Gastroenterology

## 2018-04-04 VITALS — BP 137/78 | HR 73 | Temp 98.1°F | Wt 202.6 lb

## 2018-04-04 DIAGNOSIS — R1012 Left upper quadrant pain: Secondary | ICD-10-CM

## 2018-04-04 DIAGNOSIS — K862 Cyst of pancreas: Secondary | ICD-10-CM | POA: Diagnosis not present

## 2018-04-04 NOTE — Progress Notes (Addendum)
[name] [address]  [place]  [place], [place] [address]  Main: [phone]  Fax: [phone]   Gastroenterology Consultation  Referring Provider:     Steele Sizer, MD Primary Care Physician:  Steele Sizer, MD Primary Gastroenterologist:  Dr. Vonda Daniel Reason for Consultation:     Abdominal pain        HPI:    Chief Complaint  Patient presents with  . Establish Care    ED f/u for acute gastritis, LUQ pain. dull, nagging pain    [name] is a 57 y.o. y/o female referred for consultation & management  by Dr. Ancil Boozer, Drue Stager, MD.  Patient reports 4 to 5-day history of left upper quadrant, dull, nagging, 5/10 abdominal pain, unrelated to meals.  No heartburn or dysphagia.  She went to the ER for this on May 29, and was given Pepcid and sucralfate.  And she states this has relieved her pain somewhat, and it is less severe.  Patient is on chronic NSAIDs.  Was on Mobic, which was recently changed to oral diclofenac.  She does not look at her stools to see if she has seen any melena or hematochezia.  No previous EGDs.  Does report of the last 1 month, she has alternating constipation and loose stools.  No immediate family history of colon cancer.  Last colonoscopy was in and a 2 mm sigmoid colon polyp was removed by Dr. Gustavo Lah.  Pathology report not available.  2010 November  She had a CT scan during her ER visit in May 2019, and that showed a 1 cm pancreatic body cyst.  Diverticulosis was also reported without diverticulitis.  Past Medical History:  Diagnosis Date  . Contact dermatitis and other eczema, due to unspecified cause   . COPD (chronic obstructive pulmonary disease) ([facility])   . Depressive disorder   . Dysmetabolic syndrome X   . Hyperlipidemia   . Hypertension   . IBS (irritable bowel syndrome)   . Insomnia   . Lumbago   . Nonspecific abnormal electrocardiogram (ECG) (EKG)   . Other ovarian failure(256.39)   . Postmenopausal  atrophic vaginitis   . Symptomatic menopausal or female climacteric states   . Unspecified vitamin D deficiency     Past Surgical History:  Procedure Laterality Date  . BREAST BIOPSY Left    neg  . BREAST LUMPECTOMY    . CERVICAL DISCECTOMY     x 2  . CHOLECYSTECTOMY    . DILATATION & CURETTAGE/HYSTEROSCOPY WITH MYOSURE N/A 03/21/2015   Procedure: DILATATION & CURETTAGE/HYSTEROSCOPY;  Surgeon: Aletha Halim, MD;  Location: [facility] ORS;  Service: Gynecology;  Laterality: N/A;  . ENDOMETRIAL ABLATION    . SPINAL FUSION    . TONSILLECTOMY    . TUBAL LIGATION      Prior to Admission medications   Medication Sig Start Date End Date Taking? Authorizing Provider  acetaminophen (TYLENOL) 500 MG tablet Take 1 tablet (500 mg total) by mouth every 6 (six) hours as needed. 02/19/16  Yes Sowles, Drue Stager, MD  amitriptyline (ELAVIL) 10 MG tablet Take 1 tablet (10 mg total) by mouth at bedtime. 01/19/18  Yes Sowles, Drue Stager, MD  diclofenac (VOLTAREN) 75 MG EC tablet diclofenac sodium 75 mg tablet,delayed release  TAKE ONE TABLET BY MOUTH TWICE DAILY 02/25/18  Yes [provider]  famotidine (PEPCID) 40 MG tablet Take 1 tablet (40 mg total) by mouth every evening. 03/30/18 03/30/19 Yes Loney Hering, MD  fluticasone furoate-vilanterol (BREO ELLIPTA) 100-25  MCG/INH AEPB Inhale 1 puff into the lungs daily. 01/19/18  Yes Sowles, Drue Stager, MD  gabapentin (NEURONTIN) 300 MG capsule Take 1 capsule by mouth every 12 (twelve) hours. 05/07/15  Yes [provider]  lidocaine (LIDODERM) 5 % Place 1 patch onto the skin daily. Remove & Discard patch within 12 hours or as directed by MD 10/21/17  Yes Sowles, Drue Stager, MD  losartan-hydrochlorothiazide (HYZAAR) 100-25 MG tablet Take 1 tablet by mouth daily. 01/19/18  Yes Sowles, Drue Stager, MD  metFORMIN (GLUCOPHAGE) 500 MG tablet Take 1 tablet (500 mg total) by mouth daily with breakfast. 10/21/17  Yes Sowles, Drue Stager, MD  oxyCODONE (ROXICODONE) 15 MG  immediate release tablet Take 1 tablet by mouth every 4 (four) hours. 05/07/15  Yes [provider]  PRISTIQ 50 MG 24 hr tablet Take 1 tablet (50 mg total) by mouth daily. 10/21/17  Yes Sowles, Drue Stager, MD  QUEtiapine (SEROQUEL) 25 MG tablet Take 1 tablet (25 mg total) by mouth at bedtime. 10/21/17  Yes Sowles, Drue Stager, MD  ranitidine (ZANTAC) 300 MG tablet TAKE ONE TABLET BY MOUTH DAILY AS NEEDED HEARTBURN 10/21/17  Yes Sowles, Drue Stager, MD  tiZANidine (ZANAFLEX) 4 MG tablet Take 1 tablet by mouth every 12 (twelve) hours. 05/07/15  Yes [provider]  triamcinolone (KENALOG) 0.025 % cream Apply topically 2 (two) times daily. 05/29/16  Yes Sowles, Drue Stager, MD  benzonatate (TESSALON) 100 MG capsule Take 1-2 capsules (100-200 mg total) by mouth 3 (three) times daily as needed. Patient not taking: Reported on 04/04/2018 01/19/18   Steele Sizer, MD  clindamycin (CLEOCIN) 300 MG capsule TAKE ONE CAPSULE BY MOUTH EVERY 6 HOURS UNTIL GONE 01/03/18   [provider]  EVZIO 0.4 MG/0.4ML SOAJ  08/05/15   [provider]  meloxicam (MOBIC) 7.5 MG tablet Take 1 tablet (7.5 mg total) by mouth daily. Patient not taking: Reported on 04/04/2018 10/21/17   Steele Sizer, MD  sucralfate (CARAFATE) 1 g tablet Take 1 tablet (1 g total) by mouth 2 (two) times daily. Patient not taking: Reported on 04/04/2018 03/30/18   Loney Hering, MD    Family History  Problem Relation Age of Onset  . Heart disease Mother   . Thyroid cancer Mother   . Depression Daughter   . Asthma Daughter      Social History   Tobacco Use  . Smoking status: Current Every Day Smoker    Packs/day: 0.25    Years: 15.00    Pack years: 3.75    Types: Cigarettes    Start date: 07/26/2002  . Smokeless tobacco: Never Used  Substance Use Topics  . Alcohol use: No    Alcohol/week: 0.0 oz  . Drug use: No    Allergies as of 04/04/2018  . (No Known Allergies)    Review of Systems:    All systems reviewed  and negative except where noted in HPI.   Physical Exam:  BP 137/78   Pulse 73   Temp 98.1 F (36.7 C) (Oral)   Wt 202 lb 9.6 oz (91.9 kg)   BMI 30.81 kg/m  No LMP recorded. Patient has had an ablation. Psych:  Alert and cooperative. Normal mood and affect. General:   Alert,  Well-developed, well-nourished, pleasant and cooperative in NAD Head:  Normocephalic and atraumatic. Eyes:  Sclera clear, no icterus.   Conjunctiva pink. Ears:  Normal auditory acuity. Nose:  No deformity, discharge, or lesions. Mouth:  No deformity or lesions,oropharynx pink & moist. Neck:  Supple; no masses or  thyromegaly. Lungs:  Respirations even and unlabored.  Clear throughout to auscultation.   No wheezes, crackles, or rhonchi. No acute distress. Heart:  Regular rate and rhythm; no murmurs, clicks, rubs, or gallops. Abdomen:  Normal bowel sounds.  No bruits.  Soft, non-tender and non-distended without masses, hepatosplenomegaly or hernias noted.  No guarding or rebound tenderness.    Msk:  Symmetrical without gross deformities. Good, equal movement & strength bilaterally. Pulses:  Normal pulses noted. Extremities:  No clubbing or edema.  No cyanosis. Neurologic:  Alert and oriented x3;  grossly normal neurologically. Skin:  Intact without significant lesions or rashes. No jaundice. Lymph Nodes:  No significant cervical adenopathy. Psych:  Alert and cooperative. Normal mood and affect.   Labs: CBC    Component Value Date/Time   WBC 9.2 03/29/2018 2330   RBC 4.05 03/29/2018 2330   HGB 13.1 03/29/2018 2330   HCT 37.8 03/29/2018 2330   PLT 261 03/29/2018 2330   MCV 93.3 03/29/2018 2330   MCH 32.2 03/29/2018 2330   MCHC 34.5 03/29/2018 2330   RDW 12.5 03/29/2018 2330   LYMPHSABS 2,223 07/26/2017 1154   MONOABS 0.6 03/08/2017 1341   EOSABS 98 07/26/2017 1154   BASOSABS 20 07/26/2017 1154   CMP     Component Value Date/Time   NA 139 03/29/2018 2330   K 3.7 03/29/2018 2330   CL 103 03/29/2018  2330   CO2 25 03/29/2018 2330   GLUCOSE 102 (H) 03/29/2018 2330   BUN 13 03/29/2018 2330   CREATININE 0.77 03/29/2018 2330   CREATININE 0.66 01/19/2018 1042   CALCIUM 9.5 03/29/2018 2330   PROT 8.1 01/19/2018 1042   ALBUMIN 3.7 03/08/2017 1341   AST 22 01/19/2018 1042   ALT 30 (H) 01/19/2018 1042   ALKPHOS 71 03/08/2017 1341   BILITOT 0.5 01/19/2018 1042   GFRNONAA >60 03/29/2018 2330   GFRNONAA 99 01/19/2018 1042   GFRAA >60 03/29/2018 2330   GFRAA 115 01/19/2018 1042    Imaging Studies: Ct Abdomen Pelvis W Contrast  Result Date: 03/30/2018 CLINICAL DATA:  Acute onset of left upper quadrant abdominal pain and diarrhea. EXAM: CT ABDOMEN AND PELVIS WITH CONTRAST TECHNIQUE: Multidetector CT imaging of the abdomen and pelvis was performed using the standard protocol following bolus administration of intravenous contrast. CONTRAST:  187mL ISOVUE-300 IOPAMIDOL (ISOVUE-300) INJECTION 61% COMPARISON:  CT of the abdomen and pelvis from 07/29/2009, abdominal ultrasound performed 12/10/2009, and MRI of the lumbar spine performed 02/15/2017 FINDINGS: Lower chest: The visualized lung bases are grossly clear. The visualized portions of the mediastinum are unremarkable. Hepatobiliary: The liver is unremarkable in appearance. The patient is status post cholecystectomy, with clips noted at the gallbladder fossa. The common bile duct remains normal in caliber. Pancreas: A nonspecific 1.0 cm hypodensity is noted at the distal body of the pancreas, of uncertain significance. The pancreas is otherwise unremarkable. Spleen: The spleen is unremarkable in appearance. Adrenals/Urinary Tract: The adrenal glands are unremarkable in appearance. The kidneys are within normal limits. There is no evidence of hydronephrosis. No renal or ureteral stones are identified. No perinephric stranding is seen. Stomach/Bowel: The stomach is unremarkable in appearance. The small bowel is within normal limits. The appendix is normal in  caliber, without evidence of appendicitis. Minimal diverticulosis is noted along the sigmoid colon, without evidence of diverticulitis. Vascular/Lymphatic: Minimal calcification is noted along the abdominal aorta. The inferior vena cava is grossly unremarkable. No retroperitoneal lymphadenopathy is seen. No pelvic sidewall lymphadenopathy is identified. Reproductive: The bladder  is mildly distended and grossly unremarkable. The uterus is unremarkable in appearance. The ovaries are relatively symmetric. No suspicious adnexal masses are seen. Other: No additional soft tissue abnormalities are seen. Musculoskeletal: No acute osseous abnormalities are identified. Mild facet disease is noted at the lower lumbar spine, with multilevel vacuum phenomenon at the lower lumbar spine. The visualized musculature is unremarkable in appearance. IMPRESSION: 1. No acute abnormality seen to explain the patient's symptoms. 2. Minimal diverticulosis along the sigmoid colon, without evidence of diverticulitis. 3. Nonspecific 1.0 cm cystic focus at the distal body of the pancreas, of uncertain significance. Recommend follow up pre and post contrast MRI/MRCP or pancreatic protocol CT in 1 year. This recommendation follows ACR consensus guidelines: Management of Incidental Pancreatic Cysts: A White Paper of the ACR Incidental Findings Committee. Ellis [id number]. Electronically Signed   By: Garald Balding M.D.   On: 03/30/2018 05:05    Assessment and Plan:   TIARNA KOPPEN is a 57 y.o. y/o female has been referred for left upper quadrant abdominal pain in the setting of chronic NSAID use  Patient asked to minimize NSAIDs Given chronic use, abdominal pain that led to an ER visit, will evaluate with EGD to rule out any peptic ulcer disease from NSAIDs We will try to schedule it for this week No anemia, no evidence of active GI bleeding, to indicate emergent endoscopy at this time If peptic ulcer disease exist, she  will need to be taken off her NSAIDs  I have discussed alternative options, risks & benefits,  which include, but are not limited to, bleeding, infection, perforation,respiratory complication & drug reaction.  The patient agrees with this plan & written consent will be obtained.     Her next colonoscopy is due in October 2020, as per her documented synopsis/encounters However, this can be done earlier, and she continues to have alternating constipation and diarrhea However, we will not schedule colonoscopy at this time, given her left upper quadrant abdominal pain, to first rule out peptic ulcer disease prior to  elective colonoscopy   I have messaged radiologist to read her recent CT scan to see if they can evaluate any previous imaging available, to see if the pancreatic cyst was present in the past as well No alarm features present It is only 1 cm in size, and is unlikely to be causing her left upper quadrant abdominal pain CT with pancreatic protocol or MRCP, to evaluate the cyst can be considered in 2 to 3 months No family history of pancreatic cancer.  No weight loss, no alarm symptoms present  As per radiologist: "Hi Dr. Bonna Gains,   It looks like the cyst is new from 2010. The recommended follow-up would be the pre and post contrast MRI/MRCP or pancreatic protocol CT in 1 year, per ACR consensus guidelines. Thanks very much!   Merry Proud "  Dr [name]

## 2018-04-04 NOTE — Patient Instructions (Signed)
F/U  3 months 

## 2018-04-08 DIAGNOSIS — M25512 Pain in left shoulder: Secondary | ICD-10-CM | POA: Diagnosis not present

## 2018-04-08 DIAGNOSIS — M79661 Pain in right lower leg: Secondary | ICD-10-CM | POA: Diagnosis not present

## 2018-04-08 DIAGNOSIS — M25551 Pain in right hip: Secondary | ICD-10-CM | POA: Diagnosis not present

## 2018-04-08 DIAGNOSIS — M25511 Pain in right shoulder: Secondary | ICD-10-CM | POA: Diagnosis not present

## 2018-04-08 DIAGNOSIS — M545 Low back pain: Secondary | ICD-10-CM | POA: Diagnosis not present

## 2018-04-08 DIAGNOSIS — M25552 Pain in left hip: Secondary | ICD-10-CM | POA: Diagnosis not present

## 2018-04-08 DIAGNOSIS — M79662 Pain in left lower leg: Secondary | ICD-10-CM | POA: Diagnosis not present

## 2018-04-08 DIAGNOSIS — M542 Cervicalgia: Secondary | ICD-10-CM | POA: Diagnosis not present

## 2018-04-08 DIAGNOSIS — M25561 Pain in right knee: Secondary | ICD-10-CM | POA: Diagnosis not present

## 2018-04-08 DIAGNOSIS — G894 Chronic pain syndrome: Secondary | ICD-10-CM | POA: Diagnosis not present

## 2018-04-08 DIAGNOSIS — M25562 Pain in left knee: Secondary | ICD-10-CM | POA: Diagnosis not present

## 2018-04-12 ENCOUNTER — Encounter: Payer: Self-pay | Admitting: *Deleted

## 2018-04-13 ENCOUNTER — Ambulatory Visit: Payer: Medicare Other | Admitting: Certified Registered"

## 2018-04-13 ENCOUNTER — Ambulatory Visit
Admission: RE | Admit: 2018-04-13 | Discharge: 2018-04-13 | Disposition: A | Discharge status: Home / Self Care | Payer: Medicare Other | Source: Ambulatory Visit | Attending: Gastroenterology | Admitting: Gastroenterology

## 2018-04-13 ENCOUNTER — Encounter: Payer: Self-pay | Admitting: *Deleted

## 2018-04-13 ENCOUNTER — Encounter
Admission: RE | Disposition: A | Discharge status: Home / Self Care | Payer: Self-pay | Source: Ambulatory Visit | Attending: Gastroenterology

## 2018-04-13 DIAGNOSIS — F329 Major depressive disorder, single episode, unspecified: Secondary | ICD-10-CM | POA: Insufficient documentation

## 2018-04-13 DIAGNOSIS — E785 Hyperlipidemia, unspecified: Secondary | ICD-10-CM | POA: Insufficient documentation

## 2018-04-13 DIAGNOSIS — E559 Vitamin D deficiency, unspecified: Secondary | ICD-10-CM | POA: Diagnosis not present

## 2018-04-13 DIAGNOSIS — K317 Polyp of stomach and duodenum: Secondary | ICD-10-CM | POA: Insufficient documentation

## 2018-04-13 DIAGNOSIS — R109 Unspecified abdominal pain: Secondary | ICD-10-CM | POA: Diagnosis not present

## 2018-04-13 DIAGNOSIS — K2950 Unspecified chronic gastritis without bleeding: Secondary | ICD-10-CM | POA: Diagnosis not present

## 2018-04-13 DIAGNOSIS — F17210 Nicotine dependence, cigarettes, uncomplicated: Secondary | ICD-10-CM | POA: Insufficient documentation

## 2018-04-13 DIAGNOSIS — K210 Gastro-esophageal reflux disease with esophagitis: Secondary | ICD-10-CM | POA: Diagnosis not present

## 2018-04-13 DIAGNOSIS — Z79899 Other long term (current) drug therapy: Secondary | ICD-10-CM | POA: Insufficient documentation

## 2018-04-13 DIAGNOSIS — R1013 Epigastric pain: Secondary | ICD-10-CM

## 2018-04-13 DIAGNOSIS — K259 Gastric ulcer, unspecified as acute or chronic, without hemorrhage or perforation: Secondary | ICD-10-CM | POA: Diagnosis not present

## 2018-04-13 DIAGNOSIS — J449 Chronic obstructive pulmonary disease, unspecified: Secondary | ICD-10-CM | POA: Insufficient documentation

## 2018-04-13 DIAGNOSIS — K253 Acute gastric ulcer without hemorrhage or perforation: Secondary | ICD-10-CM | POA: Diagnosis not present

## 2018-04-13 DIAGNOSIS — K3189 Other diseases of stomach and duodenum: Secondary | ICD-10-CM

## 2018-04-13 DIAGNOSIS — R1012 Left upper quadrant pain: Secondary | ICD-10-CM

## 2018-04-13 DIAGNOSIS — I10 Essential (primary) hypertension: Secondary | ICD-10-CM | POA: Insufficient documentation

## 2018-04-13 DIAGNOSIS — K219 Gastro-esophageal reflux disease without esophagitis: Secondary | ICD-10-CM | POA: Diagnosis not present

## 2018-04-13 HISTORY — PX: ESOPHAGOGASTRODUODENOSCOPY (EGD) WITH PROPOFOL: SHX5813

## 2018-04-13 HISTORY — DX: Gastro-esophageal reflux disease without esophagitis: K21.9

## 2018-04-13 SURGERY — ESOPHAGOGASTRODUODENOSCOPY (EGD) WITH PROPOFOL
Anesthesia: General

## 2018-04-13 MED ORDER — ONDANSETRON HCL 4 MG/2ML IJ SOLN
INTRAMUSCULAR | Status: DC | PRN
  Administered 2018-04-13: 4 mg via INTRAVENOUS

## 2018-04-13 MED ORDER — LIDOCAINE HCL (CARDIAC) PF 100 MG/5ML IV SOSY
PREFILLED_SYRINGE | INTRAVENOUS | Status: DC | PRN
  Administered 2018-04-13 (×2): 50 mg via INTRAVENOUS

## 2018-04-13 MED ORDER — GLYCOPYRROLATE 0.2 MG/ML IJ SOLN
INTRAMUSCULAR | Status: DC | PRN
  Administered 2018-04-13: 0.1 mg via INTRAVENOUS

## 2018-04-13 MED ORDER — LIDOCAINE HCL (PF) 2 % IJ SOLN
INTRAMUSCULAR | Status: AC
  Filled 2018-04-13: qty 10

## 2018-04-13 MED ORDER — FENTANYL CITRATE (PF) 100 MCG/2ML IJ SOLN
INTRAMUSCULAR | Status: DC | PRN
  Administered 2018-04-13: 100 ug via INTRAVENOUS

## 2018-04-13 MED ORDER — OMEPRAZOLE 20 MG PO CPDR: 20.0000 mg | DELAYED_RELEASE_CAPSULE | Freq: Two times a day (BID) | ORAL | 1 refills | Status: DC

## 2018-04-13 MED ORDER — ONDANSETRON HCL 4 MG/2ML IJ SOLN
INTRAMUSCULAR | Status: AC
  Filled 2018-04-13: qty 2

## 2018-04-13 MED ORDER — PROPOFOL 10 MG/ML IV BOLUS
INTRAVENOUS | Status: DC | PRN
Start: 2018-04-13 — End: 2018-04-13
  Administered 2018-04-13 (×4): 50 mg via INTRAVENOUS
  Administered 2018-04-13: 100 mg via INTRAVENOUS

## 2018-04-13 MED ORDER — PROPOFOL 10 MG/ML IV BOLUS
INTRAVENOUS | Status: AC
  Filled 2018-04-13: qty 40

## 2018-04-13 MED ORDER — FENTANYL CITRATE (PF) 100 MCG/2ML IJ SOLN
INTRAMUSCULAR | Status: AC
  Filled 2018-04-13: qty 2

## 2018-04-13 MED ORDER — SODIUM CHLORIDE 0.9 % IV SOLN
INTRAVENOUS | Status: DC
  Administered 2018-04-13: 1000 mL via INTRAVENOUS

## 2018-04-13 MED ORDER — GLYCOPYRROLATE 0.2 MG/ML IJ SOLN
INTRAMUSCULAR | Status: AC
  Filled 2018-04-13: qty 1

## 2018-04-13 NOTE — Anesthesia Preprocedure Evaluation (Signed)
Anesthesia Evaluation  Patient identified by MRN, date of birth, ID band Patient awake    Reviewed: Allergy & Precautions, H&P , NPO status , Patient's Chart, lab work & pertinent test results  Airway Mallampati: II   Neck ROM: Full    Dental  (+) Chipped, Dental Advidsory Given, Missing   Pulmonary neg shortness of breath, COPD, neg recent URI, Current Smoker,    Pulmonary exam normal        Cardiovascular hypertension, (-) angina(-) CAD, (-) Past MI and (-) Cardiac Stents Normal cardiovascular exam(-) dysrhythmias (-) Valvular Problems/Murmurs     Neuro/Psych PSYCHIATRIC DISORDERS Depression    GI/Hepatic Neg liver ROS, GERD  ,  Endo/Other  negative endocrine ROS  Renal/GU negative Renal ROS  negative genitourinary   Musculoskeletal negative musculoskeletal ROS (+)   Abdominal Normal abdominal exam  (+)   Peds negative pediatric ROS (+)  Hematology negative hematology ROS (+)   Anesthesia Other Findings Past Medical History: No date: Contact dermatitis and other eczema, due to unspecified cause No date: COPD (chronic obstructive pulmonary disease) ([facility]) No date: Depressive disorder No date: Dysmetabolic syndrome X No date: GERD (gastroesophageal reflux disease) No date: Hyperlipidemia No date: Hypertension No date: IBS (irritable bowel syndrome) No date: Insomnia No date: Lumbago No date: Nonspecific abnormal electrocardiogram (ECG) (EKG) No date: Other ovarian failure(256.39) No date: Postmenopausal atrophic vaginitis No date: Symptomatic menopausal or female climacteric states No date: Unspecified vitamin D deficiency   Reproductive/Obstetrics negative OB ROS                             Anesthesia Physical  Anesthesia Plan  ASA: III  Anesthesia Plan: General   Post-op Pain Management:    Induction: Intravenous  PONV Risk Score and Plan: 2 and Propofol  infusion  Airway Management Planned: Nasal Cannula  Additional Equipment:   Intra-op Plan:   Post-operative Plan:   Informed Consent: I have reviewed the patients History and Physical, chart, labs and discussed the procedure including the risks, benefits and alternatives for the proposed anesthesia with the patient or authorized representative who has indicated his/her understanding and acceptance.     Plan Discussed with: CRNA and Surgeon  Anesthesia Plan Comments:         Anesthesia Quick Evaluation

## 2018-04-13 NOTE — Anesthesia Postprocedure Evaluation (Signed)
Anesthesia Post Note  Patient: ARLETT GOOLD  Procedure(s) Performed: ESOPHAGOGASTRODUODENOSCOPY (EGD) WITH PROPOFOL (N/A )  Patient location during evaluation: Endoscopy Anesthesia Type: General Level of consciousness: awake and alert, oriented and patient cooperative Pain management: satisfactory to patient Vital Signs Assessment: post-procedure vital signs reviewed and stable Respiratory status: spontaneous breathing and respiratory function stable Cardiovascular status: blood pressure returned to baseline and stable Postop Assessment: no headache, no backache, no apparent nausea or vomiting, patient able to bend at knees, adequate PO intake and able to ambulate Anesthetic complications: no     Last Vitals: There were no vitals filed for this visit.  Last Pain: There were no vitals filed for this visit.               Maliaka Brasington H Sheldon Amara

## 2018-04-13 NOTE — Transfer of Care (Signed)
Immediate Anesthesia Transfer of Care Note  Patient: [name]  Procedure(s) Performed: ESOPHAGOGASTRODUODENOSCOPY (EGD) WITH PROPOFOL (N/A )  Patient Location: PACU and Endoscopy Unit  Anesthesia Type:General  Level of Consciousness: awake, alert , oriented and patient cooperative  Airway & Oxygen Therapy: Patient Spontanous Breathing  Post-op Assessment: Report given to RN, Post -op Vital signs reviewed and stable and Patient moving all extremities  Post vital signs: Reviewed and stable  Last Vitals:  Vitals Value Taken Time  BP    Temp    Pulse 75 04/13/2018 11:36 AM  Resp 16 04/13/2018 11:36 AM  SpO2 98 % 04/13/2018 11:36 AM  Vitals shown include unvalidated device data.  Last Pain: There were no vitals filed for this visit.       Complications: No apparent anesthesia complications

## 2018-04-13 NOTE — Anesthesia Post-op Follow-up Note (Signed)
Anesthesia QCDR form completed.        

## 2018-04-13 NOTE — Op Note (Signed)
[facility] Gastroenterology Patient Name: [name] Procedure Date: 04/13/2018 11:13 AM MRN: [id number] Account #: [id number] Date of Birth: 1961/04/27 Admit Type: Outpatient Age: 57 Room: [facility] ENDO ROOM 2 Gender: Female Note Status: Finalized Procedure:            Upper GI endoscopy Indications:          Epigastric abdominal pain Providers:            Analisse Randle B. Bonna Gains MD, MD Referring MD:         Tania Ade (Referring MD) Medicines:            Monitored Anesthesia Care Complications:        No immediate complications. Procedure:            Pre-Anesthesia Assessment:                       - Prior to the procedure, a History and Physical was                        performed, and patient medications, allergies and                        sensitivities were reviewed. The patient's tolerance of                        previous anesthesia was reviewed.                       - The risks and benefits of the procedure and the                        sedation options and risks were discussed with the                        patient. All questions were answered and informed                        consent was obtained.                       - Patient identification and proposed procedure were                        verified prior to the procedure by the physician, the                        nurse, the anesthesiologist, the anesthetist and the                        technician. The procedure was verified in the procedure                        room.                       - ASA Grade Assessment: III - A patient with severe                        systemic disease.  After obtaining informed consent, the endoscope was                        passed under direct vision. Throughout the procedure,                        the patient's blood pressure, pulse, and oxygen                        saturations were monitored continuously. The Endoscope                    was introduced through the mouth, and advanced to the                        second part of duodenum. The upper GI endoscopy was                        accomplished with ease. The patient tolerated the                        procedure well. Findings:      LA Grade A (one or more mucosal breaks less than 5 mm, not extending       between tops of 2 mucosal folds) esophagitis with no bleeding was found       in the distal esophagus.      Patchy atrophic mucosa was found in the gastric antrum. Biopsies were       obtained in the gastric body, at the incisura and in the gastric antrum       with cold forceps for histology.      One non-bleeding cratered gastric ulcer with no stigmata of bleeding was       found on the greater curvature of the gastric antrum. The lesion was 4       mm in largest dimension. Biopsies were taken with a cold forceps for       histology.      A single 3 mm sessile polyp with no bleeding and no stigmata of recent       bleeding was found in the gastric fundus. Biopsies were taken with a       cold forceps for histology.      The duodenal bulb, second portion of the duodenum and examined duodenum       were normal. Impression:           - LA Grade A reflux esophagitis.                       - Gastric mucosal atrophy.                       - Non-bleeding gastric ulcer with no stigmata of                        bleeding. Biopsied.                       - A single gastric polyp. Biopsied.                       - Normal duodenal bulb, second portion of the duodenum  and examined duodenum.                       - Biopsies were obtained in the gastric body, at the                        incisura and in the gastric antrum. Recommendation:       - Use Prilosec (omeprazole) 20 mg PO BID.                       - Stop Ranitidine                       - Stop NSAID use (like Ibuprofen, Aleeve, Motrin,                        Advil,  Meloxicam, Goodie Powder, BC powder etc.) except                        for Aspirin if medically indicated by PCP or cardiology                       - Await pathology results.                       - Discharge patient to home (with escort).                       - Advance diet as tolerated.                       - Continue present medications.                       - Patient has a contact number available for                        emergencies. The signs and symptoms of potential                        delayed complications were discussed with the patient.                        Return to normal activities tomorrow. Written discharge                        instructions were provided to the patient.                       - Discharge patient to home (with escort).                       - The findings and recommendations were discussed with                        the patient.                       - The findings and recommendations were discussed with                        the patient's family.                       -  Return to my office as previously scheduled.                       - Return to primary care physician as previously                        scheduled. Procedure Code(s):    --- Professional ---                       [phone], Esophagogastroduodenoscopy, flexible, transoral;                        with biopsy, single or multiple Diagnosis Code(s):    --- Professional ---                       K21.0, Gastro-esophageal reflux disease with esophagitis                       K31.89, Other diseases of stomach and duodenum                       K25.9, Gastric ulcer, unspecified as acute or chronic,                        without hemorrhage or perforation                       K31.7, Polyp of stomach and duodenum                       R10.13, Epigastric pain CPT copyright 2017 American Medical Association. All rights reserved. The codes documented in this report are preliminary and upon  coder review may  be revised to meet current compliance requirements.  [name], MD Margretta Sidle B. Bonna Gains MD, MD 04/13/2018 11:39:52 AM This report has been signed electronically. Number of Addenda: 0 Note Initiated On: 04/13/2018 11:13 AM Estimated Blood Loss: Estimated blood loss: none.      [facility]

## 2018-04-13 NOTE — H&P (Signed)
Olivia Antigua, MD [address], [place], [place], [place], [address] [address], [place], [place], [place], [address] Phone: [phone]  Fax: [phone]  Primary Care Physician:  Steele Sizer, MD   Pre-Procedure History & Physical: HPI:  [name] is a 57 y.o. female is here for an EGD.   Past Medical History:  Diagnosis Date  . Contact dermatitis and other eczema, due to unspecified cause   . COPD (chronic obstructive pulmonary disease) ([place])   . Depressive disorder   . Dysmetabolic syndrome X   . Hyperlipidemia   . Hypertension   . IBS (irritable bowel syndrome)   . Insomnia   . Lumbago   . Nonspecific abnormal electrocardiogram (ECG) (EKG)   . Other ovarian failure(256.39)   . Postmenopausal atrophic vaginitis   . Symptomatic menopausal or female climacteric states   . Unspecified vitamin D deficiency     Past Surgical History:  Procedure Laterality Date  . BREAST BIOPSY Left    neg  . BREAST LUMPECTOMY    . CERVICAL DISCECTOMY     x 2  . CHOLECYSTECTOMY    . DILATATION & CURETTAGE/HYSTEROSCOPY WITH MYOSURE N/A 03/21/2015   Procedure: DILATATION & CURETTAGE/HYSTEROSCOPY;  Surgeon: Aletha Halim, MD;  Location: [facility] ORS;  Service: Gynecology;  Laterality: N/A;  . DILATION AND CURETTAGE OF UTERUS    . ENDOMETRIAL ABLATION    . SPINAL FUSION    . TONSILLECTOMY    . TUBAL LIGATION      Prior to Admission medications   Medication Sig Start Date End Date Taking? Authorizing Provider  acetaminophen (TYLENOL) 500 MG tablet Take 1 tablet (500 mg total) by mouth every 6 (six) hours as needed. 02/19/16   Steele Sizer, MD  amitriptyline (ELAVIL) 10 MG tablet Take 1 tablet (10 mg total) by mouth at bedtime. 01/19/18   Steele Sizer, MD  benzonatate (TESSALON) 100 MG capsule Take 1-2 capsules (100-200 mg total) by mouth 3 (three) times daily as needed. Patient not taking: Reported on 04/04/2018 01/19/18   Steele Sizer, MD  clindamycin (CLEOCIN)  300 MG capsule TAKE ONE CAPSULE BY MOUTH EVERY 6 HOURS UNTIL GONE 01/03/18   [provider]  diclofenac (VOLTAREN) 75 MG EC tablet diclofenac sodium 75 mg tablet,delayed release  TAKE ONE TABLET BY MOUTH TWICE DAILY 02/25/18   [provider]  EVZIO 0.4 MG/0.4ML SOAJ  08/05/15   [provider]  famotidine (PEPCID) 40 MG tablet Take 1 tablet (40 mg total) by mouth every evening. 03/30/18 03/30/19  Loney Hering, MD  fluticasone furoate-vilanterol (BREO ELLIPTA) 100-25 MCG/INH AEPB Inhale 1 puff into the lungs daily. 01/19/18   Steele Sizer, MD  gabapentin (NEURONTIN) 300 MG capsule Take 1 capsule by mouth every 12 (twelve) hours. 05/07/15   [provider]  lidocaine (LIDODERM) 5 % Place 1 patch onto the skin daily. Remove & Discard patch within 12 hours or as directed by MD 10/21/17   Steele Sizer, MD  losartan-hydrochlorothiazide (HYZAAR) 100-25 MG tablet Take 1 tablet by mouth daily. 01/19/18   Steele Sizer, MD  meloxicam (MOBIC) 7.5 MG tablet Take 1 tablet (7.5 mg total) by mouth daily. Patient not taking: Reported on 04/04/2018 10/21/17   Steele Sizer, MD  metFORMIN (GLUCOPHAGE) 500 MG tablet Take 1 tablet (500 mg total) by mouth daily with breakfast. 10/21/17   Steele Sizer, MD  oxyCODONE (ROXICODONE) 15 MG immediate release tablet Take 1 tablet by mouth every 4 (four) hours. 05/07/15   [provider]  PRISTIQ 50 MG 24 hr tablet Take 1 tablet (50 mg total) by mouth daily. 10/21/17   Steele Sizer, MD  QUEtiapine (SEROQUEL) 25 MG tablet Take 1 tablet (25 mg total) by mouth at bedtime. 10/21/17   Steele Sizer, MD  ranitidine (ZANTAC) 300 MG tablet TAKE ONE TABLET BY MOUTH DAILY AS NEEDED HEARTBURN 10/21/17   Ancil Boozer, Drue Stager, MD  sucralfate (CARAFATE) 1 g tablet Take 1 tablet (1 g total) by mouth 2 (two) times daily. Patient not taking: Reported on 04/04/2018 03/30/18   Loney Hering, MD  tiZANidine (ZANAFLEX) 4 MG tablet Take 1 tablet  by mouth every 12 (twelve) hours. 05/07/15   [provider]  triamcinolone (KENALOG) 0.025 % cream Apply topically 2 (two) times daily. 05/29/16   Steele Sizer, MD    Allergies as of 04/05/2018  . (No Known Allergies)    Family History  Problem Relation Age of Onset  . Heart disease Mother   . Thyroid cancer Mother   . Depression Daughter   . Asthma Daughter     Social History   Socioeconomic History  . Marital status: Married    Spouse name: Not on file  . Number of children: 3  . Years of education: Not on file  . Highest education level: Not on file  Occupational History  . Occupation: disable     Comment: chronic back pain   Social Needs  . Financial resource strain: Not on file  . Food insecurity:    Worry: Not on file    Inability: Not on file  . Transportation needs:    Medical: Not on file    Non-medical: Not on file  Tobacco Use  . Smoking status: Current Every Day Smoker    Packs/day: 0.25    Years: 15.00    Pack years: 3.75    Types: Cigarettes    Start date: 07/26/2002  . Smokeless tobacco: Never Used  Substance and Sexual Activity  . Alcohol use: No    Alcohol/week: 0.0 oz  . Drug use: No  . Sexual activity: Yes    Partners: Male    Birth control/protection: Other-see comments    Comment: Ablation  Lifestyle  . Physical activity:    Days per week: Not on file    Minutes per session: Not on file  . Stress: Not on file  Relationships  . Social connections:    Talks on phone: Not on file    Gets together: Not on file    Attends religious service: Not on file    Active member of club or organization: Not on file    Attends meetings of clubs or organizations: Not on file    Relationship status: Not on file  . Intimate partner violence:    Fear of current or ex partner: Not on file    Emotionally abused: Not on file    Physically abused: Not on file    Forced sexual activity: Not on file  Other Topics Concern  . Not on file    Social History Narrative  . Not on file    Review of Systems: See HPI, otherwise negative ROS  Physical Exam: There were no vitals taken for this visit. General:   Alert,  pleasant and cooperative in NAD Head:  Normocephalic and atraumatic. Neck:  Supple; no masses or thyromegaly. Lungs:  Clear throughout to auscultation, normal respiratory effort.    Heart:  +S1, +S2, Regular rate and rhythm, No edema. Abdomen:  Soft, nontender and nondistended. Normal bowel sounds, without guarding, and without rebound.   Neurologic:  Alert and  oriented x4;  grossly normal neurologically.  Impression/Plan: [name] is here for an EGD for Abdominal pain  Risks, benefits, limitations, and alternatives regarding the procedure have been reviewed with the patient.  Questions have been answered.  All parties agreeable.   Virgel Manifold, MD  04/13/2018, 10:56 AM

## 2018-04-16 LAB — SURGICAL PATHOLOGY

## 2018-04-18 ENCOUNTER — Encounter: Payer: Self-pay | Admitting: Gastroenterology

## 2018-04-19 ENCOUNTER — Encounter: Payer: Self-pay | Admitting: Gastroenterology

## 2018-04-19 DIAGNOSIS — G5603 Carpal tunnel syndrome, bilateral upper limbs: Secondary | ICD-10-CM | POA: Diagnosis not present

## 2018-04-19 DIAGNOSIS — G5602 Carpal tunnel syndrome, left upper limb: Secondary | ICD-10-CM | POA: Diagnosis not present

## 2018-04-20 ENCOUNTER — Ambulatory Visit: Payer: Medicare Other | Admitting: Gastroenterology

## 2018-04-21 ENCOUNTER — Ambulatory Visit (INDEPENDENT_AMBULATORY_CARE_PROVIDER_SITE_OTHER): Payer: Medicare Other | Admitting: Family Medicine

## 2018-04-21 ENCOUNTER — Encounter: Payer: Self-pay | Admitting: Family Medicine

## 2018-04-21 VITALS — BP 118/64 | HR 91 | Temp 98.0°F | Resp 16 | Ht 69.0 in | Wt 198.3 lb

## 2018-04-21 DIAGNOSIS — K869 Disease of pancreas, unspecified: Secondary | ICD-10-CM | POA: Diagnosis not present

## 2018-04-21 DIAGNOSIS — I10 Essential (primary) hypertension: Secondary | ICD-10-CM

## 2018-04-21 DIAGNOSIS — E8881 Metabolic syndrome: Secondary | ICD-10-CM | POA: Diagnosis not present

## 2018-04-21 DIAGNOSIS — I700 Atherosclerosis of aorta: Secondary | ICD-10-CM

## 2018-04-21 DIAGNOSIS — K582 Mixed irritable bowel syndrome: Secondary | ICD-10-CM | POA: Diagnosis not present

## 2018-04-21 DIAGNOSIS — F5104 Psychophysiologic insomnia: Secondary | ICD-10-CM

## 2018-04-21 DIAGNOSIS — F329 Major depressive disorder, single episode, unspecified: Secondary | ICD-10-CM

## 2018-04-21 DIAGNOSIS — E88819 Insulin resistance, unspecified: Secondary | ICD-10-CM

## 2018-04-21 DIAGNOSIS — K253 Acute gastric ulcer without hemorrhage or perforation: Secondary | ICD-10-CM | POA: Diagnosis not present

## 2018-04-21 MED ORDER — ATORVASTATIN CALCIUM 40 MG PO TABS: 40.0000 mg | ORAL_TABLET | Freq: Every day | ORAL | 3 refills | Status: DC

## 2018-04-21 MED ORDER — PRISTIQ 50 MG PO TB24: 50.0000 mg | ORAL_TABLET | Freq: Every day | ORAL | 5 refills | Status: DC

## 2018-04-21 MED ORDER — LOSARTAN POTASSIUM-HCTZ 100-25 MG PO TABS: 1.0000 | ORAL_TABLET | Freq: Every day | ORAL | 5 refills | Status: DC

## 2018-04-21 MED ORDER — METFORMIN HCL 500 MG PO TABS: 500.0000 mg | ORAL_TABLET | Freq: Every day | ORAL | 5 refills | Status: DC

## 2018-04-21 MED ORDER — AMITRIPTYLINE HCL 10 MG PO TABS: 10.0000 mg | ORAL_TABLET | Freq: Every day | ORAL | 5 refills | Status: DC

## 2018-04-21 MED ORDER — QUETIAPINE FUMARATE 25 MG PO TABS: 25.0000 mg | ORAL_TABLET | Freq: Every day | ORAL | 5 refills | Status: DC

## 2018-04-21 NOTE — Progress Notes (Signed)
Name: [name]   MRN: [id number]    DOB: 03-10-1961   Date:04/21/2018       Progress Note  Subjective  Chief Complaint  Chief Complaint  Patient presents with  . Medication Refill  . COPD  . Hypertension    Denies any symptoms  . Depression  . Back Pain    Unchanged    HPI  Carpal tunnel: She was seen by Dr Barb Merino, currently seeing Dr. Sabra Heck and had steroid injections in both wrists and is buying her time until she must have surgery.    Metabolic syndrome: Lost 13 lbs past 6 months, but since last visit she gained 4 lbs back. No longer has diarrhea from Metformin, but only taking one pill daily.She denies polyphagia, polydipsia or polyuria. Discussed GLP-1 agonist, but she states her mother had a history of thyroid cancer.Continue life style modification. Discussed low HDL.Unchanged   HTN:bp is towards low end of normal, but denies any dizziness.No chest pain or shortness of breath; feet swelling has improved. She tries to avoid salt intake.   Bilateral Knee Osteoarthritis: Used to see  Dr. Rudene Christians on 11/25/2016 but is now under the care of Dr. Sabra Heck.  Was prescribed NSAID's but had to stop because gastritis and epigastric pain   Depression Major:Mother passed away in November 09, 2016 - had CHF, pacemaker/defib and died of an MI - pt was an only. She states she is doing better now, excited about grand-daughter on her way . She is taking medication, she does not want referral to Psychiatrist.   COPD:She is still smokingand is up to half pack daily. She is not ready to quit at this tim, but discussed importance of quitting, she denies cough, wheezing or SOB. She stopped all inhalers.She does not want medications at this time  Chronic back pain: she goes to [facility] in [place] and the pain has been stable at around 6/10 on her back, but can get higher. Pain management prescribe  gabapentin, oxycodone, and tizanidine. She has Evizio ( narcan) .Symptoms  stable  Gastritis: had a recent EGD, seeing GI and is off NSAID and taking Omeprazole twice daily and is feeling better, still has epigastric pain but not as severe now  IBS: doing better, symptoms not as frequent, continue with medication  Patient Active Problem List   Diagnosis Date Noted  . Reflux esophagitis   . Stomach irritation   . Acute peptic ulcer of stomach   . Gastric polyp   . Abdominal pain, epigastric   . Carpal tunnel syndrome on both sides 10/21/2017  . CRP elevated 08/09/2017  . Elevated C-reactive protein 07/28/2017  . Osteoarthritis of both knees 02/19/2016  . COPD, mild ([place]) 05/08/2015  . Vitamin D deficiency 05/07/2015  . Metabolic syndrome 68/34/1962  . Eczema 05/07/2015  . Chronic insomnia 05/07/2015  . Hypertension, benign 05/07/2015  . Dyslipidemia 05/07/2015  . Hyperglycemia 05/07/2015  . Chronic low back pain 05/07/2015  . IBS (irritable bowel syndrome) 05/07/2015  . Gastroesophageal reflux disease without esophagitis 05/07/2015  . Major depression, chronic ([place]) 05/07/2015  . Menopausal syndrome (hot flashes) 05/07/2015  . History of postmenopausal bleeding 01/14/2015    Past Surgical History:  Procedure Laterality Date  . BREAST BIOPSY Left    neg  . BREAST LUMPECTOMY    . CERVICAL DISCECTOMY     x 2  . CHOLECYSTECTOMY    . DILATATION & CURETTAGE/HYSTEROSCOPY WITH MYOSURE N/A 03/21/2015   Procedure: DILATATION & CURETTAGE/HYSTEROSCOPY;  Surgeon: Eduard Clos  Ilda Basset, MD;  Location: [facility] ORS;  Service: Gynecology;  Laterality: N/A;  . DILATION AND CURETTAGE OF UTERUS    . ENDOMETRIAL ABLATION    . ESOPHAGOGASTRODUODENOSCOPY (EGD) WITH PROPOFOL N/A 04/13/2018   Procedure: ESOPHAGOGASTRODUODENOSCOPY (EGD) WITH PROPOFOL;  Surgeon: Virgel Manifold, MD;  Location: [facility] ENDOSCOPY;  Service: Endoscopy;  Laterality: N/A;  . SPINAL FUSION    . TONSILLECTOMY    . TUBAL LIGATION      Family History  Problem Relation Age of Onset  . Heart disease  Mother   . Thyroid cancer Mother   . Depression Daughter   . Asthma Daughter     Social History   Socioeconomic History  . Marital status: Married    Spouse name: Not on file  . Number of children: 3  . Years of education: Not on file  . Highest education level: Not on file  Occupational History  . Occupation: disable     Comment: chronic back pain   Social Needs  . Financial resource strain: Not on file  . Food insecurity:    Worry: Not on file    Inability: Not on file  . Transportation needs:    Medical: Not on file    Non-medical: Not on file  Tobacco Use  . Smoking status: Current Every Day Smoker    Packs/day: 0.50    Years: 20.00    Pack years: 10.00    Types: Cigarettes    Start date: 07/26/2002  . Smokeless tobacco: Never Used  Substance and Sexual Activity  . Alcohol use: No    Alcohol/week: 0.0 oz  . Drug use: No  . Sexual activity: Yes    Partners: Male    Birth control/protection: Other-see comments    Comment: Ablation  Lifestyle  . Physical activity:    Days per week: Not on file    Minutes per session: Not on file  . Stress: Not on file  Relationships  . Social connections:    Talks on phone: Not on file    Gets together: Not on file    Attends religious service: Not on file    Active member of club or organization: Not on file    Attends meetings of clubs or organizations: Not on file    Relationship status: Not on file  . Intimate partner violence:    Fear of current or ex partner: Not on file    Emotionally abused: Not on file    Physically abused: Not on file    Forced sexual activity: Not on file  Other Topics Concern  . Not on file  Social History Narrative  . Not on file     Current Outpatient Medications:  .  acetaminophen (TYLENOL) 500 MG tablet, Take 1 tablet (500 mg total) by mouth every 6 (six) hours as needed., Disp: 30 tablet, Rfl: 2 .  amitriptyline (ELAVIL) 10 MG tablet, Take 1 tablet (10 mg total) by mouth at  bedtime., Disp: 30 tablet, Rfl: 2 .  gabapentin (NEURONTIN) 300 MG capsule, Take 1 capsule by mouth every 12 (twelve) hours., Disp: , Rfl:  .  lidocaine (LIDODERM) 5 %, Place 1 patch onto the skin daily. Remove & Discard patch within 12 hours or as directed by MD, Disp: 30 patch, Rfl: 0 .  losartan-hydrochlorothiazide (HYZAAR) 100-25 MG tablet, Take 1 tablet by mouth daily., Disp: 30 tablet, Rfl: 2 .  metFORMIN (GLUCOPHAGE) 500 MG tablet, Take 1 tablet (500 mg total) by mouth daily with breakfast.,  Disp: 30 tablet, Rfl: 5 .  omeprazole (PRILOSEC) 20 MG capsule, Take 1 capsule (20 mg total) by mouth 2 (two) times daily before a meal., Disp: 30 capsule, Rfl: 1 .  oxyCODONE (ROXICODONE) 15 MG immediate release tablet, Take 1 tablet by mouth every 4 (four) hours., Disp: , Rfl:  .  PRISTIQ 50 MG 24 hr tablet, Take 1 tablet (50 mg total) by mouth daily., Disp: 30 tablet, Rfl: 5 .  QUEtiapine (SEROQUEL) 25 MG tablet, Take 1 tablet (25 mg total) by mouth at bedtime., Disp: 30 tablet, Rfl: 5 .  tiZANidine (ZANAFLEX) 4 MG tablet, Take 1 tablet by mouth every 12 (twelve) hours., Disp: , Rfl:  .  EVZIO 0.4 MG/0.4ML SOAJ, , Disp: , Rfl:   No Known Allergies   ROS  Constitutional: Negative for fever or weight change.  Respiratory: Negative for cough and shortness of breath.   Cardiovascular: Negative for chest pain or palpitations.  Gastrointestinal: positive  for abdominal pain, no bowel changes.  Musculoskeletal: Negative for gait problem or joint swelling.  Skin: Negative for rash.  Neurological: Negative for dizziness , positive for intermittent  headache.  No other specific complaints in a complete review of systems (except as listed in HPI above).  Objective  Vitals:   04/21/18 1030  BP: 118/64  Pulse: 91  Resp: 16  Temp: 98 F (36.7 C)  TempSrc: Oral  SpO2: 96%  Weight: 198 lb 4.8 oz (89.9 kg)  Height: '5\' 9"'$  (1.753 m)    Body mass index is 29.28 kg/m.  Physical  Exam  Constitutional: Patient appears well-developed and well-nourished. Overweight  No distress.  HEENT: head atraumatic, normocephalic, pupils equal and reactive to light, neck supple, throat within normal limits Cardiovascular: Normal rate, regular rhythm and normal heart sounds.  No murmur heard. No BLE edema. Pulmonary/Chest: Effort normal and breath sounds normal. No respiratory distress. Abdominal: Soft.  There is epigastric tenderness. Psychiatric: Patient has a normal mood and affect. behavior is normal. Judgment and thought content normal.  Recent Results (from the past 2160 hour(s))  Basic metabolic panel     Status: Abnormal   Collection Time: 03/29/18 11:30 PM  Result Value Ref Range   Sodium 139 135 - 145 mmol/L   Potassium 3.7 3.5 - 5.1 mmol/L   Chloride 103 101 - 111 mmol/L   CO2 25 22 - 32 mmol/L   Glucose, Bld 102 (H) 65 - 99 mg/dL   BUN 13 6 - 20 mg/dL   Creatinine, Ser 0.77 0.44 - 1.00 mg/dL   Calcium 9.5 8.9 - 10.3 mg/dL   GFR calc non Af Amer >60 >60 mL/min   GFR calc Af Amer >60 >60 mL/min    Comment: (NOTE) The eGFR has been calculated using the CKD EPI equation. This calculation has not been validated in all clinical situations. eGFR's persistently <60 mL/min signify possible Chronic Kidney Disease.    Anion gap 11 5 - 15    Comment: Performed at [facility], [address] [place], [place] [address]  CBC     Status: None   Collection Time: 03/29/18 11:30 PM  Result Value Ref Range   WBC 9.2 3.6 - 11.0 K/uL   RBC 4.05 3.80 - 5.20 MIL/uL   Hemoglobin 13.1 12.0 - 16.0 g/dL   HCT 37.8 35.0 - 47.0 %   MCV 93.3 80.0 - 100.0 fL   MCH 32.2 26.0 - 34.0 pg   MCHC 34.5 32.0 - 36.0 g/dL  RDW 12.5 11.5 - 14.5 %   Platelets 261 150 - 440 K/uL    Comment: Performed at [facility] [address] [place], [place] [address]  Urinalysis, Complete w Microscopic     Status: Abnormal   Collection Time: 03/29/18 11:30 PM  Result Value Ref  Range   Color, Urine YELLOW (A) YELLOW   APPearance CLEAR (A) CLEAR   Specific Gravity, Urine 1.026 1.005 - 1.030   pH 5.0 5.0 - 8.0   Glucose, UA NEGATIVE NEGATIVE mg/dL   Hgb urine dipstick NEGATIVE NEGATIVE   Bilirubin Urine NEGATIVE NEGATIVE   Ketones, ur NEGATIVE NEGATIVE mg/dL   Protein, ur NEGATIVE NEGATIVE mg/dL   Nitrite NEGATIVE NEGATIVE   Leukocytes, UA NEGATIVE NEGATIVE   RBC / HPF 0-5 0 - 5 RBC/hpf   WBC, UA 0-5 0 - 5 WBC/hpf   Bacteria, UA RARE (A) NONE SEEN   Squamous Epithelial / LPF 0-5 0 - 5   Mucus PRESENT     Comment: Performed at [facility], [address] [place], [place] [address]  Surgical pathology     Status: None   Collection Time: 04/13/18 11:23 AM  Result Value Ref Range   SURGICAL PATHOLOGY      Surgical Pathology CASE: [id number] PATIENT: Chana Bode Surgical Pathology Report     SPECIMEN SUBMITTED: A. Stomach, atrophic, r/o h pylori; cbx B. Stomach ulcer; cbx C. Stomach polyp; cbx  CLINICAL HISTORY: None provided  PRE-OPERATIVE DIAGNOSIS: Abdominal pain  POST-OPERATIVE DIAGNOSIS: Gastric ulcer, gastric polyp     DIAGNOSIS: A.  STOMACH, ATROPHIC; COLD BIOPSY: - ANTRAL MUCOSA WITH MILD REACTIVE GASTROPATHY. - OXYNTIC MUCOSA WITHOUT PATHOLOGIC CHANGES. - NEGATIVE FOR ATROPHY, H. PYLORI, INTESTINAL METAPLASIA, DYSPLASIA, AND MALIGNANCY.  B.  STOMACH ULCER; COLD BIOPSY: - ANTRAL MUCOSA WITH MODERATE REACTIVE GASTROPATHY AND CHANGES SUGGESTIVE OF HEALING MUCOSAL INJURY. - NEGATIVE FOR ACTIVE INFLAMMATION, H. PYLORI, INTESTINAL METAPLASIA, DYSPLASIA, AND MALIGNANCY.  C.  STOMACH POLYP; COLD BIOPSY: - OXYNTIC MUCOSA WITH VERY MILD CHRONIC INACTIVE GASTRITIS. - NEGATIVE FOR ATROPHY, H. PYLORI, INTESTINAL METAPLASIA, DYSPLASIA, AND MALIGNA NCY.  Comment: In part C, there are no definite features to account for the appearance of a polyp. The sample does not include submucosal tissue, so a deeper process cannot  be excluded.   GROSS DESCRIPTION: A. Labeled: Cbx gastric atrophic rule out H. pylori Received: In formalin Tissue fragment(s): 6 3 Size: 0.3 and 0.5 cm Description: Tan fragments Entirely submitted in one cassette.  B. Labeled: Cbx gastric ulcer Received: In formalin Tissue fragment(s): 5 Size: Less than 0.1-0.3 cm Description: Pink-red fragments Entirely submitted in one cassette.  C. Labeled: Cbx gastric polyp Received: In formalin Tissue fragment(s): 1 Size: 0.5 cm Description: Pink fragment Entirely submitted in one cassette.   Final Diagnosis performed by Bryan Lemma, MD.   Electronically signed 04/16/2018 12:54:33PM The electronic signature indicates that the named Attending Pathologist has evaluated the specimen  Technical component performed at [facility], [address] [place], [place] [address] Lab: [id number] Dir: Rush Farmer, MD, MMM  Professional component performed at [facility], [facility], [place], [place], [place] [address] Lab: [phone] Dir: Dellia Nims. Rubinas, MD       PHQ2/9: Depression screen [facility] 2/9 04/21/2018 01/19/2018 10/21/2017 07/26/2017 06/02/2017  Decreased Interest 0 0 0 1 1  Down, Depressed, Hopeless 1 0 '1 2 1  '$ PHQ - 2 Score 1 0 '1 3 2  '$ Altered sleeping 1 1 - 1 1  Tired, decreased  energy 0 1 - 0 0  Change in appetite 0 0 - 3 1  Feeling bad or failure about yourself  0 0 - 1 0  Trouble concentrating 0 0 - 0 0  Moving slowly or fidgety/restless 0 0 - 0 0  Suicidal thoughts 0 0 - 0 0  PHQ-9 Score 2 2 - 8 4  Difficult doing work/chores Not difficult at all Not difficult at all - Not difficult at all -     Fall Risk: Fall Risk  04/21/2018 01/19/2018 10/21/2017 07/26/2017 11/27/2016  Falls in the past year? No No No No No  Number falls in past yr: - - - - -  Injury with Fall? - - - - -     Functional Status Survey: Is the patient deaf or have difficulty hearing?: No Does the patient have difficulty seeing,  even when wearing glasses/contacts?: No Does the patient have difficulty concentrating, remembering, or making decisions?: No Does the patient have difficulty walking or climbing stairs?: No Does the patient have difficulty dressing or bathing?: No Does the patient have difficulty doing errands alone such as visiting a doctor's office or shopping?: No   Assessment & Plan  1. Irritable bowel syndrome with both constipation and diarrhea  - amitriptyline (ELAVIL) 10 MG tablet; Take 1 tablet (10 mg total) by mouth at bedtime.  Dispense: 30 tablet; Refill: 5  2. Insulin resistance  - metFORMIN (GLUCOPHAGE) 500 MG tablet; Take 1 tablet (500 mg total) by mouth daily with breakfast.  Dispense: 30 tablet; Refill: 5  3. Metabolic syndrome  - metFORMIN (GLUCOPHAGE) 500 MG tablet; Take 1 tablet (500 mg total) by mouth daily with breakfast.  Dispense: 30 tablet; Refill: 5  4. Hypertension, benign  - losartan-hydrochlorothiazide (HYZAAR) 100-25 MG tablet; Take 1 tablet by mouth daily.  Dispense: 30 tablet; Refill: 5  5. Major depression, chronic  - PRISTIQ 50 MG 24 hr tablet; Take 1 tablet (50 mg total) by mouth daily.  Dispense: 30 tablet; Refill: 5 - QUEtiapine (SEROQUEL) 25 MG tablet; Take 1 tablet (25 mg total) by mouth at bedtime.  Dispense: 30 tablet; Refill: 5  6. Chronic insomnia  - QUEtiapine (SEROQUEL) 25 MG tablet; Take 1 tablet (25 mg total) by mouth at bedtime.  Dispense: 30 tablet; Refill: 5  7. Acute peptic ulcer of stomach  Seeing GI, states symptoms with medication   8. Pancreatic lesion  Monitored by Dr. Bonna Gains   9. Abdominal aortic atherosclerosis (HCC)  - atorvastatin (LIPITOR) 40 MG tablet; Take 1 tablet (40 mg total) by mouth daily.  Dispense: 90 tablet; Refill: 3

## 2018-05-06 ENCOUNTER — Other Ambulatory Visit: Payer: Self-pay | Admitting: Family Medicine

## 2018-05-06 DIAGNOSIS — F329 Major depressive disorder, single episode, unspecified: Secondary | ICD-10-CM

## 2018-05-06 NOTE — Telephone Encounter (Signed)
Copied from [place] [id number]. Topic: Quick Communication - See Telephone Encounter >> May 06, 2018  1:58 PM Hewitt Shorts wrote: Pt is needing a refill on pristiq   [facility]  Best number [phone]  Pt is completely out

## 2018-05-09 MED ORDER — PRISTIQ 50 MG PO TB24: 50.0000 mg | ORAL_TABLET | Freq: Every day | ORAL | 5 refills | Status: DC

## 2018-05-09 NOTE — Telephone Encounter (Signed)
Patient states the pharmacy is supposed to be sending over paperwork for this prescription as well. gr

## 2018-06-09 DIAGNOSIS — M25552 Pain in left hip: Secondary | ICD-10-CM | POA: Diagnosis not present

## 2018-06-09 DIAGNOSIS — M545 Low back pain: Secondary | ICD-10-CM | POA: Diagnosis not present

## 2018-06-09 DIAGNOSIS — M25512 Pain in left shoulder: Secondary | ICD-10-CM | POA: Diagnosis not present

## 2018-06-09 DIAGNOSIS — M25561 Pain in right knee: Secondary | ICD-10-CM | POA: Diagnosis not present

## 2018-06-09 DIAGNOSIS — M25562 Pain in left knee: Secondary | ICD-10-CM | POA: Diagnosis not present

## 2018-06-09 DIAGNOSIS — M542 Cervicalgia: Secondary | ICD-10-CM | POA: Diagnosis not present

## 2018-06-09 DIAGNOSIS — M79661 Pain in right lower leg: Secondary | ICD-10-CM | POA: Diagnosis not present

## 2018-06-09 DIAGNOSIS — G894 Chronic pain syndrome: Secondary | ICD-10-CM | POA: Diagnosis not present

## 2018-06-09 DIAGNOSIS — M79662 Pain in left lower leg: Secondary | ICD-10-CM | POA: Diagnosis not present

## 2018-06-09 DIAGNOSIS — M25511 Pain in right shoulder: Secondary | ICD-10-CM | POA: Diagnosis not present

## 2018-06-09 DIAGNOSIS — M25551 Pain in right hip: Secondary | ICD-10-CM | POA: Diagnosis not present

## 2018-06-09 DIAGNOSIS — M5432 Sciatica, left side: Secondary | ICD-10-CM | POA: Diagnosis not present

## 2018-07-11 ENCOUNTER — Ambulatory Visit: Payer: Medicare Other | Admitting: Gastroenterology

## 2018-07-11 DIAGNOSIS — M79661 Pain in right lower leg: Secondary | ICD-10-CM | POA: Diagnosis not present

## 2018-07-11 DIAGNOSIS — G894 Chronic pain syndrome: Secondary | ICD-10-CM | POA: Diagnosis not present

## 2018-07-11 DIAGNOSIS — M545 Low back pain: Secondary | ICD-10-CM | POA: Diagnosis not present

## 2018-07-11 DIAGNOSIS — M25512 Pain in left shoulder: Secondary | ICD-10-CM | POA: Diagnosis not present

## 2018-07-11 DIAGNOSIS — M25561 Pain in right knee: Secondary | ICD-10-CM | POA: Diagnosis not present

## 2018-07-11 DIAGNOSIS — M79662 Pain in left lower leg: Secondary | ICD-10-CM | POA: Diagnosis not present

## 2018-07-11 DIAGNOSIS — M542 Cervicalgia: Secondary | ICD-10-CM | POA: Diagnosis not present

## 2018-07-11 DIAGNOSIS — M25552 Pain in left hip: Secondary | ICD-10-CM | POA: Diagnosis not present

## 2018-07-11 DIAGNOSIS — M25511 Pain in right shoulder: Secondary | ICD-10-CM | POA: Diagnosis not present

## 2018-07-11 DIAGNOSIS — M25562 Pain in left knee: Secondary | ICD-10-CM | POA: Diagnosis not present

## 2018-07-13 ENCOUNTER — Encounter: Payer: Self-pay | Admitting: Gastroenterology

## 2018-07-13 ENCOUNTER — Ambulatory Visit (INDEPENDENT_AMBULATORY_CARE_PROVIDER_SITE_OTHER): Payer: Medicare Other | Admitting: Gastroenterology

## 2018-07-13 VITALS — BP 109/68 | HR 73 | Ht 69.0 in | Wt 202.0 lb

## 2018-07-13 DIAGNOSIS — K259 Gastric ulcer, unspecified as acute or chronic, without hemorrhage or perforation: Secondary | ICD-10-CM | POA: Diagnosis not present

## 2018-07-13 NOTE — Progress Notes (Signed)
[name], MD [address]  [place]  [place], [place] [address]  Main: [phone]  Fax: [phone]   Primary Care Physician: Steele Sizer, MD  Primary Gastroenterologist:  Dr. [name]  Chief Complaint  Patient presents with  . Follow-up    abdominal pain    HPI: [name] is a 57 y.o. female here for follow-up of gastric ulcer and abdominal pain.  Abdominal pain is better with PPI.  Patient takes it twice daily, but does not take it 30 minutes before food.  Pain is left upper quadrant, cramping, dull, nonradiating, 2/10, intermittent.  Has stopped taking NSAIDs since last clinic visit.  Denies any NSAID use whatsoever.  Takes Tylenol for aches and pains.  No melena or hematochezia.  EGD June 2019 showed grade a esophagitis, gastric mucosal atrophy, nonbleeding gastric ulcer in the antrum, single gastric polyp.  Biopsies were negative for H. pylori.  Stomach polyp showed mild chronic inactive gastritis.    Current Outpatient Medications  Medication Sig Dispense Refill  . acetaminophen (TYLENOL) 500 MG tablet Take 1 tablet (500 mg total) by mouth every 6 (six) hours as needed. 30 tablet 2  . amitriptyline (ELAVIL) 10 MG tablet Take 1 tablet (10 mg total) by mouth at bedtime. 30 tablet 5  . atorvastatin (LIPITOR) 40 MG tablet Take 1 tablet (40 mg total) by mouth daily. 90 tablet 3  . gabapentin (NEURONTIN) 300 MG capsule Take 1 capsule by mouth every 12 (twelve) hours.    . lidocaine (LIDODERM) 5 % Place 1 patch onto the skin daily. Remove & Discard patch within 12 hours or as directed by MD 30 patch 0  . losartan-hydrochlorothiazide (HYZAAR) 100-25 MG tablet Take 1 tablet by mouth daily. 30 tablet 5  . metFORMIN (GLUCOPHAGE) 500 MG tablet Take 1 tablet (500 mg total) by mouth daily with breakfast. 30 tablet 5  . oxyCODONE (ROXICODONE) 15 MG immediate release tablet Take 1 tablet by mouth every 4 (four) hours.    Marland Kitchen PRISTIQ 50 MG 24 hr tablet  Take 1 tablet (50 mg total) by mouth daily. 30 tablet 5  . QUEtiapine (SEROQUEL) 25 MG tablet Take 1 tablet (25 mg total) by mouth at bedtime. 30 tablet 5  . tiZANidine (ZANAFLEX) 4 MG tablet Take 1 tablet by mouth every 12 (twelve) hours.    Marland Kitchen EVZIO 0.4 MG/0.4ML SOAJ     . omeprazole (PRILOSEC) 20 MG capsule Take 1 capsule (20 mg total) by mouth 2 (two) times daily before a meal. 30 capsule 1   No current facility-administered medications for this visit.     Allergies as of 07/13/2018  . (No Known Allergies)    ROS:  General: Negative for anorexia, weight loss, fever, chills, fatigue, weakness. ENT: Negative for hoarseness, difficulty swallowing , nasal congestion. CV: Negative for chest pain, angina, palpitations, dyspnea on exertion, peripheral edema.  Respiratory: Negative for dyspnea at rest, dyspnea on exertion, cough, sputum, wheezing.  GI: See history of present illness. GU:  Negative for dysuria, hematuria, urinary incontinence, urinary frequency, nocturnal urination.  Endo: Negative for unusual weight change.    Physical Examination:   BP 109/68   Pulse 73   Ht 5\' 9"  (1.753 m)   Wt 202 lb (91.6 kg)   BMI 29.83 kg/m   General: Well-nourished, well-developed in no acute distress.  Eyes: No icterus. Conjunctivae pink. Mouth: Oropharyngeal mucosa moist and pink , no lesions erythema or exudate. Neck: Supple, Trachea midline Abdomen: Bowel sounds  are normal, nontender, nondistended, no hepatosplenomegaly or masses, no abdominal bruits or hernia , no rebound or guarding.   Extremities: No lower extremity edema. No clubbing or deformities. Neuro: Alert and oriented x 3.  Grossly intact. Skin: Warm and dry, no jaundice.   Psych: Alert and cooperative, normal mood and affect.   Labs: CMP     Component Value Date/Time   NA 139 03/29/2018 2330   K 3.7 03/29/2018 2330   CL 103 03/29/2018 2330   CO2 25 03/29/2018 2330   GLUCOSE 102 (H) 03/29/2018 2330   BUN 13  03/29/2018 2330   CREATININE 0.77 03/29/2018 2330   CREATININE 0.66 01/19/2018 1042   CALCIUM 9.5 03/29/2018 2330   PROT 8.1 01/19/2018 1042   ALBUMIN 3.7 03/08/2017 1341   AST 22 01/19/2018 1042   ALT 30 (H) 01/19/2018 1042   ALKPHOS 71 03/08/2017 1341   BILITOT 0.5 01/19/2018 1042   GFRNONAA >60 03/29/2018 2330   GFRNONAA 99 01/19/2018 1042   GFRAA >60 03/29/2018 2330   GFRAA 115 01/19/2018 1042   Lab Results  Component Value Date   WBC 9.2 03/29/2018   HGB 13.1 03/29/2018   HCT 37.8 03/29/2018   MCV 93.3 03/29/2018   PLT 261 03/29/2018    Imaging Studies: No results found.  Assessment and Plan:   [name] is a 57 y.o. y/o female here for follow-up of abdominal pain and gastric ulcer  Abdominal pain better with PPI treatment of gastric ulcer Continue to avoid NSAIDs Abdominal pain has not completely resolved  Abdominal pain may also be due to her alternating constipation and diarrhea.  States she has weeks where she has loose stools, and then followed by constipation.  We will repeat EGD to ensure gastric ulcer has healed and patient is agreeable with this plan She would also like a screening colonoscopy done at the same time as her EGD Due to her alternating loose stools, can also obtain biopsies of the colon to rule out microscopic colitis  Symptoms not consistent with infection however, given chronic nature of the symptoms  Colonoscopy otherwise normal, can try Metamucil and MiraLAX to regulate bowel movements  I have discussed alternative options, risks & benefits,  which include, but are not limited to, bleeding, infection, perforation,respiratory complication & drug reaction.  The patient agrees with this plan & written consent will be obtained.    (Risks of PPI use were discussed with patient including bone loss, C. Diff diarrhea, pneumonia, infections, CKD, electrolyte abnormalities.  If clinically possible based on symptoms, goal would be to maintain  patient on the lowest dose possible, or discontinue the medication with institution of acid reflux lifestyle modifications over time. Pt. Verbalizes understanding and chooses to continue the medication.)    Dr [name]

## 2018-07-15 ENCOUNTER — Other Ambulatory Visit: Payer: Self-pay

## 2018-07-15 MED ORDER — OMEPRAZOLE 20 MG PO CPDR: 20.0000 mg | DELAYED_RELEASE_CAPSULE | Freq: Two times a day (BID) | ORAL | 0 refills | Status: DC

## 2018-07-18 DIAGNOSIS — M170 Bilateral primary osteoarthritis of knee: Secondary | ICD-10-CM | POA: Diagnosis not present

## 2018-07-18 DIAGNOSIS — G5603 Carpal tunnel syndrome, bilateral upper limbs: Secondary | ICD-10-CM | POA: Diagnosis not present

## 2018-07-18 DIAGNOSIS — M65311 Trigger thumb, right thumb: Secondary | ICD-10-CM | POA: Diagnosis not present

## 2018-07-18 DIAGNOSIS — M1711 Unilateral primary osteoarthritis, right knee: Secondary | ICD-10-CM | POA: Diagnosis not present

## 2018-07-28 ENCOUNTER — Ambulatory Visit: Payer: Medicare Other

## 2018-07-28 ENCOUNTER — Ambulatory Visit (INDEPENDENT_AMBULATORY_CARE_PROVIDER_SITE_OTHER): Payer: Medicare Other

## 2018-07-28 VITALS — BP 128/72 | HR 76 | Temp 98.0°F | Ht 69.0 in | Wt 199.8 lb

## 2018-07-28 DIAGNOSIS — Z1231 Encounter for screening mammogram for malignant neoplasm of breast: Secondary | ICD-10-CM

## 2018-07-28 DIAGNOSIS — Z0000 Encounter for general adult medical examination without abnormal findings: Secondary | ICD-10-CM | POA: Diagnosis not present

## 2018-07-28 DIAGNOSIS — Z23 Encounter for immunization: Secondary | ICD-10-CM

## 2018-07-28 DIAGNOSIS — Z1211 Encounter for screening for malignant neoplasm of colon: Secondary | ICD-10-CM | POA: Diagnosis not present

## 2018-07-28 DIAGNOSIS — Z1239 Encounter for other screening for malignant neoplasm of breast: Secondary | ICD-10-CM

## 2018-07-28 NOTE — Patient Instructions (Signed)
[name] , Thank you for taking time to come for your Medicare Wellness Visit. I appreciate your ongoing commitment to your health goals. Please review the following plan we discussed and let me know if I can assist you in the future.   Screening recommendations/referrals: Colorectal Screening: Up to date Mammogram: Please keep appointment as scheduled Bone Density: Not yet required  Vision and Dental Exams: Recommended annual ophthalmology exams for early detection of glaucoma and other disorders of the eye Recommended annual dental exams for proper oral hygiene  Vaccinations: Influenza vaccine: Completed today Pneumococcal vaccine: Not yet required Tdap vaccine: Up to date Shingles vaccine: Please call your insurance company to determine your out of pocket expense for the Shingrix vaccine. You may receive this vaccine at your local pharmacy.  Advanced directives: Advance directives discussed with you today. I have provided a copy for you to complete at home and have notarized. Once this is complete please bring a copy in to our office so we can scan it into your chart.  Goals: Recommend to continue efforts to reduce smoking habits until no longer smoking. Smoking Cessation literature is attached below.  Next appointment: Please schedule your Annual Wellness Visit with your Nurse Health Advisor in one year.  Preventive Care 40-64 Years, Female Preventive care refers to lifestyle choices and visits with your health care provider that can promote health and wellness. What does preventive care include?  A yearly physical exam. This is also called an annual well check.  Dental exams once or twice a year.  Routine eye exams. Ask your health care provider how often you should have your eyes checked.  Personal lifestyle choices, including:  Daily care of your teeth and gums.  Regular physical activity.  Eating a healthy diet.  Avoiding tobacco and drug use.  Limiting alcohol  use.  Practicing safe sex.  Taking low-dose aspirin daily starting at age 32 if recommended by your health care provider.  Taking vitamin and mineral supplements as recommended by your health care provider. What happens during an annual well check? The services and screenings done by your health care provider during your annual well check will depend on your age, overall health, lifestyle risk factors, and family history of disease. Counseling  Your health care provider may ask you questions about your:  Alcohol use.  Tobacco use.  Drug use.  Emotional well-being.  Home and relationship well-being.  Sexual activity.  Eating habits.  Work and work Statistician.  Method of birth control.  Menstrual cycle.  Pregnancy history. Screening  You may have the following tests or measurements:  Height, weight, and BMI.  Blood pressure.  Lipid and cholesterol levels. These may be checked every 5 years, or more frequently if you are over 21 years old.  Skin check.  Lung cancer screening. You may have this screening every year starting at age 80 if you have a 30-pack-year history of smoking and currently smoke or have quit within the past 15 years.  Fecal occult blood test (FOBT) of the stool. You may have this test every year starting at age 78.  Flexible sigmoidoscopy or colonoscopy. You may have a sigmoidoscopy every 5 years or a colonoscopy every 10 years starting at age 74.  Hepatitis C blood test.  Hepatitis B blood test.  Sexually transmitted disease (STD) testing.  Diabetes screening. This is done by checking your blood sugar (glucose) after you have not eaten for a while (fasting). You may have this done every 1-3  years.  Mammogram. This may be done every 1-2 years. Talk to your health care provider about when you should start having regular mammograms. This may depend on whether you have a family history of breast cancer.  BRCA-related cancer screening. This may  be done if you have a family history of breast, ovarian, tubal, or peritoneal cancers.  Pelvic exam and Pap test. This may be done every 3 years starting at age 59. Starting at age 55, this may be done every 5 years if you have a Pap test in combination with an HPV test.  Bone density scan. This is done to screen for osteoporosis. You may have this scan if you are at high risk for osteoporosis. Discuss your test results, treatment options, and if necessary, the need for more tests with your health care provider. Vaccines  Your health care provider may recommend certain vaccines, such as:  Influenza vaccine. This is recommended every year.  Tetanus, diphtheria, and acellular pertussis (Tdap, Td) vaccine. You may need a Td booster every 10 years.  Zoster vaccine. You may need this after age 65.  Pneumococcal 13-valent conjugate (PCV13) vaccine. You may need this if you have certain conditions and were not previously vaccinated.  Pneumococcal polysaccharide (PPSV23) vaccine. You may need one or two doses if you smoke cigarettes or if you have certain conditions. Talk to your health care provider about which screenings and vaccines you need and how often you need them. This information is not intended to replace advice given to you by your health care provider. Make sure you discuss any questions you have with your health care provider. Document Released: 11/15/2015 Document Revised: 07/08/2016 Document Reviewed: 08/20/2015 Elsevier Interactive Patient Education  2017 [employer] Prevention in the Home Falls can cause injuries. They can happen to people of all ages. There are many things you can do to make your home safe and to help prevent falls. What can I do on the outside of my home?  Regularly fix the edges of walkways and driveways and fix any cracks.  Remove anything that might make you trip as you walk through a door, such as a raised step or threshold.  Trim any  bushes or trees on the path to your home.  Use bright outdoor lighting.  Clear any walking paths of anything that might make someone trip, such as rocks or tools.  Regularly check to see if handrails are loose or broken. Make sure that both sides of any steps have handrails.  Any raised decks and porches should have guardrails on the edges.  Have any leaves, snow, or ice cleared regularly.  Use sand or salt on walking paths during winter.  Clean up any spills in your garage right away. This includes oil or grease spills. What can I do in the bathroom?  Use night lights.  Install grab bars by the toilet and in the tub and shower. Do not use towel bars as grab bars.  Use non-skid mats or decals in the tub or shower.  If you need to sit down in the shower, use a plastic, non-slip stool.  Keep the floor dry. Clean up any water that spills on the floor as soon as it happens.  Remove soap buildup in the tub or shower regularly.  Attach bath mats securely with double-sided non-slip rug tape.  Do not have throw rugs and other things on the floor that can make you trip. What can I do in  the bedroom?  Use night lights.  Make sure that you have a light by your bed that is easy to reach.  Do not use any sheets or blankets that are too big for your bed. They should not hang down onto the floor.  Have a firm chair that has side arms. You can use this for support while you get dressed.  Do not have throw rugs and other things on the floor that can make you trip. What can I do in the kitchen?  Clean up any spills right away.  Avoid walking on wet floors.  Keep items that you use a lot in easy-to-reach places.  If you need to reach something above you, use a strong step stool that has a grab bar.  Keep electrical cords out of the way.  Do not use floor polish or wax that makes floors slippery. If you must use wax, use non-skid floor wax.  Do not have throw rugs and other  things on the floor that can make you trip. What can I do with my stairs?  Do not leave any items on the stairs.  Make sure that there are handrails on both sides of the stairs and use them. Fix handrails that are broken or loose. Make sure that handrails are as long as the stairways.  Check any carpeting to make sure that it is firmly attached to the stairs. Fix any carpet that is loose or worn.  Avoid having throw rugs at the top or bottom of the stairs. If you do have throw rugs, attach them to the floor with carpet tape.  Make sure that you have a light switch at the top of the stairs and the bottom of the stairs. If you do not have them, ask someone to add them for you. What else can I do to help prevent falls?  Wear shoes that:  Do not have high heels.  Have rubber bottoms.  Are comfortable and fit you well.  Are closed at the toe. Do not wear sandals.  If you use a stepladder:  Make sure that it is fully opened. Do not climb a closed stepladder.  Make sure that both sides of the stepladder are locked into place.  Ask someone to hold it for you, if possible.  Clearly mark and make sure that you can see:  Any grab bars or handrails.  First and last steps.  Where the edge of each step is.  Use tools that help you move around (mobility aids) if they are needed. These include:  Canes.  Walkers.  Scooters.  Crutches.  Turn on the lights when you go into a dark area. Replace any light bulbs as soon as they burn out.  Set up your furniture so you have a clear path. Avoid moving your furniture around.  If any of your floors are uneven, fix them.  If there are any pets around you, be aware of where they are.  Review your medicines with your doctor. Some medicines can make you feel dizzy. This can increase your chance of falling. Ask your doctor what other things that you can do to help prevent falls. This information is not intended to replace advice given to  you by your health care provider. Make sure you discuss any questions you have with your health care provider. Document Released: 08/15/2009 Document Revised: 03/26/2016 Document Reviewed: 11/23/2014 Elsevier Interactive Patient Education  2017 [employer]  Smoking Tobacco Information Smoking tobacco will very likely  harm your health. Tobacco contains a poisonous (toxic), colorless chemical called nicotine. Nicotine affects the brain and makes tobacco addictive. This change in your brain can make it hard to stop smoking. Tobacco also has other toxic chemicals that can hurt your body and raise your risk of many cancers. How can smoking tobacco affect me? Smoking tobacco can increase your chances of having serious health conditions, such as:  Cancer. Smoking is most commonly associated with lung cancer, but can lead to cancer in other parts of the body.  Chronic obstructive pulmonary disease (COPD). This is a long-term lung condition that makes it hard to breathe. It also gets worse over time.  High blood pressure (hypertension), heart disease, stroke, or heart attack.  Lung infections, such as pneumonia.  Cataracts. This is when the lenses in the eyes become clouded.  Digestive problems. This may include peptic ulcers, heartburn, and gastroesophageal reflux disease (GERD).  Oral health problems, such as gum disease and tooth loss.  Loss of taste and smell.  Smoking can affect your appearance by causing:  Wrinkles.  Yellow or stained teeth, fingers, and fingernails.  Smoking tobacco can also affect your social life.  Many workplaces, [employer], hotels, and public places are tobacco-free. This means that you may experience challenges in finding places to smoke when away from home.  The cost of a smoking habit can be expensive. Expenses for someone who smokes come in two ways: ? You spend money on a regular basis to buy tobacco. ? Your health care costs in the long-term are  higher if you smoke.  Tobacco smoke can also affect the health of those around you. Children of smokers have greater chances of: ? Sudden infant death syndrome (SIDS). ? Ear infections. ? Lung infections.  What lifestyle changes can be made?  Do not start smoking. Quit if you already do.  To quit smoking: ? Make a plan to quit smoking and commit yourself to it. Look for programs to help you and ask your health care provider for recommendations and ideas. ? Talk with your health care provider about using nicotine replacement medicines to help you quit. Medicine replacement medicines include gum, lozenges, patches, sprays, or pills. ? Do not replace cigarette smoking with electronic cigarettes, which are commonly called e-cigarettes. The safety of e-cigarettes is not known, and some may contain harmful chemicals. ? Avoid places, people, or situations that tempt you to smoke. ? If you try to quit but return to smoking, don't give up hope. It is very common for people to try a number of times before they fully succeed. When you feel ready again, give it another try.  Quitting smoking might affect the way you eat as well as your weight. Be prepared to monitor your eating habits. Get support in planning and following a healthy diet.  Ask your health care provider about having regular tests (screenings) to check for cancer. This may include blood tests, imaging tests, and other tests.  Exercise regularly. Consider taking walks, joining a gym, or doing yoga or exercise classes.  Develop skills to manage your stress. These skills include meditation. What are the benefits of quitting smoking? By quitting smoking, you may:  Lower your risk of getting cancer and other diseases caused by smoking.  Live longer.  Breathe better.  Lower your blood pressure and heart rate.  Stop your addiction to tobacco.  Stop creating secondhand smoke that hurts other people.  Improve your sense of taste and  smell.  Look  better over time, due to having fewer wrinkles and less staining.  What can happen if changes are not made? If you do not stop smoking, you may:  Get cancer and other diseases.  Develop COPD or other long-term (chronic) lung conditions.  Develop serious problems with your heart and blood vessels (cardiovascular system).  Need more tests to screen for problems caused by smoking.  Have higher, long-term healthcare costs from medicines or treatments related to smoking.  Continue to have worsening changes in your lungs, mouth, and nose.  Where to find support: To get support to quit smoking, consider:  Asking your health care provider for more information and resources.  Taking classes to learn more about quitting smoking.  Looking for local organizations that offer resources about quitting smoking.  Joining a support group for people who want to quit smoking in your local community.  Where to find more information: You may find more information about quitting smoking from:  HelpGuide.org: www.helpguide.org/articles/addictions/how-to-quit-smoking.htm  https://hall.com/: smokefree.gov  American Lung Association: www.lung.org  Contact a health care provider if:  You have problems breathing.  Your lips, nose, or fingers turn blue.  You have chest pain.  You are coughing up blood.  You feel faint or you pass out.  You have other noticeable changes that cause you to worry. Summary  Smoking tobacco can negatively affect your health, the health of those around you, your finances, and your social life.  Do not start smoking. Quit if you already do. If you need help quitting, ask your health care provider.  Think about joining a support group for people who want to quit smoking in your local community. There are many effective programs that will help you to quit this behavior. This information is not intended to replace advice given to you by your health care  provider. Make sure you discuss any questions you have with your health care provider. Document Released: 11/03/2016 Document Revised: 11/03/2016 Document Reviewed: 11/03/2016 Elsevier Interactive Patient Education  [employer].

## 2018-07-28 NOTE — Progress Notes (Signed)
Subjective:   [name] is a 57 y.o. female who presents for Medicare Annual (Subsequent) preventive examination.  Review of Systems:  N/A Cardiac Risk Factors include: dyslipidemia;hypertension;sedentary lifestyle;smoking/ tobacco exposure     Objective:     Vitals: BP 128/72 (BP Location: Left Arm, Patient Position: Sitting, Cuff Size: Large)   Pulse 76   Temp 98 F (36.7 C) (Oral)   Ht 5\' 9"  (1.753 m)   Wt 199 lb 12.8 oz (90.6 kg)   SpO2 95%   BMI 29.51 kg/m   Body mass index is 29.51 kg/m.  Advanced Directives 07/28/2018 04/13/2018 06/02/2017 03/09/2017 03/08/2017 11/27/2016 08/26/2016  Does Patient Have a Medical Advance Directive? No No No No No No No  Would patient like information on creating a medical advance directive? Yes (MAU/Ambulatory/Procedural Areas - Information given) - - - - - No - patient declined information    Tobacco Social History   Tobacco Use  Smoking Status Current Every Day Smoker  . Packs/day: 0.50  . Years: 20.00  . Pack years: 10.00  . Types: Cigarettes  . Start date: 07/26/2002  Smokeless Tobacco Never Used     Ready to quit: No Counseling given: Yes  Clinical Intake:  Pre-visit preparation completed: Yes  Pain : No/denies pain   BMI - recorded: 29.51 Nutritional Status: BMI 25 -29 Overweight Nutritional Risks: None Diabetes: No  How often do you need to have someone help you when you read instructions, pamphlets, or other written materials from your doctor or pharmacy?: 1 - Never  Interpreter Needed?: No  Information entered by :: AEversole, LPN  Past Medical History:  Diagnosis Date  . Contact dermatitis and other eczema, due to unspecified cause   . COPD (chronic obstructive pulmonary disease) ([place])   . Depressive disorder   . Dysmetabolic syndrome X   . GERD (gastroesophageal reflux disease)   . Hyperlipidemia   . Hypertension   . IBS (irritable bowel syndrome)   . Insomnia   . Lumbago   . Nonspecific abnormal  electrocardiogram (ECG) (EKG)   . Other ovarian failure(256.39)   . Postmenopausal atrophic vaginitis   . Symptomatic menopausal or female climacteric states   . Unspecified vitamin D deficiency    Past Surgical History:  Procedure Laterality Date  . BREAST BIOPSY Left    neg  . BREAST LUMPECTOMY    . CERVICAL DISCECTOMY     x 2  . CHOLECYSTECTOMY    . DILATATION & CURETTAGE/HYSTEROSCOPY WITH MYOSURE N/A 03/21/2015   Procedure: DILATATION & CURETTAGE/HYSTEROSCOPY;  Surgeon: Aletha Halim, MD;  Location: [facility];  Service: Gynecology;  Laterality: N/A;  . DILATION AND CURETTAGE OF UTERUS    . ENDOMETRIAL ABLATION    . ESOPHAGOGASTRODUODENOSCOPY (EGD) WITH PROPOFOL N/A 04/13/2018   Procedure: ESOPHAGOGASTRODUODENOSCOPY (EGD) WITH PROPOFOL;  Surgeon: Virgel Manifold, MD;  Location: [facility] END[facility];  Service: Endoscopy;  Laterality: N/A;  . SPINAL FUSION    . TONSILLECTOMY    . TUBAL LIGATION     Family History  Problem Relation Age of Onset  . Heart disease Mother   . Thyroid cancer Mother   . Depression Daughter   . Asthma Daughter    Social History   Socioeconomic History  . Marital status: Legally Separated    Spouse name: Not on file  . Number of children: 4  . Years of education: Not on file  . Highest education level: 12th grade  Occupational History  . Occupation: disabled  Comment: chronic back pain   Social Needs  . Financial resource strain: Not hard at all  . Food insecurity:    Worry: Never true    Inability: Never true  . Transportation needs:    Medical: No    Non-medical: No  Tobacco Use  . Smoking status: Current Every Day Smoker    Packs/day: 0.50    Years: 20.00    Pack years: 10.00    Types: Cigarettes    Start date: 07/26/2002  . Smokeless tobacco: Never Used  Substance and Sexual Activity  . Alcohol use: No    Alcohol/week: 0.0 standard drinks  . Drug use: No  . Sexual activity: Yes    Partners: Male    Birth control/protection:  Other-see comments    Comment: Ablation  Lifestyle  . Physical activity:    Days per week: 0 days    Minutes per session: 0 min  . Stress: Not at all  Relationships  . Social connections:    Talks on phone: Patient refused    Gets together: Patient refused    Attends religious service: Patient refused    Active member of club or organization: Patient refused    Attends meetings of clubs or organizations: Patient refused    Relationship status: Separated  Other Topics Concern  . Not on file  Social History Narrative  . Not on file    Outpatient Encounter Medications as of 07/28/2018  Medication Sig  . acetaminophen (TYLENOL) 500 MG tablet Take 1 tablet (500 mg total) by mouth every 6 (six) hours as needed.  Marland Kitchen amitriptyline (ELAVIL) 10 MG tablet Take 1 tablet (10 mg total) by mouth at bedtime.  Marland Kitchen atorvastatin (LIPITOR) 40 MG tablet Take 1 tablet (40 mg total) by mouth daily.  . famotidine (PEPCID) 40 MG tablet Take 40 mg by mouth as needed.  . gabapentin (NEURONTIN) 300 MG capsule Take 1 capsule by mouth every 12 (twelve) hours.  Marland Kitchen losartan-hydrochlorothiazide (HYZAAR) 100-25 MG tablet Take 1 tablet by mouth daily.  . metFORMIN (GLUCOPHAGE) 500 MG tablet Take 1 tablet (500 mg total) by mouth daily with breakfast.  . omeprazole (PRILOSEC) 20 MG capsule Take 1 capsule (20 mg total) by mouth 2 (two) times daily before a meal.  . oxyCODONE (ROXICODONE) 15 MG immediate release tablet Take 1 tablet by mouth every 4 (four) hours.  Marland Kitchen PRISTIQ 50 MG 24 hr tablet Take 1 tablet (50 mg total) by mouth daily.  . QUEtiapine (SEROQUEL) 25 MG tablet Take 1 tablet (25 mg total) by mouth at bedtime.  Marland Kitchen tiZANidine (ZANAFLEX) 4 MG tablet Take 1 tablet by mouth every 12 (twelve) hours.  Marland Kitchen EVZIO 0.4 MG/0.4ML SOAJ   . lidocaine (LIDODERM) 5 % Place 1 patch onto the skin daily. Remove & Discard patch within 12 hours or as directed by MD (Patient not taking: Reported on 07/28/2018)  . omeprazole (PRILOSEC) 20  MG capsule Take 1 capsule (20 mg total) by mouth 2 (two) times daily before a meal.   No facility-administered encounter medications on file as of 07/28/2018.     Activities of Daily Living In your present state of health, do you have any difficulty performing the following activities: 07/28/2018 04/21/2018  Hearing? N N  Comment denies hearing aids -  Vision? N N  Comment wears eyeglasses -  Difficulty concentrating or making decisions? N N  Walking or climbing stairs? Y N  Comment joint pain and back pain -  Dressing or bathing? N  N  Doing errands, shopping? N N  Preparing Food and eating ? N -  Comment denies dentures -  Using the Toilet? N -  In the past six months, have you accidently leaked urine? N -  Do you have problems with loss of bowel control? N -  Managing your Medications? N -  Managing your Finances? N -  Housekeeping or managing your Housekeeping? N -  Some recent data might be hidden    Patient Care Team: Steele Sizer, MD as PCP - General (Family Medicine) Shanon Ace, MD as Consulting Physician (Anesthesiology) Virgel Manifold, MD as Consulting Physician (Gastroenterology) Earnestine Leys, MD as Consulting Physician (Orthopedic Surgery) Marlowe Sax, MD as Consulting Physician (Internal Medicine)    Assessment:   This is a routine wellness examination for Meerab.  Exercise Activities and Dietary recommendations Current Exercise Habits: The patient does not participate in regular exercise at present, Exercise limited by: None identified  Goals    . Quit Smoking     Recommend to continue efforts to reduce smoking habits until no longer smoking (Smoking Cessation literature attached to AVS).       Fall Risk Fall Risk  07/28/2018 04/21/2018 01/19/2018 10/21/2017 07/26/2017  Falls in the past year? No No No No No  Number falls in past yr: - - - - -  Injury with Fall? - - - - -  Risk for fall due to : Impaired vision;Impaired  balance/gait - - - -  Risk for fall due to: Comment wears eyeglasses; joint pain and back pain - - - -   FALL RISK PREVENTION PERTAINING TO THE HOME:  Any stairs in or around the home WITH handrails? No stairs. Home is all one level Home free of loose throw rugs in walkways, pet beds, electrical cords, etc? Yes  Adequate lighting in your home to reduce risk of falls? Yes   ASSISTIVE DEVICES UTILIZED TO PREVENT FALLS:  Life alert? No  Use of a cane, walker or w/c? No  Grab bars in the bathroom? No  Shower chair or bench in shower? No  Elevated toilet seat or a handicapped toilet? No   DME ORDERS:  DME order needed?  No   TIMED UP AND GO:  Was the test performed? Yes .  Length of time to ambulate 10 feet: 10 sec.   GAIT:  Appearance of gait: Gait stead-fast and without the use of an assistive device.  Education: Fall risk prevention has been discussed.  Intervention(s) required? No   Depression Screen PHQ 2/9 Scores 07/28/2018 04/21/2018 01/19/2018 10/21/2017  PHQ - 2 Score 0 1 0 1  PHQ- 9 Score 0 2 2 -     Cognitive Function     6CIT Screen 07/28/2018  What Year? 0 points  What month? 0 points  What time? 0 points  Count back from 20 0 points  Months in reverse 0 points  Repeat phrase 0 points  Total Score 0    Immunization History  Administered Date(s) Administered  . Influenza,inj,Quad PF,6+ Mos 08/08/2015, 08/26/2016, 07/26/2017, 07/28/2018  . Influenza-Unspecified 09/02/2013, 07/03/2014    Qualifies for Shingles Vaccine? Yes . Due for Shingrix. Education has been provided regarding the importance of this vaccine. Pt has been advised to call insurance company to determine out of pocket expense. Advised may also receive vaccine at local pharmacy or Health Dept. Verbalized acceptance and understanding.  Flu Vaccine: Due for Flu vaccine. Does the patient want to receive this vaccine today?  Yes . Education has been provided regarding the importance of this  vaccine but still declined. Advised may receive this vaccine at local pharmacy or Health Dept. Aware to provide a copy of the vaccination record if obtained from local pharmacy or Health Dept. Verbalized acceptance and understanding.  Screening Tests Health Maintenance  Topic Date Due  . COLONOSCOPY  08/02/2014  . MAMMOGRAM  10/01/2017  . PAP SMEAR  01/14/2018  . TETANUS/TDAP  04/17/2020  . INFLUENZA VACCINE  Completed  . Hepatitis C Screening  Completed  . HIV Screening  Completed    Cancer Screenings:  Colorectal Screening: Completed 08/02/09. Repeat every 5 years. GI referral placed today. Pt aware that she will receive a call re: appt.  Mammogram: Completed 10/01/16. Repeat every year. Ordered today. Pt received call to schedule mammogram while completing this exam. Pt is scheduled 08/04/18.  Bone Density: Not yet required  Lung Cancer Screening: (Low Dose CT Chest recommended if Age 37-80 years, 30 pack-year currently smoking OR have quit w/in 15years.) does not qualify.   Additional Screening:  Hepatitis C Screening:  Completed 07/26/17  Vision Screening: Recommended annual ophthalmology exams for early detection of glaucoma and other disorders of the eye. Is the patient up to date with their annual eye exam?  Yes  Who is the provider or what is the name of the office in which the pt attends annual eye exams? [facility]  Dental Screening: Recommended annual dental exams for proper oral hygiene    Plan:  I have personally reviewed and addressed the Medicare Annual Wellness questionnaire and have noted the following in the patient's chart:  A. Medical and social history B. Use of alcohol, tobacco or illicit drugs  C. Current medications and supplements D. Functional ability and status E.  Nutritional status F.  Physical activity G. Advance directives H. List of other physicians I.  Hospitalizations, surgeries, and ER visits in previous 12 months J.   [place] such as hearing and vision if needed, cognitive and depression L. Referrals and appointments  In addition, I have reviewed and discussed with patient certain preventive protocols, quality metrics, and best practice recommendations. A written personalized care plan for preventive services as well as general preventive health recommendations were provided to patient.  See attached scanned questionnaire for additional information.   Signed,  Aleatha Borer, LPN Nurse Health Advisor

## 2018-07-29 ENCOUNTER — Other Ambulatory Visit: Payer: Self-pay

## 2018-07-29 DIAGNOSIS — K259 Gastric ulcer, unspecified as acute or chronic, without hemorrhage or perforation: Secondary | ICD-10-CM

## 2018-07-29 DIAGNOSIS — M65311 Trigger thumb, right thumb: Secondary | ICD-10-CM | POA: Diagnosis not present

## 2018-07-29 DIAGNOSIS — M1712 Unilateral primary osteoarthritis, left knee: Secondary | ICD-10-CM | POA: Diagnosis not present

## 2018-07-29 DIAGNOSIS — G5602 Carpal tunnel syndrome, left upper limb: Secondary | ICD-10-CM | POA: Diagnosis not present

## 2018-07-29 DIAGNOSIS — Z1211 Encounter for screening for malignant neoplasm of colon: Secondary | ICD-10-CM

## 2018-07-29 DIAGNOSIS — G5603 Carpal tunnel syndrome, bilateral upper limbs: Secondary | ICD-10-CM | POA: Diagnosis not present

## 2018-07-29 DIAGNOSIS — M170 Bilateral primary osteoarthritis of knee: Secondary | ICD-10-CM | POA: Diagnosis not present

## 2018-07-29 MED ORDER — NA SULFATE-K SULFATE-MG SULF 17.5-3.13-1.6 GM/177ML PO SOLN: 1.0000 | Freq: Once | ORAL | 0 refills | Status: AC

## 2018-08-02 ENCOUNTER — Other Ambulatory Visit: Payer: Self-pay

## 2018-08-02 MED ORDER — NA SULFATE-K SULFATE-MG SULF 17.5-3.13-1.6 GM/177ML PO SOLN: 1.0000 | Freq: Once | ORAL | 0 refills | Status: AC

## 2018-08-03 ENCOUNTER — Encounter: Payer: Self-pay | Admitting: Family Medicine

## 2018-08-03 ENCOUNTER — Other Ambulatory Visit (HOSPITAL_COMMUNITY)
Admission: RE | Admit: 2018-08-03 | Discharge: 2018-08-03 | Disposition: A | Discharge status: Home / Self Care | Payer: Medicare Other | Source: Ambulatory Visit | Attending: Family Medicine | Admitting: Family Medicine

## 2018-08-03 ENCOUNTER — Telehealth: Payer: Self-pay | Admitting: Family Medicine

## 2018-08-03 ENCOUNTER — Ambulatory Visit (INDEPENDENT_AMBULATORY_CARE_PROVIDER_SITE_OTHER): Payer: Medicare Other | Admitting: Family Medicine

## 2018-08-03 VITALS — BP 132/78 | HR 88 | Temp 98.0°F | Resp 16 | Ht 68.0 in | Wt 200.2 lb

## 2018-08-03 DIAGNOSIS — J410 Simple chronic bronchitis: Secondary | ICD-10-CM | POA: Diagnosis not present

## 2018-08-03 DIAGNOSIS — Z124 Encounter for screening for malignant neoplasm of cervix: Secondary | ICD-10-CM

## 2018-08-03 DIAGNOSIS — I10 Essential (primary) hypertension: Secondary | ICD-10-CM

## 2018-08-03 DIAGNOSIS — G5603 Carpal tunnel syndrome, bilateral upper limbs: Secondary | ICD-10-CM

## 2018-08-03 DIAGNOSIS — F325 Major depressive disorder, single episode, in full remission: Secondary | ICD-10-CM | POA: Diagnosis not present

## 2018-08-03 DIAGNOSIS — Z23 Encounter for immunization: Secondary | ICD-10-CM

## 2018-08-03 DIAGNOSIS — Z716 Tobacco abuse counseling: Secondary | ICD-10-CM

## 2018-08-03 DIAGNOSIS — E8881 Metabolic syndrome: Secondary | ICD-10-CM | POA: Diagnosis not present

## 2018-08-03 MED ORDER — VARENICLINE TARTRATE 1 MG PO TABS: 1.0000 mg | ORAL_TABLET | Freq: Two times a day (BID) | ORAL | 1 refills | Status: DC

## 2018-08-03 MED ORDER — VARENICLINE TARTRATE 0.5 MG PO TABS: 0.5000 mg | ORAL_TABLET | Freq: Two times a day (BID) | ORAL | 0 refills | Status: DC

## 2018-08-03 NOTE — Telephone Encounter (Signed)
Copied from [place] [phone]. Topic: Quick Communication - Rx Refill/Question >> Aug 03, 2018 11:24 AM Reyne Dumas L wrote: Medication:  varenicline (CHANTIX) 0.5 MG tablet  Renee at [facility] calling to get clarification on directions of script they just received. Joseph Art can be reached at [phone]

## 2018-08-03 NOTE — Telephone Encounter (Signed)
If they have at started kit dispense that, otherwise chantix 0.5 mg BID

## 2018-08-03 NOTE — Progress Notes (Signed)
Name: [name]   MRN: [id number]    DOB: 10-07-1961   Date:08/03/2018       Progress Note  Subjective  Chief Complaint  Chief Complaint  Patient presents with  . Paperwork Surgical Clearance  . Carpal Tunnel    Surgery scheduled on the October 14th, Left Hand with Dr. Sabra Heck    HPI  Carpal Tunnel and tendinitis: she is under the care of Dr. Sabra Heck and has a left carpal tunnel release scheduled for 08/15/2018. She is left hand dominant, and has constant tingling, numbness and burning sensation on left hand. States wakes up at night with severe pain at times and has to hang her arms below her bed and shake. She has a history of general anesthesia without complications. She does not have false teeth and does not wear dentures. The procedure is low risk and patient may proceed without further testing  Metabolic syndrome: weight is stable now. No longer has diarrhea from Metformin, but only taking one pill daily.She denies polyphagia, polydipsia or polyuria. Discussed GLP-1 agonist, but she states her mother hada history of thyroid cancer.Continue life style modification. Last hgbA1C was 5.8%, recheck next visit   PPI:RJJO at goal, no longer has dizziness, denies palpitation and shortness of breath   Depression Major: long history of depression, got worse when mother died in 02-02-2017, but sates getting back to baseline. She states still has down days but improving. Excited about carrying for her granddaughter once she has carpal tunnel surgery. No suicidal thoughts or ideation.   COPD:She is still smokingand has been smoking more lately, up to 1 pack daily.  She is not ready to quit at this tim, but discussed importance of quitting, she denies cough, wheezing or SOB. She stopped all inhalers.She does not want medications at this time   Patient Active Problem List   Diagnosis Date Noted  . Pancreatic lesion 04/21/2018  . Abdominal aortic atherosclerosis ([place]) 04/21/2018  . Reflux  esophagitis   . Stomach irritation   . Acute peptic ulcer of stomach   . Gastric polyp   . Abdominal pain, epigastric   . Trigger thumb of right hand 02/25/2018  . Carpal tunnel syndrome on both sides 10/21/2017  . CRP elevated 08/09/2017  . Elevated C-reactive protein 07/28/2017  . Osteoarthritis of both knees 02/19/2016  . COPD, mild ([place]) 05/08/2015  . Vitamin D deficiency 05/07/2015  . Metabolic syndrome 84/16/6063  . Eczema 05/07/2015  . Chronic insomnia 05/07/2015  . Hypertension, benign 05/07/2015  . Dyslipidemia 05/07/2015  . Hyperglycemia 05/07/2015  . Chronic low back pain 05/07/2015  . IBS (irritable bowel syndrome) 05/07/2015  . Gastroesophageal reflux disease without esophagitis 05/07/2015  . Major depression, chronic ([place]) 05/07/2015  . Menopausal syndrome (hot flashes) 05/07/2015  . History of postmenopausal bleeding 01/14/2015    Past Surgical History:  Procedure Laterality Date  . BREAST BIOPSY Left    neg  . BREAST LUMPECTOMY    . CERVICAL DISCECTOMY     x 2  . CHOLECYSTECTOMY    . DILATATION & CURETTAGE/HYSTEROSCOPY WITH MYOSURE N/A 03/21/2015   Procedure: DILATATION & CURETTAGE/HYSTEROSCOPY;  Surgeon: Aletha Halim, MD;  Location: [facility] OR[facility];  Service: Gynecology;  Laterality: N/A;  . DILATION AND CURETTAGE OF UTERUS    . ENDOMETRIAL ABLATION    . ESOPHAGOGASTRODUODENOSCOPY (EGD) WITH PROPOFOL N/A 04/13/2018   Procedure: ESOPHAGOGASTRODUODENOSCOPY (EGD) WITH PROPOFOL;  Surgeon: Virgel Manifold, MD;  Location: [facility] END[facility];  Service: Endoscopy;  Laterality: N/A;  . SPINAL  FUSION    . TONSILLECTOMY    . TUBAL LIGATION      Family History  Problem Relation Age of Onset  . Heart disease Mother   . Thyroid cancer Mother   . Depression Daughter   . Asthma Daughter     Social History   Socioeconomic History  . Marital status: Legally Separated    Spouse name: Not on file  . Number of children: 4  . Years of education: Not on file  .  Highest education level: 12th grade  Occupational History  . Occupation: disabled    Comment: chronic back pain   Social Needs  . Financial resource strain: Not hard at all  . Food insecurity:    Worry: Never true    Inability: Never true  . Transportation needs:    Medical: No    Non-medical: No  Tobacco Use  . Smoking status: Current Every Day Smoker    Packs/day: 0.50    Years: 20.00    Pack years: 10.00    Types: Cigarettes    Start date: 07/26/2002  . Smokeless tobacco: Never Used  Substance and Sexual Activity  . Alcohol use: No    Alcohol/week: 0.0 standard drinks  . Drug use: No  . Sexual activity: Yes    Partners: Male    Birth control/protection: Other-see comments    Comment: Ablation  Lifestyle  . Physical activity:    Days per week: 0 days    Minutes per session: 0 min  . Stress: Not at all  Relationships  . Social connections:    Talks on phone: Patient refused    Gets together: Patient refused    Attends religious service: Patient refused    Active member of club or organization: Patient refused    Attends meetings of clubs or organizations: Patient refused    Relationship status: Separated  . Intimate partner violence:    Fear of current or ex partner: No    Emotionally abused: No    Physically abused: No    Forced sexual activity: No  Other Topics Concern  . Not on file  Social History Narrative  . Not on file     Current Outpatient Medications:  .  acetaminophen (TYLENOL) 500 MG tablet, Take 1 tablet (500 mg total) by mouth every 6 (six) hours as needed., Disp: 30 tablet, Rfl: 2 .  amitriptyline (ELAVIL) 10 MG tablet, Take 1 tablet (10 mg total) by mouth at bedtime., Disp: 30 tablet, Rfl: 5 .  atorvastatin (LIPITOR) 40 MG tablet, Take 1 tablet (40 mg total) by mouth daily., Disp: 90 tablet, Rfl: 3 .  EVZIO 0.4 MG/0.4ML SOAJ, , Disp: , Rfl:  .  famotidine (PEPCID) 40 MG tablet, Take 40 mg by mouth as needed., Disp: , Rfl:  .  gabapentin  (NEURONTIN) 300 MG capsule, Take 1 capsule by mouth every 12 (twelve) hours., Disp: , Rfl:  .  lidocaine (LIDODERM) 5 %, Place 1 patch onto the skin daily. Remove & Discard patch within 12 hours or as directed by MD, Disp: 30 patch, Rfl: 0 .  losartan-hydrochlorothiazide (HYZAAR) 100-25 MG tablet, Take 1 tablet by mouth daily., Disp: 30 tablet, Rfl: 5 .  metFORMIN (GLUCOPHAGE) 500 MG tablet, Take 1 tablet (500 mg total) by mouth daily with breakfast., Disp: 30 tablet, Rfl: 5 .  omeprazole (PRILOSEC) 20 MG capsule, Take 1 capsule (20 mg total) by mouth 2 (two) times daily before a meal., Disp: 60 capsule, Rfl: 0 .  oxyCODONE (ROXICODONE) 15 MG immediate release tablet, Take 1 tablet by mouth every 4 (four) hours., Disp: , Rfl:  .  PRISTIQ 50 MG 24 hr tablet, Take 1 tablet (50 mg total) by mouth daily., Disp: 30 tablet, Rfl: 5 .  QUEtiapine (SEROQUEL) 25 MG tablet, Take 1 tablet (25 mg total) by mouth at bedtime., Disp: 30 tablet, Rfl: 5 .  tiZANidine (ZANAFLEX) 4 MG tablet, Take 1 tablet by mouth every 12 (twelve) hours., Disp: , Rfl:  .  omeprazole (PRILOSEC) 20 MG capsule, Take 1 capsule (20 mg total) by mouth 2 (two) times daily before a meal., Disp: 30 capsule, Rfl: 1  No Known Allergies  I personally reviewed active problem list, medication list, allergies, family history, social history with the patient/caregiver today.   ROS  Constitutional: Negative for fever or weight change.  Respiratory: Negative for cough and shortness of breath.   Cardiovascular: Negative for chest pain or palpitations.  Gastrointestinal: Negative for abdominal pain, no bowel changes.  Musculoskeletal: Negative for gait problem or joint swelling.  Skin: Negative for rash.  Neurological: Negative for dizziness or headache.  No other specific complaints in a complete review of systems (except as listed in HPI above).  Objective  Vitals:   08/03/18 1027  BP: 132/78  Pulse: 88  Resp: 16  Temp: 98 F (36.7  C)  TempSrc: Oral  SpO2: 99%  Weight: 200 lb 3.2 oz (90.8 kg)  Height: '5\' 8"'$  (1.727 m)    Body mass index is 30.44 kg/m.  Physical Exam  Constitutional: Patient appears well-developed and obese . No distress.  HENT: Head: Normocephalic and atraumatic. Ears: B TMs ok, no erythema or effusion; Nose: Nose normal. Mouth/Throat: Oropharynx is clear and moist. No oropharyngeal exudate.  Eyes: Conjunctivae and EOM are normal. Pupils are equal, round, and reactive to light. No scleral icterus.  Neck: Normal range of motion. Neck supple. No JVD present. No thyromegaly present.  Cardiovascular: Normal rate, regular rhythm and normal heart sounds.  No murmur heard. No BLE edema. Pulmonary/Chest: Effort normal and breath sounds normal. No respiratory distress. Abdominal: Soft. Bowel sounds are normal, no distension. There is no tenderness. no masses Breast: no lumps or masses, no nipple discharge or rashes FEMALE GENITALIA:  External genitalia normal External urethra normal Vaginal vault normal without discharge or lesions Cervix normal without discharge or lesions Bimanual exam normal without masses RECTAL: not done Musculoskeletal: Normal range of motion, no joint effusions. No gross deformities Neurological: he is alert and oriented to person, place, and time. No cranial nerve deficit. Coordination, balance, strength, speech and gait are normal. tinnel and phalen's positive, normal grip  Skin: Skin is warm and dry. No rash noted. No erythema.  Psychiatric: Patient has a normal mood and affect. behavior is normal. Judgment and thought content normal.  PHQ2/9: Depression screen [facility] 2/9 07/28/2018 04/21/2018 01/19/2018 10/21/2017 07/26/2017  Decreased Interest 0 0 0 0 1  Down, Depressed, Hopeless 0 1 0 1 2  PHQ - 2 Score 0 1 0 1 3  Altered sleeping 0 1 1 - 1  Tired, decreased energy 0 0 1 - 0  Change in appetite 0 0 0 - 3  Feeling bad or failure about yourself  0 0 0 - 1  Trouble  concentrating 0 0 0 - 0  Moving slowly or fidgety/restless 0 0 0 - 0  Suicidal thoughts 0 0 0 - 0  PHQ-9 Score 0 2 2 - 8  Difficult doing work/chores Not  difficult at all Not difficult at all Not difficult at all - Not difficult at all   Fall Risk: Fall Risk  07/28/2018 04/21/2018 01/19/2018 10/21/2017 07/26/2017  Falls in the past year? No No No No No  Number falls in past yr: - - - - -  Injury with Fall? - - - - -  Risk for fall due to : Impaired vision;Impaired balance/gait - - - -  Risk for fall due to: Comment wears eyeglasses; joint pain and back pain - - - -     Assessment & Plan  1. Carpal tunnel syndrome on both sides  May proceed to surgery without further testing   2. Hypertension, benign  At goal, continue medication up to the morning of surgery with a sip of water   3. Metabolic syndrome  Last [id number] was stable, discussed importance of following a diabetic diet  4. Cervical cancer screening  Pap smear today   5. Major depression in remission  (HCC)  Stable  6. Simple chronic bronchitis (HCC)  Stable, but needs to quit smoking   7. Encounter for tobacco use cessation counseling  - varenicline (CHANTIX CONTINUING MONTH PAK) 1 MG tablet; Take 1 tablet (1 mg total) by mouth 2 (two) times daily.  Dispense: 60 tablet; Refill: 1 - varenicline (CHANTIX) 0.5 MG tablet; Take 1 tablet (0.5 mg total) by mouth 2 (two) times daily. Started kit if possible  Dispense: 60 tablet; Refill: 0  8. Need for pneumococcal vaccine  - Pneumococcal polysaccharide vaccine 23-valent greater than or equal to 2yo subcutaneous/IM

## 2018-08-03 NOTE — Telephone Encounter (Signed)
[place] to give the a verbal to proceed with Chantix starter kit and for the patient to use as it is written on the box.

## 2018-08-04 ENCOUNTER — Ambulatory Visit
Admission: RE | Admit: 2018-08-04 | Discharge: 2018-08-04 | Disposition: A | Discharge status: Home / Self Care | Payer: Medicare Other | Source: Ambulatory Visit | Attending: Family Medicine | Admitting: Family Medicine

## 2018-08-04 DIAGNOSIS — Z1239 Encounter for other screening for malignant neoplasm of breast: Secondary | ICD-10-CM

## 2018-08-04 DIAGNOSIS — Z1231 Encounter for screening mammogram for malignant neoplasm of breast: Secondary | ICD-10-CM | POA: Diagnosis not present

## 2018-08-05 LAB — CYTOLOGY - PAP
DIAGNOSIS: NEGATIVE
HPV: NOT DETECTED

## 2018-08-08 ENCOUNTER — Ambulatory Visit: Payer: Medicare Other | Admitting: Anesthesiology

## 2018-08-08 ENCOUNTER — Encounter
Admission: RE | Disposition: A | Discharge status: Home / Self Care | Payer: Self-pay | Source: Ambulatory Visit | Attending: Gastroenterology

## 2018-08-08 ENCOUNTER — Other Ambulatory Visit: Payer: Self-pay | Admitting: Specialist

## 2018-08-08 ENCOUNTER — Encounter: Payer: Self-pay | Admitting: *Deleted

## 2018-08-08 ENCOUNTER — Ambulatory Visit
Admission: RE | Admit: 2018-08-08 | Discharge: 2018-08-08 | Disposition: A | Discharge status: Home / Self Care | Payer: Medicare Other | Source: Ambulatory Visit | Attending: Gastroenterology | Admitting: Gastroenterology

## 2018-08-08 DIAGNOSIS — M1990 Unspecified osteoarthritis, unspecified site: Secondary | ICD-10-CM | POA: Diagnosis not present

## 2018-08-08 DIAGNOSIS — G8929 Other chronic pain: Secondary | ICD-10-CM | POA: Insufficient documentation

## 2018-08-08 DIAGNOSIS — M549 Dorsalgia, unspecified: Secondary | ICD-10-CM | POA: Diagnosis not present

## 2018-08-08 DIAGNOSIS — K317 Polyp of stomach and duodenum: Secondary | ICD-10-CM | POA: Diagnosis not present

## 2018-08-08 DIAGNOSIS — K2950 Unspecified chronic gastritis without bleeding: Secondary | ICD-10-CM | POA: Diagnosis not present

## 2018-08-08 DIAGNOSIS — Z1211 Encounter for screening for malignant neoplasm of colon: Secondary | ICD-10-CM | POA: Insufficient documentation

## 2018-08-08 DIAGNOSIS — K210 Gastro-esophageal reflux disease with esophagitis: Secondary | ICD-10-CM | POA: Diagnosis not present

## 2018-08-08 DIAGNOSIS — J449 Chronic obstructive pulmonary disease, unspecified: Secondary | ICD-10-CM | POA: Diagnosis not present

## 2018-08-08 DIAGNOSIS — F329 Major depressive disorder, single episode, unspecified: Secondary | ICD-10-CM | POA: Diagnosis not present

## 2018-08-08 DIAGNOSIS — K5730 Diverticulosis of large intestine without perforation or abscess without bleeding: Secondary | ICD-10-CM | POA: Diagnosis not present

## 2018-08-08 DIAGNOSIS — K259 Gastric ulcer, unspecified as acute or chronic, without hemorrhage or perforation: Secondary | ICD-10-CM | POA: Diagnosis not present

## 2018-08-08 DIAGNOSIS — Z8711 Personal history of peptic ulcer disease: Secondary | ICD-10-CM | POA: Insufficient documentation

## 2018-08-08 DIAGNOSIS — G4700 Insomnia, unspecified: Secondary | ICD-10-CM | POA: Insufficient documentation

## 2018-08-08 DIAGNOSIS — E559 Vitamin D deficiency, unspecified: Secondary | ICD-10-CM | POA: Insufficient documentation

## 2018-08-08 DIAGNOSIS — K589 Irritable bowel syndrome without diarrhea: Secondary | ICD-10-CM | POA: Insufficient documentation

## 2018-08-08 DIAGNOSIS — F17210 Nicotine dependence, cigarettes, uncomplicated: Secondary | ICD-10-CM | POA: Diagnosis not present

## 2018-08-08 DIAGNOSIS — K3189 Other diseases of stomach and duodenum: Secondary | ICD-10-CM | POA: Diagnosis not present

## 2018-08-08 DIAGNOSIS — Z8249 Family history of ischemic heart disease and other diseases of the circulatory system: Secondary | ICD-10-CM | POA: Insufficient documentation

## 2018-08-08 DIAGNOSIS — K219 Gastro-esophageal reflux disease without esophagitis: Secondary | ICD-10-CM | POA: Insufficient documentation

## 2018-08-08 DIAGNOSIS — K635 Polyp of colon: Secondary | ICD-10-CM | POA: Diagnosis not present

## 2018-08-08 DIAGNOSIS — R1013 Epigastric pain: Secondary | ICD-10-CM

## 2018-08-08 DIAGNOSIS — I10 Essential (primary) hypertension: Secondary | ICD-10-CM | POA: Diagnosis not present

## 2018-08-08 DIAGNOSIS — E8881 Metabolic syndrome: Secondary | ICD-10-CM | POA: Diagnosis not present

## 2018-08-08 DIAGNOSIS — E785 Hyperlipidemia, unspecified: Secondary | ICD-10-CM | POA: Diagnosis not present

## 2018-08-08 DIAGNOSIS — D122 Benign neoplasm of ascending colon: Secondary | ICD-10-CM | POA: Diagnosis not present

## 2018-08-08 DIAGNOSIS — Z79899 Other long term (current) drug therapy: Secondary | ICD-10-CM | POA: Diagnosis not present

## 2018-08-08 DIAGNOSIS — K279 Peptic ulcer, site unspecified, unspecified as acute or chronic, without hemorrhage or perforation: Secondary | ICD-10-CM | POA: Diagnosis not present

## 2018-08-08 DIAGNOSIS — Z7984 Long term (current) use of oral hypoglycemic drugs: Secondary | ICD-10-CM | POA: Diagnosis not present

## 2018-08-08 HISTORY — PX: COLONOSCOPY WITH PROPOFOL: SHX5780

## 2018-08-08 HISTORY — PX: ESOPHAGOGASTRODUODENOSCOPY (EGD) WITH PROPOFOL: SHX5813

## 2018-08-08 SURGERY — COLONOSCOPY WITH PROPOFOL
Anesthesia: General

## 2018-08-08 MED ORDER — PROPOFOL 500 MG/50ML IV EMUL
INTRAVENOUS | Status: DC | PRN
  Administered 2018-08-08: 175 ug/kg/min via INTRAVENOUS

## 2018-08-08 MED ORDER — SODIUM CHLORIDE 0.9 % IV SOLN
INTRAVENOUS | Status: DC
  Administered 2018-08-08: 13:00:00 via INTRAVENOUS

## 2018-08-08 MED ORDER — PROPOFOL 10 MG/ML IV BOLUS
INTRAVENOUS | Status: DC | PRN
  Administered 2018-08-08: 70 mg via INTRAVENOUS
  Administered 2018-08-08: 30 mg via INTRAVENOUS
  Administered 2018-08-08: 100 mg via INTRAVENOUS

## 2018-08-08 NOTE — Anesthesia Post-op Follow-up Note (Signed)
Anesthesia QCDR form completed.        

## 2018-08-08 NOTE — Anesthesia Procedure Notes (Signed)
Date/Time: 08/08/2018 2:11 PM Performed by: Nelda Marseille, CRNA Pre-anesthesia Checklist: Patient identified, Emergency Drugs available, Suction available, Patient being monitored and Timeout performed Oxygen Delivery Method: Nasal cannula

## 2018-08-08 NOTE — Op Note (Signed)
[facility] Gastroenterology Patient Name: [name] Procedure Date: 08/08/2018 1:48 PM MRN: [id number] Account #: [id number] Date of Birth: 1961/08/25 Admit Type: Outpatient Age: 57 Room: [facility] ENDO ROOM 2 Gender: Female Note Status: Finalized Procedure:            Upper GI endoscopy Indications:          Epigastric abdominal pain, Peptic ulcer Providers:            Chaye Misch B. Bonna Gains MD, MD Referring MD:         Bethena Roys. Sowles, MD (Referring MD) Medicines:            Monitored Anesthesia Care Complications:        No immediate complications. Procedure:            Pre-Anesthesia Assessment:                       - The risks and benefits of the procedure and the                        sedation options and risks were discussed with the                        patient. All questions were answered and informed                        consent was obtained.                       - Patient identification and proposed procedure were                        verified prior to the procedure.                       - ASA Grade Assessment: III - A patient with severe                        systemic disease.                       After obtaining informed consent, the endoscope was                        passed under direct vision. Throughout the procedure,                        the patient's blood pressure, pulse, and oxygen                        saturations were monitored continuously. The Endoscope                        was introduced through the mouth, and advanced to the                        second part of duodenum. The upper GI endoscopy was                        accomplished with ease. The patient tolerated the  procedure well. Findings:      The gastroesophageal junction and examined esophagus were normal.      A single 4 mm mucosal papule (nodule) with no bleeding and no stigmata       of recent bleeding was found in the gastric  antrum. Biopsies were taken       with a cold forceps for histology.      A single 5 mm sessile polyp with no bleeding and no stigmata of recent       bleeding was found in the gastric fundus. Biopsies were taken with a       cold forceps for histology.      The exam of the stomach was otherwise normal.      There is no endoscopic evidence of ulceration in the entire examined       stomach.      The examined duodenum was normal. Impression:           - Normal gastroesophageal junction and esophagus.                       - A single mucosal papule (nodule) found in the                        stomach. Biopsied.                       - A single gastric polyp. Biopsied.                       - Normal examined duodenum. Recommendation:       - Await pathology results.                       - Continue present medications.                       - The findings and recommendations were discussed with                        the patient.                       - The findings and recommendations were discussed with                        the patient's family.                       - Return to my office in 4 weeks.                       - Return to primary care physician in 4 weeks.                       - Avoid NSAIDs except Aspirin if medically indicated Procedure Code(s):    --- Professional ---                       [phone], Esophagogastroduodenoscopy, flexible, transoral;                        with biopsy, single or multiple Diagnosis Code(s):    --- Professional ---  K31.89, Other diseases of stomach and duodenum                       K31.7, Polyp of stomach and duodenum                       R10.13, Epigastric pain                       K27.9, Peptic ulcer, site unspecified, unspecified as                        acute or chronic, without hemorrhage or perforation CPT copyright 2017 American Medical Association. All rights reserved. The codes documented in this report are  preliminary and upon coder review may  be revised to meet current compliance requirements.  [name], MD Margretta Sidle B. Bonna Gains MD, MD 08/08/2018 2:10:26 PM This report has been signed electronically. Number of Addenda: 0 Note Initiated On: 08/08/2018 1:48 PM Estimated Blood Loss: Estimated blood loss: none.      [facility]

## 2018-08-08 NOTE — Anesthesia Postprocedure Evaluation (Signed)
Anesthesia Post Note  Patient: [name]  Procedure(s) Performed: COLONOSCOPY WITH PROPOFOL (N/A ) ESOPHAGOGASTRODUODENOSCOPY (EGD) WITH PROPOFOL (N/A )  Patient location during evaluation: Endoscopy Anesthesia Type: General Level of consciousness: awake and alert Pain management: pain level controlled Vital Signs Assessment: post-procedure vital signs reviewed and stable Respiratory status: spontaneous breathing, nonlabored ventilation, respiratory function stable and patient connected to nasal cannula oxygen Cardiovascular status: blood pressure returned to baseline and stable Postop Assessment: no apparent nausea or vomiting Anesthetic complications: no     Last Vitals:  Vitals:   08/08/18 1445 08/08/18 1455  BP: 139/90 (!) 184/98  Pulse:    Resp:    Temp:    SpO2:      Last Pain:  Vitals:   08/08/18 1455  TempSrc:   PainSc: 0-No pain                 [name] S

## 2018-08-08 NOTE — Op Note (Signed)
[facility] Gastroenterology Patient Name: Daylan Boggess Procedure Date: 08/08/2018 1:47 PM MRN: [id number] Account #: [id number] Date of Birth: 1961-10-28 Admit Type: Outpatient Age: 57 Room: [facility] ENDO ROOM 2 Gender: Female Note Status: Finalized Procedure:            Colonoscopy Indications:          Screening for colorectal malignant neoplasm, Incidental                        - Diarrhea Providers:            Timiya Howells B. Bonna Gains MD, MD Referring MD:         Bethena Roys. Sowles, MD (Referring MD) Medicines:            Monitored Anesthesia Care Complications:        No immediate complications. Procedure:            Pre-Anesthesia Assessment:                       - ASA Grade Assessment: III - A patient with severe                        systemic disease.                       - Prior to the procedure, a History and Physical was                        performed, and patient medications, allergies and                        sensitivities were reviewed. The patient's tolerance of                        previous anesthesia was reviewed.                       - The risks and benefits of the procedure and the                        sedation options and risks were discussed with the                        patient. All questions were answered and informed                        consent was obtained.                       - Patient identification and proposed procedure were                        verified prior to the procedure by the physician, the                        nurse, the anesthesiologist, the anesthetist and the                        technician. The procedure was verified in the procedure  room.                       After obtaining informed consent, the colonoscope was                        passed under direct vision. Throughout the procedure,                        the patient's blood pressure, pulse, and oxygen    saturations were monitored continuously. The                        Colonoscope was introduced through the anus and                        advanced to the the terminal ileum. The colonoscopy was                        performed with ease. The patient tolerated the                        procedure well. The quality of the bowel preparation                        was fair. Findings:      The perianal and digital rectal examinations were normal.      Two sessile polyps were found in the ascending colon. The polyps were 3       to 4 mm in size. These polyps were removed with a cold biopsy forceps.       Resection and retrieval were complete.      A few diverticula were found in the sigmoid colon.      The exam was otherwise without abnormality.      The rectum, sigmoid colon, descending colon, transverse colon, ascending       colon, cecum and ileum appeared normal. Biopsies for histology were       taken with a cold forceps from the entire colon for evaluation of       microscopic colitis.      The retroflexed view of the distal rectum and anal verge was normal and       showed no anal or rectal abnormalities. Impression:           - Two 3 to 4 mm polyps in the ascending colon, removed                        with a cold biopsy forceps. Resected and retrieved.                       - Diverticulosis in the sigmoid colon.                       - The examination was otherwise normal.                       - The rectum, sigmoid colon, descending colon,                        transverse colon, ascending colon and cecum are normal.  Biopsied.                       - The distal rectum and anal verge are normal on                        retroflexion view. Recommendation:       - Discharge patient to home (with escort).                       - High fiber diet.                       - Advance diet as tolerated.                       - Continue present medications.                        - Await pathology results.                       - Repeat colonoscopy in 3 years for surveillance, with                        2 day prep.                       - The findings and recommendations were discussed with                        the patient.                       - The findings and recommendations were discussed with                        the patient's family.                       - Return to primary care physician as previously                        scheduled. Procedure Code(s):    --- Professional ---                       [phone], Colonoscopy, flexible; with biopsy, single or                        multiple Diagnosis Code(s):    --- Professional ---                       Z12.11, Encounter for screening for malignant neoplasm                        of colon                       D12.2, Benign neoplasm of ascending colon                       K57.30, Diverticulosis of large intestine without  perforation or abscess without bleeding CPT copyright 2017 American Medical Association. All rights reserved. The codes documented in this report are preliminary and upon coder review may  be revised to meet current compliance requirements.  [name], MD Margretta Sidle B. Bonna Gains MD, MD 08/08/2018 2:35:46 PM This report has been signed electronically. Number of Addenda: 0 Note Initiated On: 08/08/2018 1:47 PM Scope Withdrawal Time: 0 hours 12 minutes 20 seconds  Total Procedure Duration: 0 hours 18 minutes 7 seconds  Estimated Blood Loss: Estimated blood loss: none.      [facility]

## 2018-08-08 NOTE — Anesthesia Preprocedure Evaluation (Signed)
Anesthesia Evaluation  Patient identified by MRN, date of birth, ID band Patient awake    Reviewed: Allergy & Precautions, H&P , NPO status , Patient's Chart, lab work & pertinent test results  Airway Mallampati: II  TM Distance: <3 FB Neck ROM: Full    Dental  (+) Teeth Intact   Pulmonary neg shortness of breath, COPD (no inhalers), neg recent URI, Current Smoker,           Cardiovascular hypertension, (-) angina(-) CAD, (-) Past MI and (-) Cardiac Stents (-) dysrhythmias (-) Valvular Problems/Murmurs     Neuro/Psych PSYCHIATRIC DISORDERS Depression    GI/Hepatic Neg liver ROS, PUD, GERD  ,  Endo/Other  negative endocrine ROS  Renal/GU negative Renal ROS  negative genitourinary   Musculoskeletal negative musculoskeletal ROS (+) Arthritis ,   Abdominal Normal abdominal exam  (+)   Peds negative pediatric ROS (+)  Hematology negative hematology ROS (+)   Anesthesia Other Findings Past Medical History: No date: Contact dermatitis and other eczema, due to unspecified cause No date: COPD (chronic obstructive pulmonary disease) (HCC) No date: Depressive disorder No date: Dysmetabolic syndrome X No date: GERD (gastroesophageal reflux disease) No date: Hyperlipidemia No date: Hypertension No date: IBS (irritable bowel syndrome) No date: Insomnia No date: Lumbago No date: Nonspecific abnormal electrocardiogram (ECG) (EKG) No date: Other ovarian failure(256.39) No date: Postmenopausal atrophic vaginitis No date: Symptomatic menopausal or female climacteric states No date: Unspecified vitamin D deficiency   Reproductive/Obstetrics negative OB ROS                             Anesthesia Physical  Anesthesia Plan  ASA: III  Anesthesia Plan: General   Post-op Pain Management:    Induction: Intravenous  PONV Risk Score and Plan: 2 and Propofol infusion and TIVA  Airway Management  Planned: Nasal Cannula  Additional Equipment:   Intra-op Plan:   Post-operative Plan:   Informed Consent: I have reviewed the patients History and Physical, chart, labs and discussed the procedure including the risks, benefits and alternatives for the proposed anesthesia with the patient or authorized representative who has indicated his/her understanding and acceptance.     Plan Discussed with: CRNA, Surgeon and Anesthesiologist  Anesthesia Plan Comments:         Anesthesia Quick Evaluation

## 2018-08-08 NOTE — Transfer of Care (Signed)
Immediate Anesthesia Transfer of Care Note  Patient: [name]  Procedure(s) Performed: COLONOSCOPY WITH PROPOFOL (N/A ) ESOPHAGOGASTRODUODENOSCOPY (EGD) WITH PROPOFOL (N/A )  Patient Location: PACU  Anesthesia Type:General  Level of Consciousness: awake, alert  and oriented  Airway & Oxygen Therapy: Patient Spontanous Breathing and Patient connected to nasal cannula oxygen  Post-op Assessment: Report given to RN and Post -op Vital signs reviewed and stable  Post vital signs: Reviewed and stable  Last Vitals:  Vitals Value Taken Time  BP    Temp    Pulse 79 08/08/2018  2:36 PM  Resp 16 08/08/2018  2:36 PM  SpO2 100 % 08/08/2018  2:36 PM  Vitals shown include unvalidated device data.  Last Pain:  Vitals:   08/08/18 1256  TempSrc: Tympanic  PainSc: 6          Complications: No apparent anesthesia complications

## 2018-08-08 NOTE — H&P (Signed)
[name], MD [address], [place], [place], [place], [address] [address], [place], [place], [place], [address] Phone: [phone]  Fax: [phone]  Primary Care Physician:  Steele Sizer, MD   Pre-Procedure History & Physical: HPI:  [name] is a 57 y.o. female is here for a colonoscopy and EGD.   Past Medical History:  Diagnosis Date  . Contact dermatitis and other eczema, due to unspecified cause   . COPD (chronic obstructive pulmonary disease) ([place])   . Depressive disorder   . Dysmetabolic syndrome X   . GERD (gastroesophageal reflux disease)   . Hyperlipidemia   . Hypertension   . IBS (irritable bowel syndrome)   . Insomnia   . Lumbago   . Nonspecific abnormal electrocardiogram (ECG) (EKG)   . Other ovarian failure(256.39)   . Postmenopausal atrophic vaginitis   . Symptomatic menopausal or female climacteric states   . Unspecified vitamin D deficiency     Past Surgical History:  Procedure Laterality Date  . BREAST BIOPSY Left    neg  . BREAST LUMPECTOMY    . CERVICAL DISCECTOMY     x 2  . CHOLECYSTECTOMY    . DILATATION & CURETTAGE/HYSTEROSCOPY WITH MYOSURE N/A 03/21/2015   Procedure: DILATATION & CURETTAGE/HYSTEROSCOPY;  Surgeon: Aletha Halim, MD;  Location: [facility] ORS;  Service: Gynecology;  Laterality: N/A;  . DILATION AND CURETTAGE OF UTERUS    . ENDOMETRIAL ABLATION    . ESOPHAGOGASTRODUODENOSCOPY (EGD) WITH PROPOFOL N/A 04/13/2018   Procedure: ESOPHAGOGASTRODUODENOSCOPY (EGD) WITH PROPOFOL;  Surgeon: Virgel Manifold, MD;  Location: [facility] ENDOSCOPY;  Service: Endoscopy;  Laterality: N/A;  . SPINAL FUSION    . TONSILLECTOMY    . TUBAL LIGATION      Prior to Admission medications   Medication Sig Start Date End Date Taking? Authorizing Provider  acetaminophen (TYLENOL) 500 MG tablet Take 1 tablet (500 mg total) by mouth every 6 (six) hours as needed. 02/19/16  Yes Sowles, Drue Stager, MD  amitriptyline (ELAVIL) 10 MG tablet Take  1 tablet (10 mg total) by mouth at bedtime. 04/21/18  Yes Sowles, Drue Stager, MD  atorvastatin (LIPITOR) 40 MG tablet Take 1 tablet (40 mg total) by mouth daily. 04/21/18  Yes Sowles, Drue Stager, MD  cholecalciferol (VITAMIN D) 1000 units tablet Take 1,000 Units by mouth daily.   Yes [provider]  famotidine (PEPCID) 40 MG tablet Take 40 mg by mouth daily as needed for heartburn.    Yes [provider]  gabapentin (NEURONTIN) 300 MG capsule Take 1 capsule by mouth 2 (two) times daily.  05/07/15  Yes [provider]  lidocaine (LIDODERM) 5 % Place 1 patch onto the skin daily. Remove & Discard patch within 12 hours or as directed by MD Patient taking differently: Place 1 patch onto the skin daily as needed (pain). Remove & Discard patch within 12 hours or as directed by MD 10/21/17  Yes Sowles, Drue Stager, MD  losartan-hydrochlorothiazide (HYZAAR) 100-25 MG tablet Take 1 tablet by mouth daily. 04/21/18  Yes Sowles, Drue Stager, MD  metFORMIN (GLUCOPHAGE) 500 MG tablet Take 1 tablet (500 mg total) by mouth daily with breakfast. 04/21/18  Yes Sowles, Drue Stager, MD  omeprazole (PRILOSEC) 20 MG capsule Take 1 capsule (20 mg total) by mouth 2 (two) times daily before a meal. 07/15/18  Yes Chrystle Murillo B, MD  oxyCODONE (ROXICODONE) 15 MG immediate release tablet Take 1 tablet by mouth every 4 (four) hours as needed for pain.  05/07/15  Yes [provider]  PRISTIQ 50 MG 24 hr tablet Take 1 tablet (50 mg total) by mouth daily. 05/09/18  Yes Sowles, Drue Stager, MD  QUEtiapine (SEROQUEL) 25 MG tablet Take 1 tablet (25 mg total) by mouth at bedtime. 04/21/18  Yes Sowles, Drue Stager, MD  tiZANidine (ZANAFLEX) 4 MG tablet Take 1 tablet by mouth at bedtime as needed for muscle spasms.  05/07/15  Yes [provider]  varenicline (CHANTIX CONTINUING MONTH PAK) 1 MG tablet Take 1 tablet (1 mg total) by mouth 2 (two) times daily. 08/03/18  Yes Sowles, Drue Stager, MD  varenicline (CHANTIX) 0.5 MG tablet  Take 1 tablet (0.5 mg total) by mouth 2 (two) times daily. Started kit if possible 08/03/18  Yes Sowles, Drue Stager, MD  omeprazole (PRILOSEC) 20 MG capsule Take 1 capsule (20 mg total) by mouth 2 (two) times daily before a meal. 04/13/18 07/28/18  Virgel Manifold, MD    Allergies as of 07/29/2018  . (No Known Allergies)    Family History  Problem Relation Age of Onset  . Heart disease Mother   . Thyroid cancer Mother   . Depression Daughter   . Asthma Daughter     Social History   Socioeconomic History  . Marital status: Legally Separated    Spouse name: Not on file  . Number of children: 4  . Years of education: Not on file  . Highest education level: 12th grade  Occupational History  . Occupation: disabled    Comment: chronic back pain   Social Needs  . Financial resource strain: Not hard at all  . Food insecurity:    Worry: Never true    Inability: Never true  . Transportation needs:    Medical: No    Non-medical: No  Tobacco Use  . Smoking status: Current Every Day Smoker    Packs/day: 1.00    Years: 20.00    Pack years: 20.00    Types: Cigarettes    Start date: 07/26/2002  . Smokeless tobacco: Never Used  Substance and Sexual Activity  . Alcohol use: No    Alcohol/week: 0.0 standard drinks  . Drug use: No  . Sexual activity: Yes    Partners: Male    Birth control/protection: Other-see comments    Comment: Ablation  Lifestyle  . Physical activity:    Days per week: 0 days    Minutes per session: 0 min  . Stress: Not at all  Relationships  . Social connections:    Talks on phone: Patient refused    Gets together: Patient refused    Attends religious service: Patient refused    Active member of club or organization: Patient refused    Attends meetings of clubs or organizations: Patient refused    Relationship status: Separated  . Intimate partner violence:    Fear of current or ex partner: No    Emotionally abused: No    Physically abused: No     Forced sexual activity: No  Other Topics Concern  . Not on file  Social History Narrative  . Not on file    Review of Systems: See HPI, otherwise negative ROS  Physical Exam: BP (!) 144/91   Pulse 67   Temp (!) 96.9 F (36.1 C) (Tympanic)   Resp 18   Ht '5\' 8"'$  (1.727 m)   Wt 88.5 kg   SpO2 100%   BMI 29.65 kg/m  General:   Alert,  pleasant and cooperative in NAD Head:  Normocephalic and atraumatic. Neck:  Supple; no masses  or thyromegaly. Lungs:  Clear throughout to auscultation, normal respiratory effort.    Heart:  +S1, +S2, Regular rate and rhythm, No edema. Abdomen:  Soft, nontender and nondistended. Normal bowel sounds, without guarding, and without rebound.   Neurologic:  Alert and  oriented x4;  grossly normal neurologically.  Impression/Plan: [name] is here for a colonoscopy to be performed for average risk screening and EGD for gastric ulcer Risks, benefits, limitations, and alternatives regarding the procedures have been reviewed with the patient.  Questions have been answered.  All parties agreeable.   Virgel Manifold, MD  08/08/2018, 1:55 PM

## 2018-08-09 ENCOUNTER — Encounter
Admission: RE | Admit: 2018-08-09 | Discharge: 2018-08-09 | Disposition: A | Discharge status: Home / Self Care | Payer: Medicare Other | Source: Ambulatory Visit | Attending: Specialist | Admitting: Specialist

## 2018-08-09 ENCOUNTER — Other Ambulatory Visit: Payer: Self-pay

## 2018-08-09 ENCOUNTER — Telehealth: Payer: Self-pay

## 2018-08-09 DIAGNOSIS — Z01818 Encounter for other preprocedural examination: Secondary | ICD-10-CM | POA: Diagnosis not present

## 2018-08-09 DIAGNOSIS — G5602 Carpal tunnel syndrome, left upper limb: Secondary | ICD-10-CM | POA: Insufficient documentation

## 2018-08-09 HISTORY — DX: Unspecified osteoarthritis, unspecified site: M19.90

## 2018-08-09 LAB — CBC
HCT: 39.3 % (ref 36.0–46.0)
HEMOGLOBIN: 13 g/dL (ref 12.0–15.0)
MCH: 31.6 pg (ref 26.0–34.0)
MCHC: 33.1 g/dL (ref 30.0–36.0)
MCV: 95.4 fL (ref 80.0–100.0)
Platelets: 312 10*3/uL (ref 150–400)
RBC: 4.12 MIL/uL (ref 3.87–5.11)
RDW: 13.2 % (ref 11.5–15.5)
WBC: 10.1 10*3/uL (ref 4.0–10.5)
nRBC: 0 % (ref 0.0–0.2)

## 2018-08-09 NOTE — Telephone Encounter (Signed)
Copied from [place] [phone]. Topic: General - Other >> Aug 09, 2018  8:46 AM Gardiner Ramus wrote: Reason for CRM: Judeen Hammans from [facility] called and stated that she faxed over surgical clearance 07/29/18. She would like to know if we have received this. [phone] ex [phone]  The requested material has previously been faxed but I will fax it over again.

## 2018-08-09 NOTE — Patient Instructions (Signed)
Your procedure is scheduled on: Monday, August 15, 2018 Report to Day Surgery on the 2nd floor of the [facility]. To find out your arrival time, please call [phone] between 1PM - 3PM on: Friday, August 12, 2018  REMEMBER: Instructions that are not followed completely may result in serious medical risk, up to and including death; or upon the discretion of your surgeon and anesthesiologist your surgery may need to be rescheduled.  Do not eat food after midnight the night before surgery.  No gum chewing, lozengers or hard candies.  You may however, drink CLEAR liquids up to 2 hours before you are scheduled to arrive for your surgery. Do not drink anything within 2 hours of the start of your surgery.  Clear liquids include: - water  - apple juice without pulp - gatorade - black coffee or tea (Do NOT add milk or creamers to the coffee or tea) Do NOT drink anything that is not on this list.  No Alcohol for 24 hours before or after surgery.  No Smoking including e-cigarettes for 24 hours prior to surgery.  No chewable tobacco products for at least 6 hours prior to surgery.  No nicotine patches on the day of surgery.  On the morning of surgery brush your teeth with toothpaste and water, you may rinse your mouth with mouthwash if you wish. Do not swallow any toothpaste or mouthwash.  Notify your doctor if there is any change in your medical condition (cold, fever, infection).  Do not wear jewelry, make-up, hairpins, clips or nail polish.  Do not wear lotions, powders, or perfumes. You may wear deodorant.  Do not shave 48 hours prior to surgery.   Contacts and dentures may not be worn into surgery.  Do not bring valuables to the hospital, including drivers license, insurance or credit cards.  [facility] is not responsible for any belongings or valuables.   TAKE THESE MEDICATIONS THE MORNING OF SURGERY:  1.  Omeprazole 2.  Pristiq 3.  Oxycodone (if needed for  pain)  Use CHG Soap as directed on instruction sheet.  Stop Metformin 2 days prior to surgery. Last day to take is Friday, October 11....resume after surgery.  NOW!  Stop Anti-inflammatories (NSAIDS) such as Advil, Aleve, Ibuprofen, Motrin, Naproxen, Naprosyn and Aspirin based products such as Excedrin, Goodys Powder, BC Powder. (May take Tylenol or Acetaminophen if needed.)  NOW!  Stop ANY OVER THE COUNTER supplements until after surgery. (May continue Vitamin D, Vitamin B, and multivitamin.)  Wear comfortable clothing (specific to your surgery type) to the hospital.  If you are being discharged the day of surgery, you will not be allowed to drive home. You will need a responsible adult to drive you home and stay with you that night.   If you are taking public transportation, you will need to have a responsible adult with you. Please confirm with your physician that it is acceptable to use public transportation.   Please call [phone] if you have any questions about these instructions.

## 2018-08-10 ENCOUNTER — Ambulatory Visit: Payer: Medicare Other | Admitting: Family Medicine

## 2018-08-10 DIAGNOSIS — M25511 Pain in right shoulder: Secondary | ICD-10-CM | POA: Diagnosis not present

## 2018-08-10 DIAGNOSIS — M25512 Pain in left shoulder: Secondary | ICD-10-CM | POA: Diagnosis not present

## 2018-08-10 DIAGNOSIS — M545 Low back pain: Secondary | ICD-10-CM | POA: Diagnosis not present

## 2018-08-10 DIAGNOSIS — M542 Cervicalgia: Secondary | ICD-10-CM | POA: Diagnosis not present

## 2018-08-10 DIAGNOSIS — G894 Chronic pain syndrome: Secondary | ICD-10-CM | POA: Diagnosis not present

## 2018-08-10 DIAGNOSIS — M79662 Pain in left lower leg: Secondary | ICD-10-CM | POA: Diagnosis not present

## 2018-08-10 DIAGNOSIS — M25551 Pain in right hip: Secondary | ICD-10-CM | POA: Diagnosis not present

## 2018-08-10 DIAGNOSIS — M25562 Pain in left knee: Secondary | ICD-10-CM | POA: Diagnosis not present

## 2018-08-10 DIAGNOSIS — M25561 Pain in right knee: Secondary | ICD-10-CM | POA: Diagnosis not present

## 2018-08-10 DIAGNOSIS — M25552 Pain in left hip: Secondary | ICD-10-CM | POA: Diagnosis not present

## 2018-08-10 DIAGNOSIS — M79661 Pain in right lower leg: Secondary | ICD-10-CM | POA: Diagnosis not present

## 2018-08-10 LAB — SURGICAL PATHOLOGY

## 2018-08-12 ENCOUNTER — Encounter: Payer: Self-pay | Admitting: Gastroenterology

## 2018-08-14 MED ORDER — CLINDAMYCIN PHOSPHATE 600 MG/50ML IV SOLN
600.0000 mg | INTRAVENOUS | Status: AC
  Administered 2018-08-15: 600 mg via INTRAVENOUS

## 2018-08-14 MED ORDER — CEFAZOLIN SODIUM-DEXTROSE 2-4 GM/100ML-% IV SOLN
2.0000 g | INTRAVENOUS | Status: AC
  Administered 2018-08-15: 2 g via INTRAVENOUS

## 2018-08-15 ENCOUNTER — Encounter
Admission: RE | Disposition: A | Discharge status: Home / Self Care | Payer: Self-pay | Source: Ambulatory Visit | Attending: Specialist

## 2018-08-15 ENCOUNTER — Ambulatory Visit: Payer: Medicare Other | Admitting: Anesthesiology

## 2018-08-15 ENCOUNTER — Other Ambulatory Visit: Payer: Self-pay

## 2018-08-15 ENCOUNTER — Encounter: Payer: Self-pay | Admitting: *Deleted

## 2018-08-15 ENCOUNTER — Ambulatory Visit
Admission: RE | Admit: 2018-08-15 | Discharge: 2018-08-15 | Disposition: A | Discharge status: Home / Self Care | Payer: Medicare Other | Source: Ambulatory Visit | Attending: Specialist | Admitting: Specialist

## 2018-08-15 DIAGNOSIS — Z79899 Other long term (current) drug therapy: Secondary | ICD-10-CM | POA: Diagnosis not present

## 2018-08-15 DIAGNOSIS — J449 Chronic obstructive pulmonary disease, unspecified: Secondary | ICD-10-CM | POA: Diagnosis not present

## 2018-08-15 DIAGNOSIS — G5602 Carpal tunnel syndrome, left upper limb: Secondary | ICD-10-CM | POA: Insufficient documentation

## 2018-08-15 DIAGNOSIS — E785 Hyperlipidemia, unspecified: Secondary | ICD-10-CM | POA: Diagnosis not present

## 2018-08-15 DIAGNOSIS — I10 Essential (primary) hypertension: Secondary | ICD-10-CM | POA: Diagnosis not present

## 2018-08-15 DIAGNOSIS — F329 Major depressive disorder, single episode, unspecified: Secondary | ICD-10-CM | POA: Diagnosis not present

## 2018-08-15 DIAGNOSIS — K219 Gastro-esophageal reflux disease without esophagitis: Secondary | ICD-10-CM | POA: Diagnosis not present

## 2018-08-15 DIAGNOSIS — F17200 Nicotine dependence, unspecified, uncomplicated: Secondary | ICD-10-CM | POA: Insufficient documentation

## 2018-08-15 HISTORY — PX: CARPAL TUNNEL RELEASE: SHX101

## 2018-08-15 LAB — POCT PREGNANCY, URINE: Preg Test, Ur: NEGATIVE

## 2018-08-15 LAB — BASIC METABOLIC PANEL
ANION GAP: 10 (ref 5–15)
BUN: 19 mg/dL (ref 6–20)
CHLORIDE: 102 mmol/L (ref 98–111)
CO2: 27 mmol/L (ref 22–32)
CREATININE: 0.59 mg/dL (ref 0.44–1.00)
Calcium: 8.6 mg/dL — ABNORMAL LOW (ref 8.9–10.3)
GFR calc Af Amer: >60 mL/min (ref >60)
GFR calc non Af Amer: >60 mL/min (ref >60)
GLUCOSE: 89 mg/dL (ref 70–99)
Potassium: 3.9 mmol/L (ref 3.5–5.1)
Sodium: 139 mmol/L (ref 135–145)

## 2018-08-15 SURGERY — CARPAL TUNNEL RELEASE
Anesthesia: General | Site: Hand | Laterality: Left

## 2018-08-15 MED ORDER — HYDROCODONE-ACETAMINOPHEN 5-325 MG PO TABS: 1.0000 | ORAL_TABLET | Freq: Four times a day (QID) | ORAL | 0 refills | Status: DC | PRN

## 2018-08-15 MED ORDER — ONDANSETRON HCL 4 MG/2ML IJ SOLN: 4.0000 mg | Freq: Once | INTRAMUSCULAR | Status: DC | PRN

## 2018-08-15 MED ORDER — FAMOTIDINE 20 MG PO TABS
20.0000 mg | ORAL_TABLET | Freq: Once | ORAL | Status: AC
  Administered 2018-08-15: 20 mg via ORAL

## 2018-08-15 MED ORDER — FENTANYL CITRATE (PF) 100 MCG/2ML IJ SOLN
INTRAMUSCULAR | Status: DC | PRN
  Administered 2018-08-15 (×2): 50 ug via INTRAVENOUS
  Administered 2018-08-15 (×2): 25 ug via INTRAVENOUS
  Administered 2018-08-15: 50 ug via INTRAVENOUS

## 2018-08-15 MED ORDER — BUPIVACAINE HCL (PF) 0.5 % IJ SOLN
INTRAMUSCULAR | Status: AC
  Filled 2018-08-15: qty 30

## 2018-08-15 MED ORDER — HYDROCODONE-ACETAMINOPHEN 7.5-325 MG PO TABS
1.0000 | ORAL_TABLET | Freq: Four times a day (QID) | ORAL | Status: DC | PRN
  Administered 2018-08-15: 1 via ORAL
  Filled 2018-08-15: qty 2

## 2018-08-15 MED ORDER — MELOXICAM 7.5 MG PO TABS
ORAL_TABLET | ORAL | Status: AC
  Filled 2018-08-15: qty 2

## 2018-08-15 MED ORDER — PROPOFOL 10 MG/ML IV BOLUS
INTRAVENOUS | Status: DC | PRN
  Administered 2018-08-15: 200 mg via INTRAVENOUS

## 2018-08-15 MED ORDER — FENTANYL CITRATE (PF) 100 MCG/2ML IJ SOLN
INTRAMUSCULAR | Status: AC
  Administered 2018-08-15: 25 ug via INTRAVENOUS
  Filled 2018-08-15: qty 2

## 2018-08-15 MED ORDER — ONDANSETRON HCL 4 MG/2ML IJ SOLN
INTRAMUSCULAR | Status: DC | PRN
  Administered 2018-08-15: 4 mg via INTRAVENOUS

## 2018-08-15 MED ORDER — DEXAMETHASONE SODIUM PHOSPHATE 10 MG/ML IJ SOLN
INTRAMUSCULAR | Status: DC | PRN
  Administered 2018-08-15: 5 mg via INTRAVENOUS

## 2018-08-15 MED ORDER — BUPIVACAINE HCL (PF) 0.5 % IJ SOLN
INTRAMUSCULAR | Status: DC | PRN
  Administered 2018-08-15: 16 mL

## 2018-08-15 MED ORDER — GABAPENTIN 300 MG PO CAPS
300.0000 mg | ORAL_CAPSULE | ORAL | Status: AC
  Administered 2018-08-15: 300 mg via ORAL

## 2018-08-15 MED ORDER — FENTANYL CITRATE (PF) 100 MCG/2ML IJ SOLN
INTRAMUSCULAR | Status: AC
  Filled 2018-08-15: qty 2

## 2018-08-15 MED ORDER — ACETAMINOPHEN 10 MG/ML IV SOLN
INTRAVENOUS | Status: AC
  Filled 2018-08-15: qty 100

## 2018-08-15 MED ORDER — GLYCOPYRROLATE 0.2 MG/ML IJ SOLN
INTRAMUSCULAR | Status: AC
  Filled 2018-08-15: qty 1

## 2018-08-15 MED ORDER — ONDANSETRON HCL 4 MG/2ML IJ SOLN
INTRAMUSCULAR | Status: AC
  Filled 2018-08-15: qty 2

## 2018-08-15 MED ORDER — FENTANYL CITRATE (PF) 100 MCG/2ML IJ SOLN
25.0000 ug | INTRAMUSCULAR | Status: DC | PRN
  Administered 2018-08-15 (×4): 25 ug via INTRAVENOUS

## 2018-08-15 MED ORDER — HYDROMORPHONE HCL 1 MG/ML IJ SOLN
0.5000 mg | INTRAMUSCULAR | Status: DC | PRN
  Administered 2018-08-15 (×3): 0.5 mg via INTRAVENOUS

## 2018-08-15 MED ORDER — CEFAZOLIN SODIUM-DEXTROSE 2-4 GM/100ML-% IV SOLN
INTRAVENOUS | Status: AC
  Filled 2018-08-15: qty 100

## 2018-08-15 MED ORDER — LABETALOL HCL 5 MG/ML IV SOLN
10.0000 mg | INTRAVENOUS | Status: DC | PRN
  Administered 2018-08-15 (×2): 5 mg via INTRAVENOUS
  Administered 2018-08-15: 10 mg via INTRAVENOUS

## 2018-08-15 MED ORDER — CHLORHEXIDINE GLUCONATE CLOTH 2 % EX PADS
6.0000 | MEDICATED_PAD | Freq: Once | CUTANEOUS | Status: AC
  Administered 2018-08-15: 6 via TOPICAL

## 2018-08-15 MED ORDER — LABETALOL HCL 5 MG/ML IV SOLN
INTRAVENOUS | Status: AC
  Administered 2018-08-15: 5 mg via INTRAVENOUS
  Filled 2018-08-15: qty 4

## 2018-08-15 MED ORDER — MELOXICAM 15 MG PO TABS: 15.0000 mg | ORAL_TABLET | Freq: Every day | ORAL | 3 refills | Status: DC

## 2018-08-15 MED ORDER — ACETAMINOPHEN 10 MG/ML IV SOLN
INTRAVENOUS | Status: DC | PRN
  Administered 2018-08-15: 1000 mg via INTRAVENOUS

## 2018-08-15 MED ORDER — HYDROMORPHONE HCL 1 MG/ML IJ SOLN
INTRAMUSCULAR | Status: AC
  Administered 2018-08-15: 0.5 mg via INTRAVENOUS
  Filled 2018-08-15: qty 1

## 2018-08-15 MED ORDER — DEXAMETHASONE SODIUM PHOSPHATE 10 MG/ML IJ SOLN
INTRAMUSCULAR | Status: AC
  Filled 2018-08-15: qty 1

## 2018-08-15 MED ORDER — CLINDAMYCIN PHOSPHATE 600 MG/50ML IV SOLN
INTRAVENOUS | Status: AC
  Filled 2018-08-15: qty 50

## 2018-08-15 MED ORDER — HYDROCODONE-ACETAMINOPHEN 5-325 MG PO TABS: 1.0000 | ORAL_TABLET | Freq: Four times a day (QID) | ORAL | Status: DC | PRN

## 2018-08-15 MED ORDER — GABAPENTIN 400 MG PO CAPS: 400.0000 mg | ORAL_CAPSULE | Freq: Three times a day (TID) | ORAL | 3 refills | Status: DC

## 2018-08-15 MED ORDER — HYDROCODONE-ACETAMINOPHEN 5-325 MG PO TABS
ORAL_TABLET | ORAL | Status: AC
  Administered 2018-08-15: 1
  Filled 2018-08-15: qty 1

## 2018-08-15 MED ORDER — MELOXICAM 7.5 MG PO TABS
15.0000 mg | ORAL_TABLET | ORAL | Status: AC
  Administered 2018-08-15: 15 mg via ORAL

## 2018-08-15 MED ORDER — PROPOFOL 10 MG/ML IV BOLUS
INTRAVENOUS | Status: AC
  Filled 2018-08-15: qty 20

## 2018-08-15 MED ORDER — HYDRALAZINE HCL 20 MG/ML IJ SOLN
INTRAMUSCULAR | Status: AC
  Administered 2018-08-15: 10 mg via INTRAVENOUS
  Filled 2018-08-15: qty 1

## 2018-08-15 MED ORDER — GABAPENTIN 300 MG PO CAPS
ORAL_CAPSULE | ORAL | Status: AC
  Filled 2018-08-15: qty 1

## 2018-08-15 MED ORDER — GLYCOPYRROLATE 0.2 MG/ML IJ SOLN
INTRAMUSCULAR | Status: DC | PRN
  Administered 2018-08-15: 0.2 mg via INTRAVENOUS

## 2018-08-15 MED ORDER — FAMOTIDINE 20 MG PO TABS
ORAL_TABLET | ORAL | Status: AC
  Filled 2018-08-15: qty 1

## 2018-08-15 MED ORDER — SEVOFLURANE IN SOLN
RESPIRATORY_TRACT | Status: AC
  Filled 2018-08-15: qty 250

## 2018-08-15 MED ORDER — HYDRALAZINE HCL 20 MG/ML IJ SOLN: 10.0000 mg | Freq: Once | INTRAMUSCULAR | Status: DC

## 2018-08-15 MED ORDER — MIDAZOLAM HCL 2 MG/2ML IJ SOLN
INTRAMUSCULAR | Status: AC
  Filled 2018-08-15: qty 2

## 2018-08-15 MED ORDER — HYDRALAZINE HCL 20 MG/ML IJ SOLN
10.0000 mg | Freq: Four times a day (QID) | INTRAMUSCULAR | Status: AC | PRN
  Administered 2018-08-15 (×2): 10 mg via INTRAVENOUS

## 2018-08-15 MED ORDER — LACTATED RINGERS IV SOLN
INTRAVENOUS | Status: DC
  Administered 2018-08-15: 10:00:00 via INTRAVENOUS

## 2018-08-15 SURGICAL SUPPLY — 27 items
BLADE SURG MINI STRL (BLADE) ×3 IMPLANT
BNDG ESMARK 4X12 TAN STRL LF (GAUZE/BANDAGES/DRESSINGS) ×3 IMPLANT
CANISTER SUCT 1200ML W/VALVE (MISCELLANEOUS) ×3 IMPLANT
CHLORAPREP W/TINT 26ML (MISCELLANEOUS) ×3 IMPLANT
COVER WAND RF STERILE (DRAPES) ×3 IMPLANT
CUFF TOURN 18 STER (MISCELLANEOUS) ×3 IMPLANT
DRSG GAUZE FLUFF 36X18 (GAUZE/BANDAGES/DRESSINGS) ×3 IMPLANT
ELECT REM PT RETURN 9FT ADLT (ELECTROSURGICAL) ×3
ELECTRODE REM PT RTRN 9FT ADLT (ELECTROSURGICAL) ×1 IMPLANT
GAUZE PETRO XEROFOAM 1X8 (MISCELLANEOUS) ×3 IMPLANT
GLOVE BIO SURGEON STRL SZ8 (GLOVE) ×3 IMPLANT
GOWN STRL REUS W/ TWL LRG LVL3 (GOWN DISPOSABLE) ×1 IMPLANT
GOWN STRL REUS W/TWL LRG LVL3 (GOWN DISPOSABLE) ×2
GOWN STRL REUS W/TWL LRG LVL4 (GOWN DISPOSABLE) ×3 IMPLANT
KIT TURNOVER KIT A (KITS) ×3 IMPLANT
NS IRRIG 500ML POUR BTL (IV SOLUTION) ×3 IMPLANT
PACK EXTREMITY ARMC (MISCELLANEOUS) ×3 IMPLANT
PAD PREP 24X41 OB/GYN DISP (PERSONAL CARE ITEMS) ×3 IMPLANT
PADDING CAST 4IN STRL (MISCELLANEOUS) ×2
PADDING CAST BLEND 4X4 STRL (MISCELLANEOUS) ×1 IMPLANT
SPLINT CAST 1 STEP 3X12 (MISCELLANEOUS) ×3 IMPLANT
STOCKINETTE BIAS CUT 4 980044 (GAUZE/BANDAGES/DRESSINGS) ×3 IMPLANT
STOCKINETTE STRL 4IN 9604848 (GAUZE/BANDAGES/DRESSINGS) ×3 IMPLANT
SUT ETHILON 4-0 (SUTURE) ×2
SUT ETHILON 4-0 FS2 18XMFL BLK (SUTURE) ×1
SUT ETHILON 5-0 FS-2 18 BLK (SUTURE) IMPLANT
SUTURE ETHLN 4-0 FS2 18XMF BLK (SUTURE) ×1 IMPLANT

## 2018-08-15 NOTE — H&P (Signed)
THE PATIENT WAS SEEN PRIOR TO SURGERY TODAY.  HISTORY, ALLERGIES, HOME MEDICATIONS AND OPERATIVE PROCEDURE WERE REVIEWED. RISKS AND BENEFITS OF SURGERY DISCUSSED WITH PATIENT AGAIN.  NO CHANGES FROM INITIAL HISTORY AND PHYSICAL NOTED.    

## 2018-08-15 NOTE — Discharge Instructions (Signed)

## 2018-08-15 NOTE — OR Nursing (Signed)
Pt verbalizes understanding of not taking oxycodone (at home) and Norco (new rx given by Dr. Sabra Heck) at the same time

## 2018-08-15 NOTE — Anesthesia Procedure Notes (Signed)
Procedure Name: LMA Insertion Date/Time: 08/15/2018 11:05 AM Performed by: Jonna Clark, CRNA Pre-anesthesia Checklist: Patient identified, Patient being monitored, Timeout performed, Emergency Drugs available and Suction available Patient Re-evaluated:Patient Re-evaluated prior to induction Oxygen Delivery Method: Circle system utilized Preoxygenation: Pre-oxygenation with 100% oxygen Induction Type: IV induction Ventilation: Mask ventilation without difficulty LMA: LMA inserted LMA Size: 4.0 Tube type: Oral Number of attempts: 1 Placement Confirmation: positive ETCO2 and breath sounds checked- equal and bilateral Tube secured with: Tape Dental Injury: Teeth and Oropharynx as per pre-operative assessment

## 2018-08-15 NOTE — Anesthesia Post-op Follow-up Note (Signed)
Anesthesia QCDR form completed.        

## 2018-08-15 NOTE — Op Note (Signed)
08/15/2018  11:50 AM  PATIENT:  [name]    PRE-OPERATIVE DIAGNOSIS: LEFT CARPAL TUNNEL SYNDROME  POST-OPERATIVE DIAGNOSIS: LEFT CARPAL TUNNEL SYNDROME  PROCEDURE:  LEFT CARPAL TUNNEL RELEASE  SURGEON: Park Breed, MD  TOURNIQUET TIME: 26  MIN   ANESTHESIA:   General  PREOPERATIVE INDICATIONS:  [name] is a  57 y.o. female with a diagnosis of left carpal tunnel syndrome who failed conservative measures and elected for surgical management.    The risks benefits and alternatives were discussed with the patient preoperatively including but not limited to the risks of infection, bleeding, nerve injury, incomplete relief of symptoms, pillar pain, cardiopulmonary complications, the need for revision surgery, among others, and the patient was willing to proceed.  OPERATIVE FINDINGS: Thickened volar ligament and nerve compression.  OPERATIVE PROCEDURE: The patient is brought to the operating room placed in the supine position. General anesthesia was administered. The left upper extremity was prepped and draped in usual sterile fashion. Time out was performed. The arm was elevated and exsanguinated and the tourniquet was inflated. Incision was made in line with the radial border of the ring finger. The carpal tunnel transverse fascia was identified, cleaned, and incised sharply. The common sensory branches were visualized along with the superficial palmar arch and protected.  The median nerve was protected below. A Kelly clamp was  placed underneath the transverse carpal ligament, protecting the nerve. I released the ligament completely, and then released the proximal distal volar forearm fascia. The nerve was identified, and visualized, and protected throughout the case. The motor branch was intact upon inspection. No masses or abnormalities were identified in the ulnar bursa.  The wounds were irrigated copiously and the skin closed with nylon. The wound was injected with 1/2 %  marcaine followed by a sterile dressing and volar splint. Tourniquet was deflated with good return of blood flow to all fingers. Sponge and needle counts were correct.  The patient tolerated this well, with no complications. The patient was awakened and taken to recovery in good condition.

## 2018-08-15 NOTE — Transfer of Care (Signed)
Immediate Anesthesia Transfer of Care Note  Patient: [name]  Procedure(s) Performed: CARPAL TUNNEL RELEASE (Left Hand)  Patient Location: PACU  Anesthesia Type:General  Level of Consciousness: awake, alert  and oriented  Airway & Oxygen Therapy: Patient Spontanous Breathing and Patient connected to face mask oxygen  Post-op Assessment: Report given to RN and Post -op Vital signs reviewed and stable  Post vital signs: Reviewed and stable  Last Vitals:  Vitals Value Taken Time  BP 204/118 08/15/2018 12:00 PM  Temp 36.2 C 08/15/2018 11:59 AM  Pulse 98 08/15/2018 12:00 PM  Resp 16 08/15/2018 12:00 PM  SpO2 100 % 08/15/2018 12:00 PM  Vitals shown include unvalidated device data.  Last Pain:  Vitals:   08/15/18 0926  TempSrc: Temporal  PainSc:          Complications: No apparent anesthesia complications

## 2018-08-15 NOTE — Anesthesia Preprocedure Evaluation (Signed)
Anesthesia Evaluation  Patient identified by MRN, date of birth, ID band Patient awake    Reviewed: Allergy & Precautions, H&P , NPO status , Patient's Chart, lab work & pertinent test results, reviewed documented beta blocker date and time   Airway Mallampati: II  TM Distance: >3 FB Neck ROM: full    Dental  (+) Teeth Intact   Pulmonary neg pulmonary ROS, COPD, Current Smoker,    Pulmonary exam normal        Cardiovascular Exercise Tolerance: Good hypertension, On Medications negative cardio ROS Normal cardiovascular exam Rate:Normal     Neuro/Psych PSYCHIATRIC DISORDERS Depression  Neuromuscular disease negative neurological ROS  negative psych ROS   GI/Hepatic negative GI ROS, Neg liver ROS, PUD, GERD  Medicated,  Endo/Other  negative endocrine ROS  Renal/GU negative Renal ROS  negative genitourinary   Musculoskeletal   Abdominal   Peds  Hematology negative hematology ROS (+)   Anesthesia Other Findings   Reproductive/Obstetrics negative OB ROS                             Anesthesia Physical Anesthesia Plan  ASA: III  Anesthesia Plan: General LMA   Post-op Pain Management:    Induction:   PONV Risk Score and Plan:   Airway Management Planned:   Additional Equipment:   Intra-op Plan:   Post-operative Plan:   Informed Consent: I have reviewed the patients History and Physical, chart, labs and discussed the procedure including the risks, benefits and alternatives for the proposed anesthesia with the patient or authorized representative who has indicated his/her understanding and acceptance.     Plan Discussed with: CRNA  Anesthesia Plan Comments:         Anesthesia Quick Evaluation

## 2018-08-16 ENCOUNTER — Encounter: Payer: Self-pay | Admitting: Specialist

## 2018-08-16 NOTE — Anesthesia Postprocedure Evaluation (Signed)
Anesthesia Post Note  Patient: [name]  Procedure(s) Performed: CARPAL TUNNEL RELEASE (Left Hand)  Patient location during evaluation: PACU Anesthesia Type: General Level of consciousness: awake and alert Pain management: pain level controlled Vital Signs Assessment: post-procedure vital signs reviewed and stable Respiratory status: spontaneous breathing, nonlabored ventilation, respiratory function stable and patient connected to nasal cannula oxygen Cardiovascular status: blood pressure returned to baseline and stable Postop Assessment: no apparent nausea or vomiting Anesthetic complications: no     Last Vitals:  Vitals:   08/15/18 1300 08/15/18 1331  BP: (!) 189/87 (!) 162/80  Pulse: 75 72  Resp:  16  Temp: (!) 36.1 C   SpO2:  100%    Last Pain:  Vitals:   08/16/18 0827  TempSrc:   PainSc: 5                  Molli Barrows

## 2018-09-01 ENCOUNTER — Other Ambulatory Visit: Payer: Self-pay | Admitting: Family Medicine

## 2018-09-01 DIAGNOSIS — Z716 Tobacco abuse counseling: Secondary | ICD-10-CM

## 2018-09-01 NOTE — Telephone Encounter (Signed)
Copied from [place]. Topic: Quick Communication - Rx Refill/Question >> Sep 01, 2018  8:35 AM Reyne Dumas L wrote: Medication: varenicline (CHANTIX) 0.5 MG tablet  Has the patient contacted their pharmacy? No - pt states she will take her last one tonight (Agent: If no, request that the patient contact the pharmacy for the refill.) (Agent: If yes, when and what did the pharmacy advise?)  Preferred Pharmacy (with phone number or street name): [facility], [place], [place] [phone] (Phone) [phone] (Fax)  Agent: Please be advised that RX refills may take up to 3 business days. We ask that you follow-up with your pharmacy.

## 2018-09-12 DIAGNOSIS — M542 Cervicalgia: Secondary | ICD-10-CM | POA: Diagnosis not present

## 2018-09-12 DIAGNOSIS — M25512 Pain in left shoulder: Secondary | ICD-10-CM | POA: Diagnosis not present

## 2018-09-12 DIAGNOSIS — M79662 Pain in left lower leg: Secondary | ICD-10-CM | POA: Diagnosis not present

## 2018-09-12 DIAGNOSIS — M5431 Sciatica, right side: Secondary | ICD-10-CM | POA: Diagnosis not present

## 2018-09-12 DIAGNOSIS — M25551 Pain in right hip: Secondary | ICD-10-CM | POA: Diagnosis not present

## 2018-09-12 DIAGNOSIS — G894 Chronic pain syndrome: Secondary | ICD-10-CM | POA: Diagnosis not present

## 2018-09-12 DIAGNOSIS — M79661 Pain in right lower leg: Secondary | ICD-10-CM | POA: Diagnosis not present

## 2018-09-12 DIAGNOSIS — M25561 Pain in right knee: Secondary | ICD-10-CM | POA: Diagnosis not present

## 2018-09-12 DIAGNOSIS — M5432 Sciatica, left side: Secondary | ICD-10-CM | POA: Diagnosis not present

## 2018-09-12 DIAGNOSIS — M25552 Pain in left hip: Secondary | ICD-10-CM | POA: Diagnosis not present

## 2018-09-12 DIAGNOSIS — M25562 Pain in left knee: Secondary | ICD-10-CM | POA: Diagnosis not present

## 2018-09-23 ENCOUNTER — Other Ambulatory Visit: Payer: Self-pay

## 2018-09-23 ENCOUNTER — Emergency Department
Admission: EM | Admit: 2018-09-23 | Discharge: 2018-09-23 | Disposition: A | Discharge status: Home / Self Care | Payer: Medicare Other | Attending: Emergency Medicine | Admitting: Emergency Medicine

## 2018-09-23 ENCOUNTER — Emergency Department: Payer: Medicare Other

## 2018-09-23 ENCOUNTER — Encounter: Payer: Self-pay | Admitting: Emergency Medicine

## 2018-09-23 DIAGNOSIS — Z9049 Acquired absence of other specified parts of digestive tract: Secondary | ICD-10-CM | POA: Diagnosis not present

## 2018-09-23 DIAGNOSIS — I2510 Atherosclerotic heart disease of native coronary artery without angina pectoris: Secondary | ICD-10-CM | POA: Insufficient documentation

## 2018-09-23 DIAGNOSIS — Z79899 Other long term (current) drug therapy: Secondary | ICD-10-CM | POA: Diagnosis not present

## 2018-09-23 DIAGNOSIS — F329 Major depressive disorder, single episode, unspecified: Secondary | ICD-10-CM | POA: Insufficient documentation

## 2018-09-23 DIAGNOSIS — I10 Essential (primary) hypertension: Secondary | ICD-10-CM | POA: Insufficient documentation

## 2018-09-23 DIAGNOSIS — F17210 Nicotine dependence, cigarettes, uncomplicated: Secondary | ICD-10-CM | POA: Insufficient documentation

## 2018-09-23 DIAGNOSIS — J449 Chronic obstructive pulmonary disease, unspecified: Secondary | ICD-10-CM | POA: Insufficient documentation

## 2018-09-23 DIAGNOSIS — M25561 Pain in right knee: Secondary | ICD-10-CM | POA: Diagnosis not present

## 2018-09-23 DIAGNOSIS — M25461 Effusion, right knee: Secondary | ICD-10-CM | POA: Diagnosis not present

## 2018-09-23 DIAGNOSIS — M7989 Other specified soft tissue disorders: Secondary | ICD-10-CM | POA: Diagnosis not present

## 2018-09-23 DIAGNOSIS — M1711 Unilateral primary osteoarthritis, right knee: Secondary | ICD-10-CM | POA: Diagnosis not present

## 2018-09-23 LAB — SYNOVIAL CELL COUNT + DIFF, W/ CRYSTALS
Crystals, Fluid: NONE SEEN
EOSINOPHILS-SYNOVIAL: 0 %
Lymphocytes-Synovial Fld: 5 %
MONOCYTE-MACROPHAGE-SYNOVIAL FLUID: 30 %
NEUTROPHIL, SYNOVIAL: 65 %
Other Cells-SYN: 0
WBC, SYNOVIAL: 940 /mm3 — AB (ref 0–200)

## 2018-09-23 MED ORDER — PENTAFLUOROPROP-TETRAFLUOROETH EX AERO
INHALATION_SPRAY | Freq: Once | CUTANEOUS | Status: AC
  Administered 2018-09-23: 1 via TOPICAL

## 2018-09-23 MED ORDER — PENTAFLUOROPROP-TETRAFLUOROETH EX AERO
INHALATION_SPRAY | CUTANEOUS | Status: AC
  Administered 2018-09-23: 1 via TOPICAL
  Filled 2018-09-23: qty 30

## 2018-09-23 MED ORDER — MELOXICAM 15 MG PO TABS: 15.0000 mg | ORAL_TABLET | Freq: Every day | ORAL | 0 refills | Status: DC

## 2018-09-23 MED ORDER — MELOXICAM 7.5 MG PO TABS
15.0000 mg | ORAL_TABLET | Freq: Once | ORAL | Status: AC
  Administered 2018-09-23: 15 mg via ORAL
  Filled 2018-09-23: qty 2

## 2018-09-23 NOTE — ED Notes (Signed)
See triage note  Presents with pain and swelling to right knee  States sx's started about 1 week ago  Denies any injury  Has been using ice and elevation

## 2018-09-23 NOTE — ED Provider Notes (Signed)
[facility] Emergency Department Provider Note  ____________________________________________  Time seen: Approximately 4:41 PM  I have reviewed the triage vital signs and the nursing notes.   HISTORY  Chief Complaint Knee Pain    HPI [name] is a 57 y.o. female who presents the emergency department complaining of right knee pain and swelling.  Patient reports that she has had nontraumatic kidney pain and edema for the past  1 week.  Patient reports that she has bad arthritis but denies any trauma to the knee.  She denies any warmth or erythema to the knee.  No history of gout.  Patient reports that she has had a cortisone injection into this knee before given her osteoarthritis.  Patient denies any other complaint of headache, fevers or chills, chest pain, shortness of breath, abdominal pain, nausea vomiting.  No improvement with chronic pain medicine.  No other medications for his complaint prior to arrival.   Past Medical History:  Diagnosis Date  . Arthritis    bilateral knees  . Contact dermatitis and other eczema, due to unspecified cause   . COPD (chronic obstructive pulmonary disease) ([place])   . Depressive disorder   . Dysmetabolic syndrome X   . GERD (gastroesophageal reflux disease)   . Hyperlipidemia   . Hypertension   . IBS (irritable bowel syndrome)   . Insomnia   . Lumbago   . Nonspecific abnormal electrocardiogram (ECG) (EKG)   . Other ovarian failure(256.39)   . Postmenopausal atrophic vaginitis   . Symptomatic menopausal or female climacteric states   . Unspecified vitamin D deficiency     Patient Active Problem List   Diagnosis Date Noted  . Peptic ulcer   . Encounter for screening colonoscopy   . Benign neoplasm of ascending colon   . Diverticulosis of large intestine without diverticulitis   . Simple chronic bronchitis ([place]) 08/03/2018  . Pancreatic lesion 04/21/2018  . Abdominal aortic atherosclerosis ([place]) 04/21/2018   . Reflux esophagitis   . Stomach irritation   . Acute peptic ulcer of stomach   . Gastric polyp   . Abdominal pain, epigastric   . Trigger thumb of right hand 02/25/2018  . Carpal tunnel syndrome on both sides 10/21/2017  . CRP elevated 08/09/2017  . Elevated C-reactive protein 07/28/2017  . Osteoarthritis of both knees 02/19/2016  . COPD, mild ([place]) 05/08/2015  . Vitamin D deficiency 05/07/2015  . Metabolic syndrome 76/28/3151  . Eczema 05/07/2015  . Chronic insomnia 05/07/2015  . Hypertension, benign 05/07/2015  . Dyslipidemia 05/07/2015  . Hyperglycemia 05/07/2015  . Chronic low back pain 05/07/2015  . IBS (irritable bowel syndrome) 05/07/2015  . Gastroesophageal reflux disease without esophagitis 05/07/2015  . Major depression, chronic ([place]) 05/07/2015  . Menopausal syndrome (hot flashes) 05/07/2015  . History of postmenopausal bleeding 01/14/2015    Past Surgical History:  Procedure Laterality Date  . BACK SURGERY    . BREAST BIOPSY Left    neg  . BREAST LUMPECTOMY Left   . CARPAL TUNNEL RELEASE Left 08/15/2018   Procedure: CARPAL TUNNEL RELEASE;  Surgeon: Earnestine Leys, MD;  Location: [facility] ORS;  Service: Orthopedics;  Laterality: Left;  . CERVICAL DISCECTOMY     x 2; metal plate  . CHOLECYSTECTOMY    . COLONOSCOPY WITH PROPOFOL N/A 08/08/2018   Procedure: COLONOSCOPY WITH PROPOFOL;  Surgeon: Virgel Manifold, MD;  Location: [facility] ENDOSCOPY;  Service: Endoscopy;  Laterality: N/A;  . DILATATION & CURETTAGE/HYSTEROSCOPY WITH MYOSURE N/A 03/21/2015   Procedure:  DILATATION & CURETTAGE/HYSTEROSCOPY;  Surgeon: Aletha Halim, MD;  Location: [facility] ORS;  Service: Gynecology;  Laterality: N/A;  . DILATION AND CURETTAGE OF UTERUS    . ENDOMETRIAL ABLATION    . ESOPHAGOGASTRODUODENOSCOPY (EGD) WITH PROPOFOL N/A 04/13/2018   Procedure: ESOPHAGOGASTRODUODENOSCOPY (EGD) WITH PROPOFOL;  Surgeon: Virgel Manifold, MD;  Location: [facility] ENDOSCOPY;  Service: Endoscopy;   Laterality: N/A;  . ESOPHAGOGASTRODUODENOSCOPY (EGD) WITH PROPOFOL N/A 08/08/2018   Procedure: ESOPHAGOGASTRODUODENOSCOPY (EGD) WITH PROPOFOL;  Surgeon: Virgel Manifold, MD;  Location: [facility] ENDOSCOPY;  Service: Endoscopy;  Laterality: N/A;  . SPINAL FUSION     lumbar x2  . TONSILLECTOMY    . TUBAL LIGATION      Prior to Admission medications   Medication Sig Start Date End Date Taking? Authorizing Provider  acetaminophen (TYLENOL) 500 MG tablet Take 1 tablet (500 mg total) by mouth every 6 (six) hours as needed. 02/19/16   Steele Sizer, MD  amitriptyline (ELAVIL) 10 MG tablet Take 1 tablet (10 mg total) by mouth at bedtime. 04/21/18   Steele Sizer, MD  atorvastatin (LIPITOR) 40 MG tablet Take 1 tablet (40 mg total) by mouth daily. 04/21/18   Steele Sizer, MD  cholecalciferol (VITAMIN D) 1000 units tablet Take 1,000 Units by mouth daily.    [provider]  famotidine (PEPCID) 40 MG tablet Take 40 mg by mouth daily as needed for heartburn.     [provider]  gabapentin (NEURONTIN) 300 MG capsule Take 1 capsule by mouth 2 (two) times daily.  05/07/15   [provider]  gabapentin (NEURONTIN) 400 MG capsule Take 1 capsule (400 mg total) by mouth 3 (three) times daily. 08/15/18   Earnestine Leys, MD  HYDROcodone-acetaminophen (NORCO) 5-325 MG tablet Take 1-2 tablets by mouth every 6 (six) hours as needed. 08/15/18   Earnestine Leys, MD  lidocaine (LIDODERM) 5 % Place 1 patch onto the skin daily. Remove & Discard patch within 12 hours or as directed by MD Patient taking differently: Place 1 patch onto the skin daily as needed (pain). Remove & Discard patch within 12 hours or as directed by MD 10/21/17   Steele Sizer, MD  losartan-hydrochlorothiazide (HYZAAR) 100-25 MG tablet Take 1 tablet by mouth daily. 04/21/18   Steele Sizer, MD  meloxicam (MOBIC) 15 MG tablet Take 1 tablet (15 mg total) by mouth daily. 08/15/18   Earnestine Leys, MD  meloxicam (MOBIC) 15  MG tablet Take 1 tablet (15 mg total) by mouth daily. 09/23/18   Cuthriell, Charline Bills, PA-C  metFORMIN (GLUCOPHAGE) 500 MG tablet Take 1 tablet (500 mg total) by mouth daily with breakfast. 04/21/18   Steele Sizer, MD  omeprazole (PRILOSEC) 20 MG capsule Take 1 capsule (20 mg total) by mouth 2 (two) times daily before a meal. 04/13/18 08/09/18  Virgel Manifold, MD  omeprazole (PRILOSEC) 20 MG capsule Take 1 capsule (20 mg total) by mouth 2 (two) times daily before a meal. 07/15/18   Tahiliani, Lennette Bihari, MD  oxyCODONE (ROXICODONE) 15 MG immediate release tablet Take 1 tablet by mouth every 4 (four) hours as needed for pain.  05/07/15   [provider]  PRISTIQ 50 MG 24 hr tablet Take 1 tablet (50 mg total) by mouth daily. 05/09/18   Steele Sizer, MD  QUEtiapine (SEROQUEL) 25 MG tablet Take 1 tablet (25 mg total) by mouth at bedtime. 04/21/18   Steele Sizer, MD  tiZANidine (ZANAFLEX) 4 MG tablet Take 1 tablet by mouth at bedtime as needed for muscle  spasms.  05/07/15   [provider]  varenicline (CHANTIX CONTINUING MONTH PAK) 1 MG tablet Take 1 tablet (1 mg total) by mouth 2 (two) times daily. 08/03/18   Steele Sizer, MD  varenicline (CHANTIX) 0.5 MG tablet Take 1 tablet (0.5 mg total) by mouth 2 (two) times daily. Started kit if possible 08/03/18   Steele Sizer, MD    Allergies Patient has no known allergies.  Family History  Problem Relation Age of Onset  . Heart disease Mother   . Thyroid cancer Mother   . Depression Daughter   . Asthma Daughter     Social History Social History   Tobacco Use  . Smoking status: Current Every Day Smoker    Packs/day: 1.00    Years: 20.00    Pack years: 20.00    Types: Cigarettes    Start date: 07/26/2002  . Smokeless tobacco: Never Used  Substance Use Topics  . Alcohol use: No    Alcohol/week: 0.0 standard drinks  . Drug use: No     Review of Systems  Constitutional: No fever/chills Eyes: No visual changes.   Cardiovascular: no chest pain. Respiratory: no cough. No SOB. Gastrointestinal: No abdominal pain.  No nausea, no vomiting.   Musculoskeletal: Positive for nontraumatic right knee pain and edema Skin: Negative for rash, abrasions, lacerations, ecchymosis. Neurological: Negative for headaches, focal weakness or numbness. 10-point ROS otherwise negative.  ____________________________________________   PHYSICAL EXAM:  VITAL SIGNS: ED Triage Vitals  Enc Vitals Group     BP 09/23/18 1530 (!) 164/94     Pulse Rate 09/23/18 1530 82     Resp 09/23/18 1530 18     Temp 09/23/18 1530 98.1 F (36.7 C)     Temp Source 09/23/18 1530 Oral     SpO2 09/23/18 1530 96 %     Weight 09/23/18 1531 195 lb (88.5 kg)     Height 09/23/18 1531 _0  (1.626 m)     Head Circumference --      Peak Flow --      Pain Score 09/23/18 1531 10     Pain Loc --      Pain Edu? --      Excl. in [place]? --      Constitutional: Alert and oriented. Well appearing and in no acute distress. Eyes: Conjunctivae are normal. PERRL. EOMI. Head: Atraumatic.  Neck: No stridor.    Cardiovascular: Normal rate, regular rhythm. Normal S1 and S2.  Good peripheral circulation. Respiratory: Normal respiratory effort without tachypnea or retractions. Lungs CTAB. Good air entry to the bases with no decreased or absent breath sounds. Musculoskeletal: Full range of motion to all extremities. No gross deformities appreciated.  Visualization of the right knee reveals mild edema in the suprapatellar region but no other significant visible abnormality to the right knee.  Patient has good flexion and extension.  Palpation reveals global tenderness to palpation with no specific point tenderness.  Positive for ballottement but no other palpable abnormality.  Varus, valgus, Lachman, McMurray's is negative.  Dorsalis pedis pulse intact distally.  Sensation intact distally. Neurologic:  Normal speech and language. No gross focal neurologic deficits  are appreciated.  Skin:  Skin is warm, dry and intact. No rash noted. Psychiatric: Mood and affect are normal. Speech and behavior are normal. Patient exhibits appropriate insight and judgement.   ____________________________________________   LABS (all labs ordered are listed, but only abnormal results are displayed)  Labs Reviewed  SYNOVIAL CELL COUNT + DIFF, W/  CRYSTALS   ____________________________________________  EKG   ____________________________________________  RADIOLOGY I personally viewed and evaluated these images as part of my medical decision making, as well as reviewing the written report by the radiologist.  Dg Knee Complete 4 Views Right  Result Date: 09/23/2018 CLINICAL DATA:  Swelling and pain in the right deep x1 week without known injury. EXAM: RIGHT KNEE - COMPLETE 4+ VIEW COMPARISON:  MRI 12/08/2006 FINDINGS: Moderate to marked medial femorotibial joint space narrowing with spurring is noted consistent with osteoarthritis. Faint chondrocalcinosis of hyaline cartilage is identified. Moderate suprapatellar joint effusion is seen. No acute fracture or joint dislocations. No intra-articular loose bodies. IMPRESSION: Osteoarthritis of the medial femorotibial compartment with moderate joint effusion. No acute osseous abnormality. Electronically Signed   By: Ashley Royalty M.D.   On: 09/23/2018 16:29    ____________________________________________    PROCEDURES  Procedure(s) performed:    .Joint Aspiration/Arthrocentesis Date/Time: 09/23/2018 5:08 PM Performed by: Darletta Moll, PA-C Authorized by: Darletta Moll, PA-C   Consent:    Consent obtained:  Verbal   Consent given by:  Patient   Risks discussed:  Bleeding, pain and incomplete drainage Location:    Location:  Knee   Knee:  R knee Anesthesia (see MAR for exact dosages):    Anesthesia method:  Topical application   Topical anesthesia: Gebauer Spray. Procedure details:     Preparation: Patient was prepped and draped in usual sterile fashion     Needle gauge:  18 G   Ultrasound guidance: no     Approach:  Lateral   Aspirate amount:  40 ml   Aspirate characteristics:  Yellow   Steroid injected: no     Specimen collected: yes   Post-procedure details:    Dressing:  Gauze roll   Patient tolerance of procedure:  Tolerated well, no immediate complications      Medications  meloxicam (MOBIC) tablet 15 mg (has no administration in time range)  pentafluoroprop-tetrafluoroeth (GEBAUERS) aerosol (1 application Topical Given by Other 09/23/18 1714)     ____________________________________________   INITIAL IMPRESSION / ASSESSMENT AND PLAN / ED COURSE  Pertinent labs & imaging results that were available during my care of the patient were reviewed by me and considered in my medical decision making (see chart for details).  Review of the [place] CSRS was performed in accordance of the [place] prior to dispensing any controlled drugs.      Patient's diagnosis is consistent with joint effusion, osteoarthritis of the knee.  Patient presents the emergency department with nontraumatic knee pain and edema.  Patient has known osteoarthritis to the knee, having received cortisone injections in the past.  Patient denies any trauma to the area.  No warmth or erythema.  Differential includes ligament injury, osteoarthritis, joint effusion, septic arthritis, gout.  Symptoms did not exhibit consistent with septic joint or gout.  Given moderate joint effusion, significant pain with ballottement, I offered joint aspiration.  Patient accepts.  This is performed as described above.  Patient tolerated well with no complications.  Since aspiration was performed for symptom relief, I will also send aspirate to the lab for analysis.  I do not suspect at this time gout or septic arthritis.  As such, patient was treated with meloxicam for symptom relief.  Patient does have a history of peptic  ulcer but has had NSAID therapy in the past with no complications.  Patient reports that she is able to take NSAIDs at this time..  Patient is to follow-up  with orthopedics as needed.  Patient is given ED precautions to return to the ED for any worsening or new symptoms.     ____________________________________________  FINAL CLINICAL IMPRESSION(S) / ED DIAGNOSES  Final diagnoses:  Effusion of right knee  Primary osteoarthritis of right knee      NEW MEDICATIONS STARTED DURING THIS VISIT:  ED Discharge Orders         Ordered    meloxicam (MOBIC) 15 MG tablet  Daily     09/23/18 1715              This chart was dictated using voice recognition software/Dragon. Despite best efforts to proofread, errors can occur which can change the meaning. Any change was purely unintentional.    Darletta Moll, PA-C 09/23/18 1715    Harvest Dark, MD 09/23/18 1956

## 2018-09-23 NOTE — ED Triage Notes (Signed)
Pt in via [facility] from home with complaints of swelling and pain to right knee since Friday, pt takes Percocet for chronic back pain, denies any relief with knee pain.  Pt denies any recent injury, reports hx arthritis.  Pt ambulatory to triage.  NAD noted at this time.

## 2018-09-26 DIAGNOSIS — M170 Bilateral primary osteoarthritis of knee: Secondary | ICD-10-CM | POA: Diagnosis not present

## 2018-09-26 DIAGNOSIS — M1711 Unilateral primary osteoarthritis, right knee: Secondary | ICD-10-CM | POA: Diagnosis not present

## 2018-10-12 DIAGNOSIS — M25552 Pain in left hip: Secondary | ICD-10-CM | POA: Diagnosis not present

## 2018-10-12 DIAGNOSIS — M25562 Pain in left knee: Secondary | ICD-10-CM | POA: Diagnosis not present

## 2018-10-12 DIAGNOSIS — G894 Chronic pain syndrome: Secondary | ICD-10-CM | POA: Diagnosis not present

## 2018-10-12 DIAGNOSIS — M79661 Pain in right lower leg: Secondary | ICD-10-CM | POA: Diagnosis not present

## 2018-10-12 DIAGNOSIS — M545 Low back pain: Secondary | ICD-10-CM | POA: Diagnosis not present

## 2018-10-12 DIAGNOSIS — M25551 Pain in right hip: Secondary | ICD-10-CM | POA: Diagnosis not present

## 2018-10-12 DIAGNOSIS — M25512 Pain in left shoulder: Secondary | ICD-10-CM | POA: Diagnosis not present

## 2018-10-12 DIAGNOSIS — M25561 Pain in right knee: Secondary | ICD-10-CM | POA: Diagnosis not present

## 2018-10-12 DIAGNOSIS — M542 Cervicalgia: Secondary | ICD-10-CM | POA: Diagnosis not present

## 2018-10-12 DIAGNOSIS — M79662 Pain in left lower leg: Secondary | ICD-10-CM | POA: Diagnosis not present

## 2018-10-12 DIAGNOSIS — M25511 Pain in right shoulder: Secondary | ICD-10-CM | POA: Diagnosis not present

## 2018-10-13 DIAGNOSIS — M170 Bilateral primary osteoarthritis of knee: Secondary | ICD-10-CM | POA: Diagnosis not present

## 2018-10-20 ENCOUNTER — Ambulatory Visit: Payer: Medicare Other | Admitting: Family Medicine

## 2018-11-02 ENCOUNTER — Other Ambulatory Visit: Payer: Self-pay | Admitting: Family Medicine

## 2018-11-02 DIAGNOSIS — Z716 Tobacco abuse counseling: Secondary | ICD-10-CM

## 2018-11-11 DIAGNOSIS — M542 Cervicalgia: Secondary | ICD-10-CM | POA: Diagnosis not present

## 2018-11-11 DIAGNOSIS — M79662 Pain in left lower leg: Secondary | ICD-10-CM | POA: Diagnosis not present

## 2018-11-11 DIAGNOSIS — G894 Chronic pain syndrome: Secondary | ICD-10-CM | POA: Diagnosis not present

## 2018-11-11 DIAGNOSIS — M79661 Pain in right lower leg: Secondary | ICD-10-CM | POA: Diagnosis not present

## 2018-11-11 DIAGNOSIS — M25512 Pain in left shoulder: Secondary | ICD-10-CM | POA: Diagnosis not present

## 2018-11-11 DIAGNOSIS — M545 Low back pain: Secondary | ICD-10-CM | POA: Diagnosis not present

## 2018-11-11 DIAGNOSIS — M25511 Pain in right shoulder: Secondary | ICD-10-CM | POA: Diagnosis not present

## 2018-11-11 DIAGNOSIS — M25562 Pain in left knee: Secondary | ICD-10-CM | POA: Diagnosis not present

## 2018-11-11 DIAGNOSIS — M25551 Pain in right hip: Secondary | ICD-10-CM | POA: Diagnosis not present

## 2018-11-11 DIAGNOSIS — M25552 Pain in left hip: Secondary | ICD-10-CM | POA: Diagnosis not present

## 2018-11-11 DIAGNOSIS — M25561 Pain in right knee: Secondary | ICD-10-CM | POA: Diagnosis not present

## 2018-11-15 DIAGNOSIS — M170 Bilateral primary osteoarthritis of knee: Secondary | ICD-10-CM | POA: Diagnosis not present

## 2018-11-23 ENCOUNTER — Ambulatory Visit (INDEPENDENT_AMBULATORY_CARE_PROVIDER_SITE_OTHER): Payer: Medicare Other | Admitting: Family Medicine

## 2018-11-23 ENCOUNTER — Encounter: Payer: Self-pay | Admitting: Family Medicine

## 2018-11-23 ENCOUNTER — Telehealth: Payer: Self-pay | Admitting: Family Medicine

## 2018-11-23 ENCOUNTER — Other Ambulatory Visit (HOSPITAL_COMMUNITY)
Admission: RE | Admit: 2018-11-23 | Discharge: 2018-11-23 | Disposition: A | Discharge status: Home / Self Care | Payer: Medicare Other | Source: Ambulatory Visit | Attending: Family Medicine | Admitting: Family Medicine

## 2018-11-23 VITALS — BP 136/84 | HR 97 | Temp 98.1°F | Resp 16 | Ht 69.0 in | Wt 208.1 lb

## 2018-11-23 DIAGNOSIS — I10 Essential (primary) hypertension: Secondary | ICD-10-CM | POA: Diagnosis not present

## 2018-11-23 DIAGNOSIS — I700 Atherosclerosis of aorta: Secondary | ICD-10-CM | POA: Diagnosis not present

## 2018-11-23 DIAGNOSIS — R739 Hyperglycemia, unspecified: Secondary | ICD-10-CM | POA: Diagnosis not present

## 2018-11-23 DIAGNOSIS — Z113 Encounter for screening for infections with a predominantly sexual mode of transmission: Secondary | ICD-10-CM | POA: Insufficient documentation

## 2018-11-23 DIAGNOSIS — Z114 Encounter for screening for human immunodeficiency virus [HIV]: Secondary | ICD-10-CM

## 2018-11-23 DIAGNOSIS — E785 Hyperlipidemia, unspecified: Secondary | ICD-10-CM

## 2018-11-23 DIAGNOSIS — M170 Bilateral primary osteoarthritis of knee: Secondary | ICD-10-CM

## 2018-11-23 DIAGNOSIS — F5104 Psychophysiologic insomnia: Secondary | ICD-10-CM | POA: Diagnosis not present

## 2018-11-23 DIAGNOSIS — F339 Major depressive disorder, recurrent, unspecified: Secondary | ICD-10-CM | POA: Diagnosis not present

## 2018-11-23 DIAGNOSIS — E8881 Metabolic syndrome: Secondary | ICD-10-CM

## 2018-11-23 DIAGNOSIS — J410 Simple chronic bronchitis: Secondary | ICD-10-CM | POA: Diagnosis not present

## 2018-11-23 DIAGNOSIS — K582 Mixed irritable bowel syndrome: Secondary | ICD-10-CM | POA: Diagnosis not present

## 2018-11-23 MED ORDER — QUETIAPINE FUMARATE 25 MG PO TABS: 25.0000 mg | ORAL_TABLET | Freq: Every day | ORAL | 5 refills | Status: DC

## 2018-11-23 MED ORDER — PRISTIQ 50 MG PO TB24: 50.0000 mg | ORAL_TABLET | Freq: Every day | ORAL | 5 refills | Status: DC

## 2018-11-23 MED ORDER — METFORMIN HCL 500 MG PO TABS: 500.0000 mg | ORAL_TABLET | Freq: Every day | ORAL | 5 refills | Status: DC

## 2018-11-23 MED ORDER — OMEPRAZOLE 20 MG PO CPDR: 20.0000 mg | DELAYED_RELEASE_CAPSULE | Freq: Every day | ORAL | 5 refills | Status: DC

## 2018-11-23 MED ORDER — AMITRIPTYLINE HCL 10 MG PO TABS: 10.0000 mg | ORAL_TABLET | Freq: Every day | ORAL | 5 refills | Status: DC

## 2018-11-23 MED ORDER — LOSARTAN POTASSIUM-HCTZ 100-25 MG PO TABS: 1.0000 | ORAL_TABLET | Freq: Every day | ORAL | 5 refills | Status: DC

## 2018-11-23 NOTE — Telephone Encounter (Signed)
Copied from [place]. Topic: Quick Communication - See Telephone Encounter >> Nov 23, 2018 10:36 AM Ahmed Prima L wrote: CRM for notification. See Telephone encounter for: 11/23/18.  Renee with [facility] would like to get Clarification on omeprazole (PRILOSEC) 20 MG capsule. The quantity says 5 pills. Please call pharmacy at [phone]

## 2018-11-23 NOTE — Progress Notes (Signed)
Name: [name]   MRN: [id number]    DOB: 1961-07-19   Date:11/23/2018       Progress Note  Subjective  Chief Complaint  Chief Complaint  Patient presents with  . Follow-up    6 month F/U  . Hypertension  . Depression  . Back Pain  . COPD  . Irritable Bowel Syndrome  . Exposure to STD    Would like to be tested for STDs    HPI  OA both knees: under the care of Ortho, going to see Dr. Harlow Mares soon, she has constant aching pain on both knees, throbbing at times. She may need surgery, she has intermittent effusion   Metabolic syndrome: weight has gone up since last visit.  No longer has diarrhea from Metformin, but only taking one pill daily.She denies polyphagia, polydipsia or polyuria. Discussed GLP-1 agonist, but she states her mother hada history of thyroid cancer.Continue life style modification. Last hgbA1C was 5.8%, recheck next visit   OBS:JGGEZM goal, no longer has dizziness, denies palpitation and shortness of breath Unchanged, continue medication   Obesity: she gained 8 lbs since last visit, and is now in the obese category, she will resume a healthy diet and try to move more   Depression Major: long history of depression, got worse when mother died in 02-10-2017, but is doing better now She states still has down days at most a couple of times in the past two weeks. She is complaint with medication and denies side effects   COPD:She is still smoking but since started on chantix she is down from one pack daily to quarter pack. Shedenies cough,wheezing or SOB. She stopped all inhalers.She does not want medications at this time She is trying to quit smoking now   IBS: doing well at this time, pain under control, she has diarrhea and constipation type   Married but wants to be checked for STI, not sure about him   Patient Active Problem List   Diagnosis Date Noted  . Peptic ulcer   . Encounter for screening colonoscopy   . Benign neoplasm of ascending colon    . Diverticulosis of large intestine without diverticulitis   . Simple chronic bronchitis ([place]) 08/03/2018  . Pancreatic lesion 04/21/2018  . Abdominal aortic atherosclerosis ([place]) 04/21/2018  . Reflux esophagitis   . Stomach irritation   . Acute peptic ulcer of stomach   . Gastric polyp   . Abdominal pain, epigastric   . Trigger thumb of right hand 02/25/2018  . Carpal tunnel syndrome on both sides 10/21/2017  . CRP elevated 08/09/2017  . Elevated C-reactive protein 07/28/2017  . Osteoarthritis of both knees 02/19/2016  . COPD, mild ([place]) 05/08/2015  . Vitamin D deficiency 05/07/2015  . Metabolic syndrome 62/94/7654  . Eczema 05/07/2015  . Chronic insomnia 05/07/2015  . Hypertension, benign 05/07/2015  . Dyslipidemia 05/07/2015  . Hyperglycemia 05/07/2015  . Chronic low back pain 05/07/2015  . IBS (irritable bowel syndrome) 05/07/2015  . Gastroesophageal reflux disease without esophagitis 05/07/2015  . Major depression, chronic ([place]) 05/07/2015  . Menopausal syndrome (hot flashes) 05/07/2015  . History of postmenopausal bleeding 01/14/2015    Past Surgical History:  Procedure Laterality Date  . BACK SURGERY    . BREAST BIOPSY Left    neg  . BREAST LUMPECTOMY Left   . CARPAL TUNNEL RELEASE Left 08/15/2018   Procedure: CARPAL TUNNEL RELEASE;  Surgeon: Earnestine Leys, MD;  Location: [facility];  Service: Orthopedics;  Laterality: Left;  .  CERVICAL DISCECTOMY     x 2; metal plate  . CHOLECYSTECTOMY    . COLONOSCOPY WITH PROPOFOL N/A 08/08/2018   Procedure: COLONOSCOPY WITH PROPOFOL;  Surgeon: Virgel Manifold, MD;  Location: [facility];  Service: Endoscopy;  Laterality: N/A;  . DILATATION & CURETTAGE/HYSTEROSCOPY WITH MYOSURE N/A 03/21/2015   Procedure: DILATATION & CURETTAGE/HYSTEROSCOPY;  Surgeon: Aletha Halim, MD;  Location: [facility];  Service: Gynecology;  Laterality: N/A;  . DILATION AND CURETTAGE OF UTERUS    . ENDOMETRIAL ABLATION    .  ESOPHAGOGASTRODUODENOSCOPY (EGD) WITH PROPOFOL N/A 04/13/2018   Procedure: ESOPHAGOGASTRODUODENOSCOPY (EGD) WITH PROPOFOL;  Surgeon: Virgel Manifold, MD;  Location: [facility];  Service: Endoscopy;  Laterality: N/A;  . ESOPHAGOGASTRODUODENOSCOPY (EGD) WITH PROPOFOL N/A 08/08/2018   Procedure: ESOPHAGOGASTRODUODENOSCOPY (EGD) WITH PROPOFOL;  Surgeon: Virgel Manifold, MD;  Location: [facility];  Service: Endoscopy;  Laterality: N/A;  . SPINAL FUSION     lumbar x2  . TONSILLECTOMY    . TUBAL LIGATION      Family History  Problem Relation Age of Onset  . Heart disease Mother   . Thyroid cancer Mother   . Depression Daughter   . Asthma Daughter     Social History   Socioeconomic History  . Marital status: Legally Separated    Spouse name: Not on file  . Number of children: 4  . Years of education: Not on file  . Highest education level: 12th grade  Occupational History  . Occupation: disabled    Comment: chronic back pain   Social Needs  . Financial resource strain: Not hard at all  . Food insecurity:    Worry: Never true    Inability: Never true  . Transportation needs:    Medical: No    Non-medical: No  Tobacco Use  . Smoking status: Current Every Day Smoker    Packs/day: 0.25    Years: 20.00    Pack years: 5.00    Types: Cigarettes    Start date: 07/26/2002  . Smokeless tobacco: Never Used  Substance and Sexual Activity  . Alcohol use: No    Alcohol/week: 0.0 standard drinks  . Drug use: No  . Sexual activity: Yes    Partners: Male    Birth control/protection: Other-see comments    Comment: Ablation  Lifestyle  . Physical activity:    Days per week: 0 days    Minutes per session: 0 min  . Stress: Not at all  Relationships  . Social connections:    Talks on phone: Patient refused    Gets together: Patient refused    Attends religious service: Patient refused    Active member of club or organization: Patient refused    Attends meetings of  clubs or organizations: Patient refused    Relationship status: Separated  . Intimate partner violence:    Fear of current or ex partner: No    Emotionally abused: No    Physically abused: No    Forced sexual activity: No  Other Topics Concern  . Not on file  Social History Narrative  . Not on file     Current Outpatient Medications:  .  acetaminophen (TYLENOL) 500 MG tablet, Take 1 tablet (500 mg total) by mouth every 6 (six) hours as needed., Disp: 30 tablet, Rfl: 2 .  amitriptyline (ELAVIL) 10 MG tablet, Take 1 tablet (10 mg total) by mouth at bedtime., Disp: 30 tablet, Rfl: 5 .  atorvastatin (LIPITOR) 40 MG tablet, Take 1 tablet (40  mg total) by mouth daily., Disp: 90 tablet, Rfl: 3 .  CHANTIX CONTINUING MONTH PAK 1 MG tablet, TAKE ONE TABLET BY MOUTH TWICE DAILY, Disp: 60 tablet, Rfl: 1 .  cholecalciferol (VITAMIN D) 1000 units tablet, Take 1,000 Units by mouth daily., Disp: , Rfl:  .  gabapentin (NEURONTIN) 300 MG capsule, Take 1 capsule by mouth 2 (two) times daily. , Disp: , Rfl:  .  lidocaine (LIDODERM) 5 %, Place 1 patch onto the skin daily. Remove & Discard patch within 12 hours or as directed by MD (Patient taking differently: Place 1 patch onto the skin daily as needed (pain). Remove & Discard patch within 12 hours or as directed by MD), Disp: 30 patch, Rfl: 0 .  losartan-hydrochlorothiazide (HYZAAR) 100-25 MG tablet, Take 1 tablet by mouth daily., Disp: 30 tablet, Rfl: 5 .  meloxicam (MOBIC) 15 MG tablet, Take 1 tablet (15 mg total) by mouth daily., Disp: 30 tablet, Rfl: 0 .  metFORMIN (GLUCOPHAGE) 500 MG tablet, Take 1 tablet (500 mg total) by mouth daily with breakfast., Disp: 30 tablet, Rfl: 5 .  omeprazole (PRILOSEC) 20 MG capsule, Take 1 capsule (20 mg total) by mouth daily., Disp: 5 capsule, Rfl: 5 .  oxyCODONE (ROXICODONE) 15 MG immediate release tablet, Take 1 tablet by mouth every 4 (four) hours as needed for pain. , Disp: , Rfl:  .  PRISTIQ 50 MG 24 hr tablet, Take  1 tablet (50 mg total) by mouth daily., Disp: 30 tablet, Rfl: 5 .  QUEtiapine (SEROQUEL) 25 MG tablet, Take 1 tablet (25 mg total) by mouth at bedtime., Disp: 30 tablet, Rfl: 5 .  tiZANidine (ZANAFLEX) 4 MG tablet, Take 1 tablet by mouth at bedtime as needed for muscle spasms. , Disp: , Rfl:   No Known Allergies  I personally reviewed active problem list, medication list, allergies, family history, social history with the patient/caregiver today.   ROS  Constitutional: Negative for fever or weight change.  Respiratory: Negative for cough and shortness of breath.   Cardiovascular: Negative for chest pain or palpitations.  Gastrointestinal: Negative for abdominal pain, no bowel changes.  Musculoskeletal: Negative for gait problem or joint swelling.  Skin: Negative for rash.  Neurological: Negative for dizziness or headache.  No other specific complaints in a complete review of systems (except as listed in HPI above).   Objective  Vitals:   11/23/18 0942  BP: 136/84  Pulse: 97  Resp: 16  Temp: 98.1 F (36.7 C)  TempSrc: Oral  SpO2: 99%  Weight: 208 lb 1.6 oz (94.4 kg)  Height: 5\' 9"  (1.753 m)    Body mass index is 30.73 kg/m.  Physical Exam  Constitutional: Patient appears well-developed and well-nourished. Obese  No distress.  HEENT: head atraumatic, normocephalic, pupils equal and reactive to light, e neck supple, throat within normal limits Cardiovascular: Normal rate, regular rhythm and normal heart sounds.  No murmur heard. No BLE edema. Pulmonary/Chest: Effort normal and breath sounds normal. No respiratory distress. Abdominal: Soft.  There is no tenderness. Muscular Skeletal: crepitus with extension of both knees Psychiatric: Patient has a normal mood and affect. behavior is normal. Judgment and thought content normal.   Recent Results (from the past 2160 hour(s))  Synovial cell count + diff, w/ crystals     Status: Abnormal   Collection Time: 09/23/18  5:09 PM   Result Value Ref Range   Color, Synovial YELLOW YELLOW   Appearance-Synovial CLEAR (A) CLEAR   Crystals, Fluid NO CRYSTALS SEEN  WBC, Synovial 940 (H) 0 - 200 /cu mm   Neutrophil, Synovial 65 %   Lymphocytes-Synovial Fld 5 %   Monocyte-Macrophage-Synovial Fluid 30 %   Eosinophils-Synovial 0 %   Other Cells-SYN 0     Comment: Performed at [facility], [address] [place], [address] [address]      PHQ2/9: Depression screen [facility] 2/9 11/23/2018 08/03/2018 07/28/2018 04/21/2018 01/19/2018  Decreased Interest 0 0 0 0 0  Down, Depressed, Hopeless 1 0 0 1 0  PHQ - 2 Score 1 0 0 1 0  Altered sleeping 1 0 0 1 1  Tired, decreased energy 0 1 0 0 1  Change in appetite 0 1 0 0 0  Feeling bad or failure about yourself  0 0 0 0 0  Trouble concentrating 0 0 0 0 0  Moving slowly or fidgety/restless 0 0 0 0 0  Suicidal thoughts 0 0 0 0 0  PHQ-9 Score 2 2 0 2 2  Difficult doing work/chores Not difficult at all Not difficult at all Not difficult at all Not difficult at all Not difficult at all  Some recent data might be hidden     Fall Risk: Fall Risk  11/23/2018 07/28/2018 04/21/2018 01/19/2018 10/21/2017  Falls in the past year? 0 No No No No  Number falls in past yr: - - - - -  Injury with Fall? - - - - -  Risk for fall due to : - Impaired vision;Impaired balance/gait - - -  Risk for fall due to: Comment - wears eyeglasses; joint pain and back pain - - -     Functional Status Survey: Is the patient deaf or have difficulty hearing?: No Does the patient have difficulty seeing, even when wearing glasses/contacts?: No Does the patient have difficulty concentrating, remembering, or making decisions?: No Does the patient have difficulty walking or climbing stairs?: Yes Does the patient have difficulty dressing or bathing?: No Does the patient have difficulty doing errands alone such as visiting a doctor's office or shopping?: No    Assessment & Plan  1. Irritable bowel syndrome  with both constipation and diarrhea  - amitriptyline (ELAVIL) 10 MG tablet; Take 1 tablet (10 mg total) by mouth at bedtime.  Dispense: 30 tablet; Refill: 5  2. Hypertension, benign  - losartan-hydrochlorothiazide (HYZAAR) 100-25 MG tablet; Take 1 tablet by mouth daily.  Dispense: 30 tablet; Refill: 5  3. Insulin resistance  - metFORMIN (GLUCOPHAGE) 500 MG tablet; Take 1 tablet (500 mg total) by mouth daily with breakfast.  Dispense: 30 tablet; Refill: 5  4. Metabolic syndrome  - metFORMIN (GLUCOPHAGE) 500 MG tablet; Take 1 tablet (500 mg total) by mouth daily with breakfast.  Dispense: 30 tablet; Refill: 5  5. Chronic insomnia  - QUEtiapine (SEROQUEL) 25 MG tablet; Take 1 tablet (25 mg total) by mouth at bedtime.  Dispense: 30 tablet; Refill: 5  6. Major depression, recurrent, chronic (HCC)  - QUEtiapine (SEROQUEL) 25 MG tablet; Take 1 tablet (25 mg total) by mouth at bedtime.  Dispense: 30 tablet; Refill: 5 - PRISTIQ 50 MG 24 hr tablet; Take 1 tablet (50 mg total) by mouth daily.  Dispense: 30 tablet; Refill: 5  7. Simple chronic bronchitis (H[facility])  Trying to quit smoking  8. Abdominal aortic atherosclerosis ([place])  On statin therapy   9. Primary osteoarthritis of both knees  Keep follow up with ortho   10. Dyslipidemia  - Lipid panel  11. Hyperglycemia  - Hemoglobin A1c  12. Routine screening for STI (sexually transmitted infection)  She asked to be tested  - HIV Antibody (routine testing w rflx) - [facility] - GC Probe amplification, urine - Hepatitis panel, acute  13. Encounter for screening for HIV  - HIV Antibody (routine testing w rflx)

## 2018-11-24 LAB — HEPATITIS PANEL, ACUTE
Hep A IgM: NONREACTIVE
Hep B C IgM: NONREACTIVE
Hepatitis B Surface Ag: NONREACTIVE
Hepatitis C Ab: NONREACTIVE
SIGNAL TO CUT-OFF: 0.02 (ref <1.00)

## 2018-11-24 LAB — LIPID PANEL
Cholesterol: 121 mg/dL (ref <200)
HDL: 65 mg/dL (ref >50)
LDL CHOLESTEROL (CALC): 38 mg/dL
Non-HDL Cholesterol (Calc): 56 mg/dL (calc) (ref <130)
Total CHOL/HDL Ratio: 1.9 (calc) (ref <5.0)
Triglycerides: 96 mg/dL (ref <150)

## 2018-11-24 LAB — COMPLETE METABOLIC PANEL WITH GFR
AG RATIO: 1.8 (calc) (ref 1.0–2.5)
ALBUMIN MSPROF: 4.2 g/dL (ref 3.6–5.1)
ALT: 21 U/L (ref 6–29)
AST: 13 U/L (ref 10–35)
Alkaline phosphatase (APISO): 87 U/L (ref 33–130)
BILIRUBIN TOTAL: 0.4 mg/dL (ref 0.2–1.2)
BUN: 16 mg/dL (ref 7–25)
CALCIUM: 9.4 mg/dL (ref 8.6–10.4)
CHLORIDE: 107 mmol/L (ref 98–110)
CO2: 27 mmol/L (ref 20–32)
Creat: 0.71 mg/dL (ref 0.50–1.05)
GFR, EST AFRICAN AMERICAN: 111 mL/min/{1.73_m2} (ref >=60)
GFR, Est Non African American: 96 mL/min/{1.73_m2} (ref >=60)
GLUCOSE: 87 mg/dL (ref 65–99)
Globulin: 2.4 g/dL (calc) (ref 1.9–3.7)
Potassium: 3.8 mmol/L (ref 3.5–5.3)
Sodium: 142 mmol/L (ref 135–146)
TOTAL PROTEIN: 6.6 g/dL (ref 6.1–8.1)

## 2018-11-24 LAB — HEMOGLOBIN A1C
EAG (MMOL/L): 6.6 (calc)
Hgb A1c MFr Bld: 5.8 % of total Hgb — ABNORMAL HIGH (ref <5.7)
Mean Plasma Glucose: 120 (calc)

## 2018-11-24 LAB — HIV ANTIBODY (ROUTINE TESTING W REFLEX): HIV 1&2 Ab, 4th Generation: NONREACTIVE

## 2018-11-24 LAB — RPR: RPR Ser Ql: NONREACTIVE

## 2018-11-24 LAB — URINE CYTOLOGY ANCILLARY ONLY
Chlamydia: NEGATIVE
Neisseria Gonorrhea: NEGATIVE

## 2018-12-20 DIAGNOSIS — M1712 Unilateral primary osteoarthritis, left knee: Secondary | ICD-10-CM | POA: Diagnosis not present

## 2018-12-22 ENCOUNTER — Ambulatory Visit: Payer: Self-pay | Admitting: Orthopedic Surgery

## 2018-12-23 ENCOUNTER — Other Ambulatory Visit: Payer: Self-pay | Admitting: Family Medicine

## 2018-12-23 NOTE — Telephone Encounter (Signed)
Refill request for Hypertension medication:  HCTZ 25 mg  Last office visit pertaining to hypertension: 11/23/2018   This medication was not on her medication list. Please advise.  BP Readings from Last 3 Encounters:  11/23/18 136/84  09/23/18 (!) 202/102  08/15/18 (!) 162/80    Lab Results  Component Value Date   CREATININE 0.71 11/23/2018   BUN 16 11/23/2018   NA 142 11/23/2018   K 3.8 11/23/2018   CL 107 11/23/2018   CO2 27 11/23/2018   Follow-ups on file. 05/24/2019

## 2018-12-25 NOTE — Telephone Encounter (Signed)
She was on losartan, do we need to split? HCTZ and losartan?

## 2018-12-27 NOTE — Telephone Encounter (Signed)
Called patient to get clarity on what she needed and what blood pressure medication was she taking. NO answer LVM.

## 2019-01-02 ENCOUNTER — Other Ambulatory Visit: Payer: Self-pay

## 2019-01-02 DIAGNOSIS — M25462 Effusion, left knee: Secondary | ICD-10-CM | POA: Diagnosis not present

## 2019-01-02 DIAGNOSIS — Z716 Tobacco abuse counseling: Secondary | ICD-10-CM

## 2019-01-02 DIAGNOSIS — M23322 Other meniscus derangements, posterior horn of medial meniscus, left knee: Secondary | ICD-10-CM | POA: Diagnosis not present

## 2019-01-02 DIAGNOSIS — M2242 Chondromalacia patellae, left knee: Secondary | ICD-10-CM | POA: Diagnosis not present

## 2019-01-02 DIAGNOSIS — M1712 Unilateral primary osteoarthritis, left knee: Secondary | ICD-10-CM | POA: Diagnosis not present

## 2019-01-02 NOTE — Telephone Encounter (Signed)
Refill request for general medication Chantix to [facility]   Last office visit 11/23/2018   Follow up on 05/24/2019    

## 2019-01-03 MED ORDER — VARENICLINE TARTRATE 1 MG PO TABS: 1.0000 mg | ORAL_TABLET | Freq: Two times a day (BID) | ORAL | 1 refills | Status: DC

## 2019-01-03 NOTE — Telephone Encounter (Signed)
Can you please call pharmacy?

## 2019-03-01 ENCOUNTER — Inpatient Hospital Stay: Admission: RE | Admit: 2019-03-01 | Payer: Medicare Other | Source: Ambulatory Visit

## 2019-03-07 ENCOUNTER — Other Ambulatory Visit: Payer: Self-pay | Admitting: Family Medicine

## 2019-03-07 DIAGNOSIS — Z716 Tobacco abuse counseling: Secondary | ICD-10-CM

## 2019-03-08 ENCOUNTER — Inpatient Hospital Stay: Admit: 2019-03-08 | Payer: Medicare Other | Admitting: Orthopedic Surgery

## 2019-03-08 SURGERY — ARTHROPLASTY, KNEE, TOTAL
Anesthesia: Choice | Laterality: Left

## 2019-04-03 ENCOUNTER — Ambulatory Visit: Payer: Self-pay | Admitting: Orthopedic Surgery

## 2019-04-06 ENCOUNTER — Other Ambulatory Visit: Payer: Self-pay

## 2019-04-06 ENCOUNTER — Encounter: Payer: Self-pay | Admitting: Family Medicine

## 2019-04-06 ENCOUNTER — Ambulatory Visit (INDEPENDENT_AMBULATORY_CARE_PROVIDER_SITE_OTHER): Payer: Medicare Other | Admitting: Family Medicine

## 2019-04-06 VITALS — BP 160/84 | HR 100 | Temp 97.7°F | Resp 16 | Ht 69.0 in | Wt 202.5 lb

## 2019-04-06 DIAGNOSIS — J449 Chronic obstructive pulmonary disease, unspecified: Secondary | ICD-10-CM

## 2019-04-06 DIAGNOSIS — E663 Overweight: Secondary | ICD-10-CM | POA: Diagnosis not present

## 2019-04-06 DIAGNOSIS — E785 Hyperlipidemia, unspecified: Secondary | ICD-10-CM

## 2019-04-06 DIAGNOSIS — Z716 Tobacco abuse counseling: Secondary | ICD-10-CM | POA: Diagnosis not present

## 2019-04-06 DIAGNOSIS — I10 Essential (primary) hypertension: Secondary | ICD-10-CM

## 2019-04-06 DIAGNOSIS — E8881 Metabolic syndrome: Secondary | ICD-10-CM | POA: Diagnosis not present

## 2019-04-06 DIAGNOSIS — Z01818 Encounter for other preprocedural examination: Secondary | ICD-10-CM

## 2019-04-06 MED ORDER — AMLODIPINE BESYLATE 5 MG PO TABS: 5.0000 mg | ORAL_TABLET | Freq: Every day | ORAL | 3 refills | Status: DC

## 2019-04-06 NOTE — Progress Notes (Signed)
Name: [name]   MRN: [id number]    DOB: 20-Mar-1961   Date:04/06/2019       Progress Note  Subjective  Chief Complaint  Chief Complaint  Patient presents with  . Procedure    surgical clearance for left knee replacement    HPI  PT presents for pre-op clearance for TKR of the LEFT knee on 04/12/2019 with Dr. Harlow Mares.  Cardiac:  She has HLD and HTN - BP has been slightly elevated today and with preop appt earlier today.  States her pain has been very high - at 10/10 today. She is taking losartan-HCTZ 100-25mg , no other BP medications.  She used to be on amlodipine in the past and did well on it, but came off because her BP's were so well controlled. We will restart this today and follow up in 4-5 days to recheck.  - Insulin Reistance: Last A1C 5.8%  - Pain Control: Taking Oxycodone and Meloxicam. - Tobacco Abuse: She is on Chantix - she is down to about 3 cigarettes a day at the most for over a month.  Advised must completely stop smoking today as she cannot be cleared for surgery without quitting smoking. Hx mild COPD - no issues in years.  Patient Active Problem List   Diagnosis Date Noted  . Peptic ulcer   . Encounter for screening colonoscopy   . Benign neoplasm of ascending colon   . Diverticulosis of large intestine without diverticulitis   . Simple chronic bronchitis ([place]) 08/03/2018  . Pancreatic lesion 04/21/2018  . Abdominal aortic atherosclerosis ([place]) 04/21/2018  . Reflux esophagitis   . Stomach irritation   . Acute peptic ulcer of stomach   . Gastric polyp   . Abdominal pain, epigastric   . Trigger thumb of right hand 02/25/2018  . Carpal tunnel syndrome on both sides 10/21/2017  . CRP elevated 08/09/2017  . Elevated C-reactive protein 07/28/2017  . Osteoarthritis of both knees 02/19/2016  . COPD, mild ([place]) 05/08/2015  . Vitamin D deficiency 05/07/2015  . Metabolic syndrome 94/70/9628  . Eczema 05/07/2015  . Chronic insomnia 05/07/2015  . Hypertension,  benign 05/07/2015  . Dyslipidemia 05/07/2015  . Hyperglycemia 05/07/2015  . Chronic low back pain 05/07/2015  . IBS (irritable bowel syndrome) 05/07/2015  . Gastroesophageal reflux disease without esophagitis 05/07/2015  . Major depression, chronic ([place]) 05/07/2015  . Menopausal syndrome (hot flashes) 05/07/2015  . History of postmenopausal bleeding 01/14/2015    Past Surgical History:  Procedure Laterality Date  . BACK SURGERY    . BREAST BIOPSY Left    neg  . BREAST LUMPECTOMY Left   . CARPAL TUNNEL RELEASE Left 08/15/2018   Procedure: CARPAL TUNNEL RELEASE;  Surgeon: Earnestine Leys, MD;  Location: [facility] ORS;  Service: Orthopedics;  Laterality: Left;  . CERVICAL DISCECTOMY     x 2; metal plate  . CHOLECYSTECTOMY    . COLONOSCOPY WITH PROPOFOL N/A 08/08/2018   Procedure: COLONOSCOPY WITH PROPOFOL;  Surgeon: Virgel Manifold, MD;  Location: [facility] END[facility];  Service: Endoscopy;  Laterality: N/A;  . DILATATION & CURETTAGE/HYSTEROSCOPY WITH MYOSURE N/A 03/21/2015   Procedure: DILATATION & CURETTAGE/HYSTEROSCOPY;  Surgeon: Aletha Halim, MD;  Location: [facility] ORS;  Service: Gynecology;  Laterality: N/A;  . DILATION AND CURETTAGE OF UTERUS    . ENDOMETRIAL ABLATION    . ESOPHAGOGASTRODUODENOSCOPY (EGD) WITH PROPOFOL N/A 04/13/2018   Procedure: ESOPHAGOGASTRODUODENOSCOPY (EGD) WITH PROPOFOL;  Surgeon: Virgel Manifold, MD;  Location: [facility];  Service: Endoscopy;  Laterality: N/A;  .  ESOPHAGOGASTRODUODENOSCOPY (EGD) WITH PROPOFOL N/A 08/08/2018   Procedure: ESOPHAGOGASTRODUODENOSCOPY (EGD) WITH PROPOFOL;  Surgeon: Virgel Manifold, MD;  Location: [facility];  Service: Endoscopy;  Laterality: N/A;  . SPINAL FUSION     lumbar x2  . TONSILLECTOMY    . TUBAL LIGATION      Family History  Problem Relation Age of Onset  . Heart disease Mother   . Thyroid cancer Mother   . Depression Daughter   . Asthma Daughter     Social History   Socioeconomic History  .  Marital status: Legally Separated    Spouse name: Not on file  . Number of children: 4  . Years of education: Not on file  . Highest education level: 12th grade  Occupational History  . Occupation: disabled    Comment: chronic back pain   Social Needs  . Financial resource strain: Not hard at all  . Food insecurity:    Worry: Never true    Inability: Never true  . Transportation needs:    Medical: No    Non-medical: No  Tobacco Use  . Smoking status: Current Every Day Smoker    Packs/day: 0.25    Years: 20.00    Pack years: 5.00    Types: Cigarettes    Start date: 07/26/2002  . Smokeless tobacco: Never Used  Substance and Sexual Activity  . Alcohol use: No    Alcohol/week: 0.0 standard drinks  . Drug use: No  . Sexual activity: Yes    Partners: Male    Birth control/protection: Other-see comments    Comment: Ablation  Lifestyle  . Physical activity:    Days per week: 0 days    Minutes per session: 0 min  . Stress: Not at all  Relationships  . Social connections:    Talks on phone: Patient refused    Gets together: Patient refused    Attends religious service: Patient refused    Active member of club or organization: Patient refused    Attends meetings of clubs or organizations: Patient refused    Relationship status: Separated  . Intimate partner violence:    Fear of current or ex partner: No    Emotionally abused: No    Physically abused: No    Forced sexual activity: No  Other Topics Concern  . Not on file  Social History Narrative  . Not on file     Current Outpatient Medications:  .  acetaminophen (TYLENOL) 500 MG tablet, Take 1 tablet (500 mg total) by mouth every 6 (six) hours as needed., Disp: 30 tablet, Rfl: 2 .  amitriptyline (ELAVIL) 10 MG tablet, Take 1 tablet (10 mg total) by mouth at bedtime., Disp: 30 tablet, Rfl: 5 .  atorvastatin (LIPITOR) 40 MG tablet, Take 1 tablet (40 mg total) by mouth daily., Disp: 90 tablet, Rfl: 3 .  CHANTIX  CONTINUING MONTH PAK 1 MG tablet, TAKE ONE TABLET BY MOUTH TWICE DAILY, Disp: 56 tablet, Rfl: 1 .  cholecalciferol (VITAMIN D) 1000 units tablet, Take 1,000 Units by mouth daily., Disp: , Rfl:  .  gabapentin (NEURONTIN) 300 MG capsule, Take 1 capsule by mouth 2 (two) times daily. , Disp: , Rfl:  .  lidocaine (LIDODERM) 5 %, Place 1 patch onto the skin daily. Remove & Discard patch within 12 hours or as directed by MD (Patient taking differently: Place 1 patch onto the skin daily as needed (pain). Remove & Discard patch within 12 hours or as directed by MD), Disp: 30  patch, Rfl: 0 .  losartan-hydrochlorothiazide (HYZAAR) 100-25 MG tablet, Take 1 tablet by mouth daily., Disp: 30 tablet, Rfl: 5 .  meloxicam (MOBIC) 15 MG tablet, Take 1 tablet (15 mg total) by mouth daily., Disp: 30 tablet, Rfl: 0 .  metFORMIN (GLUCOPHAGE) 500 MG tablet, Take 1 tablet (500 mg total) by mouth daily with breakfast., Disp: 30 tablet, Rfl: 5 .  omeprazole (PRILOSEC) 20 MG capsule, Take 1 capsule (20 mg total) by mouth daily., Disp: 30 capsule, Rfl: 5 .  oxyCODONE (ROXICODONE) 15 MG immediate release tablet, Take 1 tablet by mouth every 4 (four) hours as needed for pain. , Disp: , Rfl:  .  PRISTIQ 50 MG 24 hr tablet, Take 1 tablet (50 mg total) by mouth daily., Disp: 30 tablet, Rfl: 5 .  QUEtiapine (SEROQUEL) 25 MG tablet, Take 1 tablet (25 mg total) by mouth at bedtime., Disp: 30 tablet, Rfl: 5 .  tiZANidine (ZANAFLEX) 4 MG tablet, Take 1 tablet by mouth at bedtime as needed for muscle spasms. , Disp: , Rfl:   No Known Allergies  I personally reviewed active problem list, medication list, allergies, notes from last encounter, lab results with the patient/caregiver today.   ROS Constitutional: Negative for fever or weight change.  Respiratory: Negative for cough and shortness of breath.   Cardiovascular: Negative for chest pain or palpitations.  Gastrointestinal: Negative for abdominal pain, no bowel changes.   Musculoskeletal: Negative for gait problem or joint swelling.  Skin: Negative for rash.  Neurological: Negative for dizziness or headache.  No other specific complaints in a complete review of systems (except as listed in HPI above).  Objective  Vitals:   04/06/19 1316  BP: (!) 160/84  Pulse: 100  Resp: 16  Temp: 97.7 F (36.5 C)  TempSrc: Oral  SpO2: 98%  Weight: 202 lb 8 oz (91.9 kg)  Height: 5\' 9"  (1.753 m)   Body mass index is 29.9 kg/m.  Physical Exam Constitutional: Patient appears well-developed and well-nourished. No distress.  HENT: Head: Normocephalic and atraumatic. Ears: bilateral TMs with no erythema or effusion; Nose: Nose normal. Mouth/Throat: Oropharynx is clear and moist. No oropharyngeal exudate or tonsillar swelling.  Eyes: Conjunctivae and EOM are normal. No scleral icterus.  Pupils are equal, round, and reactive to light.  Neck: Normal range of motion. Neck supple. No JVD present. No thyromegaly present.  Cardiovascular: Normal rate, regular rhythm and normal heart sounds.  No murmur heard. No BLE edema. Pulmonary/Chest: Effort normal and breath sounds normal. No respiratory distress. Abdominal: Soft. Bowel sounds are normal, no distension. There is no tenderness. No masses. Musculoskeletal: Normal range of motion, no joint effusions. No gross deformities Neurological: Pt is alert and oriented to person, place, and time. No cranial nerve deficit. Coordination, balance, strength, speech and gait are normal.  Skin: Skin is warm and dry. No rash noted. No erythema.  Psychiatric: Patient has a normal mood and affect. behavior is normal. Judgment and thought content normal.  No results found for this or any previous visit (from the past 72 hour(s)).   PHQ2/9: Depression screen [facility] 2/9 04/06/2019 11/23/2018 08/03/2018 07/28/2018 04/21/2018  Decreased Interest 0 0 0 0 0  Down, Depressed, Hopeless 0 1 0 0 1  PHQ - 2 Score 0 1 0 0 1  Altered sleeping 0 1 0 0 1   Tired, decreased energy 0 0 1 0 0  Change in appetite 0 0 1 0 0  Feeling bad or failure about yourself  0  0 0 0 0  Trouble concentrating 0 0 0 0 0  Moving slowly or fidgety/restless 0 0 0 0 0  Suicidal thoughts 0 0 0 0 0  PHQ-9 Score 0 2 2 0 2  Difficult doing work/chores Not difficult at all Not difficult at all Not difficult at all Not difficult at all Not difficult at all  Some recent data might be hidden   PHQ-2/9 Result is negative.    Fall Risk: Fall Risk  04/06/2019 11/23/2018 07/28/2018 04/21/2018 01/19/2018  Falls in the past year? 0 0 No No No  Number falls in past yr: 0 - - - -  Injury with Fall? 0 - - - -  Risk for fall due to : - - Impaired vision;Impaired balance/gait - -  Risk for fall due to: Comment - - wears eyeglasses; joint pain and back pain - -  Follow up Falls evaluation completed - - - -    Assessment & Plan  1. Pre-op evaluation - amLODipine (NORVASC) 5 MG tablet; Take 1 tablet (5 mg total) by mouth daily.  Dispense: 90 tablet; Refill: 3 - CBC - COMPLETE METABOLIC PANEL WITH GFR  2. Hypertension, benign - amLODipine (NORVASC) 5 MG tablet; Take 1 tablet (5 mg total) by mouth daily.  Dispense: 90 tablet; Refill: 3 - Return in 4-5 days for recheck.  3. COPD, mild (HCC) - Stable, doing well  4. Metabolic syndrome - Continue metformin, last A1C was 5.8%, and this has been very stable  5. Dyslipidemia - Taking statin therapy.  6. Overweight (BMI 25.0-29.9) - BMI within range for surgery  7. Tobacco abuse counseling - Paperwork states MUST quit for 4 weeks prior to surgery - she has been down to 3 cigarettes a day for over 4 weeks and will quit today.  Will reach out to Carlynn Spry PA-C with [facility] to see if this is still a required postponement or not.

## 2019-04-07 ENCOUNTER — Other Ambulatory Visit: Payer: Self-pay | Admitting: Family Medicine

## 2019-04-07 DIAGNOSIS — E876 Hypokalemia: Secondary | ICD-10-CM

## 2019-04-07 LAB — COMPLETE METABOLIC PANEL WITH GFR
AG Ratio: 1.9 (calc) (ref 1.0–2.5)
ALT: 21 U/L (ref 6–29)
AST: 20 U/L (ref 10–35)
Albumin: 4.7 g/dL (ref 3.6–5.1)
Alkaline phosphatase (APISO): 107 U/L (ref 37–153)
BUN: 13 mg/dL (ref 7–25)
CO2: 26 mmol/L (ref 20–32)
Calcium: 9.8 mg/dL (ref 8.6–10.4)
Chloride: 105 mmol/L (ref 98–110)
Creat: 0.83 mg/dL (ref 0.50–1.05)
GFR, Est African American: 91 mL/min/{1.73_m2} (ref >=60)
GFR, Est Non African American: 79 mL/min/{1.73_m2} (ref >=60)
Globulin: 2.5 g/dL (calc) (ref 1.9–3.7)
Glucose, Bld: 104 mg/dL — ABNORMAL HIGH (ref 65–99)
Potassium: 3.1 mmol/L — ABNORMAL LOW (ref 3.5–5.3)
Sodium: 142 mmol/L (ref 135–146)
Total Bilirubin: 0.3 mg/dL (ref 0.2–1.2)
Total Protein: 7.2 g/dL (ref 6.1–8.1)

## 2019-04-07 LAB — CBC
HCT: 39.3 % (ref 35.0–45.0)
Hemoglobin: 12.9 g/dL (ref 11.7–15.5)
MCH: 30 pg (ref 27.0–33.0)
MCHC: 32.8 g/dL (ref 32.0–36.0)
MCV: 91.4 fL (ref 80.0–100.0)
MPV: 11.7 fL (ref 7.5–12.5)
Platelets: 321 10*3/uL (ref 140–400)
RBC: 4.3 10*6/uL (ref 3.80–5.10)
RDW: 12.4 % (ref 11.0–15.0)
WBC: 10 10*3/uL (ref 3.8–10.8)

## 2019-04-07 MED ORDER — POTASSIUM CHLORIDE ER 10 MEQ PO TBCR: 10.0000 meq | EXTENDED_RELEASE_TABLET | Freq: Two times a day (BID) | ORAL | 0 refills | Status: DC

## 2019-04-10 ENCOUNTER — Ambulatory Visit: Payer: Medicare Other

## 2019-04-11 ENCOUNTER — Emergency Department
Admission: EM | Admit: 2019-04-11 | Discharge: 2019-04-11 | Disposition: A | Discharge status: Home / Self Care | Payer: Medicare Other | Source: Home / Self Care | Attending: Student in an Organized Health Care Education/Training Program | Admitting: Student in an Organized Health Care Education/Training Program

## 2019-04-11 ENCOUNTER — Encounter
Admission: RE | Admit: 2019-04-11 | Discharge: 2019-04-11 | Disposition: A | Discharge status: Home / Self Care | Payer: Medicare Other | Source: Ambulatory Visit | Attending: Orthopedic Surgery | Admitting: Orthopedic Surgery

## 2019-04-11 ENCOUNTER — Other Ambulatory Visit: Payer: Self-pay

## 2019-04-11 ENCOUNTER — Ambulatory Visit (INDEPENDENT_AMBULATORY_CARE_PROVIDER_SITE_OTHER): Payer: Medicare Other

## 2019-04-11 ENCOUNTER — Telehealth: Payer: Self-pay | Admitting: Nurse Practitioner

## 2019-04-11 ENCOUNTER — Encounter: Payer: Self-pay | Admitting: *Deleted

## 2019-04-11 ENCOUNTER — Telehealth: Payer: Self-pay | Admitting: Family Medicine

## 2019-04-11 VITALS — BP 124/72 | HR 98

## 2019-04-11 DIAGNOSIS — E876 Hypokalemia: Secondary | ICD-10-CM

## 2019-04-11 DIAGNOSIS — Z0130 Encounter for examination of blood pressure without abnormal findings: Secondary | ICD-10-CM

## 2019-04-11 DIAGNOSIS — J449 Chronic obstructive pulmonary disease, unspecified: Secondary | ICD-10-CM | POA: Insufficient documentation

## 2019-04-11 DIAGNOSIS — Z79899 Other long term (current) drug therapy: Secondary | ICD-10-CM | POA: Insufficient documentation

## 2019-04-11 DIAGNOSIS — M25562 Pain in left knee: Secondary | ICD-10-CM | POA: Diagnosis not present

## 2019-04-11 DIAGNOSIS — I10 Essential (primary) hypertension: Secondary | ICD-10-CM | POA: Diagnosis not present

## 2019-04-11 DIAGNOSIS — Z1159 Encounter for screening for other viral diseases: Secondary | ICD-10-CM | POA: Diagnosis not present

## 2019-04-11 DIAGNOSIS — E8881 Metabolic syndrome: Secondary | ICD-10-CM | POA: Diagnosis not present

## 2019-04-11 DIAGNOSIS — Z87891 Personal history of nicotine dependence: Secondary | ICD-10-CM | POA: Insufficient documentation

## 2019-04-11 DIAGNOSIS — E785 Hyperlipidemia, unspecified: Secondary | ICD-10-CM | POA: Diagnosis not present

## 2019-04-11 DIAGNOSIS — M170 Bilateral primary osteoarthritis of knee: Secondary | ICD-10-CM | POA: Diagnosis not present

## 2019-04-11 LAB — BASIC METABOLIC PANEL
Anion gap: 13 (ref 5–15)
Anion gap: 14 (ref 5–15)
BUN: 16 mg/dL (ref 6–20)
BUN: 17 mg/dL (ref 6–20)
CO2: 25 mmol/L (ref 22–32)
CO2: 26 mmol/L (ref 22–32)
Calcium: 9.2 mg/dL (ref 8.9–10.3)
Calcium: 9.5 mg/dL (ref 8.9–10.3)
Chloride: 100 mmol/L (ref 98–111)
Chloride: 100 mmol/L (ref 98–111)
Creatinine, Ser: 1.06 mg/dL — ABNORMAL HIGH (ref 0.44–1.00)
Creatinine, Ser: 0.98 mg/dL (ref 0.44–1.00)
GFR calc Af Amer: >60 mL/min (ref >60)
GFR calc Af Amer: >60 mL/min (ref >60)
GFR calc non Af Amer: 59 mL/min — ABNORMAL LOW (ref >60)
GFR calc non Af Amer: >60 mL/min (ref >60)
Glucose, Bld: 111 mg/dL — ABNORMAL HIGH (ref 70–99)
Glucose, Bld: 140 mg/dL — ABNORMAL HIGH (ref 70–99)
Potassium: 2.6 mmol/L — CL (ref 3.5–5.1)
Potassium: 2.8 mmol/L — ABNORMAL LOW (ref 3.5–5.1)
Sodium: 138 mmol/L (ref 135–145)
Sodium: 140 mmol/L (ref 135–145)

## 2019-04-11 LAB — MAGNESIUM: Magnesium: 1.5 mg/dL — ABNORMAL LOW (ref 1.7–2.4)

## 2019-04-11 LAB — CBC
HCT: 36.3 % (ref 36.0–46.0)
Hemoglobin: 12.1 g/dL (ref 12.0–15.0)
MCH: 30.5 pg (ref 26.0–34.0)
MCHC: 33.3 g/dL (ref 30.0–36.0)
MCV: 91.4 fL (ref 80.0–100.0)
Platelets: 330 10*3/uL (ref 150–400)
RBC: 3.97 MIL/uL (ref 3.87–5.11)
RDW: 12.4 % (ref 11.5–15.5)
WBC: 8.6 10*3/uL (ref 4.0–10.5)
nRBC: 0 % (ref 0.0–0.2)

## 2019-04-11 LAB — URINALYSIS, ROUTINE W REFLEX MICROSCOPIC
Bacteria, UA: NONE SEEN
Bilirubin Urine: NEGATIVE
Glucose, UA: NEGATIVE mg/dL
Hgb urine dipstick: NEGATIVE
Ketones, ur: NEGATIVE mg/dL
Leukocytes,Ua: NEGATIVE
Nitrite: NEGATIVE
Protein, ur: 30 mg/dL — AB
Specific Gravity, Urine: 1.013 (ref 1.005–1.030)
pH: 5 (ref 5.0–8.0)

## 2019-04-11 LAB — SURGICAL PCR SCREEN
MRSA, PCR: NEGATIVE
Staphylococcus aureus: NEGATIVE

## 2019-04-11 LAB — TYPE AND SCREEN
ABO/RH(D): A POS
Antibody Screen: NEGATIVE

## 2019-04-11 LAB — SARS CORONAVIRUS 2 BY RT PCR (HOSPITAL ORDER, PERFORMED IN ~~LOC~~ HOSPITAL LAB): SARS Coronavirus 2: NEGATIVE

## 2019-04-11 LAB — PROTIME-INR
INR: 1.1 (ref 0.8–1.2)
Prothrombin Time: 13.6 seconds (ref 11.4–15.2)

## 2019-04-11 LAB — APTT: aPTT: 28 seconds (ref 24–36)

## 2019-04-11 MED ORDER — POTASSIUM CHLORIDE CRYS ER 20 MEQ PO TBCR
40.0000 meq | EXTENDED_RELEASE_TABLET | Freq: Once | ORAL | Status: AC
  Administered 2019-04-11: 40 meq via ORAL
  Filled 2019-04-11: qty 2

## 2019-04-11 MED ORDER — MAGNESIUM OXIDE 400 (241.3 MG) MG PO TABS
400.0000 mg | ORAL_TABLET | Freq: Every day | ORAL | Status: DC
  Administered 2019-04-11: 400 mg via ORAL
  Filled 2019-04-11: qty 1

## 2019-04-11 MED ORDER — MAGNESIUM OXIDE 250 MG PO TABS: 1.0000 | ORAL_TABLET | Freq: Every day | ORAL | 0 refills | Status: AC

## 2019-04-11 MED ORDER — POTASSIUM CHLORIDE 10 MEQ/100ML IV SOLN
10.0000 meq | INTRAVENOUS | Status: AC
  Administered 2019-04-11: 10 meq via INTRAVENOUS
  Filled 2019-04-11 (×2): qty 100

## 2019-04-11 MED ORDER — MAGNESIUM SULFATE 2 GM/50ML IV SOLN
2.0000 g | Freq: Once | INTRAVENOUS | Status: AC
  Administered 2019-04-11: 20:00:00 2 g via INTRAVENOUS
  Filled 2019-04-11: qty 50

## 2019-04-11 MED ORDER — POTASSIUM CHLORIDE 10 MEQ/100ML IV SOLN
10.0000 meq | INTRAVENOUS | Status: DC
  Filled 2019-04-11 (×3): qty 100

## 2019-04-11 MED ORDER — SODIUM CHLORIDE 0.9 % IV SOLN
Freq: Once | INTRAVENOUS | Status: AC
  Administered 2019-04-11: 22:00:00 via INTRAVENOUS

## 2019-04-11 NOTE — ED Provider Notes (Signed)
[facility] Emergency Department Provider Note    First MD Initiated Contact with Patient 04/11/19 1901     (approximate)  I have reviewed the triage vital signs and the nursing notes.   HISTORY  Chief Complaint hypokalemia    HPI [name] is a 58 y.o. female presents the ER from preop evaluation due to concern for low potassium.  States that she feels well.  He is otherwise asymptomatic.  Had borderline low potassium earlier this week was given supplementation had repeat test done in clinic today that showed declining potassium.  She is scheduled for left knee replacement tomorrow.  No new medication changes.  Not INS medications.    Past Medical History:  Diagnosis Date  . Arthritis    bilateral knees  . Contact dermatitis and other eczema, due to unspecified cause   . COPD (chronic obstructive pulmonary disease) ([place])   . Depressive disorder   . Dysmetabolic syndrome X   . GERD (gastroesophageal reflux disease)   . Hyperlipidemia   . Hypertension   . IBS (irritable bowel syndrome)   . Insomnia   . Lumbago   . Nonspecific abnormal electrocardiogram (ECG) (EKG)   . Other ovarian failure(256.39)   . Postmenopausal atrophic vaginitis   . Symptomatic menopausal or female climacteric states   . Unspecified vitamin D deficiency    Family History  Problem Relation Age of Onset  . Heart disease Mother   . Thyroid cancer Mother   . Depression Daughter   . Asthma Daughter    Past Surgical History:  Procedure Laterality Date  . BACK SURGERY    . BREAST BIOPSY Left    neg  . BREAST LUMPECTOMY Left   . CARPAL TUNNEL RELEASE Left 08/15/2018   Procedure: CARPAL TUNNEL RELEASE;  Surgeon: Earnestine Leys, MD;  Location: [facility] OR[facility];  Service: Orthopedics;  Laterality: Left;  . CERVICAL DISCECTOMY     x 2; metal plate  . CHOLECYSTECTOMY    . COLONOSCOPY WITH PROPOFOL N/A 08/08/2018   Procedure: COLONOSCOPY WITH PROPOFOL;  Surgeon: Virgel Manifold, MD;  Location: [facility];  Service: Endoscopy;  Laterality: N/A;  . DILATATION & CURETTAGE/HYSTEROSCOPY WITH MYOSURE N/A 03/21/2015   Procedure: DILATATION & CURETTAGE/HYSTEROSCOPY;  Surgeon: Aletha Halim, MD;  Location: [facility] ORS;  Service: Gynecology;  Laterality: N/A;  . DILATION AND CURETTAGE OF UTERUS    . ENDOMETRIAL ABLATION    . ESOPHAGOGASTRODUODENOSCOPY (EGD) WITH PROPOFOL N/A 04/13/2018   Procedure: ESOPHAGOGASTRODUODENOSCOPY (EGD) WITH PROPOFOL;  Surgeon: Virgel Manifold, MD;  Location: [facility];  Service: Endoscopy;  Laterality: N/A;  . ESOPHAGOGASTRODUODENOSCOPY (EGD) WITH PROPOFOL N/A 08/08/2018   Procedure: ESOPHAGOGASTRODUODENOSCOPY (EGD) WITH PROPOFOL;  Surgeon: Virgel Manifold, MD;  Location: [facility] END[facility];  Service: Endoscopy;  Laterality: N/A;  . SPINAL FUSION     lumbar x2  . TONSILLECTOMY    . TUBAL LIGATION     Patient Active Problem List   Diagnosis Date Noted  . Overweight (BMI 25.0-29.9) 04/06/2019  . Peptic ulcer   . Encounter for screening colonoscopy   . Benign neoplasm of ascending colon   . Diverticulosis of large intestine without diverticulitis   . Simple chronic bronchitis ([place]) 08/03/2018  . Pancreatic lesion 04/21/2018  . Abdominal aortic atherosclerosis ([place]) 04/21/2018  . Reflux esophagitis   . Stomach irritation   . Acute peptic ulcer of stomach   . Gastric polyp   . Abdominal pain, epigastric   . Trigger thumb of right hand  02/25/2018  . Carpal tunnel syndrome on both sides 10/21/2017  . CRP elevated 08/09/2017  . Elevated C-reactive protein 07/28/2017  . Osteoarthritis of both knees 02/19/2016  . COPD, mild ([place]) 05/08/2015  . Vitamin D deficiency 05/07/2015  . Metabolic syndrome 73/53/2992  . Eczema 05/07/2015  . Chronic insomnia 05/07/2015  . Hypertension, benign 05/07/2015  . Dyslipidemia 05/07/2015  . Hyperglycemia 05/07/2015  . Chronic low back pain 05/07/2015  . IBS (irritable bowel syndrome)  05/07/2015  . Gastroesophageal reflux disease without esophagitis 05/07/2015  . Major depression, chronic ([place]) 05/07/2015  . Menopausal syndrome (hot flashes) 05/07/2015  . History of postmenopausal bleeding 01/14/2015      Prior to Admission medications   Medication Sig Start Date End Date Taking? Authorizing Provider  acetaminophen (TYLENOL) 500 MG tablet Take 1 tablet (500 mg total) by mouth every 6 (six) hours as needed. Patient taking differently: Take 1,000 mg by mouth every 6 (six) hours as needed for mild pain or headache.  02/19/16   Steele Sizer, MD  amitriptyline (ELAVIL) 10 MG tablet Take 1 tablet (10 mg total) by mouth at bedtime. Patient taking differently: Take 10 mg by mouth at bedtime as needed for sleep.  11/23/18   Steele Sizer, MD  amLODipine (NORVASC) 5 MG tablet Take 1 tablet (5 mg total) by mouth daily. 04/06/19   Hubbard Hartshorn, FNP  atorvastatin (LIPITOR) 40 MG tablet Take 1 tablet (40 mg total) by mouth daily. 04/21/18   Steele Sizer, MD  CHANTIX CONTINUING MONTH PAK 1 MG tablet TAKE ONE TABLET BY MOUTH TWICE DAILY Patient taking differently: Take 1 mg by mouth 2 (two) times daily.  03/07/19   Steele Sizer, MD  cholecalciferol (VITAMIN D) 1000 units tablet Take 1,000 Units by mouth daily.    [provider]  gabapentin (NEURONTIN) 300 MG capsule Take 300 mg by mouth 2 (two) times daily.  05/07/15   [provider]  lidocaine (LIDODERM) 5 % Place 1 patch onto the skin daily. Remove & Discard patch within 12 hours or as directed by MD Patient taking differently: Place 1 patch onto the skin daily as needed (pain). Remove & Discard patch within 12 hours or as directed by MD 10/21/17   Steele Sizer, MD  losartan-hydrochlorothiazide (HYZAAR) 100-25 MG tablet Take 1 tablet by mouth daily. 11/23/18   Steele Sizer, MD  Magnesium Oxide 250 MG TABS Take 1 tablet (250 mg total) by mouth daily for 7 days. 04/11/19 04/18/19  Merlyn Lot, MD   meloxicam (MOBIC) 15 MG tablet Take 1 tablet (15 mg total) by mouth daily. 09/23/18   Cuthriell, Charline Bills, PA-C  metFORMIN (GLUCOPHAGE) 500 MG tablet Take 1 tablet (500 mg total) by mouth daily with breakfast. 11/23/18   Steele Sizer, MD  omeprazole (PRILOSEC) 20 MG capsule Take 1 capsule (20 mg total) by mouth daily. Patient not taking: Reported on 04/07/2019 11/23/18   Steele Sizer, MD  Oxycodone HCl 20 MG TABS Take 20 mg by mouth every 4 (four) hours as needed (severe pain).    [provider]  potassium chloride (K-DUR) 10 MEQ tablet Take 1 tablet (10 mEq total) by mouth 2 (two) times daily for 3 days. 04/07/19 04/10/19  Hubbard Hartshorn, FNP  PRISTIQ 50 MG 24 hr tablet Take 1 tablet (50 mg total) by mouth daily. 11/23/18   Steele Sizer, MD  QUEtiapine (SEROQUEL) 25 MG tablet Take 1 tablet (25 mg total) by mouth at bedtime. 11/23/18   Steele Sizer, MD  tiZANidine (ZANAFLEX) 4 MG tablet Take 4 mg by mouth every 12 (twelve) hours.  05/07/15   [provider]    Allergies Patient has no known allergies.    Social History Social History   Tobacco Use  . Smoking status: Former Smoker    Packs/day: 0.25    Years: 20.00    Pack years: 5.00    Types: Cigarettes    Start date: 07/26/2002    Last attempt to quit: 04/06/2019    Years since quitting: 0.0  . Smokeless tobacco: Never Used  . Tobacco comment: Quit Date 04/06/2019  Substance Use Topics  . Alcohol use: No    Alcohol/week: 0.0 standard drinks  . Drug use: No    Review of Systems Patient denies headaches, rhinorrhea, blurry vision, numbness, shortness of breath, chest pain, edema, cough, abdominal pain, nausea, vomiting, diarrhea, dysuria, fevers, rashes or hallucinations unless otherwise stated above in HPI. ____________________________________________   PHYSICAL EXAM:  VITAL SIGNS: Vitals:   04/11/19 1648 04/11/19 1942  BP: 123/68 (!) 163/82  Pulse: 90 82  Resp: 16 18  Temp: 98 F (36.7 C)   SpO2:  95% 99%    Constitutional: Alert and oriented.  Eyes: Conjunctivae are normal.  Head: Atraumatic. Nose: No congestion/rhinnorhea. Mouth/Throat: Mucous membranes are moist.   Neck: No stridor. Painless ROM.  Cardiovascular: Normal rate, regular rhythm. Grossly normal heart sounds.  Good peripheral circulation. Respiratory: Normal respiratory effort.  No retractions. Lungs CTAB. Gastrointestinal: Soft and nontender. No distention. No abdominal bruits. No CVA tenderness. Genitourinary:  Musculoskeletal: No lower extremity tenderness nor edema.  No joint effusions. Neurologic:  Normal speech and language. No gross focal neurologic deficits are appreciated. No facial droop Skin:  Skin is warm, dry and intact. No rash noted. Psychiatric: Mood and affect are normal. Speech and behavior are normal.  ____________________________________________   LABS (all labs ordered are listed, but only abnormal results are displayed)  Results for orders placed or performed during the hospital encounter of 04/11/19 (from the past 24 hour(s))  Basic metabolic panel     Status: Abnormal   Collection Time: 04/11/19  7:29 PM  Result Value Ref Range   Sodium 140 135 - 145 mmol/L   Potassium 2.8 (L) 3.5 - 5.1 mmol/L   Chloride 100 98 - 111 mmol/L   CO2 26 22 - 32 mmol/L   Glucose, Bld 140 (H) 70 - 99 mg/dL   BUN 17 6 - 20 mg/dL   Creatinine, Ser 0.98 0.44 - 1.00 mg/dL   Calcium 9.5 8.9 - 10.3 mg/dL   GFR calc non Af Amer >60 >60 mL/min   GFR calc Af Amer >60 >60 mL/min   Anion gap 14 5 - 15  Magnesium     Status: Abnormal   Collection Time: 04/11/19  7:29 PM  Result Value Ref Range   Magnesium 1.5 (L) 1.7 - 2.4 mg/dL   ____________________________________________  EKG My review and personal interpretation at Time: 20:28   Indication: low k  Rate: 75  Rhythm: sinus Axis: normal Other: normal intervals, nonspecific st abn ____________________________________________  RADIOLOGY  I personally  reviewed all radiographic images ordered to evaluate for the above acute complaints and reviewed radiology reports and findings.  These findings were personally discussed with the patient.  Please see medical record for radiology report.  ____________________________________________   PROCEDURES  Procedure(s) performed:  Procedures    Critical Care performed: no ____________________________________________   INITIAL IMPRESSION / ASSESSMENT AND PLAN / ED COURSE  Pertinent labs & imaging results that were available during my care of the patient were reviewed by me and considered in my medical decision making (see chart for details).   DDX: Slight abnormality, medication effect, dehydration  UMI MAINOR is a 58 y.o. who presents to the ED with asymptomatic lab abnormality.  Potassium was critically low to two-point 6 repeat he was 2.8.  Magnesium checked and also low.  Patient completely asymptomatic.  Uncertain etiology magnesium potassium losses.  Possible medication effect.  No report of any diarrheal losses.  Will replete electrolytes.  Clinical Course as of Apr 10 2046  Tue Apr 11, 2019  2020 She does have low magnesium likely contributing to her low potassium.  Uncertain of etiology but patient is asymptomatic and well-appearing.  We will give 2 rounds of IV KCl as been given K. Dur as well as IV magnesium to try and optimize her prior to surgery.  She is asymptomatic I do not feel she requires hospitalization.   [PR]    Clinical Course User Index [PR] Merlyn Lot, MD    The patient was evaluated in Emergency Department today for the symptoms described in the history of present illness. He/she was evaluated in the context of the global COVID-19 pandemic, which necessitated consideration that the patient might be at risk for infection with the SARS-CoV-2 virus that causes COVID-19. Institutional protocols and algorithms that pertain to the evaluation of patients at risk  for COVID-19 are in a state of rapid change based on information released by regulatory bodies including the CDC and federal and state organizations. These policies and algorithms were followed during the patient's care in the ED.  As part of my medical decision making, I reviewed the following data within the [facility] notes reviewed and incorporated, Labs reviewed, notes from prior ED visits and [place] Controlled Substance Database   ____________________________________________   FINAL CLINICAL IMPRESSION(S) / ED DIAGNOSES  Final diagnoses:  Hypokalemia  Hypomagnesemia      NEW MEDICATIONS STARTED DURING THIS VISIT:  New Prescriptions   MAGNESIUM OXIDE 250 MG TABS    Take 1 tablet (250 mg total) by mouth daily for 7 days.     Note:  This document was prepared using Dragon voice recognition software and may include unintentional dictation errors.    Merlyn Lot, MD 04/11/19 2047

## 2019-04-11 NOTE — Progress Notes (Signed)
Patient is here for a blood pressure check. Patient denies chest pain, palpitations, shortness of breath or visual disturbances. At previous visit blood pressure was 160/84 with a heart rate of 100. Today during nurse visit first check blood pressure was 124/72.  Heart rate is 98. Pre-op visit done today and it was 120/70. She does take blood pressure medications. She has not missed any doses of medication.

## 2019-04-11 NOTE — Patient Instructions (Signed)
Your procedure is scheduled on: 04/12/19 need to arrive by 6:00 am Report to [place]. l [phone] .  Remember: Instructions that are not followed completely may result in serious medical risk, up to and including death, or upon the discretion of your surgeon and anesthesiologist your surgery may need to be rescheduled.     _X__ 1. Do not eat food after midnight the night before your procedure.                 No gum chewing or hard candies. You may drink clear liquids up to 2 hours                 before you are scheduled to arrive for your surgery- DO not drink clear                 liquids within 2 hours of the start of your surgery.                 Clear Liquids include:  water, apple juice without pulp, clear carbohydrate                 drink such as Clearfast or Gatorade, Black Coffee or Tea (Do not add                 anything to coffee or tea).  __X__2.  On the morning of surgery brush your teeth with toothpaste and water, you                 may rinse your mouth with mouthwash if you wish.  Do not swallow any              toothpaste of mouthwash.     _X__ 3.  No Alcohol for 24 hours before or after surgery.   _X__ 4.  Do Not Smoke or use e-cigarettes For 24 Hours Prior to Your Surgery.                 Do not use any chewable tobacco products for at least 6 hours prior to                 surgery.  ____  5.  Bring all medications with you on the day of surgery if instructed.   __X__  6.  Notify your doctor if there is any change in your medical condition      (cold, fever, infections).     Do not wear jewelry, make-up, hairpins, clips or nail polish. Do not wear lotions, powders, or perfumes.  Do not shave 48 hours prior to surgery. Men may shave face and neck. Do not bring valuables to the hospital.    [facility] is not responsible for any belongings or valuables.  Contacts, dentures/partials or body piercings  may not be worn into surgery. Bring a case for your contacts, glasses or hearing aids, a denture cup will be supplied. Leave your suitcase in the car. After surgery it may be brought to your room. For patients admitted to the hospital, discharge time is determined by your treatment team.   Patients discharged the day of surgery will not be allowed to drive home.   Please read over the following fact sheets that you were given:   MRSA Information  __X__ Take these medicines the morning of surgery with A SIP OF WATER:    1. amLODipine (NORVASC)  2. atorvastatin (LIPITOR)  3. gabapentin (NEURONTIN)   4. PRISTIQ   5.  6.  ____ Fleet Enema (as directed)   __X__ Use CHG Soap/SAGE wipes as directed  ____ Use inhalers on the day of surgery  __X__ Stop metformin/Janumet/Farxiga 2 days prior to surgery    ____ Take 1/2 of usual insulin dose the night before surgery. No insulin the morning          of surgery.   ____ Stop Blood Thinners Coumadin/Plavix/Xarelto/Pleta/Pradaxa/Eliquis/Effient/Aspirin  on   Or contact your Surgeon, Cardiologist or Medical Doctor regarding  ability to stop your blood thinners  __X__ Stop Anti-inflammatories 7 days before surgery such as Advil, Ibuprofen, Motrin,  BC or Goodies Powder, Naprosyn, Naproxen, Aleve, Aspirin    __X__ Stop all herbal supplements, fish oil or vitamin E until after surgery.    ____ Bring C-Pap to the hospital.

## 2019-04-11 NOTE — Telephone Encounter (Signed)
Pt in today for BP check - she has normal BP today, she has quit smoking, is aware of moderate risk designation due to COPD, smoking history status, obesity, and HTN (though this is now controlled).  Cleared for surgery as moderate risk.

## 2019-04-11 NOTE — ED Triage Notes (Signed)
Pt to ED due to hypokalemia. Pt was given PO supplements to increase her levels before her knee surgery tomorrow but her values continued to drop. Pt sent for IV potassium.  Pt denies symptoms.

## 2019-04-11 NOTE — Pre-Procedure Instructions (Signed)
K+ today 2.6. Pt's PCP (phone) and surgeon (secure chat) notified. PCP will contact patient today

## 2019-04-11 NOTE — Telephone Encounter (Signed)
[name] for pre-op called states patients potassium was 2.6 at check today.  Patient had potassium checked 5 days ago and it was 3.1 she was started on supplementation for 3 days, states has been taking it as prescribed. Denies diarrheal illness.   Patient denies chest pain, myalgias, weakness.  Patient was instructed to go the ER, she is on her way right now.

## 2019-04-11 NOTE — ED Provider Notes (Signed)
Vitals:   04/11/19 2115 04/11/19 2130  BP: (!) 165/89 (!) 157/91  Pulse: 69 82  Resp: 14 18  Temp:    SpO2: 97% 94%    Patient fully alert and oriented.  She reports that she feels well and is ready to go, her infusions of completed.  She appears well.  Will discharge, she tells me that she cannot eat or drink anything after midnight and that she is following up tomorrow for surgery.  Return precautions and treatment recommendations and follow-up discussed with the patient who is agreeable with the plan.    Delman Kitten, MD 04/11/19 2239

## 2019-04-12 ENCOUNTER — Encounter: Payer: Self-pay | Admitting: *Deleted

## 2019-04-12 ENCOUNTER — Inpatient Hospital Stay: Payer: Medicare Other | Admitting: Anesthesiology

## 2019-04-12 ENCOUNTER — Inpatient Hospital Stay
Admission: RE | Admit: 2019-04-12 | Discharge: 2019-04-14 | DRG: 470 | Disposition: A | Discharge status: Home / Self Care | Payer: Medicare Other | Attending: Orthopedic Surgery | Admitting: Orthopedic Surgery

## 2019-04-12 ENCOUNTER — Other Ambulatory Visit: Payer: Self-pay

## 2019-04-12 ENCOUNTER — Inpatient Hospital Stay: Payer: Medicare Other

## 2019-04-12 ENCOUNTER — Encounter
Admission: RE | Disposition: A | Discharge status: Home / Self Care | Payer: Self-pay | Source: Home / Self Care | Attending: Orthopedic Surgery

## 2019-04-12 DIAGNOSIS — L259 Unspecified contact dermatitis, unspecified cause: Secondary | ICD-10-CM | POA: Diagnosis present

## 2019-04-12 DIAGNOSIS — Z7984 Long term (current) use of oral hypoglycemic drugs: Secondary | ICD-10-CM

## 2019-04-12 DIAGNOSIS — I10 Essential (primary) hypertension: Secondary | ICD-10-CM | POA: Diagnosis present

## 2019-04-12 DIAGNOSIS — M25562 Pain in left knee: Secondary | ICD-10-CM | POA: Diagnosis not present

## 2019-04-12 DIAGNOSIS — Z09 Encounter for follow-up examination after completed treatment for conditions other than malignant neoplasm: Secondary | ICD-10-CM

## 2019-04-12 DIAGNOSIS — Z96652 Presence of left artificial knee joint: Secondary | ICD-10-CM | POA: Diagnosis not present

## 2019-04-12 DIAGNOSIS — M170 Bilateral primary osteoarthritis of knee: Secondary | ICD-10-CM | POA: Diagnosis present

## 2019-04-12 DIAGNOSIS — G4700 Insomnia, unspecified: Secondary | ICD-10-CM | POA: Diagnosis present

## 2019-04-12 DIAGNOSIS — Z818 Family history of other mental and behavioral disorders: Secondary | ICD-10-CM | POA: Diagnosis not present

## 2019-04-12 DIAGNOSIS — K210 Gastro-esophageal reflux disease with esophagitis: Secondary | ICD-10-CM | POA: Diagnosis present

## 2019-04-12 DIAGNOSIS — Z1159 Encounter for screening for other viral diseases: Secondary | ICD-10-CM

## 2019-04-12 DIAGNOSIS — I700 Atherosclerosis of aorta: Secondary | ICD-10-CM | POA: Diagnosis present

## 2019-04-12 DIAGNOSIS — Z981 Arthrodesis status: Secondary | ICD-10-CM

## 2019-04-12 DIAGNOSIS — G8918 Other acute postprocedural pain: Secondary | ICD-10-CM | POA: Diagnosis not present

## 2019-04-12 DIAGNOSIS — Z825 Family history of asthma and other chronic lower respiratory diseases: Secondary | ICD-10-CM

## 2019-04-12 DIAGNOSIS — Z791 Long term (current) use of non-steroidal anti-inflammatories (NSAID): Secondary | ICD-10-CM

## 2019-04-12 DIAGNOSIS — E785 Hyperlipidemia, unspecified: Secondary | ICD-10-CM | POA: Diagnosis not present

## 2019-04-12 DIAGNOSIS — E8881 Metabolic syndrome: Secondary | ICD-10-CM | POA: Diagnosis present

## 2019-04-12 DIAGNOSIS — Z8249 Family history of ischemic heart disease and other diseases of the circulatory system: Secondary | ICD-10-CM | POA: Diagnosis not present

## 2019-04-12 DIAGNOSIS — K589 Irritable bowel syndrome without diarrhea: Secondary | ICD-10-CM | POA: Diagnosis present

## 2019-04-12 DIAGNOSIS — M1712 Unilateral primary osteoarthritis, left knee: Secondary | ICD-10-CM | POA: Diagnosis not present

## 2019-04-12 DIAGNOSIS — E876 Hypokalemia: Secondary | ICD-10-CM | POA: Diagnosis present

## 2019-04-12 DIAGNOSIS — F17210 Nicotine dependence, cigarettes, uncomplicated: Secondary | ICD-10-CM | POA: Diagnosis present

## 2019-04-12 DIAGNOSIS — Z808 Family history of malignant neoplasm of other organs or systems: Secondary | ICD-10-CM | POA: Diagnosis not present

## 2019-04-12 DIAGNOSIS — K219 Gastro-esophageal reflux disease without esophagitis: Secondary | ICD-10-CM | POA: Diagnosis not present

## 2019-04-12 DIAGNOSIS — Z472 Encounter for removal of internal fixation device: Secondary | ICD-10-CM | POA: Diagnosis not present

## 2019-04-12 DIAGNOSIS — J449 Chronic obstructive pulmonary disease, unspecified: Secondary | ICD-10-CM | POA: Diagnosis not present

## 2019-04-12 HISTORY — PX: TOTAL KNEE ARTHROPLASTY: SHX125

## 2019-04-12 LAB — POCT I-STAT 4, (NA,K, GLUC, HGB,HCT)
Glucose, Bld: 114 mg/dL — ABNORMAL HIGH (ref 70–99)
HCT: 37 % (ref 36.0–46.0)
Hemoglobin: 12.6 g/dL (ref 12.0–15.0)
Potassium: 3.5 mmol/L (ref 3.5–5.1)
Sodium: 143 mmol/L (ref 135–145)

## 2019-04-12 LAB — ABO/RH: ABO/RH(D): A POS

## 2019-04-12 SURGERY — ARTHROPLASTY, KNEE, TOTAL
Anesthesia: General | Laterality: Left

## 2019-04-12 MED ORDER — METOCLOPRAMIDE HCL 5 MG/ML IJ SOLN: 5.0000 mg | Freq: Three times a day (TID) | INTRAMUSCULAR | Status: DC | PRN

## 2019-04-12 MED ORDER — FAMOTIDINE 20 MG PO TABS
20.0000 mg | ORAL_TABLET | Freq: Once | ORAL | Status: AC
  Administered 2019-04-12: 20 mg via ORAL

## 2019-04-12 MED ORDER — ACETAMINOPHEN 500 MG PO TABS
ORAL_TABLET | ORAL | Status: AC
  Administered 2019-04-12: 1000 mg via ORAL
  Filled 2019-04-12: qty 2

## 2019-04-12 MED ORDER — HYDROCHLOROTHIAZIDE 25 MG PO TABS
25.0000 mg | ORAL_TABLET | Freq: Every day | ORAL | Status: DC
  Administered 2019-04-13: 25 mg via ORAL
  Filled 2019-04-12: qty 1

## 2019-04-12 MED ORDER — SUGAMMADEX SODIUM 200 MG/2ML IV SOLN
INTRAVENOUS | Status: DC | PRN
  Administered 2019-04-12: 182.4 mg via INTRAVENOUS

## 2019-04-12 MED ORDER — LACTATED RINGERS IV SOLN
INTRAVENOUS | Status: DC
  Administered 2019-04-12: 08:00:00 via INTRAVENOUS

## 2019-04-12 MED ORDER — ONDANSETRON HCL 4 MG/2ML IJ SOLN
INTRAMUSCULAR | Status: DC | PRN
  Administered 2019-04-12: 4 mg via INTRAVENOUS

## 2019-04-12 MED ORDER — FENTANYL CITRATE (PF) 250 MCG/5ML IJ SOLN
INTRAMUSCULAR | Status: AC
  Filled 2019-04-12: qty 5

## 2019-04-12 MED ORDER — ACETAMINOPHEN 500 MG PO TABS
1000.0000 mg | ORAL_TABLET | Freq: Once | ORAL | Status: AC
  Administered 2019-04-12: 1000 mg via ORAL

## 2019-04-12 MED ORDER — MENTHOL 3 MG MT LOZG
1.0000 | LOZENGE | OROMUCOSAL | Status: DC | PRN
  Filled 2019-04-12: qty 9

## 2019-04-12 MED ORDER — ROPIVACAINE HCL 5 MG/ML IJ SOLN
INTRAMUSCULAR | Status: DC | PRN
  Administered 2019-04-12 (×3): 10 mL via PERINEURAL

## 2019-04-12 MED ORDER — AMITRIPTYLINE HCL 10 MG PO TABS
10.0000 mg | ORAL_TABLET | Freq: Every day | ORAL | Status: DC
  Administered 2019-04-12 – 2019-04-13 (×2): 10 mg via ORAL
  Filled 2019-04-12 (×3): qty 1

## 2019-04-12 MED ORDER — SUCCINYLCHOLINE CHLORIDE 20 MG/ML IJ SOLN
INTRAMUSCULAR | Status: AC
  Filled 2019-04-12: qty 1

## 2019-04-12 MED ORDER — FENTANYL CITRATE (PF) 100 MCG/2ML IJ SOLN: 25.0000 ug | INTRAMUSCULAR | Status: DC | PRN

## 2019-04-12 MED ORDER — METOCLOPRAMIDE HCL 10 MG PO TABS: 5.0000 mg | ORAL_TABLET | Freq: Three times a day (TID) | ORAL | Status: DC | PRN

## 2019-04-12 MED ORDER — ONDANSETRON HCL 4 MG/2ML IJ SOLN
INTRAMUSCULAR | Status: AC
  Filled 2019-04-12: qty 2

## 2019-04-12 MED ORDER — BUPIVACAINE LIPOSOME 1.3 % IJ SUSP: 20.0000 mL | Freq: Once | INTRAMUSCULAR | Status: DC

## 2019-04-12 MED ORDER — PHENYLEPHRINE HCL (PRESSORS) 10 MG/ML IV SOLN
INTRAVENOUS | Status: DC | PRN
  Administered 2019-04-12 (×2): 100 ug via INTRAVENOUS

## 2019-04-12 MED ORDER — GABAPENTIN 300 MG PO CAPS
300.0000 mg | ORAL_CAPSULE | Freq: Two times a day (BID) | ORAL | Status: DC
  Administered 2019-04-12 – 2019-04-14 (×4): 300 mg via ORAL
  Filled 2019-04-12 (×2): qty 1
  Filled 2019-04-12: qty 3
  Filled 2019-04-12: qty 1

## 2019-04-12 MED ORDER — AMLODIPINE BESYLATE 5 MG PO TABS
5.0000 mg | ORAL_TABLET | Freq: Every day | ORAL | Status: DC
  Administered 2019-04-13: 5 mg via ORAL
  Filled 2019-04-12: qty 1

## 2019-04-12 MED ORDER — MAGNESIUM OXIDE 400 (241.3 MG) MG PO TABS
200.0000 mg | ORAL_TABLET | Freq: Every day | ORAL | Status: DC
  Administered 2019-04-12 – 2019-04-14 (×3): 200 mg via ORAL
  Filled 2019-04-12 (×3): qty 1

## 2019-04-12 MED ORDER — HYDROMORPHONE HCL 1 MG/ML IJ SOLN
INTRAMUSCULAR | Status: AC
  Administered 2019-04-12: 11:00:00 0.25 mg via INTRAVENOUS
  Filled 2019-04-12: qty 1

## 2019-04-12 MED ORDER — ROCURONIUM BROMIDE 100 MG/10ML IV SOLN
INTRAVENOUS | Status: DC | PRN
  Administered 2019-04-12: 50 mg via INTRAVENOUS

## 2019-04-12 MED ORDER — HYDROMORPHONE HCL 1 MG/ML IJ SOLN
0.2500 mg | INTRAMUSCULAR | Status: DC | PRN
  Administered 2019-04-12 (×4): 0.25 mg via INTRAVENOUS
  Administered 2019-04-12: 25 mg via INTRAVENOUS
  Administered 2019-04-12 (×3): 0.25 mg via INTRAVENOUS

## 2019-04-12 MED ORDER — ACETAMINOPHEN 325 MG PO TABS
325.0000 mg | ORAL_TABLET | Freq: Four times a day (QID) | ORAL | Status: DC | PRN
  Administered 2019-04-14: 650 mg via ORAL
  Filled 2019-04-12: qty 2

## 2019-04-12 MED ORDER — VENLAFAXINE HCL ER 75 MG PO CP24
75.0000 mg | ORAL_CAPSULE | Freq: Every day | ORAL | Status: DC
  Administered 2019-04-13 – 2019-04-14 (×2): 75 mg via ORAL
  Filled 2019-04-12 (×2): qty 1

## 2019-04-12 MED ORDER — MIDAZOLAM HCL 2 MG/2ML IJ SOLN
INTRAMUSCULAR | Status: AC
  Filled 2019-04-12: qty 2

## 2019-04-12 MED ORDER — LIDOCAINE HCL (PF) 1 % IJ SOLN
INTRAMUSCULAR | Status: AC
  Filled 2019-04-12: qty 5

## 2019-04-12 MED ORDER — FENTANYL CITRATE (PF) 100 MCG/2ML IJ SOLN
INTRAMUSCULAR | Status: DC | PRN
  Administered 2019-04-12: 150 ug via INTRAVENOUS
  Administered 2019-04-12 (×2): 100 ug via INTRAVENOUS

## 2019-04-12 MED ORDER — TRAMADOL HCL 50 MG PO TABS
50.0000 mg | ORAL_TABLET | Freq: Four times a day (QID) | ORAL | Status: DC
  Administered 2019-04-13 – 2019-04-14 (×4): 50 mg via ORAL
  Filled 2019-04-12 (×6): qty 1

## 2019-04-12 MED ORDER — OXYCODONE HCL 5 MG PO TABS
5.0000 mg | ORAL_TABLET | ORAL | Status: DC | PRN
  Administered 2019-04-12: 10 mg via ORAL
  Administered 2019-04-12: 5 mg via ORAL
  Administered 2019-04-13: 10 mg via ORAL
  Administered 2019-04-13 – 2019-04-14 (×2): 5 mg via ORAL
  Administered 2019-04-14: 10 mg via ORAL
  Filled 2019-04-12: qty 2
  Filled 2019-04-12 (×2): qty 1
  Filled 2019-04-12 (×3): qty 2
  Filled 2019-04-12: qty 1
  Filled 2019-04-12: qty 2

## 2019-04-12 MED ORDER — ONDANSETRON HCL 4 MG PO TABS: 4.0000 mg | ORAL_TABLET | Freq: Four times a day (QID) | ORAL | Status: DC | PRN

## 2019-04-12 MED ORDER — ATORVASTATIN CALCIUM 20 MG PO TABS
40.0000 mg | ORAL_TABLET | Freq: Every day | ORAL | Status: DC
  Administered 2019-04-13 – 2019-04-14 (×2): 40 mg via ORAL
  Filled 2019-04-12 (×2): qty 2

## 2019-04-12 MED ORDER — SODIUM CHLORIDE 0.9 % IR SOLN
Status: DC | PRN
  Administered 2019-04-12: 09:00:00

## 2019-04-12 MED ORDER — TIZANIDINE HCL 4 MG PO TABS
4.0000 mg | ORAL_TABLET | Freq: Two times a day (BID) | ORAL | Status: DC
  Administered 2019-04-12 – 2019-04-14 (×4): 4 mg via ORAL
  Filled 2019-04-12 (×5): qty 1

## 2019-04-12 MED ORDER — SODIUM CHLORIDE FLUSH 0.9 % IV SOLN
INTRAVENOUS | Status: AC
  Filled 2019-04-12: qty 60

## 2019-04-12 MED ORDER — OXYCODONE HCL 5 MG/5ML PO SOLN: 5.0000 mg | Freq: Once | ORAL | Status: DC | PRN

## 2019-04-12 MED ORDER — BUPIVACAINE-EPINEPHRINE (PF) 0.5% -1:200000 IJ SOLN
INTRAMUSCULAR | Status: DC | PRN
  Administered 2019-04-12: 30 mL

## 2019-04-12 MED ORDER — QUETIAPINE FUMARATE 25 MG PO TABS
25.0000 mg | ORAL_TABLET | Freq: Every day | ORAL | Status: DC
  Administered 2019-04-12 – 2019-04-13 (×2): 25 mg via ORAL
  Filled 2019-04-12 (×2): qty 1

## 2019-04-12 MED ORDER — VITAMIN D 25 MCG (1000 UNIT) PO TABS
1000.0000 [IU] | ORAL_TABLET | Freq: Every day | ORAL | Status: DC
  Administered 2019-04-13 – 2019-04-14 (×2): 1000 [IU] via ORAL
  Filled 2019-04-12 (×2): qty 1

## 2019-04-12 MED ORDER — LIDOCAINE HCL (PF) 2 % IJ SOLN
INTRAMUSCULAR | Status: AC
  Filled 2019-04-12: qty 10

## 2019-04-12 MED ORDER — HYDROMORPHONE HCL 1 MG/ML IJ SOLN
INTRAMUSCULAR | Status: DC | PRN
  Administered 2019-04-12: 0.5 mg via INTRAVENOUS
  Administered 2019-04-12 (×2): .25 mg via INTRAVENOUS

## 2019-04-12 MED ORDER — ASPIRIN 81 MG PO CHEW
81.0000 mg | CHEWABLE_TABLET | Freq: Two times a day (BID) | ORAL | Status: DC
  Administered 2019-04-12 – 2019-04-14 (×4): 81 mg via ORAL
  Filled 2019-04-12 (×4): qty 1

## 2019-04-12 MED ORDER — FAMOTIDINE 20 MG PO TABS
ORAL_TABLET | ORAL | Status: AC
  Administered 2019-04-12: 20 mg via ORAL
  Filled 2019-04-12: qty 1

## 2019-04-12 MED ORDER — PHENOL 1.4 % MT LIQD
1.0000 | OROMUCOSAL | Status: DC | PRN
  Filled 2019-04-12: qty 177

## 2019-04-12 MED ORDER — ROCURONIUM BROMIDE 50 MG/5ML IV SOLN
INTRAVENOUS | Status: AC
  Filled 2019-04-12: qty 1

## 2019-04-12 MED ORDER — CHLORHEXIDINE GLUCONATE 4 % EX LIQD: 60.0000 mL | Freq: Once | CUTANEOUS | Status: DC

## 2019-04-12 MED ORDER — HYDROMORPHONE HCL 1 MG/ML IJ SOLN
0.5000 mg | INTRAMUSCULAR | Status: DC | PRN
  Administered 2019-04-12: 1 mg via INTRAVENOUS
  Filled 2019-04-12: qty 1

## 2019-04-12 MED ORDER — BACITRACIN 50000 UNITS IM SOLR
INTRAMUSCULAR | Status: AC
  Filled 2019-04-12: qty 2

## 2019-04-12 MED ORDER — CEFAZOLIN SODIUM-DEXTROSE 2-4 GM/100ML-% IV SOLN
2.0000 g | Freq: Four times a day (QID) | INTRAVENOUS | Status: AC
  Administered 2019-04-12 (×2): 2 g via INTRAVENOUS
  Filled 2019-04-12 (×2): qty 100

## 2019-04-12 MED ORDER — LACTATED RINGERS IV SOLN
INTRAVENOUS | Status: DC
  Administered 2019-04-12: 07:00:00 via INTRAVENOUS

## 2019-04-12 MED ORDER — DOCUSATE SODIUM 100 MG PO CAPS
100.0000 mg | ORAL_CAPSULE | Freq: Two times a day (BID) | ORAL | Status: DC
  Administered 2019-04-12 – 2019-04-14 (×4): 100 mg via ORAL
  Filled 2019-04-12 (×4): qty 1

## 2019-04-12 MED ORDER — ROPIVACAINE HCL 5 MG/ML IJ SOLN
INTRAMUSCULAR | Status: AC
  Filled 2019-04-12: qty 30

## 2019-04-12 MED ORDER — OXYCODONE HCL 5 MG PO TABS: 5.0000 mg | ORAL_TABLET | Freq: Once | ORAL | Status: DC | PRN

## 2019-04-12 MED ORDER — BUPIVACAINE LIPOSOME 1.3 % IJ SUSP
INTRAMUSCULAR | Status: AC
  Filled 2019-04-12: qty 20

## 2019-04-12 MED ORDER — METFORMIN HCL 500 MG PO TABS
500.0000 mg | ORAL_TABLET | Freq: Every day | ORAL | Status: DC
  Administered 2019-04-13 – 2019-04-14 (×2): 500 mg via ORAL
  Filled 2019-04-12 (×2): qty 1

## 2019-04-12 MED ORDER — PROPOFOL 10 MG/ML IV BOLUS
INTRAVENOUS | Status: DC | PRN
  Administered 2019-04-12: 200 mg via INTRAVENOUS

## 2019-04-12 MED ORDER — TRANEXAMIC ACID-NACL 1000-0.7 MG/100ML-% IV SOLN
1000.0000 mg | INTRAVENOUS | Status: AC
  Administered 2019-04-12: 1000 mg via INTRAVENOUS
  Filled 2019-04-12: qty 100

## 2019-04-12 MED ORDER — VARENICLINE TARTRATE 1 MG PO TABS
1.0000 mg | ORAL_TABLET | Freq: Two times a day (BID) | ORAL | Status: DC
  Administered 2019-04-12 – 2019-04-14 (×4): 1 mg via ORAL
  Filled 2019-04-12 (×5): qty 1

## 2019-04-12 MED ORDER — MAGNESIUM HYDROXIDE 400 MG/5ML PO SUSP
30.0000 mL | Freq: Every day | ORAL | Status: DC | PRN
  Administered 2019-04-13: 30 mL via ORAL
  Filled 2019-04-12: qty 30

## 2019-04-12 MED ORDER — LOSARTAN POTASSIUM-HCTZ 100-25 MG PO TABS: 1.0000 | ORAL_TABLET | Freq: Every day | ORAL | Status: DC

## 2019-04-12 MED ORDER — DEXAMETHASONE SODIUM PHOSPHATE 10 MG/ML IJ SOLN
INTRAMUSCULAR | Status: AC
  Filled 2019-04-12: qty 1

## 2019-04-12 MED ORDER — LIDOCAINE 2% (20 MG/ML) 5 ML SYRINGE
INTRAMUSCULAR | Status: DC | PRN
  Administered 2019-04-12: 100 mg via INTRAVENOUS

## 2019-04-12 MED ORDER — BISACODYL 10 MG RE SUPP: 10.0000 mg | Freq: Every day | RECTAL | Status: DC | PRN

## 2019-04-12 MED ORDER — CEFAZOLIN SODIUM-DEXTROSE 2-4 GM/100ML-% IV SOLN
2.0000 g | INTRAVENOUS | Status: AC
  Administered 2019-04-12: 2 g via INTRAVENOUS

## 2019-04-12 MED ORDER — OXYCODONE HCL 5 MG PO TABS
10.0000 mg | ORAL_TABLET | ORAL | Status: DC | PRN
  Administered 2019-04-12 – 2019-04-13 (×3): 10 mg via ORAL
  Filled 2019-04-12: qty 2

## 2019-04-12 MED ORDER — DEXAMETHASONE SODIUM PHOSPHATE 10 MG/ML IJ SOLN
INTRAMUSCULAR | Status: DC | PRN
  Administered 2019-04-12: 10 mg via INTRAVENOUS

## 2019-04-12 MED ORDER — POTASSIUM CHLORIDE CRYS ER 10 MEQ PO TBCR
10.0000 meq | EXTENDED_RELEASE_TABLET | Freq: Two times a day (BID) | ORAL | Status: DC
  Administered 2019-04-12 – 2019-04-14 (×4): 10 meq via ORAL
  Filled 2019-04-12 (×9): qty 1

## 2019-04-12 MED ORDER — MAGNESIUM CITRATE PO SOLN
1.0000 | Freq: Once | ORAL | Status: DC | PRN
  Filled 2019-04-12: qty 296

## 2019-04-12 MED ORDER — SUGAMMADEX SODIUM 200 MG/2ML IV SOLN
INTRAVENOUS | Status: AC
  Filled 2019-04-12: qty 2

## 2019-04-12 MED ORDER — PROPOFOL 10 MG/ML IV BOLUS
INTRAVENOUS | Status: AC
  Filled 2019-04-12: qty 20

## 2019-04-12 MED ORDER — ACETAMINOPHEN 500 MG PO TABS
1000.0000 mg | ORAL_TABLET | Freq: Four times a day (QID) | ORAL | Status: AC
  Administered 2019-04-12 – 2019-04-13 (×3): 1000 mg via ORAL
  Filled 2019-04-12 (×3): qty 2

## 2019-04-12 MED ORDER — HYDROMORPHONE HCL 1 MG/ML IJ SOLN
INTRAMUSCULAR | Status: AC
  Filled 2019-04-12: qty 1

## 2019-04-12 MED ORDER — HYDROMORPHONE HCL 1 MG/ML IJ SOLN
INTRAMUSCULAR | Status: AC
  Administered 2019-04-12: 0.25 mg via INTRAVENOUS
  Filled 2019-04-12: qty 1

## 2019-04-12 MED ORDER — LOSARTAN POTASSIUM 50 MG PO TABS
100.0000 mg | ORAL_TABLET | Freq: Every day | ORAL | Status: DC
  Administered 2019-04-13: 100 mg via ORAL
  Filled 2019-04-12: qty 2

## 2019-04-12 MED ORDER — LIDOCAINE HCL (PF) 1 % IJ SOLN
INTRAMUSCULAR | Status: DC | PRN
  Administered 2019-04-12: 1 mL via SUBCUTANEOUS

## 2019-04-12 MED ORDER — BUPIVACAINE-EPINEPHRINE (PF) 0.5% -1:200000 IJ SOLN
INTRAMUSCULAR | Status: AC
  Filled 2019-04-12: qty 30

## 2019-04-12 MED ORDER — KETOROLAC TROMETHAMINE 15 MG/ML IJ SOLN
15.0000 mg | Freq: Four times a day (QID) | INTRAMUSCULAR | Status: AC
  Administered 2019-04-12 – 2019-04-13 (×3): 15 mg via INTRAVENOUS
  Filled 2019-04-12 (×3): qty 1

## 2019-04-12 MED ORDER — LACTATED RINGERS IV SOLN
INTRAVENOUS | Status: DC
  Administered 2019-04-12: 12:00:00 via INTRAVENOUS

## 2019-04-12 MED ORDER — SODIUM CHLORIDE 0.9 % IV SOLN
INTRAVENOUS | Status: DC | PRN
  Administered 2019-04-12: 60 mL

## 2019-04-12 MED ORDER — CEFAZOLIN SODIUM-DEXTROSE 2-4 GM/100ML-% IV SOLN
INTRAVENOUS | Status: AC
  Filled 2019-04-12: qty 100

## 2019-04-12 MED ORDER — DEXMEDETOMIDINE HCL 200 MCG/2ML IV SOLN
INTRAVENOUS | Status: DC | PRN
  Administered 2019-04-12: 20 ug via INTRAVENOUS
  Administered 2019-04-12: 12 ug via INTRAVENOUS

## 2019-04-12 MED ORDER — ONDANSETRON HCL 4 MG/2ML IJ SOLN: 4.0000 mg | Freq: Four times a day (QID) | INTRAMUSCULAR | Status: DC | PRN

## 2019-04-12 SURGICAL SUPPLY — 61 items
BASEPLATE TIBIAL LT SZ3 (Knees) ×3 IMPLANT
BLADE SAW 18WX90L 1.27 THK (BLADE) ×3 IMPLANT
BLADE SAW SAG 25X90X1.19 (BLADE) ×3 IMPLANT
BOWL CEMENT MIX W/ADAPTER (MISCELLANEOUS) ×3 IMPLANT
BRUSH SCRUB EZ  4% CHG (MISCELLANEOUS) ×4
BRUSH SCRUB EZ 4% CHG (MISCELLANEOUS) ×2 IMPLANT
CANISTER SUCT 1200ML W/VALVE (MISCELLANEOUS) ×3 IMPLANT
CANISTER SUCT 3000ML PPV (MISCELLANEOUS) ×6 IMPLANT
CEMENT BONE 1-PACK (Cement) ×6 IMPLANT
CHLORAPREP W/TINT 26 (MISCELLANEOUS) ×6 IMPLANT
COMP FEMORAL SZ 4 LEFT NARROW (Orthopedic Implant) ×3 IMPLANT
COMP PATELLA GENESIS 29 OVAL (Orthopedic Implant) ×3 IMPLANT
COMPONENT FEMRL SZ4 LT NARROW (Orthopedic Implant) ×1 IMPLANT
COMPONENT PTLLA GENS 29 OVAL (Orthopedic Implant) ×1 IMPLANT
COOLER POLAR GLACIER W/PUMP (MISCELLANEOUS) ×3 IMPLANT
COVER WAND RF STERILE (DRAPES) ×3 IMPLANT
CUFF TOURN SGL QUICK 30 (TOURNIQUET CUFF) ×2
CUFF TRNQT CYL 30X4X21-28X (TOURNIQUET CUFF) ×1 IMPLANT
DRAPE INCISE IOBAN 66X60 STRL (DRAPES) ×3 IMPLANT
DRAPE SHEET LG 3/4 BI-LAMINATE (DRAPES) ×6 IMPLANT
ELECT REM PT RETURN 9FT ADLT (ELECTROSURGICAL) ×3
ELECTRODE REM PT RTRN 9FT ADLT (ELECTROSURGICAL) ×1 IMPLANT
GAUZE SPONGE 4X4 12PLY STRL (GAUZE/BANDAGES/DRESSINGS) ×3 IMPLANT
GAUZE XEROFORM 1X8 LF (GAUZE/BANDAGES/DRESSINGS) ×3 IMPLANT
GLOVE BIO SURGEON STRL SZ8 (GLOVE) ×3 IMPLANT
GLOVE BIOGEL PI IND STRL 8.5 (GLOVE) ×1 IMPLANT
GLOVE BIOGEL PI INDICATOR 8.5 (GLOVE) ×2
GLOVE INDICATOR 8.0 STRL GRN (GLOVE) ×3 IMPLANT
GLOVE SURG ORTHO 8.0 STRL STRW (GLOVE) ×9 IMPLANT
GOWN STRL REUS W/ TWL LRG LVL3 (GOWN DISPOSABLE) ×1 IMPLANT
GOWN STRL REUS W/ TWL XL LVL3 (GOWN DISPOSABLE) ×1 IMPLANT
GOWN STRL REUS W/TWL LRG LVL3 (GOWN DISPOSABLE) ×2
GOWN STRL REUS W/TWL XL LVL3 (GOWN DISPOSABLE) ×2
HOOD PEEL AWAY FLYTE STAYCOOL (MISCELLANEOUS) ×9 IMPLANT
INSERT ARTI HI FLEX 3-4 SZ10 (Insert) ×3 IMPLANT
IV NS 1000ML (IV SOLUTION) ×2
IV NS 1000ML BAXH (IV SOLUTION) ×1 IMPLANT
KIT PATIENT ADPT GUIDE RT F4 (DISPOSABLE) ×3 IMPLANT
KIT TURNOVER KIT A (KITS) ×3 IMPLANT
MAT ABSORB  FLUID 56X50 GRAY (MISCELLANEOUS) ×2
MAT ABSORB FLUID 56X50 GRAY (MISCELLANEOUS) ×1 IMPLANT
NDL SAFETY ECLIPSE 18X1.5 (NEEDLE) ×1 IMPLANT
NEEDLE HYPO 18GX1.5 SHARP (NEEDLE) ×2
NEEDLE SPNL 20GX3.5 QUINCKE YW (NEEDLE) ×3 IMPLANT
NS IRRIG 1000ML POUR BTL (IV SOLUTION) ×3 IMPLANT
PACK TOTAL KNEE (MISCELLANEOUS) ×3 IMPLANT
PAD DE MAYO PRESSURE PROTECT (MISCELLANEOUS) ×3 IMPLANT
PAD WRAPON POLAR KNEE (MISCELLANEOUS) ×1 IMPLANT
PULSAVAC PLUS IRRIG FAN TIP (DISPOSABLE) ×3
STAPLER SKIN PROX 35W (STAPLE) ×3 IMPLANT
SUCTION FRAZIER HANDLE 10FR (MISCELLANEOUS) ×2
SUCTION TUBE FRAZIER 10FR DISP (MISCELLANEOUS) ×1 IMPLANT
SUT DVC 2 QUILL PDO  T11 36X36 (SUTURE) ×2
SUT DVC 2 QUILL PDO T11 36X36 (SUTURE) ×1 IMPLANT
SUT VIC AB 2-0 CT1 18 (SUTURE) ×3 IMPLANT
SUT VIC AB 2-0 CT1 27 (SUTURE)
SUT VIC AB 2-0 CT1 TAPERPNT 27 (SUTURE) IMPLANT
SUT VIC AB PLUS 45CM 1-MO-4 (SUTURE) ×3 IMPLANT
SYR 30ML LL (SYRINGE) ×9 IMPLANT
TIP FAN IRRIG PULSAVAC PLUS (DISPOSABLE) ×1 IMPLANT
WRAPON POLAR PAD KNEE (MISCELLANEOUS) ×3

## 2019-04-12 NOTE — Anesthesia Procedure Notes (Addendum)
Anesthesia Regional Block: Adductor canal block   Pre-Anesthetic Checklist: ,, timeout performed, Correct Patient, Correct Site, Correct Laterality, Correct Procedure, Correct Position, risks and benefits discussed, surgical consent, pre-op evaluation,  At surgeon's request and post-op pain management  Laterality: Left  Prep: chloraprep       Needles:  Injection technique: Single-shot  Needle Type: Stimiplex     Needle Length: 9cm  Needle Gauge: 20     Additional Needles:   Procedures:,,,, ultrasound used (permanent image in chart),,,,  Narrative:  Start time: 04/12/2019 10:22 AM End time: 04/12/2019 10:25 AM  Performed by: Personally  Anesthesiologist: Durenda Hurt, MD  Additional Notes: Pt consented to post-op block preoperatively by Dr. Amie Critchley.   Negative aspiration.  Negative paresthesia on injection.  Dose given in divided aliquots under ultrasound guidance.

## 2019-04-12 NOTE — Anesthesia Preprocedure Evaluation (Signed)
Anesthesia Evaluation  Patient identified by MRN, date of birth, ID band Patient awake    Reviewed: Allergy & Precautions, H&P , NPO status , Patient's Chart, lab work & pertinent test results  History of Anesthesia Complications Negative for: history of anesthetic complications  Airway Mallampati: III  TM Distance: >3 FB Neck ROM: full    Dental  (+) Chipped   Pulmonary neg shortness of breath, COPD, former smoker,           Cardiovascular Exercise Tolerance: Good hypertension, (-) angina(-) Past MI and (-) DOE      Neuro/Psych PSYCHIATRIC DISORDERS  Neuromuscular disease    GI/Hepatic Neg liver ROS, PUD, GERD  Medicated and Controlled,  Endo/Other  negative endocrine ROS  Renal/GU      Musculoskeletal   Abdominal   Peds  Hematology negative hematology ROS (+)   Anesthesia Other Findings Patient reports back pain and other back symptoms that come and go  Past Medical History: No date: Arthritis     Comment:  bilateral knees No date: Contact dermatitis and other eczema, due to unspecified cause No date: COPD (chronic obstructive pulmonary disease) (HCC) No date: Depressive disorder No date: Dysmetabolic syndrome X No date: GERD (gastroesophageal reflux disease) No date: Hyperlipidemia No date: Hypertension No date: IBS (irritable bowel syndrome) No date: Insomnia No date: Lumbago No date: Nonspecific abnormal electrocardiogram (ECG) (EKG) No date: Other ovarian failure(256.39) No date: Postmenopausal atrophic vaginitis No date: Symptomatic menopausal or female climacteric states No date: Unspecified vitamin D deficiency  Past Surgical History: No date: BACK SURGERY No date: BREAST BIOPSY; Left     Comment:  neg No date: BREAST LUMPECTOMY; Left 08/15/2018: CARPAL TUNNEL RELEASE; Left     Comment:  Procedure: CARPAL TUNNEL RELEASE;  Surgeon: Earnestine Leys, MD;  Location: [facility] ORS;   Service: Orthopedics;                Laterality: Left; No date: CERVICAL DISCECTOMY     Comment:  x 2; metal plate No date: CHOLECYSTECTOMY 08/08/2018: COLONOSCOPY WITH PROPOFOL; N/A     Comment:  Procedure: COLONOSCOPY WITH PROPOFOL;  Surgeon:               Virgel Manifold, MD;  Location: [facility];                Service: Endoscopy;  Laterality: N/A; 03/21/2015: DILATATION & CURETTAGE/HYSTEROSCOPY WITH MYOSURE; N/A     Comment:  Procedure: DILATATION & CURETTAGE/HYSTEROSCOPY;                Surgeon: Aletha Halim, MD;  Location: [facility] OR[facility];                Service: Gynecology;  Laterality: N/A; No date: DILATION AND CURETTAGE OF UTERUS No date: ENDOMETRIAL ABLATION 04/13/2018: ESOPHAGOGASTRODUODENOSCOPY (EGD) WITH PROPOFOL; N/A     Comment:  Procedure: ESOPHAGOGASTRODUODENOSCOPY (EGD) WITH               PROPOFOL;  Surgeon: Virgel Manifold, MD;  Location:               [facility];  Service: Endoscopy;  Laterality: N/A; 08/08/2018: ESOPHAGOGASTRODUODENOSCOPY (EGD) WITH PROPOFOL; N/A     Comment:  Procedure: ESOPHAGOGASTRODUODENOSCOPY (EGD) WITH               PROPOFOL;  Surgeon: Virgel Manifold, MD;  Location:  [facility];  Service: Endoscopy;  Laterality: N/A; No date: SPINAL FUSION     Comment:  lumbar x2 No date: TONSILLECTOMY No date: TUBAL LIGATION  BMI    Body Mass Index:  30.56 kg/m      Reproductive/Obstetrics negative OB ROS                             Anesthesia Physical Anesthesia Plan  ASA: III  Anesthesia Plan: General ETT   Post-op Pain Management:    Induction: Intravenous  PONV Risk Score and Plan: Ondansetron, Dexamethasone, Midazolam and Treatment may vary due to age or medical condition  Airway Management Planned: Oral ETT  Additional Equipment:   Intra-op Plan:   Post-operative Plan: Extubation in OR  Informed Consent: I have reviewed the patients History and Physical, chart, labs  and discussed the procedure including the risks, benefits and alternatives for the proposed anesthesia with the patient or authorized representative who has indicated his/her understanding and acceptance.     Dental Advisory Given  Plan Discussed with: Anesthesiologist, CRNA and Surgeon  Anesthesia Plan Comments: (Patient consented for risks of anesthesia including but not limited to:  - adverse reactions to medications - damage to teeth, lips or other oral mucosa - sore throat or hoarseness - Damage to heart, brain, lungs or loss of life  Patient voiced understanding.)        Anesthesia Quick Evaluation

## 2019-04-12 NOTE — Op Note (Signed)
DATE OF SURGERY:  04/12/2019 TIME: 9:54 AM  PATIENT NAME:  [name]   AGE: 58 y.o.    PRE-OPERATIVE DIAGNOSIS:  M17.12 Unilateral primary osteoarthritis, left knee  POST-OPERATIVE DIAGNOSIS:  Same  PROCEDURE:  Procedure(s): TOTAL KNEE ARTHROPLASTY, LEFT  SURGEON:  Lovell Sheehan, MD   ASSISTANT:  Carlynn Spry, PA-C  OPERATIVE IMPLANTS: Tamala [employer]hew, Cruciate Retaining Oxinium Femoral component size  4 Narrow, Fixed Bearing Tray size 3, Patella polyethylene 3-peg oval button size 29 mm, with a 10 mm HighFlex insert.   PREOPERATIVE INDICATIONS:  [name] is an 58 y.o. female who has a diagnosis of M17.12 Unilateral primary osteoarthritis, left knee and elected for a left total knee arthroplasty after failing nonoperative treatment, including activity modification, pain medication, physical therapy and injections who has significant impairment of their activities of daily living.  Radiographs have demonstrated tricompartmental osteoarthritis joint space narrowing, osteophytes, subchondral sclerosis and cyst formation.  The risks, benefits, and alternatives were discussed at length including but not limited to the risks of infection, bleeding, nerve or blood vessel injury, knee stiffness, fracture, dislocation, loosening or failure of the hardware and the need for further surgery. Medical risks include but not limited to DVT and pulmonary embolism, myocardial infarction, stroke, pneumonia, respiratory failure and death. I discussed these risks with the patient in my office prior to the date of surgery. They understood these risks and were willing to proceed.  OPERATIVE FINDINGS AND UNIQUE ASPECTS OF THE CASE:  All three compartments with advanced and severe degenerative changes, large osteophytes and an abundance of synovial fluid. Significant deformity was also noted. A decision was made to proceed with total knee arthroplasty.   OPERATIVE DESCRIPTION:  The patient was  brought to the operative room and placed in a supine position after undergoing placement of a general anesthetic. IV antibiotics were given. Patient received tranexamic acid. The lower extremity was prepped and draped in the usual sterile fashion.  A time out was performed to verify the patient's name, date of birth, medical record number, correct site of surgery and correct procedure to be performed. The timeout was also used to confirm the patient received antibiotics and that appropriate instruments, implants and radiographs studies were available in the room.  The leg was elevated and exsanguinated with an Esmarch and the tourniquet was inflated to 250 mmHg.  A midline incision was made over the left knee.. A medial parapatellar arthrotomy was then made and the patella subluxed laterally and the knee was brought into 90 of flexion. Hoffa's fat pad along with the anterior cruciate ligament was resected and the medial joint line was exposed.  Attention was then turned to preparation of the patella. The thickness of the patella was measured with a caliper, the diameter measured with the patella templates.  The patella resection was then made with an oscillating saw using the patella cutting guide.  The 29 mm button fit appropriately.  3 peg holes for the patella component were then drilled.  The extramedullary tibial cutting guide was then placed using the anterior tibial crest and second ray of the foot as a reference.  The tibial cutting guide was adjusted to allow for appropriate posterior slope.  The tibial cutting block was pinned into position. The slotted stylus was used to measure the proximal tibial resection of 9 mm off the high lateral side. Care was taken during the tibial resection to protect the medial and collateral ligaments.  The resected tibial bone was  removed.  The distal femur was resected using the Visionaire cutting guide.  Care was taken to protect the collateral ligaments during  distal femoral resection.  The distal femoral resection was performed with an oscillating saw. The femoral cutting guide was then removed. Extension gap was measured with a 10 mm spacer block and alignment and extension was confirmed using a long alignment rod. The femur was sized to be a 4 narrow. Rotation of the referencing guide was checked with the epicondylar axis and Whitesides line. Then the 4-in-1 cutting jig was then applied to the distal femur. A stylus was used to confirm that the anterior femur would not be notched.   Then the anterior, posterior and chamfer femoral cuts were then made with an oscillating saw.  The knee was distracted and all posterior osteophytes were removed.  The flexion gap was then measured with a flexion spacer block and long alignment rod and was found to be symmetric with the extension gap and perpendicular to mechanical axis of the tibia.  The proximal tibia plateau was then sized with trial trays. The best coverage was achieved with a size 3. This tibial tray was then pinned into position. The proximal tibia was then prepared with the keel punch.  After tibial preparation was completed, all trial components were inserted with polyethylene trials. The knee achieved full extension and flexed to 120 degrees. Ligament were stable to varus and valgus at full extension as well as 30, 60 and 90 degrees of flexion.   The trials were then placed. Knee was taken through a full range of motion and deemed to be stable with the trial components. All trial components were then removed.  The joint was copiously irrigated with pulse lavage.  The final total knee arthroplasty components were then cemented into place. The knee was held in extension while cement was allowed to cure.The knee was taken through a range of motion and the patella tracked well and the knee was again irrigated copiously.  The knee capsule was then injected with Exparel.  The medial arthrotomy was closed with #1  Vicryl and #2 Quill. The subcutaneous tissue closed with  2-0 vicryl, and skin approximated with staples.  A dry sterile and compressive dressing was applied.  A Polar Care was applied to the operative knee.  The patient was awakened and brought to the PACU in stable and satisfactory condition.  All sharp, lap and instrument counts were correct at the conclusion the case. I spoke with the patient's family in the postop consultation room to let them know the case had been performed without complication and the patient was stable in recovery room.   Total tourniquet time was 44 minutes.

## 2019-04-12 NOTE — Anesthesia Procedure Notes (Signed)
Procedure Name: Intubation Date/Time: 04/12/2019 8:55 AM Performed by: Marsh Dolly, CRNA Pre-anesthesia Checklist: Patient identified, Patient being monitored, Timeout performed, Emergency Drugs available and Suction available Patient Re-evaluated:Patient Re-evaluated prior to induction Oxygen Delivery Method: Circle system utilized Preoxygenation: Pre-oxygenation with 100% oxygen Induction Type: IV induction Ventilation: Mask ventilation without difficulty Laryngoscope Size: 3, Miller and McGraph Grade View: Grade I Tube type: Oral Tube size: 7.5 mm Number of attempts: 2 Airway Equipment and Method: Stylet Placement Confirmation: ETT inserted through vocal cords under direct vision,  positive ETCO2 and breath sounds checked- equal and bilateral Secured at: 21 cm Tube secured with: Tape Dental Injury: Teeth and Oropharynx as per pre-operative assessment

## 2019-04-12 NOTE — Transfer of Care (Signed)
Immediate Anesthesia Transfer of Care Note  Patient: [name]  Procedure(s) Performed: TOTAL KNEE ARTHROPLASTY (Left )  Patient Location: PACU  Anesthesia Type:General  Level of Consciousness: awake, alert  and oriented  Airway & Oxygen Therapy: Patient Spontanous Breathing and Patient connected to nasal cannula oxygen  Post-op Assessment: Report given to RN and Post -op Vital signs reviewed and stable  Post vital signs: Reviewed and stable  Last Vitals:  Vitals Value Taken Time  BP 119/83 04/12/2019 10:01 AM  Temp 36.4 C 04/12/2019 10:00 AM  Pulse 77 04/12/2019 10:04 AM  Resp 21 04/12/2019 10:04 AM  SpO2 100 % 04/12/2019 10:04 AM  Vitals shown include unvalidated device data.  Last Pain:  Vitals:   04/12/19 0618  TempSrc: Temporal  PainSc: 6          Complications: No apparent anesthesia complications

## 2019-04-12 NOTE — Evaluation (Signed)
Physical Therapy Evaluation Patient Details Name: [name] MRN: [id number] DOB: 07-15-1961 Today's Date: 04/12/2019   History of Present Illness  admitted for acute hospitalization status post L TKR, WBAT (04/12/19).  Clinical Impression  Upon evaluation, patient alert and oriented; follows commands and demonstrates good effort with assessment with min encouragement from therapist.  Rates pain 4-5/10 L LE; mild sensory deficit persists, but fair/good motor function noted at all joints.  Fair/good L knee strength (at least 3-/5) and ROM (4-78 degrees), limited by pain.  Able to complete bed mobility, sit/stand, basic transfers and gait (5') with RW, min assist from therapist.  Fair/good loading/stance time to L LE; no buckling or LOB.  Patient declined additional gait efforts at this time.  Do anticipate consistent progress towards all goals as pain control optimized.  Will continue to assess/progress as appropriate. Would benefit from skilled PT to address above deficits and promote optimal return to PLOF; recommend transition to home with follow up therapy as arranged by surgeon.    Follow Up Recommendations Follow surgeon's recommendation for DC plan and follow-up therapies    Equipment Recommendations  Rolling walker with 5" wheels;3in1 (PT)    Recommendations for Other Services       Precautions / Restrictions Precautions Precautions: Fall Restrictions Weight Bearing Restrictions: Yes LLE Weight Bearing: Weight bearing as tolerated      Mobility  Bed Mobility Overal bed mobility: Needs Assistance Bed Mobility: Supine to Sit     Supine to sit: Min assist     General bed mobility comments: assist for L LE Management and control with eccentric lowerig once upright at edge of bed  Transfers Overall transfer level: Needs assistance Equipment used: Rolling walker (2 wheeled) Transfers: Sit to/from Stand Sit to Stand: Min assist         General transfer comment: L  LE anterior to BOS, limited functional ROM/WBing with movement transition  Ambulation/Gait Ambulation/Gait assistance: Min assist   Assistive device: Rolling walker (2 wheeled)     Gait velocity interpretation: <1.8 ft/sec, indicate of risk for recurrent falls General Gait Details: decreased stance time L LE due to pain/fear of WBing; mod reliance on RW.  Patient refused further distance due to pain; will progress next session  Stairs            Wheelchair Mobility    Modified Rankin (Stroke Patients Only)       Balance Overall balance assessment: Needs assistance Sitting-balance support: No upper extremity supported;Feet supported Sitting balance-Leahy Scale: Good     Standing balance support: Bilateral upper extremity supported Standing balance-Leahy Scale: Fair                               Pertinent Vitals/Pain Pain Assessment: 0-10 Pain Score: 5  Pain Location: L knee Pain Descriptors / Indicators: Aching;Grimacing;Guarding Pain Intervention(s): Limited activity within patient's tolerance;Monitored during session;Premedicated before session;Repositioned    Home Living Family/patient expects to be discharged to:: Private residence Living Arrangements: Spouse/significant other;Children Available Help at Discharge: Family;Available 24 hours/day Type of Home: House       Home Layout: One level        Prior Function Level of Independence: Independent         Comments: Indep with ADLs, household and community mobilization; denies recent fall history     Hand Dominance        Extremity/Trunk Assessment   Upper Extremity Assessment Upper Extremity Assessment:  Overall [facility] for tasks assessed    Lower Extremity Assessment Lower Extremity Assessment: (L knee grossly 3-/5, limited by pain; mild dullness still reported throughout L LE.  R LE grossly WFL)       Communication   Communication: No difficulties  Cognition  Arousal/Alertness: Awake/alert Behavior During Therapy: WFL for tasks assessed/performed Overall Cognitive Status: Within Functional Limits for tasks assessed                                        General Comments      Exercises Total Joint Exercises Goniometric ROM: L knee: 4-78 degrees, act assist Other Exercises Other Exercises: Supine L LE therex, 1x10, AROM for muscular strength/endurance: ankle pumps, quad sets, SAQs, heel slides, SLR.  Fair/good L quad activation with increased time/encouragement for full active effort/movement   Assessment/Plan    PT Assessment Patient needs continued PT services  PT Problem List Decreased strength;Decreased range of motion;Decreased activity tolerance;Decreased balance;Decreased mobility;Decreased knowledge of use of DME;Decreased safety awareness;Pain       PT Treatment Interventions DME instruction;Gait training;Stair training;Functional mobility training;Therapeutic activities;Therapeutic exercise;Patient/family education;Balance training    PT Goals (Current goals can be found in the Care Plan section)  Acute Rehab PT Goals Patient Stated Goal: to return home PT Goal Formulation: With patient Time For Goal Achievement: 04/26/19 Potential to Achieve Goals: Good    Frequency BID   Barriers to discharge        Co-evaluation               AM-PAC PT "6 Clicks" Mobility  Outcome Measure Help needed turning from your back to your side while in a flat bed without using bedrails?: A Little Help needed moving from lying on your back to sitting on the side of a flat bed without using bedrails?: A Little Help needed moving to and from a bed to a chair (including a wheelchair)?: A Little Help needed standing up from a chair using your arms (e.g., wheelchair or bedside chair)?: A Little Help needed to walk in hospital room?: A Little Help needed climbing 3-5 steps with a railing? : A Little 6 Click Score: 18     End of Session Equipment Utilized During Treatment: Gait belt Activity Tolerance: Patient tolerated treatment well Patient left: in chair;with call bell/phone within reach;with chair alarm set Nurse Communication: Mobility status PT Visit Diagnosis: Muscle weakness (generalized) (M62.81);Difficulty in walking, not elsewhere classified (R26.2);Pain Pain - Right/Left: Left Pain - part of body: Knee    Time: 1510-1541 PT Time Calculation (min) (ACUTE ONLY): 31 min   Charges:   PT Evaluation $PT Eval Moderate Complexity: 1 Mod PT Treatments $Therapeutic Exercise: 8-22 mins       Bama Hanselman H. Owens Shark, PT, DPT, NCS 04/12/19, 6:41 PM [phone]

## 2019-04-12 NOTE — Anesthesia Post-op Follow-up Note (Signed)
Anesthesia QCDR form completed.        

## 2019-04-12 NOTE — H&P (Signed)
The patient has been re-examined, and the chart reviewed, and there have been no interval changes to the documented history and physical.  Plan a left total knee replacement today.  Anesthesia is consulted regarding a peripheral nerve block for post-operative pain.  The risks, benefits, and alternatives have been discussed at length, and the patient is willing to proceed.     

## 2019-04-13 ENCOUNTER — Encounter: Payer: Self-pay | Admitting: Orthopedic Surgery

## 2019-04-13 LAB — CBC
HCT: 32.7 % — ABNORMAL LOW (ref 36.0–46.0)
Hemoglobin: 10.6 g/dL — ABNORMAL LOW (ref 12.0–15.0)
MCH: 30 pg (ref 26.0–34.0)
MCHC: 32.4 g/dL (ref 30.0–36.0)
MCV: 92.6 fL (ref 80.0–100.0)
Platelets: 266 10*3/uL (ref 150–400)
RBC: 3.53 MIL/uL — ABNORMAL LOW (ref 3.87–5.11)
RDW: 12.5 % (ref 11.5–15.5)
WBC: 15.2 10*3/uL — ABNORMAL HIGH (ref 4.0–10.5)
nRBC: 0 % (ref 0.0–0.2)

## 2019-04-13 LAB — BASIC METABOLIC PANEL
Anion gap: 11 (ref 5–15)
BUN: 12 mg/dL (ref 6–20)
CO2: 24 mmol/L (ref 22–32)
Calcium: 8.8 mg/dL — ABNORMAL LOW (ref 8.9–10.3)
Chloride: 105 mmol/L (ref 98–111)
Creatinine, Ser: 0.71 mg/dL (ref 0.44–1.00)
GFR calc Af Amer: >60 mL/min (ref >60)
GFR calc non Af Amer: >60 mL/min (ref >60)
Glucose, Bld: 153 mg/dL — ABNORMAL HIGH (ref 70–99)
Potassium: 3.5 mmol/L (ref 3.5–5.1)
Sodium: 140 mmol/L (ref 135–145)

## 2019-04-13 NOTE — Anesthesia Postprocedure Evaluation (Signed)
Anesthesia Post Note  Patient: [name]  Procedure(s) Performed: TOTAL KNEE ARTHROPLASTY (Left )  Patient location during evaluation: PACU Anesthesia Type: General Level of consciousness: awake and alert Pain management: pain level controlled Vital Signs Assessment: post-procedure vital signs reviewed and stable Respiratory status: spontaneous breathing, nonlabored ventilation and respiratory function stable Cardiovascular status: stable Postop Assessment: no apparent nausea or vomiting Anesthetic complications: no Comments: Adductor canal block placed in PACU     Last Vitals:  Vitals:   04/13/19 0358 04/13/19 0808  BP: 109/65 (!) 140/95  Pulse: 81 72  Resp: 18 17  Temp: 37 C 36.6 C  SpO2: 99% 100%    Last Pain:  Vitals:   04/13/19 0808  TempSrc: Oral  PainSc:                  Durenda Hurt

## 2019-04-13 NOTE — Progress Notes (Signed)
Physical Therapy Treatment Patient Details Name: [name] MRN: [id number] DOB: 10-May-1961 Today's Date: 04/13/2019    History of Present Illness admitted for acute hospitalization status post L TKR, WBAT (04/12/19).    PT Comments    Patient with increased pain this PM, but declined pain mediation until after session (RN informed/aware).  Able to complete stair/curb training with cga/close sup; good L knee control and stability in periods of modified SLS. Voices understanding of car transfer technique, HEP. Excellent progress towards mobility goals; safe for anticipated discharge home next date.   Follow Up Recommendations  Outpatient PT     Equipment Recommendations  Rolling walker with 5" wheels;3in1 (PT)    Recommendations for Other Services       Precautions / Restrictions Precautions Precautions: Fall Restrictions Weight Bearing Restrictions: Yes LLE Weight Bearing: Weight bearing as tolerated    Mobility  Bed Mobility Overal bed mobility: Modified Independent                Transfers Overall transfer level: Needs assistance Equipment used: Rolling walker (2 wheeled) Transfers: Sit to/from Stand Sit to Stand: Supervision;Modified independent (Device/Increase time)         General transfer comment: continues with excessive weight shift to R hemi-body  Ambulation/Gait Ambulation/Gait assistance: Supervision Gait Distance (Feet): 200 Feet Assistive device: Rolling walker (2 wheeled)       General Gait Details: mild decrease in L LE stance time; slow and guarded gait performance, but good L knee control   Stairs Stairs: Yes Stairs assistance: Min guard Stair Management: Two rails Number of Stairs: 4 General stair comments: up/down 4 steps with bilat rails, cga/close sup; min cuing for technique.  Good L knee control/stability in periods of modified SLS.  Up/down single curb (x2) with RW, cga/close sup.  Patient comfortable with performance,  no additional questions.   Wheelchair Mobility    Modified Rankin (Stroke Patients Only)       Balance Overall balance assessment: Needs assistance Sitting-balance support: No upper extremity supported;Feet supported Sitting balance-Leahy Scale: Good     Standing balance support: Bilateral upper extremity supported Standing balance-Leahy Scale: Fair                              Cognition Arousal/Alertness: Awake/alert Behavior During Therapy: WFL for tasks assessed/performed Overall Cognitive Status: Within Functional Limits for tasks assessed                                        Exercises Other Exercises Other Exercises: Issued handout with written/pictorial descriptions of supine therex for use as HEP; patient voiced understanding. Other Exercises: Verbally reviewed technique for car transfer; patient voiced understanding and agreement.    General Comments        Pertinent Vitals/Pain Pain Assessment: 0-10 Pain Score: 8  Pain Descriptors / Indicators: Aching;Grimacing;Guarding Pain Intervention(s): Limited activity within patient's tolerance;Monitored during session;Patient requesting pain meds-RN notified;RN gave pain meds during session    Home Living                      Prior Function            PT Goals (current goals can now be found in the care plan section) Acute Rehab PT Goals Patient Stated Goal: to do what I've got  to do PT  Goal Formulation: With patient Time For Goal Achievement: 04/27/19 Potential to Achieve Goals: Good Progress towards PT goals: Progressing toward goals    Frequency    BID      PT Plan Current plan remains appropriate    Co-evaluation              AM-PAC PT "6 Clicks" Mobility   Outcome Measure  Help needed turning from your back to your side while in a flat bed without using bedrails?: None Help needed moving from lying on your back to sitting on the side of a flat  bed without using bedrails?: None Help needed moving to and from a bed to a chair (including a wheelchair)?: None Help needed standing up from a chair using your arms (e.g., wheelchair or bedside chair)?: None Help needed to walk in hospital room?: A Little Help needed climbing 3-5 steps with a railing? : A Little 6 Click Score: 22    End of Session Equipment Utilized During Treatment: Gait belt Activity Tolerance: Patient tolerated treatment well Patient left: in bed;with bed alarm set;with call bell/phone within reach Nurse Communication: Mobility status PT Visit Diagnosis: Muscle weakness (generalized) (M62.81);Difficulty in walking, not elsewhere classified (R26.2);Pain Pain - Right/Left: Left Pain - part of body: Knee     Time: 6606-3016 PT Time Calculation (min) (ACUTE ONLY): 23 min  Charges:  $Gait Training: 23-37 mins                    Rachana Malesky H. Owens Shark, PT, DPT, NCS 04/13/19, 2:27 PM [phone]

## 2019-04-13 NOTE — TOC Initial Note (Signed)
Transition of Care (TOC) - Initial/Assessment Note    Patient Details  Name: [name] MRN: [id number] Date of Birth: 04/07/1961  Transition of Care (TOC) CM/SW Contact:    ,  M, LCSW Phone Number: [phone]  04/13/2019, 3:57 PM  Clinical Narrative: Clinical Social Worker (CSW) received consult to coordinate D/C services. PT is recommending per surgeon. CSW confirmed with office staff at [facility] that patient has an outpatient PT appointment scheduled for Wednesday June 17th at 12:40 pm at [facility] in [place]. CSW met with patient and made her aware of above. Per patient she lives in  with her husband Samuel and 56 y.o daughter Zakiya. Per patient she is agreeable to go to outpatient PT at the [facility] office and reported that her husband and daughter can provide transport. Patient requested a RW and BSC. CSW notified [employer] agency representative Brad of need for equipment and he stated he will delivery it. Patient also asked for a shower bench however per Brad it will not be covered by insurance and patient stated that she does not want it if it is not covered by insurance. Patient reported no other needs or concerns.   Plan is for patient to D/C home and start outpatient PT at [facility] office on Wednesday 04/19/2019 at 12:40 pm. Patient will have a RW and BSC delivered to her room prior to D/C. CSW will continue to follow and assist as needed.           Expected Discharge Plan: OP Rehab Barriers to Discharge: Continued Medical Work up   Patient Goals and CMS Choice Patient states their goals for this hospitalization and ongoing recovery are:: To improve pain CMS Medicare.gov Compare Post Acute Care list provided to:: Other (Comment Required)(Patient will go to outpatient PT.) Choice offered to / list presented to : NA  Expected Discharge Plan and Services Expected Discharge Plan: OP Rehab In-house Referral: Clinical Social Work Discharge  Planning Services: CM Consult   Living arrangements for the past 2 months: Single Family Home                 DME Arranged: Bedside commode, Walker rolling DME Agency: [employer] Date DME Agency Contacted: 04/13/19 Time DME Agency Contacted: 1545 Representative spoke with at DME Agency: Brad            Prior Living Arrangements/Services Living arrangements for the past 2 months: Single Family Home Lives with:: Spouse, Adult Children Patient language and need for interpreter reviewed:: No Do you feel safe going back to the place where you live?: Yes      Need for Family Participation in Patient Care: No (Comment) Care giver support system in place?: Yes (comment)   Criminal Activity/Legal Involvement Pertinent to Current Situation/Hospitalization: No - Comment as needed  Activities of Daily Living Home Assistive Devices/Equipment: None ADL Screening (condition at time of admission) Patient's cognitive ability adequate to safely complete daily activities?: Yes Is the patient deaf or have difficulty hearing?: No Does the patient have difficulty seeing, even when wearing glasses/contacts?: No Does the patient have difficulty concentrating, remembering, or making decisions?: No Patient able to express need for assistance with ADLs?: Yes Does the patient have difficulty dressing or bathing?: No Independently performs ADLs?: Yes (appropriate for developmental age) Does the patient have difficulty walking or climbing stairs?: Yes Weakness of Legs: None Weakness of Arms/Hands: None  Permission Sought/Granted Permission sought to share information with : Other (comment)(DME provider) Permission granted to share information with :   Yes, Verbal Permission Granted              Emotional Assessment Appearance:: Appears stated age   Affect (typically observed): Accepting, Calm, Pleasant Orientation: : Oriented to Self, Oriented to Place, Oriented to  Time, Oriented to  Situation Alcohol / Substance Use: Not Applicable Psych Involvement: No (comment)  Admission diagnosis:  M17.12 Unilateral primary osteoarthritis, left knee Patient Active Problem List   Diagnosis Date Noted  . S/P TKR (total knee replacement) using cement, left 04/12/2019  . Overweight (BMI 25.0-29.9) 04/06/2019  . Peptic ulcer   . Encounter for screening colonoscopy   . Benign neoplasm of ascending colon   . Diverticulosis of large intestine without diverticulitis   . Simple chronic bronchitis (HCC) 08/03/2018  . Pancreatic lesion 04/21/2018  . Abdominal aortic atherosclerosis (HCC) 04/21/2018  . Reflux esophagitis   . Stomach irritation   . Acute peptic ulcer of stomach   . Gastric polyp   . Abdominal pain, epigastric   . Trigger thumb of right hand 02/25/2018  . Carpal tunnel syndrome on both sides 10/21/2017  . CRP elevated 08/09/2017  . Elevated C-reactive protein 07/28/2017  . Osteoarthritis of both knees 02/19/2016  . COPD, mild (HCC) 05/08/2015  . Vitamin D deficiency 05/07/2015  . Metabolic syndrome 05/07/2015  . Eczema 05/07/2015  . Chronic insomnia 05/07/2015  . Hypertension, benign 05/07/2015  . Dyslipidemia 05/07/2015  . Hyperglycemia 05/07/2015  . Chronic low back pain 05/07/2015  . IBS (irritable bowel syndrome) 05/07/2015  . Gastroesophageal reflux disease without esophagitis 05/07/2015  . Major depression, chronic (HCC) 05/07/2015  . Menopausal syndrome (hot flashes) 05/07/2015  . History of postmenopausal bleeding 01/14/2015   PCP:  Sowles, Krichna, MD Pharmacy:   [facility] [place] - [address] [place] [address] Phone: [phone] Fax: [phone]  [facility] - [place], [place] - [address] [place] [address] Phone: [phone] Fax: [phone]     Social Determinants of Health (SDOH) Interventions    Readmission Risk Interventions No flowsheet data  found.  

## 2019-04-13 NOTE — Progress Notes (Signed)
  Subjective:  Patient reports pain as moderate.    Objective:   VITALS:   Vitals:   04/12/19 1650 04/12/19 2340 04/13/19 0112 04/13/19 0358  BP: 139/88 (!) 89/60 95/76 109/65  Pulse: 71 78 78 81  Resp: 17 18  18   Temp: 97.9 F (36.6 C)   98.6 F (37 C)  TempSrc: Oral   Oral  SpO2: 94% 99% 100% 99%  Weight:      Height:        PHYSICAL EXAM:  Neurovascular intact Sensation intact distally Dorsiflexion/Plantar flexion intact Incision: dressing C/D/I Compartment soft  LABS  Results for orders placed or performed during the hospital encounter of 04/12/19 (from the past 24 hour(s))  CBC     Status: Abnormal   Collection Time: 04/13/19  3:33 AM  Result Value Ref Range   WBC 15.2 (H) 4.0 - 10.5 K/uL   RBC 3.53 (L) 3.87 - 5.11 MIL/uL   Hemoglobin 10.6 (L) 12.0 - 15.0 g/dL   HCT 32.7 (L) 36.0 - 46.0 %   MCV 92.6 80.0 - 100.0 fL   MCH 30.0 26.0 - 34.0 pg   MCHC 32.4 30.0 - 36.0 g/dL   RDW 12.5 11.5 - 15.5 %   Platelets 266 150 - 400 K/uL   nRBC 0.0 0.0 - 0.2 %  Basic metabolic panel     Status: Abnormal   Collection Time: 04/13/19  3:33 AM  Result Value Ref Range   Sodium 140 135 - 145 mmol/L   Potassium 3.5 3.5 - 5.1 mmol/L   Chloride 105 98 - 111 mmol/L   CO2 24 22 - 32 mmol/L   Glucose, Bld 153 (H) 70 - 99 mg/dL   BUN 12 6 - 20 mg/dL   Creatinine, Ser 0.71 0.44 - 1.00 mg/dL   Calcium 8.8 (L) 8.9 - 10.3 mg/dL   GFR calc non Af Amer >60 >60 mL/min   GFR calc Af Amer >60 >60 mL/min   Anion gap 11 5 - 15    Dg Knee Left Port  Result Date: 04/12/2019 CLINICAL DATA:  Status post left knee replacement EXAM: PORTABLE LEFT KNEE - 2 VIEW COMPARISON:  None. FINDINGS: Left knee prosthesis is now seen. Air is noted in the joint space from the recent surgery. No acute bony or soft tissue abnormality is noted. IMPRESSION: Status post left knee replacement Electronically Signed   By: Inez Catalina M.D.   On: 04/12/2019 12:18    Assessment/Plan: 1 Day Post-Op   Active  Problems:   S/P TKR (total knee replacement) using cement, left   Up with therapy Plan for discharge tomorrow   Lovell Sheehan , MD 04/13/2019, 8:00 AM

## 2019-04-13 NOTE — Progress Notes (Signed)
Physical Therapy Treatment Patient Details Name: [name] MRN: [id number] DOB: [birth year] Today's Date: 04/13/2019    History of Present Illness admitted for acute hospitalization status post L TKR, WBAT (04/12/19).    PT Comments    Good L knee control and isolated movement this date, demonstrating marked improvement in ROM (0-101 degrees).  Able to complete full lap around nursing station (200') with RW, cga/min assist; intermittent cuing for L heel strike/toe off and increased cadence.  Remains highly motivated and eager to continue progression as appropriate. Will plan to initiate stairs/curb training this PM.   Follow Up Recommendations  Outpatient PT     Equipment Recommendations  Rolling walker with 5" wheels;3in1 (PT)    Recommendations for Other Services       Precautions / Restrictions Precautions Precautions: Fall Restrictions Weight Bearing Restrictions: Yes LLE Weight Bearing: Weight bearing as tolerated    Mobility  Bed Mobility Overal bed mobility: Modified Independent(indep manages R LE over edge of bed, using bilat UEs to self-assit)                Transfers Overall transfer level: Needs assistance Equipment used: Rolling walker (2 wheeled) Transfers: Sit to/from Stand Sit to Stand: Supervision         General transfer comment: min cuing for carry-over of hand placement; L LE anterior to BOS for pain control, excessive weight shift to R LE  Ambulation/Gait Ambulation/Gait assistance: Min guard Gait Distance (Feet): 200 Feet Assistive device: Rolling walker (2 wheeled)       General Gait Details: reciprocal stepping pattern with fair/good L LE stance time; nearly symmetrical step height/length with good L knee control. Min cuing for increased cadence and L heel strike/toe off throughout gait cycle   Stairs             Wheelchair Mobility    Modified Rankin (Stroke Patients Only)       Balance Overall balance  assessment: Needs assistance Sitting-balance support: Feet supported;No upper extremity supported Sitting balance-Leahy Scale: Good     Standing balance support: Bilateral upper extremity supported Standing balance-Leahy Scale: Fair                              Cognition Arousal/Alertness: Awake/alert Behavior During Therapy: WFL for tasks assessed/performed Overall Cognitive Status: Within Functional Limits for tasks assessed                                        Exercises Total Joint Exercises Goniometric ROM: L knee: 0-101 degrees Other Exercises Other Exercises: Seated LE therex, 1x10, AROM for muscular strength/ROM: ankle pumps, LAQs with end-range knee flexion, marching    General Comments        Pertinent Vitals/Pain Pain Assessment: 0-10 Pain Score: 8  Pain Location: L knee Pain Descriptors / Indicators: Aching;Grimacing;Guarding Pain Intervention(s): Limited activity within patient's tolerance;Monitored during session;Repositioned;Patient requesting pain meds-RN notified;RN gave pain meds during session    Home Living                      Prior Function            PT Goals (current goals can now be found in the care plan section) Acute Rehab PT Goals Patient Stated Goal: to do what I've got  to do PT Goal Formulation: With  patient Time For Goal Achievement: 04/27/19 Potential to Achieve Goals: Good Progress towards PT goals: Progressing toward goals    Frequency    BID      PT Plan Current plan remains appropriate    Co-evaluation              AM-PAC PT "6 Clicks" Mobility   Outcome Measure  Help needed turning from your back to your side while in a flat bed without using bedrails?: None Help needed moving from lying on your back to sitting on the side of a flat bed without using bedrails?: None Help needed moving to and from a bed to a chair (including a wheelchair)?: None Help needed standing up  from a chair using your arms (e.g., wheelchair or bedside chair)?: A Little Help needed to walk in hospital room?: A Little Help needed climbing 3-5 steps with a railing? : A Little 6 Click Score: 21    End of Session Equipment Utilized During Treatment: Gait belt Activity Tolerance: Patient tolerated treatment well Patient left: in chair;with chair alarm set;with call bell/phone within reach Nurse Communication: Mobility status PT Visit Diagnosis: Muscle weakness (generalized) (M62.81);Difficulty in walking, not elsewhere classified (R26.2);Pain Pain - Right/Left: Left Pain - part of body: Knee     Time: 0912-0939 PT Time Calculation (min) (ACUTE ONLY): 27 min  Charges:  $Gait Training: 8-22 mins $Therapeutic Exercise: 8-22 mins                     [name], PT, DPT, NCS 04/13/19, 9:54 AM [phone]

## 2019-04-14 LAB — CBC
HCT: 31.5 % — ABNORMAL LOW (ref 36.0–46.0)
Hemoglobin: 10.1 g/dL — ABNORMAL LOW (ref 12.0–15.0)
MCH: 30.4 pg (ref 26.0–34.0)
MCHC: 32.1 g/dL (ref 30.0–36.0)
MCV: 94.9 fL (ref 80.0–100.0)
Platelets: 245 10*3/uL (ref 150–400)
RBC: 3.32 MIL/uL — ABNORMAL LOW (ref 3.87–5.11)
RDW: 12.8 % (ref 11.5–15.5)
WBC: 11.6 10*3/uL — ABNORMAL HIGH (ref 4.0–10.5)
nRBC: 0 % (ref 0.0–0.2)

## 2019-04-14 MED ORDER — ASPIRIN 81 MG PO CHEW: 81.0000 mg | CHEWABLE_TABLET | Freq: Two times a day (BID) | ORAL | 1 refills | Status: DC

## 2019-04-14 MED ORDER — DOCUSATE SODIUM 100 MG PO CAPS: 100.0000 mg | ORAL_CAPSULE | Freq: Two times a day (BID) | ORAL | 0 refills | Status: DC

## 2019-04-14 MED ORDER — OXYCODONE-ACETAMINOPHEN 10-325 MG PO TABS: 1.0000 | ORAL_TABLET | ORAL | 0 refills | Status: DC | PRN

## 2019-04-14 NOTE — Progress Notes (Signed)
Discharge summary reviewed with verbal understanding. Answered all questions. Escorted to personal vehicle. 

## 2019-04-14 NOTE — TOC Transition Note (Signed)
Transition of Care [facility]) - CM/SW Discharge Note   Patient Details  Name: [name] MRN: [id number] Date of Birth: 1961/10/11  [facility] CM/SW Contact:  Linday Rhodes, Lenice Llamas Phone Number: [phone]  04/14/2019, 10:03 AM   Clinical Narrative:   Patient will D/C home today and will start outpatient PT at [facility] office in [place] on Wednesday June 17th at 12:40 pm. Patient has been notified of her outpatient PT appointment. Brad with [employer] has delivered patient's rolling walker and bedside commode. Patient has no other needs at this time. Please reconsult if future social work needs arise. CSW signing off.      Final next level of care: Home/Self Care Barriers to Discharge: Barriers Resolved   Patient Goals and CMS Choice Patient states their goals for this hospitalization and ongoing recovery are:: Pain control. CMS Medicare.gov Compare Post Acute Care list provided to:: Other (Comment Required)(Patient will go to outpatient PT.) Choice offered to / list presented to : NA  Discharge Placement                       Discharge Plan and Services In-house Referral: Clinical Social Work Discharge Planning Services: CM Consult            DME Arranged: Gilford Rile rolling, 3-N-1 DME Agency: [employer] Date DME Agency Contacted: 04/14/19 Time DME Agency Contacted: 15 Representative spoke with at DME Agency: [place] ([place]) Interventions     Readmission Risk Interventions No flowsheet data found.

## 2019-04-14 NOTE — Progress Notes (Signed)
Physical Therapy Treatment Patient Details Name: [name] MRN: [id number] DOB: Nov 10, 1960 Today's Date: 04/14/2019    History of Present Illness admitted for acute hospitalization status post L TKR, WBAT (04/12/19).    PT Comments    Patient with excellent progress towards all mobility goals, meeting/exceeding goals for bed mobility, transfers and gait.  Excellent efforts to integrate cuing and education throughout rehab course; anticipate consistent progress towards all mobility goals at discharge. Patient comfortable with current functional status and upcoming discharge; voices understanding of polar care use, mobility recommendations and HEP.  No additional questions/concerns at this time.    Follow Up Recommendations  Outpatient PT     Equipment Recommendations  Rolling walker with 5" wheels;3in1 (PT)    Recommendations for Other Services       Precautions / Restrictions Precautions Precautions: Fall Restrictions Weight Bearing Restrictions: Yes LLE Weight Bearing: Weight bearing as tolerated    Mobility  Bed Mobility Overal bed mobility: (seated in recliner beginning/end of treatment session)                Transfers Overall transfer level: Modified independent   Transfers: Sit to/from Stand           General transfer comment: instructed in progression towards symmetrical foot placement and LE WBing as pain allows to increase demand to L LE with movement transitions; good integration wiht all sit/stand efforts  Ambulation/Gait Ambulation/Gait assistance: Supervision;Modified independent (Device/Increase time) Gait Distance (Feet): 200 Feet Assistive device: Rolling walker (2 wheeled)   Gait velocity: 10' walk time, 9-10 seconds   General Gait Details: reciprocal stepping pattern with slow, but steady, cadence; good stance time/loading to L LE with improving L knee/overall gait mechanics   Stairs             Wheelchair Mobility     Modified Rankin (Stroke Patients Only)       Balance Overall balance assessment: Needs assistance   Sitting balance-Leahy Scale: Good     Standing balance support: Bilateral upper extremity supported Standing balance-Leahy Scale: Fair                              Cognition Arousal/Alertness: Awake/alert Behavior During Therapy: WFL for tasks assessed/performed Overall Cognitive Status: Within Functional Limits for tasks assessed                                        Exercises Total Joint Exercises Goniometric ROM: L knee: 0-105 degrees Other Exercises Other Exercises: Seated LE therex, 1x10: ankle pumps, LAQs Other Exercises: Sit/stand x5 with bilat UE support, close sup-emphasis on LE foot placement and symmetrical WBing    General Comments        Pertinent Vitals/Pain Pain Assessment: 0-10 Pain Score: 6  Pain Location: L knee Pain Descriptors / Indicators: Aching;Grimacing;Guarding Pain Intervention(s): Limited activity within patient's tolerance;Monitored during session;Repositioned;Patient requesting pain meds-RN notified;RN gave pain meds during session    Home Living                      Prior Function            PT Goals (current goals can now be found in the care plan section) Acute Rehab PT Goals Patient Stated Goal: to do what I've got  to do PT Goal Formulation: With patient Time For  Goal Achievement: 04/27/19 Potential to Achieve Goals: Good Progress towards PT goals: Progressing toward goals    Frequency    BID      PT Plan Current plan remains appropriate    Co-evaluation              AM-PAC PT "6 Clicks" Mobility   Outcome Measure  Help needed turning from your back to your side while in a flat bed without using bedrails?: None Help needed moving from lying on your back to sitting on the side of a flat bed without using bedrails?: None Help needed moving to and from a bed to a chair  (including a wheelchair)?: None Help needed standing up from a chair using your arms (e.g., wheelchair or bedside chair)?: None Help needed to walk in hospital room?: None Help needed climbing 3-5 steps with a railing? : None 6 Click Score: 24    End of Session Equipment Utilized During Treatment: Gait belt Activity Tolerance: Patient tolerated treatment well Patient left: in chair;with chair alarm set;with call bell/phone within reach Nurse Communication: Mobility status PT Visit Diagnosis: Muscle weakness (generalized) (M62.81);Difficulty in walking, not elsewhere classified (R26.2);Pain Pain - Right/Left: Left Pain - part of body: Knee     Time: 4356-8616 PT Time Calculation (min) (ACUTE ONLY): 27 min  Charges:  $Gait Training: 8-22 mins $Therapeutic Exercise: 8-22 mins                     Cheyenne Bordeaux H. Owens Shark, PT, DPT, NCS 04/14/19, 10:29 AM [phone]

## 2019-04-14 NOTE — Progress Notes (Signed)
  Subjective:  Patient reports pain as moderate.  Patient sitting in chair with knees bent to 90 degrees.  Objective:   VITALS:   Vitals:   04/13/19 1729 04/13/19 2154 04/14/19 0011 04/14/19 0544  BP: (!) 94/59 126/75 92/64 128/81  Pulse: 61  (!) 55   Resp:   16   Temp:   97.6 F (36.4 C)   TempSrc:   Oral   SpO2: 100%  98%   Weight:      Height:        PHYSICAL EXAM:  Neurologically intact ABD soft Neurovascular intact Sensation intact distally Intact pulses distally Dorsiflexion/Plantar flexion intact Incision: dressing C/D/I No cellulitis present Compartment soft  LABS  Results for orders placed or performed during the hospital encounter of 04/12/19 (from the past 24 hour(s))  CBC     Status: Abnormal   Collection Time: 04/14/19  6:27 AM  Result Value Ref Range   WBC 11.6 (H) 4.0 - 10.5 K/uL   RBC 3.32 (L) 3.87 - 5.11 MIL/uL   Hemoglobin 10.1 (L) 12.0 - 15.0 g/dL   HCT 31.5 (L) 36.0 - 46.0 %   MCV 94.9 80.0 - 100.0 fL   MCH 30.4 26.0 - 34.0 pg   MCHC 32.1 30.0 - 36.0 g/dL   RDW 12.8 11.5 - 15.5 %   Platelets 245 150 - 400 K/uL   nRBC 0.0 0.0 - 0.2 %    Dg Knee Left Port  Result Date: 04/12/2019 CLINICAL DATA:  Status post left knee replacement EXAM: PORTABLE LEFT KNEE - 2 VIEW COMPARISON:  None. FINDINGS: Left knee prosthesis is now seen. Air is noted in the joint space from the recent surgery. No acute bony or soft tissue abnormality is noted. IMPRESSION: Status post left knee replacement Electronically Signed   By: Inez Catalina M.D.   On: 04/12/2019 12:18    Assessment/Plan: 2 Days Post-Op   Active Problems:   S/P TKR (total knee replacement) using cement, left   Advance diet Up with therapy Discharge home today after therapy Out patient PT set up to start Wednesday Follow up in 2 weeks for staple removal  Carlynn Spry , PA-C 04/14/2019, 8:21 AM

## 2019-04-14 NOTE — Discharge Instructions (Signed)
Continue weight bear as tolerated on the left lower extremity.    Elevate the left lower extremity whenever possible and continue the polar care while elevating the extremity. Patient may shower. No bath or submerging the wound.    Take aspirin as directed for blood clot prevention.  Continue to work on knee range of motion exercises at home as instructed by physical therapy. Continue to use a walker for assistance with ambulation until cleared by physical therapy.  Call [phone] with any questions, such as fever > 101.5 degrees, drainage from the wound or shortness of breath. 

## 2019-04-14 NOTE — Discharge Summary (Signed)
Physician Discharge Summary  Patient ID: [name] MRN: [id number] DOB/AGE: November 23, 1960 58 y.o.  Admit date: 04/12/2019 Discharge date: 04/14/2019  Admission Diagnoses:  M17.12 Unilateral primary osteoarthritis, left knee <principal problem not specified>  Discharge Diagnoses:  M17.12 Unilateral primary osteoarthritis, left knee Active Problems:   S/P TKR (total knee replacement) using cement, left   Past Medical History:  Diagnosis Date  . Arthritis    bilateral knees  . Contact dermatitis and other eczema, due to unspecified cause   . COPD (chronic obstructive pulmonary disease) ([place])   . Depressive disorder   . Dysmetabolic syndrome X   . GERD (gastroesophageal reflux disease)   . Hyperlipidemia   . Hypertension   . IBS (irritable bowel syndrome)   . Insomnia   . Lumbago   . Nonspecific abnormal electrocardiogram (ECG) (EKG)   . Other ovarian failure(256.39)   . Postmenopausal atrophic vaginitis   . Symptomatic menopausal or female climacteric states   . Unspecified vitamin D deficiency     Surgeries: Procedure(s): TOTAL KNEE ARTHROPLASTY on 04/12/2019   Consultants (if any):   Discharged Condition: Improved  Hospital Course: [name] is an 57 y.o. female who was admitted 04/12/2019 with a diagnosis of  M17.12 Unilateral primary osteoarthritis, left knee <principal problem not specified> and went to the operating room on 04/12/2019 and underwent the above named procedures.    She was given perioperative antibiotics:  Anti-infectives (From admission, onward)   Start     Dose/Rate Route Frequency Ordered Stop   04/12/19 1400  ceFAZolin (ANCEF) IVPB 2g/100 mL premix     2 g 200 mL/hr over 30 Minutes Intravenous Every 6 hours 04/12/19 1147 04/12/19 2107   04/12/19 0922  50,000 units bacitracin in 0.9% normal saline 250 mL irrigation  Status:  Discontinued       As needed 04/12/19 0922 04/12/19 1103   04/12/19 0627  ceFAZolin (ANCEF) 2-4 GM/100ML-% IVPB     Note to Pharmacy: [place], Candace   : cabinet override      04/12/19 0627 04/12/19 1829   04/12/19 0600  ceFAZolin (ANCEF) IVPB 2g/100 mL premix     2 g 200 mL/hr over 30 Minutes Intravenous On call to O.R. 04/12/19 0113 04/12/19 0805    .  She was given sequential compression devices, early ambulation, and aspirin for DVT prophylaxis.  She benefited maximally from the hospital stay and there were no complications.    Recent vital signs:  Vitals:   04/14/19 0011 04/14/19 0544  BP: 92/64 128/81  Pulse: (!) 55   Resp: 16   Temp: 97.6 F (36.4 C)   SpO2: 98%     Recent laboratory studies:  Lab Results  Component Value Date   HGB 10.1 (L) 04/14/2019   HGB 10.6 (L) 04/13/2019   HGB 12.6 04/12/2019   Lab Results  Component Value Date   WBC 11.6 (H) 04/14/2019   PLT 245 04/14/2019   Lab Results  Component Value Date   INR 1.1 04/11/2019   Lab Results  Component Value Date   NA 140 04/13/2019   K 3.5 04/13/2019   CL 105 04/13/2019   CO2 24 04/13/2019   BUN 12 04/13/2019   CREATININE 0.71 04/13/2019   GLUCOSE 153 (H) 04/13/2019    Discharge Medications:   Allergies as of 04/14/2019   No Known Allergies     Medication List    STOP taking these medications   acetaminophen 500 MG tablet Commonly known as: TYLENOL  gabapentin 300 MG capsule Commonly known as: NEURONTIN   Oxycodone HCl 20 MG Tabs     TAKE these medications   amitriptyline 10 MG tablet Commonly known as: ELAVIL Take 1 tablet (10 mg total) by mouth at bedtime. What changed:   when to take this  reasons to take this   amLODipine 5 MG tablet Commonly known as: NORVASC Take 1 tablet (5 mg total) by mouth daily.   aspirin 81 MG chewable tablet Chew 1 tablet (81 mg total) by mouth 2 (two) times daily.   atorvastatin 40 MG tablet Commonly known as: LIPITOR Take 1 tablet (40 mg total) by mouth daily.   Chantix Continuing Month Pak 1 MG tablet Generic drug: varenicline TAKE ONE  TABLET BY MOUTH TWICE DAILY What changed: how much to take   cholecalciferol 1000 units tablet Commonly known as: VITAMIN D Take 1,000 Units by mouth daily.   docusate sodium 100 MG capsule Commonly known as: COLACE Take 1 capsule (100 mg total) by mouth 2 (two) times daily.   lidocaine 5 % Commonly known as: Lidoderm Place 1 patch onto the skin daily. Remove & Discard patch within 12 hours or as directed by MD What changed:   when to take this  reasons to take this   losartan-hydrochlorothiazide 100-25 MG tablet Commonly known as: HYZAAR Take 1 tablet by mouth daily.   Magnesium Oxide 250 MG Tabs Take 1 tablet (250 mg total) by mouth daily for 7 days.   meloxicam 15 MG tablet Commonly known as: MOBIC Take 1 tablet (15 mg total) by mouth daily.   metFORMIN 500 MG tablet Commonly known as: GLUCOPHAGE Take 1 tablet (500 mg total) by mouth daily with breakfast.   omeprazole 20 MG capsule Commonly known as: PRILOSEC Take 1 capsule (20 mg total) by mouth daily.   oxyCODONE-acetaminophen 10-325 MG tablet Commonly known as: Percocet Take 1 tablet by mouth every 4 (four) hours as needed for pain.   potassium chloride 10 MEQ tablet Commonly known as: K-DUR Take 1 tablet (10 mEq total) by mouth 2 (two) times daily for 3 days.   Pristiq 50 MG 24 hr tablet Generic drug: desvenlafaxine Take 1 tablet (50 mg total) by mouth daily.   QUEtiapine 25 MG tablet Commonly known as: SEROQUEL Take 1 tablet (25 mg total) by mouth at bedtime.   tiZANidine 4 MG tablet Commonly known as: ZANAFLEX Take 4 mg by mouth every 12 (twelve) hours.            Durable Medical Equipment  (From admission, onward)         Start     Ordered   04/13/19 1625  For home use only DME Walker rolling  Once    Question:  Patient needs a walker to treat with the following condition  Answer:  Weakness   04/13/19 1624   04/13/19 1624  For home use only DME Bedside commode  Once    Question:   Patient needs a bedside commode to treat with the following condition  Answer:  Weakness   04/13/19 1624          Diagnostic Studies: Dg Knee Left Port  Result Date: 04/12/2019 CLINICAL DATA:  Status post left knee replacement EXAM: PORTABLE LEFT KNEE - 2 VIEW COMPARISON:  None. FINDINGS: Left knee prosthesis is now seen. Air is noted in the joint space from the recent surgery. No acute bony or soft tissue abnormality is noted. IMPRESSION: Status post left knee replacement Electronically  Signed   By: Inez Catalina M.D.   On: 04/12/2019 12:18    Disposition: Discharge disposition: 01-Home or Self Care           Signed: Carlynn Spry ,PA-C 04/14/2019, 8:35 AM

## 2019-04-19 DIAGNOSIS — Z96652 Presence of left artificial knee joint: Secondary | ICD-10-CM | POA: Diagnosis not present

## 2019-04-19 DIAGNOSIS — M25562 Pain in left knee: Secondary | ICD-10-CM | POA: Diagnosis not present

## 2019-04-21 DIAGNOSIS — M25562 Pain in left knee: Secondary | ICD-10-CM | POA: Diagnosis not present

## 2019-04-21 DIAGNOSIS — Z96652 Presence of left artificial knee joint: Secondary | ICD-10-CM | POA: Diagnosis not present

## 2019-04-26 DIAGNOSIS — M25562 Pain in left knee: Secondary | ICD-10-CM | POA: Diagnosis not present

## 2019-04-26 DIAGNOSIS — Z96652 Presence of left artificial knee joint: Secondary | ICD-10-CM | POA: Diagnosis not present

## 2019-04-28 DIAGNOSIS — Z96652 Presence of left artificial knee joint: Secondary | ICD-10-CM | POA: Diagnosis not present

## 2019-04-28 DIAGNOSIS — M25562 Pain in left knee: Secondary | ICD-10-CM | POA: Diagnosis not present

## 2019-05-02 DIAGNOSIS — M25562 Pain in left knee: Secondary | ICD-10-CM | POA: Diagnosis not present

## 2019-05-02 DIAGNOSIS — Z96652 Presence of left artificial knee joint: Secondary | ICD-10-CM | POA: Diagnosis not present

## 2019-05-04 DIAGNOSIS — Z96652 Presence of left artificial knee joint: Secondary | ICD-10-CM | POA: Diagnosis not present

## 2019-05-04 DIAGNOSIS — M25562 Pain in left knee: Secondary | ICD-10-CM | POA: Diagnosis not present

## 2019-05-06 ENCOUNTER — Other Ambulatory Visit: Payer: Self-pay | Admitting: Family Medicine

## 2019-05-06 DIAGNOSIS — I700 Atherosclerosis of aorta: Secondary | ICD-10-CM

## 2019-05-09 DIAGNOSIS — M545 Low back pain: Secondary | ICD-10-CM | POA: Diagnosis not present

## 2019-05-09 DIAGNOSIS — M25561 Pain in right knee: Secondary | ICD-10-CM | POA: Diagnosis not present

## 2019-05-09 DIAGNOSIS — M25551 Pain in right hip: Secondary | ICD-10-CM | POA: Diagnosis not present

## 2019-05-09 DIAGNOSIS — M25511 Pain in right shoulder: Secondary | ICD-10-CM | POA: Diagnosis not present

## 2019-05-09 DIAGNOSIS — M25512 Pain in left shoulder: Secondary | ICD-10-CM | POA: Diagnosis not present

## 2019-05-09 DIAGNOSIS — M79661 Pain in right lower leg: Secondary | ICD-10-CM | POA: Diagnosis not present

## 2019-05-09 DIAGNOSIS — Z96652 Presence of left artificial knee joint: Secondary | ICD-10-CM | POA: Diagnosis not present

## 2019-05-09 DIAGNOSIS — M5432 Sciatica, left side: Secondary | ICD-10-CM | POA: Diagnosis not present

## 2019-05-09 DIAGNOSIS — M79662 Pain in left lower leg: Secondary | ICD-10-CM | POA: Diagnosis not present

## 2019-05-09 DIAGNOSIS — M542 Cervicalgia: Secondary | ICD-10-CM | POA: Diagnosis not present

## 2019-05-09 DIAGNOSIS — M25562 Pain in left knee: Secondary | ICD-10-CM | POA: Diagnosis not present

## 2019-05-09 DIAGNOSIS — M25552 Pain in left hip: Secondary | ICD-10-CM | POA: Diagnosis not present

## 2019-05-09 DIAGNOSIS — M5431 Sciatica, right side: Secondary | ICD-10-CM | POA: Diagnosis not present

## 2019-05-12 ENCOUNTER — Other Ambulatory Visit: Payer: Self-pay | Admitting: Family Medicine

## 2019-05-12 DIAGNOSIS — Z716 Tobacco abuse counseling: Secondary | ICD-10-CM

## 2019-05-12 NOTE — Telephone Encounter (Signed)
Refill request for general medication Chantix to [place] office visit 11/23/2018   Follow up on 05/24/2019

## 2019-05-18 DIAGNOSIS — Z96652 Presence of left artificial knee joint: Secondary | ICD-10-CM | POA: Diagnosis not present

## 2019-05-18 DIAGNOSIS — M25562 Pain in left knee: Secondary | ICD-10-CM | POA: Diagnosis not present

## 2019-05-20 ENCOUNTER — Other Ambulatory Visit: Payer: Self-pay | Admitting: Family Medicine

## 2019-05-20 DIAGNOSIS — F339 Major depressive disorder, recurrent, unspecified: Secondary | ICD-10-CM

## 2019-05-23 DIAGNOSIS — M25562 Pain in left knee: Secondary | ICD-10-CM | POA: Diagnosis not present

## 2019-05-23 DIAGNOSIS — Z96652 Presence of left artificial knee joint: Secondary | ICD-10-CM | POA: Diagnosis not present

## 2019-05-24 ENCOUNTER — Ambulatory Visit: Payer: Medicare Other | Admitting: Family Medicine

## 2019-05-25 DIAGNOSIS — Z96652 Presence of left artificial knee joint: Secondary | ICD-10-CM | POA: Diagnosis not present

## 2019-05-25 DIAGNOSIS — M25562 Pain in left knee: Secondary | ICD-10-CM | POA: Diagnosis not present

## 2019-06-05 DIAGNOSIS — M1711 Unilateral primary osteoarthritis, right knee: Secondary | ICD-10-CM | POA: Diagnosis not present

## 2019-06-05 DIAGNOSIS — Z96652 Presence of left artificial knee joint: Secondary | ICD-10-CM | POA: Diagnosis not present

## 2019-06-06 DIAGNOSIS — Z96652 Presence of left artificial knee joint: Secondary | ICD-10-CM | POA: Diagnosis not present

## 2019-06-06 DIAGNOSIS — M25562 Pain in left knee: Secondary | ICD-10-CM | POA: Diagnosis not present

## 2019-06-08 DIAGNOSIS — M25562 Pain in left knee: Secondary | ICD-10-CM | POA: Diagnosis not present

## 2019-06-08 DIAGNOSIS — Z96652 Presence of left artificial knee joint: Secondary | ICD-10-CM | POA: Diagnosis not present

## 2019-06-22 ENCOUNTER — Other Ambulatory Visit: Payer: Self-pay | Admitting: Family Medicine

## 2019-06-22 DIAGNOSIS — I10 Essential (primary) hypertension: Secondary | ICD-10-CM

## 2019-06-22 NOTE — Telephone Encounter (Signed)
Requested Prescriptions  Pending Prescriptions Disp Refills  . losartan-hydrochlorothiazide (HYZAAR) 100-25 MG tablet [Pharmacy Med Name: losartan 100 mg-hydrochlorothiazide 25 mg tablet] 30 tablet 5    Sig: TAKE ONE TABLET BY MOUTH ONCE DAILY     Cardiovascular: ARB + Diuretic Combos Failed - 06/22/2019  8:00 AM      Failed - Ca in normal range and within 180 days    Calcium  Date Value Ref Range Status  04/13/2019 8.8 (L) 8.9 - 10.3 mg/dL Final         Failed - Last BP in normal range    BP Readings from Last 1 Encounters:  04/14/19 (!) 151/86         Passed - K in normal range and within 180 days    Potassium  Date Value Ref Range Status  04/13/2019 3.5 3.5 - 5.1 mmol/L Final         Passed - Na in normal range and within 180 days    Sodium  Date Value Ref Range Status  04/13/2019 140 135 - 145 mmol/L Final         Passed - Cr in normal range and within 180 days    Creat  Date Value Ref Range Status  04/06/2019 0.83 0.50 - 1.05 mg/dL Final    Comment:    For patients >15 years of age, the reference limit for Creatinine is approximately 13% higher for people identified as African-American. .    Creatinine, Ser  Date Value Ref Range Status  04/13/2019 0.71 0.44 - 1.00 mg/dL Final         Passed - Patient is not pregnant      Passed - Valid encounter within last 6 months    Recent Outpatient Visits          2 months ago Pre-op evaluation   [place], FNP   7 months ago Major depression, recurrent, chronic [facility])   [facility] Steele Sizer, MD   10 months ago Carpal tunnel syndrome on both sides   [facility] Steele Sizer, MD   1 year ago Acute peptic ulcer of stomach   [facility] Steele Sizer, MD   1 year ago COPD exacerbation [facility])   [facility] Steele Sizer, MD      Future Appointments            In 1 month [place]

## 2019-06-30 ENCOUNTER — Other Ambulatory Visit: Payer: Self-pay

## 2019-06-30 DIAGNOSIS — R6889 Other general symptoms and signs: Secondary | ICD-10-CM | POA: Diagnosis not present

## 2019-06-30 DIAGNOSIS — Z20822 Contact with and (suspected) exposure to covid-19: Secondary | ICD-10-CM

## 2019-07-01 LAB — NOVEL CORONAVIRUS, NAA: SARS-CoV-2, NAA: NOT DETECTED

## 2019-07-08 ENCOUNTER — Other Ambulatory Visit: Payer: Self-pay | Admitting: Family Medicine

## 2019-07-08 DIAGNOSIS — F5104 Psychophysiologic insomnia: Secondary | ICD-10-CM

## 2019-07-08 DIAGNOSIS — F339 Major depressive disorder, recurrent, unspecified: Secondary | ICD-10-CM

## 2019-07-08 NOTE — Telephone Encounter (Signed)
Requested medication (s) are due for refill today:  yes  Requested medication (s) are on the active medication list:  Yes  Future visit scheduled:  Yes; AWV 08/01/19  Last Refill: 11/23/2018; #30; refill x 5  Requested Prescriptions  Pending Prescriptions Disp Refills   QUEtiapine (SEROQUEL) 25 MG tablet [Pharmacy Med Name: quetiapine 25 mg tablet] 30 tablet 5    Sig: TAKE ONE TABLET BY MOUTH AT BEDTIME     Not Delegated - Psychiatry:  Antipsychotics - Second Generation (Atypical) - quetiapine Failed - 07/08/2019  8:01 AM      Failed - This refill cannot be delegated      Failed - Last BP in normal range    BP Readings from Last 1 Encounters:  04/14/19 (!) 151/86         Passed - ALT in normal range and within 180 days    ALT  Date Value Ref Range Status  04/06/2019 21 6 - 29 U/L Final         Passed - AST in normal range and within 180 days    AST  Date Value Ref Range Status  04/06/2019 20 10 - 35 U/L Final         Passed - Valid encounter within last 6 months    Recent Outpatient Visits          3 months ago Pre-op evaluation   [place], [place], FNP   7 months ago Major depression, recurrent, chronic [facility])   [facility] Steele Sizer, MD   11 months ago Carpal tunnel syndrome on both sides   [facility] Steele Sizer, MD   1 year ago Acute peptic ulcer of stomach   [facility] Steele Sizer, MD   1 year ago COPD exacerbation [facility])   [facility] Steele Sizer, MD      Future Appointments            In 3 weeks [place] - Completed PHQ-2 or PHQ-9 in the last 360 days.

## 2019-07-11 NOTE — Telephone Encounter (Signed)
LVM to sch appt per sowles

## 2019-07-12 NOTE — Telephone Encounter (Signed)
LVM FOR THE 2ND TIME. Per sowles needs appt before will refill rx

## 2019-07-13 ENCOUNTER — Encounter: Payer: Self-pay | Admitting: Family Medicine

## 2019-07-13 ENCOUNTER — Ambulatory Visit (INDEPENDENT_AMBULATORY_CARE_PROVIDER_SITE_OTHER): Payer: Medicare Other | Admitting: Family Medicine

## 2019-07-13 ENCOUNTER — Other Ambulatory Visit: Payer: Self-pay

## 2019-07-13 VITALS — Wt 195.0 lb

## 2019-07-13 DIAGNOSIS — J449 Chronic obstructive pulmonary disease, unspecified: Secondary | ICD-10-CM | POA: Diagnosis not present

## 2019-07-13 DIAGNOSIS — F5104 Psychophysiologic insomnia: Secondary | ICD-10-CM | POA: Diagnosis not present

## 2019-07-13 DIAGNOSIS — I700 Atherosclerosis of aorta: Secondary | ICD-10-CM | POA: Diagnosis not present

## 2019-07-13 DIAGNOSIS — F339 Major depressive disorder, recurrent, unspecified: Secondary | ICD-10-CM

## 2019-07-13 DIAGNOSIS — D649 Anemia, unspecified: Secondary | ICD-10-CM | POA: Diagnosis not present

## 2019-07-13 DIAGNOSIS — I10 Essential (primary) hypertension: Secondary | ICD-10-CM

## 2019-07-13 DIAGNOSIS — R790 Abnormal level of blood mineral: Secondary | ICD-10-CM | POA: Diagnosis not present

## 2019-07-13 DIAGNOSIS — K582 Mixed irritable bowel syndrome: Secondary | ICD-10-CM

## 2019-07-13 DIAGNOSIS — E8881 Metabolic syndrome: Secondary | ICD-10-CM | POA: Diagnosis not present

## 2019-07-13 DIAGNOSIS — R739 Hyperglycemia, unspecified: Secondary | ICD-10-CM | POA: Diagnosis not present

## 2019-07-13 MED ORDER — AMITRIPTYLINE HCL 10 MG PO TABS: 10.0000 mg | ORAL_TABLET | Freq: Every day | ORAL | 5 refills | Status: DC

## 2019-07-13 MED ORDER — METFORMIN HCL 500 MG PO TABS: 500.0000 mg | ORAL_TABLET | Freq: Every day | ORAL | 5 refills | Status: DC

## 2019-07-13 MED ORDER — ATORVASTATIN CALCIUM 40 MG PO TABS: 40.0000 mg | ORAL_TABLET | Freq: Every day | ORAL | 5 refills | Status: DC

## 2019-07-13 MED ORDER — QUETIAPINE FUMARATE 25 MG PO TABS: 25.0000 mg | ORAL_TABLET | Freq: Every day | ORAL | 5 refills | Status: DC

## 2019-07-13 NOTE — Progress Notes (Signed)
Name: [name]   MRN: [id number]    DOB: 1961/08/10   Date:07/13/2019       Progress Note  Subjective  Chief Complaint  Chief Complaint  Patient presents with  . Hypertension  . Hyperglycemia  . Medication Refill  . Depression    I connected with  [name]  on 07/13/19 at  8:20 AM EDT by a telephone encounter and verified that I am speaking with the correct person using two identifiers.  I discussed the limitations of evaluation and management by telemedicine and the availability of in person appointments. The patient expressed understanding and agreed to proceed. Staff also discussed with the patient that there may be a patient responsible charge related to this service. Patient Location: at home  Provider Location: [facility]   HPI  OA both knees: she has a TKA of left knee 04/2019 by Dr. Harlow Mares. She is doing well, she finished PT and is able to walk without a cane but occasionally still uses a cane  Metabolic syndrome:weight has gone down 12 lbs in the past 6 months No longer has diarrhea from Metformin, but only taking one pill daily.She denies polyphagia, polydipsia or polyuria. Continue life style modification.Last hgbA1C was 5.8%, recheck labs next week   HTN:bp not checked on a regular basis at home, but states it was 145 this am, advised to check it more often to see if we need to adjust medications, it was a virtual visit, no longer has dizziness, denies palpitation and shortness of breathShe states she has been forgetting to take norvasc at night advised to take it all at once in am's  Depression Major:long history of depression, got worse when mother died in 12-31-16, but is doing better now She states still has occasional down days but negative phq 9 except for sleep difficulties. She is complaint with medication and denies side effects   COPD:She quit smoking 04/2019 She denies any wheezing, cough or SOB at this time  IBS: doing  well at this time, pain under control, she has diarrhea and constipation type Currently having loose stools but no pain today   Anemia Unspecified: HCT dropped during hospital stay and we will ask her to return to have labs done. She denies pica, no sob or palpitation   Low Magnesium: we will recheck labs   Patient Active Problem List   Diagnosis Date Noted  . S/P TKR (total knee replacement) using cement, left 04/12/2019  . Overweight (BMI 25.0-29.9) 04/06/2019  . Peptic ulcer   . Benign neoplasm of ascending colon   . Diverticulosis of large intestine without diverticulitis   . Simple chronic bronchitis ([place]) 08/03/2018  . Pancreatic lesion 04/21/2018  . Abdominal aortic atherosclerosis ([place]) 04/21/2018  . Acute peptic ulcer of stomach   . Gastric polyp   . Abdominal pain, epigastric   . Trigger thumb of right hand 02/25/2018  . Carpal tunnel syndrome on both sides 10/21/2017  . CRP elevated 08/09/2017  . Elevated C-reactive protein 07/28/2017  . Osteoarthritis of both knees 02/19/2016  . COPD, mild ([place]) 05/08/2015  . Vitamin D deficiency 05/07/2015  . Metabolic syndrome 98/33/8250  . Eczema 05/07/2015  . Chronic insomnia 05/07/2015  . Hypertension, benign 05/07/2015  . Dyslipidemia 05/07/2015  . Hyperglycemia 05/07/2015  . Chronic low back pain 05/07/2015  . IBS (irritable bowel syndrome) 05/07/2015  . Gastroesophageal reflux disease without esophagitis 05/07/2015  . Major depression, chronic ([place]) 05/07/2015  . Menopausal syndrome (hot flashes)  05/07/2015  . History of postmenopausal bleeding 01/14/2015    Past Surgical History:  Procedure Laterality Date  . BACK SURGERY    . BREAST BIOPSY Left    neg  . BREAST LUMPECTOMY Left   . CARPAL TUNNEL RELEASE Left 08/15/2018   Procedure: CARPAL TUNNEL RELEASE;  Surgeon: Earnestine Leys, MD;  Location: [facility];  Service: Orthopedics;  Laterality: Left;  . CERVICAL DISCECTOMY     x 2; metal plate  . CHOLECYSTECTOMY     . COLONOSCOPY WITH PROPOFOL N/A 08/08/2018   Procedure: COLONOSCOPY WITH PROPOFOL;  Surgeon: Virgel Manifold, MD;  Location: [facility];  Service: Endoscopy;  Laterality: N/A;  . DILATATION & CURETTAGE/HYSTEROSCOPY WITH MYOSURE N/A 03/21/2015   Procedure: DILATATION & CURETTAGE/HYSTEROSCOPY;  Surgeon: Aletha Halim, MD;  Location: [facility];  Service: Gynecology;  Laterality: N/A;  . DILATION AND CURETTAGE OF UTERUS    . ENDOMETRIAL ABLATION    . ESOPHAGOGASTRODUODENOSCOPY (EGD) WITH PROPOFOL N/A 04/13/2018   Procedure: ESOPHAGOGASTRODUODENOSCOPY (EGD) WITH PROPOFOL;  Surgeon: Virgel Manifold, MD;  Location: [facility];  Service: Endoscopy;  Laterality: N/A;  . ESOPHAGOGASTRODUODENOSCOPY (EGD) WITH PROPOFOL N/A 08/08/2018   Procedure: ESOPHAGOGASTRODUODENOSCOPY (EGD) WITH PROPOFOL;  Surgeon: Virgel Manifold, MD;  Location: [facility];  Service: Endoscopy;  Laterality: N/A;  . SPINAL FUSION     lumbar x2  . TONSILLECTOMY    . TOTAL KNEE ARTHROPLASTY Left 04/12/2019   Procedure: TOTAL KNEE ARTHROPLASTY;  Surgeon: Lovell Sheehan, MD;  Location: [facility];  Service: Orthopedics;  Laterality: Left;  . TUBAL LIGATION      Family History  Problem Relation Age of Onset  . Heart disease Mother   . Thyroid cancer Mother   . Depression Daughter   . Asthma Daughter     Social History   Socioeconomic History  . Marital status: Legally Separated    Spouse name: Not on file  . Number of children: 4  . Years of education: Not on file  . Highest education level: 12th grade  Occupational History  . Occupation: disabled    Comment: chronic back pain   Social Needs  . Financial resource strain: Not hard at all  . Food insecurity    Worry: Never true    Inability: Never true  . Transportation needs    Medical: No    Non-medical: No  Tobacco Use  . Smoking status: Former Smoker    Packs/day: 0.25    Years: 20.00    Pack years: 5.00    Types: Cigarettes    Start  date: 07/26/2002    Quit date: 04/06/2019    Years since quitting: 0.2  . Smokeless tobacco: Never Used  . Tobacco comment: Quit Date 04/06/2019  Substance and Sexual Activity  . Alcohol use: No    Alcohol/week: 0.0 standard drinks  . Drug use: No  . Sexual activity: Yes    Partners: Male    Birth control/protection: Other-see comments    Comment: Ablation  Lifestyle  . Physical activity    Days per week: 0 days    Minutes per session: 0 min  . Stress: Not at all  Relationships  . Social Herbalist on phone: Patient refused    Gets together: Patient refused    Attends religious service: Patient refused    Active member of club or organization: Patient refused    Attends meetings of clubs or organizations: Patient refused    Relationship status: Separated  . Intimate  partner violence    Fear of current or ex partner: No    Emotionally abused: No    Physically abused: No    Forced sexual activity: No  Other Topics Concern  . Not on file  Social History Narrative  . Not on file     Current Outpatient Medications:  .  amitriptyline (ELAVIL) 10 MG tablet, Take 1 tablet (10 mg total) by mouth at bedtime. (Patient taking differently: Take 10 mg by mouth at bedtime as needed for sleep. ), Disp: 30 tablet, Rfl: 5 .  amLODipine (NORVASC) 5 MG tablet, Take 1 tablet (5 mg total) by mouth daily., Disp: 90 tablet, Rfl: 3 .  aspirin 81 MG chewable tablet, Chew 1 tablet (81 mg total) by mouth 2 (two) times daily., Disp: 60 tablet, Rfl: 1 .  atorvastatin (LIPITOR) 40 MG tablet, TAKE ONE TABLET BY MOUTH EVERY DAY, Disp: 90 tablet, Rfl: 0 .  cholecalciferol (VITAMIN D) 1000 units tablet, Take 1,000 Units by mouth daily., Disp: , Rfl:  .  docusate sodium (COLACE) 100 MG capsule, Take 1 capsule (100 mg total) by mouth 2 (two) times daily., Disp: 20 capsule, Rfl: 0 .  gabapentin (NEURONTIN) 300 MG capsule, TAKE ONE CAPSULE BY MOUTH EVERY TWELVE HOURS, Disp: , Rfl:  .  lidocaine  (LIDODERM) 5 %, Place 1 patch onto the skin daily. Remove & Discard patch within 12 hours or as directed by MD (Patient taking differently: Place 1 patch onto the skin daily as needed (pain). Remove & Discard patch within 12 hours or as directed by MD), Disp: 30 patch, Rfl: 0 .  losartan-hydrochlorothiazide (HYZAAR) 100-25 MG tablet, TAKE ONE TABLET BY MOUTH ONCE DAILY, Disp: 30 tablet, Rfl: 5 .  meloxicam (MOBIC) 15 MG tablet, Take 1 tablet (15 mg total) by mouth daily., Disp: 30 tablet, Rfl: 0 .  metFORMIN (GLUCOPHAGE) 500 MG tablet, Take 1 tablet (500 mg total) by mouth daily with breakfast., Disp: 30 tablet, Rfl: 5 .  NARCAN 4 MG/0.1ML LIQD nasal spray kit, OPIOD EMERGENCY 1 SPRAY IN 1 NOSTRIL 1 TIME, MAY REPEAT IN OTHER NOSTRIL EVERY 2 - 3 MINS UNTIL RESPONSIVE /EMS ARRIVES, Disp: , Rfl:  .  Oxycodone HCl 20 MG TABS, Take 20 mg by mouth every 4 (four) hours., Disp: , Rfl:  .  QUEtiapine (SEROQUEL) 25 MG tablet, Take 1 tablet (25 mg total) by mouth at bedtime., Disp: 30 tablet, Rfl: 5 .  tiZANidine (ZANAFLEX) 4 MG tablet, Take 4 mg by mouth every 12 (twelve) hours. , Disp: , Rfl:  .  desvenlafaxine (PRISTIQ) 50 MG 24 hr tablet, TAKE ONE TABLET BY MOUTH once daily, Disp: 30 tablet, Rfl: 5 .  omeprazole (PRILOSEC) 20 MG capsule, Take 1 capsule (20 mg total) by mouth daily. (Patient not taking: Reported on 04/07/2019), Disp: 30 capsule, Rfl: 5 .  varenicline (CHANTIX CONTINUING MONTH PAK) 1 MG tablet, Take 1 tablet (1 mg total) by mouth 2 (two) times daily. (Patient not taking: Reported on 07/13/2019), Disp: 60 tablet, Rfl: 1  No Known Allergies  I personally reviewed active problem list, medication list, allergies, family history, social history, health maintenance with the patient/caregiver today.   ROS  Ten systems reviewed and is negative except as mentioned in HPI   Objective  Virtual encounter, vitals not obtained.  There is no height or weight on file to calculate BMI.  Physical Exam   Awake, alert and oriented   PHQ2/9: Depression screen [facility] 2/9 07/13/2019 04/06/2019 11/23/2018 08/03/2018 07/28/2018  Decreased Interest 0 0 0 0 0  Down, Depressed, Hopeless 0 0 1 0 0  PHQ - 2 Score 0 0 1 0 0  Altered sleeping 3 0 1 0 0  Tired, decreased energy 1 0 0 1 0  Change in appetite 0 0 0 1 0  Feeling bad or failure about yourself  0 0 0 0 0  Trouble concentrating 0 0 0 0 0  Moving slowly or fidgety/restless 0 0 0 0 0  Suicidal thoughts 0 0 0 0 0  PHQ-9 Score 4 0 2 2 0  Difficult doing work/chores Not difficult at all Not difficult at all Not difficult at all Not difficult at all Not difficult at all  Some recent data might be hidden   PHQ-2/9 Result is negative.    Fall Risk: Fall Risk  07/13/2019 04/06/2019 11/23/2018 07/28/2018 04/21/2018  Falls in the past year? 1 0 0 No No  Number falls in past yr: 0 0 - - -  Injury with Fall? 0 0 - - -  Risk for fall due to : - - - Impaired vision;Impaired balance/gait -  Risk for fall due to: Comment - - - wears eyeglasses; joint pain and back pain -  Follow up - Falls evaluation completed - - -     Assessment & Plan  1. Chronic insomnia  - QUEtiapine (SEROQUEL) 25 MG tablet; Take 1 tablet (25 mg total) by mouth at bedtime.  Dispense: 30 tablet; Refill: 5  2. Major depression, recurrent, chronic (HCC)  - QUEtiapine (SEROQUEL) 25 MG tablet; Take 1 tablet (25 mg total) by mouth at bedtime.  Dispense: 30 tablet; Refill: 5  3. Irritable bowel syndrome with both constipation and diarrhea  - amitriptyline (ELAVIL) 10 MG tablet; Take 1 tablet (10 mg total) by mouth daily. For IBS  Dispense: 30 tablet; Refill: 5  4. Abdominal aortic atherosclerosis (HCC)  - atorvastatin (LIPITOR) 40 MG tablet; Take 1 tablet (40 mg total) by mouth daily.  Dispense: 30 tablet; Refill: 5  5. Insulin resistance  - metFORMIN (GLUCOPHAGE) 500 MG tablet; Take 1 tablet (500 mg total) by mouth daily with breakfast.  Dispense: 30 tablet; Refill: 5 - Hemoglobin  A1c  6. Metabolic syndrome  - metFORMIN (GLUCOPHAGE) 500 MG tablet; Take 1 tablet (500 mg total) by mouth daily with breakfast.  Dispense: 30 tablet; Refill: 5 - Hemoglobin A1c  7. Anemia, unspecified type  - CBC with Differential/Platelet - Iron, TIBC and Ferritin Panel  8. Hypertension, benign  - COMPLETE METABOLIC PANEL WITH GFR  9. COPD, mild ([place])   10. Low magnesium levels  - COMPLETE METABOLIC PANEL WITH GFR - Magnesium  11. Hyperglycemia  - Hemoglobin A1c  I discussed the assessment and treatment plan with the patient. The patient was provided an opportunity to ask questions and all were answered. The patient agreed with the plan and demonstrated an understanding of the instructions.  The patient was advised to call back or seek an in-person evaluation if the symptoms worsen or if the condition fails to improve as anticipated.  I provided 25 minutes of non-face-to-face time during this encounter.

## 2019-07-14 ENCOUNTER — Ambulatory Visit: Payer: Medicare Other

## 2019-07-14 DIAGNOSIS — M25552 Pain in left hip: Secondary | ICD-10-CM | POA: Diagnosis not present

## 2019-07-14 DIAGNOSIS — G894 Chronic pain syndrome: Secondary | ICD-10-CM | POA: Diagnosis not present

## 2019-07-14 DIAGNOSIS — M542 Cervicalgia: Secondary | ICD-10-CM | POA: Diagnosis not present

## 2019-07-14 DIAGNOSIS — M170 Bilateral primary osteoarthritis of knee: Secondary | ICD-10-CM | POA: Diagnosis not present

## 2019-07-14 DIAGNOSIS — M25551 Pain in right hip: Secondary | ICD-10-CM | POA: Diagnosis not present

## 2019-07-14 DIAGNOSIS — M25512 Pain in left shoulder: Secondary | ICD-10-CM | POA: Diagnosis not present

## 2019-07-14 DIAGNOSIS — M79661 Pain in right lower leg: Secondary | ICD-10-CM | POA: Diagnosis not present

## 2019-07-14 DIAGNOSIS — M25562 Pain in left knee: Secondary | ICD-10-CM | POA: Diagnosis not present

## 2019-07-14 DIAGNOSIS — M79662 Pain in left lower leg: Secondary | ICD-10-CM | POA: Diagnosis not present

## 2019-07-14 DIAGNOSIS — M545 Low back pain: Secondary | ICD-10-CM | POA: Diagnosis not present

## 2019-07-14 DIAGNOSIS — M25561 Pain in right knee: Secondary | ICD-10-CM | POA: Diagnosis not present

## 2019-07-14 DIAGNOSIS — M25511 Pain in right shoulder: Secondary | ICD-10-CM | POA: Diagnosis not present

## 2019-08-01 ENCOUNTER — Ambulatory Visit (INDEPENDENT_AMBULATORY_CARE_PROVIDER_SITE_OTHER): Payer: Medicare Other

## 2019-08-01 ENCOUNTER — Other Ambulatory Visit: Payer: Self-pay

## 2019-08-01 VITALS — BP 142/78 | HR 95 | Temp 96.9°F | Resp 16 | Ht 69.0 in | Wt 191.8 lb

## 2019-08-01 DIAGNOSIS — I10 Essential (primary) hypertension: Secondary | ICD-10-CM | POA: Diagnosis not present

## 2019-08-01 DIAGNOSIS — Z0000 Encounter for general adult medical examination without abnormal findings: Secondary | ICD-10-CM

## 2019-08-01 DIAGNOSIS — D649 Anemia, unspecified: Secondary | ICD-10-CM | POA: Diagnosis not present

## 2019-08-01 DIAGNOSIS — R790 Abnormal level of blood mineral: Secondary | ICD-10-CM | POA: Diagnosis not present

## 2019-08-01 DIAGNOSIS — Z23 Encounter for immunization: Secondary | ICD-10-CM | POA: Diagnosis not present

## 2019-08-01 DIAGNOSIS — R739 Hyperglycemia, unspecified: Secondary | ICD-10-CM | POA: Diagnosis not present

## 2019-08-01 DIAGNOSIS — Z96652 Presence of left artificial knee joint: Secondary | ICD-10-CM | POA: Diagnosis not present

## 2019-08-01 DIAGNOSIS — M1711 Unilateral primary osteoarthritis, right knee: Secondary | ICD-10-CM | POA: Diagnosis not present

## 2019-08-01 DIAGNOSIS — E8881 Metabolic syndrome: Secondary | ICD-10-CM | POA: Diagnosis not present

## 2019-08-01 NOTE — Progress Notes (Signed)
Subjective:   [name] is a 58 y.o. female who presents for Medicare Annual (Subsequent) preventive examination.  Review of Systems:   Cardiac Risk Factors include: dyslipidemia;hypertension     Objective:     Vitals: BP (!) 142/78 (BP Location: Right Arm, Patient Position: Sitting, Cuff Size: Normal)   Pulse 95   Temp (!) 96.9 F (36.1 C) (Temporal)   Resp 16   Ht '5\' 9"'$  (1.753 m)   Wt 191 lb 12.8 oz (87 kg)   SpO2 98%   BMI 28.32 kg/m   Body mass index is 28.32 kg/m.  Advanced Directives 08/01/2019 04/12/2019 04/12/2019 04/11/2019 04/11/2019 09/23/2018 08/15/2018  Does Patient Have a Medical Advance Directive? No No No No No No No  Would patient like information on creating a medical advance directive? No - Patient declined No - Guardian declined No - Patient declined No - Patient declined No - Patient declined No - Patient declined No - Patient declined    Tobacco Social History   Tobacco Use  Smoking Status Former Smoker  . Packs/day: 0.25  . Years: 20.00  . Pack years: 5.00  . Types: Cigarettes  . Start date: 07/26/2002  . Quit date: 04/06/2019  . Years since quitting: 0.3  Smokeless Tobacco Never Used  Tobacco Comment   Quit Date 04/06/2019     Counseling given: Not Answered Comment: Quit Date 04/06/2019   Clinical Intake:  Pre-visit preparation completed: Yes  Pain : 0-10 Pain Score: 8  Pain Type: Chronic pain Pain Location: Leg Pain Orientation: Left Pain Descriptors / Indicators: Aching, Discomfort, Sore Pain Onset: More than a month ago Pain Frequency: Constant     BMI - recorded: 28.32 Nutritional Status: BMI 25 -29 Overweight Nutritional Risks: None Diabetes: No CBG done?: No CBG resulted in Enter/ Edit results?: No Did pt. bring in CBG monitor from home?: No  How often do you need to have someone help you when you read instructions, pamphlets, or other written materials from your doctor or pharmacy?: 1 - Never  Interpreter Needed?: No   Information entered by :: Clemetine Marker LPN  Past Medical History:  Diagnosis Date  . Arthritis    bilateral knees  . Contact dermatitis and other eczema, due to unspecified cause   . COPD (chronic obstructive pulmonary disease) ([place])   . Depressive disorder   . Dysmetabolic syndrome X   . GERD (gastroesophageal reflux disease)   . Hyperlipidemia   . Hypertension   . IBS (irritable bowel syndrome)   . Insomnia   . Lumbago   . Nonspecific abnormal electrocardiogram (ECG) (EKG)   . Other ovarian failure(256.39)   . Postmenopausal atrophic vaginitis   . Symptomatic menopausal or female climacteric states   . Unspecified vitamin D deficiency    Past Surgical History:  Procedure Laterality Date  . BACK SURGERY    . BREAST BIOPSY Left    neg  . BREAST LUMPECTOMY Left   . CARPAL TUNNEL RELEASE Left 08/15/2018   Procedure: CARPAL TUNNEL RELEASE;  Surgeon: Earnestine Leys, MD;  Location: [facility];  Service: Orthopedics;  Laterality: Left;  . CERVICAL DISCECTOMY     x 2; metal plate  . CHOLECYSTECTOMY    . COLONOSCOPY WITH PROPOFOL N/A 08/08/2018   Procedure: COLONOSCOPY WITH PROPOFOL;  Surgeon: Virgel Manifold, MD;  Location: [facility];  Service: Endoscopy;  Laterality: N/A;  . DILATATION & CURETTAGE/HYSTEROSCOPY WITH MYOSURE N/A 03/21/2015   Procedure: DILATATION & CURETTAGE/HYSTEROSCOPY;  Surgeon: Aletha Halim,  MD;  Location: [facility];  Service: Gynecology;  Laterality: N/A;  . DILATION AND CURETTAGE OF UTERUS    . ENDOMETRIAL ABLATION    . ESOPHAGOGASTRODUODENOSCOPY (EGD) WITH PROPOFOL N/A 04/13/2018   Procedure: ESOPHAGOGASTRODUODENOSCOPY (EGD) WITH PROPOFOL;  Surgeon: Virgel Manifold, MD;  Location: [facility];  Service: Endoscopy;  Laterality: N/A;  . ESOPHAGOGASTRODUODENOSCOPY (EGD) WITH PROPOFOL N/A 08/08/2018   Procedure: ESOPHAGOGASTRODUODENOSCOPY (EGD) WITH PROPOFOL;  Surgeon: Virgel Manifold, MD;  Location: [facility];  Service: Endoscopy;   Laterality: N/A;  . SPINAL FUSION     lumbar x2  . TONSILLECTOMY    . TOTAL KNEE ARTHROPLASTY Left 04/12/2019   Procedure: TOTAL KNEE ARTHROPLASTY;  Surgeon: Lovell Sheehan, MD;  Location: [facility];  Service: Orthopedics;  Laterality: Left;  . TUBAL LIGATION     Family History  Problem Relation Age of Onset  . Heart disease Mother   . Thyroid cancer Mother   . Depression Daughter   . Asthma Daughter    Social History   Socioeconomic History  . Marital status: Legally Separated    Spouse name: Not on file  . Number of children: 4  . Years of education: Not on file  . Highest education level: 12th grade  Occupational History  . Occupation: disabled    Comment: chronic back pain   Social Needs  . Financial resource strain: Not hard at all  . Food insecurity    Worry: Never true    Inability: Never true  . Transportation needs    Medical: No    Non-medical: No  Tobacco Use  . Smoking status: Former Smoker    Packs/day: 0.25    Years: 20.00    Pack years: 5.00    Types: Cigarettes    Start date: 07/26/2002    Quit date: 04/06/2019    Years since quitting: 0.3  . Smokeless tobacco: Never Used  . Tobacco comment: Quit Date 04/06/2019  Substance and Sexual Activity  . Alcohol use: No    Alcohol/week: 0.0 standard drinks  . Drug use: No  . Sexual activity: Yes    Partners: Male    Birth control/protection: Other-see comments    Comment: Ablation  Lifestyle  . Physical activity    Days per week: 0 days    Minutes per session: 0 min  . Stress: Only a little  Relationships  . Social Herbalist on phone: Patient refused    Gets together: Patient refused    Attends religious service: Patient refused    Active member of club or organization: Patient refused    Attends meetings of clubs or organizations: Patient refused    Relationship status: Separated  Other Topics Concern  . Not on file  Social History Narrative  . Not on file    Outpatient Encounter  Medications as of 08/01/2019  Medication Sig  . amitriptyline (ELAVIL) 10 MG tablet Take 1 tablet (10 mg total) by mouth daily. For IBS  . amLODipine (NORVASC) 5 MG tablet Take 1 tablet (5 mg total) by mouth daily.  Marland Kitchen aspirin 81 MG chewable tablet Chew 1 tablet (81 mg total) by mouth 2 (two) times daily. (Patient taking differently: Chew 81 mg by mouth once. )  . atorvastatin (LIPITOR) 40 MG tablet Take 1 tablet (40 mg total) by mouth daily.  . cholecalciferol (VITAMIN D) 1000 units tablet Take 1,000 Units by mouth daily.  Marland Kitchen desvenlafaxine (PRISTIQ) 50 MG 24 hr tablet TAKE ONE TABLET BY MOUTH  once daily  . gabapentin (NEURONTIN) 300 MG capsule TAKE ONE CAPSULE BY MOUTH EVERY TWELVE HOURS  . lidocaine (LIDODERM) 5 % Place 1 patch onto the skin daily. Remove & Discard patch within 12 hours or as directed by MD (Patient taking differently: Place 1 patch onto the skin daily as needed (pain). Remove & Discard patch within 12 hours or as directed by MD)  . losartan-hydrochlorothiazide (HYZAAR) 100-25 MG tablet TAKE ONE TABLET BY MOUTH ONCE DAILY  . meloxicam (MOBIC) 15 MG tablet Take 1 tablet (15 mg total) by mouth daily.  . metFORMIN (GLUCOPHAGE) 500 MG tablet Take 1 tablet (500 mg total) by mouth daily with breakfast.  . NARCAN 4 MG/0.1ML LIQD nasal spray kit OPIOD EMERGENCY 1 SPRAY IN 1 NOSTRIL 1 TIME, MAY REPEAT IN OTHER NOSTRIL EVERY 2 - 3 MINS UNTIL RESPONSIVE /EMS ARRIVES  . Oxycodone HCl 20 MG TABS Take 20 mg by mouth every 4 (four) hours.  Marland Kitchen QUEtiapine (SEROQUEL) 25 MG tablet Take 1 tablet (25 mg total) by mouth at bedtime.  Marland Kitchen tiZANidine (ZANAFLEX) 4 MG tablet Take 4 mg by mouth every 12 (twelve) hours.   Marland Kitchen omeprazole (PRILOSEC) 20 MG capsule Take 1 capsule (20 mg total) by mouth daily. (Patient not taking: Reported on 08/01/2019)  . [DISCONTINUED] docusate sodium (COLACE) 100 MG capsule Take 1 capsule (100 mg total) by mouth 2 (two) times daily. (Patient not taking: Reported on 08/01/2019)  .  [DISCONTINUED] varenicline (CHANTIX CONTINUING MONTH PAK) 1 MG tablet Take 1 tablet (1 mg total) by mouth 2 (two) times daily. (Patient not taking: Reported on 07/13/2019)   No facility-administered encounter medications on file as of 08/01/2019.     Activities of Daily Living In your present state of health, do you have any difficulty performing the following activities: 08/01/2019 07/13/2019  Hearing? N N  Comment declines hearing aids -  Vision? N N  Difficulty concentrating or making decisions? N N  Walking or climbing stairs? Y Y  Comment improving -  Dressing or bathing? N N  Doing errands, shopping? N N  Preparing Food and eating ? N -  Using the Toilet? N -  In the past six months, have you accidently leaked urine? N -  Do you have problems with loss of bowel control? N -  Managing your Medications? N -  Managing your Finances? N -  Housekeeping or managing your Housekeeping? N -  Some recent data might be hidden    Patient Care Team: Steele Sizer, MD as PCP - General (Family Medicine) Shanon Ace, MD as Consulting Physician (Anesthesiology) Virgel Manifold, MD as Consulting Physician (Gastroenterology) Earnestine Leys, MD as Consulting Physician (Orthopedic Surgery) Marlowe Sax, MD as Consulting Physician (Internal Medicine)    Assessment:   This is a routine wellness examination for [name].  Exercise Activities and Dietary recommendations Current Exercise Habits: The patient does not participate in regular exercise at present, Exercise limited by: orthopedic condition(s)  Goals    . Quit Smoking     Recommend to continue efforts to reduce smoking habits until no longer smoking (Smoking Cessation literature attached to AVS).       Fall Risk Fall Risk  08/01/2019 07/13/2019 04/06/2019 11/23/2018 07/28/2018  Falls in the past year? 1 1 0 0 No  Number falls in past yr: 1 0 0 - -  Injury with Fall? 0 0 0 - -  Risk for fall due to : History of  fall(s) - - - Impaired vision;Impaired  balance/gait  Risk for fall due to: Comment - - - - wears eyeglasses; joint pain and back pain  Follow up Falls prevention discussed - Falls evaluation completed - -   FALL RISK PREVENTION PERTAINING TO THE HOME:  Any stairs in or around the home? Yes  If so, do they handrails? No  3 steps to front door, patient uses kitchen door with one steps  Home free of loose throw rugs in walkways, pet beds, electrical cords, etc? Yes  Adequate lighting in your home to reduce risk of falls? Yes   ASSISTIVE DEVICES UTILIZED TO PREVENT FALLS:  Life alert? No  Use of a cane, walker or w/c? No  Grab bars in the bathroom? No  Shower chair or bench in shower? No  Elevated toilet seat or a handicapped toilet? Yes   DME ORDERS:  DME order needed?  No   TIMED UP AND GO:  Was the test performed? Yes .  Length of time to ambulate 10 feet: 6 sec.   GAIT:  Appearance of gait: Gait stead-fast and without the use of an assistive device.   Education: Fall risk prevention has been discussed.  Intervention(s) required? No   Depression Screen PHQ 2/9 Scores 08/01/2019 07/13/2019 04/06/2019 11/23/2018  PHQ - 2 Score 0 0 0 1  PHQ- 9 Score - 4 0 2     Cognitive Function - Pt declines 6CIT for 2020 AWV     6CIT Screen 07/28/2018  What Year? 0 points  What month? 0 points  What time? 0 points  Count back from 20 0 points  Months in reverse 0 points  Repeat phrase 0 points  Total Score 0    Immunization History  Administered Date(s) Administered  . Influenza,inj,Quad PF,6+ Mos 08/08/2015, 08/26/2016, 07/26/2017, 07/28/2018, 08/01/2019  . Influenza-Unspecified 09/02/2013, 07/03/2014    Qualifies for Shingles Vaccine? Yes . Due for Shingrix. Education has been provided regarding the importance of this vaccine. Pt has been advised to call insurance company to determine out of pocket expense. Advised may also receive vaccine at local pharmacy or Health Dept.  Verbalized acceptance and understanding.  Tdap: Although this vaccine is not a covered service during a Wellness Exam, does the patient still wish to receive this vaccine today?  No .  Education has been provided regarding the importance of this vaccine. Advised may receive this vaccine at local pharmacy or Health Dept. Aware to provide a copy of the vaccination record if obtained from local pharmacy or Health Dept. Verbalized acceptance and understanding.  Flu Vaccine: Due for Flu vaccine. Does the patient want to receive this vaccine today?  Yes . Education has been provided regarding the importance of this vaccine but still declined. Advised may receive this vaccine at local pharmacy or Health Dept. Aware to provide a copy of the vaccination record if obtained from local pharmacy or Health Dept. Verbalized acceptance and understanding.  Pneumococcal Vaccine: Due for Pneumococcal vaccine. Does the patient want to receive this vaccine today?  No . Education has been provided regarding the importance of this vaccine but still declined. Advised may receive this vaccine at local pharmacy or Health Dept. Aware to provide a copy of the vaccination record if obtained from local pharmacy or Health Dept. Verbalized acceptance and understanding.   Screening Tests Health Maintenance  Topic Date Due  . INFLUENZA VACCINE  06/03/2019  . MAMMOGRAM  08/05/2019  . TETANUS/TDAP  04/17/2020  . PAP SMEAR-Modifier  08/03/2021  . COLONOSCOPY  08/08/2021  .  Hepatitis C Screening  Completed  . HIV Screening  Completed   Cancer Screenings:  Colorectal Screening: Completed 08/08/18. Repeat every 3 years;   Mammogram: Completed 08/04/18. Repeat every year. Ordered today. Pt provided with contact information and advised to call to schedule appt.   Bone Density: due age 42  Lung Cancer Screening: (Low Dose CT Chest recommended if Age 62-80 years, 30 pack-year currently smoking OR have quit w/in 15years.) does not  qualify.    Additional Screening:  Hepatitis C Screening: does qualify; Completed 11/23/18  Vision Screening: Recommended annual ophthalmology exams for early detection of glaucoma and other disorders of the eye. Is the patient up to date with their annual eye exam?  Yes  Who is the provider or what is the name of the office in which the pt attends annual eye exams? [facility]  Dental Screening: Recommended annual dental exams for proper oral hygiene  Community Resource Referral:  CRR required this visit?  No      Plan:    I have personally reviewed and addressed the Medicare Annual Wellness questionnaire and have noted the following in the patient's chart:  A. Medical and social history B. Use of alcohol, tobacco or illicit drugs  C. Current medications and supplements D. Functional ability and status E.  Nutritional status F.  Physical activity G. Advance directives H. List of other physicians I.  Hospitalizations, surgeries, and ER visits in previous 12 months J.  [place] such as hearing and vision if needed, cognitive and depression L. Referrals and appointments   In addition, I have reviewed and discussed with patient certain preventive protocols, quality metrics, and best practice recommendations. A written personalized care plan for preventive services as well as general preventive health recommendations were provided to patient.   Signed,  Clemetine Marker, LPN Nurse Health Advisor   Nurse Notes: pt c/o knee pain s/p TKA in June on left knee. Pt has follow up appt today with [facility].

## 2019-08-01 NOTE — Patient Instructions (Signed)
[name] , Thank you for taking time to come for your Medicare Wellness Visit. I appreciate your ongoing commitment to your health goals. Please review the following plan we discussed and let me know if I can assist you in the future.   Screening recommendations/referrals: Colonoscopy: done 08/08/18. Repeat in 2022 Mammogram: done 08/04/18. Please call [phone] to schedule your mammogram.  Bone Density: due age 58 Recommended yearly ophthalmology/optometry visit for glaucoma screening and checkup Recommended yearly dental visit for hygiene and checkup  Vaccinations: Influenza vaccine: done today Tdap vaccine: due - please contact us if you get a cut or scrape Shingles vaccine: Shingrix discussed. Please contact your pharmacy for coverage information.   Advanced directives: Advance directive discussed with you today. Even though you declined this today please call our office should you change your mind and we can give you the proper paperwork for you to fill out.  Conditions/risks identified: Keep up the great work!  Next appointment: Please follow up in one year for your Medicare Annual Wellness visit.    Preventive Care 40-64 Years, Female Preventive care refers to lifestyle choices and visits with your health care provider that can promote health and wellness. What does preventive care include?  A yearly physical exam. This is also called an annual well check.  Dental exams once or twice a year.  Routine eye exams. Ask your health care provider how often you should have your eyes checked.  Personal lifestyle choices, including:  Daily care of your teeth and gums.  Regular physical activity.  Eating a healthy diet.  Avoiding tobacco and drug use.  Limiting alcohol use.  Practicing safe sex.  Taking low-dose aspirin daily starting at age 61.  Taking vitamin and mineral supplements as recommended by your health care provider. What happens during an annual well  check? The services and screenings done by your health care provider during your annual well check will depend on your age, overall health, lifestyle risk factors, and family history of disease. Counseling  Your health care provider may ask you questions about your:  Alcohol use.  Tobacco use.  Drug use.  Emotional well-being.  Home and relationship well-being.  Sexual activity.  Eating habits.  Work and work Statistician.  Method of birth control.  Menstrual cycle.  Pregnancy history. Screening  You may have the following tests or measurements:  Height, weight, and BMI.  Blood pressure.  Lipid and cholesterol levels. These may be checked every 5 years, or more frequently if you are over 109 years old.  Skin check.  Lung cancer screening. You may have this screening every year starting at age 25 if you have a 30-pack-year history of smoking and currently smoke or have quit within the past 15 years.  Fecal occult blood test (FOBT) of the stool. You may have this test every year starting at age 51.  Flexible sigmoidoscopy or colonoscopy. You may have a sigmoidoscopy every 5 years or a colonoscopy every 10 years starting at age 30.  Hepatitis C blood test.  Hepatitis B blood test.  Sexually transmitted disease (STD) testing.  Diabetes screening. This is done by checking your blood sugar (glucose) after you have not eaten for a while (fasting). You may have this done every 1-3 years.  Mammogram. This may be done every 1-2 years. Talk to your health care provider about when you should start having regular mammograms. This may depend on whether you have a family history of breast cancer.  BRCA-related cancer screening.  This may be done if you have a family history of breast, ovarian, tubal, or peritoneal cancers.  Pelvic exam and Pap test. This may be done every 3 years starting at age 11. Starting at age 69, this may be done every 5 years if you have a Pap test in  combination with an HPV test.  Bone density scan. This is done to screen for osteoporosis. You may have this scan if you are at high risk for osteoporosis. Discuss your test results, treatment options, and if necessary, the need for more tests with your health care provider. Vaccines  Your health care provider may recommend certain vaccines, such as:  Influenza vaccine. This is recommended every year.  Tetanus, diphtheria, and acellular pertussis (Tdap, Td) vaccine. You may need a Td booster every 10 years.  Zoster vaccine. You may need this after age 88.  Pneumococcal 13-valent conjugate (PCV13) vaccine. You may need this if you have certain conditions and were not previously vaccinated.  Pneumococcal polysaccharide (PPSV23) vaccine. You may need one or two doses if you smoke cigarettes or if you have certain conditions. Talk to your health care provider about which screenings and vaccines you need and how often you need them. This information is not intended to replace advice given to you by your health care provider. Make sure you discuss any questions you have with your health care provider. Document Released: 11/15/2015 Document Revised: 07/08/2016 Document Reviewed: 08/20/2015 Elsevier Interactive Patient Education  2017 [place] Prevention in the Home Falls can cause injuries. They can happen to people of all ages. There are many things you can do to make your home safe and to help prevent falls. What can I do on the outside of my home?  Regularly fix the edges of walkways and driveways and fix any cracks.  Remove anything that might make you trip as you walk through a door, such as a raised step or threshold.  Trim any bushes or trees on the path to your home.  Use bright outdoor lighting.  Clear any walking paths of anything that might make someone trip, such as rocks or tools.  Regularly check to see if handrails are loose or broken. Make sure that both  sides of any steps have handrails.  Any raised decks and porches should have guardrails on the edges.  Have any leaves, snow, or ice cleared regularly.  Use sand or salt on walking paths during winter.  Clean up any spills in your garage right away. This includes oil or grease spills. What can I do in the bathroom?  Use night lights.  Install grab bars by the toilet and in the tub and shower. Do not use towel bars as grab bars.  Use non-skid mats or decals in the tub or shower.  If you need to sit down in the shower, use a plastic, non-slip stool.  Keep the floor dry. Clean up any water that spills on the floor as soon as it happens.  Remove soap buildup in the tub or shower regularly.  Attach bath mats securely with double-sided non-slip rug tape.  Do not have throw rugs and other things on the floor that can make you trip. What can I do in the bedroom?  Use night lights.  Make sure that you have a light by your bed that is easy to reach.  Do not use any sheets or blankets that are too big for your bed. They should not hang  down onto the floor.  Have a firm chair that has side arms. You can use this for support while you get dressed.  Do not have throw rugs and other things on the floor that can make you trip. What can I do in the kitchen?  Clean up any spills right away.  Avoid walking on wet floors.  Keep items that you use a lot in easy-to-reach places.  If you need to reach something above you, use a strong step stool that has a grab bar.  Keep electrical cords out of the way.  Do not use floor polish or wax that makes floors slippery. If you must use wax, use non-skid floor wax.  Do not have throw rugs and other things on the floor that can make you trip. What can I do with my stairs?  Do not leave any items on the stairs.  Make sure that there are handrails on both sides of the stairs and use them. Fix handrails that are broken or loose. Make sure that  handrails are as long as the stairways.  Check any carpeting to make sure that it is firmly attached to the stairs. Fix any carpet that is loose or worn.  Avoid having throw rugs at the top or bottom of the stairs. If you do have throw rugs, attach them to the floor with carpet tape.  Make sure that you have a light switch at the top of the stairs and the bottom of the stairs. If you do not have them, ask someone to add them for you. What else can I do to help prevent falls?  Wear shoes that:  Do not have high heels.  Have rubber bottoms.  Are comfortable and fit you well.  Are closed at the toe. Do not wear sandals.  If you use a stepladder:  Make sure that it is fully opened. Do not climb a closed stepladder.  Make sure that both sides of the stepladder are locked into place.  Ask someone to hold it for you, if possible.  Clearly mark and make sure that you can see:  Any grab bars or handrails.  First and last steps.  Where the edge of each step is.  Use tools that help you move around (mobility aids) if they are needed. These include:  Canes.  Walkers.  Scooters.  Crutches.  Turn on the lights when you go into a dark area. Replace any light bulbs as soon as they burn out.  Set up your furniture so you have a clear path. Avoid moving your furniture around.  If any of your floors are uneven, fix them.  If there are any pets around you, be aware of where they are.  Review your medicines with your doctor. Some medicines can make you feel dizzy. This can increase your chance of falling. Ask your doctor what other things that you can do to help prevent falls. This information is not intended to replace advice given to you by your health care provider. Make sure you discuss any questions you have with your health care provider. Document Released: 08/15/2009 Document Revised: 03/26/2016 Document Reviewed: 11/23/2014 Elsevier Interactive Patient Education  2017  [employer].

## 2019-08-02 LAB — CBC WITH DIFFERENTIAL/PLATELET
Absolute Monocytes: 593 cells/uL (ref 200–950)
Basophils Absolute: 31 cells/uL (ref 0–200)
Basophils Relative: 0.3 %
Eosinophils Absolute: 21 cells/uL (ref 15–500)
Eosinophils Relative: 0.2 %
HCT: 36.8 % (ref 35.0–45.0)
Hemoglobin: 12.2 g/dL (ref 11.7–15.5)
Lymphs Abs: 2298 cells/uL (ref 850–3900)
MCH: 30.6 pg (ref 27.0–33.0)
MCHC: 33.2 g/dL (ref 32.0–36.0)
MCV: 92.2 fL (ref 80.0–100.0)
MPV: 11.2 fL (ref 7.5–12.5)
Monocytes Relative: 5.7 %
Neutro Abs: 7457 cells/uL (ref 1500–7800)
Neutrophils Relative %: 71.7 %
Platelets: 344 10*3/uL (ref 140–400)
RBC: 3.99 10*6/uL (ref 3.80–5.10)
RDW: 13.7 % (ref 11.0–15.0)
Total Lymphocyte: 22.1 %
WBC: 10.4 10*3/uL (ref 3.8–10.8)

## 2019-08-02 LAB — COMPLETE METABOLIC PANEL WITH GFR
AG Ratio: 1.9 (calc) (ref 1.0–2.5)
ALT: 28 U/L (ref 6–29)
AST: 29 U/L (ref 10–35)
Albumin: 5 g/dL (ref 3.6–5.1)
Alkaline phosphatase (APISO): 106 U/L (ref 37–153)
BUN: 14 mg/dL (ref 7–25)
CO2: 26 mmol/L (ref 20–32)
Calcium: 10.2 mg/dL (ref 8.6–10.4)
Chloride: 106 mmol/L (ref 98–110)
Creat: 0.78 mg/dL (ref 0.50–1.05)
GFR, Est African American: 98 mL/min/{1.73_m2} (ref >=60)
GFR, Est Non African American: 85 mL/min/{1.73_m2} (ref >=60)
Globulin: 2.6 g/dL (calc) (ref 1.9–3.7)
Glucose, Bld: 97 mg/dL (ref 65–99)
Potassium: 4 mmol/L (ref 3.5–5.3)
Sodium: 142 mmol/L (ref 135–146)
Total Bilirubin: 0.5 mg/dL (ref 0.2–1.2)
Total Protein: 7.6 g/dL (ref 6.1–8.1)

## 2019-08-02 LAB — IRON,TIBC AND FERRITIN PANEL
%SAT: 24 % (calc) (ref 16–45)
Ferritin: 154 ng/mL (ref 16–232)
Iron: 76 ug/dL (ref 45–160)
TIBC: 317 mcg/dL (calc) (ref 250–450)

## 2019-08-02 LAB — HEMOGLOBIN A1C
Hgb A1c MFr Bld: 5.6 % of total Hgb (ref <5.7)
Mean Plasma Glucose: 114 (calc)
eAG (mmol/L): 6.3 (calc)

## 2019-08-02 LAB — MAGNESIUM: Magnesium: 2 mg/dL (ref 1.5–2.5)

## 2019-08-10 DIAGNOSIS — M25562 Pain in left knee: Secondary | ICD-10-CM | POA: Diagnosis not present

## 2019-08-10 DIAGNOSIS — M25551 Pain in right hip: Secondary | ICD-10-CM | POA: Diagnosis not present

## 2019-08-10 DIAGNOSIS — M25552 Pain in left hip: Secondary | ICD-10-CM | POA: Diagnosis not present

## 2019-08-10 DIAGNOSIS — M25512 Pain in left shoulder: Secondary | ICD-10-CM | POA: Diagnosis not present

## 2019-08-10 DIAGNOSIS — M542 Cervicalgia: Secondary | ICD-10-CM | POA: Diagnosis not present

## 2019-08-10 DIAGNOSIS — M79662 Pain in left lower leg: Secondary | ICD-10-CM | POA: Diagnosis not present

## 2019-08-10 DIAGNOSIS — M25561 Pain in right knee: Secondary | ICD-10-CM | POA: Diagnosis not present

## 2019-08-10 DIAGNOSIS — M170 Bilateral primary osteoarthritis of knee: Secondary | ICD-10-CM | POA: Diagnosis not present

## 2019-08-10 DIAGNOSIS — G894 Chronic pain syndrome: Secondary | ICD-10-CM | POA: Diagnosis not present

## 2019-08-10 DIAGNOSIS — M545 Low back pain: Secondary | ICD-10-CM | POA: Diagnosis not present

## 2019-08-10 DIAGNOSIS — M79661 Pain in right lower leg: Secondary | ICD-10-CM | POA: Diagnosis not present

## 2019-08-10 DIAGNOSIS — M25511 Pain in right shoulder: Secondary | ICD-10-CM | POA: Diagnosis not present

## 2019-08-22 ENCOUNTER — Other Ambulatory Visit: Payer: Self-pay

## 2019-08-22 DIAGNOSIS — Z20822 Contact with and (suspected) exposure to covid-19: Secondary | ICD-10-CM

## 2019-08-23 LAB — NOVEL CORONAVIRUS, NAA: SARS-CoV-2, NAA: NOT DETECTED

## 2019-09-12 DIAGNOSIS — M542 Cervicalgia: Secondary | ICD-10-CM | POA: Diagnosis not present

## 2019-09-12 DIAGNOSIS — M25552 Pain in left hip: Secondary | ICD-10-CM | POA: Diagnosis not present

## 2019-09-12 DIAGNOSIS — M25511 Pain in right shoulder: Secondary | ICD-10-CM | POA: Diagnosis not present

## 2019-09-12 DIAGNOSIS — M79662 Pain in left lower leg: Secondary | ICD-10-CM | POA: Diagnosis not present

## 2019-09-12 DIAGNOSIS — M79661 Pain in right lower leg: Secondary | ICD-10-CM | POA: Diagnosis not present

## 2019-09-12 DIAGNOSIS — M5431 Sciatica, right side: Secondary | ICD-10-CM | POA: Diagnosis not present

## 2019-09-12 DIAGNOSIS — M25561 Pain in right knee: Secondary | ICD-10-CM | POA: Diagnosis not present

## 2019-09-12 DIAGNOSIS — M5432 Sciatica, left side: Secondary | ICD-10-CM | POA: Diagnosis not present

## 2019-09-12 DIAGNOSIS — M25512 Pain in left shoulder: Secondary | ICD-10-CM | POA: Diagnosis not present

## 2019-09-12 DIAGNOSIS — M25551 Pain in right hip: Secondary | ICD-10-CM | POA: Diagnosis not present

## 2019-09-12 DIAGNOSIS — M545 Low back pain: Secondary | ICD-10-CM | POA: Diagnosis not present

## 2019-09-12 DIAGNOSIS — M25562 Pain in left knee: Secondary | ICD-10-CM | POA: Diagnosis not present

## 2019-09-13 ENCOUNTER — Other Ambulatory Visit: Payer: Self-pay | Admitting: Family Medicine

## 2019-09-13 DIAGNOSIS — Z1231 Encounter for screening mammogram for malignant neoplasm of breast: Secondary | ICD-10-CM

## 2019-11-23 ENCOUNTER — Telehealth: Payer: Self-pay | Admitting: Family Medicine

## 2019-11-23 NOTE — Telephone Encounter (Signed)
Patient is calling to request a referral for a dermatologist for a referrall for a bump in her nose. Please advise Cb- [phone]

## 2019-11-28 NOTE — Telephone Encounter (Signed)
Lvm for scheduleing

## 2019-11-29 ENCOUNTER — Other Ambulatory Visit: Payer: Self-pay | Admitting: Family Medicine

## 2019-11-29 DIAGNOSIS — F339 Major depressive disorder, recurrent, unspecified: Secondary | ICD-10-CM

## 2019-11-29 MED ORDER — DESVENLAFAXINE SUCCINATE ER 50 MG PO TB24: 50.0000 mg | ORAL_TABLET | Freq: Every day | ORAL | 0 refills | Status: DC

## 2019-11-29 MED ORDER — OMEPRAZOLE 20 MG PO CPDR: 20.0000 mg | DELAYED_RELEASE_CAPSULE | Freq: Every day | ORAL | 0 refills | Status: DC

## 2019-11-29 NOTE — Telephone Encounter (Signed)
Pharm is calling and pt needs refills on pristiq , omeprazole, 30 day supply

## 2019-12-01 ENCOUNTER — Encounter: Payer: Self-pay | Admitting: Family Medicine

## 2019-12-01 ENCOUNTER — Ambulatory Visit (INDEPENDENT_AMBULATORY_CARE_PROVIDER_SITE_OTHER): Payer: Medicare Other | Admitting: Family Medicine

## 2019-12-01 DIAGNOSIS — M25551 Pain in right hip: Secondary | ICD-10-CM

## 2019-12-01 DIAGNOSIS — B001 Herpesviral vesicular dermatitis: Secondary | ICD-10-CM

## 2019-12-01 DIAGNOSIS — F329 Major depressive disorder, single episode, unspecified: Secondary | ICD-10-CM | POA: Diagnosis not present

## 2019-12-01 MED ORDER — VALACYCLOVIR HCL 1 G PO TABS: 1000.0000 mg | ORAL_TABLET | Freq: Two times a day (BID) | ORAL | 0 refills | Status: DC

## 2019-12-01 NOTE — Progress Notes (Signed)
Name: [name]   MRN: [id number]    DOB: Apr 24, 1961   Date:12/01/2019       Progress Note  Subjective  Chief Complaint  Chief Complaint  Patient presents with   Referral    She would like a referral for Dermatology. She had a bump that came up on the tip of her nose and spreaded into clusters. Spot is gradually starting to dry up now. Spot was tender to touch. It looked like blisters.   Groin Pain    She has pain in the crease of her thigh near her vaginal area. Pain is pulling and very painful. Hurts to turn over in bed, stand up and go to bathroom. Onset x 1 month.    I connected with  [name] on 12/01/19 at  3:40 PM EST by telephone and verified that I am speaking with the correct person using two identifiers.  I discussed the limitations, risks, security and privacy concerns of performing an evaluation and management service by telephone and the availability of in person appointments. Staff also discussed with the patient that there may be a patient responsible charge related to this service. Patient Location: at home Provider Location: [place]   HPI  Blister: symptoms started last week, she sates she developed a blister on the bottom lip ( center ) and on the tip of her nose and towards the right, red base, blister on top and tender to touch. She never had fever blisters. She states it is drying it now. She used a topical cold sore medication on her lip and it improved symptoms.  Advised to hold off on referral to Dermatologist   Right groin pain: going on for about one month, feels like a pulling sensation when trying to go from sitting to standing and also when rolling in bed , she asked me if she should see Dr. Harlow Mares and I stated since unable to see her in person that would be the best option  Depression Major:long history of depression, got worse when mother died in Jan 11, 2017, she states she is feeling bad now because of the rash that was  present during her birthday and was very upset about it, her daughters still took her out to eat and she thinks she was just upset about it. She states she thinks she will get better and will call if no improvement of her mood   Patient Active Problem List   Diagnosis Date Noted   S/P TKR (total knee replacement) using cement, left 04/12/2019   Overweight (BMI 25.0-29.9) 04/06/2019   Peptic ulcer    Benign neoplasm of ascending colon    Diverticulosis of large intestine without diverticulitis    Simple chronic bronchitis ([place]) 08/03/2018   Pancreatic lesion 04/21/2018   Abdominal aortic atherosclerosis ([place]) 04/21/2018   Acute peptic ulcer of stomach    Gastric polyp    Abdominal pain, epigastric    Trigger thumb of right hand 02/25/2018   Carpal tunnel syndrome on both sides 10/21/2017   CRP elevated 08/09/2017   Elevated C-reactive protein 07/28/2017   Osteoarthritis of both knees 02/19/2016   COPD, mild ([place]) 05/08/2015   Vitamin D deficiency 99/35/7017   Metabolic syndrome 79/39/0300   Eczema 05/07/2015   Chronic insomnia 05/07/2015   Hypertension, benign 05/07/2015   Dyslipidemia 05/07/2015   Hyperglycemia 05/07/2015   Chronic low back pain 05/07/2015   IBS (irritable bowel syndrome) 05/07/2015   Gastroesophageal reflux disease without esophagitis 05/07/2015  Major depression, chronic ([place]) 05/07/2015   Menopausal syndrome (hot flashes) 05/07/2015   History of postmenopausal bleeding 01/14/2015    Past Surgical History:  Procedure Laterality Date   BACK SURGERY     BREAST BIOPSY Left    neg   BREAST LUMPECTOMY Left    CARPAL TUNNEL RELEASE Left 08/15/2018   Procedure: CARPAL TUNNEL RELEASE;  Surgeon: Earnestine Leys, MD;  Location: [facility];  Service: Orthopedics;  Laterality: Left;   CERVICAL DISCECTOMY     x 2; metal plate   CHOLECYSTECTOMY     COLONOSCOPY WITH PROPOFOL N/A 08/08/2018   Procedure: COLONOSCOPY WITH PROPOFOL;   Surgeon: Virgel Manifold, MD;  Location: [facility];  Service: Endoscopy;  Laterality: N/A;   DILATATION & CURETTAGE/HYSTEROSCOPY WITH MYOSURE N/A 03/21/2015   Procedure: DILATATION & CURETTAGE/HYSTEROSCOPY;  Surgeon: Aletha Halim, MD;  Location: [facility];  Service: Gynecology;  Laterality: N/A;   DILATION AND CURETTAGE OF UTERUS     ENDOMETRIAL ABLATION     ESOPHAGOGASTRODUODENOSCOPY (EGD) WITH PROPOFOL N/A 04/13/2018   Procedure: ESOPHAGOGASTRODUODENOSCOPY (EGD) WITH PROPOFOL;  Surgeon: Virgel Manifold, MD;  Location: [facility];  Service: Endoscopy;  Laterality: N/A;   ESOPHAGOGASTRODUODENOSCOPY (EGD) WITH PROPOFOL N/A 08/08/2018   Procedure: ESOPHAGOGASTRODUODENOSCOPY (EGD) WITH PROPOFOL;  Surgeon: Virgel Manifold, MD;  Location: [facility];  Service: Endoscopy;  Laterality: N/A;   SPINAL FUSION     lumbar x2   TONSILLECTOMY     TOTAL KNEE ARTHROPLASTY Left 04/12/2019   Procedure: TOTAL KNEE ARTHROPLASTY;  Surgeon: Lovell Sheehan, MD;  Location: [facility];  Service: Orthopedics;  Laterality: Left;   TUBAL LIGATION      Family History  Problem Relation Age of Onset   Heart disease Mother    Thyroid cancer Mother    Depression Daughter    Asthma Daughter      Current Outpatient Medications:    amitriptyline (ELAVIL) 10 MG tablet, Take 1 tablet (10 mg total) by mouth daily. For IBS, Disp: 30 tablet, Rfl: 5   amLODipine (NORVASC) 5 MG tablet, Take 1 tablet (5 mg total) by mouth daily., Disp: 90 tablet, Rfl: 3   atorvastatin (LIPITOR) 40 MG tablet, Take 1 tablet (40 mg total) by mouth daily., Disp: 30 tablet, Rfl: 5   cholecalciferol (VITAMIN D) 1000 units tablet, Take 1,000 Units by mouth daily., Disp: , Rfl:    desvenlafaxine (PRISTIQ) 50 MG 24 hr tablet, Take 1 tablet (50 mg total) by mouth daily., Disp: 30 tablet, Rfl: 0   gabapentin (NEURONTIN) 300 MG capsule, TAKE ONE CAPSULE BY MOUTH EVERY TWELVE HOURS, Disp: , Rfl:    lidocaine  (LIDODERM) 5 %, Place 1 patch onto the skin daily. Remove & Discard patch within 12 hours or as directed by MD (Patient taking differently: Place 1 patch onto the skin daily as needed (pain). Remove & Discard patch within 12 hours or as directed by MD), Disp: 30 patch, Rfl: 0   losartan-hydrochlorothiazide (HYZAAR) 100-25 MG tablet, TAKE ONE TABLET BY MOUTH ONCE DAILY, Disp: 30 tablet, Rfl: 5   meloxicam (MOBIC) 15 MG tablet, Take 1 tablet (15 mg total) by mouth daily., Disp: 30 tablet, Rfl: 0   metFORMIN (GLUCOPHAGE) 500 MG tablet, Take 1 tablet (500 mg total) by mouth daily with breakfast., Disp: 30 tablet, Rfl: 5   NARCAN 4 MG/0.1ML LIQD nasal spray kit, OPIOD EMERGENCY 1 SPRAY IN 1 NOSTRIL 1 TIME, MAY REPEAT IN OTHER NOSTRIL EVERY 2 - 3 MINS UNTIL RESPONSIVE /EMS ARRIVES, Disp: ,  Rfl:    omeprazole (PRILOSEC) 20 MG capsule, Take 1 capsule (20 mg total) by mouth daily., Disp: 30 capsule, Rfl: 0   Oxycodone HCl 20 MG TABS, Take 20 mg by mouth every 4 (four) hours., Disp: , Rfl:    QUEtiapine (SEROQUEL) 25 MG tablet, Take 1 tablet (25 mg total) by mouth at bedtime., Disp: 30 tablet, Rfl: 5   tiZANidine (ZANAFLEX) 4 MG tablet, Take 4 mg by mouth every 12 (twelve) hours. , Disp: , Rfl:    aspirin 81 MG chewable tablet, Chew 1 tablet (81 mg total) by mouth 2 (two) times daily. (Patient not taking: Reported on 12/01/2019), Disp: 60 tablet, Rfl: 1  No Known Allergies  I personally reviewed active problem list, medication list, allergies, family history, social history with the patient/caregiver today.   ROS  Ten systems reviewed and is negative except as mentioned in HPI   Objective  Virtual encounter, vitals not obtained.  There is no height or weight on file to calculate BMI.  Physical Exam  Awake, alert and oriented   PHQ2/9: Depression screen [facility] 2/9 12/01/2019 08/01/2019 07/13/2019 04/06/2019 11/23/2018  Decreased Interest 2 0 0 0 0  Down, Depressed, Hopeless 0 0 0 0 1  PHQ - 2  Score 2 0 0 0 1  Altered sleeping 3 - 3 0 1  Tired, decreased energy 1 - 1 0 0  Change in appetite 3 - 0 0 0  Feeling bad or failure about yourself  2 - 0 0 0  Trouble concentrating 0 - 0 0 0  Moving slowly or fidgety/restless 0 - 0 0 0  Suicidal thoughts 0 - 0 0 0  PHQ-9 Score 11 - 4 0 2  Difficult doing work/chores Not difficult at all - Not difficult at all Not difficult at all Not difficult at all  Some recent data might be hidden   PHQ-2/9 Result is positive.    Fall Risk: Fall Risk  08/01/2019 07/13/2019 04/06/2019 11/23/2018 07/28/2018  Falls in the past year? 1 1 0 0 No  Number falls in past yr: 1 0 0 - -  Injury with Fall? 0 0 0 - -  Risk for fall due to : History of fall(s) - - - Impaired vision;Impaired balance/gait  Risk for fall due to: Comment - - - - wears eyeglasses; joint pain and back pain  Follow up Falls prevention discussed - Falls evaluation completed - -     Assessment & Plan  1. Fever blister  - valACYclovir (VALTREX) 1000 MG tablet; Take 1 tablet (1,000 mg total) by mouth 2 (two) times daily. Prn outbreak  Dispense: 6 tablet; Refill: 0  2. Major depression, chronic (HCC)  She will monitor and come in sooner if needed  3. Right hip pain  She will contact ortho  I discussed the assessment and treatment plan with the patient. The patient was provided an opportunity to ask questions and all were answered. The patient agreed with the plan and demonstrated an understanding of the instructions.   The patient was advised to call back or seek an in-person evaluation if the symptoms worsen or if the condition fails to improve as anticipated.  I provided 15 minutes of non-face-to-face time during this encounter.  Loistine Chance, MD

## 2019-12-07 DIAGNOSIS — Z20822 Contact with and (suspected) exposure to covid-19: Secondary | ICD-10-CM | POA: Diagnosis not present

## 2019-12-15 DIAGNOSIS — M25562 Pain in left knee: Secondary | ICD-10-CM | POA: Diagnosis not present

## 2019-12-15 DIAGNOSIS — M79662 Pain in left lower leg: Secondary | ICD-10-CM | POA: Diagnosis not present

## 2019-12-15 DIAGNOSIS — M79661 Pain in right lower leg: Secondary | ICD-10-CM | POA: Diagnosis not present

## 2019-12-15 DIAGNOSIS — M25552 Pain in left hip: Secondary | ICD-10-CM | POA: Diagnosis not present

## 2019-12-15 DIAGNOSIS — M25551 Pain in right hip: Secondary | ICD-10-CM | POA: Diagnosis not present

## 2019-12-15 DIAGNOSIS — M25512 Pain in left shoulder: Secondary | ICD-10-CM | POA: Diagnosis not present

## 2019-12-15 DIAGNOSIS — M545 Low back pain: Secondary | ICD-10-CM | POA: Diagnosis not present

## 2019-12-15 DIAGNOSIS — M5431 Sciatica, right side: Secondary | ICD-10-CM | POA: Diagnosis not present

## 2019-12-15 DIAGNOSIS — M25511 Pain in right shoulder: Secondary | ICD-10-CM | POA: Diagnosis not present

## 2019-12-15 DIAGNOSIS — M25561 Pain in right knee: Secondary | ICD-10-CM | POA: Diagnosis not present

## 2019-12-15 DIAGNOSIS — M542 Cervicalgia: Secondary | ICD-10-CM | POA: Diagnosis not present

## 2019-12-15 DIAGNOSIS — M5432 Sciatica, left side: Secondary | ICD-10-CM | POA: Diagnosis not present

## 2019-12-19 ENCOUNTER — Ambulatory Visit
Admission: RE | Admit: 2019-12-19 | Discharge: 2019-12-19 | Disposition: A | Discharge status: Home / Self Care | Payer: Medicare Other | Source: Ambulatory Visit | Attending: Family Medicine | Admitting: Family Medicine

## 2019-12-19 DIAGNOSIS — Z1231 Encounter for screening mammogram for malignant neoplasm of breast: Secondary | ICD-10-CM | POA: Diagnosis not present

## 2019-12-21 IMAGING — MG MM DIGITAL SCREENING BILAT W/ TOMO W/ CAD
8 series · 8 of 24 positions shown · non-contrast
Comparison: Previous exam(s).

CLINICAL DATA: Screening.

EXAM:
DIGITAL SCREENING BILATERAL MAMMOGRAM WITH TOMO AND CAD

[L MLO synth-2D]
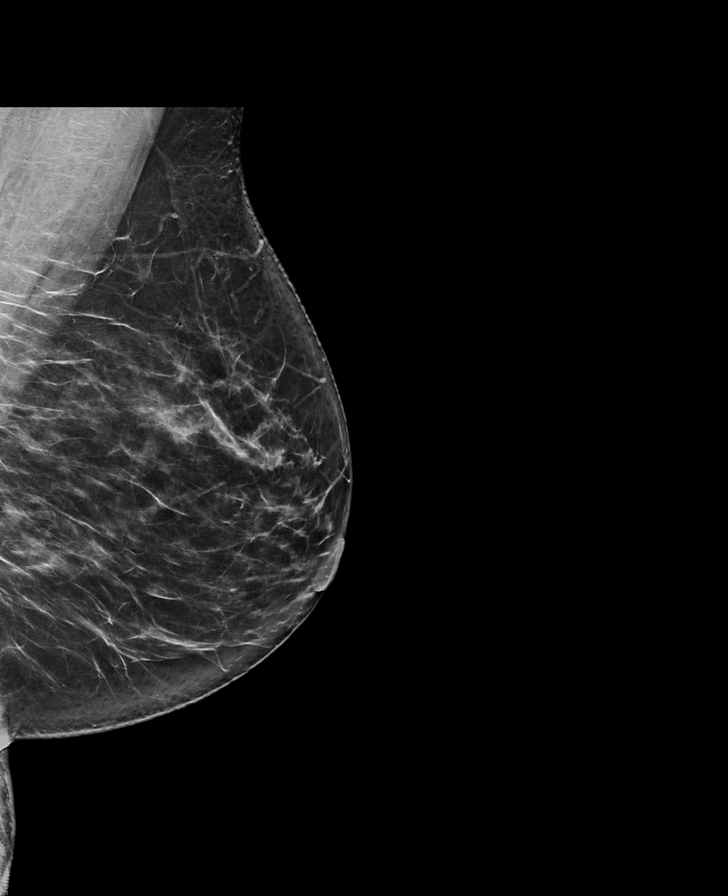

[L CC synth-2D]
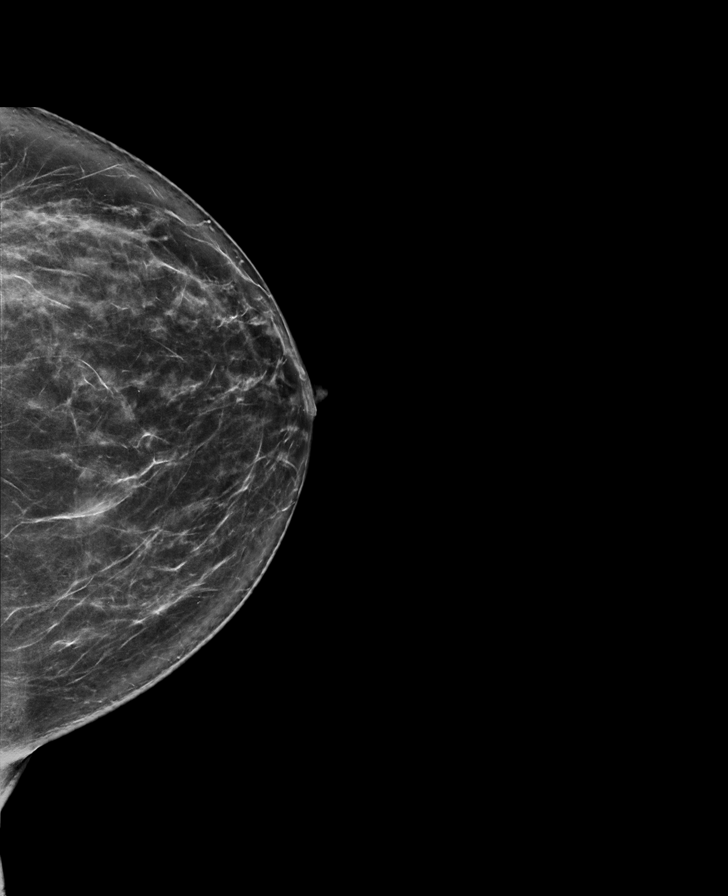

[R MLO synth-2D]
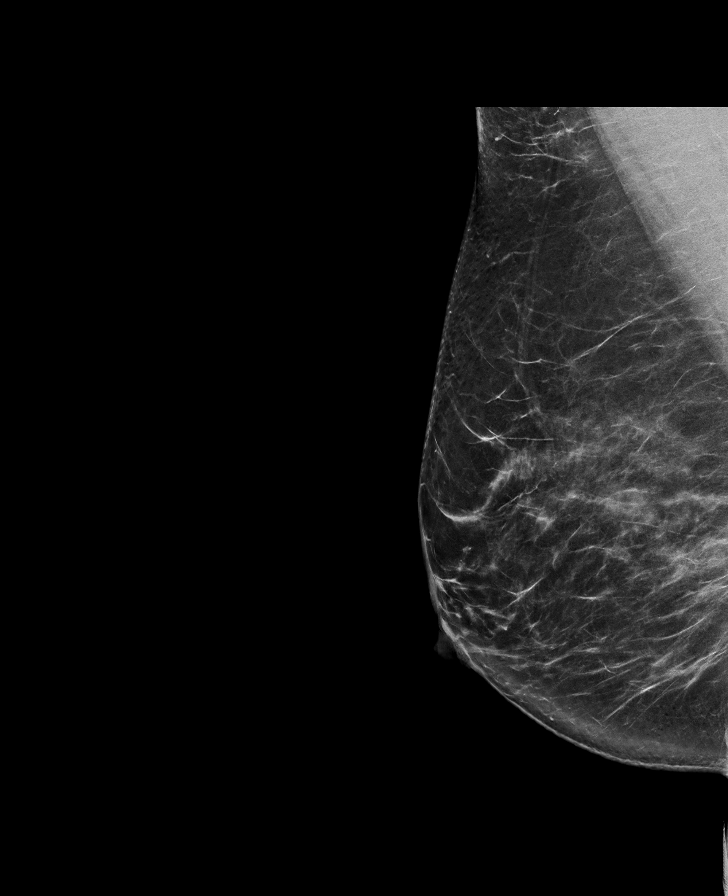

[R CC synth-2D]
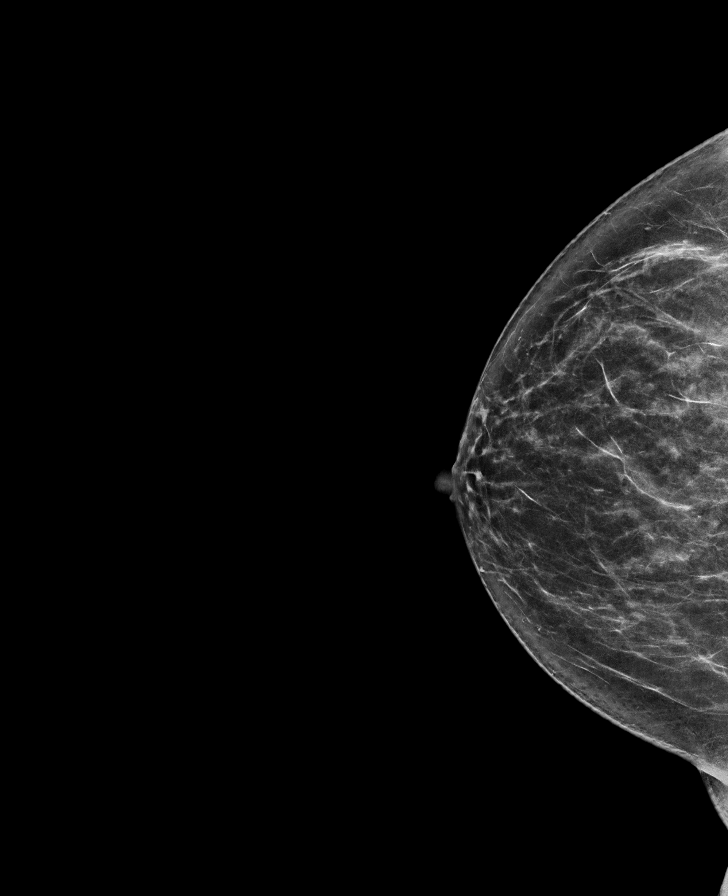

[L CC tomo · tomo slice 39/78.0]
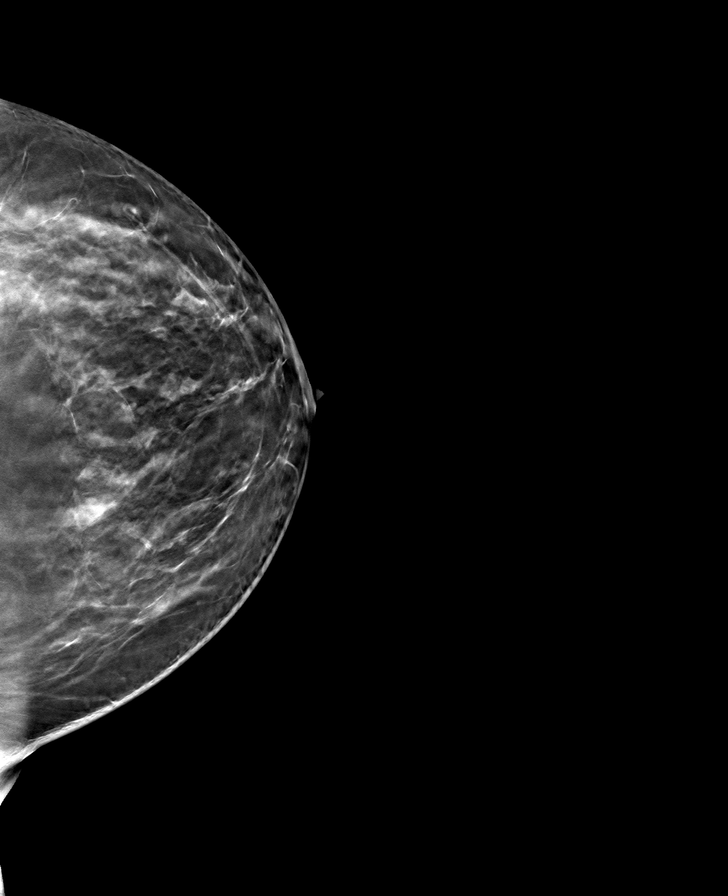

[R MLO tomo · tomo slice 43/85.0]
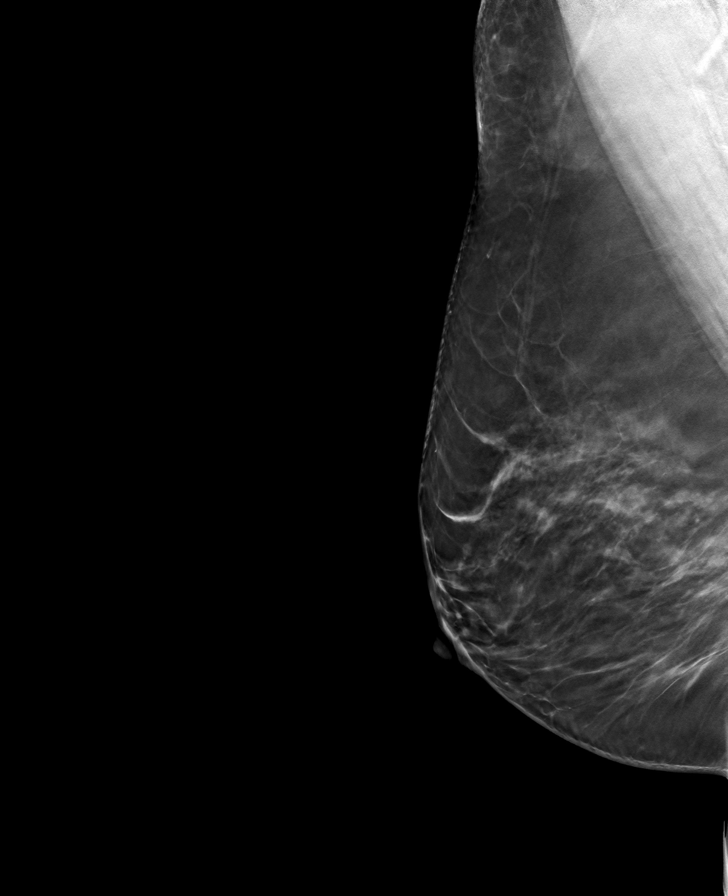

[L MLO tomo · tomo slice 41/82.0]
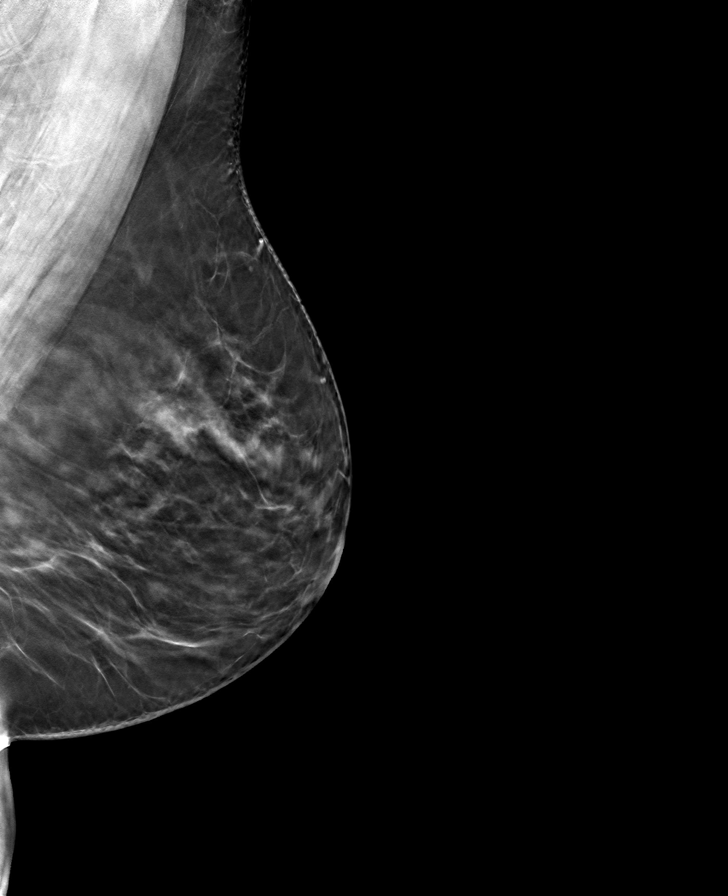

[R CC tomo · tomo slice 37/72.0]
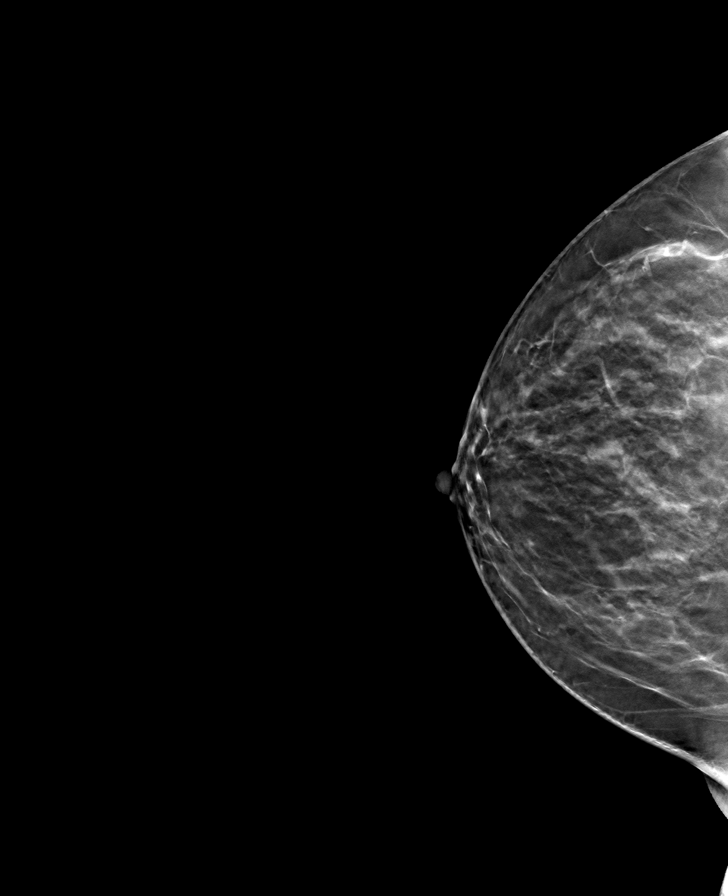

[8 of 24 positions shown; findings below may reference images not displayed]

ACR Breast Density Category b: There are scattered areas of
fibroglandular density.
FINDINGS: There are no findings suspicious for malignancy. Images were
processed with CAD.
IMPRESSION: No mammographic evidence of malignancy. A result letter of this
screening mammogram will be mailed directly to the patient.

RECOMMENDATION:
Screening mammogram in one year. (Code:[id number])

BI-RADS CATEGORY  1: Negative.

## 2019-12-26 DIAGNOSIS — Z01818 Encounter for other preprocedural examination: Secondary | ICD-10-CM | POA: Diagnosis not present

## 2019-12-26 DIAGNOSIS — M1711 Unilateral primary osteoarthritis, right knee: Secondary | ICD-10-CM | POA: Diagnosis not present

## 2019-12-27 DIAGNOSIS — Z20822 Contact with and (suspected) exposure to covid-19: Secondary | ICD-10-CM | POA: Diagnosis not present

## 2019-12-29 ENCOUNTER — Other Ambulatory Visit: Payer: Self-pay | Admitting: Family Medicine

## 2019-12-29 ENCOUNTER — Other Ambulatory Visit: Payer: Self-pay

## 2019-12-29 DIAGNOSIS — F339 Major depressive disorder, recurrent, unspecified: Secondary | ICD-10-CM

## 2019-12-29 DIAGNOSIS — F5104 Psychophysiologic insomnia: Secondary | ICD-10-CM

## 2019-12-29 DIAGNOSIS — I10 Essential (primary) hypertension: Secondary | ICD-10-CM

## 2019-12-29 NOTE — Telephone Encounter (Signed)
Refill request for general medication. Seroquel to [facility].   Last office visit 12/01/2019   Follow up on 01/12/2020

## 2019-12-31 MED ORDER — QUETIAPINE FUMARATE 25 MG PO TABS: 25.0000 mg | ORAL_TABLET | Freq: Every day | ORAL | 0 refills | Status: DC

## 2020-01-02 ENCOUNTER — Other Ambulatory Visit: Payer: Self-pay | Admitting: Family Medicine

## 2020-01-02 DIAGNOSIS — F339 Major depressive disorder, recurrent, unspecified: Secondary | ICD-10-CM

## 2020-01-02 MED ORDER — DESVENLAFAXINE SUCCINATE ER 50 MG PO TB24: 50.0000 mg | ORAL_TABLET | Freq: Every day | ORAL | 0 refills | Status: DC

## 2020-01-02 MED ORDER — OMEPRAZOLE 20 MG PO CPDR: 20.0000 mg | DELAYED_RELEASE_CAPSULE | Freq: Every day | ORAL | 0 refills | Status: DC

## 2020-01-02 NOTE — Telephone Encounter (Signed)
Medication Refill - Medication: omeprazole, pristiq  Has the patient contacted their pharmacy? No. (Agent: If no, request that the patient contact the pharmacy for the refill.) (Agent: If yes, when and what did the pharmacy advise?)  Preferred Pharmacy (with phone number or street name):  [facility], [address]  [place] [address]  Phone: [phone] Fax: [phone]  Not a 24 hour pharmacy; exact hours not known.     Agent: Please be advised that RX refills may take up to 3 business days. We ask that you follow-up with your pharmacy.

## 2020-01-02 NOTE — Telephone Encounter (Signed)
Requested Prescriptions  Pending Prescriptions Disp Refills  . omeprazole (PRILOSEC) 20 MG capsule 30 capsule 0    Sig: Take 1 capsule (20 mg total) by mouth daily.     Gastroenterology: Proton Pump Inhibitors Passed - 01/02/2020  2:11 PM      Passed - Valid encounter within last 12 months    Recent Outpatient Visits          1 month ago Fever blister   [facility] Steele Sizer, MD   5 months ago Anemia, unspecified type   [facility] Steele Sizer, MD   9 months ago Pre-op evaluation   [place], [place]   1 year ago Major depression, recurrent, chronic [facility])   [facility] Steele Sizer, MD   1 year ago Carpal tunnel syndrome on both sides   [facility] Steele Sizer, MD      Future Appointments            In 1 week Steele Sizer, MD [facility], [place]   In 7 months  [facility], [place]           . desvenlafaxine (PRISTIQ) 50 MG 24 hr tablet 30 tablet 0    Sig: Take 1 tablet (50 mg total) by mouth daily.     Psychiatry: Antidepressants - SNRI - desvenlafaxine & venlafaxine Failed - 01/02/2020  2:11 PM      Failed - LDL in normal range and within 360 days    LDL Cholesterol (Calc)  Date Value Ref Range Status  11/23/2018 38 mg/dL (calc) Final    Comment:    Reference range: <100 . Desirable range <100 mg/dL for primary prevention;   <70 mg/dL for patients with CHD or diabetic patients  with > or = 2 CHD risk factors. Marland Kitchen LDL-C is now calculated using the Martin-Hopkins  calculation, which is a validated novel method providing  better accuracy than the Friedewald equation in the  estimation of LDL-C.  Cresenciano Genre et al. Annamaria Helling. [id number]): [id number]  (http://education.[employer].com/faq/FAQ164)          Failed - Total Cholesterol in normal range and within 360 days    Cholesterol  Date Value Ref Range  Status  11/23/2018 121 <200 mg/dL Final         Failed - Triglycerides in normal range and within 360 days    Triglycerides  Date Value Ref Range Status  11/23/2018 96 <150 mg/dL Final         Failed - Last BP in normal range    BP Readings from Last 1 Encounters:  08/01/19 (!) 142/78         Passed - Completed PHQ-2 or PHQ-9 in the last 360 days.      Passed - Valid encounter within last 6 months    Recent Outpatient Visits          1 month ago Fever blister   [facility] Steele Sizer, MD   5 months ago Anemia, unspecified type   [facility] Steele Sizer, MD   9 months ago Pre-op evaluation   [place] [place]   1 year ago Major depression, recurrent, chronic [facility])   [facility] Steele Sizer, MD   1 year ago Carpal tunnel syndrome on both sides   [facility] Steele Sizer, MD  Future Appointments            In 1 week Steele Sizer, MD [facility], [place]   In 7 months  [facility], [place]

## 2020-01-03 DIAGNOSIS — Z20822 Contact with and (suspected) exposure to covid-19: Secondary | ICD-10-CM | POA: Diagnosis not present

## 2020-01-12 ENCOUNTER — Ambulatory Visit: Payer: Medicare Other | Admitting: Family Medicine

## 2020-01-12 DIAGNOSIS — M79661 Pain in right lower leg: Secondary | ICD-10-CM | POA: Diagnosis not present

## 2020-01-12 DIAGNOSIS — M5431 Sciatica, right side: Secondary | ICD-10-CM | POA: Diagnosis not present

## 2020-01-12 DIAGNOSIS — M25561 Pain in right knee: Secondary | ICD-10-CM | POA: Diagnosis not present

## 2020-01-12 DIAGNOSIS — M25511 Pain in right shoulder: Secondary | ICD-10-CM | POA: Diagnosis not present

## 2020-01-12 DIAGNOSIS — M25552 Pain in left hip: Secondary | ICD-10-CM | POA: Diagnosis not present

## 2020-01-12 DIAGNOSIS — M25512 Pain in left shoulder: Secondary | ICD-10-CM | POA: Diagnosis not present

## 2020-01-12 DIAGNOSIS — M25562 Pain in left knee: Secondary | ICD-10-CM | POA: Diagnosis not present

## 2020-01-12 DIAGNOSIS — M545 Low back pain: Secondary | ICD-10-CM | POA: Diagnosis not present

## 2020-01-12 DIAGNOSIS — M542 Cervicalgia: Secondary | ICD-10-CM | POA: Diagnosis not present

## 2020-01-12 DIAGNOSIS — M5432 Sciatica, left side: Secondary | ICD-10-CM | POA: Diagnosis not present

## 2020-01-12 DIAGNOSIS — M25551 Pain in right hip: Secondary | ICD-10-CM | POA: Diagnosis not present

## 2020-01-12 DIAGNOSIS — M79662 Pain in left lower leg: Secondary | ICD-10-CM | POA: Diagnosis not present

## 2020-01-29 ENCOUNTER — Other Ambulatory Visit: Payer: Self-pay | Admitting: Family Medicine

## 2020-01-29 DIAGNOSIS — I10 Essential (primary) hypertension: Secondary | ICD-10-CM

## 2020-01-29 DIAGNOSIS — K582 Mixed irritable bowel syndrome: Secondary | ICD-10-CM

## 2020-01-29 DIAGNOSIS — E8881 Metabolic syndrome: Secondary | ICD-10-CM

## 2020-01-29 DIAGNOSIS — F339 Major depressive disorder, recurrent, unspecified: Secondary | ICD-10-CM

## 2020-01-30 ENCOUNTER — Other Ambulatory Visit: Payer: Self-pay | Admitting: Family Medicine

## 2020-01-30 ENCOUNTER — Ambulatory Visit: Payer: Self-pay | Admitting: *Deleted

## 2020-01-30 DIAGNOSIS — Z716 Tobacco abuse counseling: Secondary | ICD-10-CM

## 2020-01-30 DIAGNOSIS — M5412 Radiculopathy, cervical region: Secondary | ICD-10-CM | POA: Diagnosis not present

## 2020-01-30 NOTE — Telephone Encounter (Signed)
lvm for scheduling °

## 2020-01-30 NOTE — Telephone Encounter (Signed)
Per initialcounter, "Pt called and stated that she has really bad neck pain. No apt available today. Pt would like advise" ; contacted pt and she states she has benn having left eck pain x 1 month; the pt says the pt radiates up to her left eye; she says it hurts to turn her head or hold her head up; her pain is rated 10 out of 10; she has tried icy hot and ibuprofen with no relief in symptoms; she would like to be seen in office; today, but there is no available appt; recommendations made per nurse triage protocol; she verbalized understanding; she is seen by Dr Ancil Boozer, [facility]; will route to the office for notification.  Reason for Disposition . Patient sounds very sick or weak to the triager  Answer Assessment - Initial Assessment Questions 1. ONSET: "When did the pain begin?"      12/31/19 2. LOCATION: "Where does it hurt?"      Left side of neck 3. PATTERN "Does the pain come and go, or has it been constant since it started?"      constnant 4. SEVERITY: "How bad is the pain?"  (Scale 1-10; or mild, moderate, severe)   - MILD (1-3): doesn't interfere with normal activities    - MODERATE (4-7): interferes with normal activities or awakens from sleep    - SEVERE (8-10):  excruciating pain, unable to do any normal activities      10 out of 10 5. RADIATION: "Does the pain go anywhere else, shoot into your arms?"     Radiates up into left eye 6. CORD SYMPTOMS: "Any weakness or numbness of the arms or legs?"    no 7. CAUSE: "What do you think is causing the neck pain?"     Originally thought was "a crook" 8. NECK OVERUSE: "Any recent activities that involved turning or twisting the neck?"     no 9. OTHER SYMPTOMS: "Do you have any other symptoms?" (e.g., headache, fever, chest pain, difficulty breathing, neck swelling)     no 10. PREGNANCY: "Is there any chance you are pregnant?" "When was your last menstrual period?"      No ablation  Protocols used: NECK PAIN OR  STIFFNESS-A-AH

## 2020-02-01 ENCOUNTER — Telehealth: Payer: Self-pay | Admitting: *Deleted

## 2020-02-01 ENCOUNTER — Other Ambulatory Visit: Payer: Self-pay | Admitting: Family Medicine

## 2020-02-01 DIAGNOSIS — Z716 Tobacco abuse counseling: Secondary | ICD-10-CM

## 2020-02-01 DIAGNOSIS — I700 Atherosclerosis of aorta: Secondary | ICD-10-CM

## 2020-02-01 DIAGNOSIS — Z01818 Encounter for other preprocedural examination: Secondary | ICD-10-CM

## 2020-02-01 DIAGNOSIS — I10 Essential (primary) hypertension: Secondary | ICD-10-CM

## 2020-02-01 DIAGNOSIS — M1711 Unilateral primary osteoarthritis, right knee: Secondary | ICD-10-CM | POA: Diagnosis not present

## 2020-02-01 DIAGNOSIS — F339 Major depressive disorder, recurrent, unspecified: Secondary | ICD-10-CM

## 2020-02-01 DIAGNOSIS — F5104 Psychophysiologic insomnia: Secondary | ICD-10-CM

## 2020-02-01 MED ORDER — OMEPRAZOLE 20 MG PO CPDR: 20.0000 mg | DELAYED_RELEASE_CAPSULE | Freq: Every day | ORAL | 0 refills | Status: DC

## 2020-02-01 NOTE — Telephone Encounter (Signed)
Pt request refill for Chantix. Is not listed on her current med list.

## 2020-02-01 NOTE — Telephone Encounter (Signed)
Requested medication (s) are due for refill today: yes  Requested medication (s) are on the active medication list: yes  Last refill:  01/02/20 for both  Future visit scheduled: yes 02/06/20  Notes to clinic:  Cardiovascular: Antilipid - Statins failed  and Psychiatry : Antidepressants - SNRI  - desvenlafaxine and venlafaxine failed  Requested Prescriptions  Pending Prescriptions Disp Refills   atorvastatin (LIPITOR) 40 MG tablet 30 tablet 5    Sig: Take 1 tablet (40 mg total) by mouth daily.      Cardiovascular:  Antilipid - Statins Failed - 02/01/2020  5:13 PM      Failed - Total Cholesterol in normal range and within 360 days    Cholesterol  Date Value Ref Range Status  11/23/2018 121 <200 mg/dL Final          Failed - LDL in normal range and within 360 days    LDL Cholesterol (Calc)  Date Value Ref Range Status  11/23/2018 38 mg/dL (calc) Final    Comment:    Reference range: <100 . Desirable range <100 mg/dL for primary prevention;   <70 mg/dL for patients with CHD or diabetic patients  with > or = 2 CHD risk factors. Marland Kitchen LDL-C is now calculated using the Martin-Hopkins  calculation, which is a validated novel method providing  better accuracy than the Friedewald equation in the  estimation of LDL-C.  Cresenciano Genre et al. Annamaria Helling. [id number]): 2061-2068  (http://education.[facility].com/faq/FAQ164)           Failed - HDL in normal range and within 360 days    HDL  Date Value Ref Range Status  11/23/2018 65 >50 mg/dL Final          Failed - Triglycerides in normal range and within 360 days    Triglycerides  Date Value Ref Range Status  11/23/2018 96 <150 mg/dL Final          Passed - Patient is not pregnant      Passed - Valid encounter within last 12 months    Recent Outpatient Visits           2 months ago Fever blister   [facility] Steele Sizer, MD   6 months ago Anemia, unspecified type   [facility]  Steele Sizer, MD   10 months ago Pre-op evaluation   [place], [place], [place]   1 year ago Major depression, recurrent, chronic [facility])   [facility] Steele Sizer, MD   1 year ago Carpal tunnel syndrome on both sides   [facility] [place], Drue Stager, MD       Future Appointments             In 5 days Steele Sizer, MD [facility], [place]   In 6 months  [facility], [facility]              desvenlafaxine (PRISTIQ) 50 MG 24 hr tablet 30 tablet 0    Sig: Take 1 tablet (50 mg total) by mouth daily.      Psychiatry: Antidepressants - SNRI - desvenlafaxine & venlafaxine Failed - 02/01/2020  5:13 PM      Failed - LDL in normal range and within 360 days    LDL Cholesterol (Calc)  Date Value Ref Range Status  11/23/2018 38 mg/dL (calc) Final    Comment:    Reference range: <100 . Desirable range <100 mg/dL for primary  prevention;   <70 mg/dL for patients with CHD or diabetic patients  with > or = 2 CHD risk factors. Marland Kitchen LDL-C is now calculated using the Martin-Hopkins  calculation, which is a validated novel method providing  better accuracy than the Friedewald equation in the  estimation of LDL-C.  Cresenciano Genre et al. Annamaria Helling. [id number]): 2061-2068  (http://education.[facility].com/faq/FAQ164)           Failed - Total Cholesterol in normal range and within 360 days    Cholesterol  Date Value Ref Range Status  11/23/2018 121 <200 mg/dL Final          Failed - Triglycerides in normal range and within 360 days    Triglycerides  Date Value Ref Range Status  11/23/2018 96 <150 mg/dL Final          Failed - Last BP in normal range    BP Readings from Last 1 Encounters:  08/01/19 (!) 142/78          Failed - Valid encounter within last 6 months    Recent Outpatient Visits           2 months ago Fever blister   [facility] Steele Sizer, MD    6 months ago Anemia, unspecified type   [facility] Steele Sizer, MD   10 months ago Pre-op evaluation   [place], FNP   1 year ago Major depression, recurrent, chronic [facility])   [facility] Steele Sizer, MD   1 year ago Carpal tunnel syndrome on both sides   [facility] Steele Sizer, MD       Future Appointments             In 5 days Steele Sizer, MD [facility], [place]   In 6 months  [place] - Completed PHQ-2 or PHQ-9 in the last 360 days.       Signed Prescriptions Disp Refills   omeprazole (PRILOSEC) 20 MG capsule 30 capsule 0    Sig: Take 1 capsule (20 mg total) by mouth daily.      Gastroenterology: Proton Pump Inhibitors Passed - 02/01/2020  5:13 PM      Passed - Valid encounter within last 12 months    Recent Outpatient Visits           2 months ago Fever blister   [facility] Steele Sizer, MD   6 months ago Anemia, unspecified type   [facility] Steele Sizer, MD   10 months ago Pre-op evaluation   [place], [place], [place]   1 year ago Major depression, recurrent, chronic [facility])   [facility] Steele Sizer, MD   1 year ago Carpal tunnel syndrome on both sides   [facility] Steele Sizer, MD       Future Appointments             In 5 days Steele Sizer, MD [facility], [place]   In 6 months  [facility], [facility]             Refused Prescriptions Disp Refills   QUEtiapine (SEROQUEL) 25 MG tablet 30 tablet 0    Sig: Take 1 tablet (25 mg total) by mouth at bedtime.      Not Delegated -  Psychiatry:  Antipsychotics - Second Generation (Atypical) - quetiapine Failed - 02/01/2020  5:13 PM      Failed - This refill cannot be delegated       Failed - ALT in normal range and within 180 days    ALT  Date Value Ref Range Status  08/01/2019 28 6 - 29 U/L Final          Failed - AST in normal range and within 180 days    AST  Date Value Ref Range Status  08/01/2019 29 10 - 35 U/L Final          Failed - Last BP in normal range    BP Readings from Last 1 Encounters:  08/01/19 (!) 142/78          Failed - Valid encounter within last 6 months    Recent Outpatient Visits           2 months ago Fever blister   [facility] Steele Sizer, MD   6 months ago Anemia, unspecified type   [facility] Steele Sizer, MD   10 months ago Pre-op evaluation   [place], FNP   1 year ago Major depression, recurrent, chronic [facility])   [facility] Steele Sizer, MD   1 year ago Carpal tunnel syndrome on both sides   [facility] Steele Sizer, MD       Future Appointments             In 5 days Steele Sizer, MD [facility], [place]   In 6 months  [place] - Completed PHQ-2 or PHQ-9 in the last 360 days.        amLODipine (NORVASC) 5 MG tablet 90 tablet 3    Sig: Take 1 tablet (5 mg total) by mouth daily.      Cardiovascular:  Calcium Channel Blockers Failed - 02/01/2020  5:13 PM      Failed - Last BP in normal range    BP Readings from Last 1 Encounters:  08/01/19 (!) 142/78          Failed - Valid encounter within last 6 months    Recent Outpatient Visits           2 months ago Fever blister   [facility] Steele Sizer, MD   6 months ago Anemia, unspecified type   [facility] Steele Sizer, MD   10 months ago Pre-op evaluation   [place], [place]   1 year ago Major depression, recurrent, chronic [facility])   [facility] Steele Sizer,  MD   1 year ago Carpal tunnel syndrome on both sides   [facility] Steele Sizer, MD       Future Appointments             In 5 days Steele Sizer, MD [facility], [place]   In 6 months  [facility], [facility]

## 2020-02-01 NOTE — Telephone Encounter (Signed)
Seroquel can not be delegated.

## 2020-02-01 NOTE — Telephone Encounter (Signed)
Copied from [place] [id number]. Topic: Quick Communication - Rx Refill/Question >> Feb 01, 2020  4:45 PM Izola Price, [place] A wrote: Medication: omeprazole (PRILOSEC) 20 MG capsule,amLODipine (NORVASC) 5 MG tablet ,atorvastatin (LIPITOR) 40 MG tablet,QUEtiapine (SEROQUEL) 25 MG tablet,CANTIX,desvenlafaxine (PRISTIQ) 50 MG 24 hr tablet,  Has the patient contacted their pharmacy? {Yes (Agent: If no, request that the patient contact the pharmacy for the refill.) (Agent: If yes, when and what did the pharmacy advise?)Contact PCP  Preferred Pharmacy (with phone number or street name): [place], [place]  Phone:  [phone] Fax:  [phone]     Agent: Please be advised that RX refills may take up to 3 business days. We ask that you follow-up with your pharmacy.

## 2020-02-02 ENCOUNTER — Other Ambulatory Visit: Payer: Self-pay | Admitting: Family Medicine

## 2020-02-02 DIAGNOSIS — F339 Major depressive disorder, recurrent, unspecified: Secondary | ICD-10-CM

## 2020-02-02 MED ORDER — DESVENLAFAXINE SUCCINATE ER 50 MG PO TB24: 50.0000 mg | ORAL_TABLET | Freq: Every day | ORAL | 0 refills | Status: DC

## 2020-02-02 MED ORDER — ATORVASTATIN CALCIUM 40 MG PO TABS: 40.0000 mg | ORAL_TABLET | Freq: Every day | ORAL | 0 refills | Status: DC

## 2020-02-02 NOTE — Telephone Encounter (Signed)
Requested Prescriptions  Pending Prescriptions Disp Refills  . omeprazole (PRILOSEC) 20 MG capsule [Pharmacy Med Name: omeprazole 20 mg capsule,delayed release] 30 capsule 0    Sig: TAKE ONE CAPSULE BY MOUTH ONCE DAILY     Gastroenterology: Proton Pump Inhibitors Passed - 02/02/2020  8:01 AM      Passed - Valid encounter within last 12 months    Recent Outpatient Visits          2 months ago Fever blister   [facility] Steele Sizer, MD   6 months ago Anemia, unspecified type   [facility] Steele Sizer, MD   10 months ago Pre-op evaluation   [place], [place]   1 year ago Major depression, recurrent, chronic [facility])   [facility] Steele Sizer, MD   1 year ago Carpal tunnel syndrome on both sides   [facility]izer, MD      Future Appointments            In 4 days Steele Sizer, MD [facility], [place]   In 6 months  [facility], [place]           . desvenlafaxine (PRISTIQ) 50 MG 24 hr tablet [Pharmacy Med Name: desvenlafaxine succinate ER 50 mg tablet,extended release 24 hr] 30 tablet 0    Sig: TAKE ONE TABLET BY MOUTH ONCE DAILY     Psychiatry: Antidepressants - SNRI - desvenlafaxine & venlafaxine Failed - 02/02/2020  8:01 AM      Failed - LDL in normal range and within 360 days    LDL Cholesterol (Calc)  Date Value Ref Range Status  11/23/2018 38 mg/dL (calc) Final    Comment:    Reference range: <100 . Desirable range <100 mg/dL for primary prevention;   <70 mg/dL for patients with CHD or diabetic patients  with > or = 2 CHD risk factors. Marland Kitchen LDL-C is now calculated using the Martin-Hopkins  calculation, which is a validated novel method providing  better accuracy than the Friedewald equation in the  estimation of LDL-C.  Cresenciano Genre et al. Annamaria Helling. [id number]): 2061-2068   (http://education.[facility].com/faq/FAQ164)          Failed - Total Cholesterol in normal range and within 360 days    Cholesterol  Date Value Ref Range Status  11/23/2018 121 <200 mg/dL Final         Failed - Triglycerides in normal range and within 360 days    Triglycerides  Date Value Ref Range Status  11/23/2018 96 <150 mg/dL Final         Failed - Last BP in normal range    BP Readings from Last 1 Encounters:  08/01/19 (!) 142/78         Failed - Valid encounter within last 6 months    Recent Outpatient Visits          2 months ago Fever blister   [facility] Steele Sizer, MD   6 months ago Anemia, unspecified type   [facility] Steele Sizer, MD   10 months ago Pre-op evaluation   [place], FNP   1 year ago Major depression, recurrent, chronic [facility])   [facility] Steele Sizer, MD   1 year ago Carpal tunnel syndrome on both sides   [facility] Steele Sizer, MD  Future Appointments            In 4 days Steele Sizer, MD [facility], [place]   In 6 months  [place] - Completed PHQ-2 or PHQ-9 in the last 360 days.

## 2020-02-02 NOTE — Telephone Encounter (Signed)
Requested Prescriptions  Pending Prescriptions Disp Refills  . desvenlafaxine (PRISTIQ) 50 MG 24 hr tablet [Pharmacy Med Name: desvenlafaxine succinate ER 50 mg tablet,extended release 24 hr] 30 tablet 0    Sig: TAKE ONE TABLET BY MOUTH ONCE DAILY     Psychiatry: Antidepressants - SNRI - desvenlafaxine & venlafaxine Failed - 02/02/2020  8:19 AM      Failed - LDL in normal range and within 360 days    LDL Cholesterol (Calc)  Date Value Ref Range Status  11/23/2018 38 mg/dL (calc) Final    Comment:    Reference range: <100 . Desirable range <100 mg/dL for primary prevention;   <70 mg/dL for patients with CHD or diabetic patients  with > or = 2 CHD risk factors. Marland Kitchen LDL-C is now calculated using the Martin-Hopkins  calculation, which is a validated novel method providing  better accuracy than the Friedewald equation in the  estimation of LDL-C.  Cresenciano Genre et al. Annamaria Helling. [id number]): 2061-2068  (http://education.[facility].com/faq/FAQ164)          Failed - Total Cholesterol in normal range and within 360 days    Cholesterol  Date Value Ref Range Status  11/23/2018 121 <200 mg/dL Final         Failed - Triglycerides in normal range and within 360 days    Triglycerides  Date Value Ref Range Status  11/23/2018 96 <150 mg/dL Final         Failed - Last BP in normal range    BP Readings from Last 1 Encounters:  08/01/19 (!) 142/78         Failed - Valid encounter within last 6 months    Recent Outpatient Visits          2 months ago Fever blister   [facility] Steele Sizer, MD   6 months ago Anemia, unspecified type   [place] Steele Sizer, MD   10 months ago Pre-op evaluation   [place], FNP   1 year ago Major depression, recurrent, chronic [facility])   [facility] Steele Sizer, MD   1 year ago Carpal tunnel syndrome on both sides   [facility]  Steele Sizer, MD      Future Appointments            In 4 days Steele Sizer, MD [facility], [place]   In 6 months  [place] - Completed PHQ-2 or PHQ-9 in the last 360 days.      Signed Prescriptions Disp Refills   omeprazole (PRILOSEC) 20 MG capsule 30 capsule 0    Sig: TAKE ONE CAPSULE BY MOUTH ONCE DAILY     Gastroenterology: Proton Pump Inhibitors Passed - 02/02/2020  8:01 AM      Passed - Valid encounter within last 12 months    Recent Outpatient Visits          2 months ago Fever blister   [facility] Steele Sizer, MD   6 months ago Anemia, unspecified type   [facility] Steele Sizer, MD   10 months ago Pre-op evaluation   [place], [place]   1 year ago Major depression, recurrent, chronic [facility])   [facility] Steele Sizer, MD   1 year ago Carpal tunnel syndrome on both sides  [facility] Steele Sizer, MD      Future Appointments            In 4 days Steele Sizer, MD [facility], [facility]   In 6 months  [facility], [facility]

## 2020-02-06 ENCOUNTER — Encounter: Payer: Self-pay | Admitting: Family Medicine

## 2020-02-06 ENCOUNTER — Ambulatory Visit (INDEPENDENT_AMBULATORY_CARE_PROVIDER_SITE_OTHER): Payer: Medicare Other | Admitting: Family Medicine

## 2020-02-06 ENCOUNTER — Other Ambulatory Visit: Payer: Self-pay

## 2020-02-06 VITALS — BP 138/76 | HR 84 | Temp 96.6°F | Resp 18 | Ht 69.0 in | Wt 198.2 lb

## 2020-02-06 DIAGNOSIS — J449 Chronic obstructive pulmonary disease, unspecified: Secondary | ICD-10-CM | POA: Diagnosis not present

## 2020-02-06 DIAGNOSIS — F339 Major depressive disorder, recurrent, unspecified: Secondary | ICD-10-CM

## 2020-02-06 DIAGNOSIS — E8881 Metabolic syndrome: Secondary | ICD-10-CM | POA: Diagnosis not present

## 2020-02-06 DIAGNOSIS — Z87891 Personal history of nicotine dependence: Secondary | ICD-10-CM | POA: Diagnosis not present

## 2020-02-06 DIAGNOSIS — I700 Atherosclerosis of aorta: Secondary | ICD-10-CM | POA: Diagnosis not present

## 2020-02-06 DIAGNOSIS — F5104 Psychophysiologic insomnia: Secondary | ICD-10-CM | POA: Diagnosis not present

## 2020-02-06 DIAGNOSIS — K582 Mixed irritable bowel syndrome: Secondary | ICD-10-CM

## 2020-02-06 DIAGNOSIS — Z20822 Contact with and (suspected) exposure to covid-19: Secondary | ICD-10-CM | POA: Diagnosis not present

## 2020-02-06 DIAGNOSIS — I10 Essential (primary) hypertension: Secondary | ICD-10-CM

## 2020-02-06 DIAGNOSIS — M5412 Radiculopathy, cervical region: Secondary | ICD-10-CM | POA: Diagnosis not present

## 2020-02-06 MED ORDER — QUETIAPINE FUMARATE 25 MG PO TABS: 25.0000 mg | ORAL_TABLET | Freq: Every day | ORAL | 1 refills | Status: DC

## 2020-02-06 MED ORDER — AMITRIPTYLINE HCL 10 MG PO TABS: 10.0000 mg | ORAL_TABLET | Freq: Every day | ORAL | 1 refills | Status: DC

## 2020-02-06 MED ORDER — ATORVASTATIN CALCIUM 40 MG PO TABS: 40.0000 mg | ORAL_TABLET | Freq: Every day | ORAL | 1 refills | Status: DC

## 2020-02-06 MED ORDER — METFORMIN HCL 500 MG PO TABS: 500.0000 mg | ORAL_TABLET | Freq: Every day | ORAL | 1 refills | Status: DC

## 2020-02-06 MED ORDER — CHANTIX CONTINUING MONTH PAK 1 MG PO TABS: 1.0000 mg | ORAL_TABLET | Freq: Two times a day (BID) | ORAL | 1 refills | Status: DC

## 2020-02-06 MED ORDER — DESVENLAFAXINE SUCCINATE ER 50 MG PO TB24: 50.0000 mg | ORAL_TABLET | Freq: Every day | ORAL | 1 refills | Status: DC

## 2020-02-06 MED ORDER — AMLODIPINE BESYLATE 5 MG PO TABS: 5.0000 mg | ORAL_TABLET | Freq: Every day | ORAL | 1 refills | Status: DC

## 2020-02-06 MED ORDER — OMEPRAZOLE 20 MG PO CPDR: 20.0000 mg | DELAYED_RELEASE_CAPSULE | Freq: Every day | ORAL | 0 refills | Status: DC

## 2020-02-06 NOTE — Progress Notes (Addendum)
Name: [name]   MRN: [id number]    DOB: [birth year]   Date:02/06/2020       Progress Note  Subjective  Chief Complaint  Chief Complaint  Patient presents with  . Medication Refill  . Depression  . Hypertension    Denies any symptoms  . COPD  . Irritable Bowel Syndrome  . Neck Pain    Onset- 3 weeks, constant posterior neck pain See Ortho on 01/25/2020 for neck pain-x ray was nml- given Prednisone and muscle relaxer-finished prednisone and pain is back- seen neurosurgeon at [facility]-told her to finish prednisone.    HPI    OA both knees: she has a TKA of left knee 04/2019 by Dr. Harlow Mares. She is going back for TKR on right side June 16 th, 2021. Left knee is good, occasionally has a mild pain, right side is always swollen, aching and instability   Metabolic syndrome:weighthas gone down 120lbs since 11/2018. She is still taking Metformin. She denies polyphagia, polydipsia or polyuria. Continue life style modification.Last hgbA1C was normal. She states no diarrhea, but has lack of appetite - she states worse since acute neck pain   HTN:she has not been checking her bp at home lately. No chest pain or palpitation. BP today is at goal. Continue medications   Depression Major:long history of depression, got worse when mother died in 12/11/2016, but seems to be doing okay at this time, Still medications as prescribed, denies suicidal thoughts or ideation. Phq 9 is down from 11 to 3   COPD:She quit smoking 04/2019 She denies any wheezing, cough or SOB at this time. Unchanged   IBS: doing well at this time, pain under control, she has diarrhea and constipation type No recent flares, doing well at this time   Acute on chronic neck pain with radiculitis: she woke up with left side neck pain about 3 weeks ago, she went to [facility] and was given a prednisone taper and continue muscle relaxer, followed up with neurosurgeon - Dr. Sharyne Richters and advised to finish prednisone taper but  not to have repeat surgery at this time. She states she still has pain. Advised her to contact him again since no improvement with prednisone taper   Patient Active Problem List   Diagnosis Date Noted  . S/P TKR (total knee replacement) using cement, left 04/12/2019  . Overweight (BMI 25.0-29.9) 04/06/2019  . Peptic ulcer   . Benign neoplasm of ascending colon   . Diverticulosis of large intestine without diverticulitis   . Simple chronic bronchitis ([place]) 08/03/2018  . Pancreatic lesion 04/21/2018  . Abdominal aortic atherosclerosis ([place]) 04/21/2018  . Acute peptic ulcer of stomach   . Gastric polyp   . Abdominal pain, epigastric   . Trigger thumb of right hand 02/25/2018  . Carpal tunnel syndrome on both sides 10/21/2017  . CRP elevated 08/09/2017  . Elevated C-reactive protein 07/28/2017  . Osteoarthritis of both knees 02/19/2016  . COPD, mild ([place]) 05/08/2015  . Vitamin D deficiency 05/07/2015  . Metabolic syndrome 40/98/1191  . Eczema 05/07/2015  . Chronic insomnia 05/07/2015  . Hypertension, benign 05/07/2015  . Dyslipidemia 05/07/2015  . Hyperglycemia 05/07/2015  . Chronic low back pain 05/07/2015  . IBS (irritable bowel syndrome) 05/07/2015  . Gastroesophageal reflux disease without esophagitis 05/07/2015  . Major depression, chronic ([place]) 05/07/2015  . Menopausal syndrome (hot flashes) 05/07/2015  . History of postmenopausal bleeding 01/14/2015    Past Surgical History:  Procedure Laterality Date  . BACK SURGERY    .  BREAST BIOPSY Left    neg  . BREAST LUMPECTOMY Left   . CARPAL TUNNEL RELEASE Left 08/15/2018   Procedure: CARPAL TUNNEL RELEASE;  Surgeon: Earnestine Leys, MD;  Location: [facility];  Service: Orthopedics;  Laterality: Left;  . CERVICAL DISCECTOMY     x 2; metal plate  . CHOLECYSTECTOMY    . COLONOSCOPY WITH PROPOFOL N/A 08/08/2018   Procedure: COLONOSCOPY WITH PROPOFOL;  Surgeon: Virgel Manifold, MD;  Location: [facility];  Service:  Endoscopy;  Laterality: N/A;  . DILATATION & CURETTAGE/HYSTEROSCOPY WITH MYOSURE N/A 03/21/2015   Procedure: DILATATION & CURETTAGE/HYSTEROSCOPY;  Surgeon: Aletha Halim, MD;  Location: [facility];  Service: Gynecology;  Laterality: N/A;  . DILATION AND CURETTAGE OF UTERUS    . ENDOMETRIAL ABLATION    . ESOPHAGOGASTRODUODENOSCOPY (EGD) WITH PROPOFOL N/A 04/13/2018   Procedure: ESOPHAGOGASTRODUODENOSCOPY (EGD) WITH PROPOFOL;  Surgeon: Virgel Manifold, MD;  Location: [facility];  Service: Endoscopy;  Laterality: N/A;  . ESOPHAGOGASTRODUODENOSCOPY (EGD) WITH PROPOFOL N/A 08/08/2018   Procedure: ESOPHAGOGASTRODUODENOSCOPY (EGD) WITH PROPOFOL;  Surgeon: Virgel Manifold, MD;  Location: [facility];  Service: Endoscopy;  Laterality: N/A;  . SPINAL FUSION     lumbar x2  . TONSILLECTOMY    . TOTAL KNEE ARTHROPLASTY Left 04/12/2019   Procedure: TOTAL KNEE ARTHROPLASTY;  Surgeon: Lovell Sheehan, MD;  Location: [facility];  Service: Orthopedics;  Laterality: Left;  . TUBAL LIGATION      Family History  Problem Relation Age of Onset  . Heart disease Mother   . Thyroid cancer Mother   . Depression Daughter   . Asthma Daughter   . Breast cancer Maternal Grandmother     Social History   Tobacco Use  . Smoking status: Former Smoker    Packs/day: 0.25    Years: 20.00    Pack years: 5.00    Types: Cigarettes    Start date: 07/26/2002    Quit date: 04/06/2019    Years since quitting: 0.8  . Smokeless tobacco: Never Used  . Tobacco comment: Quit Date 04/06/2019  Substance Use Topics  . Alcohol use: No    Alcohol/week: 0.0 standard drinks     Current Outpatient Medications:  .  amitriptyline (ELAVIL) 10 MG tablet, Take 1 tablet (10 mg total) by mouth daily. For IBS, Disp: 90 tablet, Rfl: 1 .  amLODipine (NORVASC) 5 MG tablet, Take 1 tablet (5 mg total) by mouth daily., Disp: 90 tablet, Rfl: 1 .  atorvastatin (LIPITOR) 40 MG tablet, Take 1 tablet (40 mg total) by mouth daily., Disp:  90 tablet, Rfl: 1 .  CHANTIX CONTINUING MONTH PAK 1 MG tablet, Take 1 tablet (1 mg total) by mouth 2 (two) times daily., Disp: 180 tablet, Rfl: 1 .  cholecalciferol (VITAMIN D) 1000 units tablet, Take 1,000 Units by mouth daily., Disp: , Rfl:  .  desvenlafaxine (PRISTIQ) 50 MG 24 hr tablet, Take 1 tablet (50 mg total) by mouth daily., Disp: 90 tablet, Rfl: 1 .  gabapentin (NEURONTIN) 300 MG capsule, TAKE ONE CAPSULE BY MOUTH EVERY TWELVE HOURS, Disp: , Rfl:  .  losartan-hydrochlorothiazide (HYZAAR) 100-25 MG tablet, TAKE ONE TABLET BY MOUTH ONCE DAILY, Disp: 90 tablet, Rfl: 1 .  meloxicam (MOBIC) 15 MG tablet, Take 1 tablet (15 mg total) by mouth daily., Disp: 30 tablet, Rfl: 0 .  metFORMIN (GLUCOPHAGE) 500 MG tablet, Take 1 tablet (500 mg total) by mouth daily with breakfast., Disp: 90 tablet, Rfl: 1 .  NARCAN 4 MG/0.1ML LIQD nasal spray  kit, OPIOD EMERGENCY 1 SPRAY IN 1 NOSTRIL 1 TIME, MAY REPEAT IN OTHER NOSTRIL EVERY 2 - 3 MINS UNTIL RESPONSIVE /EMS ARRIVES, Disp: , Rfl:  .  omeprazole (PRILOSEC) 20 MG capsule, Take 1 capsule (20 mg total) by mouth daily., Disp: 90 capsule, Rfl: 0 .  Oxycodone HCl 20 MG TABS, Take 20 mg by mouth every 4 (four) hours., Disp: , Rfl:  .  QUEtiapine (SEROQUEL) 25 MG tablet, Take 1 tablet (25 mg total) by mouth at bedtime., Disp: 90 tablet, Rfl: 1 .  tiZANidine (ZANAFLEX) 4 MG tablet, Take 4 mg by mouth every 12 (twelve) hours. , Disp: , Rfl:  .  valACYclovir (VALTREX) 1000 MG tablet, Take 1 tablet (1,000 mg total) by mouth 2 (two) times daily. Prn outbreak, Disp: 6 tablet, Rfl: 0  No Known Allergies  I personally reviewed active problem list, medication list, allergies, family history, social history, health maintenance with the patient/caregiver today.   ROS  Constitutional: Negative for fever or weight change.  Respiratory: Negative for cough and shortness of breath.   Cardiovascular: Negative for chest pain or palpitations.  Gastrointestinal: Negative for  abdominal pain, no bowel changes.  Musculoskeletal: Positive  for gait problem and  joint swelling.  Skin: Negative for rash.  Neurological: Negative for dizziness or headache.  No other specific complaints in a complete review of systems (except as listed in HPI above).  Objective  Vitals:   02/06/20 1151  BP: 138/76  Pulse: 84  Resp: 18  Temp: (!) 96.6 F (35.9 C)  TempSrc: Temporal  SpO2: 97%  Weight: 198 lb 3.2 oz (89.9 kg)  Height: _0  (1.753 m)    Body mass index is 29.27 kg/m.  Physical Exam  Constitutional: Patient appears well-developed and well-nourished.  No distress.  HEENT: head atraumatic, normocephalic, pupils equal and reactive to light, neck very tender to palpation and muscle spasms on left side Cardiovascular: Normal rate, regular rhythm and normal heart sounds.  No murmur heard. No BLE edema. Pulmonary/Chest: Effort normal and breath sounds normal. No respiratory distress. Abdominal: Soft.  There is no tenderness. Psychiatric: Patient has a normal mood and affect. behavior is normal. Judgment and thought content normal.  PHQ2/9: Depression screen [facility] 2/9 02/06/2020 12/01/2019 08/01/2019 07/13/2019 04/06/2019  Decreased Interest 0 2 0 0 0  Down, Depressed, Hopeless 1 0 0 0 0  PHQ - 2 Score 1 2 0 0 0  Altered sleeping 1 3 - 3 0  Tired, decreased energy 1 1 - 1 0  Change in appetite 0 3 - 0 0  Feeling bad or failure about yourself  0 2 - 0 0  Trouble concentrating 0 0 - 0 0  Moving slowly or fidgety/restless 0 0 - 0 0  Suicidal thoughts 0 0 - 0 0  PHQ-9 Score 3 11 - 4 0  Difficult doing work/chores Not difficult at all Not difficult at all - Not difficult at all Not difficult at all  Some recent data might be hidden    phq 9 is positive   Fall Risk: Fall Risk  02/06/2020 08/01/2019 07/13/2019 04/06/2019 11/23/2018  Falls in the past year? _1 0 0  Number falls in past yr: 0 1 0 0 -  Injury with Fall? 0 0 0 0 -  Risk for fall due to : - History of  fall(s) - - -  Risk for fall due to: Comment - - - - -  Follow up - Falls prevention  discussed - Falls evaluation completed -     Functional Status Survey: Is the patient deaf or have difficulty hearing?: No Does the patient have difficulty seeing, even when wearing glasses/contacts?: No Does the patient have difficulty concentrating, remembering, or making decisions?: No Does the patient have difficulty walking or climbing stairs?: No Does the patient have difficulty dressing or bathing?: No Does the patient have difficulty doing errands alone such as visiting a doctor's office or shopping?: No    Assessment & Plan  1. Chronic insomnia  - QUEtiapine (SEROQUEL) 25 MG tablet; Take 1 tablet (25 mg total) by mouth at bedtime.  Dispense: 90 tablet; Refill: 1  2. Major depression, recurrent, chronic (HCC)  - QUEtiapine (SEROQUEL) 25 MG tablet; Take 1 tablet (25 mg total) by mouth at bedtime.  Dispense: 90 tablet; Refill: 1 - desvenlafaxine (PRISTIQ) 50 MG 24 hr tablet; Take 1 tablet (50 mg total) by mouth daily.  Dispense: 90 tablet; Refill: 1  3. Insulin resistance  - metFORMIN (GLUCOPHAGE) 500 MG tablet; Take 1 tablet (500 mg total) by mouth daily with breakfast.  Dispense: 90 tablet; Refill: 1  4. Metabolic syndrome  - metFORMIN (GLUCOPHAGE) 500 MG tablet; Take 1 tablet (500 mg total) by mouth daily with breakfast.  Dispense: 90 tablet; Refill: 1  5. Abdominal aortic atherosclerosis (HCC)  - atorvastatin (LIPITOR) 40 MG tablet; Take 1 tablet (40 mg total) by mouth daily.  Dispense: 90 tablet; Refill: 1 Advised to resume aspirin 81 mg  6. Irritable bowel syndrome with both constipation and diarrhea  - amitriptyline (ELAVIL) 10 MG tablet; Take 1 tablet (10 mg total) by mouth daily. For IBS  Dispense: 90 tablet; Refill: 1  7. Hypertension, benign  - amLODipine (NORVASC) 5 MG tablet; Take 1 tablet (5 mg total) by mouth daily.  Dispense: 90 tablet; Refill: 1  8. COPD, mild  ([place])  Doing well   9. History of tobacco use  - CHANTIX CONTINUING MONTH PAK 1 MG tablet; Take 1 tablet (1 mg total) by mouth 2 (two) times daily.  Dispense: 180 tablet; Refill: 1

## 2020-02-09 DIAGNOSIS — M545 Low back pain: Secondary | ICD-10-CM | POA: Diagnosis not present

## 2020-02-09 DIAGNOSIS — M25552 Pain in left hip: Secondary | ICD-10-CM | POA: Diagnosis not present

## 2020-02-09 DIAGNOSIS — M542 Cervicalgia: Secondary | ICD-10-CM | POA: Diagnosis not present

## 2020-02-09 DIAGNOSIS — M5432 Sciatica, left side: Secondary | ICD-10-CM | POA: Diagnosis not present

## 2020-02-09 DIAGNOSIS — M79661 Pain in right lower leg: Secondary | ICD-10-CM | POA: Diagnosis not present

## 2020-02-09 DIAGNOSIS — M25511 Pain in right shoulder: Secondary | ICD-10-CM | POA: Diagnosis not present

## 2020-02-09 DIAGNOSIS — M25551 Pain in right hip: Secondary | ICD-10-CM | POA: Diagnosis not present

## 2020-02-09 DIAGNOSIS — M25512 Pain in left shoulder: Secondary | ICD-10-CM | POA: Diagnosis not present

## 2020-02-09 DIAGNOSIS — M5431 Sciatica, right side: Secondary | ICD-10-CM | POA: Diagnosis not present

## 2020-02-09 DIAGNOSIS — M25561 Pain in right knee: Secondary | ICD-10-CM | POA: Diagnosis not present

## 2020-02-09 DIAGNOSIS — M25562 Pain in left knee: Secondary | ICD-10-CM | POA: Diagnosis not present

## 2020-02-09 DIAGNOSIS — M79662 Pain in left lower leg: Secondary | ICD-10-CM | POA: Diagnosis not present

## 2020-02-26 ENCOUNTER — Telehealth: Payer: Self-pay | Admitting: Family Medicine

## 2020-02-26 NOTE — Chronic Care Management (AMB) (Signed)
Chronic Care Management   Note  02/26/2020 Name: SHARMEL BALLANTINE MRN: [id number] DOB: 1961-01-08  BERNIECE ABID is a 59 y.o. year old female who is a primary care patient of Steele Sizer, MD. I reached out to [name] by phone today in response to a referral sent by Ms. Montine Circle Baldyga's health plan.     Ms. Ramanathan was given information about Chronic Care Management services today including:  1. CCM service includes personalized support from designated clinical staff supervised by her physician, including individualized plan of care and coordination with other care providers 2. 24/7 contact phone numbers for assistance for urgent and routine care needs. 3. Service will only be billed when office clinical staff spend 20 minutes or more in a month to coordinate care. 4. Only one practitioner may furnish and bill the service in a calendar month. 5. The patient may stop CCM services at any time (effective at the end of the month) by phone call to the office staff. 6. The patient will be responsible for cost sharing (co-pay) of up to 20% of the service fee (after annual deductible is met).  Patient did not agree to enrollment in care management services and does not wish to consider at this time.  Follow up plan: The patient has been provided with contact information for the care management team and has been advised to call with any health related questions or concerns.   Noreene Larsson, [address], [place], [place] [address] Direct Dial: [phone] [email or web address]: [email or web address]

## 2020-03-07 DIAGNOSIS — Z03818 Encounter for observation for suspected exposure to other biological agents ruled out: Secondary | ICD-10-CM | POA: Diagnosis not present

## 2020-03-12 DIAGNOSIS — M79662 Pain in left lower leg: Secondary | ICD-10-CM | POA: Diagnosis not present

## 2020-03-12 DIAGNOSIS — M25511 Pain in right shoulder: Secondary | ICD-10-CM | POA: Diagnosis not present

## 2020-03-12 DIAGNOSIS — M25561 Pain in right knee: Secondary | ICD-10-CM | POA: Diagnosis not present

## 2020-03-12 DIAGNOSIS — M25551 Pain in right hip: Secondary | ICD-10-CM | POA: Diagnosis not present

## 2020-03-12 DIAGNOSIS — M25552 Pain in left hip: Secondary | ICD-10-CM | POA: Diagnosis not present

## 2020-03-12 DIAGNOSIS — M5432 Sciatica, left side: Secondary | ICD-10-CM | POA: Diagnosis not present

## 2020-03-12 DIAGNOSIS — M79661 Pain in right lower leg: Secondary | ICD-10-CM | POA: Diagnosis not present

## 2020-03-12 DIAGNOSIS — M25562 Pain in left knee: Secondary | ICD-10-CM | POA: Diagnosis not present

## 2020-03-12 DIAGNOSIS — M5431 Sciatica, right side: Secondary | ICD-10-CM | POA: Diagnosis not present

## 2020-03-12 DIAGNOSIS — M25512 Pain in left shoulder: Secondary | ICD-10-CM | POA: Diagnosis not present

## 2020-03-12 DIAGNOSIS — M545 Low back pain: Secondary | ICD-10-CM | POA: Diagnosis not present

## 2020-03-12 DIAGNOSIS — M542 Cervicalgia: Secondary | ICD-10-CM | POA: Diagnosis not present

## 2020-03-13 DIAGNOSIS — Z23 Encounter for immunization: Secondary | ICD-10-CM | POA: Diagnosis not present

## 2020-04-03 DIAGNOSIS — Z23 Encounter for immunization: Secondary | ICD-10-CM | POA: Diagnosis not present

## 2020-04-08 ENCOUNTER — Other Ambulatory Visit: Payer: Self-pay | Admitting: Orthopedic Surgery

## 2020-04-09 ENCOUNTER — Other Ambulatory Visit: Payer: Self-pay

## 2020-04-09 ENCOUNTER — Other Ambulatory Visit: Payer: Self-pay | Admitting: Family Medicine

## 2020-04-09 ENCOUNTER — Encounter
Admission: RE | Admit: 2020-04-09 | Discharge: 2020-04-09 | Disposition: A | Discharge status: Home / Self Care | Payer: Medicare Other | Source: Ambulatory Visit | Attending: Orthopedic Surgery | Admitting: Orthopedic Surgery

## 2020-04-09 DIAGNOSIS — Z87891 Personal history of nicotine dependence: Secondary | ICD-10-CM | POA: Insufficient documentation

## 2020-04-09 DIAGNOSIS — Z79899 Other long term (current) drug therapy: Secondary | ICD-10-CM | POA: Insufficient documentation

## 2020-04-09 DIAGNOSIS — E785 Hyperlipidemia, unspecified: Secondary | ICD-10-CM | POA: Diagnosis not present

## 2020-04-09 DIAGNOSIS — B001 Herpesviral vesicular dermatitis: Secondary | ICD-10-CM

## 2020-04-09 DIAGNOSIS — K219 Gastro-esophageal reflux disease without esophagitis: Secondary | ICD-10-CM | POA: Insufficient documentation

## 2020-04-09 DIAGNOSIS — Z01818 Encounter for other preprocedural examination: Secondary | ICD-10-CM | POA: Insufficient documentation

## 2020-04-09 DIAGNOSIS — I10 Essential (primary) hypertension: Secondary | ICD-10-CM | POA: Diagnosis not present

## 2020-04-09 DIAGNOSIS — Z7982 Long term (current) use of aspirin: Secondary | ICD-10-CM | POA: Insufficient documentation

## 2020-04-09 LAB — BASIC METABOLIC PANEL
Anion gap: 9 (ref 5–15)
BUN: 18 mg/dL (ref 6–20)
CO2: 27 mmol/L (ref 22–32)
Calcium: 9.5 mg/dL (ref 8.9–10.3)
Chloride: 103 mmol/L (ref 98–111)
Creatinine, Ser: 0.73 mg/dL (ref 0.44–1.00)
GFR calc Af Amer: >60 mL/min (ref >60)
GFR calc non Af Amer: >60 mL/min (ref >60)
Glucose, Bld: 92 mg/dL (ref 70–99)
Potassium: 3.4 mmol/L — ABNORMAL LOW (ref 3.5–5.1)
Sodium: 139 mmol/L (ref 135–145)

## 2020-04-09 LAB — SURGICAL PCR SCREEN
MRSA, PCR: NEGATIVE
Staphylococcus aureus: NEGATIVE

## 2020-04-09 LAB — CBC
HCT: 37.4 % (ref 36.0–46.0)
Hemoglobin: 12.3 g/dL (ref 12.0–15.0)
MCH: 31.3 pg (ref 26.0–34.0)
MCHC: 32.9 g/dL (ref 30.0–36.0)
MCV: 95.2 fL (ref 80.0–100.0)
Platelets: 365 10*3/uL (ref 150–400)
RBC: 3.93 MIL/uL (ref 3.87–5.11)
RDW: 12.8 % (ref 11.5–15.5)
WBC: 8.7 10*3/uL (ref 4.0–10.5)
nRBC: 0 % (ref 0.0–0.2)

## 2020-04-09 LAB — URINALYSIS, ROUTINE W REFLEX MICROSCOPIC
Bacteria, UA: NONE SEEN
Bilirubin Urine: NEGATIVE
Glucose, UA: NEGATIVE mg/dL
Hgb urine dipstick: NEGATIVE
Ketones, ur: NEGATIVE mg/dL
Leukocytes,Ua: NEGATIVE
Nitrite: NEGATIVE
Protein, ur: 30 mg/dL — AB
Specific Gravity, Urine: 1.023 (ref 1.005–1.030)
pH: 6 (ref 5.0–8.0)

## 2020-04-09 LAB — TYPE AND SCREEN
ABO/RH(D): A POS
Antibody Screen: NEGATIVE

## 2020-04-09 LAB — PROTIME-INR
INR: 1 (ref 0.8–1.2)
Prothrombin Time: 12.7 seconds (ref 11.4–15.2)

## 2020-04-09 LAB — APTT: aPTT: 27 seconds (ref 24–36)

## 2020-04-09 NOTE — Patient Instructions (Signed)
Your procedure is scheduled on: 04/17/20 Report to [facility]. To find out your arrival time please call [phone] between 1PM - 3PM on 04/16/20.  Remember: Instructions that are not followed completely may result in serious medical risk, up to and including death, or upon the discretion of your surgeon and anesthesiologist your surgery may need to be rescheduled.     _X__ 1. Do not eat food after midnight the night before your procedure.                 No gum chewing or hard candies. You may drink clear liquids up to 2 hours                 before you are scheduled to arrive for your surgery- DO not drink clear                 liquids within 2 hours of the start of your surgery.                 Clear Liquids include:  water, apple juice without pulp, clear carbohydrate                 drink such as Clearfast or Gatorade, Black Coffee or Tea (Do not add                 anything to coffee or tea). Diabetics water only  __X__2.  On the morning of surgery brush your teeth with toothpaste and water, you                 may rinse your mouth with mouthwash if you wish.  Do not swallow any              toothpaste of mouthwash.     _X__ 3.  No Alcohol for 24 hours before or after surgery.   _X__ 4.  Do Not Smoke or use e-cigarettes For 24 Hours Prior to Your Surgery.                 Do not use any chewable tobacco products for at least 6 hours prior to                 surgery.  ____  5.  Bring all medications with you on the day of surgery if instructed.   __X__  6.  Notify your doctor if there is any change in your medical condition      (cold, fever, infections).     Do not wear jewelry, make-up, hairpins, clips or nail polish. Do not wear lotions, powders, or perfumes.  Do not shave 48 hours prior to surgery. Men may shave face and neck. Do not bring valuables to the hospital.    [facility] is not responsible for any belongings or  valuables.  Contacts, dentures/partials or body piercings may not be worn into surgery. Bring a case for your contacts, glasses or hearing aids, a denture cup will be supplied. Leave your suitcase in the car. After surgery it may be brought to your room. For patients admitted to the hospital, discharge time is determined by your treatment team.   Patients discharged the day of surgery will not be allowed to drive home.   Please read over the following fact sheets that you were given:   MRSA Information  __X__ Take these medicines the morning of surgery with A SIP OF WATER:  1. amLODipine (NORVASC) 5 MG tablet  2. atorvastatin (LIPITOR) 40 MG tablet  3. desvenlafaxine (PRISTIQ) 50 MG 24 hr tablet  4. gabapentin (NEURONTIN) 300 MG capsule  5. omeprazole (PRILOSEC) 20 MG capsule  6. Oxycodone HCl 20 MG TABS if needed  7. valACYclovir (VALTREX) 1000 MG tablet if needed  ____ Fleet Enema (as directed)   __X__ Use CHG Soap/SAGE wipes as directed  ____ Use inhalers on the day of surgery  __X__ Stop metformin/Janumet/Farxiga 2 days prior to surgery    ____ Take 1/2 of usual insulin dose the night before surgery. No insulin the morning          of surgery.   ____ Stop Blood Thinners Coumadin/Plavix/Xarelto/Pleta/Pradaxa/Eliquis/Effient/Aspirin  on   Or contact your Surgeon, Cardiologist or Medical Doctor regarding  ability to stop your blood thinners  __X__ Stop Anti-inflammatories 7 days before surgery such as Advil, Ibuprofen, Motrin,  BC or Goodies Powder, Naprosyn, Naproxen, Aleve, Aspirin    __X__ Stop all herbal supplements, fish oil or vitamin E until after surgery.    ____ Bring C-Pap to the hospital.

## 2020-04-09 NOTE — Telephone Encounter (Signed)
Requested Prescriptions  Pending Prescriptions Disp Refills  . valACYclovir (VALTREX) 1000 MG tablet [Pharmacy Med Name: valacyclovir 1 gram tablet] 6 tablet 0    Sig: TAKE ONE TABLET BY MOUTH TWICE DAILY AS NEEDED FOR OUTBREAK     Antimicrobials:  Antiviral Agents - Anti-Herpetic Passed - 04/09/2020 10:20 AM      Passed - Valid encounter within last 12 months    Recent Outpatient Visits          2 months ago Major depression, recurrent, chronic [facility])   [facility] Steele Sizer, MD   4 months ago Fever blister   [facility] Steele Sizer, MD   9 months ago Anemia, unspecified type   [facility] Steele Sizer, MD   1 year ago Pre-op evaluation   [place], [place]   1 year ago Major depression, recurrent, chronic [facility])   [facility] Steele Sizer, MD      Future Appointments            In 3 months  [facility], [place]   In 4 months Steele Sizer, MD [facility]

## 2020-04-09 NOTE — Pre-Procedure Instructions (Signed)
EKG performed today was reviewed by Dr. Lubertha Basque (anesthesia). No further cardiac testing required prior to surgery.

## 2020-04-12 DIAGNOSIS — M25562 Pain in left knee: Secondary | ICD-10-CM | POA: Diagnosis not present

## 2020-04-12 DIAGNOSIS — M25551 Pain in right hip: Secondary | ICD-10-CM | POA: Diagnosis not present

## 2020-04-12 DIAGNOSIS — M542 Cervicalgia: Secondary | ICD-10-CM | POA: Diagnosis not present

## 2020-04-12 DIAGNOSIS — M25561 Pain in right knee: Secondary | ICD-10-CM | POA: Diagnosis not present

## 2020-04-12 DIAGNOSIS — M170 Bilateral primary osteoarthritis of knee: Secondary | ICD-10-CM | POA: Diagnosis not present

## 2020-04-12 DIAGNOSIS — M25552 Pain in left hip: Secondary | ICD-10-CM | POA: Diagnosis not present

## 2020-04-12 DIAGNOSIS — M545 Low back pain: Secondary | ICD-10-CM | POA: Diagnosis not present

## 2020-04-12 DIAGNOSIS — M25511 Pain in right shoulder: Secondary | ICD-10-CM | POA: Diagnosis not present

## 2020-04-12 DIAGNOSIS — G894 Chronic pain syndrome: Secondary | ICD-10-CM | POA: Diagnosis not present

## 2020-04-12 DIAGNOSIS — M5431 Sciatica, right side: Secondary | ICD-10-CM | POA: Diagnosis not present

## 2020-04-12 DIAGNOSIS — M5432 Sciatica, left side: Secondary | ICD-10-CM | POA: Diagnosis not present

## 2020-04-12 DIAGNOSIS — M25512 Pain in left shoulder: Secondary | ICD-10-CM | POA: Diagnosis not present

## 2020-04-15 ENCOUNTER — Other Ambulatory Visit
Admission: RE | Admit: 2020-04-15 | Discharge: 2020-04-15 | Disposition: A | Discharge status: Home / Self Care | Payer: Medicare Other | Source: Ambulatory Visit | Attending: Orthopedic Surgery | Admitting: Orthopedic Surgery

## 2020-04-15 ENCOUNTER — Other Ambulatory Visit: Payer: Self-pay

## 2020-04-15 DIAGNOSIS — Z20822 Contact with and (suspected) exposure to covid-19: Secondary | ICD-10-CM | POA: Diagnosis not present

## 2020-04-15 DIAGNOSIS — Z01812 Encounter for preprocedural laboratory examination: Secondary | ICD-10-CM | POA: Diagnosis present

## 2020-04-16 LAB — SARS CORONAVIRUS 2 (TAT 6-24 HRS): SARS Coronavirus 2: NEGATIVE

## 2020-04-17 ENCOUNTER — Ambulatory Visit: Payer: Medicare Other | Admitting: Anesthesiology

## 2020-04-17 ENCOUNTER — Encounter
Admission: RE | Disposition: A | Discharge status: Home / Self Care | Payer: Self-pay | Source: Home / Self Care | Attending: Orthopedic Surgery

## 2020-04-17 ENCOUNTER — Ambulatory Visit: Payer: Medicare Other

## 2020-04-17 ENCOUNTER — Observation Stay: Payer: Medicare Other

## 2020-04-17 ENCOUNTER — Inpatient Hospital Stay
Admission: RE | Admit: 2020-04-17 | Discharge: 2020-04-19 | DRG: 470 | Disposition: A | Discharge status: Home / Self Care | Payer: Medicare Other | Attending: Orthopedic Surgery | Admitting: Orthopedic Surgery

## 2020-04-17 ENCOUNTER — Other Ambulatory Visit: Payer: Self-pay

## 2020-04-17 DIAGNOSIS — Z9049 Acquired absence of other specified parts of digestive tract: Secondary | ICD-10-CM

## 2020-04-17 DIAGNOSIS — Z09 Encounter for follow-up examination after completed treatment for conditions other than malignant neoplasm: Secondary | ICD-10-CM

## 2020-04-17 DIAGNOSIS — M25761 Osteophyte, right knee: Secondary | ICD-10-CM | POA: Diagnosis present

## 2020-04-17 DIAGNOSIS — Z7984 Long term (current) use of oral hypoglycemic drugs: Secondary | ICD-10-CM

## 2020-04-17 DIAGNOSIS — I10 Essential (primary) hypertension: Secondary | ICD-10-CM | POA: Diagnosis present

## 2020-04-17 DIAGNOSIS — Z96652 Presence of left artificial knee joint: Secondary | ICD-10-CM | POA: Diagnosis present

## 2020-04-17 DIAGNOSIS — E785 Hyperlipidemia, unspecified: Secondary | ICD-10-CM | POA: Diagnosis present

## 2020-04-17 DIAGNOSIS — J449 Chronic obstructive pulmonary disease, unspecified: Secondary | ICD-10-CM | POA: Diagnosis present

## 2020-04-17 DIAGNOSIS — Z87891 Personal history of nicotine dependence: Secondary | ICD-10-CM

## 2020-04-17 DIAGNOSIS — M25561 Pain in right knee: Secondary | ICD-10-CM

## 2020-04-17 DIAGNOSIS — K589 Irritable bowel syndrome without diarrhea: Secondary | ICD-10-CM | POA: Diagnosis present

## 2020-04-17 DIAGNOSIS — E8881 Metabolic syndrome: Secondary | ICD-10-CM | POA: Diagnosis present

## 2020-04-17 DIAGNOSIS — Z96651 Presence of right artificial knee joint: Secondary | ICD-10-CM

## 2020-04-17 DIAGNOSIS — Z79899 Other long term (current) drug therapy: Secondary | ICD-10-CM

## 2020-04-17 DIAGNOSIS — M1711 Unilateral primary osteoarthritis, right knee: Principal | ICD-10-CM | POA: Diagnosis present

## 2020-04-17 DIAGNOSIS — F329 Major depressive disorder, single episode, unspecified: Secondary | ICD-10-CM | POA: Diagnosis present

## 2020-04-17 DIAGNOSIS — Z7901 Long term (current) use of anticoagulants: Secondary | ICD-10-CM

## 2020-04-17 DIAGNOSIS — K219 Gastro-esophageal reflux disease without esophagitis: Secondary | ICD-10-CM | POA: Diagnosis present

## 2020-04-17 HISTORY — PX: TOTAL KNEE ARTHROPLASTY: SHX125

## 2020-04-17 SURGERY — ARTHROPLASTY, KNEE, TOTAL
Anesthesia: General | Site: Knee | Laterality: Right

## 2020-04-17 MED ORDER — ACETAMINOPHEN 10 MG/ML IV SOLN: 1000.0000 mg | Freq: Once | INTRAVENOUS | Status: DC | PRN

## 2020-04-17 MED ORDER — AMITRIPTYLINE HCL 10 MG PO TABS
10.0000 mg | ORAL_TABLET | Freq: Every day | ORAL | Status: DC
  Administered 2020-04-17 – 2020-04-19 (×3): 10 mg via ORAL
  Filled 2020-04-17 (×3): qty 1

## 2020-04-17 MED ORDER — AMLODIPINE BESYLATE 5 MG PO TABS
5.0000 mg | ORAL_TABLET | Freq: Every day | ORAL | Status: DC
  Administered 2020-04-18 – 2020-04-19 (×2): 5 mg via ORAL
  Filled 2020-04-17 (×2): qty 1

## 2020-04-17 MED ORDER — MIDAZOLAM HCL 2 MG/2ML IJ SOLN
INTRAMUSCULAR | Status: AC
  Administered 2020-04-17: 1 mg via INTRAVENOUS
  Filled 2020-04-17: qty 2

## 2020-04-17 MED ORDER — DOCUSATE SODIUM 100 MG PO CAPS
100.0000 mg | ORAL_CAPSULE | Freq: Two times a day (BID) | ORAL | Status: DC
  Administered 2020-04-17 – 2020-04-19 (×4): 100 mg via ORAL
  Filled 2020-04-17 (×4): qty 1

## 2020-04-17 MED ORDER — SUCCINYLCHOLINE CHLORIDE 20 MG/ML IJ SOLN
INTRAMUSCULAR | Status: DC | PRN
  Administered 2020-04-17: 100 mg via INTRAVENOUS

## 2020-04-17 MED ORDER — VARENICLINE TARTRATE 1 MG PO TABS
1.0000 mg | ORAL_TABLET | Freq: Two times a day (BID) | ORAL | Status: DC
  Administered 2020-04-17 – 2020-04-19 (×4): 1 mg via ORAL
  Filled 2020-04-17 (×5): qty 1

## 2020-04-17 MED ORDER — PHENOL 1.4 % MT LIQD
1.0000 | OROMUCOSAL | Status: DC | PRN
  Filled 2020-04-17: qty 177

## 2020-04-17 MED ORDER — MIDAZOLAM HCL 2 MG/2ML IJ SOLN
INTRAMUSCULAR | Status: DC | PRN
  Administered 2020-04-17: 2 mg via INTRAVENOUS

## 2020-04-17 MED ORDER — DIPHENHYDRAMINE HCL 12.5 MG/5ML PO ELIX: 12.5000 mg | ORAL_SOLUTION | ORAL | Status: DC | PRN

## 2020-04-17 MED ORDER — DEXMEDETOMIDINE HCL IN NACL 200 MCG/50ML IV SOLN
INTRAVENOUS | Status: DC | PRN
Start: 2020-04-17 — End: 2020-04-17
  Administered 2020-04-17: 12 ug via INTRAVENOUS
  Administered 2020-04-17: 20 ug via INTRAVENOUS

## 2020-04-17 MED ORDER — CHLORHEXIDINE GLUCONATE 0.12 % MT SOLN: 15.0000 mL | Freq: Once | OROMUCOSAL | Status: AC

## 2020-04-17 MED ORDER — RIVAROXABAN 10 MG PO TABS
10.0000 mg | ORAL_TABLET | Freq: Every day | ORAL | Status: DC
  Administered 2020-04-18 – 2020-04-19 (×2): 10 mg via ORAL
  Filled 2020-04-17 (×2): qty 1

## 2020-04-17 MED ORDER — FENTANYL CITRATE (PF) 100 MCG/2ML IJ SOLN: 50.0000 ug | Freq: Once | INTRAMUSCULAR | Status: AC

## 2020-04-17 MED ORDER — METOCLOPRAMIDE HCL 10 MG PO TABS: 5.0000 mg | ORAL_TABLET | Freq: Three times a day (TID) | ORAL | Status: DC | PRN

## 2020-04-17 MED ORDER — ACETAMINOPHEN 325 MG PO TABS: 325.0000 mg | ORAL_TABLET | Freq: Four times a day (QID) | ORAL | Status: DC | PRN

## 2020-04-17 MED ORDER — SODIUM CHLORIDE 0.9 % IV SOLN
INTRAVENOUS | Status: DC | PRN
  Administered 2020-04-17: 60 mL

## 2020-04-17 MED ORDER — SODIUM CHLORIDE 0.9 % IR SOLN
Status: DC | PRN
  Administered 2020-04-17: 2000 mL

## 2020-04-17 MED ORDER — HYDROMORPHONE HCL 1 MG/ML IJ SOLN
INTRAMUSCULAR | Status: AC
  Administered 2020-04-17: 0.5 mg via INTRAVENOUS
  Filled 2020-04-17: qty 1

## 2020-04-17 MED ORDER — PROPOFOL 10 MG/ML IV BOLUS
INTRAVENOUS | Status: DC | PRN
  Administered 2020-04-17: 150 mg via INTRAVENOUS

## 2020-04-17 MED ORDER — OXYCODONE HCL 5 MG/5ML PO SOLN: 5.0000 mg | Freq: Once | ORAL | Status: DC | PRN

## 2020-04-17 MED ORDER — TRANEXAMIC ACID-NACL 1000-0.7 MG/100ML-% IV SOLN
INTRAVENOUS | Status: AC
  Filled 2020-04-17: qty 100

## 2020-04-17 MED ORDER — FENTANYL CITRATE (PF) 100 MCG/2ML IJ SOLN
INTRAMUSCULAR | Status: AC
  Administered 2020-04-17: 25 ug via INTRAVENOUS
  Filled 2020-04-17: qty 2

## 2020-04-17 MED ORDER — KETAMINE HCL 50 MG/ML IJ SOLN
INTRAMUSCULAR | Status: DC | PRN
Start: 2020-04-17 — End: 2020-04-17
  Administered 2020-04-17: 10 mg via INTRAVENOUS
  Administered 2020-04-17: 15 mg via INTRAVENOUS

## 2020-04-17 MED ORDER — HYDROCODONE-ACETAMINOPHEN 5-325 MG PO TABS
1.0000 | ORAL_TABLET | ORAL | Status: DC | PRN
  Administered 2020-04-17: 1 via ORAL
  Administered 2020-04-17 – 2020-04-18 (×2): 2 via ORAL
  Filled 2020-04-17: qty 1
  Filled 2020-04-17 (×3): qty 2

## 2020-04-17 MED ORDER — QUETIAPINE FUMARATE 25 MG PO TABS
25.0000 mg | ORAL_TABLET | Freq: Every day | ORAL | Status: DC
  Administered 2020-04-17 – 2020-04-18 (×2): 25 mg via ORAL
  Filled 2020-04-17 (×2): qty 1

## 2020-04-17 MED ORDER — BUPIVACAINE HCL (PF) 0.5 % IJ SOLN
INTRAMUSCULAR | Status: AC
  Filled 2020-04-17: qty 20

## 2020-04-17 MED ORDER — FENTANYL CITRATE (PF) 100 MCG/2ML IJ SOLN
INTRAMUSCULAR | Status: AC
  Filled 2020-04-17: qty 2

## 2020-04-17 MED ORDER — KETAMINE HCL 50 MG/ML IJ SOLN
INTRAMUSCULAR | Status: DC | PRN
Start: 2020-04-17 — End: 2020-04-17

## 2020-04-17 MED ORDER — BUPIVACAINE HCL (PF) 0.5 % IJ SOLN
INTRAMUSCULAR | Status: AC
  Filled 2020-04-17: qty 30

## 2020-04-17 MED ORDER — FENTANYL CITRATE (PF) 100 MCG/2ML IJ SOLN
INTRAMUSCULAR | Status: AC
  Administered 2020-04-17: 50 ug via INTRAVENOUS
  Filled 2020-04-17: qty 2

## 2020-04-17 MED ORDER — HYDROCHLOROTHIAZIDE 25 MG PO TABS
25.0000 mg | ORAL_TABLET | Freq: Every day | ORAL | Status: DC
  Administered 2020-04-18 – 2020-04-19 (×2): 25 mg via ORAL
  Filled 2020-04-17 (×2): qty 1

## 2020-04-17 MED ORDER — BACITRACIN 50000 UNITS IM SOLR
INTRAMUSCULAR | Status: AC
  Filled 2020-04-17: qty 2

## 2020-04-17 MED ORDER — ONDANSETRON HCL 4 MG/2ML IJ SOLN
INTRAMUSCULAR | Status: DC | PRN
  Administered 2020-04-17: 4 mg via INTRAVENOUS

## 2020-04-17 MED ORDER — BUPIVACAINE HCL (PF) 0.25 % IJ SOLN
INTRAMUSCULAR | Status: DC | PRN
Start: 2020-04-17 — End: 2020-04-17
  Administered 2020-04-17: 30 mL

## 2020-04-17 MED ORDER — FENTANYL CITRATE (PF) 100 MCG/2ML IJ SOLN
25.0000 ug | INTRAMUSCULAR | Status: DC | PRN
  Administered 2020-04-17: 50 ug via INTRAVENOUS
  Administered 2020-04-17 (×2): 25 ug via INTRAVENOUS

## 2020-04-17 MED ORDER — ALUM & MAG HYDROXIDE-SIMETH 200-200-20 MG/5ML PO SUSP: 30.0000 mL | ORAL | Status: DC | PRN

## 2020-04-17 MED ORDER — KETOROLAC TROMETHAMINE 15 MG/ML IJ SOLN
15.0000 mg | Freq: Four times a day (QID) | INTRAMUSCULAR | Status: AC
  Administered 2020-04-17 – 2020-04-18 (×4): 15 mg via INTRAVENOUS
  Filled 2020-04-17 (×4): qty 1

## 2020-04-17 MED ORDER — BUPIVACAINE LIPOSOME 1.3 % IJ SUSP
INTRAMUSCULAR | Status: AC
  Filled 2020-04-17: qty 20

## 2020-04-17 MED ORDER — HYDROCODONE-ACETAMINOPHEN 7.5-325 MG PO TABS
1.0000 | ORAL_TABLET | ORAL | Status: DC | PRN
  Administered 2020-04-18: 1 via ORAL
  Filled 2020-04-17: qty 1

## 2020-04-17 MED ORDER — ONDANSETRON HCL 4 MG/2ML IJ SOLN: 4.0000 mg | Freq: Four times a day (QID) | INTRAMUSCULAR | Status: DC | PRN

## 2020-04-17 MED ORDER — HYDROMORPHONE HCL 1 MG/ML IJ SOLN
INTRAMUSCULAR | Status: DC | PRN
  Administered 2020-04-17 (×2): .5 mg via INTRAVENOUS

## 2020-04-17 MED ORDER — MAGNESIUM CITRATE PO SOLN
1.0000 | Freq: Once | ORAL | Status: DC | PRN
  Filled 2020-04-17: qty 296

## 2020-04-17 MED ORDER — TRANEXAMIC ACID-NACL 1000-0.7 MG/100ML-% IV SOLN: 1000.0000 mg | INTRAVENOUS | Status: DC

## 2020-04-17 MED ORDER — CHLORHEXIDINE GLUCONATE 0.12 % MT SOLN
OROMUCOSAL | Status: AC
  Administered 2020-04-17: 15 mL via OROMUCOSAL
  Filled 2020-04-17: qty 15

## 2020-04-17 MED ORDER — GLYCOPYRROLATE 0.2 MG/ML IJ SOLN
INTRAMUSCULAR | Status: DC | PRN
  Administered 2020-04-17: .2 mg via INTRAVENOUS

## 2020-04-17 MED ORDER — FAMOTIDINE 20 MG PO TABS
ORAL_TABLET | ORAL | Status: AC
  Administered 2020-04-17: 20 mg via ORAL
  Filled 2020-04-17: qty 1

## 2020-04-17 MED ORDER — SUGAMMADEX SODIUM 500 MG/5ML IV SOLN
INTRAVENOUS | Status: DC | PRN
  Administered 2020-04-17: 450 mg via INTRAVENOUS

## 2020-04-17 MED ORDER — MENTHOL 3 MG MT LOZG
1.0000 | LOZENGE | OROMUCOSAL | Status: DC | PRN
  Filled 2020-04-17: qty 9

## 2020-04-17 MED ORDER — OXYCODONE HCL 5 MG PO TABS: 5.0000 mg | ORAL_TABLET | Freq: Once | ORAL | Status: DC | PRN

## 2020-04-17 MED ORDER — PANTOPRAZOLE SODIUM 40 MG PO TBEC
40.0000 mg | DELAYED_RELEASE_TABLET | Freq: Every day | ORAL | Status: DC
  Administered 2020-04-18 – 2020-04-19 (×2): 40 mg via ORAL
  Filled 2020-04-17 (×2): qty 1

## 2020-04-17 MED ORDER — LACTATED RINGERS IV SOLN: INTRAVENOUS | Status: DC

## 2020-04-17 MED ORDER — MORPHINE SULFATE (PF) 2 MG/ML IV SOLN: 0.5000 mg | INTRAVENOUS | Status: DC | PRN

## 2020-04-17 MED ORDER — METOCLOPRAMIDE HCL 5 MG/ML IJ SOLN: 5.0000 mg | Freq: Three times a day (TID) | INTRAMUSCULAR | Status: DC | PRN

## 2020-04-17 MED ORDER — GABAPENTIN 300 MG PO CAPS
300.0000 mg | ORAL_CAPSULE | Freq: Two times a day (BID) | ORAL | Status: DC
  Administered 2020-04-17 – 2020-04-19 (×4): 300 mg via ORAL
  Filled 2020-04-17 (×4): qty 1

## 2020-04-17 MED ORDER — CEFAZOLIN SODIUM-DEXTROSE 2-4 GM/100ML-% IV SOLN
2.0000 g | Freq: Four times a day (QID) | INTRAVENOUS | Status: AC
  Administered 2020-04-17 – 2020-04-18 (×2): 2 g via INTRAVENOUS
  Filled 2020-04-17 (×2): qty 100

## 2020-04-17 MED ORDER — ACETAMINOPHEN 10 MG/ML IV SOLN
INTRAVENOUS | Status: AC
  Filled 2020-04-17: qty 100

## 2020-04-17 MED ORDER — BUPIVACAINE-EPINEPHRINE (PF) 0.5% -1:200000 IJ SOLN
INTRAMUSCULAR | Status: DC | PRN
  Administered 2020-04-17: 30 mL

## 2020-04-17 MED ORDER — CEFAZOLIN SODIUM-DEXTROSE 2-4 GM/100ML-% IV SOLN
INTRAVENOUS | Status: AC
  Filled 2020-04-17: qty 100

## 2020-04-17 MED ORDER — ORAL CARE MOUTH RINSE: 15.0000 mL | Freq: Once | OROMUCOSAL | Status: AC

## 2020-04-17 MED ORDER — DEXAMETHASONE SODIUM PHOSPHATE 10 MG/ML IJ SOLN
INTRAMUSCULAR | Status: DC | PRN
  Administered 2020-04-17: 10 mg via INTRAVENOUS

## 2020-04-17 MED ORDER — CEFAZOLIN SODIUM-DEXTROSE 2-4 GM/100ML-% IV SOLN
2.0000 g | INTRAVENOUS | Status: AC
  Administered 2020-04-17: 2 g via INTRAVENOUS

## 2020-04-17 MED ORDER — VENLAFAXINE HCL ER 37.5 MG PO CP24
37.5000 mg | ORAL_CAPSULE | Freq: Every day | ORAL | Status: DC
  Administered 2020-04-18 – 2020-04-19 (×2): 37.5 mg via ORAL
  Filled 2020-04-17 (×2): qty 1

## 2020-04-17 MED ORDER — PROPOFOL 500 MG/50ML IV EMUL
INTRAVENOUS | Status: AC
  Filled 2020-04-17: qty 50

## 2020-04-17 MED ORDER — LIDOCAINE HCL (PF) 2 % IJ SOLN
INTRAMUSCULAR | Status: AC
  Filled 2020-04-17: qty 5

## 2020-04-17 MED ORDER — LOSARTAN POTASSIUM-HCTZ 100-25 MG PO TABS: 1.0000 | ORAL_TABLET | Freq: Every day | ORAL | Status: DC

## 2020-04-17 MED ORDER — HYDROMORPHONE HCL 1 MG/ML IJ SOLN
0.5000 mg | INTRAMUSCULAR | Status: AC | PRN
  Administered 2020-04-17 (×2): 0.5 mg via INTRAVENOUS

## 2020-04-17 MED ORDER — ACETAMINOPHEN 10 MG/ML IV SOLN
INTRAVENOUS | Status: DC | PRN
  Administered 2020-04-17: 1000 mg via INTRAVENOUS

## 2020-04-17 MED ORDER — ONDANSETRON HCL 4 MG PO TABS: 4.0000 mg | ORAL_TABLET | Freq: Four times a day (QID) | ORAL | Status: DC | PRN

## 2020-04-17 MED ORDER — FAMOTIDINE 20 MG PO TABS: 20.0000 mg | ORAL_TABLET | Freq: Once | ORAL | Status: AC

## 2020-04-17 MED ORDER — LOSARTAN POTASSIUM 50 MG PO TABS
100.0000 mg | ORAL_TABLET | Freq: Every day | ORAL | Status: DC
  Administered 2020-04-18 – 2020-04-19 (×2): 100 mg via ORAL
  Filled 2020-04-17: qty 2

## 2020-04-17 MED ORDER — KETOROLAC TROMETHAMINE 30 MG/ML IJ SOLN
INTRAMUSCULAR | Status: DC | PRN
  Administered 2020-04-17: 30 mg via INTRAVENOUS

## 2020-04-17 MED ORDER — POVIDONE-IODINE 10 % EX SWAB: 2.0000 "application " | Freq: Once | CUTANEOUS | Status: DC

## 2020-04-17 MED ORDER — LABETALOL HCL 5 MG/ML IV SOLN
INTRAVENOUS | Status: AC
  Filled 2020-04-17: qty 4

## 2020-04-17 MED ORDER — BISACODYL 10 MG RE SUPP: 10.0000 mg | Freq: Every day | RECTAL | Status: DC | PRN

## 2020-04-17 MED ORDER — FENTANYL CITRATE (PF) 100 MCG/2ML IJ SOLN
INTRAMUSCULAR | Status: DC | PRN
  Administered 2020-04-17: 100 ug via INTRAVENOUS
  Administered 2020-04-17: 50 ug via INTRAVENOUS
  Administered 2020-04-17: 100 ug via INTRAVENOUS
  Administered 2020-04-17 (×3): 50 ug via INTRAVENOUS

## 2020-04-17 MED ORDER — STERILE WATER FOR INJECTION IJ SOLN
INTRAMUSCULAR | Status: AC
  Filled 2020-04-17: qty 10

## 2020-04-17 MED ORDER — MIDAZOLAM HCL 2 MG/2ML IJ SOLN: 1.0000 mg | Freq: Once | INTRAMUSCULAR | Status: AC

## 2020-04-17 MED ORDER — HYDROMORPHONE HCL 1 MG/ML IJ SOLN
INTRAMUSCULAR | Status: AC
  Filled 2020-04-17: qty 1

## 2020-04-17 MED ORDER — LIDOCAINE HCL (CARDIAC) PF 100 MG/5ML IV SOSY
PREFILLED_SYRINGE | INTRAVENOUS | Status: DC | PRN
  Administered 2020-04-17: 100 mg via INTRAVENOUS

## 2020-04-17 MED ORDER — FENTANYL CITRATE (PF) 250 MCG/5ML IJ SOLN
INTRAMUSCULAR | Status: AC
  Filled 2020-04-17: qty 5

## 2020-04-17 MED ORDER — ROCURONIUM BROMIDE 100 MG/10ML IV SOLN
INTRAVENOUS | Status: DC | PRN
  Administered 2020-04-17: 50 mg via INTRAVENOUS

## 2020-04-17 MED ORDER — ACETAMINOPHEN 500 MG PO TABS
500.0000 mg | ORAL_TABLET | Freq: Four times a day (QID) | ORAL | Status: AC
  Administered 2020-04-17 – 2020-04-18 (×4): 500 mg via ORAL
  Filled 2020-04-17 (×4): qty 1

## 2020-04-17 MED ORDER — ONDANSETRON HCL 4 MG/2ML IJ SOLN: 4.0000 mg | Freq: Once | INTRAMUSCULAR | Status: DC | PRN

## 2020-04-17 MED ORDER — KETAMINE HCL 50 MG/ML IJ SOLN
INTRAMUSCULAR | Status: AC
  Filled 2020-04-17: qty 10

## 2020-04-17 MED ORDER — SODIUM CHLORIDE FLUSH 0.9 % IV SOLN
INTRAVENOUS | Status: AC
  Filled 2020-04-17: qty 40

## 2020-04-17 MED ORDER — MIDAZOLAM HCL 2 MG/2ML IJ SOLN
INTRAMUSCULAR | Status: AC
  Filled 2020-04-17: qty 2

## 2020-04-17 MED ORDER — ATORVASTATIN CALCIUM 20 MG PO TABS
40.0000 mg | ORAL_TABLET | Freq: Every day | ORAL | Status: DC
  Administered 2020-04-18 – 2020-04-19 (×2): 40 mg via ORAL
  Filled 2020-04-17 (×2): qty 2

## 2020-04-17 MED ORDER — ALBUTEROL SULFATE HFA 108 (90 BASE) MCG/ACT IN AERS
INHALATION_SPRAY | RESPIRATORY_TRACT | Status: AC
  Filled 2020-04-17: qty 6.7

## 2020-04-17 MED ORDER — METFORMIN HCL 500 MG PO TABS
500.0000 mg | ORAL_TABLET | Freq: Every day | ORAL | Status: DC
  Administered 2020-04-18 – 2020-04-19 (×2): 500 mg via ORAL
  Filled 2020-04-17 (×2): qty 1

## 2020-04-17 MED ORDER — KETOROLAC TROMETHAMINE 30 MG/ML IJ SOLN: INTRAMUSCULAR | Status: DC | PRN

## 2020-04-17 SURGICAL SUPPLY — 59 items
BASEPLATE TIBIAL RT SZ2 (Knees) ×3 IMPLANT
BLADE SAW 18WX90L 1.27 THK (BLADE) ×3 IMPLANT
BLADE SAW SAG 25X90X1.19 (BLADE) ×3 IMPLANT
BOWL CEMENT MIX W/ADAPTER (MISCELLANEOUS) ×3 IMPLANT
BRUSH SCRUB EZ  4% CHG (MISCELLANEOUS) ×6
BRUSH SCRUB EZ 4% CHG (MISCELLANEOUS) ×2 IMPLANT
CANISTER SUCT 1200ML W/VALVE (MISCELLANEOUS) ×3 IMPLANT
CANISTER SUCT 3000ML PPV (MISCELLANEOUS) ×6 IMPLANT
CEMENT BONE 40GM (Cement) ×6 IMPLANT
CHLORAPREP W/TINT 26 (MISCELLANEOUS) ×6 IMPLANT
COMP PATELLA GENESIS 29 OVAL (Orthopedic Implant) ×3 IMPLANT
COMPONENT FEM OXIN SZ4 RT NRRW (Orthopedic Implant) ×3 IMPLANT
COMPONENT PTLLA GENS 29 OVAL (Orthopedic Implant) ×1 IMPLANT
COOLER POLAR GLACIER W/PUMP (MISCELLANEOUS) ×3 IMPLANT
COVER WAND RF STERILE (DRAPES) ×3 IMPLANT
CUFF TOURN SGL QUICK 30 (TOURNIQUET CUFF) ×3
CUFF TRNQT CYL 30X4X21-28X (TOURNIQUET CUFF) ×1 IMPLANT
DRAPE 3/4 80X56 (DRAPES) ×6 IMPLANT
DRAPE INCISE IOBAN 66X60 STRL (DRAPES) ×3 IMPLANT
ELECT REM PT RETURN 9FT ADLT (ELECTROSURGICAL) ×3
ELECTRODE REM PT RTRN 9FT ADLT (ELECTROSURGICAL) ×1 IMPLANT
GAUZE SPONGE 4X4 12PLY STRL (GAUZE/BANDAGES/DRESSINGS) ×3 IMPLANT
GAUZE XEROFORM 1X8 LF (GAUZE/BANDAGES/DRESSINGS) ×3 IMPLANT
GLOVE BIO SURGEON STRL SZ8 (GLOVE) ×3 IMPLANT
GLOVE BIOGEL PI IND STRL 8.5 (GLOVE) ×1 IMPLANT
GLOVE BIOGEL PI INDICATOR 8.5 (GLOVE) ×2
GLOVE INDICATOR 8.0 STRL GRN (GLOVE) ×3 IMPLANT
GLOVE SURG ORTHO 8.0 STRL STRW (GLOVE) ×9 IMPLANT
GOWN STRL REUS W/ TWL LRG LVL3 (GOWN DISPOSABLE) ×1 IMPLANT
GOWN STRL REUS W/ TWL XL LVL3 (GOWN DISPOSABLE) ×1 IMPLANT
GOWN STRL REUS W/TWL LRG LVL3 (GOWN DISPOSABLE) ×3
GOWN STRL REUS W/TWL XL LVL3 (GOWN DISPOSABLE) ×3
HOOD PEEL AWAY FLYTE STAYCOOL (MISCELLANEOUS) ×9 IMPLANT
INSERT ARTI HI FLEX 9 SZ1-2 (Insert) ×3 IMPLANT
IV NS 1000ML (IV SOLUTION) ×3
IV NS 1000ML BAXH (IV SOLUTION) ×1 IMPLANT
KIT TURNOVER KIT A (KITS) ×3 IMPLANT
MAT ABSORB  FLUID 56X50 GRAY (MISCELLANEOUS) ×3
MAT ABSORB FLUID 56X50 GRAY (MISCELLANEOUS) ×1 IMPLANT
NDL SAFETY ECLIPSE 18X1.5 (NEEDLE) ×1 IMPLANT
NEEDLE HYPO 18GX1.5 SHARP (NEEDLE) ×3
NEEDLE SPNL 20GX3.5 QUINCKE YW (NEEDLE) ×3 IMPLANT
NS IRRIG 1000ML POUR BTL (IV SOLUTION) ×3 IMPLANT
PACK TOTAL KNEE (MISCELLANEOUS) ×3 IMPLANT
PAD DE MAYO PRESSURE PROTECT (MISCELLANEOUS) ×3 IMPLANT
PAD WRAPON POLAR KNEE (MISCELLANEOUS) ×1 IMPLANT
PULSAVAC PLUS IRRIG FAN TIP (DISPOSABLE) ×3
STAPLER SKIN PROX 35W (STAPLE) ×3 IMPLANT
SUCTION FRAZIER HANDLE 10FR (MISCELLANEOUS) ×3
SUCTION TUBE FRAZIER 10FR DISP (MISCELLANEOUS) ×1 IMPLANT
SUT DVC 2 QUILL PDO  T11 36X36 (SUTURE) ×3
SUT DVC 2 QUILL PDO T11 36X36 (SUTURE) ×1 IMPLANT
SUT VIC AB 2-0 CT1 18 (SUTURE) ×3 IMPLANT
SUT VIC AB 2-0 CT1 27 (SUTURE)
SUT VIC AB 2-0 CT1 TAPERPNT 27 (SUTURE) IMPLANT
SUT VIC AB PLUS 45CM 1-MO-4 (SUTURE) ×3 IMPLANT
SYR 30ML LL (SYRINGE) ×9 IMPLANT
TIP FAN IRRIG PULSAVAC PLUS (DISPOSABLE) ×1 IMPLANT
WRAPON POLAR PAD KNEE (MISCELLANEOUS) ×3

## 2020-04-17 NOTE — Anesthesia Procedure Notes (Signed)
Anesthesia Regional Block: Adductor canal block   Pre-Anesthetic Checklist: ,, timeout performed, Correct Patient, Correct Site, Correct Laterality, Correct Procedure, Correct Position, site marked, Risks and benefits discussed,  Surgical consent,  Pre-op evaluation,  At surgeon's request and post-op pain management  Laterality: Lower and Right  Prep: chloraprep       Needles:  Injection technique: Single-shot  Needle Type: Echogenic Needle     Needle Length: 9cm  Needle Gauge: 21     Additional Needles:   Procedures:,,,, ultrasound used (permanent image in chart),,,,  Narrative:  Start time: 04/17/2020 1:00 PM End time: 04/17/2020 1:03 PM Injection made incrementally with aspirations every 5 mL.  Events: blood aspirated,,,,,,,,,,  Performed by: Personally  Anesthesiologist: Arita Miss, MD  Additional Notes: Patient consented for risk and benefits of nerve block including but not limited to nerve damage, failed block, bleeding and infection.  Patient voiced understanding.  Functioning IV was confirmed and monitors were applied.  A echogenic needle was used. Sterile prep,hand hygiene and sterile gloves were used. Minimal sedation used for procedure.   No paresthesia endorsed by patient during the procedure.  Negative aspiration prior to incremental administration of local anesthetic. The patient tolerated the procedure well with no immediate complications.

## 2020-04-17 NOTE — Transfer of Care (Signed)
Immediate Anesthesia Transfer of Care Note  Patient: [name]  Procedure(s) Performed: TOTAL KNEE ARTHROPLASTY (Right Knee)  Patient Location: PACU  Anesthesia Type:General  Level of Consciousness: awake, drowsy and patient cooperative  Airway & Oxygen Therapy: Patient Spontanous Breathing and Patient connected to face mask oxygen  Post-op Assessment: Report given to RN and Post -op Vital signs reviewed and stable  Post vital signs: Reviewed and stable  Last Vitals:  Vitals Value Taken Time  BP 162/91 04/17/20 1534  Temp 36.6 C 04/17/20 1534  Pulse 100 04/17/20 1540  Resp 18 04/17/20 1540  SpO2 100 % 04/17/20 1540  Vitals shown include unvalidated device data.  Last Pain:  Vitals:   04/17/20 1534  TempSrc:   PainSc: Asleep         Complications: No complications documented.

## 2020-04-17 NOTE — H&P (Signed)
The patient has been re-examined, and the chart reviewed, and there have been no interval changes to the documented history and physical.  Plan a right total knee today.  Anesthesia is consulted regarding a peripheral nerve block for post-operative pain.  The risks, benefits, and alternatives have been discussed at length, and the patient is willing to proceed.     

## 2020-04-17 NOTE — Anesthesia Procedure Notes (Signed)
Procedure Name: Intubation Performed by: Kelton Pillar, CRNA Pre-anesthesia Checklist: Patient identified, Emergency Drugs available, Suction available and Patient being monitored Patient Re-evaluated:Patient Re-evaluated prior to induction Oxygen Delivery Method: Circle system utilized Preoxygenation: Pre-oxygenation with 100% oxygen Induction Type: IV induction Ventilation: Mask ventilation without difficulty Laryngoscope Size: McGraph and 3 Grade View: Grade I Tube size: 7.0 mm Number of attempts: 1 Airway Equipment and Method: Stylet and Video-laryngoscopy Placement Confirmation: ETT inserted through vocal cords under direct vision,  positive ETCO2,  CO2 detector and breath sounds checked- equal and bilateral Secured at: 21 cm Tube secured with: Tape Dental Injury: Teeth and Oropharynx as per pre-operative assessment  Difficulty Due To: Difficult Airway- due to reduced neck mobility and Difficulty was anticipated Comments: Pt w/decreased ROM, neck mobility, head/neck remain neutral throughout entire procedure, atraumatic intubation TFH

## 2020-04-17 NOTE — Anesthesia Preprocedure Evaluation (Signed)
Anesthesia Evaluation  Patient identified by MRN, date of birth, ID band Patient awake    Reviewed: Allergy & Precautions, H&P , NPO status , Patient's Chart, lab work & pertinent test results  History of Anesthesia Complications Negative for: history of anesthetic complications  Airway Mallampati: II  TM Distance: >3 FB Neck ROM: full    Dental  (+) Chipped, Dental Advisory Given   Pulmonary neg shortness of breath, COPD, Patient abstained from smoking., former smoker,  COPD in chart but patient denies any hx   Pulmonary exam normal breath sounds clear to auscultation       Cardiovascular Exercise Tolerance: Good hypertension, (-) angina(-) Past MI and (-) DOE  Rhythm:Regular Rate:Normal - Systolic murmurs    Neuro/Psych PSYCHIATRIC DISORDERS Depression  Neuromuscular disease    GI/Hepatic Neg liver ROS, PUD, GERD  Medicated and Controlled,  Endo/Other  negative endocrine ROS  Renal/GU      Musculoskeletal   Abdominal   Peds  Hematology negative hematology ROS (+)   Anesthesia Other Findings Patient reports back pain and other back symptoms that come and go  Past Medical History: No date: Arthritis     Comment:  bilateral knees No date: Contact dermatitis and other eczema, due to unspecified cause No date: COPD (chronic obstructive pulmonary disease) (HCC) No date: Depressive disorder No date: Dysmetabolic syndrome X No date: GERD (gastroesophageal reflux disease) No date: Hyperlipidemia No date: Hypertension No date: IBS (irritable bowel syndrome) No date: Insomnia No date: Lumbago No date: Nonspecific abnormal electrocardiogram (ECG) (EKG) No date: Other ovarian failure(256.39) No date: Postmenopausal atrophic vaginitis No date: Symptomatic menopausal or female climacteric states No date: Unspecified vitamin D deficiency  Past Surgical History: No date: BACK SURGERY No date: BREAST BIOPSY; Left      Comment:  neg No date: BREAST LUMPECTOMY; Left 08/15/2018: CARPAL TUNNEL RELEASE; Left     Comment:  Procedure: CARPAL TUNNEL RELEASE;  Surgeon: Earnestine Leys, MD;  Location: [facility] ORS;  Service: Orthopedics;                Laterality: Left; No date: CERVICAL DISCECTOMY     Comment:  x 2; metal plate No date: CHOLECYSTECTOMY 08/08/2018: COLONOSCOPY WITH PROPOFOL; N/A     Comment:  Procedure: COLONOSCOPY WITH PROPOFOL;  Surgeon:               Virgel Manifold, MD;  Location: [facility] ENDOSCOPY;                Service: Endoscopy;  Laterality: N/A; 03/21/2015: DILATATION & CURETTAGE/HYSTEROSCOPY WITH MYOSURE; N/A     Comment:  Procedure: DILATATION & CURETTAGE/HYSTEROSCOPY;                Surgeon: Aletha Halim, MD;  Location: [facility] ORS;                Service: Gynecology;  Laterality: N/A; No date: DILATION AND CURETTAGE OF UTERUS No date: ENDOMETRIAL ABLATION 04/13/2018: ESOPHAGOGASTRODUODENOSCOPY (EGD) WITH PROPOFOL; N/A     Comment:  Procedure: ESOPHAGOGASTRODUODENOSCOPY (EGD) WITH               PROPOFOL;  Surgeon: Virgel Manifold, MD;  Location:               [facility] ENDOSCOPY;  Service: Endoscopy;  Laterality: N/A; 08/08/2018: ESOPHAGOGASTRODUODENOSCOPY (EGD) WITH PROPOFOL; N/A     Comment:  Procedure: ESOPHAGOGASTRODUODENOSCOPY (EGD) WITH  PROPOFOL;  Surgeon: Virgel Manifold, MD;  Location:               [facility] ENDOSCOPY;  Service: Endoscopy;  Laterality: N/A; No date: SPINAL FUSION     Comment:  lumbar x2 No date: TONSILLECTOMY No date: TUBAL LIGATION  BMI    Body Mass Index:  30.56 kg/m      Reproductive/Obstetrics negative OB ROS                             Anesthesia Physical  Anesthesia Plan  ASA: II  Anesthesia Plan: General ETT   Post-op Pain Management:    Induction: Intravenous  PONV Risk Score and Plan: 4 or greater and Ondansetron, Dexamethasone, Midazolam and Treatment may vary due to age or  medical condition  Airway Management Planned: Oral ETT  Additional Equipment: None  Intra-op Plan:   Post-operative Plan: Extubation in OR  Informed Consent: I have reviewed the patients History and Physical, chart, labs and discussed the procedure including the risks, benefits and alternatives for the proposed anesthesia with the patient or authorized representative who has indicated his/her understanding and acceptance.     Dental Advisory Given  Plan Discussed with: Anesthesiologist, CRNA and Surgeon  Anesthesia Plan Comments: (Discussed r/b/a of neuraxial anesthesia, patient declines and does not want a needle in her back.  Discussed risks of anesthesia with patient, including PONV, sore throat, lip/dental damage. Rare risks discussed as well, such as cardiorespiratory and neurological sequelae. Patient understands.  Discussed r/b/a of adductor canal nerve block, including:  - bleeding, infection, nerve damage - poor or non functioning block. Patient understands and agrees )        Anesthesia Quick Evaluation

## 2020-04-17 NOTE — Anesthesia Postprocedure Evaluation (Signed)
Anesthesia Post Note  Patient: [name]  Procedure(s) Performed: TOTAL KNEE ARTHROPLASTY (Right Knee)  Patient location during evaluation: PACU Anesthesia Type: General Level of consciousness: awake and alert Pain management: pain level controlled Vital Signs Assessment: post-procedure vital signs reviewed and stable Respiratory status: spontaneous breathing, nonlabored ventilation, respiratory function stable and patient connected to nasal cannula oxygen Cardiovascular status: blood pressure returned to baseline and stable Postop Assessment: no apparent nausea or vomiting Anesthetic complications: no   No complications documented.   Last Vitals:  Vitals:   04/17/20 1548 04/17/20 1549  BP:  (!) 148/98  Pulse: 93 96  Resp: 15 (!) 26  Temp:    SpO2: 98% 99%    Last Pain:  Vitals:   04/17/20 1555  TempSrc:   PainSc: 7                  Arita Miss

## 2020-04-17 NOTE — Op Note (Signed)
DATE OF SURGERY:  04/17/2020 TIME: 3:30 PM  PATIENT NAME:  [name]   AGE: 59 y.o.    PRE-OPERATIVE DIAGNOSIS:  M17.11 unilateral primary osteoarthritis, right knee  POST-OPERATIVE DIAGNOSIS:  Same  PROCEDURE:  Procedure(s): TOTAL KNEE ARTHROPLASTY, right  SURGEON:  Lovell Sheehan, MD   ASSISTANT:  Carlynn Spry, PA-C  OPERATIVE IMPLANTS: Tamala [employer]hew, Cruciate Retaining Oxinium Femoral component size  4 narrow, Fixed Bearing Tray size 2, Patella polyethylene 3-peg oval button size 29 mm, with a 9 mm HighFlex insert.   PREOPERATIVE INDICATIONS:  MCKINSEY KEAGLE is an 60 y.o. female who has a diagnosis of M17.11 unilateral primary osteoarthritis, right knee and elected for a right total knee arthroplasty after failing nonoperative treatment, including activity modification, pain medication, physical therapy and injections who has significant impairment of their activities of daily living.  Radiographs have demonstrated tricompartmental osteoarthritis joint space narrowing, osteophytes, subchondral sclerosis and cyst formation.  The risks, benefits, and alternatives were discussed at length including but not limited to the risks of infection, bleeding, nerve or blood vessel injury, knee stiffness, fracture, dislocation, loosening or failure of the hardware and the need for further surgery. Medical risks include but not limited to DVT and pulmonary embolism, myocardial infarction, stroke, pneumonia, respiratory failure and death. I discussed these risks with the patient in my office prior to the date of surgery. They understood these risks and were willing to proceed.  OPERATIVE FINDINGS AND UNIQUE ASPECTS OF THE CASE:  All three compartments with advanced and severe degenerative changes, large osteophytes and an abundance of synovial fluid. Significant deformity was also noted. A decision was made to proceed with total knee arthroplasty.   OPERATIVE DESCRIPTION:  The patient was  brought to the operative room and placed in a supine position after undergoing placement of a general anesthetic. IV antibiotics were given. Patient received tranexamic acid. The lower extremity was prepped and draped in the usual sterile fashion.  A time out was performed to verify the patient's name, date of birth, medical record number, correct site of surgery and correct procedure to be performed. The timeout was also used to confirm the patient received antibiotics and that appropriate instruments, implants and radiographs studies were available in the room.  The leg was elevated and exsanguinated with an Esmarch and the tourniquet was inflated to 250 mmHg.  A midline incision was made over the left knee.. A medial parapatellar arthrotomy was then made and the patella subluxed laterally and the knee was brought into 90 of flexion. Hoffa's fat pad along with the anterior cruciate ligament was resected and the medial joint line was exposed.  Attention was then turned to preparation of the patella. The thickness of the patella was measured with a caliper, the diameter measured with the patella templates.  The patella resection was then made with an oscillating saw using the patella cutting guide.  The 29 mm button fit appropriately.  3 peg holes for the patella component were then drilled.  The extramedullary tibial cutting guide was then placed using the anterior tibial crest and second ray of the foot as a reference.  The tibial cutting guide was adjusted to allow for appropriate posterior slope.  The tibial cutting block was pinned into position. The slotted stylus was used to measure the proximal tibial resection of 9 mm off the high lateral side. Care was taken during the tibial resection to protect the medial and collateral ligaments.  The resected tibial bone was  removed.  The distal femur was resected using the Visionaire cutting guide.  Care was taken to protect the collateral ligaments during  distal femoral resection.  The distal femoral resection was performed with an oscillating saw. The femoral cutting guide was then removed. Extension gap was measured with a 9 mm spacer block and alignment and extension was confirmed using a long alignment rod. The femur was sized to be a 4 narrow. Rotation of the referencing guide was checked with the epicondylar axis and Whitesides line. Then the 4-in-1 cutting jig was then applied to the distal femur. A stylus was used to confirm that the anterior femur would not be notched.   Then the anterior, posterior and chamfer femoral cuts were then made with an oscillating saw.  The knee was distracted and all posterior osteophytes were removed.  The flexion gap was then measured with a flexion spacer block and long alignment rod and was found to be symmetric with the extension gap and perpendicular to mechanical axis of the tibia.  The proximal tibia plateau was then sized with trial trays. The best coverage was achieved with a size 2. This tibial tray was then pinned into position. The proximal tibia was then prepared with the keel punch.  After tibial preparation was completed, all trial components were inserted with polyethylene trials. The knee achieved full extension and flexed to 120 degrees. Ligament were stable to varus and valgus at full extension as well as 30, 60 and 90 degrees of flexion.   The trials were then placed. Knee was taken through a full range of motion and deemed to be stable with the trial components. All trial components were then removed.  The joint was copiously irrigated with pulse lavage.  The final total knee arthroplasty components were then cemented into place. The knee was held in extension while cement was allowed to cure.The knee was taken through a range of motion and the patella tracked well and the knee was again irrigated copiously.  The knee capsule was then injected with Exparel.  The medial arthrotomy was closed with #1  Vicryl and #2 Quill. The subcutaneous tissue closed with  2-0 vicryl, and skin approximated with staples.  A dry sterile and compressive dressing was applied.  A Polar Care was applied to the operative knee.  The patient was awakened and brought to the PACU in stable and satisfactory condition.  All sharp, lap and instrument counts were correct at the conclusion the case. I spoke with the patient's family in the postop consultation room to let them know the case had been performed without complication and the patient was stable in recovery room.   Total tourniquet time was 42 minutes.

## 2020-04-18 ENCOUNTER — Encounter: Payer: Self-pay | Admitting: Orthopedic Surgery

## 2020-04-18 LAB — CBC
HCT: 32.4 % — ABNORMAL LOW (ref 36.0–46.0)
Hemoglobin: 11.1 g/dL — ABNORMAL LOW (ref 12.0–15.0)
MCH: 31.4 pg (ref 26.0–34.0)
MCHC: 34.3 g/dL (ref 30.0–36.0)
MCV: 91.8 fL (ref 80.0–100.0)
Platelets: 290 10*3/uL (ref 150–400)
RBC: 3.53 MIL/uL — ABNORMAL LOW (ref 3.87–5.11)
RDW: 12.7 % (ref 11.5–15.5)
WBC: 13.9 10*3/uL — ABNORMAL HIGH (ref 4.0–10.5)
nRBC: 0 % (ref 0.0–0.2)

## 2020-04-18 LAB — BASIC METABOLIC PANEL
Anion gap: 13 (ref 5–15)
BUN: 11 mg/dL (ref 6–20)
CO2: 26 mmol/L (ref 22–32)
Calcium: 9.6 mg/dL (ref 8.9–10.3)
Chloride: 100 mmol/L (ref 98–111)
Creatinine, Ser: 0.77 mg/dL (ref 0.44–1.00)
GFR calc Af Amer: >60 mL/min (ref >60)
GFR calc non Af Amer: >60 mL/min (ref >60)
Glucose, Bld: 197 mg/dL — ABNORMAL HIGH (ref 70–99)
Potassium: 3.3 mmol/L — ABNORMAL LOW (ref 3.5–5.1)
Sodium: 139 mmol/L (ref 135–145)

## 2020-04-18 MED ORDER — OXYCODONE HCL 5 MG PO TABS
10.0000 mg | ORAL_TABLET | ORAL | Status: DC | PRN
  Administered 2020-04-18 – 2020-04-19 (×5): 10 mg via ORAL
  Filled 2020-04-18 (×5): qty 2

## 2020-04-18 NOTE — Progress Notes (Signed)
Patient refused hydrocodone states " I haven't took that since my back surgery ".  Sent Dr. Harlow Mares , Carlynn Spry, PA a secure chat to see if we can get medication changed. Patient states she stayed up most of night d/t pain.  Paged Dr. Sabra Heck , since Dr. Harlow Mares may be in a surgical procedure at this time.

## 2020-04-18 NOTE — TOC Initial Note (Signed)
Transition of Care [facility]) - Initial/Assessment Note    Patient Details  Name: [name] MRN: [id number] Date of Birth: 1960-11-24  Transition of Care [facility]) CM/SW Contact:    Elease Hashimoto, LCSW Phone Number: 04/18/2020, 12:39 PM  Clinical Narrative:  Met with pt to discuss discharge needs. Has equipment from previous admits and is set up with OPPT via [facility]. She has been there before and has an appointment already for OPPT once discharged. She is struggling with pain since her pain meds needs to be adjusted and she is waiting for MD to respond to bedside RN. Will continue to follow if other needs. Probably DC tomorrow.              Expected Discharge Plan: OP Rehab Barriers to Discharge: Continued Medical Work up   Patient Goals and CMS Choice Patient states their goals for this hospitalization and ongoing recovery are:: I plan to go home tomorrow and am waiting for MD to change my pain meds CMS Medicare.gov Compare Post Acute Care list provided to:: Patient Choice offered to / list presented to : Patient  Expected Discharge Plan and Services Expected Discharge Plan: OP Rehab In-house Referral: Clinical Social Work   Post Acute Care Choice: NA Living arrangements for the past 2 months: Single Family Home                                      Prior Living Arrangements/Services Living arrangements for the past 2 months: Single Family Home Lives with:: Spouse Patient language and need for interpreter reviewed:: No Do you feel safe going back to the place where you live?: Yes      Need for Family Participation in Patient Care: No (Comment) Care giver support system in place?: Yes (comment) Current home services: DME (has rw and bsc) Criminal Activity/Legal Involvement Pertinent to Current Situation/Hospitalization: No - Comment as needed  Activities of Daily Living      Permission Sought/Granted Permission sought to share information with : Family Supports,  Chartered certified accountant granted to share information with : Yes, Verbal Permission Granted  Share Information with NAME: samuel  Permission granted to share info w AGENCY: [place] granted to share info w Contact Information: husband  Emotional Assessment Appearance:: Appears stated age Attitude/Demeanor/Rapport: Engaged Affect (typically observed): Adaptable, Accepting Orientation: : Oriented to Self, Oriented to Place, Oriented to  Time, Oriented to Situation   Psych Involvement: No (comment)  Admission diagnosis:  History of total knee arthroplasty, right [Z96.651] Patient Active Problem List   Diagnosis Date Noted  . History of total knee arthroplasty, right 04/17/2020  . S/P TKR (total knee replacement) using cement, left 04/12/2019  . Overweight (BMI 25.0-29.9) 04/06/2019  . Peptic ulcer   . Benign neoplasm of ascending colon   . Diverticulosis of large intestine without diverticulitis   . Simple chronic bronchitis ([place]) 08/03/2018  . Pancreatic lesion 04/21/2018  . Abdominal aortic atherosclerosis ([place]) 04/21/2018  . Acute peptic ulcer of stomach   . Gastric polyp   . Abdominal pain, epigastric   . Trigger thumb of right hand 02/25/2018  . Carpal tunnel syndrome on both sides 10/21/2017  . CRP elevated 08/09/2017  . Elevated C-reactive protein 07/28/2017  . Osteoarthritis of both knees 02/19/2016  . COPD, mild ([place]) 05/08/2015  . Vitamin D deficiency 05/07/2015  . Metabolic syndrome 93/26/7124  .  Eczema 05/07/2015  . Chronic insomnia 05/07/2015  . Hypertension, benign 05/07/2015  . Dyslipidemia 05/07/2015  . Hyperglycemia 05/07/2015  . Chronic low back pain 05/07/2015  . IBS (irritable bowel syndrome) 05/07/2015  . Gastroesophageal reflux disease without esophagitis 05/07/2015  . Major depression, chronic ([place]) 05/07/2015  . Menopausal syndrome (hot flashes) 05/07/2015  . History of postmenopausal bleeding 01/14/2015   PCP:   Steele Sizer, MD Pharmacy:   [place], [place], [place] [address] Phone: [phone] Fax: [place], [place] - [address] [place] [address] Phone: [phone] Fax: [place] [address] [place]), [place] - [place]) [place] [address] Phone: [phone] Fax: [phone]     Social Determinants of Health (SDOH) Interventions    Readmission Risk Interventions No flowsheet data found.

## 2020-04-18 NOTE — Progress Notes (Signed)
Physical Therapy Treatment Patient Details Name: [name] MRN: [id number] DOB: 1961-04-14 Today's Date: 04/18/2020    History of Present Illness From MD OP Note: Pt is an 59 y.o. female who has a diagnosis of M17.11 unilateral primary osteoarthritis, right knee and elected for a right total knee arthroplasty after failing nonoperative treatment, including activity modification, pain medication, physical therapy and injections who has significant impairment of their activities of daily living. Radiographs have demonstrated tricompartmental osteoarthritis joint space narrowing, osteophytes, subchondral sclerosis and cyst formation.    PT Comments    Pt performed well with therapy today, and reported a decrease in pain levels.  Pt demonstrated ability to ambulate 150 ft x2 to go from patient room to therapy gym and back again.  Pt was motivated to perform stair training and with verbal cuing was able to navigate 4 steps with use of both handrails and CGA for safety, x2.  Pt educated and given TKA exercise packet to perform throughout the day.  Pt is motivated and will continue to benefit from skilled therapy in order to address R knee deficits in order to return to PLOF.    Follow Up Recommendations  Outpatient PT     Equipment Recommendations  Rolling walker with 5" wheels    Recommendations for Other Services       Precautions / Restrictions Restrictions Weight Bearing Restrictions: Yes RLE Weight Bearing: Weight bearing as tolerated    Mobility  Bed Mobility Overal bed mobility: Independent                Transfers Overall transfer level: Modified independent Equipment used: Rolling walker (2 wheeled)                Ambulation/Gait Ambulation/Gait assistance: Min guard Gait Distance (Feet): 150 Feet x2  Assistive device: Rolling walker (2 wheeled) Gait Pattern/deviations: Step-through pattern;Decreased step length - left;Decreased stance time - right      General Gait Details: Pt ambulated well and was able to navigate around the pod with CGA.  Pt did noticed one instance where she felt as though her R knee was giving way.   Stairs Stairs: Yes Stairs assistance: Modified independent (Device/Increase time);Min guard Stair Management: Two rails;Step to pattern;Forwards Number of Stairs: 4     Wheelchair Mobility    Modified Rankin (Stroke Patients Only)       Balance Overall balance assessment: Independent                                          Cognition Arousal/Alertness: Awake/alert Behavior During Therapy: WFL for tasks assessed/performed Overall Cognitive Status: Within Functional Limits for tasks assessed                                        Exercises Total Joint Exercises Gluteal Sets: Strengthening;10 reps Hip ABduction/ADduction: Strengthening;Right;Left;10 reps Long Arc Quad: Strengthening;Right;Left;10 reps Marching in Standing: Strengthening;Both;10 reps    General Comments        Pertinent Vitals/Pain Pain Assessment: 0-10 Pain Score: 5  Pain Location: R knee Pain Descriptors / Indicators: Aching;Discomfort Pain Intervention(s): Limited activity within patient's tolerance;Ice applied;Repositioned    Home Living                      Prior Function  PT Goals (current goals can now be found in the care plan section) Progress towards PT goals: Progressing toward goals    Frequency    BID      PT Plan      Co-evaluation              AM-PAC PT "6 Clicks" Mobility   Outcome Measure  Help needed turning from your back to your side while in a flat bed without using bedrails?: A Little Help needed moving from lying on your back to sitting on the side of a flat bed without using bedrails?: A Little Help needed moving to and from a bed to a chair (including a wheelchair)?: A Little Help needed standing up from a chair using your  arms (e.g., wheelchair or bedside chair)?: A Little Help needed to walk in hospital room?: A Little Help needed climbing 3-5 steps with a railing? : A Little 6 Click Score: 18    End of Session Equipment Utilized During Treatment: Gait belt Activity Tolerance: Patient limited by fatigue Patient left: with call bell/phone within reach;with SCD's reapplied;Other (comment);in bed (Polar care reapplied to R Knee.)   PT Visit Diagnosis: Other abnormalities of gait and mobility (R26.89);Muscle weakness (generalized) (M62.81);Pain Pain - Right/Left: Right Pain - part of body: Knee     Time: 0813-8871 PT Time Calculation (min) (ACUTE ONLY): 30 min  Charges:  $Gait Training: 8-22 mins $Therapeutic Exercise: 8-22 mins                     Gwenlyn Saran, PT, DPT 04/18/20, 4:11 PM

## 2020-04-18 NOTE — Progress Notes (Signed)
  Subjective:  Patient reports pain as moderate.    Objective:   VITALS:   Vitals:   04/17/20 2128 04/17/20 2210 04/17/20 2348 04/18/20 0542  BP: (!) 154/89 (!) 157/99 139/77 (!) 147/98  Pulse: 88 82 86 85  Resp: 17 19 17    Temp: 97.8 F (36.6 C) 98.2 F (36.8 C) 97.8 F (36.6 C)   TempSrc: Oral Oral Oral   SpO2: 100% 98% 95% 97%    PHYSICAL EXAM:  Neurologically intact ABD soft Neurovascular intact Sensation intact distally Intact pulses distally Dorsiflexion/Plantar flexion intact Incision: scant drainage No cellulitis present Compartment soft dressing changed  LABS  Results for orders placed or performed during the hospital encounter of 04/17/20 (from the past 24 hour(s))  CBC     Status: Abnormal   Collection Time: 04/18/20  4:41 AM  Result Value Ref Range   WBC 13.9 (H) 4.0 - 10.5 K/uL   RBC 3.53 (L) 3.87 - 5.11 MIL/uL   Hemoglobin 11.1 (L) 12.0 - 15.0 g/dL   HCT 32.4 (L) 36 - 46 %   MCV 91.8 80.0 - 100.0 fL   MCH 31.4 26.0 - 34.0 pg   MCHC 34.3 30.0 - 36.0 g/dL   RDW 12.7 11.5 - 15.5 %   Platelets 290 150 - 400 K/uL   nRBC 0.0 0.0 - 0.2 %  Basic metabolic panel     Status: Abnormal   Collection Time: 04/18/20  4:41 AM  Result Value Ref Range   Sodium 139 135 - 145 mmol/L   Potassium 3.3 (L) 3.5 - 5.1 mmol/L   Chloride 100 98 - 111 mmol/L   CO2 26 22 - 32 mmol/L   Glucose, Bld 197 (H) 70 - 99 mg/dL   BUN 11 6 - 20 mg/dL   Creatinine, Ser 0.77 0.44 - 1.00 mg/dL   Calcium 9.6 8.9 - 10.3 mg/dL   GFR calc non Af Amer >60 >60 mL/min   GFR calc Af Amer >60 >60 mL/min   Anion gap 13 5 - 15    DG Knee Right Port  Result Date: 04/17/2020 CLINICAL DATA:  Total knee arthroplasty. EXAM: PORTABLE RIGHT KNEE - 1-2 VIEW COMPARISON:  Radiographs 02/19/2016. FINDINGS: AP and lateral views of the right knee demonstrate recent right total knee arthroplasty. The hardware is well positioned. There is a small amount of gas within the joint and surrounding soft  tissues. Anterior skin staples are present. IMPRESSION: No demonstrated complication following right total knee arthroplasty. Electronically Signed   By: Richardean Sale M.D.   On: 04/17/2020 16:02   [place] OR NERVE BLOCK-IMAGE ONLY [facility])  Result Date: 04/17/2020 There is no interpretation for this exam.  This order is for images obtained during a surgical procedure.  Please See "Surgeries" Tab for more information regarding the procedure.    Assessment/Plan: 1 Day Post-Op   Active Problems:   History of total knee arthroplasty, right   Advance diet Up with therapy  D/C tomorrow   Carlynn Spry , PA-C 04/18/2020, 7:11 AM

## 2020-04-18 NOTE — Evaluation (Signed)
Physical Therapy Evaluation Patient Details Name: [name] MRN: [id number] DOB: Dec 05, 1960 Today's Date: 04/18/2020   History of Present Illness  From MD OP Note: Pt is an 59 y.o. female who has a diagnosis of M17.11 unilateral primary osteoarthritis, right knee and elected for a right total knee arthroplasty after failing nonoperative treatment, including activity modification, pain medication, physical therapy and injections who has significant impairment of their activities of daily living.  Radiographs have demonstrated tricompartmental osteoarthritis joint space narrowing, osteophytes, subchondral sclerosis and cyst formation.    Clinical Impression  Pt is pleasant 59 y.o. referred to therapy in order to address deficits s/p R TKA.  Pt demonstrated muscle weakness, decreased ROM, and increased pain in the R knee.  Pt has h/x of L TKA and going through PT previously, so she has expectations for recovery process.  Pt performed well today with exercises and was able to perform several of the exercises without much difficulty.  Pt demonstrates adequate quad strength in order to perform SLR in supine position with B LE.  Pt was able to ambulate around the pod and back to the room with one instance of R knee giving away, but was controlled by patient and was able to maintain upright posture.  Pt will benefit from PT services in an outpatient setting upon discharge to safely address deficits listed in patient problem list for decreased caregiver assistance and eventual return to PLOF.     Follow Up Recommendations Outpatient PT    Equipment Recommendations  Rolling walker with 5" wheels    Recommendations for Other Services       Precautions / Restrictions Restrictions Weight Bearing Restrictions: Yes RLE Weight Bearing: Weight bearing as tolerated      Mobility  Bed Mobility Overal bed mobility: Independent                Transfers Overall transfer level:  Independent Equipment used: Rolling walker (2 wheeled)                Ambulation/Gait Ambulation/Gait assistance: Min guard Gait Distance (Feet): 100 Feet Assistive device: Rolling walker (2 wheeled) Gait Pattern/deviations: Step-through pattern;Decreased step length - left;Decreased stance time - right     General Gait Details: Pt ambulated well and was able to navigate around the pod with CGA.  Pt did noticed one instance where she felt as though her R knee was giving way.  Stairs            Wheelchair Mobility    Modified Rankin (Stroke Patients Only)       Balance Overall balance assessment: Independent                                           Pertinent Vitals/Pain Pain Assessment: 0-10 Pain Score: 8  Pain Location: R knee Pain Descriptors / Indicators: Aching;Discomfort Pain Intervention(s): Limited activity within patient's tolerance;Ice applied;Repositioned    Home Living Family/patient expects to be discharged to:: Private residence Living Arrangements: Spouse/significant other;Children Available Help at Discharge: Family;Available 24 hours/day Type of Home: House Home Access: Stairs to enter Entrance Stairs-Rails: Psychiatric nurse of Steps: 4 Home Layout: Two level;Able to live on main level with bedroom/bathroom Home Equipment: Gilford Rile - 2 wheels;Cane - single point      Prior Function Level of Independence: Independent         Comments: Independent with ADL's,  household and community ambulation.     Hand Dominance        Extremity/Trunk Assessment   Upper Extremity Assessment Upper Extremity Assessment: Overall WFL for tasks assessed    Lower Extremity Assessment Lower Extremity Assessment: Generalized weakness;RLE deficits/detail RLE Deficits / Details: Decreased ROM and increased pain levels in R knee due to TKA.       Communication   Communication: No difficulties  Cognition  Arousal/Alertness: Awake/alert Behavior During Therapy: WFL for tasks assessed/performed Overall Cognitive Status: Within Functional Limits for tasks assessed                                        General Comments      Exercises Total Joint Exercises Ankle Circles/Pumps: AROM;Both;10 reps Quad Sets: Strengthening;Both;10 reps Short Arc Quad: Strengthening;Both;10 reps Heel Slides: Strengthening;Both;10 reps Straight Leg Raises: Strengthening;Both;10 reps Goniometric ROM: R Knee Flex: 96 deg, Ext: lacking 4 deg from full extension Marching in Standing: Strengthening;Both;10 reps   Assessment/Plan    PT Assessment Patient needs continued PT services  PT Problem List Decreased strength;Decreased range of motion;Decreased activity tolerance;Decreased mobility       PT Treatment Interventions Gait training;Stair training;Functional mobility training;Therapeutic activities;Therapeutic exercise;Patient/family education    PT Goals (Current goals can be found in the Care Plan section)  Acute Rehab PT Goals Patient Stated Goal: Pt would like to increase strength and decrease pain in order to return to PLOF. PT Goal Formulation: With patient Time For Goal Achievement: 05/02/20 Potential to Achieve Goals: Good    Frequency BID   Barriers to discharge        Co-evaluation               AM-PAC PT "6 Clicks" Mobility  Outcome Measure Help needed turning from your back to your side while in a flat bed without using bedrails?: A Little Help needed moving from lying on your back to sitting on the side of a flat bed without using bedrails?: A Little Help needed moving to and from a bed to a chair (including a wheelchair)?: A Little Help needed standing up from a chair using your arms (e.g., wheelchair or bedside chair)?: A Little Help needed to walk in hospital room?: A Little Help needed climbing 3-5 steps with a railing? : A Little 6 Click Score: 18    End  of Session Equipment Utilized During Treatment: Gait belt Activity Tolerance: Patient limited by fatigue Patient left: in chair;with call bell/phone within reach;with SCD's reapplied;Other (comment) (Polar care reapplied to R Knee.) Nurse Communication: Mobility status;Weight bearing status PT Visit Diagnosis: Other abnormalities of gait and mobility (R26.89);Muscle weakness (generalized) (M62.81);Ataxic gait (R26.0);Pain Pain - Right/Left: Right Pain - part of body: Knee    Time: 1021-1108 PT Time Calculation (min) (ACUTE ONLY): 47 min   Charges:   PT Evaluation $PT Eval Low Complexity: 1 Low PT Treatments $Therapeutic Exercise: 8-22 mins        Gwenlyn Saran, PT, DPT 04/18/20, 11:48 AM

## 2020-04-19 DIAGNOSIS — J449 Chronic obstructive pulmonary disease, unspecified: Secondary | ICD-10-CM | POA: Diagnosis present

## 2020-04-19 DIAGNOSIS — E785 Hyperlipidemia, unspecified: Secondary | ICD-10-CM | POA: Diagnosis present

## 2020-04-19 DIAGNOSIS — K589 Irritable bowel syndrome without diarrhea: Secondary | ICD-10-CM | POA: Diagnosis present

## 2020-04-19 DIAGNOSIS — M25761 Osteophyte, right knee: Secondary | ICD-10-CM | POA: Diagnosis present

## 2020-04-19 DIAGNOSIS — E8881 Metabolic syndrome: Secondary | ICD-10-CM | POA: Diagnosis present

## 2020-04-19 DIAGNOSIS — I10 Essential (primary) hypertension: Secondary | ICD-10-CM | POA: Diagnosis present

## 2020-04-19 DIAGNOSIS — K219 Gastro-esophageal reflux disease without esophagitis: Secondary | ICD-10-CM | POA: Diagnosis present

## 2020-04-19 DIAGNOSIS — M1711 Unilateral primary osteoarthritis, right knee: Secondary | ICD-10-CM | POA: Diagnosis present

## 2020-04-19 DIAGNOSIS — F329 Major depressive disorder, single episode, unspecified: Secondary | ICD-10-CM | POA: Diagnosis present

## 2020-04-19 DIAGNOSIS — Z96652 Presence of left artificial knee joint: Secondary | ICD-10-CM | POA: Diagnosis present

## 2020-04-19 DIAGNOSIS — Z7984 Long term (current) use of oral hypoglycemic drugs: Secondary | ICD-10-CM | POA: Diagnosis not present

## 2020-04-19 DIAGNOSIS — Z79899 Other long term (current) drug therapy: Secondary | ICD-10-CM | POA: Diagnosis not present

## 2020-04-19 DIAGNOSIS — Z7901 Long term (current) use of anticoagulants: Secondary | ICD-10-CM | POA: Diagnosis not present

## 2020-04-19 DIAGNOSIS — Z9049 Acquired absence of other specified parts of digestive tract: Secondary | ICD-10-CM | POA: Diagnosis not present

## 2020-04-19 DIAGNOSIS — Z87891 Personal history of nicotine dependence: Secondary | ICD-10-CM | POA: Diagnosis not present

## 2020-04-19 LAB — CBC
HCT: 28.8 % — ABNORMAL LOW (ref 36.0–46.0)
Hemoglobin: 9.3 g/dL — ABNORMAL LOW (ref 12.0–15.0)
MCH: 30.9 pg (ref 26.0–34.0)
MCHC: 32.3 g/dL (ref 30.0–36.0)
MCV: 95.7 fL (ref 80.0–100.0)
Platelets: 260 10*3/uL (ref 150–400)
RBC: 3.01 MIL/uL — ABNORMAL LOW (ref 3.87–5.11)
RDW: 12.9 % (ref 11.5–15.5)
WBC: 11.6 10*3/uL — ABNORMAL HIGH (ref 4.0–10.5)
nRBC: 0 % (ref 0.0–0.2)

## 2020-04-19 MED ORDER — RIVAROXABAN 10 MG PO TABS: 10.0000 mg | ORAL_TABLET | Freq: Every day | ORAL | 0 refills | Status: DC

## 2020-04-19 MED ORDER — DOCUSATE SODIUM 100 MG PO CAPS: 100.0000 mg | ORAL_CAPSULE | Freq: Two times a day (BID) | ORAL | 0 refills | Status: DC

## 2020-04-19 MED ORDER — OXYCODONE HCL 10 MG PO TABS: 10.0000 mg | ORAL_TABLET | ORAL | 0 refills | Status: DC | PRN

## 2020-04-19 MED ORDER — OXYCODONE HCL 10 MG PO TABS: 10.0000 mg | ORAL_TABLET | ORAL | 0 refills | Status: AC | PRN

## 2020-04-19 MED ORDER — ONDANSETRON HCL 4 MG PO TABS: 4.0000 mg | ORAL_TABLET | Freq: Four times a day (QID) | ORAL | 0 refills | Status: DC | PRN

## 2020-04-19 NOTE — TOC Transition Note (Signed)
Transition of Care [facility]) - CM/SW Discharge Note   Patient Details  Name: [name] MRN: [id number] Date of Birth: [birth year]  [facility] of [facility] CM/SW Contact:  Elease Hashimoto, LCSW Phone Number: 04/19/2020, 9:37 AM   Clinical Narrative:   Pt is medically stable for discharge today. Pt is ready to go home and is moving well. Her husband will be able to assist if needed. She has all needed equipment and OPPT appointment already set up. Her main issue is her pain and this is better today. No further follow due to DC today.    Final next level of care: OP Rehab Barriers to Discharge: Barriers Resolved   Patient Goals and CMS Choice Patient states their goals for this hospitalization and ongoing recovery are:: I plan to go home tomorrow and am waiting for MD to change my pain meds CMS Medicare.gov Compare Post Acute Care list provided to:: Patient Choice offered to / list presented to : Patient  Discharge Placement                Patient to be transferred to facility by: Samule-husband via car Name of family member notified: Mikeal Hawthorne Patient and family notified of of transfer: 04/19/20  Discharge Plan and Services In-house Referral: Clinical Social Work   Post Acute Care Choice: NA                               Social Determinants of Health (SDOH) Interventions     Readmission Risk Interventions No flowsheet data found.

## 2020-04-19 NOTE — Discharge Instructions (Signed)
Continue weight bear as tolerated on the right lower extremity.    Elevate the right lower extremity whenever possible and continue the polar care while elevating the extremity. Patient may shower. No bath or submerging the wound.    Take Xarelto as directed for blood clot prevention.  Continue to work on knee range of motion exercises at home as instructed by physical therapy. Continue to use a walker for assistance with ambulation until cleared by physical therapy.  Call [phone] with any questions, such as fever > 101.5 degrees, drainage from the wound or shortness of breath.  

## 2020-04-19 NOTE — Progress Notes (Signed)
  Subjective:  Patient reports pain as mild to moderate.    Objective:   VITALS:   Vitals:   04/18/20 1546 04/19/20 0003 04/19/20 0214 04/19/20 0719  BP: (!) 163/82 (!) 173/84 96/63 112/67  Pulse: 75 66 60 (!) 57  Resp: 16 16  16   Temp: 98.4 F (36.9 C) 98.1 F (36.7 C)  97.7 F (36.5 C)  TempSrc: Oral Oral  Oral  SpO2: 99% 96%  98%    PHYSICAL EXAM:  Neurologically intact ABD soft Neurovascular intact Sensation intact distally Intact pulses distally Dorsiflexion/Plantar flexion intact Incision: dressing C/D/I No cellulitis present Compartment soft  LABS  Results for orders placed or performed during the hospital encounter of 04/17/20 (from the past 24 hour(s))  CBC     Status: Abnormal   Collection Time: 04/19/20  4:43 AM  Result Value Ref Range   WBC 11.6 (H) 4.0 - 10.5 K/uL   RBC 3.01 (L) 3.87 - 5.11 MIL/uL   Hemoglobin 9.3 (L) 12.0 - 15.0 g/dL   HCT 28.8 (L) 36 - 46 %   MCV 95.7 80.0 - 100.0 fL   MCH 30.9 26.0 - 34.0 pg   MCHC 32.3 30.0 - 36.0 g/dL   RDW 12.9 11.5 - 15.5 %   Platelets 260 150 - 400 K/uL   nRBC 0.0 0.0 - 0.2 %    DG Knee Right Port  Result Date: 04/17/2020 CLINICAL DATA:  Total knee arthroplasty. EXAM: PORTABLE RIGHT KNEE - 1-2 VIEW COMPARISON:  Radiographs 02/19/2016. FINDINGS: AP and lateral views of the right knee demonstrate recent right total knee arthroplasty. The hardware is well positioned. There is a small amount of gas within the joint and surrounding soft tissues. Anterior skin staples are present. IMPRESSION: No demonstrated complication following right total knee arthroplasty. Electronically Signed   By: Richardean Sale M.D.   On: 04/17/2020 16:02    Assessment/Plan: 2 Days Post-Op   Active Problems:   History of total knee arthroplasty, right   Advance diet Up with therapy  Discharge home today   Carlynn Spry , PA-C 04/19/2020, 12:52 PM

## 2020-04-19 NOTE — Discharge Summary (Signed)
Physician Discharge Summary  Patient ID: [name] MRN: [id number] DOB/AGE: Mar 21, 1961 59 y.o.  Admit date: 04/17/2020 Discharge date: 04/19/2020  Admission Diagnoses:  M17.11 unilateral primary osteoarthritis, right knee <principal problem not specified>  Discharge Diagnoses:  M17.11 unilateral primary osteoarthritis, right knee Active Problems:   History of total knee arthroplasty, right   Past Medical History:  Diagnosis Date  . Arthritis    bilateral knees  . Contact dermatitis and other eczema, due to unspecified cause   . COPD (chronic obstructive pulmonary disease) ([place])   . Depressive disorder   . Dysmetabolic syndrome X   . GERD (gastroesophageal reflux disease)   . Hyperlipidemia   . Hypertension   . IBS (irritable bowel syndrome)   . Insomnia   . Lumbago   . Nonspecific abnormal electrocardiogram (ECG) (EKG)   . Other ovarian failure(256.39)   . Postmenopausal atrophic vaginitis   . Symptomatic menopausal or female climacteric states   . Unspecified vitamin D deficiency     Surgeries: Procedure(s): TOTAL KNEE ARTHROPLASTY on 04/17/2020   Consultants (if any):   Discharged Condition: Improved  Hospital Course: [name] is an 59 y.o. female who was admitted 04/17/2020 with a diagnosis of  M17.11 unilateral primary osteoarthritis, right knee <principal problem not specified> and went to the operating room on 04/17/2020 and underwent the above named procedures.    She was given perioperative antibiotics:  Anti-infectives (From admission, onward)   Start     Dose/Rate Route Frequency Ordered Stop   04/17/20 2000  ceFAZolin (ANCEF) IVPB 2g/100 mL premix        2 g 200 mL/hr over 30 Minutes Intravenous Every 6 hours 04/17/20 1644 04/18/20 0309   04/17/20 1418  50,000 units bacitracin in 0.9% normal saline 250 mL irrigation  Status:  Discontinued          As needed 04/17/20 1418 04/17/20 1521   04/17/20 1219  ceFAZolin (ANCEF) 2-4 GM/100ML-% IVPB        Note to Pharmacy: Milinda Cave   : cabinet override      04/17/20 1219 04/17/20 1408   04/17/20 0600  ceFAZolin (ANCEF) IVPB 2g/100 mL premix        2 g 200 mL/hr over 30 Minutes Intravenous On call to O.R. 04/17/20 0155 04/17/20 1401    .  She was given sequential compression devices, early ambulation, and Xarelto for DVT prophylaxis.  She benefited maximally from the hospital stay and there were no complications.    Recent vital signs:  Vitals:   04/19/20 0214 04/19/20 0719  BP: 96/63 112/67  Pulse: 60 (!) 57  Resp:  16  Temp:  97.7 F (36.5 C)  SpO2:  98%    Recent laboratory studies:  Lab Results  Component Value Date   HGB 9.3 (L) 04/19/2020   HGB 11.1 (L) 04/18/2020   HGB 12.3 04/09/2020   Lab Results  Component Value Date   WBC 11.6 (H) 04/19/2020   PLT 260 04/19/2020   Lab Results  Component Value Date   INR 1.0 04/09/2020   Lab Results  Component Value Date   NA 139 04/18/2020   K 3.3 (L) 04/18/2020   CL 100 04/18/2020   CO2 26 04/18/2020   BUN 11 04/18/2020   CREATININE 0.77 04/18/2020   GLUCOSE 197 (H) 04/18/2020    Discharge Medications:   Allergies as of 04/19/2020   No Known Allergies     Medication List    STOP taking these  medications   meloxicam 15 MG tablet Commonly known as: MOBIC     TAKE these medications   amitriptyline 10 MG tablet Commonly known as: ELAVIL Take 1 tablet (10 mg total) by mouth daily. For IBS What changed: when to take this   amLODipine 5 MG tablet Commonly known as: NORVASC Take 1 tablet (5 mg total) by mouth daily.   atorvastatin 40 MG tablet Commonly known as: LIPITOR Take 1 tablet (40 mg total) by mouth daily.   Chantix Continuing Month Pak 1 MG tablet Generic drug: varenicline Take 1 tablet (1 mg total) by mouth 2 (two) times daily.   cholecalciferol 1000 units tablet Commonly known as: VITAMIN D Take 1,000 Units by mouth daily.   desvenlafaxine 50 MG 24 hr tablet Commonly known as:  PRISTIQ Take 1 tablet (50 mg total) by mouth daily.   docusate sodium 100 MG capsule Commonly known as: COLACE Take 1 capsule (100 mg total) by mouth 2 (two) times daily.   gabapentin 300 MG capsule Commonly known as: NEURONTIN Take 300 mg by mouth 2 (two) times daily.   losartan-hydrochlorothiazide 100-25 MG tablet Commonly known as: HYZAAR TAKE ONE TABLET BY MOUTH ONCE DAILY   metFORMIN 500 MG tablet Commonly known as: GLUCOPHAGE Take 1 tablet (500 mg total) by mouth daily with breakfast.   Narcan 4 MG/0.1ML Liqd nasal spray kit Generic drug: naloxone OPIOD EMERGENCY 1 SPRAY IN 1 NOSTRIL 1 TIME, MAY REPEAT IN OTHER NOSTRIL EVERY 2 - 3 MINS UNTIL RESPONSIVE /EMS ARRIVES   omeprazole 20 MG capsule Commonly known as: PRILOSEC Take 1 capsule (20 mg total) by mouth daily.   ondansetron 4 MG tablet Commonly known as: ZOFRAN Take 1 tablet (4 mg total) by mouth every 6 (six) hours as needed for nausea.   Oxycodone HCl 10 MG Tabs Take 1-2 tablets (10-20 mg total) by mouth every 4 (four) hours as needed for up to 5 days. What changed:   medication strength  how much to take  when to take this  reasons to take this   QUEtiapine 25 MG tablet Commonly known as: SEROQUEL Take 1 tablet (25 mg total) by mouth at bedtime.   rivaroxaban 10 MG Tabs tablet Commonly known as: XARELTO Take 1 tablet (10 mg total) by mouth daily with breakfast. Start taking on: April 20, 2020   tiZANidine 4 MG tablet Commonly known as: ZANAFLEX Take 4 mg by mouth every 12 (twelve) hours.   valACYclovir 1000 MG tablet Commonly known as: VALTREX TAKE ONE TABLET BY MOUTH TWICE DAILY AS NEEDED FOR OUTBREAK       Diagnostic Studies: DG Knee Right Port  Result Date: 04/17/2020 CLINICAL DATA:  Total knee arthroplasty. EXAM: PORTABLE RIGHT KNEE - 1-2 VIEW COMPARISON:  Radiographs 02/19/2016. FINDINGS: AP and lateral views of the right knee demonstrate recent right total knee arthroplasty. The  hardware is well positioned. There is a small amount of gas within the joint and surrounding soft tissues. Anterior skin staples are present. IMPRESSION: No demonstrated complication following right total knee arthroplasty. Electronically Signed   By: Richardean Sale M.D.   On: 04/17/2020 16:02   [place] OR NERVE BLOCK-IMAGE ONLY [facility])  Result Date: 04/17/2020 There is no interpretation for this exam.  This order is for images obtained during a surgical procedure.  Please See "Surgeries" Tab for more information regarding the procedure.    Disposition:   Discharge to Home  Follow up with Dr. Harlow Mares office in 2 weeks for staple removal.  Call to confirm appointment. [phone]     Signed: Carlynn Spry ,PA-C 04/19/2020, 12:59 PM

## 2020-04-19 NOTE — Progress Notes (Signed)
Physical Therapy Treatment Patient Details Name: [name] MRN: [id number] DOB: 12-19-1960 Today's Date: 04/19/2020    History of Present Illness From MD OP Note: Pt is an 59 y.o. female who has a diagnosis of M17.11 unilateral primary osteoarthritis, right knee and elected for a right total knee arthroplasty after failing nonoperative treatment, including activity modification, pain medication, physical therapy and injections who has significant impairment of their activities of daily living. Radiographs have demonstrated tricompartmental osteoarthritis joint space narrowing, osteophytes, subchondral sclerosis and cyst formation.    PT Comments    Participated in exercises as described below.  OOB and completed lap around pod and larger nursing unit.  Slow steady gait with no LOB or buckling.  Stair training completed yesterday and pt feels comfortable.  No further questions or concerns regarding therapy services.  Pt anticipating discharge home today.   Follow Up Recommendations  Outpatient PT     Equipment Recommendations  Rolling walker with 5" wheels    Recommendations for Other Services       Precautions / Restrictions Precautions Precautions: Knee Restrictions Weight Bearing Restrictions: Yes RLE Weight Bearing: Weight bearing as tolerated    Mobility  Bed Mobility Overal bed mobility: Independent                Transfers Overall transfer level: Modified independent Equipment used: Rolling walker (2 wheeled)                Ambulation/Gait Ambulation/Gait assistance: Min guard Gait Distance (Feet): 200 Feet Assistive device: Rolling walker (2 wheeled) Gait Pattern/deviations: Step-through pattern;Decreased step length - left;Decreased stance time - right Gait velocity: decreased   General Gait Details: navigated pod and larger nursing unit with slow but steady gait.  No LOB or buckling.   Stairs         General stair comments: reported  being comfortable and declined review again today.   Wheelchair Mobility    Modified Rankin (Stroke Patients Only)       Balance Overall balance assessment: Modified Independent                                          Cognition Arousal/Alertness: Awake/alert Behavior During Therapy: WFL for tasks assessed/performed Overall Cognitive Status: Within Functional Limits for tasks assessed                                        Exercises Total Joint Exercises Ankle Circles/Pumps: AROM;Both;10 reps Quad Sets: Strengthening;Both;10 reps Short Arc Quad: Strengthening;Both;10 reps Heel Slides: Strengthening;Both;10 reps Straight Leg Raises: Strengthening;Both;10 reps Goniometric ROM: 2-96 Marching in Standing: Strengthening;Both;10 reps Other Exercises Other Exercises: to commode to void    General Comments        Pertinent Vitals/Pain Pain Assessment: 0-10 Pain Score: 5  Pain Location: R knee Pain Descriptors / Indicators: Sore Pain Intervention(s): Premedicated before session;Limited activity within patient's tolerance;Repositioned;Ice applied    Home Living                      Prior Function            PT Goals (current goals can now be found in the care plan section) Progress towards PT goals: Progressing toward goals    Frequency    BID  PT Plan Current plan remains appropriate    Co-evaluation              AM-PAC PT "6 Clicks" Mobility   Outcome Measure  Help needed turning from your back to your side while in a flat bed without using bedrails?: None Help needed moving from lying on your back to sitting on the side of a flat bed without using bedrails?: None Help needed moving to and from a bed to a chair (including a wheelchair)?: None Help needed standing up from a chair using your arms (e.g., wheelchair or bedside chair)?: None Help needed to walk in hospital room?: A Little Help needed  climbing 3-5 steps with a railing? : A Little 6 Click Score: 22    End of Session Equipment Utilized During Treatment: Gait belt Activity Tolerance: Patient tolerated treatment well Patient left: with call bell/phone within reach;with SCD's reapplied;Other (comment);in bed;with bed alarm set Nurse Communication: Mobility status;Weight bearing status Pain - Right/Left: Right Pain - part of body: Knee     Time: 1368-5992 PT Time Calculation (min) (ACUTE ONLY): 24 min  Charges:  $Gait Training: 8-22 mins $Therapeutic Exercise: 8-22 mins                    Chesley Noon, PTA 04/19/20, 10:25 AM

## 2020-05-08 DIAGNOSIS — M25561 Pain in right knee: Secondary | ICD-10-CM | POA: Diagnosis not present

## 2020-05-08 DIAGNOSIS — M25661 Stiffness of right knee, not elsewhere classified: Secondary | ICD-10-CM | POA: Diagnosis not present

## 2020-05-08 DIAGNOSIS — R600 Localized edema: Secondary | ICD-10-CM | POA: Diagnosis not present

## 2020-05-11 NOTE — H&P (Signed)
PREOPERATIVE H&P  Chief Complaint: M17.11 unilateral primary osteoarthritis, right knee  HPI: [name] is a 59 y.o. female who presents for preoperative history and physical with a diagnosis of M17.11 unilateral primary osteoarthritis, right knee. Symptoms are rated as moderate to severe, and have been worsening.  This is significantly impairing activities of daily living.  She has elected for surgical management.   Past Medical History:  Diagnosis Date  . Arthritis    bilateral knees  . Contact dermatitis and other eczema, due to unspecified cause   . COPD (chronic obstructive pulmonary disease) ([place])   . Depressive disorder   . Dysmetabolic syndrome X   . GERD (gastroesophageal reflux disease)   . Hyperlipidemia   . Hypertension   . IBS (irritable bowel syndrome)   . Insomnia   . Lumbago   . Nonspecific abnormal electrocardiogram (ECG) (EKG)   . Other ovarian failure(256.39)   . Postmenopausal atrophic vaginitis   . Symptomatic menopausal or female climacteric states   . Unspecified vitamin D deficiency    Past Surgical History:  Procedure Laterality Date  . BACK SURGERY    . BREAST BIOPSY Left    neg  . BREAST LUMPECTOMY Left   . CARPAL TUNNEL RELEASE Left 08/15/2018   Procedure: CARPAL TUNNEL RELEASE;  Surgeon: Earnestine Leys, MD;  Location: [facility];  Service: Orthopedics;  Laterality: Left;  . CERVICAL DISCECTOMY     x 2; metal plate  . CHOLECYSTECTOMY    . COLONOSCOPY WITH PROPOFOL N/A 08/08/2018   Procedure: COLONOSCOPY WITH PROPOFOL;  Surgeon: Virgel Manifold, MD;  Location: [facility];  Service: Endoscopy;  Laterality: N/A;  . DILATATION & CURETTAGE/HYSTEROSCOPY WITH MYOSURE N/A 03/21/2015   Procedure: DILATATION & CURETTAGE/HYSTEROSCOPY;  Surgeon: Aletha Halim, MD;  Location: [facility];  Service: Gynecology;  Laterality: N/A;  . DILATION AND CURETTAGE OF UTERUS    . ENDOMETRIAL ABLATION    . ESOPHAGOGASTRODUODENOSCOPY (EGD) WITH PROPOFOL N/A  04/13/2018   Procedure: ESOPHAGOGASTRODUODENOSCOPY (EGD) WITH PROPOFOL;  Surgeon: Virgel Manifold, MD;  Location: [facility];  Service: Endoscopy;  Laterality: N/A;  . ESOPHAGOGASTRODUODENOSCOPY (EGD) WITH PROPOFOL N/A 08/08/2018   Procedure: ESOPHAGOGASTRODUODENOSCOPY (EGD) WITH PROPOFOL;  Surgeon: Virgel Manifold, MD;  Location: [facility];  Service: Endoscopy;  Laterality: N/A;  . JOINT REPLACEMENT    . SPINAL FUSION     lumbar x2  . TONSILLECTOMY    . TOTAL KNEE ARTHROPLASTY Left 04/12/2019   Procedure: TOTAL KNEE ARTHROPLASTY;  Surgeon: Lovell Sheehan, MD;  Location: [facility];  Service: Orthopedics;  Laterality: Left;  . TOTAL KNEE ARTHROPLASTY Right 04/17/2020   Procedure: TOTAL KNEE ARTHROPLASTY;  Surgeon: Lovell Sheehan, MD;  Location: [facility];  Service: Orthopedics;  Laterality: Right;  . TUBAL LIGATION     Social History   Socioeconomic History  . Marital status: Legally Separated    Spouse name: Not on file  . Number of children: 4  . Years of education: Not on file  . Highest education level: 12th grade  Occupational History  . Occupation: disabled    Comment: chronic back pain   Tobacco Use  . Smoking status: Former Smoker    Packs/day: 0.25    Years: 20.00    Pack years: 5.00    Types: Cigarettes    Start date: 07/26/2002    Quit date: 04/06/2019    Years since quitting: 1.0  . Smokeless tobacco: Never Used  . Tobacco comment: Quit Date 04/06/2019  Vaping Use  .  Vaping Use: Never used  Substance and Sexual Activity  . Alcohol use: No    Alcohol/week: 0.0 standard drinks    Comment: occ  . Drug use: No  . Sexual activity: Yes    Partners: Male    Birth control/protection: Other-see comments    Comment: Ablation  Other Topics Concern  . Not on file  Social History Narrative  . Not on file   Social Determinants of Health   Financial Resource Strain:   . Difficulty of Paying Living Expenses:   Food Insecurity:   . Worried About Paediatric nurse in the Last Year:   . Arboriculturist in the Last Year:   Transportation Needs:   . Film/video editor (Medical):   Marland Kitchen Lack of Transportation (Non-Medical):   Physical Activity:   . Days of Exercise per Week:   . Minutes of Exercise per Session:   Stress: No Stress Concern Present  . Feeling of Stress : Only a little  Social Connections:   . Frequency of Communication with Friends and Family:   . Frequency of Social Gatherings with Friends and Family:   . Attends Religious Services:   . Active Member of Clubs or Organizations:   . Attends Archivist Meetings:   Marland Kitchen Marital Status:    Family History  Problem Relation Age of Onset  . Heart disease Mother   . Thyroid cancer Mother   . Depression Daughter   . Asthma Daughter   . Breast cancer Maternal Grandmother    No Known Allergies Prior to Admission medications   Medication Sig Start Date End Date Taking? Authorizing Provider  amitriptyline (ELAVIL) 10 MG tablet Take 1 tablet (10 mg total) by mouth daily. For IBS Patient taking differently: Take 10 mg by mouth at bedtime. For IBS 02/06/20  Yes Sowles, Drue Stager, MD  amLODipine (NORVASC) 5 MG tablet Take 1 tablet (5 mg total) by mouth daily. 02/06/20  Yes Sowles, Drue Stager, MD  atorvastatin (LIPITOR) 40 MG tablet Take 1 tablet (40 mg total) by mouth daily. 02/06/20  Yes Sowles, Drue Stager, MD  CHANTIX CONTINUING MONTH PAK 1 MG tablet Take 1 tablet (1 mg total) by mouth 2 (two) times daily. 02/06/20  Yes Sowles, Drue Stager, MD  cholecalciferol (VITAMIN D) 1000 units tablet Take 1,000 Units by mouth daily.   Yes [provider]  desvenlafaxine (PRISTIQ) 50 MG 24 hr tablet Take 1 tablet (50 mg total) by mouth daily. 02/06/20  Yes Sowles, Drue Stager, MD  gabapentin (NEURONTIN) 300 MG capsule Take 300 mg by mouth 2 (two) times daily.  06/12/19  Yes Shanon Ace, MD  losartan-hydrochlorothiazide (HYZAAR) 100-25 MG tablet TAKE ONE TABLET BY MOUTH ONCE DAILY Patient  taking differently: Take 1 tablet by mouth daily.  12/29/19  Yes Sowles, Drue Stager, MD  metFORMIN (GLUCOPHAGE) 500 MG tablet Take 1 tablet (500 mg total) by mouth daily with breakfast. 02/06/20  Yes Sowles, Drue Stager, MD  omeprazole (PRILOSEC) 20 MG capsule Take 1 capsule (20 mg total) by mouth daily. 02/06/20  Yes Sowles, Drue Stager, MD  QUEtiapine (SEROQUEL) 25 MG tablet Take 1 tablet (25 mg total) by mouth at bedtime. 02/06/20  Yes Sowles, Drue Stager, MD  tiZANidine (ZANAFLEX) 4 MG tablet Take 4 mg by mouth every 12 (twelve) hours.  05/07/15  Yes [provider]  valACYclovir (VALTREX) 1000 MG tablet TAKE ONE TABLET BY MOUTH TWICE DAILY AS NEEDED FOR OUTBREAK 04/09/20  Yes Sowles, Drue Stager, MD  docusate sodium (COLACE) 100 MG capsule  Take 1 capsule (100 mg total) by mouth 2 (two) times daily. 04/19/20   Carlynn Spry, PA-C  NARCAN 4 MG/0.1ML LIQD nasal spray kit OPIOD EMERGENCY 1 SPRAY IN 1 NOSTRIL 1 TIME, MAY REPEAT IN OTHER NOSTRIL EVERY 2 - 3 MINS UNTIL RESPONSIVE /EMS ARRIVES 03/14/19   Shanon Ace, MD  ondansetron (ZOFRAN) 4 MG tablet Take 1 tablet (4 mg total) by mouth every 6 (six) hours as needed for nausea. 04/19/20   Carlynn Spry, PA-C  rivaroxaban (XARELTO) 10 MG TABS tablet Take 1 tablet (10 mg total) by mouth daily with breakfast. 04/20/20   Carlynn Spry, PA-C     Positive ROS: All other systems have been reviewed and were otherwise negative with the exception of those mentioned in the HPI and as above.  Physical Exam: General: Alert, no acute distress Cardiovascular: Regular rate and rhythm, no murmurs rubs or gallops.  No pedal edema Respiratory: Clear to auscultation bilaterally, no wheezes rales or rhonchi. No cyanosis, no use of accessory musculature GI: No organomegaly, abdomen is soft and non-tender nondistended with positive bowel sounds. Skin: Skin intact, no lesions within the operative field. Neurologic: Sensation intact distally Psychiatric: Patient is competent for  consent with normal mood and affect Lymphatic: No axillary or cervical lymphadenopathy  MUSCULOSKELETAL: joint line tenderness, deformity and effusion  Assessment: M17.11 unilateral primary osteoarthritis, right knee  Plan: Plan for Procedure(s): TOTAL KNEE ARTHROPLASTY  I discussed the risks and benefits of surgery. The risks include but are not limited to infection, bleeding requiring blood transfusion, nerve or blood vessel injury, joint stiffness or loss of motion, persistent pain, weakness or instability, malunion, nonunion and hardware failure and the need for further surgery. Medical risks include but are not limited to DVT and pulmonary embolism, myocardial infarction, stroke, pneumonia, respiratory failure and death. Patient understood these risks and wished to proceed.   Lovell Sheehan, MD   05/11/2020 3:06 PM

## 2020-05-14 ENCOUNTER — Telehealth: Payer: Self-pay

## 2020-05-14 NOTE — Telephone Encounter (Signed)
Copied from [place] [id number]. Topic: General - Other >> May 14, 2020 10:51 AM Percell Belt A wrote: Reason for CRM: Np Beeze called in from pain management.  Stated they need proof that pt was prescribe oxycodone 10mg  for her knee replacement she had done.   Fax number [phone].   Number  [phone]

## 2020-05-14 NOTE — Telephone Encounter (Signed)
Patient returning call to [place].

## 2020-05-14 NOTE — Telephone Encounter (Signed)
Pt is calling for [facility] and would like callback  Concerning her medication

## 2020-05-14 NOTE — Telephone Encounter (Signed)
Information faxed. Confirmation confirmed.

## 2020-05-28 DIAGNOSIS — Z96652 Presence of left artificial knee joint: Secondary | ICD-10-CM | POA: Diagnosis not present

## 2020-05-28 DIAGNOSIS — M1711 Unilateral primary osteoarthritis, right knee: Secondary | ICD-10-CM | POA: Diagnosis not present

## 2020-05-28 DIAGNOSIS — M7061 Trochanteric bursitis, right hip: Secondary | ICD-10-CM | POA: Diagnosis not present

## 2020-05-31 ENCOUNTER — Other Ambulatory Visit: Payer: Self-pay | Admitting: Family Medicine

## 2020-05-31 DIAGNOSIS — F339 Major depressive disorder, recurrent, unspecified: Secondary | ICD-10-CM

## 2020-05-31 DIAGNOSIS — F5104 Psychophysiologic insomnia: Secondary | ICD-10-CM

## 2020-05-31 DIAGNOSIS — I10 Essential (primary) hypertension: Secondary | ICD-10-CM

## 2020-05-31 NOTE — Telephone Encounter (Signed)
Requested medication (s) are due for refill today:no  Requested medication (s) are on the active medication list:yes  Last refill:  05/04/2020  Future visit scheduled: yes  Notes to clinic: this refill cannot be delegated    Requested Prescriptions  Pending Prescriptions Disp Refills   QUEtiapine (SEROQUEL) 25 MG tablet [Pharmacy Med Name: quetiapine 25 mg tablet] 90 tablet 1    Sig: TAKE ONE TABLET BY MOUTH AT BEDTIME      Not Delegated - Psychiatry:  Antipsychotics - Second Generation (Atypical) - quetiapine Failed - 05/31/2020 10:54 AM      Failed - This refill cannot be delegated      Failed - ALT in normal range and within 180 days    ALT  Date Value Ref Range Status  08/01/2019 28 6 - 29 U/L Final          Failed - AST in normal range and within 180 days    AST  Date Value Ref Range Status  08/01/2019 29 10 - 35 U/L Final          Passed - Completed PHQ-2 or PHQ-9 in the last 360 days.      Passed - Last BP in normal range    BP Readings from Last 1 Encounters:  04/19/20 112/67          Passed - Valid encounter within last 6 months    Recent Outpatient Visits           3 months ago Major depression, recurrent, chronic ([place])   [facility] [place], Drue Stager, MD   6 months ago Fever blister   [facility] [place], Drue Stager, MD   10 months ago Anemia, unspecified type   [facility] Steele Sizer, MD   1 year ago Pre-op evaluation   [place], [place], [place]   1 year ago Major depression, recurrent, chronic [facility])   [facility] Steele Sizer, MD       Future Appointments             In 2 months  [facility], [place]   In 2 months Steele Sizer, MD [facility]             Signed Prescriptions Disp Refills   losartan-hydrochlorothiazide (HYZAAR) 100-25 MG tablet 90 tablet 1    Sig: TAKE ONE TABLET BY  MOUTH ONCE DAILY      Cardiovascular: ARB + Diuretic Combos Failed - 05/31/2020 10:54 AM      Failed - K in normal range and within 180 days    Potassium  Date Value Ref Range Status  04/18/2020 3.3 (L) 3.5 - 5.1 mmol/L Final          Passed - Na in normal range and within 180 days    Sodium  Date Value Ref Range Status  04/18/2020 139 135 - 145 mmol/L Final          Passed - Cr in normal range and within 180 days    Creat  Date Value Ref Range Status  08/01/2019 0.78 0.50 - 1.05 mg/dL Final    Comment:    For patients >28 years of age, the reference limit for Creatinine is approximately 13% higher for people identified as African-American. .    Creatinine, Ser  Date Value Ref Range Status  04/18/2020 0.77 0.44 - 1.00 mg/dL Final  Passed - Ca in normal range and within 180 days    Calcium  Date Value Ref Range Status  04/18/2020 9.6 8.9 - 10.3 mg/dL Final          Passed - Patient is not pregnant      Passed - Last BP in normal range    BP Readings from Last 1 Encounters:  04/19/20 112/67          Passed - Valid encounter within last 6 months    Recent Outpatient Visits           3 months ago Major depression, recurrent, chronic ([place])   [facility] Steele Sizer, MD   6 months ago Fever blister   [facility] Steele Sizer, MD   10 months ago Anemia, unspecified type   [facility] Steele Sizer, MD   1 year ago Pre-op evaluation   [place], [place], FNP   1 year ago Major depression, recurrent, chronic [facility])   [facility] Steele Sizer, MD       Future Appointments             In 2 months  [facility], [place]   In 2 months Steele Sizer, MD [facility], [facility]              omeprazole (PRILOSEC) 20 MG capsule 90 capsule 0    Sig: TAKE ONE CAPSULE BY MOUTH ONCE DAILY       Gastroenterology: Proton Pump Inhibitors Passed - 05/31/2020 10:54 AM      Passed - Valid encounter within last 12 months    Recent Outpatient Visits           3 months ago Major depression, recurrent, chronic ([employer])   [facility] Steele Sizer, MD   6 months ago Fever blister   [facility] Steele Sizer, MD   10 months ago Anemia, unspecified type   [facility] Steele Sizer, MD   1 year ago Pre-op evaluation   [place], [place]   1 year ago Major depression, recurrent, chronic [facility])   [facility] Steele Sizer, MD       Future Appointments             In 2 months  [place]   In 2 months Steele Sizer, MD [facility] [place], [facility]

## 2020-06-10 DIAGNOSIS — M545 Low back pain: Secondary | ICD-10-CM | POA: Diagnosis not present

## 2020-06-10 DIAGNOSIS — M25562 Pain in left knee: Secondary | ICD-10-CM | POA: Diagnosis not present

## 2020-06-10 DIAGNOSIS — M25512 Pain in left shoulder: Secondary | ICD-10-CM | POA: Diagnosis not present

## 2020-06-10 DIAGNOSIS — M25561 Pain in right knee: Secondary | ICD-10-CM | POA: Diagnosis not present

## 2020-06-10 DIAGNOSIS — M542 Cervicalgia: Secondary | ICD-10-CM | POA: Diagnosis not present

## 2020-06-10 DIAGNOSIS — M25551 Pain in right hip: Secondary | ICD-10-CM | POA: Diagnosis not present

## 2020-06-10 DIAGNOSIS — M170 Bilateral primary osteoarthritis of knee: Secondary | ICD-10-CM | POA: Diagnosis not present

## 2020-06-10 DIAGNOSIS — M79662 Pain in left lower leg: Secondary | ICD-10-CM | POA: Diagnosis not present

## 2020-06-10 DIAGNOSIS — M25552 Pain in left hip: Secondary | ICD-10-CM | POA: Diagnosis not present

## 2020-06-10 DIAGNOSIS — G894 Chronic pain syndrome: Secondary | ICD-10-CM | POA: Diagnosis not present

## 2020-06-10 DIAGNOSIS — M79661 Pain in right lower leg: Secondary | ICD-10-CM | POA: Diagnosis not present

## 2020-06-10 DIAGNOSIS — M25511 Pain in right shoulder: Secondary | ICD-10-CM | POA: Diagnosis not present

## 2020-07-09 DIAGNOSIS — M1711 Unilateral primary osteoarthritis, right knee: Secondary | ICD-10-CM | POA: Diagnosis not present

## 2020-07-09 DIAGNOSIS — Z96652 Presence of left artificial knee joint: Secondary | ICD-10-CM | POA: Diagnosis not present

## 2020-07-09 DIAGNOSIS — M7061 Trochanteric bursitis, right hip: Secondary | ICD-10-CM | POA: Diagnosis not present

## 2020-07-16 ENCOUNTER — Other Ambulatory Visit: Payer: Self-pay | Admitting: Family Medicine

## 2020-07-16 DIAGNOSIS — M25561 Pain in right knee: Secondary | ICD-10-CM | POA: Diagnosis not present

## 2020-07-16 DIAGNOSIS — M79661 Pain in right lower leg: Secondary | ICD-10-CM | POA: Diagnosis not present

## 2020-07-16 DIAGNOSIS — I10 Essential (primary) hypertension: Secondary | ICD-10-CM

## 2020-07-16 DIAGNOSIS — M545 Low back pain: Secondary | ICD-10-CM | POA: Diagnosis not present

## 2020-07-16 DIAGNOSIS — M170 Bilateral primary osteoarthritis of knee: Secondary | ICD-10-CM | POA: Diagnosis not present

## 2020-07-16 DIAGNOSIS — G890 Central pain syndrome: Secondary | ICD-10-CM | POA: Diagnosis not present

## 2020-07-16 DIAGNOSIS — M25511 Pain in right shoulder: Secondary | ICD-10-CM | POA: Diagnosis not present

## 2020-07-16 DIAGNOSIS — M25562 Pain in left knee: Secondary | ICD-10-CM | POA: Diagnosis not present

## 2020-07-16 DIAGNOSIS — M25552 Pain in left hip: Secondary | ICD-10-CM | POA: Diagnosis not present

## 2020-07-16 DIAGNOSIS — M79662 Pain in left lower leg: Secondary | ICD-10-CM | POA: Diagnosis not present

## 2020-07-16 DIAGNOSIS — M25512 Pain in left shoulder: Secondary | ICD-10-CM | POA: Diagnosis not present

## 2020-07-16 DIAGNOSIS — M542 Cervicalgia: Secondary | ICD-10-CM | POA: Diagnosis not present

## 2020-07-16 DIAGNOSIS — M25551 Pain in right hip: Secondary | ICD-10-CM | POA: Diagnosis not present

## 2020-08-01 ENCOUNTER — Ambulatory Visit: Payer: Medicare Other

## 2020-08-05 NOTE — Progress Notes (Signed)
Name: [name]   MRN: [id number]    DOB: 04-29-1961   Date:08/08/2020       Progress Note  Subjective  Chief Complaint  Chief Complaint  Patient presents with  . Follow-up    HPI   OA both knees:she has a TKA of left knee 04/2019 by Dr. Harlow Mares. She had TKR on right side June 16  th, 2021. She had one PT session and has been doing home exercises since. She went back for follow up and is doing well, she has mild soreness on right knee incision but doing better and has good rom   Metabolic syndrome:weight is stable since last visit. She is still taking Metformin. She denies polyphagia, polydipsia or polyuria. Continue life style modification.Last hgbA1C was normal. Trying to eat balanced meals   HTN:she has not been checking her bp at home lately. No chest pain or palpitation. BP today is at goal.Continue current regiment    Depression Major:long history of depression, got worse when mother died in 2016/12/27, but seems to be doing okay at this time, Still medications as prescribed, denies suicidal thoughts or ideation. Phq 9 is down from 11 to 3 and today is 2. She is compliant with medication   COPD:Shequit smoking 04/2019 with Chantix but was using to help with cravings, but it has been pulled from the market. She denies any wheezing, cough or SOB at this time.   IBS: doing well at this time, pain under control, she has diarrhea and constipation typeNo recent flares, doing well at this time . Taking Elavil   GERD: under control with medications  Hot flashes: severe , discussed HRT , discussed WHI, she is not willing to take it because of cost   Patient Active Problem List   Diagnosis Date Noted  . History of total knee arthroplasty, right 04/17/2020  . S/P TKR (total knee replacement) using cement, left 04/12/2019  . Overweight (BMI 25.0-29.9) 04/06/2019  . Peptic ulcer   . Benign neoplasm of ascending colon   . Diverticulosis of large intestine without  diverticulitis   . Simple chronic bronchitis ([place]) 08/03/2018  . Pancreatic lesion 04/21/2018  . Abdominal aortic atherosclerosis ([place]) 04/21/2018  . Acute peptic ulcer of stomach   . Gastric polyp   . Abdominal pain, epigastric   . Trigger thumb of right hand 02/25/2018  . Carpal tunnel syndrome on both sides 10/21/2017  . CRP elevated 08/09/2017  . Elevated C-reactive protein 07/28/2017  . Osteoarthritis of both knees 02/19/2016  . COPD, mild ([place]) 05/08/2015  . Vitamin D deficiency 05/07/2015  . Metabolic syndrome 80/01/4916  . Eczema 05/07/2015  . Chronic insomnia 05/07/2015  . Hypertension, benign 05/07/2015  . Dyslipidemia 05/07/2015  . Hyperglycemia 05/07/2015  . Chronic low back pain 05/07/2015  . IBS (irritable bowel syndrome) 05/07/2015  . Gastroesophageal reflux disease without esophagitis 05/07/2015  . Major depression, chronic ([place]) 05/07/2015  . Menopausal syndrome (hot flashes) 05/07/2015  . History of postmenopausal bleeding 01/14/2015    Past Surgical History:  Procedure Laterality Date  . BACK SURGERY    . BREAST BIOPSY Left    neg  . BREAST LUMPECTOMY Left   . CARPAL TUNNEL RELEASE Left 08/15/2018   Procedure: CARPAL TUNNEL RELEASE;  Surgeon: Earnestine Leys, MD;  Location: [facility];  Service: Orthopedics;  Laterality: Left;  . CERVICAL DISCECTOMY     x 2; metal plate  . CHOLECYSTECTOMY    . COLONOSCOPY WITH PROPOFOL N/A 08/08/2018   Procedure: COLONOSCOPY  WITH PROPOFOL;  Surgeon: Virgel Manifold, MD;  Location: [facility];  Service: Endoscopy;  Laterality: N/A;  . DILATATION & CURETTAGE/HYSTEROSCOPY WITH MYOSURE N/A 03/21/2015   Procedure: DILATATION & CURETTAGE/HYSTEROSCOPY;  Surgeon: Aletha Halim, MD;  Location: [facility];  Service: Gynecology;  Laterality: N/A;  . DILATION AND CURETTAGE OF UTERUS    . ENDOMETRIAL ABLATION    . ESOPHAGOGASTRODUODENOSCOPY (EGD) WITH PROPOFOL N/A 04/13/2018   Procedure: ESOPHAGOGASTRODUODENOSCOPY (EGD) WITH  PROPOFOL;  Surgeon: Virgel Manifold, MD;  Location: [facility];  Service: Endoscopy;  Laterality: N/A;  . ESOPHAGOGASTRODUODENOSCOPY (EGD) WITH PROPOFOL N/A 08/08/2018   Procedure: ESOPHAGOGASTRODUODENOSCOPY (EGD) WITH PROPOFOL;  Surgeon: Virgel Manifold, MD;  Location: [facility];  Service: Endoscopy;  Laterality: N/A;  . JOINT REPLACEMENT    . SPINAL FUSION     lumbar x2  . TONSILLECTOMY    . TOTAL KNEE ARTHROPLASTY Left 04/12/2019   Procedure: TOTAL KNEE ARTHROPLASTY;  Surgeon: Lovell Sheehan, MD;  Location: [facility];  Service: Orthopedics;  Laterality: Left;  . TOTAL KNEE ARTHROPLASTY Right 04/17/2020   Procedure: TOTAL KNEE ARTHROPLASTY;  Surgeon: Lovell Sheehan, MD;  Location: [facility];  Service: Orthopedics;  Laterality: Right;  . TUBAL LIGATION      Family History  Problem Relation Age of Onset  . Heart disease Mother   . Thyroid cancer Mother   . Depression Daughter   . Asthma Daughter   . Breast cancer Maternal Grandmother     Social History   Tobacco Use  . Smoking status: Former Smoker    Packs/day: 0.25    Years: 20.00    Pack years: 5.00    Types: Cigarettes    Start date: 07/26/2002    Quit date: 04/06/2019    Years since quitting: 1.3  . Smokeless tobacco: Never Used  . Tobacco comment: Quit Date 04/06/2019  Substance Use Topics  . Alcohol use: No    Alcohol/week: 0.0 standard drinks    Comment: occ     Current Outpatient Medications:  .  amitriptyline (ELAVIL) 10 MG tablet, Take 1 tablet (10 mg total) by mouth at bedtime. For IBS, Disp: 90 tablet, Rfl: 1 .  amLODipine (NORVASC) 5 MG tablet, TAKE ONE TABLET BY MOUTH ONCE DAILY, Disp: 90 tablet, Rfl: 1 .  atorvastatin (LIPITOR) 40 MG tablet, Take 1 tablet (40 mg total) by mouth daily., Disp: 90 tablet, Rfl: 1 .  cholecalciferol (VITAMIN D) 1000 units tablet, Take 1,000 Units by mouth daily., Disp: , Rfl:  .  desvenlafaxine (PRISTIQ) 50 MG 24 hr tablet, Take 1 tablet (50 mg total) by mouth  daily., Disp: 90 tablet, Rfl: 1 .  gabapentin (NEURONTIN) 300 MG capsule, Take 300 mg by mouth 2 (two) times daily. , Disp: , Rfl:  .  losartan-hydrochlorothiazide (HYZAAR) 100-25 MG tablet, TAKE ONE TABLET BY MOUTH ONCE DAILY, Disp: 90 tablet, Rfl: 1 .  metFORMIN (GLUCOPHAGE) 500 MG tablet, Take 1 tablet (500 mg total) by mouth daily with breakfast., Disp: 90 tablet, Rfl: 1 .  NARCAN 4 MG/0.1ML LIQD nasal spray kit, OPIOD EMERGENCY 1 SPRAY IN 1 NOSTRIL 1 TIME, MAY REPEAT IN OTHER NOSTRIL EVERY 2 - 3 MINS UNTIL RESPONSIVE /EMS ARRIVES, Disp: , Rfl:  .  omeprazole (PRILOSEC) 20 MG capsule, Take 1 capsule (20 mg total) by mouth daily., Disp: 90 capsule, Rfl: 1 .  QUEtiapine (SEROQUEL) 25 MG tablet, Take 1 tablet (25 mg total) by mouth at bedtime., Disp: 90 tablet, Rfl: 1 .  tiZANidine (ZANAFLEX)  4 MG tablet, Take 4 mg by mouth every 12 (twelve) hours. , Disp: , Rfl:  .  valACYclovir (VALTREX) 1000 MG tablet, Take 1 tablet (1,000 mg total) by mouth 2 (two) times daily., Disp: 6 tablet, Rfl: 1 .  meloxicam (MOBIC) 15 MG tablet, , Disp: , Rfl:  .  Oxycodone HCl 20 MG TABS, Take 1 tablet by mouth 4 (four) times daily., Disp: , Rfl:   No Known Allergies  I personally reviewed active problem list, medication list, allergies, family history, social history, health maintenance with the patient/caregiver today.   ROS  Constitutional: Negative for fever or weight change.  Respiratory: Negative for cough and shortness of breath.   Cardiovascular: Negative for chest pain or palpitations.  Gastrointestinal: Negative for abdominal pain, no bowel changes.  Musculoskeletal: Negative for gait problem or joint swelling.  Skin: Negative for rash.  Neurological: Negative for dizziness or headache.  No other specific complaints in a complete review of systems (except as listed in HPI above).  Objective  Vitals:   08/07/20 1152  BP: 118/80  Pulse: 88  Resp: 18  Temp: 97.9 F (36.6 C)  SpO2: 99%   Weight: 200 lb 14.4 oz (91.1 kg)  Height: _0  (1.753 m)    Body mass index is 29.67 kg/m.  Physical Exam  Constitutional: Patient appears well-developed and well-nourished. Overweight.  No distress.  HEENT: head atraumatic, normocephalic, pupils equal and reactive to light,  neck supple Cardiovascular: Normal rate, regular rhythm and normal heart sounds.  No murmur heard. No BLE edema. Pulmonary/Chest: Effort normal and breath sounds normal. No respiratory distress. Abdominal: Soft.  There is no tenderness. Psychiatric: Patient has a normal mood and affect. behavior is normal. Judgment and thought content normal. Muscular skeletal: good rom of both knees  PHQ2/9: Depression screen [facility] 2/9 08/07/2020 02/06/2020 12/01/2019 08/01/2019 07/13/2019  Decreased Interest 0 0 2 0 0  Down, Depressed, Hopeless 1 1 0 0 0  PHQ - 2 Score _1 0 0  Altered sleeping 0 1 3 - 3  Tired, decreased energy 0 1 1 - 1  Change in appetite 0 0 3 - 0  Feeling bad or failure about yourself  1 0 2 - 0  Trouble concentrating 0 0 0 - 0  Moving slowly or fidgety/restless 0 0 0 - 0  Suicidal thoughts 0 0 0 - 0  PHQ-9 Score _2 - 4  Difficult doing work/chores Not difficult at all Not difficult at all Not difficult at all - Not difficult at all  Some recent data might be hidden    phq 9 is positive   Fall Risk: Fall Risk  08/07/2020 02/06/2020 08/01/2019 07/13/2019 04/06/2019  Falls in the past year? 0 _3 0  Number falls in past yr: 0 0 1 0 0  Injury with Fall? 0 0 0 0 0  Risk for fall due to : - - History of fall(s) - -  Risk for fall due to: Comment - - - - -  Follow up - - Falls prevention discussed - Falls evaluation completed     Functional Status Survey: Is the patient deaf or have difficulty hearing?: No Does the patient have difficulty seeing, even when wearing glasses/contacts?: No Does the patient have difficulty concentrating, remembering, or making decisions?: No Does the patient have  difficulty walking or climbing stairs?: Yes Does the patient have difficulty dressing or bathing?: No Does the patient have difficulty doing errands alone  such as visiting a doctor's office or shopping?: No    Assessment & Plan  1. Need for immunization against influenza  - Flu Vaccine QUAD 36+ mos IM  2. Insulin resistance  - Hemoglobin A1c - metFORMIN (GLUCOPHAGE) 500 MG tablet; Take 1 tablet (500 mg total) by mouth daily with breakfast.  Dispense: 90 tablet; Refill: 1  3. Metabolic syndrome  - metFORMIN (GLUCOPHAGE) 500 MG tablet; Take 1 tablet (500 mg total) by mouth daily with breakfast.  Dispense: 90 tablet; Refill: 1  4. Abdominal aortic atherosclerosis (H[facility])  - Lipid panel  5. Major depression, recurrent, chronic (HCC)  - desvenlafaxine (PRISTIQ) 50 MG 24 hr tablet; Take 1 tablet (50 mg total) by mouth daily.  Dispense: 90 tablet; Refill: 1 - QUEtiapine (SEROQUEL) 25 MG tablet; Take 1 tablet (25 mg total) by mouth at bedtime.  Dispense: 90 tablet; Refill: 1  6. COPD, mild ([place])   7. Hypertension, benign  - CBC with Differential/Platelet - COMPLETE METABOLIC PANEL WITH GFR  8. Irritable bowel syndrome with both constipation and diarrhea  - amitriptyline (ELAVIL) 10 MG tablet; Take 1 tablet (10 mg total) by mouth at bedtime. For IBS  Dispense: 90 tablet; Refill: 1  9. Chronic insomnia  - QUEtiapine (SEROQUEL) 25 MG tablet; Take 1 tablet (25 mg total) by mouth at bedtime.  Dispense: 90 tablet; Refill: 1  10. Anemia, unspecified type  - CBC with Differential/Platelet  11. Hyperglycemia  - Hemoglobin A1c  12. Fever blister  - valACYclovir (VALTREX) 1000 MG tablet; Take 1 tablet (1,000 mg total) by mouth 2 (two) times daily.  Dispense: 6 tablet; Refill: 1  13. Hot flashes due to menopause

## 2020-08-07 ENCOUNTER — Ambulatory Visit (INDEPENDENT_AMBULATORY_CARE_PROVIDER_SITE_OTHER): Payer: Medicare Other | Admitting: Family Medicine

## 2020-08-07 ENCOUNTER — Encounter: Payer: Self-pay | Admitting: Family Medicine

## 2020-08-07 ENCOUNTER — Other Ambulatory Visit: Payer: Self-pay

## 2020-08-07 VITALS — BP 118/80 | HR 88 | Temp 97.9°F | Resp 18 | Ht 69.0 in | Wt 200.9 lb

## 2020-08-07 DIAGNOSIS — N951 Menopausal and female climacteric states: Secondary | ICD-10-CM

## 2020-08-07 DIAGNOSIS — B001 Herpesviral vesicular dermatitis: Secondary | ICD-10-CM | POA: Diagnosis not present

## 2020-08-07 DIAGNOSIS — R739 Hyperglycemia, unspecified: Secondary | ICD-10-CM | POA: Diagnosis not present

## 2020-08-07 DIAGNOSIS — I700 Atherosclerosis of aorta: Secondary | ICD-10-CM | POA: Diagnosis not present

## 2020-08-07 DIAGNOSIS — I10 Essential (primary) hypertension: Secondary | ICD-10-CM | POA: Diagnosis not present

## 2020-08-07 DIAGNOSIS — E8881 Metabolic syndrome: Secondary | ICD-10-CM | POA: Diagnosis not present

## 2020-08-07 DIAGNOSIS — F5104 Psychophysiologic insomnia: Secondary | ICD-10-CM | POA: Diagnosis not present

## 2020-08-07 DIAGNOSIS — K582 Mixed irritable bowel syndrome: Secondary | ICD-10-CM

## 2020-08-07 DIAGNOSIS — E88819 Insulin resistance, unspecified: Secondary | ICD-10-CM

## 2020-08-07 DIAGNOSIS — Z23 Encounter for immunization: Secondary | ICD-10-CM

## 2020-08-07 DIAGNOSIS — F339 Major depressive disorder, recurrent, unspecified: Secondary | ICD-10-CM

## 2020-08-07 DIAGNOSIS — D649 Anemia, unspecified: Secondary | ICD-10-CM | POA: Diagnosis not present

## 2020-08-07 DIAGNOSIS — J449 Chronic obstructive pulmonary disease, unspecified: Secondary | ICD-10-CM | POA: Diagnosis not present

## 2020-08-07 MED ORDER — VALACYCLOVIR HCL 1 G PO TABS: 1000.0000 mg | ORAL_TABLET | Freq: Two times a day (BID) | ORAL | 1 refills | Status: DC

## 2020-08-07 MED ORDER — DESVENLAFAXINE SUCCINATE ER 50 MG PO TB24: 50.0000 mg | ORAL_TABLET | Freq: Every day | ORAL | 1 refills | Status: DC

## 2020-08-07 MED ORDER — AMITRIPTYLINE HCL 10 MG PO TABS: 10.0000 mg | ORAL_TABLET | Freq: Every day | ORAL | 1 refills | Status: DC

## 2020-08-07 MED ORDER — OMEPRAZOLE 20 MG PO CPDR: 20.0000 mg | DELAYED_RELEASE_CAPSULE | Freq: Every day | ORAL | 1 refills | Status: DC

## 2020-08-07 MED ORDER — QUETIAPINE FUMARATE 25 MG PO TABS: 25.0000 mg | ORAL_TABLET | Freq: Every day | ORAL | 1 refills | Status: DC

## 2020-08-07 MED ORDER — METFORMIN HCL 500 MG PO TABS: 500.0000 mg | ORAL_TABLET | Freq: Every day | ORAL | 1 refills | Status: DC

## 2020-08-08 LAB — COMPLETE METABOLIC PANEL WITH GFR
AG Ratio: 1.7 (calc) (ref 1.0–2.5)
ALT: 16 U/L (ref 6–29)
AST: 17 U/L (ref 10–35)
Albumin: 4.2 g/dL (ref 3.6–5.1)
Alkaline phosphatase (APISO): 105 U/L (ref 37–153)
BUN: 12 mg/dL (ref 7–25)
CO2: 28 mmol/L (ref 20–32)
Calcium: 10.2 mg/dL (ref 8.6–10.4)
Chloride: 104 mmol/L (ref 98–110)
Creat: 0.59 mg/dL (ref 0.50–1.05)
GFR, Est African American: 118 mL/min/{1.73_m2} (ref >=60)
GFR, Est Non African American: 102 mL/min/{1.73_m2} (ref >=60)
Globulin: 2.5 g/dL (calc) (ref 1.9–3.7)
Glucose, Bld: 93 mg/dL (ref 65–99)
Potassium: 4.9 mmol/L (ref 3.5–5.3)
Sodium: 145 mmol/L (ref 135–146)
Total Bilirubin: 0.4 mg/dL (ref 0.2–1.2)
Total Protein: 6.7 g/dL (ref 6.1–8.1)

## 2020-08-08 LAB — CBC WITH DIFFERENTIAL/PLATELET
Absolute Monocytes: 482 cells/uL (ref 200–950)
Basophils Absolute: 7 cells/uL (ref 0–200)
Basophils Relative: 0.1 %
Eosinophils Absolute: 67 cells/uL (ref 15–500)
Eosinophils Relative: 1 %
HCT: 37.5 % (ref 35.0–45.0)
Hemoglobin: 12.4 g/dL (ref 11.7–15.5)
Lymphs Abs: 1997 cells/uL (ref 850–3900)
MCH: 31.2 pg (ref 27.0–33.0)
MCHC: 33.1 g/dL (ref 32.0–36.0)
MCV: 94.2 fL (ref 80.0–100.0)
MPV: 10.7 fL (ref 7.5–12.5)
Monocytes Relative: 7.2 %
Neutro Abs: 4147 cells/uL (ref 1500–7800)
Neutrophils Relative %: 61.9 %
Platelets: 301 10*3/uL (ref 140–400)
RBC: 3.98 10*6/uL (ref 3.80–5.10)
RDW: 13.1 % (ref 11.0–15.0)
Total Lymphocyte: 29.8 %
WBC: 6.7 10*3/uL (ref 3.8–10.8)

## 2020-08-08 LAB — HEMOGLOBIN A1C
Hgb A1c MFr Bld: 5.8 % of total Hgb — ABNORMAL HIGH (ref <5.7)
Mean Plasma Glucose: 120 (calc)
eAG (mmol/L): 6.6 (calc)

## 2020-08-08 LAB — LIPID PANEL
Cholesterol: 104 mg/dL (ref <200)
HDL: 56 mg/dL (ref >=50)
LDL Cholesterol (Calc): 34 mg/dL (calc)
Non-HDL Cholesterol (Calc): 48 mg/dL (calc) (ref <130)
Total CHOL/HDL Ratio: 1.9 (calc) (ref <5.0)
Triglycerides: 67 mg/dL (ref <150)

## 2020-08-13 DIAGNOSIS — M25552 Pain in left hip: Secondary | ICD-10-CM | POA: Diagnosis not present

## 2020-08-13 DIAGNOSIS — M79662 Pain in left lower leg: Secondary | ICD-10-CM | POA: Diagnosis not present

## 2020-08-13 DIAGNOSIS — M5431 Sciatica, right side: Secondary | ICD-10-CM | POA: Diagnosis not present

## 2020-08-13 DIAGNOSIS — M542 Cervicalgia: Secondary | ICD-10-CM | POA: Diagnosis not present

## 2020-08-13 DIAGNOSIS — M25562 Pain in left knee: Secondary | ICD-10-CM | POA: Diagnosis not present

## 2020-08-13 DIAGNOSIS — M25511 Pain in right shoulder: Secondary | ICD-10-CM | POA: Diagnosis not present

## 2020-08-13 DIAGNOSIS — M25561 Pain in right knee: Secondary | ICD-10-CM | POA: Diagnosis not present

## 2020-08-13 DIAGNOSIS — M25551 Pain in right hip: Secondary | ICD-10-CM | POA: Diagnosis not present

## 2020-08-13 DIAGNOSIS — M545 Low back pain, unspecified: Secondary | ICD-10-CM | POA: Diagnosis not present

## 2020-08-13 DIAGNOSIS — M25512 Pain in left shoulder: Secondary | ICD-10-CM | POA: Diagnosis not present

## 2020-08-13 DIAGNOSIS — M79661 Pain in right lower leg: Secondary | ICD-10-CM | POA: Diagnosis not present

## 2020-08-13 DIAGNOSIS — M170 Bilateral primary osteoarthritis of knee: Secondary | ICD-10-CM | POA: Diagnosis not present

## 2020-08-26 ENCOUNTER — Other Ambulatory Visit: Payer: Self-pay | Admitting: Family Medicine

## 2020-08-26 DIAGNOSIS — E8881 Metabolic syndrome: Secondary | ICD-10-CM

## 2020-08-26 DIAGNOSIS — E88819 Insulin resistance, unspecified: Secondary | ICD-10-CM

## 2020-08-29 ENCOUNTER — Other Ambulatory Visit: Payer: Self-pay | Admitting: Family Medicine

## 2020-08-29 DIAGNOSIS — F339 Major depressive disorder, recurrent, unspecified: Secondary | ICD-10-CM

## 2020-09-13 DIAGNOSIS — M25562 Pain in left knee: Secondary | ICD-10-CM | POA: Diagnosis not present

## 2020-09-13 DIAGNOSIS — M79661 Pain in right lower leg: Secondary | ICD-10-CM | POA: Diagnosis not present

## 2020-09-13 DIAGNOSIS — M79662 Pain in left lower leg: Secondary | ICD-10-CM | POA: Diagnosis not present

## 2020-09-13 DIAGNOSIS — M25511 Pain in right shoulder: Secondary | ICD-10-CM | POA: Diagnosis not present

## 2020-09-13 DIAGNOSIS — M25512 Pain in left shoulder: Secondary | ICD-10-CM | POA: Diagnosis not present

## 2020-09-13 DIAGNOSIS — M25551 Pain in right hip: Secondary | ICD-10-CM | POA: Diagnosis not present

## 2020-09-13 DIAGNOSIS — M25552 Pain in left hip: Secondary | ICD-10-CM | POA: Diagnosis not present

## 2020-09-13 DIAGNOSIS — M542 Cervicalgia: Secondary | ICD-10-CM | POA: Diagnosis not present

## 2020-09-13 DIAGNOSIS — M25561 Pain in right knee: Secondary | ICD-10-CM | POA: Diagnosis not present

## 2020-09-13 DIAGNOSIS — M5432 Sciatica, left side: Secondary | ICD-10-CM | POA: Diagnosis not present

## 2020-09-13 DIAGNOSIS — M545 Low back pain, unspecified: Secondary | ICD-10-CM | POA: Diagnosis not present

## 2020-09-13 DIAGNOSIS — M5431 Sciatica, right side: Secondary | ICD-10-CM | POA: Diagnosis not present

## 2020-09-17 ENCOUNTER — Telehealth: Payer: Self-pay | Admitting: Family Medicine

## 2020-09-17 NOTE — Telephone Encounter (Signed)
Copied from [place] [id number]. Topic: Medicare AWV >> Sep 17, 2020 12:29 PM Cher Nakai R wrote: Reason for CRM:  Left message for patient to call back and schedule Medicare Annual Wellness Visit (AWV) in office.   If not able to come in office, please offer to do virtually.   Last AWV  08/01/2019  Please schedule at anytime with [place].  40 minute appointment  Any questions, please contact me at [phone]

## 2020-10-07 ENCOUNTER — Other Ambulatory Visit: Payer: Self-pay | Admitting: Family Medicine

## 2020-10-07 DIAGNOSIS — I10 Essential (primary) hypertension: Secondary | ICD-10-CM

## 2020-10-15 DIAGNOSIS — G894 Chronic pain syndrome: Secondary | ICD-10-CM | POA: Diagnosis not present

## 2020-10-15 DIAGNOSIS — M25512 Pain in left shoulder: Secondary | ICD-10-CM | POA: Diagnosis not present

## 2020-10-15 DIAGNOSIS — M5431 Sciatica, right side: Secondary | ICD-10-CM | POA: Diagnosis not present

## 2020-10-15 DIAGNOSIS — M25551 Pain in right hip: Secondary | ICD-10-CM | POA: Diagnosis not present

## 2020-10-15 DIAGNOSIS — M25562 Pain in left knee: Secondary | ICD-10-CM | POA: Diagnosis not present

## 2020-10-15 DIAGNOSIS — M79661 Pain in right lower leg: Secondary | ICD-10-CM | POA: Diagnosis not present

## 2020-10-15 DIAGNOSIS — M79662 Pain in left lower leg: Secondary | ICD-10-CM | POA: Diagnosis not present

## 2020-10-15 DIAGNOSIS — M170 Bilateral primary osteoarthritis of knee: Secondary | ICD-10-CM | POA: Diagnosis not present

## 2020-10-15 DIAGNOSIS — M25561 Pain in right knee: Secondary | ICD-10-CM | POA: Diagnosis not present

## 2020-10-15 DIAGNOSIS — M5432 Sciatica, left side: Secondary | ICD-10-CM | POA: Diagnosis not present

## 2020-10-15 DIAGNOSIS — M25552 Pain in left hip: Secondary | ICD-10-CM | POA: Diagnosis not present

## 2020-10-15 DIAGNOSIS — M25511 Pain in right shoulder: Secondary | ICD-10-CM | POA: Diagnosis not present

## 2020-10-17 ENCOUNTER — Other Ambulatory Visit: Payer: Self-pay | Admitting: Family Medicine

## 2020-10-17 DIAGNOSIS — I10 Essential (primary) hypertension: Secondary | ICD-10-CM

## 2020-10-29 DIAGNOSIS — M5431 Sciatica, right side: Secondary | ICD-10-CM | POA: Diagnosis not present

## 2020-10-29 DIAGNOSIS — M25552 Pain in left hip: Secondary | ICD-10-CM | POA: Diagnosis not present

## 2020-10-29 DIAGNOSIS — M79661 Pain in right lower leg: Secondary | ICD-10-CM | POA: Diagnosis not present

## 2020-10-29 DIAGNOSIS — M545 Low back pain, unspecified: Secondary | ICD-10-CM | POA: Diagnosis not present

## 2020-10-29 DIAGNOSIS — M79662 Pain in left lower leg: Secondary | ICD-10-CM | POA: Diagnosis not present

## 2020-10-29 DIAGNOSIS — M25561 Pain in right knee: Secondary | ICD-10-CM | POA: Diagnosis not present

## 2020-10-29 DIAGNOSIS — M542 Cervicalgia: Secondary | ICD-10-CM | POA: Diagnosis not present

## 2020-10-29 DIAGNOSIS — M5432 Sciatica, left side: Secondary | ICD-10-CM | POA: Diagnosis not present

## 2020-10-29 DIAGNOSIS — M25551 Pain in right hip: Secondary | ICD-10-CM | POA: Diagnosis not present

## 2020-10-29 DIAGNOSIS — M25562 Pain in left knee: Secondary | ICD-10-CM | POA: Diagnosis not present

## 2020-10-29 DIAGNOSIS — M25511 Pain in right shoulder: Secondary | ICD-10-CM | POA: Diagnosis not present

## 2020-10-29 DIAGNOSIS — M25512 Pain in left shoulder: Secondary | ICD-10-CM | POA: Diagnosis not present

## 2020-11-05 ENCOUNTER — Ambulatory Visit (INDEPENDENT_AMBULATORY_CARE_PROVIDER_SITE_OTHER): Payer: Medicare Other

## 2020-11-05 DIAGNOSIS — Z0000 Encounter for general adult medical examination without abnormal findings: Secondary | ICD-10-CM

## 2020-11-05 NOTE — Patient Instructions (Signed)
Ms. [name] , Thank you for taking time to come for your Medicare Wellness Visit. I appreciate your ongoing commitment to your health goals. Please review the following plan we discussed and let me know if I can assist you in the future.   Screening recommendations/referrals: Colonoscopy: done 08/08/18; repeat 08/2021 Mammogram: done 12/19/19 Bone Density: due age 60 Recommended yearly ophthalmology/optometry visit for glaucoma screening and checkup Recommended yearly dental visit for hygiene and checkup  Vaccinations: Influenza vaccine: done 08/07/20 Pneumococcal vaccine: done 08/23/12 Tdap vaccine: due Shingles vaccine: Shingrix discussed. Please contact your pharmacy for coverage information.  Covid-19:  Done 03/13/20 & 04/03/20  Advanced directives: Advance directive discussed with you today. Even though you declined this today please call our office should you change your mind and we can give you the proper paperwork for you to fill out.  Conditions/risks identified: If you wish to quit smoking, help is available. For free tobacco cessation program offerings call the [facility] at (651) 194-1227 or Live Well Line at [phone]. You may also visit [email or web address] or email [email or web address] for more information on other programs.   Next appointment: Follow up in one year for your annual wellness visit.   Preventive Care 40-64 Years, Female Preventive care refers to lifestyle choices and visits with your health care provider that can promote health and wellness. What does preventive care include?  A yearly physical exam. This is also called an annual well check.  Dental exams once or twice a year.  Routine eye exams. Ask your health care provider how often you should have your eyes checked.  Personal lifestyle choices, including:  Daily care of your teeth and gums.  Regular physical activity.  Eating a healthy diet.  Avoiding tobacco and drug  use.  Limiting alcohol use.  Practicing safe sex.  Taking low-dose aspirin daily starting at age 66.  Taking vitamin and mineral supplements as recommended by your health care provider. What happens during an annual well check? The services and screenings done by your health care provider during your annual well check will depend on your age, overall health, lifestyle risk factors, and family history of disease. Counseling  Your health care provider may ask you questions about your:  Alcohol use.  Tobacco use.  Drug use.  Emotional well-being.  Home and relationship well-being.  Sexual activity.  Eating habits.  Work and work Statistician.  Method of birth control.  Menstrual cycle.  Pregnancy history. Screening  You may have the following tests or measurements:  Height, weight, and BMI.  Blood pressure.  Lipid and cholesterol levels. These may be checked every 5 years, or more frequently if you are over 39 years old.  Skin check.  Lung cancer screening. You may have this screening every year starting at age 36 if you have a 30-pack-year history of smoking and currently smoke or have quit within the past 15 years.  Fecal occult blood test (FOBT) of the stool. You may have this test every year starting at age 26.  Flexible sigmoidoscopy or colonoscopy. You may have a sigmoidoscopy every 5 years or a colonoscopy every 10 years starting at age 23.  Hepatitis C blood test.  Hepatitis B blood test.  Sexually transmitted disease (STD) testing.  Diabetes screening. This is done by checking your blood sugar (glucose) after you have not eaten for a while (fasting). You may have this done every 1-3 years.  Mammogram. This may be done every 1-2 years. Talk to your  health care provider about when you should start having regular mammograms. This may depend on whether you have a family history of breast cancer.  BRCA-related cancer screening. This may be done if you  have a family history of breast, ovarian, tubal, or peritoneal cancers.  Pelvic exam and Pap test. This may be done every 3 years starting at age 66. Starting at age 41, this may be done every 5 years if you have a Pap test in combination with an HPV test.  Bone density scan. This is done to screen for osteoporosis. You may have this scan if you are at high risk for osteoporosis. Discuss your test results, treatment options, and if necessary, the need for more tests with your health care provider. Vaccines  Your health care provider may recommend certain vaccines, such as:  Influenza vaccine. This is recommended every year.  Tetanus, diphtheria, and acellular pertussis (Tdap, Td) vaccine. You may need a Td booster every 10 years.  Zoster vaccine. You may need this after age 1.  Pneumococcal 13-valent conjugate (PCV13) vaccine. You may need this if you have certain conditions and were not previously vaccinated.  Pneumococcal polysaccharide (PPSV23) vaccine. You may need one or two doses if you smoke cigarettes or if you have certain conditions. Talk to your health care provider about which screenings and vaccines you need and how often you need them. This information is not intended to replace advice given to you by your health care provider. Make sure you discuss any questions you have with your health care provider. Document Released: 11/15/2015 Document Revised: 07/08/2016 Document Reviewed: 08/20/2015 Elsevier Interactive Patient Education  2017 [place] Prevention in the Home Falls can cause injuries. They can happen to people of all ages. There are many things you can do to make your home safe and to help prevent falls. What can I do on the outside of my home?  Regularly fix the edges of walkways and driveways and fix any cracks.  Remove anything that might make you trip as you walk through a door, such as a raised step or threshold.  Trim any bushes or trees on  the path to your home.  Use bright outdoor lighting.  Clear any walking paths of anything that might make someone trip, such as rocks or tools.  Regularly check to see if handrails are loose or broken. Make sure that both sides of any steps have handrails.  Any raised decks and porches should have guardrails on the edges.  Have any leaves, snow, or ice cleared regularly.  Use sand or salt on walking paths during winter.  Clean up any spills in your garage right away. This includes oil or grease spills. What can I do in the bathroom?  Use night lights.  Install grab bars by the toilet and in the tub and shower. Do not use towel bars as grab bars.  Use non-skid mats or decals in the tub or shower.  If you need to sit down in the shower, use a plastic, non-slip stool.  Keep the floor dry. Clean up any water that spills on the floor as soon as it happens.  Remove soap buildup in the tub or shower regularly.  Attach bath mats securely with double-sided non-slip rug tape.  Do not have throw rugs and other things on the floor that can make you trip. What can I do in the bedroom?  Use night lights.  Make sure that you have a  light by your bed that is easy to reach.  Do not use any sheets or blankets that are too big for your bed. They should not hang down onto the floor.  Have a firm chair that has side arms. You can use this for support while you get dressed.  Do not have throw rugs and other things on the floor that can make you trip. What can I do in the kitchen?  Clean up any spills right away.  Avoid walking on wet floors.  Keep items that you use a lot in easy-to-reach places.  If you need to reach something above you, use a strong step stool that has a grab bar.  Keep electrical cords out of the way.  Do not use floor polish or wax that makes floors slippery. If you must use wax, use non-skid floor wax.  Do not have throw rugs and other things on the floor that  can make you trip. What can I do with my stairs?  Do not leave any items on the stairs.  Make sure that there are handrails on both sides of the stairs and use them. Fix handrails that are broken or loose. Make sure that handrails are as long as the stairways.  Check any carpeting to make sure that it is firmly attached to the stairs. Fix any carpet that is loose or worn.  Avoid having throw rugs at the top or bottom of the stairs. If you do have throw rugs, attach them to the floor with carpet tape.  Make sure that you have a light switch at the top of the stairs and the bottom of the stairs. If you do not have them, ask someone to add them for you. What else can I do to help prevent falls?  Wear shoes that:  Do not have high heels.  Have rubber bottoms.  Are comfortable and fit you well.  Are closed at the toe. Do not wear sandals.  If you use a stepladder:  Make sure that it is fully opened. Do not climb a closed stepladder.  Make sure that both sides of the stepladder are locked into place.  Ask someone to hold it for you, if possible.  Clearly mark and make sure that you can see:  Any grab bars or handrails.  First and last steps.  Where the edge of each step is.  Use tools that help you move around (mobility aids) if they are needed. These include:  Canes.  Walkers.  Scooters.  Crutches.  Turn on the lights when you go into a dark area. Replace any light bulbs as soon as they burn out.  Set up your furniture so you have a clear path. Avoid moving your furniture around.  If any of your floors are uneven, fix them.  If there are any pets around you, be aware of where they are.  Review your medicines with your doctor. Some medicines can make you feel dizzy. This can increase your chance of falling. Ask your doctor what other things that you can do to help prevent falls. This information is not intended to replace advice given to you by your health care  provider. Make sure you discuss any questions you have with your health care provider. Document Released: 08/15/2009 Document Revised: 03/26/2016 Document Reviewed: 11/23/2014 Elsevier Interactive Patient Education  2017 [employer].

## 2020-11-05 NOTE — Progress Notes (Addendum)
Subjective:   [name] is a 60 y.o. female who presents for Medicare Annual (Subsequent) preventive examination.  Virtual Visit via Telephone Note  I connected with  [name] on 11/05/20 at  2:10 PM EST by telephone and verified that I am speaking with the correct person using two identifiers.  Location: Patient: home Provider: [place] Persons participating in the virtual visit: [place]   I discussed the limitations, risks, security and privacy concerns of performing an evaluation and management service by telephone and the availability of in person appointments. The patient expressed understanding and agreed to proceed.  Interactive audio and video telecommunications were attempted between this nurse and patient, however failed, due to patient having technical difficulties OR patient did not have access to video capability.  We continued and completed visit with audio only.  Some vital signs may be absent or patient reported.   Clemetine Marker, LPN    Review of Systems     Cardiac Risk Factors include: advanced age (>30mn, >>22women);dyslipidemia;hypertension;sedentary lifestyle     Objective:    Today's Vitals   11/05/20 1404  PainSc: 8    There is no height or weight on file to calculate BMI.  Advanced Directives 11/05/2020 04/17/2020 04/09/2020 08/01/2019 04/12/2019 04/12/2019 04/11/2019  Does Patient Have a Medical Advance Directive? _0  No No  Would patient like information on creating a medical advance directive? No - Patient declined No - Patient declined No - Patient declined No - Patient declined No - Guardian declined No - Patient declined No - Patient declined    Current Medications (verified) Outpatient Encounter Medications as of 11/05/2020  Medication Sig  . amitriptyline (ELAVIL) 10 MG tablet Take 1 tablet (10 mg total) by mouth at bedtime. For IBS  . amLODipine (NORVASC) 5 MG tablet TAKE ONE TABLET BY MOUTH ONCE DAILY  .  cholecalciferol (VITAMIN D) 1000 units tablet Take 1,000 Units by mouth daily.  .Marland Kitchendesvenlafaxine (PRISTIQ) 50 MG 24 hr tablet Take 1 tablet (50 mg total) by mouth daily.  .Marland Kitchengabapentin (NEURONTIN) 300 MG capsule Take 300 mg by mouth 2 (two) times daily.   .Marland Kitchenlosartan-hydrochlorothiazide (HYZAAR) 100-25 MG tablet TAKE ONE TABLET BY MOUTH ONCE DAILY  . meloxicam (MOBIC) 15 MG tablet   . metFORMIN (GLUCOPHAGE) 500 MG tablet Take 1 tablet (500 mg total) by mouth daily with breakfast.  . omeprazole (PRILOSEC) 20 MG capsule Take 1 capsule (20 mg total) by mouth daily.  . Oxycodone HCl 20 MG TABS Take 1 tablet by mouth 4 (four) times daily.  .Marland KitchentiZANidine (ZANAFLEX) 4 MG tablet Take 4 mg by mouth every 12 (twelve) hours.  . valACYclovir (VALTREX) 1000 MG tablet Take 1 tablet (1,000 mg total) by mouth 2 (two) times daily.  .Marland Kitchenatorvastatin (LIPITOR) 40 MG tablet Take 1 tablet (40 mg total) by mouth daily. (Patient not taking: Reported on 11/05/2020)  . NARCAN 4 MG/0.1ML LIQD nasal spray kit OPIOD EMERGENCY 1 SPRAY IN 1 NOSTRIL 1 TIME, MAY REPEAT IN OTHER NOSTRIL EVERY 2 - 3 MINS UNTIL RESPONSIVE /EMS ARRIVES  . QUEtiapine (SEROQUEL) 25 MG tablet Take 1 tablet (25 mg total) by mouth at bedtime. (Patient not taking: Reported on 11/05/2020)   No facility-administered encounter medications on file as of 11/05/2020.    Allergies (verified) Patient has no known allergies.   History: Past Medical History:  Diagnosis Date  . Arthritis    bilateral knees  . Contact dermatitis and  other eczema, due to unspecified cause   . COPD (chronic obstructive pulmonary disease) ([place])   . Depressive disorder   . Dysmetabolic syndrome X   . GERD (gastroesophageal reflux disease)   . Hyperlipidemia   . Hypertension   . IBS (irritable bowel syndrome)   . Insomnia   . Lumbago   . Nonspecific abnormal electrocardiogram (ECG) (EKG)   . Other ovarian failure(256.39)   . Postmenopausal atrophic vaginitis   . Symptomatic  menopausal or female climacteric states   . Unspecified vitamin D deficiency    Past Surgical History:  Procedure Laterality Date  . BACK SURGERY    . BREAST BIOPSY Left    neg  . BREAST LUMPECTOMY Left   . CARPAL TUNNEL RELEASE Left 08/15/2018   Procedure: CARPAL TUNNEL RELEASE;  Surgeon: Earnestine Leys, MD;  Location: [facility];  Service: Orthopedics;  Laterality: Left;  . CERVICAL DISCECTOMY     x 2; metal plate  . CHOLECYSTECTOMY    . COLONOSCOPY WITH PROPOFOL N/A 08/08/2018   Procedure: COLONOSCOPY WITH PROPOFOL;  Surgeon: Virgel Manifold, MD;  Location: [facility];  Service: Endoscopy;  Laterality: N/A;  . DILATATION & CURETTAGE/HYSTEROSCOPY WITH MYOSURE N/A 03/21/2015   Procedure: DILATATION & CURETTAGE/HYSTEROSCOPY;  Surgeon: Aletha Halim, MD;  Location: [facility];  Service: Gynecology;  Laterality: N/A;  . DILATION AND CURETTAGE OF UTERUS    . ENDOMETRIAL ABLATION    . ESOPHAGOGASTRODUODENOSCOPY (EGD) WITH PROPOFOL N/A 04/13/2018   Procedure: ESOPHAGOGASTRODUODENOSCOPY (EGD) WITH PROPOFOL;  Surgeon: Virgel Manifold, MD;  Location: [facility];  Service: Endoscopy;  Laterality: N/A;  . ESOPHAGOGASTRODUODENOSCOPY (EGD) WITH PROPOFOL N/A 08/08/2018   Procedure: ESOPHAGOGASTRODUODENOSCOPY (EGD) WITH PROPOFOL;  Surgeon: Virgel Manifold, MD;  Location: [facility];  Service: Endoscopy;  Laterality: N/A;  . JOINT REPLACEMENT    . SPINAL FUSION     lumbar x2  . TONSILLECTOMY    . TOTAL KNEE ARTHROPLASTY Left 04/12/2019   Procedure: TOTAL KNEE ARTHROPLASTY;  Surgeon: Lovell Sheehan, MD;  Location: [facility];  Service: Orthopedics;  Laterality: Left;  . TOTAL KNEE ARTHROPLASTY Right 04/17/2020   Procedure: TOTAL KNEE ARTHROPLASTY;  Surgeon: Lovell Sheehan, MD;  Location: [facility];  Service: Orthopedics;  Laterality: Right;  . TUBAL LIGATION     Family History  Problem Relation Age of Onset  . Heart disease Mother   . Thyroid cancer Mother   . Congestive Heart  Failure Mother   . Depression Daughter   . Asthma Daughter   . Breast cancer Maternal Grandmother    Social History   Socioeconomic History  . Marital status: Legally Separated    Spouse name: Not on file  . Number of children: 4  . Years of education: Not on file  . Highest education level: 12th grade  Occupational History  . Occupation: disabled    Comment: chronic back pain   Tobacco Use  . Smoking status: Current Every Day Smoker    Packs/day: 1.00    Years: 20.00    Pack years: 20.00    Types: Cigarettes    Start date: 07/26/2002    Last attempt to quit: 04/06/2019    Years since quitting: 1.5  . Smokeless tobacco: Never Used  . Tobacco comment: Quit Date 04/06/2019; started again Nov 21  Vaping Use  . Vaping Use: Never used  Substance and Sexual Activity  . Alcohol use: No    Alcohol/week: 0.0 standard drinks  . Drug use: No  . Sexual activity:  Yes    Partners: Male    Birth control/protection: Other-see comments    Comment: Ablation  Other Topics Concern  . Not on file  Social History Narrative  . Not on file   Social Determinants of Health   Financial Resource Strain: Medium Risk  . Difficulty of Paying Living Expenses: Somewhat hard  Food Insecurity: No Food Insecurity  . Worried About Charity fundraiser in the Last Year: Never true  . Ran Out of Food in the Last Year: Never true  Transportation Needs: No Transportation Needs  . Lack of Transportation (Medical): No  . Lack of Transportation (Non-Medical): No  Physical Activity: Inactive  . Days of Exercise per Week: 0 days  . Minutes of Exercise per Session: 0 min  Stress: No Stress Concern Present  . Feeling of Stress : Only a little  Social Connections: Moderately Isolated  . Frequency of Communication with Friends and Family: More than three times a week  . Frequency of Social Gatherings with Friends and Family: Three times a week  . Attends Religious Services: Never  . Active Member of Clubs or  Organizations: No  . Attends Archivist Meetings: Never  . Marital Status: Married    Tobacco Counseling Ready to quit: Yes Counseling given: Yes Comment: Quit Date 04/06/2019; started again Nov 21   Clinical Intake:  Pre-visit preparation completed: Yes  Pain : 0-10 Pain Score: 8  Pain Type: Chronic pain Pain Location: Back (and bilateral knee pain) Pain Descriptors / Indicators: Aching,Sore Pain Onset: More than a month ago Pain Frequency: Constant     Nutritional Risks: None Diabetes: No  How often do you need to have someone help you when you read instructions, pamphlets, or other written materials from your doctor or pharmacy?: 1 - Never    Interpreter Needed?: No  Information entered by :: Clemetine Marker LPN   Activities of Daily Living In your present state of health, do you have any difficulty performing the following activities: 11/05/2020 08/07/2020  Hearing? N N  Comment declines hearing aids -  Vision? N N  Difficulty concentrating or making decisions? N N  Walking or climbing stairs? Y Y  Dressing or bathing? N N  Doing errands, shopping? N N  Preparing Food and eating ? N -  Using the Toilet? N -  In the past six months, have you accidently leaked urine? N -  Do you have problems with loss of bowel control? N -  Managing your Medications? N -  Managing your Finances? N -  Housekeeping or managing your Housekeeping? N -  Some recent data might be hidden    Patient Care Team: Steele Sizer, MD as PCP - General (Family Medicine) Shanon Ace, MD as Consulting Physician (Anesthesiology) Virgel Manifold, MD as Consulting Physician (Gastroenterology) Earnestine Leys, MD as Consulting Physician (Orthopedic Surgery)  Indicate any recent Medical Services you may have received from other than [facility] providers in the past year (date may be approximate).     Assessment:   This is a routine wellness examination for  [place].  Hearing/Vision screen  Hearing Screening   _0  _1  _2  _3  _4  _5  _6  _7  _8   Right ear:           Left ear:           Comments: Pt denies hearing difficulty   Vision Screening Comments: Annual vision screenings at [place] issues and exercise activities discussed: Current Exercise Habits:  The patient does not participate in regular exercise at present, Exercise limited by: orthopedic condition(s)  Goals    . Quit Smoking     Recommend to continue efforts to reduce smoking habits until no longer smoking (Smoking Cessation literature attached to AVS).      Depression Screen PHQ 2/9 Scores 11/05/2020 08/07/2020 02/06/2020 12/01/2019 08/01/2019 07/13/2019 04/06/2019  PHQ - 2 Score 0 _0 0 0 0  PHQ- 9 Score - _1 - 4 0    Fall Risk Fall Risk  11/05/2020 08/07/2020 02/06/2020 08/01/2019 07/13/2019  Falls in the past year? 0 0 _2 Number falls in past yr: 0 0 0 1 0  Injury with Fall? 0 0 0 0 0  Risk for fall due to : Orthopedic patient - - History of fall(s) -  Risk for fall due to: Comment - - - - -  Follow up Falls prevention discussed - - Falls prevention discussed -    FALL RISK PREVENTION PERTAINING TO THE HOME:  Any stairs in or around the home? Yes  If so, are there any without handrails? No  Home free of loose throw rugs in walkways, pet beds, electrical cords, etc? Yes  Adequate lighting in your home to reduce risk of falls? Yes   ASSISTIVE DEVICES UTILIZED TO PREVENT FALLS:  Life alert? No  Use of a cane, walker or w/c? No  Grab bars in the bathroom? No  Shower chair or bench in shower? No  Elevated toilet seat or a handicapped toilet? No   TIMED UP AND GO:  Was the test performed? No . Telephonic visit.   Cognitive Function: Normal cognitive status assessed by direct observation by this Nurse Health Advisor. No abnormalities found.       6CIT Screen 07/28/2018  What Year? 0 points  What month? 0 points   What time? 0 points  Count back from 20 0 points  Months in reverse 0 points  Repeat phrase 0 points  Total Score 0    Immunizations Immunization History  Administered Date(s) Administered  . Influenza,inj,Quad PF,6+ Mos 08/08/2015, 08/26/2016, 07/26/2017, 07/28/2018, 08/01/2019, 08/07/2020  . Influenza-Unspecified 09/02/2013, 07/03/2014  . PFIZER SARS-COV-2 Vaccination 03/13/2020, 04/03/2020    TDAP status: Due, Education has been provided regarding the importance of this vaccine. Advised may receive this vaccine at local pharmacy or Health Dept. Aware to provide a copy of the vaccination record if obtained from local pharmacy or Health Dept. Verbalized acceptance and understanding.  Flu Vaccine status: Up to date  Pneumococcal vaccine status: due age 19  Covid-19 vaccine status: Completed vaccines  Qualifies for Shingles Vaccine? Yes   Zostavax completed No   Shingrix Completed?: No.    Education has been provided regarding the importance of this vaccine. Patient has been advised to call insurance company to determine out of pocket expense if they have not yet received this vaccine. Advised may also receive vaccine at local pharmacy or Health Dept. Verbalized acceptance and understanding.  Screening Tests Health Maintenance  Topic Date Due  . TETANUS/TDAP  04/17/2020  . COVID-19 Vaccine (3 - Booster for Pfizer series) 10/03/2020  . MAMMOGRAM  12/18/2020  . PAP SMEAR-Modifier  08/03/2021  . COLONOSCOPY (Pts 45-44yr Insurance coverage will need to be confirmed)  08/08/2021  . INFLUENZA VACCINE  Completed  . Hepatitis C Screening  Completed  . HIV Screening  Completed    Health Maintenance  Health Maintenance Due  Topic Date Due  .  TETANUS/TDAP  04/17/2020  . COVID-19 Vaccine (3 - Booster for Pfizer series) 10/03/2020    Colorectal cancer screening: Type of screening: Colonoscopy. Completed 08/08/18. Repeat every 3 years  Mammogram status: Completed 12/19/19. Repeat  every year  Bone density screening: due at age 20  Lung Cancer Screening: (Low Dose CT Chest recommended if Age 7-80 years, 30 pack-year currently smoking OR have quit w/in 15years.) does not qualify.   Additional Screening:  Hepatitis C Screening: does qualify; Completed 11/23/18  Vision Screening: Recommended annual ophthalmology exams for early detection of glaucoma and other disorders of the eye. Is the patient up to date with their annual eye exam?  Yes  Who is the provider or what is the name of the office in which the patient attends annual eye exams? [facility]  Dental Screening: Recommended annual dental exams for proper oral hygiene  Community Resource Referral / Chronic Care Management: CRR required this visit?  No   CCM required this visit?  No      Plan:     I have personally reviewed and noted the following in the patient's chart:   . Medical and social history . Use of alcohol, tobacco or illicit drugs  . Current medications and supplements . Functional ability and status . Nutritional status . Physical activity . Advanced directives . List of other physicians . Hospitalizations, surgeries, and ER visits in previous 12 months . Vitals . Screenings to include cognitive, depression, and falls . Referrals and appointments  In addition, I have reviewed and discussed with patient certain preventive protocols, quality metrics, and best practice recommendations. A written personalized care plan for preventive services as well as general preventive health recommendations were provided to patient.     Clemetine Marker, LPN   12/03/1153   Nurse Notes: pt c/o medium egg sized cyst that is moveable on upper left breast 2 finger widths from axilla. Pt states Dr. Ancil Boozer has examined previously but reports cyst growing over the past few months. Pt advised to schedule next available appt to examine due to hx of lumpectomy on left breast and needs to be seen prior to  next mammogram.   Pt also c/o left foot swelling and requests referral to cardiology if needed due to fam hx of mother with CHF.   Patient has started smoking again and requested rx for smoking cessation aid. Previously tried patches and gum without success and pt is aware chantix is off market. Pt to discuss with Dr. Ancil Boozer.   Patient states needs refill for atorvastatin and seroquel; advised to contact pharmacy to request electronically.

## 2020-11-08 DIAGNOSIS — Z23 Encounter for immunization: Secondary | ICD-10-CM | POA: Diagnosis not present

## 2020-11-14 ENCOUNTER — Telehealth: Payer: Self-pay

## 2020-11-14 NOTE — Telephone Encounter (Signed)
Spoke with patient- inquiring if we had a copy of her disability paperwork. Referred her to the physician's office that put her on disability and filled out the forms. Pt gave verbal understanding.

## 2020-11-14 NOTE — Telephone Encounter (Signed)
Copied from [place] [id number]. Topic: General - Other >> Nov 14, 2020  4:20 PM Leward Quan A wrote: Reason for CRM: Patient would like a call back from Dr Ancil Boozer nurse has questions pertaining to her disability. Can be reached at Ph# [phone]

## 2020-11-14 NOTE — Telephone Encounter (Signed)
Left vm for call back

## 2020-11-19 ENCOUNTER — Other Ambulatory Visit: Payer: Self-pay | Admitting: Family Medicine

## 2020-11-19 DIAGNOSIS — F339 Major depressive disorder, recurrent, unspecified: Secondary | ICD-10-CM

## 2020-11-21 ENCOUNTER — Ambulatory Visit: Payer: Medicare Other | Admitting: Family Medicine

## 2020-11-22 ENCOUNTER — Ambulatory Visit: Payer: Medicare Other | Admitting: Family Medicine

## 2020-11-26 NOTE — Progress Notes (Signed)
Name: [name]   MRN: [id number]    DOB: 11-29-1960   Date:11/27/2020       Progress Note  Subjective  Chief Complaint  Breast check   HPI  Mass on anterior chest: she states the mass that has been on her left upper chest is growing and is tender at times  Breast changes: she states she has noticed a weird feeling under left breast, she asked me to exam it. Last mammogram one year ago.   Patient Active Problem List   Diagnosis Date Noted  . History of total knee arthroplasty, right 04/17/2020  . S/P TKR (total knee replacement) using cement, left 04/12/2019  . Overweight (BMI 25.0-29.9) 04/06/2019  . Peptic ulcer   . Benign neoplasm of ascending colon   . Diverticulosis of large intestine without diverticulitis   . Simple chronic bronchitis ([place]) 08/03/2018  . Pancreatic lesion 04/21/2018  . Abdominal aortic atherosclerosis ([place]) 04/21/2018  . Acute peptic ulcer of stomach   . Gastric polyp   . Abdominal pain, epigastric   . Trigger thumb of right hand 02/25/2018  . Carpal tunnel syndrome on both sides 10/21/2017  . CRP elevated 08/09/2017  . Elevated C-reactive protein 07/28/2017  . Osteoarthritis of both knees 02/19/2016  . COPD, mild ([place]) 05/08/2015  . Vitamin D deficiency 05/07/2015  . Metabolic syndrome 56/38/9373  . Eczema 05/07/2015  . Chronic insomnia 05/07/2015  . Hypertension, benign 05/07/2015  . Dyslipidemia 05/07/2015  . Hyperglycemia 05/07/2015  . Chronic low back pain 05/07/2015  . IBS (irritable bowel syndrome) 05/07/2015  . Gastroesophageal reflux disease without esophagitis 05/07/2015  . Major depression, chronic ([place]) 05/07/2015  . Menopausal syndrome (hot flashes) 05/07/2015  . History of postmenopausal bleeding 01/14/2015    Past Surgical History:  Procedure Laterality Date  . BACK SURGERY    . BREAST BIOPSY Left    neg  . BREAST LUMPECTOMY Left   . CARPAL TUNNEL RELEASE Left 08/15/2018   Procedure: CARPAL TUNNEL RELEASE;  Surgeon:  Earnestine Leys, MD;  Location: [facility];  Service: Orthopedics;  Laterality: Left;  . CERVICAL DISCECTOMY     x 2; metal plate  . CHOLECYSTECTOMY    . COLONOSCOPY WITH PROPOFOL N/A 08/08/2018   Procedure: COLONOSCOPY WITH PROPOFOL;  Surgeon: Virgel Manifold, MD;  Location: [facility];  Service: Endoscopy;  Laterality: N/A;  . DILATATION & CURETTAGE/HYSTEROSCOPY WITH MYOSURE N/A 03/21/2015   Procedure: DILATATION & CURETTAGE/HYSTEROSCOPY;  Surgeon: Aletha Halim, MD;  Location: [facility];  Service: Gynecology;  Laterality: N/A;  . DILATION AND CURETTAGE OF UTERUS    . ENDOMETRIAL ABLATION    . ESOPHAGOGASTRODUODENOSCOPY (EGD) WITH PROPOFOL N/A 04/13/2018   Procedure: ESOPHAGOGASTRODUODENOSCOPY (EGD) WITH PROPOFOL;  Surgeon: Virgel Manifold, MD;  Location: [facility];  Service: Endoscopy;  Laterality: N/A;  . ESOPHAGOGASTRODUODENOSCOPY (EGD) WITH PROPOFOL N/A 08/08/2018   Procedure: ESOPHAGOGASTRODUODENOSCOPY (EGD) WITH PROPOFOL;  Surgeon: Virgel Manifold, MD;  Location: [facility];  Service: Endoscopy;  Laterality: N/A;  . JOINT REPLACEMENT    . SPINAL FUSION     lumbar x2  . TONSILLECTOMY    . TOTAL KNEE ARTHROPLASTY Left 04/12/2019   Procedure: TOTAL KNEE ARTHROPLASTY;  Surgeon: Lovell Sheehan, MD;  Location: [facility];  Service: Orthopedics;  Laterality: Left;  . TOTAL KNEE ARTHROPLASTY Right 04/17/2020   Procedure: TOTAL KNEE ARTHROPLASTY;  Surgeon: Lovell Sheehan, MD;  Location: [facility];  Service: Orthopedics;  Laterality: Right;  . TUBAL LIGATION      Family  History  Problem Relation Age of Onset  . Heart disease Mother   . Thyroid cancer Mother   . Congestive Heart Failure Mother   . Depression Daughter   . Asthma Daughter   . Breast cancer Maternal Grandmother     Social History   Tobacco Use  . Smoking status: Current Every Day Smoker    Packs/day: 1.00    Years: 20.00    Pack years: 20.00    Types: Cigarettes    Start date: 07/26/2002     Last attempt to quit: 04/06/2019    Years since quitting: 1.6  . Smokeless tobacco: Never Used  . Tobacco comment: Quit Date 04/06/2019; started again Nov 21  Substance Use Topics  . Alcohol use: No    Alcohol/week: 0.0 standard drinks     Current Outpatient Medications:  .  amitriptyline (ELAVIL) 10 MG tablet, Take 1 tablet (10 mg total) by mouth at bedtime. For IBS, Disp: 90 tablet, Rfl: 1 .  amLODipine (NORVASC) 5 MG tablet, TAKE ONE TABLET BY MOUTH ONCE DAILY, Disp: 90 tablet, Rfl: 1 .  atorvastatin (LIPITOR) 40 MG tablet, Take 1 tablet (40 mg total) by mouth daily., Disp: 90 tablet, Rfl: 1 .  cholecalciferol (VITAMIN D) 1000 units tablet, Take 1,000 Units by mouth daily., Disp: , Rfl:  .  desvenlafaxine (PRISTIQ) 50 MG 24 hr tablet, TAKE ONE TABLET BY MOUTH ONCE DAILY, Disp: 90 tablet, Rfl: 1 .  gabapentin (NEURONTIN) 300 MG capsule, Take 300 mg by mouth 2 (two) times daily. , Disp: , Rfl:  .  losartan-hydrochlorothiazide (HYZAAR) 100-25 MG tablet, TAKE ONE TABLET BY MOUTH ONCE DAILY, Disp: 90 tablet, Rfl: 0 .  meloxicam (MOBIC) 15 MG tablet, , Disp: , Rfl:  .  metFORMIN (GLUCOPHAGE) 500 MG tablet, Take 1 tablet (500 mg total) by mouth daily with breakfast., Disp: 90 tablet, Rfl: 1 .  NARCAN 4 MG/0.1ML LIQD nasal spray kit, OPIOD EMERGENCY 1 SPRAY IN 1 NOSTRIL 1 TIME, MAY REPEAT IN OTHER NOSTRIL EVERY 2 - 3 MINS UNTIL RESPONSIVE /EMS ARRIVES, Disp: , Rfl:  .  omeprazole (PRILOSEC) 20 MG capsule, TAKE ONE CAPSULE BY MOUTH ONCE DAILY, Disp: 90 capsule, Rfl: 1 .  Oxycodone HCl 20 MG TABS, Take 1 tablet by mouth 4 (four) times daily., Disp: , Rfl:  .  QUEtiapine (SEROQUEL) 25 MG tablet, Take 1 tablet (25 mg total) by mouth at bedtime., Disp: 90 tablet, Rfl: 1 .  tiZANidine (ZANAFLEX) 4 MG tablet, Take 4 mg by mouth every 12 (twelve) hours., Disp: , Rfl:  .  valACYclovir (VALTREX) 1000 MG tablet, Take 1 tablet (1,000 mg total) by mouth 2 (two) times daily., Disp: 6 tablet, Rfl: 1  No Known  Allergies  I personally reviewed active problem list, medication list, allergies, family history, social history, health maintenance with the patient/caregiver today.   ROS  Ten systems reviewed and is negative except as mentioned in HPI  Chronic pain, intermittently lower leg edema  Objective  Vitals:   11/27/20 1137  BP: 122/80  Pulse: 86  Resp: 16  Temp: 98.4 F (36.9 C)  TempSrc: Oral  SpO2: 100%  Weight: 208 lb 11.2 oz (94.7 kg)  Height: 5' 9" (1.753 m)    Body mass index is 30.82 kg/m.  Physical Exam  Constitutional: Patient appears well-developed and well-nourished. Obese  No distress.  HEENT: head atraumatic, normocephalic, pupils equal and reactive to light, neck supple Cardiovascular: Normal rate, regular rhythm and normal heart sounds.  No  murmur heard. No BLE edema. Breast : normal exam, no masses, change in skin or nipple discharge Pulmonary/Chest: Effort normal and breath sounds normal. No respiratory distress. Chest wall has a soft mass, protruding on left upper chest wall, skin overlying is normal No redness or increase in warmth  Abdominal: Soft.  There is no tenderness. Psychiatric: Patient has a normal mood and affect. behavior is normal. Judgment and thought content normal.  PHQ2/9: Depression screen [facility] 2/9 11/27/2020 11/05/2020 08/07/2020 02/06/2020 12/01/2019  Decreased Interest 1 0 0 0 2  Down, Depressed, Hopeless 1 0 1 1 0  PHQ - 2 Score 2 0 _0 Altered sleeping 1 - 0 1 3  Tired, decreased energy 1 - 0 1 1  Change in appetite 0 - 0 0 3  Feeling bad or failure about yourself  0 - 1 0 2  Trouble concentrating 0 - 0 0 0  Moving slowly or fidgety/restless 0 - 0 0 0  Suicidal thoughts 0 - 0 0 0  PHQ-9 Score 4 - _1 Difficult doing work/chores - - Not difficult at all Not difficult at all Not difficult at all  Some recent data might be hidden    phq 9 is positive, under treatment    Fall Risk: Fall Risk  11/27/2020 11/05/2020 08/07/2020  02/06/2020 08/01/2019  Falls in the past year? 0 0 0 1 1  Number falls in past yr: 0 0 0 0 1  Injury with Fall? 0 0 0 0 0  Risk for fall due to : - Orthopedic patient - - History of fall(s)  Risk for fall due to: Comment - - - - -  Follow up - Falls prevention discussed - - Falls prevention discussed     Assessment & Plan  1. Lipoma of anterior chest wall  - Ambulatory referral to General Surgery  2. Encounter for screening mammogram for breast cancer  - MM 3D SCREEN BREAST BILATERAL; Future

## 2020-11-27 ENCOUNTER — Other Ambulatory Visit: Payer: Self-pay

## 2020-11-27 ENCOUNTER — Ambulatory Visit (INDEPENDENT_AMBULATORY_CARE_PROVIDER_SITE_OTHER): Payer: Medicare Other | Admitting: Family Medicine

## 2020-11-27 ENCOUNTER — Encounter: Payer: Self-pay | Admitting: Family Medicine

## 2020-11-27 VITALS — BP 122/80 | HR 86 | Temp 98.4°F | Resp 16 | Ht 69.0 in | Wt 208.7 lb

## 2020-11-27 DIAGNOSIS — Z1231 Encounter for screening mammogram for malignant neoplasm of breast: Secondary | ICD-10-CM

## 2020-11-27 DIAGNOSIS — D171 Benign lipomatous neoplasm of skin and subcutaneous tissue of trunk: Secondary | ICD-10-CM | POA: Diagnosis not present

## 2020-11-29 ENCOUNTER — Other Ambulatory Visit: Payer: Self-pay | Admitting: Family Medicine

## 2020-11-29 DIAGNOSIS — F5104 Psychophysiologic insomnia: Secondary | ICD-10-CM

## 2020-11-29 DIAGNOSIS — F339 Major depressive disorder, recurrent, unspecified: Secondary | ICD-10-CM

## 2020-11-29 NOTE — Telephone Encounter (Signed)
Requested medication (s) are due for refill today: yes  Requested medication (s) are on the active medication list: yes  Last refill:  08/07/20 #90 with 1 refill  Future visit scheduled: yes  Notes to clinic:  Please review for refill. Refill not delegated per protocol.    Requested Prescriptions  Pending Prescriptions Disp Refills   QUEtiapine (SEROQUEL) 25 MG tablet [Pharmacy Med Name: quetiapine 25 mg tablet] 90 tablet 1    Sig: TAKE ONE TABLET BY MOUTH DAILY AT BEDTIME      Not Delegated - Psychiatry:  Antipsychotics - Second Generation (Atypical) - quetiapine Failed - 11/29/2020 11:28 AM      Failed - This refill cannot be delegated      Passed - ALT in normal range and within 180 days    ALT  Date Value Ref Range Status  08/07/2020 16 6 - 29 U/L Final          Passed - AST in normal range and within 180 days    AST  Date Value Ref Range Status  08/07/2020 17 10 - 35 U/L Final          Passed - Completed PHQ-2 or PHQ-9 in the last 360 days      Passed - Last BP in normal range    BP Readings from Last 1 Encounters:  11/27/20 122/80          Passed - Valid encounter within last 6 months    Recent Outpatient Visits           2 days ago Lipoma of anterior chest wall   [facility] Steele Sizer, MD   3 months ago COPD, mild [facility])   [facility] Steele Sizer, MD   9 months ago Major depression, recurrent, chronic [facility])   [facility] Steele Sizer, MD   12 months ago Fever blister   [facility] Steele Sizer, MD   1 year ago Anemia, unspecified type   [facility] Steele Sizer, MD       Future Appointments             In 2 months Ancil Boozer, Drue Stager, MD [facility] [place]   In 11 months  [facility]

## 2020-12-05 ENCOUNTER — Ambulatory Visit: Payer: Medicare Other | Admitting: Surgery

## 2020-12-12 ENCOUNTER — Ambulatory Visit: Payer: Medicare Other | Admitting: Surgery

## 2020-12-12 ENCOUNTER — Other Ambulatory Visit: Payer: Self-pay

## 2020-12-12 ENCOUNTER — Ambulatory Visit (INDEPENDENT_AMBULATORY_CARE_PROVIDER_SITE_OTHER): Payer: Medicare Other | Admitting: Surgery

## 2020-12-12 VITALS — BP 124/83 | HR 112 | Temp 98.4°F | Ht 68.0 in | Wt 212.6 lb

## 2020-12-12 DIAGNOSIS — Z1231 Encounter for screening mammogram for malignant neoplasm of breast: Secondary | ICD-10-CM | POA: Diagnosis not present

## 2020-12-12 DIAGNOSIS — M7989 Other specified soft tissue disorders: Secondary | ICD-10-CM | POA: Insufficient documentation

## 2020-12-12 NOTE — Progress Notes (Signed)
Patient ID: [name], female   DOB: 20-Jan-1961, 60 y.o.   MRN: [id number]  Chief Complaint: Left breast mass  History of Present Illness [name] is a 60 y.o. female with a 3-year history of a progressively enlarging upper left breast mass consistent with a subcutaneous lipoma.  She has maintained her annual mammography and is due for her screening very soon.  She denies any pain or tenderness.  The lump is actually becoming prominent enough to be identified with a low blouse neck line.     Past Medical History Past Medical History:  Diagnosis Date  . Arthritis    bilateral knees  . Contact dermatitis and other eczema, due to unspecified cause   . COPD (chronic obstructive pulmonary disease) ([place])   . Depressive disorder   . Dysmetabolic syndrome X   . GERD (gastroesophageal reflux disease)   . Hyperlipidemia   . Hypertension   . IBS (irritable bowel syndrome)   . Insomnia   . Lumbago   . Nonspecific abnormal electrocardiogram (ECG) (EKG)   . Other ovarian failure(256.39)   . Postmenopausal atrophic vaginitis   . Symptomatic menopausal or female climacteric states   . Unspecified vitamin D deficiency       Past Surgical History:  Procedure Laterality Date  . BACK SURGERY    . BREAST BIOPSY Left    neg  . BREAST LUMPECTOMY Left   . CARPAL TUNNEL RELEASE Left 08/15/2018   Procedure: CARPAL TUNNEL RELEASE;  Surgeon: Earnestine Leys, MD;  Location: [facility];  Service: Orthopedics;  Laterality: Left;  . CERVICAL DISCECTOMY     x 2; metal plate  . CHOLECYSTECTOMY    . COLONOSCOPY WITH PROPOFOL N/A 08/08/2018   Procedure: COLONOSCOPY WITH PROPOFOL;  Surgeon: Virgel Manifold, MD;  Location: [facility];  Service: Endoscopy;  Laterality: N/A;  . DILATATION & CURETTAGE/HYSTEROSCOPY WITH MYOSURE N/A 03/21/2015   Procedure: DILATATION & CURETTAGE/HYSTEROSCOPY;  Surgeon: Aletha Halim, MD;  Location: [facility];  Service: Gynecology;  Laterality: N/A;  . DILATION AND  CURETTAGE OF UTERUS    . ENDOMETRIAL ABLATION    . ESOPHAGOGASTRODUODENOSCOPY (EGD) WITH PROPOFOL N/A 04/13/2018   Procedure: ESOPHAGOGASTRODUODENOSCOPY (EGD) WITH PROPOFOL;  Surgeon: Virgel Manifold, MD;  Location: [facility];  Service: Endoscopy;  Laterality: N/A;  . ESOPHAGOGASTRODUODENOSCOPY (EGD) WITH PROPOFOL N/A 08/08/2018   Procedure: ESOPHAGOGASTRODUODENOSCOPY (EGD) WITH PROPOFOL;  Surgeon: Virgel Manifold, MD;  Location: [facility];  Service: Endoscopy;  Laterality: N/A;  . JOINT REPLACEMENT    . SPINAL FUSION     lumbar x2  . TONSILLECTOMY    . TOTAL KNEE ARTHROPLASTY Left 04/12/2019   Procedure: TOTAL KNEE ARTHROPLASTY;  Surgeon: Lovell Sheehan, MD;  Location: [facility];  Service: Orthopedics;  Laterality: Left;  . TOTAL KNEE ARTHROPLASTY Right 04/17/2020   Procedure: TOTAL KNEE ARTHROPLASTY;  Surgeon: Lovell Sheehan, MD;  Location: [facility];  Service: Orthopedics;  Laterality: Right;  . TUBAL LIGATION      No Known Allergies  Current Outpatient Medications  Medication Sig Dispense Refill  . amitriptyline (ELAVIL) 10 MG tablet Take 1 tablet (10 mg total) by mouth at bedtime. For IBS 90 tablet 1  . amLODipine (NORVASC) 5 MG tablet TAKE ONE TABLET BY MOUTH ONCE DAILY 90 tablet 1  . atorvastatin (LIPITOR) 40 MG tablet Take 1 tablet (40 mg total) by mouth daily. 90 tablet 1  . cholecalciferol (VITAMIN D) 1000 units tablet Take 1,000 Units by mouth daily.    Marland Kitchen  desvenlafaxine (PRISTIQ) 50 MG 24 hr tablet TAKE ONE TABLET BY MOUTH ONCE DAILY 90 tablet 1  . gabapentin (NEURONTIN) 300 MG capsule Take 300 mg by mouth 2 (two) times daily.     Marland Kitchen losartan-hydrochlorothiazide (HYZAAR) 100-25 MG tablet TAKE ONE TABLET BY MOUTH ONCE DAILY 90 tablet 0  . meloxicam (MOBIC) 15 MG tablet     . metFORMIN (GLUCOPHAGE) 500 MG tablet Take 1 tablet (500 mg total) by mouth daily with breakfast. 90 tablet 1  . NARCAN 4 MG/0.1ML LIQD nasal spray kit OPIOD EMERGENCY 1 SPRAY IN 1 NOSTRIL 1  TIME, MAY REPEAT IN OTHER NOSTRIL EVERY 2 - 3 MINS UNTIL RESPONSIVE /EMS ARRIVES    . omeprazole (PRILOSEC) 20 MG capsule TAKE ONE CAPSULE BY MOUTH ONCE DAILY 90 capsule 1  . Oxycodone HCl 20 MG TABS Take 1 tablet by mouth 4 (four) times daily.    . QUEtiapine (SEROQUEL) 25 MG tablet TAKE ONE TABLET BY MOUTH DAILY AT BEDTIME 90 tablet 1  . tiZANidine (ZANAFLEX) 4 MG tablet Take 4 mg by mouth every 12 (twelve) hours.    . valACYclovir (VALTREX) 1000 MG tablet Take 1 tablet (1,000 mg total) by mouth 2 (two) times daily. 6 tablet 1   No current facility-administered medications for this visit.    Family History Family History  Problem Relation Age of Onset  . Heart disease Mother   . Thyroid cancer Mother   . Congestive Heart Failure Mother   . Depression Daughter   . Asthma Daughter   . Breast cancer Maternal Grandmother       Social History Social History   Tobacco Use  . Smoking status: Current Every Day Smoker    Packs/day: 1.00    Years: 20.00    Pack years: 20.00    Types: Cigarettes    Start date: 07/26/2002    Last attempt to quit: 04/06/2019    Years since quitting: 1.6  . Smokeless tobacco: Never Used  . Tobacco comment: Quit Date 04/06/2019; started again Nov 21  Vaping Use  . Vaping Use: Never used  Substance Use Topics  . Alcohol use: No    Alcohol/week: 0.0 standard drinks  . Drug use: No        Review of Systems  Constitutional: Negative.   HENT: Negative.   Eyes: Negative.   Respiratory: Negative.   Cardiovascular: Negative.   Gastrointestinal: Positive for abdominal pain.  Genitourinary: Negative.   Skin: Negative.   Neurological: Positive for tingling and headaches.  Psychiatric/Behavioral: Positive for depression.      Physical Exam Blood pressure 124/83, pulse (!) 112, temperature 98.4 F (36.9 C), temperature source Oral, height $RemoveBefo'5\' 8"'lwANrUssUIl$  (1.727 m), weight 212 lb 9.6 oz (96.4 kg), SpO2 93 %. Last Weight  Most recent update: 12/12/2020  1:37 PM    Weight  96.4 kg (212 lb 9.6 oz)            CONSTITUTIONAL: Well developed, and nourished, appropriately responsive and aware without distress.   EYES: Sclera non-icteric.   EARS, NOSE, MOUTH AND THROAT: Mask worn.   Hearing is intact to voice.  NECK: Trachea is midline, and there is no jugular venous distension.  LYMPH NODES:  Lymph nodes in the neck are not enlarged. RESPIRATORY:  Lungs are clear, and breath sounds are equal bilaterally. Normal respiratory effort without pathologic use of accessory muscles. CARDIOVASCULAR: Heart is regular in rate and rhythm. GI: The abdomen is soft, nontender, and nondistended. There were no palpable  masses. I did not appreciate hepatosplenomegaly. There were normal bowel sounds. GU: Her left breast is notable for a subcutaneous soft fleshy mass, multilobular and readily appreciated by vision.  Estimated size is approximately 7 cm x 3 cm x 1.5 cm MUSCULOSKELETAL:  Symmetrical muscle tone appreciated in all four extremities.    SKIN: Skin turgor is normal. No pathologic skin lesions appreciated.  NEUROLOGIC:  Motor and sensation appear grossly normal.  Cranial nerves are grossly without defect. PSYCH:  Alert and oriented to person, place and time. Affect is appropriate for situation.  Data Reviewed I have personally reviewed what is currently available of the patient's imaging, recent labs and medical records.   Labs:  CBC Latest Ref Rng & Units 08/07/2020 04/19/2020 04/18/2020  WBC 3.8 - 10.8 Thousand/uL 6.7 11.6(H) 13.9(H)  Hemoglobin 11.7 - 15.5 g/dL 12.4 9.3(L) 11.1(L)  Hematocrit 35.0 - 45.0 % 37.5 28.8(L) 32.4(L)  Platelets 140 - 400 Thousand/uL 301 260 290   CMP Latest Ref Rng & Units 08/07/2020 04/18/2020 04/09/2020  Glucose 65 - 99 mg/dL 93 197(H) 92  BUN 7 - 25 mg/dL $Remove'12 11 18  'rSDlXuF$ Creatinine 0.50 - 1.05 mg/dL 0.59 0.77 0.73  Sodium 135 - 146 mmol/L 145 139 139  Potassium 3.5 - 5.3 mmol/L 4.9 3.3(L) 3.4(L)  Chloride 98 - 110 mmol/L 104 100 103  CO2  20 - 32 mmol/L $RemoveB'28 26 27  'DsSVYclm$ Calcium 8.6 - 10.4 mg/dL 10.2 9.6 9.5  Total Protein 6.1 - 8.1 g/dL 6.7 - -  Total Bilirubin 0.2 - 1.2 mg/dL 0.4 - -  Alkaline Phos 38 - 126 U/L - - -  AST 10 - 35 U/L 17 - -  ALT 6 - 29 U/L 16 - -      Imaging: Radiology review:   CLINICAL DATA:  Screening.  EXAM: DIGITAL SCREENING BILATERAL MAMMOGRAM WITH TOMO AND CAD  COMPARISON:  Previous exam(s).  ACR Breast Density Category c: The breast tissue is heterogeneously dense, which may obscure small masses.  FINDINGS: There are no findings suspicious for malignancy. Images were processed with CAD.  IMPRESSION: No mammographic evidence of malignancy. A result letter of this screening mammogram will be mailed directly to the patient.  RECOMMENDATION: Screening mammogram in one year. (Code:SM-B-01Y)  BI-RADS CATEGORY  1: Negative.   Electronically Signed   By: Ammie Ferrier M.D.   On: 12/20/2019 09:37 Within last 24 hrs: No results found.  Assessment    Left upper breast mass, consistent with lipomatous lesion.  Patient Active Problem List   Diagnosis Date Noted  . History of total knee arthroplasty, right 04/17/2020  . S/P TKR (total knee replacement) using cement, left 04/12/2019  . Overweight (BMI 25.0-29.9) 04/06/2019  . Peptic ulcer   . Benign neoplasm of ascending colon   . Diverticulosis of large intestine without diverticulitis   . Simple chronic bronchitis ([place]) 08/03/2018  . Pancreatic lesion 04/21/2018  . Abdominal aortic atherosclerosis ([place]) 04/21/2018  . Acute peptic ulcer of stomach   . Gastric polyp   . Abdominal pain, epigastric   . Trigger thumb of right hand 02/25/2018  . Carpal tunnel syndrome on both sides 10/21/2017  . CRP elevated 08/09/2017  . Elevated C-reactive protein 07/28/2017  . Osteoarthritis of both knees 02/19/2016  . COPD, mild ([place]) 05/08/2015  . Vitamin D deficiency 05/07/2015  . Metabolic syndrome 44/62/8638  . Eczema  05/07/2015  . Chronic insomnia 05/07/2015  . Hypertension, benign 05/07/2015  . Dyslipidemia 05/07/2015  . Hyperglycemia 05/07/2015  .  Chronic low back pain 05/07/2015  . IBS (irritable bowel syndrome) 05/07/2015  . Gastroesophageal reflux disease without esophagitis 05/07/2015  . Major depression, chronic ([place]) 05/07/2015  . Menopausal syndrome (hot flashes) 05/07/2015  . History of postmenopausal bleeding 01/14/2015    Plan    We will follow-up after screening mammography.    If negative will proceed with excision of left upper breast mass, anticipating/expecting multilobulated lipomatous lesion. Risks of anesthesia, bleeding, infection, recurrence, seroma or hematoma discussed in detail with patient.  I believe she understands and desires to proceed.  Questions answered.  No guarantees were ever expressed or implied.  Face-to-face time spent with the patient and accompanying care providers(if present) was 25 minutes, with more than 50% of the time spent counseling, educating, and coordinating care of the patient.      Ronny Bacon M.D., FACS 12/12/2020, 2:15 PM

## 2020-12-12 NOTE — H&P (View-Only) (Signed)
Patient ID: [name], female   DOB: October 26, 1961, 60 y.o.   MRN: [id number]  Chief Complaint: Left breast mass  History of Present Illness [name] is a 60 y.o. female with a 3-year history of a progressively enlarging upper left breast mass consistent with a subcutaneous lipoma.  She has maintained her annual mammography and is due for her screening very soon.  She denies any pain or tenderness.  The lump is actually becoming prominent enough to be identified with a low blouse neck line.     Past Medical History Past Medical History:  Diagnosis Date  . Arthritis    bilateral knees  . Contact dermatitis and other eczema, due to unspecified cause   . COPD (chronic obstructive pulmonary disease) ([place])   . Depressive disorder   . Dysmetabolic syndrome X   . GERD (gastroesophageal reflux disease)   . Hyperlipidemia   . Hypertension   . IBS (irritable bowel syndrome)   . Insomnia   . Lumbago   . Nonspecific abnormal electrocardiogram (ECG) (EKG)   . Other ovarian failure(256.39)   . Postmenopausal atrophic vaginitis   . Symptomatic menopausal or female climacteric states   . Unspecified vitamin D deficiency       Past Surgical History:  Procedure Laterality Date  . BACK SURGERY    . BREAST BIOPSY Left    neg  . BREAST LUMPECTOMY Left   . CARPAL TUNNEL RELEASE Left 08/15/2018   Procedure: CARPAL TUNNEL RELEASE;  Surgeon: Earnestine Leys, MD;  Location: [facility];  Service: Orthopedics;  Laterality: Left;  . CERVICAL DISCECTOMY     x 2; metal plate  . CHOLECYSTECTOMY    . COLONOSCOPY WITH PROPOFOL N/A 08/08/2018   Procedure: COLONOSCOPY WITH PROPOFOL;  Surgeon: Virgel Manifold, MD;  Location: [facility];  Service: Endoscopy;  Laterality: N/A;  . DILATATION & CURETTAGE/HYSTEROSCOPY WITH MYOSURE N/A 03/21/2015   Procedure: DILATATION & CURETTAGE/HYSTEROSCOPY;  Surgeon: Aletha Halim, MD;  Location: [facility];  Service: Gynecology;  Laterality: N/A;  . DILATION AND  CURETTAGE OF UTERUS    . ENDOMETRIAL ABLATION    . ESOPHAGOGASTRODUODENOSCOPY (EGD) WITH PROPOFOL N/A 04/13/2018   Procedure: ESOPHAGOGASTRODUODENOSCOPY (EGD) WITH PROPOFOL;  Surgeon: Virgel Manifold, MD;  Location: [facility];  Service: Endoscopy;  Laterality: N/A;  . ESOPHAGOGASTRODUODENOSCOPY (EGD) WITH PROPOFOL N/A 08/08/2018   Procedure: ESOPHAGOGASTRODUODENOSCOPY (EGD) WITH PROPOFOL;  Surgeon: Virgel Manifold, MD;  Location: [facility];  Service: Endoscopy;  Laterality: N/A;  . JOINT REPLACEMENT    . SPINAL FUSION     lumbar x2  . TONSILLECTOMY    . TOTAL KNEE ARTHROPLASTY Left 04/12/2019   Procedure: TOTAL KNEE ARTHROPLASTY;  Surgeon: Lovell Sheehan, MD;  Location: [facility];  Service: Orthopedics;  Laterality: Left;  . TOTAL KNEE ARTHROPLASTY Right 04/17/2020   Procedure: TOTAL KNEE ARTHROPLASTY;  Surgeon: Lovell Sheehan, MD;  Location: [facility];  Service: Orthopedics;  Laterality: Right;  . TUBAL LIGATION      No Known Allergies  Current Outpatient Medications  Medication Sig Dispense Refill  . amitriptyline (ELAVIL) 10 MG tablet Take 1 tablet (10 mg total) by mouth at bedtime. For IBS 90 tablet 1  . amLODipine (NORVASC) 5 MG tablet TAKE ONE TABLET BY MOUTH ONCE DAILY 90 tablet 1  . atorvastatin (LIPITOR) 40 MG tablet Take 1 tablet (40 mg total) by mouth daily. 90 tablet 1  . cholecalciferol (VITAMIN D) 1000 units tablet Take 1,000 Units by mouth daily.    Marland Kitchen  desvenlafaxine (PRISTIQ) 50 MG 24 hr tablet TAKE ONE TABLET BY MOUTH ONCE DAILY 90 tablet 1  . gabapentin (NEURONTIN) 300 MG capsule Take 300 mg by mouth 2 (two) times daily.     Marland Kitchen losartan-hydrochlorothiazide (HYZAAR) 100-25 MG tablet TAKE ONE TABLET BY MOUTH ONCE DAILY 90 tablet 0  . meloxicam (MOBIC) 15 MG tablet     . metFORMIN (GLUCOPHAGE) 500 MG tablet Take 1 tablet (500 mg total) by mouth daily with breakfast. 90 tablet 1  . NARCAN 4 MG/0.1ML LIQD nasal spray kit OPIOD EMERGENCY 1 SPRAY IN 1 NOSTRIL 1  TIME, MAY REPEAT IN OTHER NOSTRIL EVERY 2 - 3 MINS UNTIL RESPONSIVE /EMS ARRIVES    . omeprazole (PRILOSEC) 20 MG capsule TAKE ONE CAPSULE BY MOUTH ONCE DAILY 90 capsule 1  . Oxycodone HCl 20 MG TABS Take 1 tablet by mouth 4 (four) times daily.    . QUEtiapine (SEROQUEL) 25 MG tablet TAKE ONE TABLET BY MOUTH DAILY AT BEDTIME 90 tablet 1  . tiZANidine (ZANAFLEX) 4 MG tablet Take 4 mg by mouth every 12 (twelve) hours.    . valACYclovir (VALTREX) 1000 MG tablet Take 1 tablet (1,000 mg total) by mouth 2 (two) times daily. 6 tablet 1   No current facility-administered medications for this visit.    Family History Family History  Problem Relation Age of Onset  . Heart disease Mother   . Thyroid cancer Mother   . Congestive Heart Failure Mother   . Depression Daughter   . Asthma Daughter   . Breast cancer Maternal Grandmother       Social History Social History   Tobacco Use  . Smoking status: Current Every Day Smoker    Packs/day: 1.00    Years: 20.00    Pack years: 20.00    Types: Cigarettes    Start date: 07/26/2002    Last attempt to quit: 04/06/2019    Years since quitting: 1.6  . Smokeless tobacco: Never Used  . Tobacco comment: Quit Date 04/06/2019; started again Nov 21  Vaping Use  . Vaping Use: Never used  Substance Use Topics  . Alcohol use: No    Alcohol/week: 0.0 standard drinks  . Drug use: No        Review of Systems  Constitutional: Negative.   HENT: Negative.   Eyes: Negative.   Respiratory: Negative.   Cardiovascular: Negative.   Gastrointestinal: Positive for abdominal pain.  Genitourinary: Negative.   Skin: Negative.   Neurological: Positive for tingling and headaches.  Psychiatric/Behavioral: Positive for depression.      Physical Exam Blood pressure 124/83, pulse (!) 112, temperature 98.4 F (36.9 C), temperature source Oral, height $RemoveBefo'5\' 8"'wGYmGBFQwyq$  (1.727 m), weight 212 lb 9.6 oz (96.4 kg), SpO2 93 %. Last Weight  Most recent update: 12/12/2020  1:37 PM    Weight  96.4 kg (212 lb 9.6 oz)            CONSTITUTIONAL: Well developed, and nourished, appropriately responsive and aware without distress.   EYES: Sclera non-icteric.   EARS, NOSE, MOUTH AND THROAT: Mask worn.   Hearing is intact to voice.  NECK: Trachea is midline, and there is no jugular venous distension.  LYMPH NODES:  Lymph nodes in the neck are not enlarged. RESPIRATORY:  Lungs are clear, and breath sounds are equal bilaterally. Normal respiratory effort without pathologic use of accessory muscles. CARDIOVASCULAR: Heart is regular in rate and rhythm. GI: The abdomen is soft, nontender, and nondistended. There were no palpable  masses. I did not appreciate hepatosplenomegaly. There were normal bowel sounds. GU: Her left breast is notable for a subcutaneous soft fleshy mass, multilobular and readily appreciated by vision.  Estimated size is approximately 7 cm x 3 cm x 1.5 cm MUSCULOSKELETAL:  Symmetrical muscle tone appreciated in all four extremities.    SKIN: Skin turgor is normal. No pathologic skin lesions appreciated.  NEUROLOGIC:  Motor and sensation appear grossly normal.  Cranial nerves are grossly without defect. PSYCH:  Alert and oriented to person, place and time. Affect is appropriate for situation.  Data Reviewed I have personally reviewed what is currently available of the patient's imaging, recent labs and medical records.   Labs:  CBC Latest Ref Rng & Units 08/07/2020 04/19/2020 04/18/2020  WBC 3.8 - 10.8 Thousand/uL 6.7 11.6(H) 13.9(H)  Hemoglobin 11.7 - 15.5 g/dL 12.4 9.3(L) 11.1(L)  Hematocrit 35.0 - 45.0 % 37.5 28.8(L) 32.4(L)  Platelets 140 - 400 Thousand/uL 301 260 290   CMP Latest Ref Rng & Units 08/07/2020 04/18/2020 04/09/2020  Glucose 65 - 99 mg/dL 93 197(H) 92  BUN 7 - 25 mg/dL $Remove'12 11 18  'vcSHPPs$ Creatinine 0.50 - 1.05 mg/dL 0.59 0.77 0.73  Sodium 135 - 146 mmol/L 145 139 139  Potassium 3.5 - 5.3 mmol/L 4.9 3.3(L) 3.4(L)  Chloride 98 - 110 mmol/L 104 100 103  CO2  20 - 32 mmol/L $RemoveB'28 26 27  'XbLLHWCi$ Calcium 8.6 - 10.4 mg/dL 10.2 9.6 9.5  Total Protein 6.1 - 8.1 g/dL 6.7 - -  Total Bilirubin 0.2 - 1.2 mg/dL 0.4 - -  Alkaline Phos 38 - 126 U/L - - -  AST 10 - 35 U/L 17 - -  ALT 6 - 29 U/L 16 - -      Imaging: Radiology review:   CLINICAL DATA:  Screening.  EXAM: DIGITAL SCREENING BILATERAL MAMMOGRAM WITH TOMO AND CAD  COMPARISON:  Previous exam(s).  ACR Breast Density Category c: The breast tissue is heterogeneously dense, which may obscure small masses.  FINDINGS: There are no findings suspicious for malignancy. Images were processed with CAD.  IMPRESSION: No mammographic evidence of malignancy. A result letter of this screening mammogram will be mailed directly to the patient.  RECOMMENDATION: Screening mammogram in one year. (Code:SM-B-01Y)  BI-RADS CATEGORY  1: Negative.   Electronically Signed   By: Ammie Ferrier M.D.   On: 12/20/2019 09:37 Within last 24 hrs: No results found.  Assessment    Left upper breast mass, consistent with lipomatous lesion.  Patient Active Problem List   Diagnosis Date Noted  . History of total knee arthroplasty, right 04/17/2020  . S/P TKR (total knee replacement) using cement, left 04/12/2019  . Overweight (BMI 25.0-29.9) 04/06/2019  . Peptic ulcer   . Benign neoplasm of ascending colon   . Diverticulosis of large intestine without diverticulitis   . Simple chronic bronchitis ([place]) 08/03/2018  . Pancreatic lesion 04/21/2018  . Abdominal aortic atherosclerosis ([place]) 04/21/2018  . Acute peptic ulcer of stomach   . Gastric polyp   . Abdominal pain, epigastric   . Trigger thumb of right hand 02/25/2018  . Carpal tunnel syndrome on both sides 10/21/2017  . CRP elevated 08/09/2017  . Elevated C-reactive protein 07/28/2017  . Osteoarthritis of both knees 02/19/2016  . COPD, mild ([place]) 05/08/2015  . Vitamin D deficiency 05/07/2015  . Metabolic syndrome 60/08/9322  . Eczema  05/07/2015  . Chronic insomnia 05/07/2015  . Hypertension, benign 05/07/2015  . Dyslipidemia 05/07/2015  . Hyperglycemia 05/07/2015  .  Chronic low back pain 05/07/2015  . IBS (irritable bowel syndrome) 05/07/2015  . Gastroesophageal reflux disease without esophagitis 05/07/2015  . Major depression, chronic ([place]) 05/07/2015  . Menopausal syndrome (hot flashes) 05/07/2015  . History of postmenopausal bleeding 01/14/2015    Plan    We will follow-up after screening mammography.    If negative will proceed with excision of left upper breast mass, anticipating/expecting multilobulated lipomatous lesion. Risks of anesthesia, bleeding, infection, recurrence, seroma or hematoma discussed in detail with patient.  I believe she understands and desires to proceed.  Questions answered.  No guarantees were ever expressed or implied.  Face-to-face time spent with the patient and accompanying care providers(if present) was 25 minutes, with more than 50% of the time spent counseling, educating, and coordinating care of the patient.      Ronny Bacon M.D., FACS 12/12/2020, 2:15 PM

## 2020-12-12 NOTE — Patient Instructions (Addendum)
Your Mammogram is scheduled 12/31/20 @ 1:40 pm at [facility].    Our surgery scheduler will call you within 24-48 hours to schedule your surgery. Please have the Blue sheet available when speaking with her.    Lipoma  A lipoma is a noncancerous (benign) tumor that is made up of fat cells. This is a very common type of soft-tissue growth. Lipomas are usually found under the skin (subcutaneous). They may occur in any tissue of the body that contains fat. Common areas for lipomas to appear include the back, arms, shoulders, buttocks, and thighs. Lipomas grow slowly, and they are usually painless. Most lipomas do not cause problems and do not require treatment. What are the causes? The cause of this condition is not known. What increases the risk? You are more likely to develop this condition if:  You are 46-67 years old.  You have a family history of lipomas. What are the signs or symptoms? A lipoma usually appears as a small, round bump under the skin. In most cases, the lump will:  Feel soft or rubbery.  Not cause pain or other symptoms. However, if a lipoma is located in an area where it pushes on nerves, it can become painful or cause other symptoms. How is this diagnosed? A lipoma can usually be diagnosed with a physical exam. You may also have tests to confirm the diagnosis and to rule out other conditions. Tests may include:  Imaging tests, such as a CT scan or an MRI.  Removal of a tissue sample to be looked at under a microscope (biopsy). How is this treated? Treatment for this condition depends on the size of the lipoma and whether it is causing any symptoms.  For small lipomas that are not causing problems, no treatment is needed.  If a lipoma is bigger or it causes problems, surgery may be done to remove the lipoma. Lipomas can also be removed to improve appearance. Most often, the procedure is done after applying a medicine that numbs the area (local  anesthetic).  Liposuction may be done to reduce the size of the lipoma before it is removed through surgery, or it may be done to remove the lipoma. Lipomas are removed with this method in order to limit incision size and scarring. A liposuction tube is inserted through a small incision into the lipoma, and the contents of the lipoma are removed through the tube with suction. Follow these instructions at home:  Watch your lipoma for any changes.  Keep all follow-up visits as told by your health care provider. This is important. Contact a health care provider if:  Your lipoma becomes larger or hard.  Your lipoma becomes painful, red, or increasingly swollen. These could be signs of infection or a more serious condition. Get help right away if:  You develop tingling or numbness in an area near the lipoma. This could indicate that the lipoma is causing nerve damage. Summary  A lipoma is a noncancerous tumor that is made up of fat cells.  Most lipomas do not cause problems and do not require treatment.  If a lipoma is bigger or it causes problems, surgery may be done to remove the lipoma.  Contact a health care provider if your lipoma becomes larger or hard, or if it becomes painful, red, or increasingly swollen. Pain, redness, and swelling could be signs of infection or a more serious condition. This information is not intended to replace advice given to you by your health care  provider. Make sure you discuss any questions you have with your health care provider. Document Revised: 06/05/2019 Document Reviewed: 06/05/2019 Elsevier Patient Education  [place].

## 2020-12-13 ENCOUNTER — Telehealth: Payer: Self-pay | Admitting: Surgery

## 2020-12-13 NOTE — Telephone Encounter (Signed)
Outgoing call is made, left message for patient to call.  Please advise patient of Pre-Admission date/time, COVID Testing date and Surgery date.  Surgery Date: 01/08/21 Preadmission Testing Date: 01/02/21 (phone 8a-1p) Covid Testing Date: 01/06/21 - patient advised to go to the [facility] ([place]) between 8a-1p  Also patient to call at [phone], between 1-3:00pm the day before surgery, to find out what time to arrive for surgery.

## 2020-12-16 NOTE — Telephone Encounter (Signed)
Incoming call from the patient.  She is informed of all dates regarding her surgery and voices understanding.

## 2020-12-31 ENCOUNTER — Ambulatory Visit
Admission: RE | Admit: 2020-12-31 | Discharge: 2020-12-31 | Disposition: A | Discharge status: Home / Self Care | Payer: Medicare Other | Source: Ambulatory Visit | Attending: Surgery | Admitting: Surgery

## 2020-12-31 ENCOUNTER — Ambulatory Visit: Payer: Self-pay | Admitting: Surgery

## 2020-12-31 ENCOUNTER — Other Ambulatory Visit: Payer: Self-pay

## 2020-12-31 DIAGNOSIS — Z1231 Encounter for screening mammogram for malignant neoplasm of breast: Secondary | ICD-10-CM | POA: Insufficient documentation

## 2021-01-01 ENCOUNTER — Ambulatory Visit: Payer: Self-pay | Admitting: Surgery

## 2021-01-01 ENCOUNTER — Emergency Department: Payer: Medicare Other

## 2021-01-01 ENCOUNTER — Emergency Department
Admission: EM | Admit: 2021-01-01 | Discharge: 2021-01-01 | Disposition: A | Discharge status: Home / Self Care | Payer: Medicare Other | Attending: Emergency Medicine | Admitting: Emergency Medicine

## 2021-01-01 ENCOUNTER — Other Ambulatory Visit: Payer: Self-pay

## 2021-01-01 ENCOUNTER — Encounter: Payer: Self-pay | Admitting: Emergency Medicine

## 2021-01-01 DIAGNOSIS — J449 Chronic obstructive pulmonary disease, unspecified: Secondary | ICD-10-CM | POA: Diagnosis not present

## 2021-01-01 DIAGNOSIS — Z96653 Presence of artificial knee joint, bilateral: Secondary | ICD-10-CM | POA: Diagnosis not present

## 2021-01-01 DIAGNOSIS — M7989 Other specified soft tissue disorders: Secondary | ICD-10-CM

## 2021-01-01 DIAGNOSIS — R0781 Pleurodynia: Secondary | ICD-10-CM | POA: Diagnosis not present

## 2021-01-01 DIAGNOSIS — E876 Hypokalemia: Secondary | ICD-10-CM | POA: Diagnosis not present

## 2021-01-01 DIAGNOSIS — J039 Acute tonsillitis, unspecified: Secondary | ICD-10-CM

## 2021-01-01 DIAGNOSIS — I10 Essential (primary) hypertension: Secondary | ICD-10-CM | POA: Diagnosis not present

## 2021-01-01 DIAGNOSIS — J020 Streptococcal pharyngitis: Secondary | ICD-10-CM | POA: Diagnosis not present

## 2021-01-01 DIAGNOSIS — J029 Acute pharyngitis, unspecified: Secondary | ICD-10-CM | POA: Diagnosis present

## 2021-01-01 DIAGNOSIS — F17210 Nicotine dependence, cigarettes, uncomplicated: Secondary | ICD-10-CM | POA: Insufficient documentation

## 2021-01-01 DIAGNOSIS — Z79899 Other long term (current) drug therapy: Secondary | ICD-10-CM | POA: Insufficient documentation

## 2021-01-01 DIAGNOSIS — Z981 Arthrodesis status: Secondary | ICD-10-CM | POA: Diagnosis not present

## 2021-01-01 LAB — CBC WITH DIFFERENTIAL/PLATELET
Abs Immature Granulocytes: 0.08 10*3/uL — ABNORMAL HIGH (ref 0.00–0.07)
Basophils Absolute: 0 10*3/uL (ref 0.0–0.1)
Basophils Relative: 0 %
Eosinophils Absolute: 0.1 10*3/uL (ref 0.0–0.5)
Eosinophils Relative: 1 %
HCT: 37.7 % (ref 36.0–46.0)
Hemoglobin: 12.2 g/dL (ref 12.0–15.0)
Immature Granulocytes: 1 %
Lymphocytes Relative: 15 %
Lymphs Abs: 2.2 10*3/uL (ref 0.7–4.0)
MCH: 30.2 pg (ref 26.0–34.0)
MCHC: 32.4 g/dL (ref 30.0–36.0)
MCV: 93.3 fL (ref 80.0–100.0)
Monocytes Absolute: 1 10*3/uL (ref 0.1–1.0)
Monocytes Relative: 7 %
Neutro Abs: 10.6 10*3/uL — ABNORMAL HIGH (ref 1.7–7.7)
Neutrophils Relative %: 76 %
Platelets: 249 10*3/uL (ref 150–400)
RBC: 4.04 MIL/uL (ref 3.87–5.11)
RDW: 13 % (ref 11.5–15.5)
WBC: 14 10*3/uL — ABNORMAL HIGH (ref 4.0–10.5)
nRBC: 0 % (ref 0.0–0.2)

## 2021-01-01 LAB — URINALYSIS, COMPLETE (UACMP) WITH MICROSCOPIC
Bacteria, UA: NONE SEEN
Bilirubin Urine: NEGATIVE
Glucose, UA: NEGATIVE mg/dL
Hgb urine dipstick: NEGATIVE
Ketones, ur: NEGATIVE mg/dL
Leukocytes,Ua: NEGATIVE
Nitrite: NEGATIVE
Protein, ur: NEGATIVE mg/dL
Specific Gravity, Urine: 1.002 — ABNORMAL LOW (ref 1.005–1.030)
WBC, UA: NONE SEEN WBC/hpf (ref 0–5)
pH: 6 (ref 5.0–8.0)

## 2021-01-01 LAB — BASIC METABOLIC PANEL
Anion gap: 11 (ref 5–15)
BUN: 10 mg/dL (ref 6–20)
CO2: 25 mmol/L (ref 22–32)
Calcium: 9.6 mg/dL (ref 8.9–10.3)
Chloride: 101 mmol/L (ref 98–111)
Creatinine, Ser: 0.63 mg/dL (ref 0.44–1.00)
GFR, Estimated: >60 mL/min (ref >60)
Glucose, Bld: 117 mg/dL — ABNORMAL HIGH (ref 70–99)
Potassium: 3.1 mmol/L — ABNORMAL LOW (ref 3.5–5.1)
Sodium: 137 mmol/L (ref 135–145)

## 2021-01-01 LAB — GROUP A STREP BY PCR: Group A Strep by PCR: DETECTED — AB

## 2021-01-01 MED ORDER — AMOXICILLIN-POT CLAVULANATE 875-125 MG PO TABS: 1.0000 | ORAL_TABLET | Freq: Two times a day (BID) | ORAL | 0 refills | Status: DC

## 2021-01-01 MED ORDER — SODIUM CHLORIDE 0.9 % IV BOLUS
1000.0000 mL | Freq: Once | INTRAVENOUS | Status: AC
  Administered 2021-01-01: 1000 mL via INTRAVENOUS

## 2021-01-01 MED ORDER — IOHEXOL 300 MG/ML  SOLN
75.0000 mL | Freq: Once | INTRAMUSCULAR | Status: AC | PRN
  Administered 2021-01-01: 75 mL via INTRAVENOUS

## 2021-01-01 MED ORDER — SODIUM CHLORIDE 0.9 % IV SOLN
3.0000 g | Freq: Once | INTRAVENOUS | Status: AC
  Administered 2021-01-01: 3 g via INTRAVENOUS
  Filled 2021-01-01: qty 8

## 2021-01-01 MED ORDER — MAGIC MOUTHWASH: 5.0000 mL | Freq: Three times a day (TID) | ORAL | 0 refills | Status: DC | PRN

## 2021-01-01 MED ORDER — MAGIC MOUTHWASH
10.0000 mL | Freq: Once | ORAL | Status: AC
  Administered 2021-01-01: 10 mL via ORAL
  Filled 2021-01-01: qty 10

## 2021-01-01 MED ORDER — DEXAMETHASONE SODIUM PHOSPHATE 10 MG/ML IJ SOLN
10.0000 mg | Freq: Once | INTRAMUSCULAR | Status: AC
  Administered 2021-01-01: 10 mg via INTRAMUSCULAR
  Filled 2021-01-01: qty 1

## 2021-01-01 NOTE — ED Notes (Signed)
Dr. Sung at bedside.  

## 2021-01-01 NOTE — Discharge Instructions (Addendum)
1.  Take antibiotic as prescribed (Augmentin 875 mg twice daily x7 days). 2.  You may take Magic mouthwash as needed for throat discomfort. 3.  Return to the ER for worsening symptoms, persistent vomiting, difficulty breathing or other concerns.

## 2021-01-01 NOTE — ED Triage Notes (Signed)
Pt to ED from home c/o sore throat mainly left side, also states left side pain.  Denies urinary changes or injury.  Pain started yesterday.  Pt maintaining secretions, chest rise even and unlabored, in NAD at this time.

## 2021-01-01 NOTE — ED Provider Notes (Signed)
[facility] Emergency Department Provider Note   ____________________________________________   Event Date/Time   First MD Initiated Contact with Patient 01/01/21 [phone]     (approximate)  I have reviewed the triage vital signs and the nursing notes.   HISTORY  Chief Complaint Sore Throat    HPI [name] is a 60 y.o. female who presents to the ED from home with a chief complaint of sore throat.  Patient reports a 6-day history of sore throat mainly on the left side.  States it is painful to swallow her saliva and the pain radiates from her throat into her chest especially her left side.  Denies fever, chills, chest pain, shortness of breath, abdominal pain, nausea or vomiting.     Past Medical History:  Diagnosis Date  . Arthritis    bilateral knees  . Contact dermatitis and other eczema, due to unspecified cause   . COPD (chronic obstructive pulmonary disease) ([place])   . Depressive disorder   . Dysmetabolic syndrome X   . GERD (gastroesophageal reflux disease)   . Hyperlipidemia   . Hypertension   . IBS (irritable bowel syndrome)   . Insomnia   . Lumbago   . Nonspecific abnormal electrocardiogram (ECG) (EKG)   . Other ovarian failure(256.39)   . Postmenopausal atrophic vaginitis   . Symptomatic menopausal or female climacteric states   . Unspecified vitamin D deficiency     Patient Active Problem List   Diagnosis Date Noted  . Mass of soft tissue of chest 12/12/2020  . History of total knee arthroplasty, right 04/17/2020  . S/P TKR (total knee replacement) using cement, left 04/12/2019  . Overweight (BMI 25.0-29.9) 04/06/2019  . Peptic ulcer   . Benign neoplasm of ascending colon   . Diverticulosis of large intestine without diverticulitis   . Simple chronic bronchitis ([place]) 08/03/2018  . Pancreatic lesion 04/21/2018  . Abdominal aortic atherosclerosis ([place]) 04/21/2018  . Acute peptic ulcer of stomach   . Gastric polyp   .  Abdominal pain, epigastric   . Trigger thumb of right hand 02/25/2018  . Carpal tunnel syndrome on both sides 10/21/2017  . CRP elevated 08/09/2017  . Elevated C-reactive protein 07/28/2017  . Osteoarthritis of both knees 02/19/2016  . COPD, mild ([place]) 05/08/2015  . Vitamin D deficiency 05/07/2015  . Metabolic syndrome 37/08/6268  . Eczema 05/07/2015  . Chronic insomnia 05/07/2015  . Hypertension, benign 05/07/2015  . Dyslipidemia 05/07/2015  . Hyperglycemia 05/07/2015  . Chronic low back pain 05/07/2015  . IBS (irritable bowel syndrome) 05/07/2015  . Gastroesophageal reflux disease without esophagitis 05/07/2015  . Major depression, chronic ([place]) 05/07/2015  . Menopausal syndrome (hot flashes) 05/07/2015  . History of postmenopausal bleeding 01/14/2015    Past Surgical History:  Procedure Laterality Date  . BACK SURGERY    . BREAST EXCISIONAL BIOPSY Left ?   neg  . CARPAL TUNNEL RELEASE Left 08/15/2018   Procedure: CARPAL TUNNEL RELEASE;  Surgeon: Earnestine Leys, MD;  Location: [facility] ORS;  Service: Orthopedics;  Laterality: Left;  . CERVICAL DISCECTOMY     x 2; metal plate  . CHOLECYSTECTOMY    . COLONOSCOPY WITH PROPOFOL N/A 08/08/2018   Procedure: COLONOSCOPY WITH PROPOFOL;  Surgeon: Virgel Manifold, MD;  Location: [facility] ENDOSCOPY;  Service: Endoscopy;  Laterality: N/A;  . DILATATION & CURETTAGE/HYSTEROSCOPY WITH MYOSURE N/A 03/21/2015   Procedure: DILATATION & CURETTAGE/HYSTEROSCOPY;  Surgeon: Aletha Halim, MD;  Location: [facility] ORS;  Service: Gynecology;  Laterality: N/A;  .  DILATION AND CURETTAGE OF UTERUS    . ENDOMETRIAL ABLATION    . ESOPHAGOGASTRODUODENOSCOPY (EGD) WITH PROPOFOL N/A 04/13/2018   Procedure: ESOPHAGOGASTRODUODENOSCOPY (EGD) WITH PROPOFOL;  Surgeon: Virgel Manifold, MD;  Location: [facility] END[facility];  Service: Endoscopy;  Laterality: N/A;  . ESOPHAGOGASTRODUODENOSCOPY (EGD) WITH PROPOFOL N/A 08/08/2018   Procedure: ESOPHAGOGASTRODUODENOSCOPY (EGD)  WITH PROPOFOL;  Surgeon: Virgel Manifold, MD;  Location: [facility] END[facility];  Service: Endoscopy;  Laterality: N/A;  . JOINT REPLACEMENT    . SPINAL FUSION     lumbar x2  . TONSILLECTOMY    . TOTAL KNEE ARTHROPLASTY Left 04/12/2019   Procedure: TOTAL KNEE ARTHROPLASTY;  Surgeon: Lovell Sheehan, MD;  Location: [facility] OR[facility];  Service: Orthopedics;  Laterality: Left;  . TOTAL KNEE ARTHROPLASTY Right 04/17/2020   Procedure: TOTAL KNEE ARTHROPLASTY;  Surgeon: Lovell Sheehan, MD;  Location: [facility] ORS;  Service: Orthopedics;  Laterality: Right;  . TUBAL LIGATION      Prior to Admission medications   Medication Sig Start Date End Date Taking? Authorizing Provider  amoxicillin-clavulanate (AUGMENTIN) 875-125 MG tablet Take 1 tablet by mouth 2 (two) times daily. 01/01/21  Yes Paulette Blanch, MD  magic mouthwash SOLN Take 5 mLs by mouth 3 (three) times daily as needed for mouth pain. 01/01/21  Yes Paulette Blanch, MD  amitriptyline (ELAVIL) 10 MG tablet Take 1 tablet (10 mg total) by mouth at bedtime. For IBS 08/07/20   Sowles, Drue Stager, MD  amLODipine (NORVASC) 5 MG tablet TAKE ONE TABLET BY MOUTH ONCE DAILY Patient taking differently: Take 5 mg by mouth daily. 10/17/20   Steele Sizer, MD  atorvastatin (LIPITOR) 40 MG tablet Take 1 tablet (40 mg total) by mouth daily. 02/06/20   Steele Sizer, MD  cholecalciferol (VITAMIN D) 1000 units tablet Take 1,000 Units by mouth daily.    [provider]  desvenlafaxine (PRISTIQ) 50 MG 24 hr tablet TAKE ONE TABLET BY MOUTH ONCE DAILY Patient taking differently: Take 50 mg by mouth daily. 11/19/20   Steele Sizer, MD  gabapentin (NEURONTIN) 300 MG capsule Take 300 mg by mouth 2 (two) times daily.  06/12/19   Shanon Ace, MD  losartan-hydrochlorothiazide (HYZAAR) 100-25 MG tablet TAKE ONE TABLET BY MOUTH ONCE DAILY Patient taking differently: Take 1 tablet by mouth daily. 10/07/20   Steele Sizer, MD  meloxicam (MOBIC) 15 MG tablet Take 15 mg by mouth  daily. 07/09/20   [provider]  metFORMIN (GLUCOPHAGE) 500 MG tablet Take 1 tablet (500 mg total) by mouth daily with breakfast. 08/07/20   Steele Sizer, MD  Multiple Vitamins-Minerals (MULTIVITAMIN WITH MINERALS) tablet Take 1 tablet by mouth every other day.    [provider]  NARCAN 4 MG/0.1ML LIQD nasal spray kit Place 1 spray into the nose once. 03/14/19   Shanon Ace, MD  omeprazole (PRILOSEC) 20 MG capsule TAKE ONE CAPSULE BY MOUTH ONCE DAILY Patient taking differently: Take 20 mg by mouth daily. 11/19/20   Steele Sizer, MD  Oxycodone HCl 20 MG TABS Take 20 mg by mouth 4 (four) times daily. 07/16/20   [facility]  QUEtiapine (SEROQUEL) 25 MG tablet TAKE ONE TABLET BY MOUTH DAILY AT BEDTIME Patient taking differently: Take 25 mg by mouth at bedtime. 11/29/20   Steele Sizer, MD  tiZANidine (ZANAFLEX) 4 MG tablet Take 4 mg by mouth in the morning and at bedtime. 05/07/15   [provider]  valACYclovir (VALTREX) 1000 MG tablet Take 1 tablet (1,000 mg total) by mouth 2 (  two) times daily. Patient taking differently: Take 1,000 mg by mouth 2 (two) times daily as needed (breakouts). 08/07/20   Steele Sizer, MD  vitamin B-12 (CYANOCOBALAMIN) 1000 MCG tablet Take 1,000 mcg by mouth daily.    [provider]    Allergies Patient has no known allergies.  Family History  Problem Relation Age of Onset  . Heart disease Mother   . Thyroid cancer Mother   . Congestive Heart Failure Mother   . Depression Daughter   . Asthma Daughter   . Breast cancer Maternal Grandmother     Social History Social History   Tobacco Use  . Smoking status: Current Every Day Smoker    Packs/day: 1.00    Years: 20.00    Pack years: 20.00    Types: Cigarettes    Start date: 07/26/2002    Last attempt to quit: 04/06/2019    Years since quitting: 1.7  . Smokeless tobacco: Never Used  . Tobacco comment: Quit Date 04/06/2019; started again Nov 21   Vaping Use  . Vaping Use: Never used  Substance Use Topics  . Alcohol use: No    Alcohol/week: 0.0 standard drinks  . Drug use: No    Review of Systems  Constitutional: No fever/chills Eyes: No visual changes. ENT: Positive for sore throat. Cardiovascular: Denies chest pain. Respiratory: Denies shortness of breath. Gastrointestinal: No abdominal pain.  No nausea, no vomiting.  No diarrhea.  No constipation. Genitourinary: Negative for dysuria. Musculoskeletal: Negative for back pain. Skin: Negative for rash. Neurological: Negative for headaches, focal weakness or numbness.   ____________________________________________   PHYSICAL EXAM:  VITAL SIGNS: ED Triage Vitals [01/01/21 0206]  Enc Vitals Group     BP (!) 141/95     Pulse Rate (!) 105     Resp 16     Temp 98.8 F (37.1 C)     Temp Source Oral     SpO2 96 %     Weight 200 lb (90.7 kg)     Height $Remov'5\' 8"'liNgSd$  (1.727 m)     Head Circumference      Peak Flow      Pain Score 8     Pain Loc      Pain Edu?      Excl. in [place]?     Constitutional: Alert and oriented. Well appearing and in mild acute distress. Eyes: Conjunctivae are normal. PERRL. EOMI. Head: Atraumatic. Nose: No congestion/rhinnorhea. Mouth/Throat: Mucous membranes are moist.  Oropharynx erythematous without tonsillar exudate.  Difficult to visualize tonsillar swelling or fullness as patient gagging on exam.  There is no hoarse or muffled voice.  There is no drooling.  Patient tolerating secretions well. Neck: No stridor.  Supple neck without meningismus. Hematological/Lymphatic/Immunilogical: Left anterior cervical lymphadenopathy. Cardiovascular: Normal rate, regular rhythm. Grossly normal heart sounds.  Good peripheral circulation. Respiratory: Normal respiratory effort.  No retractions. Lungs CTAB.  Left lateral lower ribs tender to palpation.  No crepitus.  No splinting. Gastrointestinal: Soft and nontender to light or deep palpation. No distention.  No abdominal bruits. No CVA tenderness. Musculoskeletal: No lower extremity tenderness nor edema.  No joint effusions. Neurologic:  Normal speech and language. No gross focal neurologic deficits are appreciated. No gait instability. Skin:  Skin is warm, dry and intact. No rash noted.  No petechiae Psychiatric: Mood and affect are normal. Speech and behavior are normal.  ____________________________________________   LABS (all labs ordered are listed, but only abnormal results are displayed)  Labs Reviewed  GROUP A STREP BY PCR - Abnormal; Notable for the following components:      Result Value   Group A Strep by PCR DETECTED (*)    All other components within normal limits  CBC WITH DIFFERENTIAL/PLATELET - Abnormal; Notable for the following components:   WBC 14.0 (*)    Neutro Abs 10.6 (*)    Abs Immature Granulocytes 0.08 (*)    All other components within normal limits  BASIC METABOLIC PANEL - Abnormal; Notable for the following components:   Potassium 3.1 (*)    Glucose, Bld 117 (*)    All other components within normal limits  URINALYSIS, COMPLETE (UACMP) WITH MICROSCOPIC - Abnormal; Notable for the following components:   Color, Urine STRAW (*)    APPearance CLEAR (*)    Specific Gravity, Urine 1.002 (*)    All other components within normal limits   ____________________________________________  EKG  None ____________________________________________  RADIOLOGY I, Shalona Harbour J, personally viewed and evaluated these images (plain radiographs) as part of my medical decision making, as well as reviewing the written report by the radiologist.  ED MD interpretation: No acute cardiopulmonary process; tonsillitis without abscess  Official radiology report(s): DG Chest 2 View  Result Date: 01/01/2021 CLINICAL DATA:  Atraumatic left rib pain EXAM: CHEST - 2 VIEW COMPARISON:  12/16/2010 chest x-ray FINDINGS: Normal heart size and mediastinal contours. No acute infiltrate or  edema. No effusion or pneumothorax. ACDF. No acute osseous findings. IMPRESSION: Negative chest. Electronically Signed   By: Monte Fantasia M.D.   On: 01/01/2021 04:22   CT Soft Tissue Neck W Contrast  Result Date: 01/01/2021 CLINICAL DATA:  Epiglottitis or tonsillitis suspected. One day of throat pain EXAM: CT NECK WITH CONTRAST TECHNIQUE: Multidetector CT imaging of the neck was performed using the standard protocol following the bolus administration of intravenous contrast. CONTRAST:  16mL OMNIPAQUE IOHEXOL 300 MG/ML  SOLN COMPARISON:  None. FINDINGS: Pharynx and larynx: Thickened appearance of palatine and lingual tonsils without abscess or retropharyngeal edema, compatible with tonsillitis in the setting. There is motion at the level of the oropharynx and hypopharynx without suspected epiglottic thickening. The epiglottis is depressed by the thickened lingual tonsil. Salivary glands: No inflammation, mass, or stone. Thyroid: Normal. Lymph nodes: Prominence of bilateral lateral neck lymph nodes, presumably reactive in this setting. The nodes are homogeneous Vascular: Negative. Limited intracranial: Negative. Visualized orbits: Negative. Mastoids and visualized paranasal sinuses: Clear. Skeleton: C3-C7 ACDF. There is solid arthrodesis except at C6-7. No evidence of hardware failure. Adjacent segment facet degeneration at C2-3. Upper chest: Negative IMPRESSION: 1. Tonsillitis/cervical adenitis without abscess. 2. Motion degraded. Electronically Signed   By: Monte Fantasia M.D.   On: 01/01/2021 05:48    ____________________________________________   PROCEDURES  Procedure(s) performed (including Critical Care):  Procedures   ____________________________________________   INITIAL IMPRESSION / ASSESSMENT AND PLAN / ED COURSE  As part of my medical decision making, I reviewed the following data within the [facility] [place] [facility] notes reviewed and incorporated, Labs reviewed, Old  chart reviewed, Radiograph reviewed and Notes from prior ED visits     60 year old female presenting with sore throat, left greater than right.  Differential diagnosis includes but is not limited to viral pharyngitis, strep throat, peritonsillar abscess, retropharyngeal infection, etc.  Laboratory results remarkable for positive strep test.  Given difficulty of oral pharyngeal exam, will obtain CT soft tissues neck to evaluate for peritonsillar abscess.  Obtain mono screen, chest x-ray.  Initiate IV fluid hydration, IV  Unasyn, Decadron and Magic mouthwash.  Will reassess.  Clinical Course as of 01/01/21 0610  Wed Jan 01, 2021  0407 Updated patient and spouse of CT imaging results.  Will discharge home on Augmentin and Magic mouthwash to use as needed.  Patient will follow up closely with her PCP.  Strict return precautions given.  Both verbalized understanding and agree with plan of care. [JS]    Clinical Course User Index [JS] Paulette Blanch, MD     ____________________________________________   FINAL CLINICAL IMPRESSION(S) / ED DIAGNOSES  Final diagnoses:  Strep pharyngitis  Hypokalemia  Tonsillitis     ED Discharge Orders         Ordered    amoxicillin-clavulanate (AUGMENTIN) 875-125 MG tablet  2 times daily        01/01/21 0558    magic mouthwash SOLN  3 times daily PRN        01/01/21 0558          *Please note:  [name] was evaluated in Emergency Department on 01/01/2021 for the symptoms described in the history of present illness. She was evaluated in the context of the global COVID-19 pandemic, which necessitated consideration that the patient might be at risk for infection with the SARS-CoV-2 virus that causes COVID-19. Institutional protocols and algorithms that pertain to the evaluation of patients at risk for COVID-19 are in a state of rapid change based on information released by regulatory bodies including the CDC and federal and state organizations. These  policies and algorithms were followed during the patient's care in the ED.  Some ED evaluations and interventions may be delayed as a result of limited staffing during and the pandemic.*   Note:  This document was prepared using Dragon voice recognition software and may include unintentional dictation errors.   Paulette Blanch, MD 01/01/21 [phone]

## 2021-01-01 NOTE — ED Notes (Signed)
Dr. Beather Arbour notified on delay in mono lab d/t 3 samples being hemolyzed per lab

## 2021-01-01 NOTE — ED Notes (Signed)
Patient transported to CT 

## 2021-01-02 ENCOUNTER — Other Ambulatory Visit
Admission: RE | Admit: 2021-01-02 | Discharge: 2021-01-02 | Disposition: A | Discharge status: Home / Self Care | Payer: Medicare Other | Source: Ambulatory Visit | Attending: Surgery | Admitting: Surgery

## 2021-01-02 ENCOUNTER — Other Ambulatory Visit: Payer: Self-pay

## 2021-01-02 NOTE — Patient Instructions (Signed)
Your procedure is scheduled on: 01/08/2021- Wednesday Report to the Registration Desk on the 1st floor of the [facility]. To find out your arrival time, please call [phone] between 1PM - 3PM on: 01/07/2021- Tuesday  REMEMBER: Instructions that are not followed completely may result in serious medical risk, up to and including death; or upon the discretion of your surgeon and anesthesiologist your surgery may need to be rescheduled.  Do not eat food or drink fluids after midnight the night before surgery.  No gum chewing, lozengers or hard candies.  TAKE THESE MEDICATIONS THE MORNING OF SURGERY WITH A SIP OF WATER: - amLODipine (NORVASC) 5 MG tablet - gabapentin (NEURONTIN) 300 MG capsule - omeprazole (PRILOSEC) 20 MG capsule take one the night before and one on the morning of surgery - helps to prevent nausea after    Surgery. - Oxycodone HCl 20 MG TABS - tiZANidine (ZANAFLEX) 4 MG tablet - desvenlafaxine (PRISTIQ) 50 MG 24 hr tablet  Stop Metformin 2 days prior to surgery. Do not take 03/7, 03/08, or the morning of surgery..  One week prior to surgery: Stop taking meloxicam 01/02/2021, Stop Anti-inflammatories (NSAIDS) such as Advil, Aleve, Ibuprofen, Motrin, Naproxen, Naprosyn and Aspirin based products such as Excedrin, Goodys Powder, BC Powder.  Stop ANY OVER THE COUNTER supplements until after surgery.  No Alcohol for 24 hours before or after surgery.  No Smoking including e-cigarettes for 24 hours prior to surgery.  No chewable tobacco products for at least 6 hours prior to surgery.  No nicotine patches on the day of surgery.  Do not use any "recreational" drugs for at least a week prior to your surgery.  Please be advised that the combination of cocaine and anesthesia may have negative outcomes, up to and including death. If you test positive for cocaine, your surgery will be cancelled.  On the morning of surgery brush your teeth with toothpaste and water, you may  rinse your mouth with mouthwash if you wish. Do not swallow any toothpaste or mouthwash.  Do not wear jewelry, make-up, hairpins, clips or nail polish.  Do not wear lotions, powders, or perfumes.   Do not shave body from the neck down 48 hours prior to surgery just in case you cut yourself which could leave a site for infection.  Also, freshly shaved skin may become irritated if using the CHG soap.  Contact lenses, hearing aids and dentures may not be worn into surgery.  Do not bring valuables to the hospital. [facility] is not responsible for any missing/lost belongings or valuables.   Use CHG Soap or wipes as directed on instruction sheet.  Notify your doctor if there is any change in your medical condition (cold, fever, infection).  Wear comfortable clothing (specific to your surgery type) to the hospital.  Plan for stool softeners for home use; pain medications have a tendency to cause constipation. You can also help prevent constipation by eating foods high in fiber such as fruits and vegetables and drinking plenty of fluids as your diet allows.  After surgery, you can help prevent lung complications by doing breathing exercises.  Take deep breaths and cough every 1-2 hours. Your doctor may order a device called an Incentive Spirometer to help you take deep breaths. When coughing or sneezing, hold a pillow firmly against your incision with both hands. This is called "splinting." Doing this helps protect your incision. It also decreases belly discomfort.  If you are being admitted to the hospital overnight, leave  your suitcase in the car. After surgery it may be brought to your room.  If you are being discharged the day of surgery, you will not be allowed to drive home. You will need a responsible adult (18 years or older) to drive you home and stay with you that night.   If you are taking public transportation, you will need to have a responsible adult (18 years or older) with  you. Please confirm with your physician that it is acceptable to use public transportation.   Please call the [facility] at [phone] if you have any questions about these instructions.  Surgery Visitation Policy:  Patients undergoing a surgery or procedure may have one family member or support person with them as long as that person is not COVID-19 positive or experiencing its symptoms.  That person may remain in the waiting area during the procedure.  Inpatient Visitation:    Visiting hours are 7 a.m. to 8 p.m. Inpatients will be allowed two visitors daily. The visitors may change each day during the patient's stay. No visitors under the age of 58. Any visitor under the age of 71 must be accompanied by an adult. The visitor must pass COVID-19 screenings, use hand sanitizer when entering and exiting the patient's room and wear a mask at all times, including in the patient's room. Patients must also wear a mask when staff or their visitor are in the room. Masking is required regardless of vaccination status.

## 2021-01-06 ENCOUNTER — Other Ambulatory Visit: Payer: Self-pay

## 2021-01-06 ENCOUNTER — Encounter
Admission: RE | Admit: 2021-01-06 | Discharge: 2021-01-06 | Disposition: A | Discharge status: Home / Self Care | Payer: Medicare Other | Source: Ambulatory Visit | Attending: Surgery | Admitting: Surgery

## 2021-01-06 DIAGNOSIS — Z20822 Contact with and (suspected) exposure to covid-19: Secondary | ICD-10-CM | POA: Insufficient documentation

## 2021-01-06 DIAGNOSIS — Z01818 Encounter for other preprocedural examination: Secondary | ICD-10-CM | POA: Insufficient documentation

## 2021-01-06 DIAGNOSIS — I10 Essential (primary) hypertension: Secondary | ICD-10-CM | POA: Insufficient documentation

## 2021-01-06 LAB — CBC
HCT: 36.6 % (ref 36.0–46.0)
Hemoglobin: 11.9 g/dL — ABNORMAL LOW (ref 12.0–15.0)
MCH: 30.5 pg (ref 26.0–34.0)
MCHC: 32.5 g/dL (ref 30.0–36.0)
MCV: 93.8 fL (ref 80.0–100.0)
Platelets: 350 10*3/uL (ref 150–400)
RBC: 3.9 MIL/uL (ref 3.87–5.11)
RDW: 13 % (ref 11.5–15.5)
WBC: 11.1 10*3/uL — ABNORMAL HIGH (ref 4.0–10.5)
nRBC: 0 % (ref 0.0–0.2)

## 2021-01-06 LAB — BASIC METABOLIC PANEL
Anion gap: 8 (ref 5–15)
BUN: 13 mg/dL (ref 6–20)
CO2: 27 mmol/L (ref 22–32)
Calcium: 9 mg/dL (ref 8.9–10.3)
Chloride: 102 mmol/L (ref 98–111)
Creatinine, Ser: 0.6 mg/dL (ref 0.44–1.00)
GFR, Estimated: >60 mL/min (ref >60)
Glucose, Bld: 77 mg/dL (ref 70–99)
Potassium: 3.6 mmol/L (ref 3.5–5.1)
Sodium: 137 mmol/L (ref 135–145)

## 2021-01-06 NOTE — Progress Notes (Signed)
No show to covid testing today. Call to patient, message left. Call to Dr. Forest Becker office to inform them. Will reschedule appt for tomorrow; 01/07/21

## 2021-01-06 NOTE — Progress Notes (Signed)
Patient arrived for appt after all

## 2021-01-07 ENCOUNTER — Other Ambulatory Visit: Payer: Self-pay | Admitting: Surgery

## 2021-01-07 DIAGNOSIS — N6320 Unspecified lump in the left breast, unspecified quadrant: Secondary | ICD-10-CM

## 2021-01-07 LAB — SARS CORONAVIRUS 2 (TAT 6-24 HRS): SARS Coronavirus 2: NEGATIVE

## 2021-01-08 ENCOUNTER — Ambulatory Visit
Admission: RE | Admit: 2021-01-08 | Discharge: 2021-01-08 | Disposition: A | Discharge status: Home / Self Care | Payer: Medicare Other | Attending: Surgery | Admitting: Surgery

## 2021-01-08 ENCOUNTER — Encounter
Admission: RE | Disposition: A | Discharge status: Home / Self Care | Payer: Self-pay | Source: Home / Self Care | Attending: Surgery

## 2021-01-08 ENCOUNTER — Other Ambulatory Visit: Payer: Self-pay

## 2021-01-08 ENCOUNTER — Ambulatory Visit: Payer: Medicare Other | Admitting: Anesthesiology

## 2021-01-08 ENCOUNTER — Encounter: Payer: Self-pay | Admitting: Surgery

## 2021-01-08 ENCOUNTER — Ambulatory Visit
Admission: RE | Admit: 2021-01-08 | Discharge: 2021-01-08 | Disposition: A | Discharge status: Home / Self Care | Payer: Medicare Other | Source: Ambulatory Visit | Attending: Surgery | Admitting: Surgery

## 2021-01-08 DIAGNOSIS — Z79899 Other long term (current) drug therapy: Secondary | ICD-10-CM | POA: Diagnosis not present

## 2021-01-08 DIAGNOSIS — Z791 Long term (current) use of non-steroidal anti-inflammatories (NSAID): Secondary | ICD-10-CM | POA: Insufficient documentation

## 2021-01-08 DIAGNOSIS — D1779 Benign lipomatous neoplasm of other sites: Secondary | ICD-10-CM | POA: Diagnosis not present

## 2021-01-08 DIAGNOSIS — N6320 Unspecified lump in the left breast, unspecified quadrant: Secondary | ICD-10-CM | POA: Diagnosis not present

## 2021-01-08 DIAGNOSIS — F17210 Nicotine dependence, cigarettes, uncomplicated: Secondary | ICD-10-CM | POA: Diagnosis not present

## 2021-01-08 DIAGNOSIS — Z7984 Long term (current) use of oral hypoglycemic drugs: Secondary | ICD-10-CM | POA: Insufficient documentation

## 2021-01-08 DIAGNOSIS — M7989 Other specified soft tissue disorders: Secondary | ICD-10-CM

## 2021-01-08 DIAGNOSIS — D171 Benign lipomatous neoplasm of skin and subcutaneous tissue of trunk: Secondary | ICD-10-CM | POA: Diagnosis not present

## 2021-01-08 HISTORY — PX: BREAST LUMPECTOMY: SHX2

## 2021-01-08 SURGERY — BREAST LUMPECTOMY
Anesthesia: General | Laterality: Left

## 2021-01-08 MED ORDER — BUPIVACAINE-EPINEPHRINE (PF) 0.25% -1:200000 IJ SOLN
INTRAMUSCULAR | Status: AC
  Filled 2021-01-08: qty 30

## 2021-01-08 MED ORDER — ACETAMINOPHEN 500 MG PO TABS
1000.0000 mg | ORAL_TABLET | ORAL | Status: AC
  Administered 2021-01-08: 1000 mg via ORAL

## 2021-01-08 MED ORDER — MIDAZOLAM HCL 2 MG/2ML IJ SOLN
INTRAMUSCULAR | Status: AC
  Filled 2021-01-08: qty 2

## 2021-01-08 MED ORDER — ONDANSETRON HCL 4 MG/2ML IJ SOLN
INTRAMUSCULAR | Status: DC | PRN
  Administered 2021-01-08: 4 mg via INTRAVENOUS

## 2021-01-08 MED ORDER — PROPOFOL 10 MG/ML IV BOLUS
INTRAVENOUS | Status: AC
  Filled 2021-01-08: qty 20

## 2021-01-08 MED ORDER — ONDANSETRON HCL 4 MG/2ML IJ SOLN: 4.0000 mg | Freq: Once | INTRAMUSCULAR | Status: DC | PRN

## 2021-01-08 MED ORDER — FAMOTIDINE 20 MG PO TABS: 20.0000 mg | ORAL_TABLET | Freq: Once | ORAL | Status: DC

## 2021-01-08 MED ORDER — LIDOCAINE HCL (CARDIAC) PF 100 MG/5ML IV SOSY
PREFILLED_SYRINGE | INTRAVENOUS | Status: DC | PRN
  Administered 2021-01-08: 50 mg via INTRAVENOUS

## 2021-01-08 MED ORDER — LIDOCAINE HCL (PF) 2 % IJ SOLN
INTRAMUSCULAR | Status: AC
  Filled 2021-01-08: qty 5

## 2021-01-08 MED ORDER — IBUPROFEN 800 MG PO TABS: 800.0000 mg | ORAL_TABLET | Freq: Three times a day (TID) | ORAL | 0 refills | Status: DC | PRN

## 2021-01-08 MED ORDER — MIDAZOLAM HCL 2 MG/2ML IJ SOLN
INTRAMUSCULAR | Status: DC | PRN
  Administered 2021-01-08: 2 mg via INTRAVENOUS

## 2021-01-08 MED ORDER — PROPOFOL 10 MG/ML IV BOLUS
INTRAVENOUS | Status: DC | PRN
Start: 2021-01-08 — End: 2021-01-08
  Administered 2021-01-08: 180 mg via INTRAVENOUS

## 2021-01-08 MED ORDER — OXYCODONE HCL 5 MG PO TABS: 5.0000 mg | ORAL_TABLET | Freq: Once | ORAL | Status: DC | PRN

## 2021-01-08 MED ORDER — LACTATED RINGERS IV SOLN: INTRAVENOUS | Status: DC

## 2021-01-08 MED ORDER — OXYCODONE HCL 5 MG/5ML PO SOLN: 5.0000 mg | Freq: Once | ORAL | Status: DC | PRN

## 2021-01-08 MED ORDER — DEXAMETHASONE SODIUM PHOSPHATE 10 MG/ML IJ SOLN
INTRAMUSCULAR | Status: AC
  Filled 2021-01-08: qty 1

## 2021-01-08 MED ORDER — CHLORHEXIDINE GLUCONATE CLOTH 2 % EX PADS: 6.0000 | MEDICATED_PAD | Freq: Once | CUTANEOUS | Status: DC

## 2021-01-08 MED ORDER — ACETAMINOPHEN 500 MG PO TABS
ORAL_TABLET | ORAL | Status: AC
  Filled 2021-01-08: qty 2

## 2021-01-08 MED ORDER — BUPIVACAINE-EPINEPHRINE 0.25% -1:200000 IJ SOLN
INTRAMUSCULAR | Status: DC | PRN
  Administered 2021-01-08: 10 mL

## 2021-01-08 MED ORDER — CEFAZOLIN SODIUM-DEXTROSE 2-4 GM/100ML-% IV SOLN: 2.0000 g | INTRAVENOUS | Status: DC

## 2021-01-08 MED ORDER — DEXMEDETOMIDINE (PRECEDEX) IN NS 20 MCG/5ML (4 MCG/ML) IV SYRINGE
PREFILLED_SYRINGE | INTRAVENOUS | Status: DC | PRN
  Administered 2021-01-08: 8 ug via INTRAVENOUS

## 2021-01-08 MED ORDER — DEXMEDETOMIDINE (PRECEDEX) IN NS 20 MCG/5ML (4 MCG/ML) IV SYRINGE
PREFILLED_SYRINGE | INTRAVENOUS | Status: AC
  Filled 2021-01-08: qty 5

## 2021-01-08 MED ORDER — FENTANYL CITRATE (PF) 100 MCG/2ML IJ SOLN
INTRAMUSCULAR | Status: AC
  Filled 2021-01-08: qty 2

## 2021-01-08 MED ORDER — CHLORHEXIDINE GLUCONATE 0.12 % MT SOLN
15.0000 mL | Freq: Once | OROMUCOSAL | Status: AC
  Administered 2021-01-08: 15 mL via OROMUCOSAL

## 2021-01-08 MED ORDER — ONDANSETRON HCL 4 MG/2ML IJ SOLN
INTRAMUSCULAR | Status: AC
  Filled 2021-01-08: qty 2

## 2021-01-08 MED ORDER — GABAPENTIN 300 MG PO CAPS
300.0000 mg | ORAL_CAPSULE | ORAL | Status: AC
  Administered 2021-01-08: 300 mg via ORAL

## 2021-01-08 MED ORDER — GABAPENTIN 300 MG PO CAPS
ORAL_CAPSULE | ORAL | Status: AC
  Filled 2021-01-08: qty 1

## 2021-01-08 MED ORDER — ORAL CARE MOUTH RINSE: 15.0000 mL | Freq: Once | OROMUCOSAL | Status: AC

## 2021-01-08 MED ORDER — BUPIVACAINE LIPOSOME 1.3 % IJ SUSP
INTRAMUSCULAR | Status: AC
  Filled 2021-01-08: qty 20

## 2021-01-08 MED ORDER — DEXAMETHASONE SODIUM PHOSPHATE 10 MG/ML IJ SOLN
INTRAMUSCULAR | Status: DC | PRN
  Administered 2021-01-08: 5 mg via INTRAVENOUS

## 2021-01-08 MED ORDER — FENTANYL CITRATE (PF) 100 MCG/2ML IJ SOLN
INTRAMUSCULAR | Status: DC | PRN
  Administered 2021-01-08 (×4): 25 ug via INTRAVENOUS

## 2021-01-08 MED ORDER — FENTANYL CITRATE (PF) 100 MCG/2ML IJ SOLN: 25.0000 ug | INTRAMUSCULAR | Status: DC | PRN

## 2021-01-08 MED ORDER — CHLORHEXIDINE GLUCONATE 0.12 % MT SOLN
OROMUCOSAL | Status: AC
  Filled 2021-01-08: qty 15

## 2021-01-08 MED ORDER — PHENYLEPHRINE HCL (PRESSORS) 10 MG/ML IV SOLN
INTRAVENOUS | Status: AC
  Filled 2021-01-08: qty 1

## 2021-01-08 MED ORDER — CEFAZOLIN SODIUM-DEXTROSE 2-4 GM/100ML-% IV SOLN
INTRAVENOUS | Status: AC
  Filled 2021-01-08: qty 100

## 2021-01-08 SURGICAL SUPPLY — 37 items
ADH SKN CLS APL DERMABOND .7 (GAUZE/BANDAGES/DRESSINGS) ×1
APL PRP STRL LF DISP 70% ISPRP (MISCELLANEOUS) ×1
APPLIER CLIP 9.375 SM OPEN (CLIP)
APR CLP SM 9.3 20 MLT OPN (CLIP)
BLADE SURG 15 STRL LF DISP TIS (BLADE) ×1 IMPLANT
BLADE SURG 15 STRL SS (BLADE) ×2
CHLORAPREP W/TINT 26 (MISCELLANEOUS) ×2 IMPLANT
CLIP APPLIE 9.375 SM OPEN (CLIP) IMPLANT
CNTNR SPEC 2.5X3XGRAD LEK (MISCELLANEOUS)
CONT SPEC 4OZ STER OR WHT (MISCELLANEOUS)
CONT SPEC 4OZ STRL OR WHT (MISCELLANEOUS)
CONTAINER SPEC 2.5X3XGRAD LEK (MISCELLANEOUS) IMPLANT
COVER WAND RF STERILE (DRAPES) ×2 IMPLANT
DERMABOND ADVANCED (GAUZE/BANDAGES/DRESSINGS) ×1
DERMABOND ADVANCED .7 DNX12 (GAUZE/BANDAGES/DRESSINGS) ×1 IMPLANT
DEVICE DUBIN SPECIMEN MAMMOGRA (MISCELLANEOUS) IMPLANT
DRAPE LAPAROTOMY TRNSV 106X77 (MISCELLANEOUS) ×2 IMPLANT
ELECT CAUTERY BLADE TIP 2.5 (TIP) ×2
ELECT REM PT RETURN 9FT ADLT (ELECTROSURGICAL) ×2
ELECTRODE CAUTERY BLDE TIP 2.5 (TIP) ×1 IMPLANT
ELECTRODE REM PT RTRN 9FT ADLT (ELECTROSURGICAL) ×1 IMPLANT
GLOVE ORTHO TXT STRL SZ7.5 (GLOVE) ×2 IMPLANT
GOWN STRL REUS W/ TWL LRG LVL3 (GOWN DISPOSABLE) ×2 IMPLANT
GOWN STRL REUS W/TWL LRG LVL3 (GOWN DISPOSABLE) ×4
KIT MARKER MARGIN INK (KITS) IMPLANT
KIT TURNOVER KIT A (KITS) ×2 IMPLANT
MANIFOLD NEPTUNE II (INSTRUMENTS) ×2 IMPLANT
MARKER MARGIN CORRECT CLIP (MARKER) IMPLANT
NEEDLE HYPO 22GX1.5 SAFETY (NEEDLE) ×2 IMPLANT
PACK BASIN MINOR ARMC (MISCELLANEOUS) ×2 IMPLANT
SUT MNCRL 4-0 (SUTURE) ×2
SUT MNCRL 4-0 27XMFL (SUTURE) ×1
SUT VIC AB 3-0 SH 27 (SUTURE) ×2
SUT VIC AB 3-0 SH 27X BRD (SUTURE) ×1 IMPLANT
SUTURE MNCRL 4-0 27XMF (SUTURE) ×1 IMPLANT
SYR 10ML LL (SYRINGE) ×2 IMPLANT
WATER STERILE IRR 1000ML POUR (IV SOLUTION) ×2 IMPLANT

## 2021-01-08 NOTE — Op Note (Signed)
Left breast lumpectomy(excision left subclavian mass)  Pre-operative Diagnosis: Left breast lipoma, subclavian location.  Post-operative Diagnosis: same.    Surgeon: Ronny Bacon, M.D., FACS  Anesthesia: General  Findings: Multilobulated lipomatous mass as anticipated.  Estimated Blood Loss: 5 mL         Specimens: Multiple lobulated mature lipomatous tissues, sent as specimen.          Complications: none              Procedure Details  The patient was seen again in the Holding Room. The benefits, complications, treatment options, and expected outcomes were discussed with the patient. The risks of bleeding, infection, recurrence of symptoms, failure to resolve symptoms, unanticipated injury, prosthetic placement, prosthetic infection, any of which could require further surgery were reviewed with the patient. The likelihood of improving the patient's symptoms with return to their baseline status is good.  The patient and/or family concurred with the proposed plan, giving informed consent.  The patient was taken to Operating Room, identified and the procedure verified.    Prior to the induction of general anesthesia, antibiotic prophylaxis was administered. VTE prophylaxis was in place.  General anesthesia was then administered and tolerated well. After the induction, the patient was positioned in the supine position and the left upper breast subclavian, supraclavicular area and lower neck were prepped with Chloraprep and draped in the sterile fashion.  A Time Out was held and the above information confirmed.  Local infiltration of quarter percent Marcaine with epinephrine is utilized to infiltrate the dermis overlying the mass, and the adjacent subcu tissues surrounding the mass.  An incision was made along the skin lines extended down to the lipomatous subcutaneous mass.  The larger portions of the mass were then shelled out easily, the more peripheral lobulations were excised, utilizing  electrosurgery to maintain excellent hemostasis.  Repalpated multiple times to ensure that the mass of concern was completely removed.  I then confirmed hemostasis with electrocautery.  We then irrigated the cavity.  I closed the dermis with interrupted 3-0 Vicryl dermal sutures.  And I closed the skin with a running 4-0 Monocryl subcuticular.  Dermabond was applied to seal the skin. Patient tolerated procedure well.      Ronny Bacon M.D., [facility] 01/08/2021 8:21 AM

## 2021-01-08 NOTE — Interval H&P Note (Signed)
History and Physical Interval Note:  01/08/2021 7:23 AM  [name]  has presented today for surgery, with the diagnosis of left breast mass.  The various methods of treatment have been discussed with the patient and family. After consideration of risks, benefits and other options for treatment, the patient has consented to  Procedure(s): BREAST LUMPECTOMY (Left) as a surgical intervention.  The patient's history has been reviewed, patient examined, no change in status, stable for surgery.  I have reviewed the patient's chart and labs.  Questions were answered to the patient's satisfaction.   The LEFT side is marked.   Ronny Bacon  Ronny Bacon, M.D., [facility]  01/08/2021 ; 7:23 AM

## 2021-01-08 NOTE — Anesthesia Procedure Notes (Signed)
Procedure Name: LMA Insertion Date/Time: 01/08/2021 7:36 AM Performed by: Jonna Clark, CRNA Pre-anesthesia Checklist: Patient identified, Patient being monitored, Timeout performed, Emergency Drugs available and Suction available Patient Re-evaluated:Patient Re-evaluated prior to induction Oxygen Delivery Method: Circle system utilized Preoxygenation: Pre-oxygenation with 100% oxygen Induction Type: IV induction Ventilation: Mask ventilation without difficulty LMA: LMA inserted LMA Size: 4.0 Tube type: Oral Number of attempts: 1 Placement Confirmation: positive ETCO2 and breath sounds checked- equal and bilateral Tube secured with: Tape Dental Injury: Teeth and Oropharynx as per pre-operative assessment

## 2021-01-08 NOTE — Transfer of Care (Signed)
Immediate Anesthesia Transfer of Care Note  Patient: PAXTYN WISDOM  Procedure(s) Performed: BREAST LUMPECTOMY (Left )  Patient Location: PACU  Anesthesia Type:General  Level of Consciousness: drowsy and patient cooperative  Airway & Oxygen Therapy: Patient Spontanous Breathing and Patient connected to nasal cannula oxygen  Post-op Assessment: Report given to RN and Post -op Vital signs reviewed and stable  Post vital signs: Reviewed and stable  Last Vitals:  Vitals Value Taken Time  BP 143/91 01/08/21 0825  Temp    Pulse 78 01/08/21 0825  Resp 16 01/08/21 0825  SpO2 100 % 01/08/21 0825  Vitals shown include unvalidated device data.  Last Pain:  Vitals:   01/08/21 0621  TempSrc: Temporal  PainSc: 6          Complications: No complications documented.

## 2021-01-08 NOTE — Anesthesia Preprocedure Evaluation (Addendum)
Anesthesia Evaluation  Patient identified by MRN, date of birth, ID band Patient awake    Reviewed: Allergy & Precautions, H&P , NPO status , Patient's Chart, lab work & pertinent test results  History of Anesthesia Complications Negative for: history of anesthetic complications  Airway Mallampati: II  TM Distance: >3 FB Neck ROM: full    Dental  (+) Teeth Intact   Pulmonary neg sleep apnea, COPD, Current Smoker,    breath sounds clear to auscultation       Cardiovascular hypertension, (-) angina(-) Past MI and (-) Cardiac Stents (-) dysrhythmias  Rhythm:regular Rate:Normal     Neuro/Psych PSYCHIATRIC DISORDERS Depression negative neurological ROS     GI/Hepatic Neg liver ROS, PUD, GERD  Controlled,  Endo/Other  negative endocrine ROS  Renal/GU      Musculoskeletal   Abdominal   Peds  Hematology negative hematology ROS (+)   Anesthesia Other Findings Past Medical History: No date: Arthritis     Comment:  bilateral knees No date: Contact dermatitis and other eczema, due to unspecified cause No date: COPD (chronic obstructive pulmonary disease) (HCC) No date: Depressive disorder No date: Dysmetabolic syndrome X No date: GERD (gastroesophageal reflux disease) No date: Hyperlipidemia No date: Hypertension No date: IBS (irritable bowel syndrome) No date: Insomnia No date: Lumbago No date: Nonspecific abnormal electrocardiogram (ECG) (EKG) No date: Other ovarian failure(256.39) No date: Postmenopausal atrophic vaginitis No date: Symptomatic menopausal or female climacteric states No date: Unspecified vitamin D deficiency  Past Surgical History: No date: BACK SURGERY ?: BREAST EXCISIONAL BIOPSY; Left     Comment:  neg 08/15/2018: CARPAL TUNNEL RELEASE; Left     Comment:  Procedure: CARPAL TUNNEL RELEASE;  Surgeon: Earnestine Leys, MD;  Location: [facility];  Service: Orthopedics;                 Laterality: Left; No date: CERVICAL DISCECTOMY     Comment:  x 2; metal plate No date: CHOLECYSTECTOMY 08/08/2018: COLONOSCOPY WITH PROPOFOL; N/A     Comment:  Procedure: COLONOSCOPY WITH PROPOFOL;  Surgeon:               Virgel Manifold, MD;  Location: [facility];                Service: Endoscopy;  Laterality: N/A; 03/21/2015: DILATATION & CURETTAGE/HYSTEROSCOPY WITH MYOSURE; N/A     Comment:  Procedure: DILATATION & CURETTAGE/HYSTEROSCOPY;                Surgeon: Aletha Halim, MD;  Location: [facility];                Service: Gynecology;  Laterality: N/A; No date: DILATION AND CURETTAGE OF UTERUS No date: ENDOMETRIAL ABLATION 04/13/2018: ESOPHAGOGASTRODUODENOSCOPY (EGD) WITH PROPOFOL; N/A     Comment:  Procedure: ESOPHAGOGASTRODUODENOSCOPY (EGD) WITH               PROPOFOL;  Surgeon: Virgel Manifold, MD;  Location:               [facility];  Service: Endoscopy;  Laterality: N/A; 08/08/2018: ESOPHAGOGASTRODUODENOSCOPY (EGD) WITH PROPOFOL; N/A     Comment:  Procedure: ESOPHAGOGASTRODUODENOSCOPY (EGD) WITH               PROPOFOL;  Surgeon: Virgel Manifold, MD;  Location:               [facility];  Service: Endoscopy;  Laterality: N/A; No date: JOINT REPLACEMENT No date: SPINAL FUSION     Comment:  lumbar x2 No date: TONSILLECTOMY 04/12/2019: TOTAL KNEE ARTHROPLASTY; Left     Comment:  Procedure: TOTAL KNEE ARTHROPLASTY;  Surgeon: Lovell Sheehan, MD;  Location: [facility];  Service: Orthopedics;               Laterality: Left; 04/17/2020: TOTAL KNEE ARTHROPLASTY; Right     Comment:  Procedure: TOTAL KNEE ARTHROPLASTY;  Surgeon: Lovell Sheehan, MD;  Location: [facility];  Service: Orthopedics;               Laterality: Right; No date: TUBAL LIGATION  BMI    Body Mass Index: 30.41 kg/m      Reproductive/Obstetrics negative OB ROS                           Anesthesia Physical Anesthesia Plan  ASA:  II  Anesthesia Plan: General LMA   Post-op Pain Management:    Induction:   PONV Risk Score and Plan: Dexamethasone, Ondansetron, Midazolam and Treatment may vary due to age or medical condition  Airway Management Planned:   Additional Equipment:   Intra-op Plan:   Post-operative Plan:   Informed Consent: I have reviewed the patients History and Physical, chart, labs and discussed the procedure including the risks, benefits and alternatives for the proposed anesthesia with the patient or authorized representative who has indicated his/her understanding and acceptance.     Dental Advisory Given  Plan Discussed with: Anesthesiologist, CRNA and Surgeon  Anesthesia Plan Comments:         Anesthesia Quick Evaluation

## 2021-01-08 NOTE — Discharge Instructions (Signed)

## 2021-01-08 NOTE — Progress Notes (Signed)
Called Dr. Lubertha Basque concerning postop BP being 155/83 with a preop BP of 103/63. Dr. Lubertha Basque is fine with patient going home to take the BP medicine she did not take prior to surgery.

## 2021-01-08 NOTE — Anesthesia Postprocedure Evaluation (Signed)
Anesthesia Post Note  Patient: [name]  Procedure(s) Performed: BREAST LUMPECTOMY (Left )  Patient location during evaluation: PACU Anesthesia Type: General Level of consciousness: awake and alert Pain management: pain level controlled Vital Signs Assessment: post-procedure vital signs reviewed and stable Respiratory status: spontaneous breathing, nonlabored ventilation and respiratory function stable Cardiovascular status: blood pressure returned to baseline and stable Postop Assessment: no apparent nausea or vomiting Anesthetic complications: no   No complications documented.   Last Vitals:  Vitals:   01/08/21 0916 01/08/21 0932  BP: (!) 155/83 (!) 148/78  Pulse: 77 71  Resp: 16 16  Temp: 36.6 C   SpO2: 98% 100%    Last Pain:  Vitals:   01/08/21 0932  TempSrc:   PainSc: 2                  Tera Mater

## 2021-01-09 ENCOUNTER — Encounter: Payer: Self-pay | Admitting: Surgery

## 2021-01-09 LAB — SURGICAL PATHOLOGY

## 2021-01-10 DIAGNOSIS — M25552 Pain in left hip: Secondary | ICD-10-CM | POA: Diagnosis not present

## 2021-01-10 DIAGNOSIS — M170 Bilateral primary osteoarthritis of knee: Secondary | ICD-10-CM | POA: Diagnosis not present

## 2021-01-10 DIAGNOSIS — M25562 Pain in left knee: Secondary | ICD-10-CM | POA: Diagnosis not present

## 2021-01-10 DIAGNOSIS — G894 Chronic pain syndrome: Secondary | ICD-10-CM | POA: Diagnosis not present

## 2021-01-10 DIAGNOSIS — M25561 Pain in right knee: Secondary | ICD-10-CM | POA: Diagnosis not present

## 2021-01-10 DIAGNOSIS — M25511 Pain in right shoulder: Secondary | ICD-10-CM | POA: Diagnosis not present

## 2021-01-10 DIAGNOSIS — M25512 Pain in left shoulder: Secondary | ICD-10-CM | POA: Diagnosis not present

## 2021-01-10 DIAGNOSIS — M25551 Pain in right hip: Secondary | ICD-10-CM | POA: Diagnosis not present

## 2021-01-10 DIAGNOSIS — M79661 Pain in right lower leg: Secondary | ICD-10-CM | POA: Diagnosis not present

## 2021-01-10 DIAGNOSIS — M542 Cervicalgia: Secondary | ICD-10-CM | POA: Diagnosis not present

## 2021-01-10 DIAGNOSIS — M545 Low back pain, unspecified: Secondary | ICD-10-CM | POA: Diagnosis not present

## 2021-01-10 DIAGNOSIS — M79662 Pain in left lower leg: Secondary | ICD-10-CM | POA: Diagnosis not present

## 2021-01-15 DIAGNOSIS — Z03818 Encounter for observation for suspected exposure to other biological agents ruled out: Secondary | ICD-10-CM | POA: Diagnosis not present

## 2021-01-15 DIAGNOSIS — J0100 Acute maxillary sinusitis, unspecified: Secondary | ICD-10-CM | POA: Diagnosis not present

## 2021-01-23 ENCOUNTER — Ambulatory Visit (INDEPENDENT_AMBULATORY_CARE_PROVIDER_SITE_OTHER): Payer: Medicare Other | Admitting: Surgery

## 2021-01-23 ENCOUNTER — Other Ambulatory Visit: Payer: Self-pay

## 2021-01-23 ENCOUNTER — Encounter: Payer: Self-pay | Admitting: Surgery

## 2021-01-23 ENCOUNTER — Encounter: Payer: Medicare Other | Admitting: Surgery

## 2021-01-23 VITALS — BP 166/85 | HR 84 | Temp 98.4°F | Ht 68.0 in | Wt 209.8 lb

## 2021-01-23 DIAGNOSIS — Z9889 Other specified postprocedural states: Secondary | ICD-10-CM | POA: Insufficient documentation

## 2021-01-23 NOTE — Patient Instructions (Signed)
If you have any concerns or questions, please feel free to call our office. Follow up as needed. Continue annual mammograms.

## 2021-01-23 NOTE — Progress Notes (Signed)
[facility] POST-OP OFFICE VISIT  01/23/2021  HPI: [name] is a 60 y.o. female 15 days s/p left upper breast lumpectomy/lipoma excision.  She reports no drainage, no pain.  She states it itches and she has been trying to avoid it.  Vital signs: BP (!) 166/85   Pulse 84   Temp 98.4 F (36.9 C) (Oral)   Ht 5\' 8"  (1.727 m)   Wt 209 lb 12.8 oz (95.2 kg)   SpO2 94%   BMI 31.90 kg/m    Physical Exam: Constitutional: She appears well Left breast: Levada Dy present as chaperone.  No evidence of ecchymosis, seroma, induration. Skin: Incision well-healed.  Assessment/Plan: This is a 60 y.o. female 15 days s/p excision of lipoma from upper left breast.  Pathology completely benign.  I do not believe there is any need to change the frequency of her mammography for screening.  Patient Active Problem List   Diagnosis Date Noted  . Mass of soft tissue of chest 12/12/2020  . History of total knee arthroplasty, right 04/17/2020  . S/P TKR (total knee replacement) using cement, left 04/12/2019  . Overweight (BMI 25.0-29.9) 04/06/2019  . Peptic ulcer   . Benign neoplasm of ascending colon   . Diverticulosis of large intestine without diverticulitis   . Simple chronic bronchitis ([place]) 08/03/2018  . Pancreatic lesion 04/21/2018  . Abdominal aortic atherosclerosis ([place]) 04/21/2018  . Acute peptic ulcer of stomach   . Gastric polyp   . Abdominal pain, epigastric   . Trigger thumb of right hand 02/25/2018  . Carpal tunnel syndrome on both sides 10/21/2017  . CRP elevated 08/09/2017  . Elevated C-reactive protein 07/28/2017  . Osteoarthritis of both knees 02/19/2016  . COPD, mild ([place]) 05/08/2015  . Vitamin D deficiency 05/07/2015  . Metabolic syndrome 96/22/2979  . Eczema 05/07/2015  . Chronic insomnia 05/07/2015  . Hypertension, benign 05/07/2015  . Dyslipidemia 05/07/2015  . Hyperglycemia 05/07/2015  . Chronic low back pain 05/07/2015  . IBS (irritable bowel  syndrome) 05/07/2015  . Gastroesophageal reflux disease without esophagitis 05/07/2015  . Major depression, chronic ([place]) 05/07/2015  . Menopausal syndrome (hot flashes) 05/07/2015  . History of postmenopausal bleeding 01/14/2015    -May follow-up with [place] as needed for clinical annual breast exam.  Reminded her to continue her annual screening mammography.   Ronny Bacon M.D., FACS 01/23/2021, 9:20 AM

## 2021-02-10 ENCOUNTER — Other Ambulatory Visit: Payer: Self-pay | Admitting: Family Medicine

## 2021-02-10 DIAGNOSIS — K582 Mixed irritable bowel syndrome: Secondary | ICD-10-CM

## 2021-02-11 ENCOUNTER — Ambulatory Visit: Payer: Medicare Other | Admitting: Family Medicine

## 2021-02-11 DIAGNOSIS — M25552 Pain in left hip: Secondary | ICD-10-CM | POA: Diagnosis not present

## 2021-02-11 DIAGNOSIS — M170 Bilateral primary osteoarthritis of knee: Secondary | ICD-10-CM | POA: Diagnosis not present

## 2021-02-11 DIAGNOSIS — M25511 Pain in right shoulder: Secondary | ICD-10-CM | POA: Diagnosis not present

## 2021-02-11 DIAGNOSIS — M79661 Pain in right lower leg: Secondary | ICD-10-CM | POA: Diagnosis not present

## 2021-02-11 DIAGNOSIS — M79662 Pain in left lower leg: Secondary | ICD-10-CM | POA: Diagnosis not present

## 2021-02-11 DIAGNOSIS — M545 Low back pain, unspecified: Secondary | ICD-10-CM | POA: Diagnosis not present

## 2021-02-11 DIAGNOSIS — M25562 Pain in left knee: Secondary | ICD-10-CM | POA: Diagnosis not present

## 2021-02-11 DIAGNOSIS — G894 Chronic pain syndrome: Secondary | ICD-10-CM | POA: Diagnosis not present

## 2021-02-11 DIAGNOSIS — M25561 Pain in right knee: Secondary | ICD-10-CM | POA: Diagnosis not present

## 2021-02-11 DIAGNOSIS — M542 Cervicalgia: Secondary | ICD-10-CM | POA: Diagnosis not present

## 2021-02-11 DIAGNOSIS — M25551 Pain in right hip: Secondary | ICD-10-CM | POA: Diagnosis not present

## 2021-02-11 DIAGNOSIS — M25512 Pain in left shoulder: Secondary | ICD-10-CM | POA: Diagnosis not present

## 2021-02-12 NOTE — Progress Notes (Deleted)
Name: [name]   MRN: [id number]    DOB: 02-24-1961   Date:02/12/2021       Progress Note  Subjective  Chief Complaint  Follow Up  HPI  OA both knees:she has a TKA of left knee 04/2019 by Dr. Harlow Mares. She had TKR on right side June 16  th, 2021. She had one PT session and has been doing home exercises since. She went back for follow up and is doing well, she has mild soreness on right knee incision but doing better and has good rom   Metabolic syndrome:weight is stable since last visit. She is still taking Metformin. She denies polyphagia, polydipsia or polyuria. Continue life style modification.Last hgbA1C was normal. Trying to eat balanced meals   HTN:she has not been checking her bp at home lately. No chest pain or palpitation. BP today is at goal.Continue current regiment    Depression Major:long history of depression, got worse when mother died in 2017-01-15, but seems to be doing okay at this time, Still medications as prescribed, denies suicidal thoughts or ideation. Phq 9 is down from 11 to 3 and today is 2. She is compliant with medication   COPD:Shequit smoking 04/2019 with Chantix but was using to help with cravings, but it has been pulled from the market. She denies any wheezing, cough or SOB at this time.   IBS: doing well at this time, pain under control, she has diarrhea and constipation typeNo recent flares, doing well at this time . Taking Elavil   GERD: under control with medications  Hot flashes: severe , discussed HRT , discussed WHI, she is not willing to take it because of cost   Patient Active Problem List   Diagnosis Date Noted  . Status post left breast lumpectomy 01/23/2021  . History of total knee arthroplasty, right 04/17/2020  . S/P TKR (total knee replacement) using cement, left 04/12/2019  . Overweight (BMI 25.0-29.9) 04/06/2019  . Peptic ulcer   . Benign neoplasm of ascending colon   . Diverticulosis of large intestine without  diverticulitis   . Simple chronic bronchitis ([place]) 08/03/2018  . Pancreatic lesion 04/21/2018  . Abdominal aortic atherosclerosis ([place]) 04/21/2018  . Acute peptic ulcer of stomach   . Gastric polyp   . Abdominal pain, epigastric   . Trigger thumb of right hand 02/25/2018  . Carpal tunnel syndrome on both sides 10/21/2017  . CRP elevated 08/09/2017  . Elevated C-reactive protein 07/28/2017  . Osteoarthritis of both knees 02/19/2016  . COPD, mild ([place]) 05/08/2015  . Vitamin D deficiency 05/07/2015  . Metabolic syndrome 37/34/2876  . Eczema 05/07/2015  . Chronic insomnia 05/07/2015  . Hypertension, benign 05/07/2015  . Dyslipidemia 05/07/2015  . Hyperglycemia 05/07/2015  . Chronic low back pain 05/07/2015  . IBS (irritable bowel syndrome) 05/07/2015  . Gastroesophageal reflux disease without esophagitis 05/07/2015  . Major depression, chronic ([place]) 05/07/2015  . Menopausal syndrome (hot flashes) 05/07/2015  . History of postmenopausal bleeding 01/14/2015    Past Surgical History:  Procedure Laterality Date  . BACK SURGERY    . BREAST EXCISIONAL BIOPSY Left ?   neg  . BREAST LUMPECTOMY Left 01-15-21   Procedure: BREAST LUMPECTOMY;  Surgeon: Ronny Bacon, MD;  Location: [facility];  Service: General;  Laterality: Left;  . CARPAL TUNNEL RELEASE Left 08/15/2018   Procedure: CARPAL TUNNEL RELEASE;  Surgeon: Earnestine Leys, MD;  Location: [facility];  Service: Orthopedics;  Laterality: Left;  . CERVICAL DISCECTOMY     x  2; metal plate  . CHOLECYSTECTOMY    . COLONOSCOPY WITH PROPOFOL N/A 08/08/2018   Procedure: COLONOSCOPY WITH PROPOFOL;  Surgeon: Virgel Manifold, MD;  Location: [facility];  Service: Endoscopy;  Laterality: N/A;  . DILATATION & CURETTAGE/HYSTEROSCOPY WITH MYOSURE N/A 03/21/2015   Procedure: DILATATION & CURETTAGE/HYSTEROSCOPY;  Surgeon: Aletha Halim, MD;  Location: [facility];  Service: Gynecology;  Laterality: N/A;  . DILATION AND CURETTAGE OF UTERUS    .  ENDOMETRIAL ABLATION    . ESOPHAGOGASTRODUODENOSCOPY (EGD) WITH PROPOFOL N/A 04/13/2018   Procedure: ESOPHAGOGASTRODUODENOSCOPY (EGD) WITH PROPOFOL;  Surgeon: Virgel Manifold, MD;  Location: [facility];  Service: Endoscopy;  Laterality: N/A;  . ESOPHAGOGASTRODUODENOSCOPY (EGD) WITH PROPOFOL N/A 08/08/2018   Procedure: ESOPHAGOGASTRODUODENOSCOPY (EGD) WITH PROPOFOL;  Surgeon: Virgel Manifold, MD;  Location: [facility];  Service: Endoscopy;  Laterality: N/A;  . JOINT REPLACEMENT    . SPINAL FUSION     lumbar x2  . TONSILLECTOMY    . TOTAL KNEE ARTHROPLASTY Left 04/12/2019   Procedure: TOTAL KNEE ARTHROPLASTY;  Surgeon: Lovell Sheehan, MD;  Location: [facility];  Service: Orthopedics;  Laterality: Left;  . TOTAL KNEE ARTHROPLASTY Right 04/17/2020   Procedure: TOTAL KNEE ARTHROPLASTY;  Surgeon: Lovell Sheehan, MD;  Location: [facility];  Service: Orthopedics;  Laterality: Right;  . TUBAL LIGATION      Family History  Problem Relation Age of Onset  . Heart disease Mother   . Thyroid cancer Mother   . Congestive Heart Failure Mother   . Depression Daughter   . Asthma Daughter   . Breast cancer Maternal Grandmother     Social History   Tobacco Use  . Smoking status: Current Every Day Smoker    Packs/day: 1.00    Years: 20.00    Pack years: 20.00    Types: Cigarettes    Start date: 07/26/2002    Last attempt to quit: 04/06/2019    Years since quitting: 1.8  . Smokeless tobacco: Never Used  . Tobacco comment: Quit Date 04/06/2019; started again Nov 21  Substance Use Topics  . Alcohol use: No    Alcohol/week: 0.0 standard drinks     Current Outpatient Medications:  .  amitriptyline (ELAVIL) 10 MG tablet, TAKE ONE TABLET BY MOUTH AT BEDTIME FOR IBS, Disp: 90 tablet, Rfl: 1 .  amLODipine (NORVASC) 5 MG tablet, TAKE ONE TABLET BY MOUTH ONCE DAILY (Patient taking differently: Take 5 mg by mouth daily.), Disp: 90 tablet, Rfl: 1 .  atorvastatin (LIPITOR) 40 MG tablet, Take 1  tablet (40 mg total) by mouth daily., Disp: 90 tablet, Rfl: 1 .  cholecalciferol (VITAMIN D) 1000 units tablet, Take 1,000 Units by mouth daily., Disp: , Rfl:  .  desvenlafaxine (PRISTIQ) 50 MG 24 hr tablet, TAKE ONE TABLET BY MOUTH ONCE DAILY (Patient taking differently: Take 50 mg by mouth daily.), Disp: 90 tablet, Rfl: 1 .  gabapentin (NEURONTIN) 300 MG capsule, Take 300 mg by mouth 2 (two) times daily. , Disp: , Rfl:  .  losartan-hydrochlorothiazide (HYZAAR) 100-25 MG tablet, TAKE ONE TABLET BY MOUTH ONCE DAILY (Patient taking differently: Take 1 tablet by mouth daily.), Disp: 90 tablet, Rfl: 0 .  meloxicam (MOBIC) 15 MG tablet, Take 15 mg by mouth daily., Disp: , Rfl:  .  metFORMIN (GLUCOPHAGE) 500 MG tablet, Take 1 tablet (500 mg total) by mouth daily with breakfast., Disp: 90 tablet, Rfl: 1 .  Multiple Vitamins-Minerals (MULTIVITAMIN WITH MINERALS) tablet, Take 1 tablet by mouth every other  day., Disp: , Rfl:  .  NARCAN 4 MG/0.1ML LIQD nasal spray kit, Place 1 spray into the nose once., Disp: , Rfl:  .  omeprazole (PRILOSEC) 20 MG capsule, TAKE ONE CAPSULE BY MOUTH ONCE DAILY (Patient taking differently: Take 20 mg by mouth daily.), Disp: 90 capsule, Rfl: 1 .  Oxycodone HCl 20 MG TABS, Take 20 mg by mouth 4 (four) times daily., Disp: , Rfl:  .  QUEtiapine (SEROQUEL) 25 MG tablet, TAKE ONE TABLET BY MOUTH DAILY AT BEDTIME (Patient taking differently: Take 25 mg by mouth at bedtime.), Disp: 90 tablet, Rfl: 1 .  tiZANidine (ZANAFLEX) 4 MG tablet, Take 4 mg by mouth in the morning and at bedtime., Disp: , Rfl:  .  valACYclovir (VALTREX) 1000 MG tablet, Take 1 tablet (1,000 mg total) by mouth 2 (two) times daily. (Patient taking differently: Take 1,000 mg by mouth 2 (two) times daily as needed (breakouts).), Disp: 6 tablet, Rfl: 1 .  vitamin B-12 (CYANOCOBALAMIN) 1000 MCG tablet, Take 1,000 mcg by mouth daily., Disp: , Rfl:   No Known Allergies  I personally reviewed {Reviewed:14835} with the  patient/caregiver today.   ROS  ***  Objective  There were no vitals filed for this visit.  There is no height or weight on file to calculate BMI.  Physical Exam ***  Recent Results (from the past 2160 hour(s))  CBC with Differential     Status: Abnormal   Collection Time: 01/01/21  2:18 AM  Result Value Ref Range   WBC 14.0 (H) 4.0 - 10.5 K/uL   RBC 4.04 3.87 - 5.11 MIL/uL   Hemoglobin 12.2 12.0 - 15.0 g/dL   HCT 37.7 36.0 - 46.0 %   MCV 93.3 80.0 - 100.0 fL   MCH 30.2 26.0 - 34.0 pg   MCHC 32.4 30.0 - 36.0 g/dL   RDW 13.0 11.5 - 15.5 %   Platelets 249 150 - 400 K/uL   nRBC 0.0 0.0 - 0.2 %   Neutrophils Relative % 76 %   Neutro Abs 10.6 (H) 1.7 - 7.7 K/uL   Lymphocytes Relative 15 %   Lymphs Abs 2.2 0.7 - 4.0 K/uL   Monocytes Relative 7 %   Monocytes Absolute 1.0 0.1 - 1.0 K/uL   Eosinophils Relative 1 %   Eosinophils Absolute 0.1 0.0 - 0.5 K/uL   Basophils Relative 0 %   Basophils Absolute 0.0 0.0 - 0.1 K/uL   Immature Granulocytes 1 %   Abs Immature Granulocytes 0.08 (H) 0.00 - 0.07 K/uL    Comment: Performed at [facility], [address] [place], [place] [address]  Basic metabolic panel     Status: Abnormal   Collection Time: 01/01/21  2:18 AM  Result Value Ref Range   Sodium 137 135 - 145 mmol/L   Potassium 3.1 (L) 3.5 - 5.1 mmol/L   Chloride 101 98 - 111 mmol/L   CO2 25 22 - 32 mmol/L   Glucose, Bld 117 (H) 70 - 99 mg/dL    Comment: Glucose reference range applies only to samples taken after fasting for at least 8 hours.   BUN 10 6 - 20 mg/dL   Creatinine, Ser 0.63 0.44 - 1.00 mg/dL   Calcium 9.6 8.9 - 10.3 mg/dL   GFR, Estimated >60 >60 mL/min    Comment: (NOTE) Calculated using the CKD-EPI Creatinine Equation (2021)    Anion gap 11 5 - 15    Comment: Performed at [facility], [address] [place], [place] [address]  Urinalysis, Complete w Microscopic     Status: Abnormal   Collection Time: 01/01/21  2:18 AM  Result  Value Ref Range   Color, Urine STRAW (A) YELLOW   APPearance CLEAR (A) CLEAR   Specific Gravity, Urine 1.002 (L) 1.005 - 1.030   pH 6.0 5.0 - 8.0   Glucose, UA NEGATIVE NEGATIVE mg/dL   Hgb urine dipstick NEGATIVE NEGATIVE   Bilirubin Urine NEGATIVE NEGATIVE   Ketones, ur NEGATIVE NEGATIVE mg/dL   Protein, ur NEGATIVE NEGATIVE mg/dL   Nitrite NEGATIVE NEGATIVE   Leukocytes,Ua NEGATIVE NEGATIVE   WBC, UA NONE SEEN 0 - 5 WBC/hpf   Bacteria, UA NONE SEEN NONE SEEN   Squamous Epithelial / LPF 0-5 0 - 5    Comment: Performed at [facility], [address] [place], [place] [address]  Group A Strep by PCR     Status: Abnormal   Collection Time: 01/01/21  2:18 AM   Specimen: Throat; Sterile Swab  Result Value Ref Range   Group A Strep by PCR DETECTED (A) NOT DETECTED    Comment: Performed at [facility], [address] [place], [place] [address]  CBC     Status: Abnormal   Collection Time: 01/06/21  2:58 PM  Result Value Ref Range   WBC 11.1 (H) 4.0 - 10.5 K/uL   RBC 3.90 3.87 - 5.11 MIL/uL   Hemoglobin 11.9 (L) 12.0 - 15.0 g/dL   HCT 36.6 36.0 - 46.0 %   MCV 93.8 80.0 - 100.0 fL   MCH 30.5 26.0 - 34.0 pg   MCHC 32.5 30.0 - 36.0 g/dL   RDW 13.0 11.5 - 15.5 %   Platelets 350 150 - 400 K/uL   nRBC 0.0 0.0 - 0.2 %    Comment: Performed at [facility], [address] [place], [place] [address]  Basic metabolic panel     Status: None   Collection Time: 01/06/21  2:58 PM  Result Value Ref Range   Sodium 137 135 - 145 mmol/L   Potassium 3.6 3.5 - 5.1 mmol/L   Chloride 102 98 - 111 mmol/L   CO2 27 22 - 32 mmol/L   Glucose, Bld 77 70 - 99 mg/dL    Comment: Glucose reference range applies only to samples taken after fasting for at least 8 hours.   BUN 13 6 - 20 mg/dL   Creatinine, Ser 0.60 0.44 - 1.00 mg/dL   Calcium 9.0 8.9 - 10.3 mg/dL   GFR, Estimated >60 >60 mL/min    Comment: (NOTE) Calculated using the CKD-EPI Creatinine Equation (2021)     Anion gap 8 5 - 15    Comment: Performed at [facility], [place], [place] [address]  SARS CORONAVIRUS 2 (TAT 6-24 HRS) Nasopharyngeal Nasopharyngeal Swab     Status: None   Collection Time: 01/06/21  3:25 PM   Specimen: Nasopharyngeal Swab  Result Value Ref Range   SARS Coronavirus 2 NEGATIVE NEGATIVE    Comment: (NOTE) SARS-CoV-2 target nucleic acids are NOT DETECTED.  The SARS-CoV-2 RNA is generally detectable in upper and lower respiratory specimens during the acute phase of infection. Negative results do not preclude SARS-CoV-2 infection, do not rule out co-infections with other pathogens, and should not be used as the sole basis for treatment or other patient management decisions. Negative results must be combined with clinical observations, patient history, and epidemiological information. The expected result is Negative.  Fact  Sheet for Patients: SugarRoll.be  Fact Sheet for Healthcare Providers: https://www.woods-mathews.com/  This test is not yet approved or cleared by the [place] FDA and  has been authorized for detection and/or diagnosis of SARS-CoV-2 by FDA under an Emergency Use Authorization (EUA). This EUA will remain  in effect (meaning this test can be used) for the duration of the COVID-19 declaration under Se ction 564(b)(1) of the Act, 21 U.S.C. section 360bbb-3(b)(1), unless the authorization is terminated or revoked sooner.  Performed at [facility], [place] [address], [place], [place] [address]   Surgical pathology     Status: None   Collection Time: 01/08/21  7:55 AM  Result Value Ref Range   SURGICAL PATHOLOGY      SURGICAL PATHOLOGY CASE: [id number] PATIENT: Chana Bode Surgical Pathology Report     Specimen Submitted: A. Lipoma, left breast  Clinical History: Left breast mass      DIAGNOSIS: A. LIPOMA, LEFT BREAST; EXCISION: - MATURE ADIPOSE TISSUE  COMPATIBLE WITH CLINICAL IMPRESSION OF LIPOMA. - NEGATIVE FOR MALIGNANCY.  GROSS DESCRIPTION: A. Labeled: Left breast lipoma Received: Formalin Specimen radiograph image(s) available for review Radiographic findings: None provided Time in fixative: Collected at 8:02 AM on 01/08/2021 and placed in formalin at 8:05 AM on 01/08/2021 Cold ischemic time: Less than 5 minutes Total fixation time: 9 hours Type of procedure: Breast lumpectomy Location / laterality of specimen: Left breast Orientation of specimen: The specimen is received in multiple unoriented fragments. Inking: The largest fragments are entirely differentially inked. Size of specimen: Aggregate, 5.7 x 4.7 x 3 cm Skin: No distinct skin is grossly apprecia ted.  Biopsy site: None grossly appreciated.  Number of discrete masses: None grossly appreciated. Description of tissue: Received are multiple irregular fragments of yellow lobulated adipose tissue.  There are scattered areas of intervening fibrous tissue.  No distinct masses or lesions are grossly appreciated.  The fat to fibrous tissue ratio is 95:5.  Block summary: 1 - 6 - representative sections of adipose tissue (1/cm)  RB 01/08/2021   Final Diagnosis performed by Quay Burow, MD.   Electronically signed 01/09/2021 3:45:07PM The electronic signature indicates that the named Attending Pathologist has evaluated the specimen Technical component performed at [facility], [address], [place], [place] [address] Lab: [phone] Dir: Rush Farmer, MD, MMM  Professional component performed at [facility], [facility], [place], [place], [place] [address] Lab: [phone] Dir: Dellia Nims. Rubinas, MD     Diabetic Foot Exam: Diabetic Foot Exam - Simple   No data filed    ***  PHQ2/9: Depression screen [facility] 2/9 11/27/2020 11/05/2020 08/07/2020 02/06/2020 12/01/2019  Decreased Interest 1 0 0 0 2  Down, Depressed, Hopeless 1 0 1 1 0  PHQ - 2 Score 2 0 $R'1  1 2  'XI$ Altered sleeping 1 - 0 1 3  Tired, decreased energy 1 - 0 1 1  Change in appetite 0 - 0 0 3  Feeling bad or failure about yourself  0 - 1 0 2  Trouble concentrating 0 - 0 0 0  Moving slowly or fidgety/restless 0 - 0 0 0  Suicidal thoughts 0 - 0 0 0  PHQ-9 Score 4 - $R'2 3 11  'ev$ Difficult doing work/chores - - Not difficult at all Not difficult at all Not difficult at all  Some recent data might be hidden    phq 9 is {gen pos [id number]} ***  Fall Risk: Fall Risk  12/12/2020 11/27/2020 11/05/2020 08/07/2020 02/06/2020  Falls in the past year? 0 0 0 0 1  Number falls in past yr: - 0 0 0 0  Injury with Fall? - 0 0 0 0  Risk for fall due to : - - Orthopedic patient - -  Risk for fall due to: Comment - - - - -  Follow up - - Falls prevention discussed - -   ***   Functional Status Survey:   ***   Assessment & Plan  *** There are no diagnoses linked to this encounter.

## 2021-02-13 ENCOUNTER — Ambulatory Visit: Payer: Medicare Other | Admitting: Family Medicine

## 2021-02-19 ENCOUNTER — Other Ambulatory Visit: Payer: Self-pay | Admitting: Family Medicine

## 2021-02-19 DIAGNOSIS — B001 Herpesviral vesicular dermatitis: Secondary | ICD-10-CM

## 2021-02-19 NOTE — Telephone Encounter (Signed)
Requested Prescriptions  Pending Prescriptions Disp Refills  . valACYclovir (VALTREX) 1000 MG tablet [Pharmacy Med Name: valacyclovir 1 gram tablet] 6 tablet 1    Sig: TAKE ONE TABLET BY MOUTH TWICE DAILY     Antimicrobials:  Antiviral Agents - Anti-Herpetic Passed - 02/19/2021  4:56 PM      Passed - Valid encounter within last 12 months    Recent Outpatient Visits          2 months ago Lipoma of anterior chest wall   [facility] Steele Sizer, MD   6 months ago COPD, mild [facility])   [facility] Steele Sizer, MD   1 year ago Major depression, recurrent, chronic [facility])   [facility] Steele Sizer, MD   1 year ago Fever blister   [facility] Steele Sizer, MD   1 year ago Anemia, unspecified type   [facility] Steele Sizer, MD      Future Appointments            In 8 months [facility], [facility]

## 2021-03-14 DIAGNOSIS — M170 Bilateral primary osteoarthritis of knee: Secondary | ICD-10-CM | POA: Diagnosis not present

## 2021-03-14 DIAGNOSIS — M79662 Pain in left lower leg: Secondary | ICD-10-CM | POA: Diagnosis not present

## 2021-03-14 DIAGNOSIS — M25562 Pain in left knee: Secondary | ICD-10-CM | POA: Diagnosis not present

## 2021-03-14 DIAGNOSIS — M25512 Pain in left shoulder: Secondary | ICD-10-CM | POA: Diagnosis not present

## 2021-03-14 DIAGNOSIS — M25561 Pain in right knee: Secondary | ICD-10-CM | POA: Diagnosis not present

## 2021-03-14 DIAGNOSIS — M25552 Pain in left hip: Secondary | ICD-10-CM | POA: Diagnosis not present

## 2021-03-14 DIAGNOSIS — M545 Low back pain, unspecified: Secondary | ICD-10-CM | POA: Diagnosis not present

## 2021-03-14 DIAGNOSIS — M25551 Pain in right hip: Secondary | ICD-10-CM | POA: Diagnosis not present

## 2021-03-14 DIAGNOSIS — M542 Cervicalgia: Secondary | ICD-10-CM | POA: Diagnosis not present

## 2021-03-14 DIAGNOSIS — M79661 Pain in right lower leg: Secondary | ICD-10-CM | POA: Diagnosis not present

## 2021-03-14 DIAGNOSIS — G894 Chronic pain syndrome: Secondary | ICD-10-CM | POA: Diagnosis not present

## 2021-03-14 DIAGNOSIS — M25511 Pain in right shoulder: Secondary | ICD-10-CM | POA: Diagnosis not present

## 2021-03-24 DIAGNOSIS — H16223 Keratoconjunctivitis sicca, not specified as Sjogren's, bilateral: Secondary | ICD-10-CM | POA: Diagnosis not present

## 2021-04-02 ENCOUNTER — Other Ambulatory Visit: Payer: Self-pay | Admitting: Family Medicine

## 2021-04-02 DIAGNOSIS — E8881 Metabolic syndrome: Secondary | ICD-10-CM

## 2021-04-14 DIAGNOSIS — M25551 Pain in right hip: Secondary | ICD-10-CM | POA: Diagnosis not present

## 2021-04-14 DIAGNOSIS — M542 Cervicalgia: Secondary | ICD-10-CM | POA: Diagnosis not present

## 2021-04-14 DIAGNOSIS — M79661 Pain in right lower leg: Secondary | ICD-10-CM | POA: Diagnosis not present

## 2021-04-14 DIAGNOSIS — M25552 Pain in left hip: Secondary | ICD-10-CM | POA: Diagnosis not present

## 2021-04-14 DIAGNOSIS — M25511 Pain in right shoulder: Secondary | ICD-10-CM | POA: Diagnosis not present

## 2021-04-14 DIAGNOSIS — M25512 Pain in left shoulder: Secondary | ICD-10-CM | POA: Diagnosis not present

## 2021-04-14 DIAGNOSIS — M170 Bilateral primary osteoarthritis of knee: Secondary | ICD-10-CM | POA: Diagnosis not present

## 2021-04-14 DIAGNOSIS — M545 Low back pain, unspecified: Secondary | ICD-10-CM | POA: Diagnosis not present

## 2021-04-14 DIAGNOSIS — M25562 Pain in left knee: Secondary | ICD-10-CM | POA: Diagnosis not present

## 2021-04-14 DIAGNOSIS — M25561 Pain in right knee: Secondary | ICD-10-CM | POA: Diagnosis not present

## 2021-04-14 DIAGNOSIS — G894 Chronic pain syndrome: Secondary | ICD-10-CM | POA: Diagnosis not present

## 2021-04-14 DIAGNOSIS — M79662 Pain in left lower leg: Secondary | ICD-10-CM | POA: Diagnosis not present

## 2021-05-02 ENCOUNTER — Other Ambulatory Visit: Payer: Self-pay | Admitting: Family Medicine

## 2021-05-02 DIAGNOSIS — B001 Herpesviral vesicular dermatitis: Secondary | ICD-10-CM

## 2021-05-02 DIAGNOSIS — I10 Essential (primary) hypertension: Secondary | ICD-10-CM

## 2021-05-12 DIAGNOSIS — M25512 Pain in left shoulder: Secondary | ICD-10-CM | POA: Diagnosis not present

## 2021-05-12 DIAGNOSIS — M170 Bilateral primary osteoarthritis of knee: Secondary | ICD-10-CM | POA: Diagnosis not present

## 2021-05-12 DIAGNOSIS — G894 Chronic pain syndrome: Secondary | ICD-10-CM | POA: Diagnosis not present

## 2021-05-12 DIAGNOSIS — M79661 Pain in right lower leg: Secondary | ICD-10-CM | POA: Diagnosis not present

## 2021-05-12 DIAGNOSIS — M542 Cervicalgia: Secondary | ICD-10-CM | POA: Diagnosis not present

## 2021-05-12 DIAGNOSIS — M545 Low back pain, unspecified: Secondary | ICD-10-CM | POA: Diagnosis not present

## 2021-05-12 DIAGNOSIS — M25511 Pain in right shoulder: Secondary | ICD-10-CM | POA: Diagnosis not present

## 2021-05-12 DIAGNOSIS — M25561 Pain in right knee: Secondary | ICD-10-CM | POA: Diagnosis not present

## 2021-05-12 DIAGNOSIS — M79662 Pain in left lower leg: Secondary | ICD-10-CM | POA: Diagnosis not present

## 2021-05-12 DIAGNOSIS — M25562 Pain in left knee: Secondary | ICD-10-CM | POA: Diagnosis not present

## 2021-05-12 DIAGNOSIS — M25552 Pain in left hip: Secondary | ICD-10-CM | POA: Diagnosis not present

## 2021-05-12 DIAGNOSIS — M25551 Pain in right hip: Secondary | ICD-10-CM | POA: Diagnosis not present

## 2021-05-13 DIAGNOSIS — M75121 Complete rotator cuff tear or rupture of right shoulder, not specified as traumatic: Secondary | ICD-10-CM | POA: Diagnosis not present

## 2021-05-13 DIAGNOSIS — M5440 Lumbago with sciatica, unspecified side: Secondary | ICD-10-CM | POA: Diagnosis not present

## 2021-05-21 ENCOUNTER — Other Ambulatory Visit: Payer: Self-pay | Admitting: Family Medicine

## 2021-05-21 DIAGNOSIS — F339 Major depressive disorder, recurrent, unspecified: Secondary | ICD-10-CM

## 2021-05-21 DIAGNOSIS — F5104 Psychophysiologic insomnia: Secondary | ICD-10-CM

## 2021-06-03 NOTE — Progress Notes (Signed)
Name: [name]   MRN: [id number]    DOB: May 08, 1961   Date:06/04/2021       Progress Note  Subjective  Chief Complaint  Follow Up  HPI  OA both knees: she has a TKA of left knee 04/2019 by Dr. Odis Luster. She had TKR on right side June 16  th, 2021. , taking meloxicam, advised to stop and take Tylenol only    Metabolic syndrome: weigh has gone down 5 lbs. She is still taking Metformin.  She denies polyphagia, polydipsia or polyuria.  Continue life style modification. Last hgbA1C was normal. We will recheck labs next visit    HTN: bp today is at goal, no chest pain, palpitation or dizziness.   Depression Major: long history of depression, got worse when mother died in 2017-04-01, but seems to be doing okay at this time, Still medications as prescribed, denies suicidal thoughts or ideation. Phq 9 is down from 11 to 3 a, 2 to zero. She is compliant with medication    COPD: She quit smoking 04/2019   She denies any wheezing or SOB at this time. She has intermittent nocturnal cough . She is due for pneumonia vaccine    IBS: she has been out of nortriptyline , she still has intermittent diarrhea and constipation.    GERD: under control with medications, advised to stop nsaid's   Hot flashes: severe , discussed HRT , discussed WHI, she is not willing to take it because of cost  Atherosclerosis of aorta: resume statin therapy and recheck labs next visit   Spots on legs, she states noticed white dots on her legs, explained likely aging spots but she would like to see dermatologist   Patient Active Problem List   Diagnosis Date Noted   Status post left breast lumpectomy 01/23/2021   History of total knee arthroplasty, right 04/17/2020   S/P TKR (total knee replacement) using cement, left 04/12/2019   Overweight (BMI 25.0-29.9) 04/06/2019   Peptic ulcer    Benign neoplasm of ascending colon    Diverticulosis of large intestine without diverticulitis    Simple chronic bronchitis (HCC)  08/03/2018   Pancreatic lesion 04/21/2018   Abdominal aortic atherosclerosis (HCC) 04/21/2018   Acute peptic ulcer of stomach    Gastric polyp    Abdominal pain, epigastric    Trigger thumb of right hand 02/25/2018   Carpal tunnel syndrome on both sides 10/21/2017   CRP elevated 08/09/2017   Elevated C-reactive protein 07/28/2017   Osteoarthritis of both knees 02/19/2016   COPD, mild (HCC) 05/08/2015   Vitamin D deficiency 05/07/2015   Metabolic syndrome 05/07/2015   Eczema 05/07/2015   Chronic insomnia 05/07/2015   Hypertension, benign 05/07/2015   Dyslipidemia 05/07/2015   Hyperglycemia 05/07/2015   Chronic low back pain 05/07/2015   IBS (irritable bowel syndrome) 05/07/2015   Gastroesophageal reflux disease without esophagitis 05/07/2015   Major depression, chronic (HCC) 05/07/2015   Menopausal syndrome (hot flashes) 05/07/2015   History of postmenopausal bleeding 01/14/2015    Past Surgical History:  Procedure Laterality Date   BACK SURGERY     BREAST EXCISIONAL BIOPSY Left ?   neg   BREAST LUMPECTOMY Left 01/08/2021   Procedure: BREAST LUMPECTOMY;  Surgeon: Campbell Lerner, MD;  Location: [facility];  Service: General;  Laterality: Left;   CARPAL TUNNEL RELEASE Left 08/15/2018   Procedure: CARPAL TUNNEL RELEASE;  Surgeon: Deeann Saint, MD;  Location: [facility];  Service: Orthopedics;  Laterality: Left;   CERVICAL DISCECTOMY  x 2; metal plate   CHOLECYSTECTOMY     COLONOSCOPY WITH PROPOFOL N/A 08/08/2018   Procedure: COLONOSCOPY WITH PROPOFOL;  Surgeon: Virgel Manifold, MD;  Location: [facility];  Service: Endoscopy;  Laterality: N/A;   DILATATION & CURETTAGE/HYSTEROSCOPY WITH MYOSURE N/A 03/21/2015   Procedure: DILATATION & CURETTAGE/HYSTEROSCOPY;  Surgeon: Aletha Halim, MD;  Location: [facility];  Service: Gynecology;  Laterality: N/A;   DILATION AND CURETTAGE OF UTERUS     ENDOMETRIAL ABLATION     ESOPHAGOGASTRODUODENOSCOPY (EGD) WITH PROPOFOL N/A  04/13/2018   Procedure: ESOPHAGOGASTRODUODENOSCOPY (EGD) WITH PROPOFOL;  Surgeon: Virgel Manifold, MD;  Location: [facility];  Service: Endoscopy;  Laterality: N/A;   ESOPHAGOGASTRODUODENOSCOPY (EGD) WITH PROPOFOL N/A 08/08/2018   Procedure: ESOPHAGOGASTRODUODENOSCOPY (EGD) WITH PROPOFOL;  Surgeon: Virgel Manifold, MD;  Location: [facility];  Service: Endoscopy;  Laterality: N/A;   JOINT REPLACEMENT     SPINAL FUSION     lumbar x2   TONSILLECTOMY     TOTAL KNEE ARTHROPLASTY Left 04/12/2019   Procedure: TOTAL KNEE ARTHROPLASTY;  Surgeon: Lovell Sheehan, MD;  Location: [facility];  Service: Orthopedics;  Laterality: Left;   TOTAL KNEE ARTHROPLASTY Right 04/17/2020   Procedure: TOTAL KNEE ARTHROPLASTY;  Surgeon: Lovell Sheehan, MD;  Location: [facility];  Service: Orthopedics;  Laterality: Right;   TUBAL LIGATION      Family History  Problem Relation Age of Onset   Heart disease Mother    Thyroid cancer Mother    Congestive Heart Failure Mother    Depression Daughter    Asthma Daughter    Breast cancer Maternal Grandmother     Social History   Tobacco Use   Smoking status: Every Day    Packs/day: 1.00    Years: 20.00    Pack years: 20.00    Types: Cigarettes    Start date: 07/26/2002    Last attempt to quit: 04/06/2019    Years since quitting: 2.1   Smokeless tobacco: Never   Tobacco comments:    Quit Date 04/06/2019; started again Nov 21  Substance Use Topics   Alcohol use: No    Alcohol/week: 0.0 standard drinks     Current Outpatient Medications:    cholecalciferol (VITAMIN D) 1000 units tablet, Take 1,000 Units by mouth daily., Disp: , Rfl:    gabapentin (NEURONTIN) 300 MG capsule, Take 300 mg by mouth 2 (two) times daily. , Disp: , Rfl:    meloxicam (MOBIC) 15 MG tablet, Take 15 mg by mouth daily., Disp: , Rfl:    Multiple Vitamins-Minerals (MULTIVITAMIN WITH MINERALS) tablet, Take 1 tablet by mouth every other day., Disp: , Rfl:    NARCAN 4 MG/0.1ML LIQD  nasal spray kit, Place 1 spray into the nose once., Disp: , Rfl:    Oxycodone HCl 20 MG TABS, Take 20 mg by mouth 4 (four) times daily., Disp: , Rfl:    tiZANidine (ZANAFLEX) 4 MG tablet, Take 4 mg by mouth in the morning and at bedtime., Disp: , Rfl:    valACYclovir (VALTREX) 1000 MG tablet, TAKE ONE TABLET BY MOUTH TWICE DAILY, Disp: 6 tablet, Rfl: 1   vitamin B-12 (CYANOCOBALAMIN) 1000 MCG tablet, Take 1,000 mcg by mouth daily., Disp: , Rfl:    Zoster Vaccine Adjuvanted (SHINGRIX) injection, Inject 0.5 mLs into the muscle once for 1 dose., Disp: 0.5 mL, Rfl: 1   amitriptyline (ELAVIL) 10 MG tablet, TAKE ONE TABLET BY MOUTH AT BEDTIME FOR IBS, Disp: 90 tablet, Rfl: 1   amLODipine (  NORVASC) 5 MG tablet, Take 1 tablet (5 mg total) by mouth daily., Disp: 90 tablet, Rfl: 1   atorvastatin (LIPITOR) 40 MG tablet, Take 1 tablet (40 mg total) by mouth daily., Disp: 90 tablet, Rfl: 1   desvenlafaxine (PRISTIQ) 50 MG 24 hr tablet, Take 1 tablet (50 mg total) by mouth daily., Disp: 90 tablet, Rfl: 1   losartan-hydrochlorothiazide (HYZAAR) 100-25 MG tablet, Take 1 tablet by mouth daily., Disp: 90 tablet, Rfl: 1   metFORMIN (GLUCOPHAGE) 500 MG tablet, Take 1 tablet (500 mg total) by mouth daily with breakfast., Disp: 90 tablet, Rfl: 1   omeprazole (PRILOSEC) 20 MG capsule, Take 1 capsule (20 mg total) by mouth daily., Disp: 90 capsule, Rfl: 1   QUEtiapine (SEROQUEL) 25 MG tablet, Take 1 tablet (25 mg total) by mouth at bedtime., Disp: 90 tablet, Rfl: 1  No Known Allergies  I personally reviewed active problem list, medication list, allergies, family history, social history, health maintenance with the patient/caregiver today.   ROS  Constitutional: Negative for fever, positive for mild  weight change.  Respiratory: Negative for cough and shortness of breath.   Cardiovascular: Negative for chest pain or palpitations.  Gastrointestinal: Negative for abdominal pain, no bowel changes.  Musculoskeletal:  Negative for gait problem or joint swelling.  Skin: Negative for rash.  Neurological: Negative for dizziness or headache.  No other specific complaints in a complete review of systems (except as listed in HPI above).   Objective  Vitals:   06/04/21 1442  BP: 132/70  Pulse: 86  Resp: 16  Temp: 98.3 F (36.8 C)  SpO2: 100%  Weight: 205 lb (93 kg)  Height: $Remove'5\' 8"'Vycoxvk$  (1.727 m)    Body mass index is 31.17 kg/m.  Physical Exam  Constitutional: Patient appears well-developed and well-nourished. Obese  No distress.  HEENT: head atraumatic, normocephalic, pupils equal and reactive to light, neck supple Cardiovascular: Normal rate, regular rhythm and normal heart sounds.  No murmur heard. No BLE edema. Pulmonary/Chest: Effort normal and breath sounds normal. No respiratory distress. Abdominal: Soft.  There is no tenderness. Psychiatric: Patient has a normal mood and affect. behavior is normal. Judgment and thought content normal.  Muscular skeletal: scar from total knee replacement   PHQ2/9: Depression screen [facility] 2/9 06/04/2021 11/27/2020 11/05/2020 08/07/2020 02/06/2020  Decreased Interest 0 1 0 0 0  Down, Depressed, Hopeless 0 1 0 1 1  PHQ - 2 Score 0 2 0 1 1  Altered sleeping 0 1 - 0 1  Tired, decreased energy 0 1 - 0 1  Change in appetite 0 0 - 0 0  Feeling bad or failure about yourself  0 0 - 1 0  Trouble concentrating 0 0 - 0 0  Moving slowly or fidgety/restless 0 0 - 0 0  Suicidal thoughts 0 0 - 0 0  PHQ-9 Score 0 4 - 2 3  Difficult doing work/chores - - - Not difficult at all Not difficult at all  Some recent data might be hidden    phq 9 is negative   Fall Risk: Fall Risk  06/04/2021 12/12/2020 11/27/2020 11/05/2020 08/07/2020  Falls in the past year? 1 0 0 0 0  Number falls in past yr: 0 - 0 0 0  Injury with Fall? 0 - 0 0 0  Risk for fall due to : - - - Orthopedic patient -  Risk for fall due to: Comment - - - - -  Follow up - - - Falls prevention discussed -  Functional  Status Survey: Is the patient deaf or have difficulty hearing?: No Does the patient have difficulty seeing, even when wearing glasses/contacts?: No Does the patient have difficulty concentrating, remembering, or making decisions?: No Does the patient have difficulty walking or climbing stairs?: Yes Does the patient have difficulty dressing or bathing?: No Does the patient have difficulty doing errands alone such as visiting a doctor's office or shopping?: No    Assessment & Plan  1. Hypertension, benign  - amLODipine (NORVASC) 5 MG tablet; Take 1 tablet (5 mg total) by mouth daily.  Dispense: 90 tablet; Refill: 1 - losartan-hydrochlorothiazide (HYZAAR) 100-25 MG tablet; Take 1 tablet by mouth daily.  Dispense: 90 tablet; Refill: 1  2. Irritable bowel syndrome with both constipation and diarrhea  - amitriptyline (ELAVIL) 10 MG tablet; TAKE ONE TABLET BY MOUTH AT BEDTIME FOR IBS  Dispense: 90 tablet; Refill: 1  3. Abdominal aortic atherosclerosis (HCC)  - atorvastatin (LIPITOR) 40 MG tablet; Take 1 tablet (40 mg total) by mouth daily.  Dispense: 90 tablet; Refill: 1  4. Major depression, recurrent, chronic (HCC)  - desvenlafaxine (PRISTIQ) 50 MG 24 hr tablet; Take 1 tablet (50 mg total) by mouth daily.  Dispense: 90 tablet; Refill: 1 - QUEtiapine (SEROQUEL) 25 MG tablet; Take 1 tablet (25 mg total) by mouth at bedtime.  Dispense: 90 tablet; Refill: 1  5. Insulin resistance  - metFORMIN (GLUCOPHAGE) 500 MG tablet; Take 1 tablet (500 mg total) by mouth daily with breakfast.  Dispense: 90 tablet; Refill: 1  6. Metabolic syndrome  - metFORMIN (GLUCOPHAGE) 500 MG tablet; Take 1 tablet (500 mg total) by mouth daily with breakfast.  Dispense: 90 tablet; Refill: 1  7. Chronic insomnia  - QUEtiapine (SEROQUEL) 25 MG tablet; Take 1 tablet (25 mg total) by mouth at bedtime.  Dispense: 90 tablet; Refill: 1  8. Need for shingles vaccine  - Zoster Vaccine Adjuvanted [facility]) injection;  Inject 0.5 mLs into the muscle once for 1 dose.  Dispense: 0.5 mL; Refill: 1  9. COPD, mild ([place])   10. Need for pneumococcal vaccine  - Pneumococcal conjugate vaccine 20-valent (Prevnar 20)  11. Gastroesophageal reflux disease without esophagitis  - omeprazole (PRILOSEC) 20 MG capsule; Take 1 capsule (20 mg total) by mouth daily.  Dispense: 90 capsule; Refill: 1  12. Skin lesion  - Ambulatory referral to Dermatology

## 2021-06-04 ENCOUNTER — Ambulatory Visit (INDEPENDENT_AMBULATORY_CARE_PROVIDER_SITE_OTHER): Payer: Medicare Other | Admitting: Family Medicine

## 2021-06-04 ENCOUNTER — Other Ambulatory Visit: Payer: Self-pay

## 2021-06-04 ENCOUNTER — Encounter: Payer: Self-pay | Admitting: Family Medicine

## 2021-06-04 VITALS — BP 132/70 | HR 86 | Temp 98.3°F | Resp 16 | Ht 68.0 in | Wt 205.0 lb

## 2021-06-04 DIAGNOSIS — E8881 Metabolic syndrome: Secondary | ICD-10-CM

## 2021-06-04 DIAGNOSIS — K219 Gastro-esophageal reflux disease without esophagitis: Secondary | ICD-10-CM

## 2021-06-04 DIAGNOSIS — I700 Atherosclerosis of aorta: Secondary | ICD-10-CM | POA: Diagnosis not present

## 2021-06-04 DIAGNOSIS — L989 Disorder of the skin and subcutaneous tissue, unspecified: Secondary | ICD-10-CM | POA: Diagnosis not present

## 2021-06-04 DIAGNOSIS — K582 Mixed irritable bowel syndrome: Secondary | ICD-10-CM

## 2021-06-04 DIAGNOSIS — Z23 Encounter for immunization: Secondary | ICD-10-CM

## 2021-06-04 DIAGNOSIS — F339 Major depressive disorder, recurrent, unspecified: Secondary | ICD-10-CM | POA: Diagnosis not present

## 2021-06-04 DIAGNOSIS — F5104 Psychophysiologic insomnia: Secondary | ICD-10-CM

## 2021-06-04 DIAGNOSIS — I10 Essential (primary) hypertension: Secondary | ICD-10-CM

## 2021-06-04 DIAGNOSIS — E88819 Insulin resistance, unspecified: Secondary | ICD-10-CM

## 2021-06-04 DIAGNOSIS — J449 Chronic obstructive pulmonary disease, unspecified: Secondary | ICD-10-CM

## 2021-06-04 MED ORDER — LOSARTAN POTASSIUM-HCTZ 100-25 MG PO TABS: 1.0000 | ORAL_TABLET | Freq: Every day | ORAL | 1 refills | Status: DC

## 2021-06-04 MED ORDER — ATORVASTATIN CALCIUM 40 MG PO TABS: 40.0000 mg | ORAL_TABLET | Freq: Every day | ORAL | 1 refills | Status: DC

## 2021-06-04 MED ORDER — AMLODIPINE BESYLATE 5 MG PO TABS: 5.0000 mg | ORAL_TABLET | Freq: Every day | ORAL | 1 refills | Status: DC

## 2021-06-04 MED ORDER — OMEPRAZOLE 20 MG PO CPDR: 20.0000 mg | DELAYED_RELEASE_CAPSULE | Freq: Every day | ORAL | 1 refills | Status: DC

## 2021-06-04 MED ORDER — AMITRIPTYLINE HCL 10 MG PO TABS: ORAL_TABLET | ORAL | 1 refills | Status: DC

## 2021-06-04 MED ORDER — DESVENLAFAXINE SUCCINATE ER 50 MG PO TB24: 50.0000 mg | ORAL_TABLET | Freq: Every day | ORAL | 1 refills | Status: DC

## 2021-06-04 MED ORDER — METFORMIN HCL 500 MG PO TABS: 500.0000 mg | ORAL_TABLET | Freq: Every day | ORAL | 1 refills | Status: DC

## 2021-06-04 MED ORDER — QUETIAPINE FUMARATE 25 MG PO TABS: 25.0000 mg | ORAL_TABLET | Freq: Every day | ORAL | 1 refills | Status: DC

## 2021-06-04 MED ORDER — SHINGRIX 50 MCG/0.5ML IM SUSR: 0.5000 mL | Freq: Once | INTRAMUSCULAR | 1 refills | Status: AC

## 2021-06-09 DIAGNOSIS — M25572 Pain in left ankle and joints of left foot: Secondary | ICD-10-CM | POA: Diagnosis not present

## 2021-06-09 DIAGNOSIS — M25511 Pain in right shoulder: Secondary | ICD-10-CM | POA: Diagnosis not present

## 2021-06-13 DIAGNOSIS — M25552 Pain in left hip: Secondary | ICD-10-CM | POA: Diagnosis not present

## 2021-06-13 DIAGNOSIS — M5431 Sciatica, right side: Secondary | ICD-10-CM | POA: Diagnosis not present

## 2021-06-13 DIAGNOSIS — M25561 Pain in right knee: Secondary | ICD-10-CM | POA: Diagnosis not present

## 2021-06-13 DIAGNOSIS — Z79891 Long term (current) use of opiate analgesic: Secondary | ICD-10-CM | POA: Diagnosis not present

## 2021-06-13 DIAGNOSIS — M25562 Pain in left knee: Secondary | ICD-10-CM | POA: Diagnosis not present

## 2021-06-13 DIAGNOSIS — M25551 Pain in right hip: Secondary | ICD-10-CM | POA: Diagnosis not present

## 2021-06-13 DIAGNOSIS — M25511 Pain in right shoulder: Secondary | ICD-10-CM | POA: Diagnosis not present

## 2021-06-13 DIAGNOSIS — G894 Chronic pain syndrome: Secondary | ICD-10-CM | POA: Diagnosis not present

## 2021-06-13 DIAGNOSIS — M79662 Pain in left lower leg: Secondary | ICD-10-CM | POA: Diagnosis not present

## 2021-06-13 DIAGNOSIS — M25512 Pain in left shoulder: Secondary | ICD-10-CM | POA: Diagnosis not present

## 2021-06-13 DIAGNOSIS — M79661 Pain in right lower leg: Secondary | ICD-10-CM | POA: Diagnosis not present

## 2021-06-13 DIAGNOSIS — M545 Low back pain, unspecified: Secondary | ICD-10-CM | POA: Diagnosis not present

## 2021-06-13 DIAGNOSIS — M542 Cervicalgia: Secondary | ICD-10-CM | POA: Diagnosis not present

## 2021-06-13 DIAGNOSIS — M5432 Sciatica, left side: Secondary | ICD-10-CM | POA: Diagnosis not present

## 2021-07-15 DIAGNOSIS — M25561 Pain in right knee: Secondary | ICD-10-CM | POA: Diagnosis not present

## 2021-07-15 DIAGNOSIS — M25552 Pain in left hip: Secondary | ICD-10-CM | POA: Diagnosis not present

## 2021-07-15 DIAGNOSIS — G894 Chronic pain syndrome: Secondary | ICD-10-CM | POA: Diagnosis not present

## 2021-07-15 DIAGNOSIS — M79661 Pain in right lower leg: Secondary | ICD-10-CM | POA: Diagnosis not present

## 2021-07-15 DIAGNOSIS — M545 Low back pain, unspecified: Secondary | ICD-10-CM | POA: Diagnosis not present

## 2021-07-15 DIAGNOSIS — M79662 Pain in left lower leg: Secondary | ICD-10-CM | POA: Diagnosis not present

## 2021-07-15 DIAGNOSIS — M542 Cervicalgia: Secondary | ICD-10-CM | POA: Diagnosis not present

## 2021-07-15 DIAGNOSIS — M5431 Sciatica, right side: Secondary | ICD-10-CM | POA: Diagnosis not present

## 2021-07-15 DIAGNOSIS — M5432 Sciatica, left side: Secondary | ICD-10-CM | POA: Diagnosis not present

## 2021-07-15 DIAGNOSIS — M25562 Pain in left knee: Secondary | ICD-10-CM | POA: Diagnosis not present

## 2021-07-15 DIAGNOSIS — Z79891 Long term (current) use of opiate analgesic: Secondary | ICD-10-CM | POA: Diagnosis not present

## 2021-07-15 DIAGNOSIS — M25551 Pain in right hip: Secondary | ICD-10-CM | POA: Diagnosis not present

## 2021-07-15 DIAGNOSIS — M170 Bilateral primary osteoarthritis of knee: Secondary | ICD-10-CM | POA: Diagnosis not present

## 2021-08-14 ENCOUNTER — Other Ambulatory Visit: Payer: Self-pay | Admitting: Family Medicine

## 2021-08-14 DIAGNOSIS — M545 Low back pain, unspecified: Secondary | ICD-10-CM | POA: Diagnosis not present

## 2021-08-14 DIAGNOSIS — M25511 Pain in right shoulder: Secondary | ICD-10-CM | POA: Diagnosis not present

## 2021-08-14 DIAGNOSIS — I10 Essential (primary) hypertension: Secondary | ICD-10-CM

## 2021-08-14 DIAGNOSIS — M25561 Pain in right knee: Secondary | ICD-10-CM | POA: Diagnosis not present

## 2021-08-14 DIAGNOSIS — Z79891 Long term (current) use of opiate analgesic: Secondary | ICD-10-CM | POA: Diagnosis not present

## 2021-08-14 DIAGNOSIS — M5431 Sciatica, right side: Secondary | ICD-10-CM | POA: Diagnosis not present

## 2021-08-14 DIAGNOSIS — M5432 Sciatica, left side: Secondary | ICD-10-CM | POA: Diagnosis not present

## 2021-08-14 DIAGNOSIS — M25562 Pain in left knee: Secondary | ICD-10-CM | POA: Diagnosis not present

## 2021-08-14 DIAGNOSIS — M79661 Pain in right lower leg: Secondary | ICD-10-CM | POA: Diagnosis not present

## 2021-08-14 DIAGNOSIS — M79662 Pain in left lower leg: Secondary | ICD-10-CM | POA: Diagnosis not present

## 2021-08-14 DIAGNOSIS — M25552 Pain in left hip: Secondary | ICD-10-CM | POA: Diagnosis not present

## 2021-08-14 DIAGNOSIS — G894 Chronic pain syndrome: Secondary | ICD-10-CM | POA: Diagnosis not present

## 2021-08-14 DIAGNOSIS — M25512 Pain in left shoulder: Secondary | ICD-10-CM | POA: Diagnosis not present

## 2021-08-14 DIAGNOSIS — M25551 Pain in right hip: Secondary | ICD-10-CM | POA: Diagnosis not present

## 2021-08-20 DIAGNOSIS — M79672 Pain in left foot: Secondary | ICD-10-CM | POA: Diagnosis not present

## 2021-08-20 DIAGNOSIS — G894 Chronic pain syndrome: Secondary | ICD-10-CM | POA: Diagnosis not present

## 2021-08-20 DIAGNOSIS — M7662 Achilles tendinitis, left leg: Secondary | ICD-10-CM | POA: Diagnosis not present

## 2021-08-25 DIAGNOSIS — Z23 Encounter for immunization: Secondary | ICD-10-CM | POA: Diagnosis not present

## 2021-09-05 ENCOUNTER — Ambulatory Visit: Payer: Medicare Other | Admitting: Family Medicine

## 2021-09-12 DIAGNOSIS — M25552 Pain in left hip: Secondary | ICD-10-CM | POA: Diagnosis not present

## 2021-09-12 DIAGNOSIS — M25562 Pain in left knee: Secondary | ICD-10-CM | POA: Diagnosis not present

## 2021-09-12 DIAGNOSIS — M25512 Pain in left shoulder: Secondary | ICD-10-CM | POA: Diagnosis not present

## 2021-09-12 DIAGNOSIS — M79662 Pain in left lower leg: Secondary | ICD-10-CM | POA: Diagnosis not present

## 2021-09-12 DIAGNOSIS — M545 Low back pain, unspecified: Secondary | ICD-10-CM | POA: Diagnosis not present

## 2021-09-12 DIAGNOSIS — M79661 Pain in right lower leg: Secondary | ICD-10-CM | POA: Diagnosis not present

## 2021-09-12 DIAGNOSIS — M25551 Pain in right hip: Secondary | ICD-10-CM | POA: Diagnosis not present

## 2021-09-12 DIAGNOSIS — M25561 Pain in right knee: Secondary | ICD-10-CM | POA: Diagnosis not present

## 2021-09-12 DIAGNOSIS — M542 Cervicalgia: Secondary | ICD-10-CM | POA: Diagnosis not present

## 2021-09-12 DIAGNOSIS — M170 Bilateral primary osteoarthritis of knee: Secondary | ICD-10-CM | POA: Diagnosis not present

## 2021-09-12 DIAGNOSIS — G894 Chronic pain syndrome: Secondary | ICD-10-CM | POA: Diagnosis not present

## 2021-09-12 DIAGNOSIS — M25511 Pain in right shoulder: Secondary | ICD-10-CM | POA: Diagnosis not present

## 2021-09-12 DIAGNOSIS — Z79891 Long term (current) use of opiate analgesic: Secondary | ICD-10-CM | POA: Diagnosis not present

## 2021-09-20 ENCOUNTER — Other Ambulatory Visit: Payer: Self-pay | Admitting: Family Medicine

## 2021-09-20 DIAGNOSIS — B001 Herpesviral vesicular dermatitis: Secondary | ICD-10-CM

## 2021-09-20 NOTE — Telephone Encounter (Signed)
Requested medication (s) are due for refill today: yes  Requested medication (s) are on the active medication list: yes  Last refill:  05/02/21 #6 1 RF  Future visit scheduled: no  Notes to clinic:  routing to office to assess. Was not sure if I could refill this med. Was ordered for a cold sore.   Requested Prescriptions  Pending Prescriptions Disp Refills   valACYclovir (VALTREX) 1000 MG tablet [Pharmacy Med Name: valacyclovir 1 gram tablet] 6 tablet 1    Sig: TAKE ONE TABLET BY MOUTH TWICE DAILY     Antimicrobials:  Antiviral Agents - Anti-Herpetic Passed - 09/20/2021 11:31 AM      Passed - Valid encounter within last 12 months    Recent Outpatient Visits           3 months ago Need for vaccination for pneumococcus   [facility] Steele Sizer, MD   9 months ago Lipoma of anterior chest wall   [facility] Steele Sizer, MD   1 year ago COPD, mild [facility])   [facility] Steele Sizer, MD   1 year ago Major depression, recurrent, chronic [facility])   [facility] Steele Sizer, MD   1 year ago Fever blister   [facility] Steele Sizer, MD       Future Appointments             In 1 month  [facility], [place]   In 2 months [facility] [place], [place]

## 2021-09-22 NOTE — Telephone Encounter (Signed)
Lvm for pt to call and schedule an appt  

## 2021-10-13 DIAGNOSIS — M79672 Pain in left foot: Secondary | ICD-10-CM | POA: Diagnosis not present

## 2021-10-13 DIAGNOSIS — M7662 Achilles tendinitis, left leg: Secondary | ICD-10-CM | POA: Diagnosis not present

## 2021-10-14 DIAGNOSIS — G890 Central pain syndrome: Secondary | ICD-10-CM | POA: Diagnosis not present

## 2021-10-14 DIAGNOSIS — M545 Low back pain, unspecified: Secondary | ICD-10-CM | POA: Diagnosis not present

## 2021-10-14 DIAGNOSIS — M25552 Pain in left hip: Secondary | ICD-10-CM | POA: Diagnosis not present

## 2021-10-14 DIAGNOSIS — M542 Cervicalgia: Secondary | ICD-10-CM | POA: Diagnosis not present

## 2021-10-14 DIAGNOSIS — M25561 Pain in right knee: Secondary | ICD-10-CM | POA: Diagnosis not present

## 2021-10-14 DIAGNOSIS — M170 Bilateral primary osteoarthritis of knee: Secondary | ICD-10-CM | POA: Diagnosis not present

## 2021-10-14 DIAGNOSIS — G894 Chronic pain syndrome: Secondary | ICD-10-CM | POA: Diagnosis not present

## 2021-10-14 DIAGNOSIS — M25562 Pain in left knee: Secondary | ICD-10-CM | POA: Diagnosis not present

## 2021-10-14 DIAGNOSIS — M25511 Pain in right shoulder: Secondary | ICD-10-CM | POA: Diagnosis not present

## 2021-10-14 DIAGNOSIS — M79662 Pain in left lower leg: Secondary | ICD-10-CM | POA: Diagnosis not present

## 2021-10-14 DIAGNOSIS — M25551 Pain in right hip: Secondary | ICD-10-CM | POA: Diagnosis not present

## 2021-10-14 DIAGNOSIS — Z79891 Long term (current) use of opiate analgesic: Secondary | ICD-10-CM | POA: Diagnosis not present

## 2021-10-14 DIAGNOSIS — M25512 Pain in left shoulder: Secondary | ICD-10-CM | POA: Diagnosis not present

## 2021-10-14 DIAGNOSIS — M79661 Pain in right lower leg: Secondary | ICD-10-CM | POA: Diagnosis not present

## 2021-11-06 ENCOUNTER — Ambulatory Visit (INDEPENDENT_AMBULATORY_CARE_PROVIDER_SITE_OTHER): Payer: Medicare Other

## 2021-11-06 VITALS — BP 122/72 | HR 108 | Temp 98.4°F | Resp 16 | Ht 68.0 in | Wt 204.2 lb

## 2021-11-06 DIAGNOSIS — Z1211 Encounter for screening for malignant neoplasm of colon: Secondary | ICD-10-CM

## 2021-11-06 DIAGNOSIS — Z0000 Encounter for general adult medical examination without abnormal findings: Secondary | ICD-10-CM

## 2021-11-06 DIAGNOSIS — Z23 Encounter for immunization: Secondary | ICD-10-CM

## 2021-11-06 NOTE — Progress Notes (Signed)
Subjective:   [name] is a 61 y.o. female who presents for Medicare Annual (Subsequent) preventive examination.  Review of Systems     Cardiac Risk Factors include: advanced age (>19mn, >>23women);dyslipidemia;obesity (BMI >30kg/m2);hypertension     Objective:    Today's Vitals   11/06/21 1423  BP: 122/72  Pulse: (!) 108  Resp: 16  Temp: 98.4 F (36.9 C)  TempSrc: Oral  SpO2: 98%  Weight: 204 lb 3.2 oz (92.6 kg)  Height: _0  (1.727 m)   Body mass index is 31.05 kg/m.  Advanced Directives 11/06/2021 01/02/2021 01/01/2021 11/05/2020 04/17/2020 04/09/2020 08/01/2019  Does Patient Have a Medical Advance Directive? _1  No No  Would patient like information on creating a medical advance directive? No - Patient declined - No - Patient declined No - Patient declined No - Patient declined No - Patient declined No - Patient declined    Current Medications (verified) Outpatient Encounter Medications as of 11/06/2021  Medication Sig   amitriptyline (ELAVIL) 10 MG tablet TAKE ONE TABLET BY MOUTH AT BEDTIME FOR IBS   amLODipine (NORVASC) 5 MG tablet Take 1 tablet (5 mg total) by mouth daily.   atorvastatin (LIPITOR) 40 MG tablet Take 1 tablet (40 mg total) by mouth daily.   cholecalciferol (VITAMIN D) 1000 units tablet Take 1,000 Units by mouth daily.   desvenlafaxine (PRISTIQ) 50 MG 24 hr tablet Take 1 tablet (50 mg total) by mouth daily.   gabapentin (NEURONTIN) 300 MG capsule Take 300 mg by mouth 2 (two) times daily.    losartan-hydrochlorothiazide (HYZAAR) 100-25 MG tablet Take 1 tablet by mouth daily.   meloxicam (MOBIC) 15 MG tablet Take 15 mg by mouth daily.   metFORMIN (GLUCOPHAGE) 500 MG tablet Take 1 tablet (500 mg total) by mouth daily with breakfast.   Multiple Vitamins-Minerals (MULTIVITAMIN WITH MINERALS) tablet Take 1 tablet by mouth every other day.   NARCAN 4 MG/0.1ML LIQD nasal spray kit Place 1 spray into the nose once.   omeprazole (PRILOSEC) 20 MG  capsule Take 1 capsule (20 mg total) by mouth daily.   Oxycodone HCl 20 MG TABS Take 20 mg by mouth 4 (four) times daily.   QUEtiapine (SEROQUEL) 25 MG tablet Take 1 tablet (25 mg total) by mouth at bedtime.   tiZANidine (ZANAFLEX) 4 MG tablet Take 4 mg by mouth in the morning and at bedtime.   vitamin B-12 (CYANOCOBALAMIN) 1000 MCG tablet Take 1,000 mcg by mouth daily.   valACYclovir (VALTREX) 1000 MG tablet TAKE ONE TABLET BY MOUTH TWICE DAILY (Patient not taking: Reported on 11/06/2021)   No facility-administered encounter medications on file as of 11/06/2021.    Allergies (verified) Patient has no known allergies.   History: Past Medical History:  Diagnosis Date   Arthritis    bilateral knees   Contact dermatitis and other eczema, due to unspecified cause    COPD (chronic obstructive pulmonary disease) (HCC)    Depressive disorder    Dysmetabolic syndrome X    GERD (gastroesophageal reflux disease)    Hyperlipidemia    Hypertension    IBS (irritable bowel syndrome)    Insomnia    Lumbago    Nonspecific abnormal electrocardiogram (ECG) (EKG)    Other ovarian failure(256.39)    Postmenopausal atrophic vaginitis    Symptomatic menopausal or female climacteric states    Unspecified vitamin D deficiency    Past Surgical History:  Procedure Laterality Date   BACK SURGERY  BREAST EXCISIONAL BIOPSY Left ?   neg   BREAST LUMPECTOMY Left 01/08/2021   Procedure: BREAST LUMPECTOMY;  Surgeon: Ronny Bacon, MD;  Location: [facility];  Service: General;  Laterality: Left;   CARPAL TUNNEL RELEASE Left 08/15/2018   Procedure: CARPAL TUNNEL RELEASE;  Surgeon: Earnestine Leys, MD;  Location: [facility];  Service: Orthopedics;  Laterality: Left;   CERVICAL DISCECTOMY     x 2; metal plate   CHOLECYSTECTOMY     COLONOSCOPY WITH PROPOFOL N/A 08/08/2018   Procedure: COLONOSCOPY WITH PROPOFOL;  Surgeon: Virgel Manifold, MD;  Location: [facility];  Service: Endoscopy;  Laterality: N/A;    DILATATION & CURETTAGE/HYSTEROSCOPY WITH MYOSURE N/A 03/21/2015   Procedure: DILATATION & CURETTAGE/HYSTEROSCOPY;  Surgeon: Aletha Halim, MD;  Location: [facility];  Service: Gynecology;  Laterality: N/A;   DILATION AND CURETTAGE OF UTERUS     ENDOMETRIAL ABLATION     ESOPHAGOGASTRODUODENOSCOPY (EGD) WITH PROPOFOL N/A 04/13/2018   Procedure: ESOPHAGOGASTRODUODENOSCOPY (EGD) WITH PROPOFOL;  Surgeon: Virgel Manifold, MD;  Location: [facility];  Service: Endoscopy;  Laterality: N/A;   ESOPHAGOGASTRODUODENOSCOPY (EGD) WITH PROPOFOL N/A 08/08/2018   Procedure: ESOPHAGOGASTRODUODENOSCOPY (EGD) WITH PROPOFOL;  Surgeon: Virgel Manifold, MD;  Location: [facility];  Service: Endoscopy;  Laterality: N/A;   JOINT REPLACEMENT     SPINAL FUSION     lumbar x2   TONSILLECTOMY     TOTAL KNEE ARTHROPLASTY Left 04/12/2019   Procedure: TOTAL KNEE ARTHROPLASTY;  Surgeon: Lovell Sheehan, MD;  Location: [facility];  Service: Orthopedics;  Laterality: Left;   TOTAL KNEE ARTHROPLASTY Right 04/17/2020   Procedure: TOTAL KNEE ARTHROPLASTY;  Surgeon: Lovell Sheehan, MD;  Location: [facility];  Service: Orthopedics;  Laterality: Right;   TUBAL LIGATION     Family History  Problem Relation Age of Onset   Heart disease Mother    Thyroid cancer Mother    Congestive Heart Failure Mother    Depression Daughter    Asthma Daughter    Breast cancer Maternal Grandmother    Social History   Socioeconomic History   Marital status: Married    Spouse name: Not on file   Number of children: 4   Years of education: Not on file   Highest education level: 12th grade  Occupational History   Occupation: disabled    Comment: chronic back pain   Tobacco Use   Smoking status: Former    Packs/day: 1.00    Years: 20.00    Pack years: 20.00    Types: Cigarettes    Start date: 07/26/2002    Quit date: 04/06/2019    Years since quitting: 2.5   Smokeless tobacco: Never   Tobacco comments:    Quit Date 04/06/2019   Vaping Use   Vaping Use: Never used  Substance and Sexual Activity   Alcohol use: No    Alcohol/week: 0.0 standard drinks   Drug use: No   Sexual activity: Yes    Partners: Male    Birth control/protection: Other-see comments    Comment: Ablation  Other Topics Concern   Not on file  Social History Narrative   Not on file   Social Determinants of Health   Financial Resource Strain: Low Risk    Difficulty of Paying Living Expenses: Not hard at all  Food Insecurity: No Food Insecurity   Worried About Charity fundraiser in the Last Year: Never true   [place] in the Last Year: Never true  Transportation Needs: No  Transportation Needs   Lack of Transportation (Medical): No   Lack of Transportation (Non-Medical): No  Physical Activity: Inactive   Days of Exercise per Week: 0 days   Minutes of Exercise per Session: 0 min  Stress: No Stress Concern Present   Feeling of Stress : Not at all  Social Connections: Moderately Isolated   Frequency of Communication with Friends and Family: More than three times a week   Frequency of Social Gatherings with Friends and Family: Three times a week   Attends Religious Services: Never   Active Member of Clubs or Organizations: No   Attends Archivist Meetings: Never   Marital Status: Married    Tobacco Counseling Counseling given: Not Answered Tobacco comments: Quit Date 04/06/2019   Clinical Intake:  Pre-visit preparation completed: Yes  Pain : No/denies pain     BMI - recorded: 31.05 Nutritional Status: BMI > 30  Obese Nutritional Risks: None Diabetes: No  How often do you need to have someone help you when you read instructions, pamphlets, or other written materials from your doctor or pharmacy?: 1 - Never    Interpreter Needed?: No  Information entered by :: Clemetine Marker LPN   Activities of Daily Living In your present state of health, do you have any difficulty performing the following activities:  11/06/2021 06/04/2021  Hearing? N N  Vision? N N  Difficulty concentrating or making decisions? N N  Walking or climbing stairs? Y Y  Dressing or bathing? N N  Doing errands, shopping? N N  Preparing Food and eating ? N -  Using the Toilet? N -  In the past six months, have you accidently leaked urine? N -  Do you have problems with loss of bowel control? N -  Managing your Medications? N -  Managing your Finances? N -  Housekeeping or managing your Housekeeping? N -  Some recent data might be hidden    Patient Care Team: Steele Sizer, MD as PCP - General (Family Medicine) Shanon Ace, MD as Consulting Physician (Anesthesiology) Virgel Manifold, MD as Consulting Physician (Gastroenterology) Earnestine Leys, MD as Consulting Physician (Orthopedic Surgery)  Indicate any recent Medical Services you may have received from other than [facility] providers in the past year (date may be approximate).     Assessment:   This is a routine wellness examination for [name].  Hearing/Vision screen Hearing Screening - Comments:: Pt denies hearing difficulty  Vision Screening - Comments:: Annual vision screenings at [facility] issues and exercise activities discussed: Current Exercise Habits: The patient does not participate in regular exercise at present   Goals Addressed             This Visit's Progress    Quit Smoking   On track    Recommend to continue efforts to reduce smoking habits until no longer smoking (Smoking Cessation literature attached to AVS).       Depression Screen PHQ 2/9 Scores 11/06/2021 06/04/2021 11/27/2020 11/05/2020 08/07/2020 02/06/2020 12/01/2019  PHQ - 2 Score 2 0 2 0 _0 PHQ- 9 Score 3 0 4 - _1 Fall Risk Fall Risk  11/06/2021 06/04/2021 12/12/2020 11/27/2020 11/05/2020  Falls in the past year? 0 1 0 0 0  Number falls in past yr: 0 0 - 0 0  Injury with Fall? 0 0 - 0 0  Risk for fall due to : No Fall Risks - - - Orthopedic patient  Risk for fall due to: Comment - - - - -  Follow up Falls prevention discussed - - - Falls prevention discussed    FALL RISK PREVENTION PERTAINING TO THE HOME:  Any stairs in or around the home? Yes  If so, are there any without handrails? No  Home free of loose throw rugs in walkways, pet beds, electrical cords, etc? Yes  Adequate lighting in your home to reduce risk of falls? Yes   ASSISTIVE DEVICES UTILIZED TO PREVENT FALLS:  Life alert? No  Use of a cane, walker or w/c? No  Grab bars in the bathroom? No  Shower chair or bench in shower? No  Elevated toilet seat or a handicapped toilet? No   TIMED UP AND GO:  Was the test performed? Yes .  Length of time to ambulate 10 feet: 5 sec.   Gait steady and fast without use of assistive device  Cognitive Function: Normal cognitive status assessed by direct observation by this Nurse Health Advisor. No abnormalities found.       6CIT Screen 07/28/2018  What Year? 0 points  What month? 0 points  What time? 0 points  Count back from 20 0 points  Months in reverse 0 points  Repeat phrase 0 points  Total Score 0    Immunizations Immunization History  Administered Date(s) Administered   Influenza,inj,Quad PF,6+ Mos 08/08/2015, 08/26/2016, 07/26/2017, 07/28/2018, 08/01/2019, 08/07/2020, 11/06/2021   Influenza-Unspecified 09/02/2013, 07/03/2014   PFIZER(Purple Top)SARS-COV-2 Vaccination 03/13/2020, 04/03/2020, 11/08/2020   PNEUMOCOCCAL CONJUGATE-20 06/04/2021   Pfizer Covid-19 Vaccine Bivalent Booster 87yr & up 08/25/2021   Zoster Recombinat (Shingrix) 08/25/2021    TDAP status: Due, Education has been provided regarding the importance of this vaccine. Advised may receive this vaccine at local pharmacy or Health Dept. Aware to provide a copy of the vaccination record if obtained from local pharmacy or Health Dept. Verbalized acceptance and understanding.  Flu Vaccine status: Completed at today's visit  Pneumococcal vaccine  status: Up to date  Covid-19 vaccine status: Completed vaccines  Qualifies for Shingles Vaccine? Yes   Zostavax completed No   Shingrix Completed?: Yes - due for second dose  Screening Tests Health Maintenance  Topic Date Due   PAP SMEAR-Modifier  08/03/2021   COLONOSCOPY (Pts 45-446yrInsurance coverage will need to be confirmed)  08/08/2021   Zoster Vaccines- Shingrix (2 of 2) 10/20/2021   TETANUS/TDAP  12/05/2021 (Originally 04/17/2020)   MAMMOGRAM  12/31/2021   Pneumococcal Vaccine 1912455ears old  Completed   INFLUENZA VACCINE  Completed   COVID-19 Vaccine  Completed   Hepatitis C Screening  Completed   HIV Screening  Completed   HPV VACCINES  Aged Out    Health Maintenance  Health Maintenance Due  Topic Date Due   PAP SMEAR-Modifier  08/03/2021   COLONOSCOPY (Pts [id number] coverage will need to be confirmed)  08/08/2021   Zoster Vaccines- Shingrix (2 of 2) 10/20/2021    Colorectal cancer screening: Type of screening: Colonoscopy. Completed 08/08/18. Repeat every 3 years. [facility].   Mammogram status: Completed 12/31/20. Repeat every year  Bone density status: due age 63 63ung Cancer Screening: (Low Dose CT Chest recommended if Age 37-64-80ars, 30 pack-year currently smoking OR have quit w/in 15years.) does not qualify.    Additional Screening:  Hepatitis C Screening: does qualify; Completed 11/23/18  Vision Screening: Recommended annual ophthalmology exams for early detection of glaucoma and other disorders of the eye.  Is the patient up to date with their annual eye exam?  Yes  Who is the provider or what is the name of the office in which the patient attends annual eye exams? [facility].   Dental Screening: Recommended annual dental exams for proper oral hygiene  Community Resource Referral / Chronic Care Management: CRR required this visit?  No   CCM required this  visit?  No      Plan:     I have personally reviewed and noted the following in the patients chart:   Medical and social history Use of alcohol, tobacco or illicit drugs  Current medications and supplements including opioid prescriptions.  Functional ability and status Nutritional status Physical activity Advanced directives List of other physicians Hospitalizations, surgeries, and ER visits in previous 12 months Vitals Screenings to include cognitive, depression, and falls Referrals and appointments  In addition, I have reviewed and discussed with patient certain preventive protocols, quality metrics, and best practice recommendations. A written personalized care plan for preventive services as well as general preventive health recommendations were provided to patient.     Clemetine Marker, LPN   [id number]/[id number]   Nurse Notes: pt advised due for CPE and labs with Dr. Ancil Boozer

## 2021-11-06 NOTE — Patient Instructions (Signed)
[name] , Thank you for taking time to come for your Medicare Wellness Visit. I appreciate your ongoing commitment to your health goals. Please review the following plan we discussed and let me know if I can assist you in the future.   Screening recommendations/referrals: Colonoscopy: done 08/08/18. Referral sent today for repeat screening colonoscopy to [facility]. They will contact you for an appointment.  Mammogram: done 12/31/20 Bone Density: due age 10 Recommended yearly ophthalmology/optometry visit for glaucoma screening and checkup Recommended yearly dental visit for hygiene and checkup  Vaccinations: Influenza vaccine: done today Pneumococcal vaccine: done 06/04/21 Tdap vaccine: due Shingles vaccine: done 03/13/20, 04/03/20, 11/08/20 & 08/25/21  Covid-19: done 08/25/21  Advanced directives: Advance directive discussed with you today. Even though you declined this today please call our office should you change your mind and we can give you the proper paperwork for you to fill out.   Conditions/risks identified: Recommend increasing physical activity as tolerated  Next appointment: Follow up in one year for your annual wellness visit.   Preventive Care 40-64 Years, Female Preventive care refers to lifestyle choices and visits with your health care provider that can promote health and wellness. What does preventive care include? A yearly physical exam. This is also called an annual well check. Dental exams once or twice a year. Routine eye exams. Ask your health care provider how often you should have your eyes checked. Personal lifestyle choices, including: Daily care of your teeth and gums. Regular physical activity. Eating a healthy diet. Avoiding tobacco and drug use. Limiting alcohol use. Practicing safe sex. Taking low-dose aspirin daily starting at age 23. Taking vitamin and mineral supplements as recommended by your health care provider. What happens during  an annual well check? The services and screenings done by your health care provider during your annual well check will depend on your age, overall health, lifestyle risk factors, and family history of disease. Counseling  Your health care provider may ask you questions about your: Alcohol use. Tobacco use. Drug use. Emotional well-being. Home and relationship well-being. Sexual activity. Eating habits. Work and work Statistician. Method of birth control. Menstrual cycle. Pregnancy history. Screening  You may have the following tests or measurements: Height, weight, and BMI. Blood pressure. Lipid and cholesterol levels. These may be checked every 5 years, or more frequently if you are over 48 years old. Skin check. Lung cancer screening. You may have this screening every year starting at age 39 if you have a 30-pack-year history of smoking and currently smoke or have quit within the past 15 years. Fecal occult blood test (FOBT) of the stool. You may have this test every year starting at age 9. Flexible sigmoidoscopy or colonoscopy. You may have a sigmoidoscopy every 5 years or a colonoscopy every 10 years starting at age 25. Hepatitis C blood test. Hepatitis B blood test. Sexually transmitted disease (STD) testing. Diabetes screening. This is done by checking your blood sugar (glucose) after you have not eaten for a while (fasting). You may have this done every 1-3 years. Mammogram. This may be done every 1-2 years. Talk to your health care provider about when you should start having regular mammograms. This may depend on whether you have a family history of breast cancer. BRCA-related cancer screening. This may be done if you have a family history of breast, ovarian, tubal, or peritoneal cancers. Pelvic exam and Pap test. This may be done every 3 years starting at age 58. Starting at age 108,  this may be done every 5 years if you have a Pap test in combination with an HPV test. Bone  density scan. This is done to screen for osteoporosis. You may have this scan if you are at high risk for osteoporosis. Discuss your test results, treatment options, and if necessary, the need for more tests with your health care provider. Vaccines  Your health care provider may recommend certain vaccines, such as: Influenza vaccine. This is recommended every year. Tetanus, diphtheria, and acellular pertussis (Tdap, Td) vaccine. You may need a Td booster every 10 years. Zoster vaccine. You may need this after age 96. Pneumococcal 13-valent conjugate (PCV13) vaccine. You may need this if you have certain conditions and were not previously vaccinated. Pneumococcal polysaccharide (PPSV23) vaccine. You may need one or two doses if you smoke cigarettes or if you have certain conditions. Talk to your health care provider about which screenings and vaccines you need and how often you need them. This information is not intended to replace advice given to you by your health care provider. Make sure you discuss any questions you have with your health care provider. Document Released: 11/15/2015 Document Revised: 07/08/2016 Document Reviewed: 08/20/2015 Elsevier Interactive Patient Education  2017 [place] Prevention in the Home Falls can cause injuries. They can happen to people of all ages. There are many things you can do to make your home safe and to help prevent falls. What can I do on the outside of my home? Regularly fix the edges of walkways and driveways and fix any cracks. Remove anything that might make you trip as you walk through a door, such as a raised step or threshold. Trim any bushes or trees on the path to your home. Use bright outdoor lighting. Clear any walking paths of anything that might make someone trip, such as rocks or tools. Regularly check to see if handrails are loose or broken. Make sure that both sides of any steps have handrails. Any raised decks and  porches should have guardrails on the edges. Have any leaves, snow, or ice cleared regularly. Use sand or salt on walking paths during winter. Clean up any spills in your garage right away. This includes oil or grease spills. What can I do in the bathroom? Use night lights. Install grab bars by the toilet and in the tub and shower. Do not use towel bars as grab bars. Use non-skid mats or decals in the tub or shower. If you need to sit down in the shower, use a plastic, non-slip stool. Keep the floor dry. Clean up any water that spills on the floor as soon as it happens. Remove soap buildup in the tub or shower regularly. Attach bath mats securely with double-sided non-slip rug tape. Do not have throw rugs and other things on the floor that can make you trip. What can I do in the bedroom? Use night lights. Make sure that you have a light by your bed that is easy to reach. Do not use any sheets or blankets that are too big for your bed. They should not hang down onto the floor. Have a firm chair that has side arms. You can use this for support while you get dressed. Do not have throw rugs and other things on the floor that can make you trip. What can I do in the kitchen? Clean up any spills right away. Avoid walking on wet floors. Keep items that you use a lot in easy-to-reach  places. If you need to reach something above you, use a strong step stool that has a grab bar. Keep electrical cords out of the way. Do not use floor polish or wax that makes floors slippery. If you must use wax, use non-skid floor wax. Do not have throw rugs and other things on the floor that can make you trip. What can I do with my stairs? Do not leave any items on the stairs. Make sure that there are handrails on both sides of the stairs and use them. Fix handrails that are broken or loose. Make sure that handrails are as long as the stairways. Check any carpeting to make sure that it is firmly attached to the  stairs. Fix any carpet that is loose or worn. Avoid having throw rugs at the top or bottom of the stairs. If you do have throw rugs, attach them to the floor with carpet tape. Make sure that you have a light switch at the top of the stairs and the bottom of the stairs. If you do not have them, ask someone to add them for you. What else can I do to help prevent falls? Wear shoes that: Do not have high heels. Have rubber bottoms. Are comfortable and fit you well. Are closed at the toe. Do not wear sandals. If you use a stepladder: Make sure that it is fully opened. Do not climb a closed stepladder. Make sure that both sides of the stepladder are locked into place. Ask someone to hold it for you, if possible. Clearly mark and make sure that you can see: Any grab bars or handrails. First and last steps. Where the edge of each step is. Use tools that help you move around (mobility aids) if they are needed. These include: Canes. Walkers. Scooters. Crutches. Turn on the lights when you go into a dark area. Replace any light bulbs as soon as they burn out. Set up your furniture so you have a clear path. Avoid moving your furniture around. If any of your floors are uneven, fix them. If there are any pets around you, be aware of where they are. Review your medicines with your doctor. Some medicines can make you feel dizzy. This can increase your chance of falling. Ask your doctor what other things that you can do to help prevent falls. This information is not intended to replace advice given to you by your health care provider. Make sure you discuss any questions you have with your health care provider. Document Released: 08/15/2009 Document Revised: 03/26/2016 Document Reviewed: 11/23/2014 Elsevier Interactive Patient Education  2017 [employer].

## 2021-11-10 ENCOUNTER — Other Ambulatory Visit: Payer: Self-pay

## 2021-11-10 DIAGNOSIS — Z1211 Encounter for screening for malignant neoplasm of colon: Secondary | ICD-10-CM

## 2021-11-10 DIAGNOSIS — M7732 Calcaneal spur, left foot: Secondary | ICD-10-CM | POA: Diagnosis not present

## 2021-11-10 DIAGNOSIS — M7662 Achilles tendinitis, left leg: Secondary | ICD-10-CM | POA: Diagnosis not present

## 2021-11-10 MED ORDER — NA SULFATE-K SULFATE-MG SULF 17.5-3.13-1.6 GM/177ML PO SOLN: 1.0000 | Freq: Once | ORAL | 0 refills | Status: AC

## 2021-11-10 NOTE — Progress Notes (Signed)
Gastroenterology Pre-Procedure Review  Request Date: 12/01/2021 Requesting Physician: Dr. Marius Ditch  PATIENT REVIEW QUESTIONS: The patient responded to the following health history questions as indicated:    1. Are you having any GI issues? yes (IBS) 2. Do you have a personal history of Polyps? no 3. Do you have a family history of Colon Cancer or Polyps? no 4. Diabetes Mellitus? no 5. Joint replacements in the past 12 months?no 6. Major health problems in the past 3 months?no 7. Any artificial heart valves, MVP, or defibrillator?no    MEDICATIONS & ALLERGIES:    Patient reports the following regarding taking any anticoagulation/antiplatelet therapy:   Plavix, Coumadin, Eliquis, Xarelto, Lovenox, Pradaxa, Brilinta, or Effient? no Aspirin? no  Patient confirms/reports the following medications:  Current Outpatient Medications  Medication Sig Dispense Refill   amitriptyline (ELAVIL) 10 MG tablet TAKE ONE TABLET BY MOUTH AT BEDTIME FOR IBS 90 tablet 1   amLODipine (NORVASC) 5 MG tablet Take 1 tablet (5 mg total) by mouth daily. 90 tablet 1   atorvastatin (LIPITOR) 40 MG tablet Take 1 tablet (40 mg total) by mouth daily. 90 tablet 1   cholecalciferol (VITAMIN D) 1000 units tablet Take 1,000 Units by mouth daily.     desvenlafaxine (PRISTIQ) 50 MG 24 hr tablet Take 1 tablet (50 mg total) by mouth daily. 90 tablet 1   gabapentin (NEURONTIN) 300 MG capsule Take 300 mg by mouth 2 (two) times daily.      losartan-hydrochlorothiazide (HYZAAR) 100-25 MG tablet Take 1 tablet by mouth daily. 90 tablet 1   meloxicam (MOBIC) 15 MG tablet Take 15 mg by mouth daily.     metFORMIN (GLUCOPHAGE) 500 MG tablet Take 1 tablet (500 mg total) by mouth daily with breakfast. 90 tablet 1   Multiple Vitamins-Minerals (MULTIVITAMIN WITH MINERALS) tablet Take 1 tablet by mouth every other day.     NARCAN 4 MG/0.1ML LIQD nasal spray kit Place 1 spray into the nose once.     omeprazole (PRILOSEC) 20 MG capsule Take 1  capsule (20 mg total) by mouth daily. 90 capsule 1   Oxycodone HCl 20 MG TABS Take 20 mg by mouth 4 (four) times daily.     QUEtiapine (SEROQUEL) 25 MG tablet Take 1 tablet (25 mg total) by mouth at bedtime. 90 tablet 1   tiZANidine (ZANAFLEX) 4 MG tablet Take 4 mg by mouth in the morning and at bedtime.     valACYclovir (VALTREX) 1000 MG tablet TAKE ONE TABLET BY MOUTH TWICE DAILY (Patient not taking: Reported on 11/06/2021) 6 tablet 1   vitamin B-12 (CYANOCOBALAMIN) 1000 MCG tablet Take 1,000 mcg by mouth daily.     No current facility-administered medications for this visit.    Patient confirms/reports the following allergies:  No Known Allergies  No orders of the defined types were placed in this encounter.   AUTHORIZATION INFORMATION Primary Insurance: 1D#: Group #:  Secondary Insurance: 1D#: Group #:  SCHEDULE INFORMATION: Date: 12/01/2021  Time: Location: [place]

## 2021-11-11 ENCOUNTER — Other Ambulatory Visit: Payer: Self-pay | Admitting: Podiatry

## 2021-11-14 DIAGNOSIS — M545 Low back pain, unspecified: Secondary | ICD-10-CM | POA: Diagnosis not present

## 2021-11-14 DIAGNOSIS — M25512 Pain in left shoulder: Secondary | ICD-10-CM | POA: Diagnosis not present

## 2021-11-14 DIAGNOSIS — M79661 Pain in right lower leg: Secondary | ICD-10-CM | POA: Diagnosis not present

## 2021-11-14 DIAGNOSIS — M25562 Pain in left knee: Secondary | ICD-10-CM | POA: Diagnosis not present

## 2021-11-14 DIAGNOSIS — M25561 Pain in right knee: Secondary | ICD-10-CM | POA: Diagnosis not present

## 2021-11-14 DIAGNOSIS — G894 Chronic pain syndrome: Secondary | ICD-10-CM | POA: Diagnosis not present

## 2021-11-14 DIAGNOSIS — M25551 Pain in right hip: Secondary | ICD-10-CM | POA: Diagnosis not present

## 2021-11-14 DIAGNOSIS — M5432 Sciatica, left side: Secondary | ICD-10-CM | POA: Diagnosis not present

## 2021-11-14 DIAGNOSIS — M25511 Pain in right shoulder: Secondary | ICD-10-CM | POA: Diagnosis not present

## 2021-11-14 DIAGNOSIS — M542 Cervicalgia: Secondary | ICD-10-CM | POA: Diagnosis not present

## 2021-11-14 DIAGNOSIS — M79662 Pain in left lower leg: Secondary | ICD-10-CM | POA: Diagnosis not present

## 2021-11-14 DIAGNOSIS — M5431 Sciatica, right side: Secondary | ICD-10-CM | POA: Diagnosis not present

## 2021-11-14 DIAGNOSIS — Z79891 Long term (current) use of opiate analgesic: Secondary | ICD-10-CM | POA: Diagnosis not present

## 2021-11-14 DIAGNOSIS — M25552 Pain in left hip: Secondary | ICD-10-CM | POA: Diagnosis not present

## 2021-11-18 ENCOUNTER — Other Ambulatory Visit: Payer: Self-pay | Admitting: Family Medicine

## 2021-11-18 DIAGNOSIS — B001 Herpesviral vesicular dermatitis: Secondary | ICD-10-CM

## 2021-11-26 ENCOUNTER — Other Ambulatory Visit: Payer: Medicare Other

## 2021-11-28 ENCOUNTER — Other Ambulatory Visit: Payer: Self-pay

## 2021-11-28 ENCOUNTER — Encounter: Payer: Self-pay | Admitting: Gastroenterology

## 2021-11-28 MED ORDER — GOLYTELY 236 G PO SOLR: 4000.0000 mL | Freq: Once | ORAL | 0 refills | Status: AC

## 2021-11-28 NOTE — Progress Notes (Signed)
Patient states prep is not covered by her insurance. Sent Golytely to the pharmacy and gave her the instructions

## 2021-12-01 ENCOUNTER — Encounter
Admission: RE | Disposition: A | Discharge status: Home / Self Care | Payer: Self-pay | Source: Home / Self Care | Attending: Gastroenterology

## 2021-12-01 ENCOUNTER — Ambulatory Visit: Payer: Medicare Other | Admitting: Anesthesiology

## 2021-12-01 ENCOUNTER — Ambulatory Visit
Admission: RE | Admit: 2021-12-01 | Discharge: 2021-12-01 | Disposition: A | Discharge status: Home / Self Care | Payer: Medicare Other | Attending: Gastroenterology | Admitting: Gastroenterology

## 2021-12-01 ENCOUNTER — Encounter: Payer: Self-pay | Admitting: Gastroenterology

## 2021-12-01 DIAGNOSIS — Z09 Encounter for follow-up examination after completed treatment for conditions other than malignant neoplasm: Secondary | ICD-10-CM | POA: Insufficient documentation

## 2021-12-01 DIAGNOSIS — Z86010 Personal history of colonic polyps: Secondary | ICD-10-CM | POA: Diagnosis not present

## 2021-12-01 DIAGNOSIS — Z8711 Personal history of peptic ulcer disease: Secondary | ICD-10-CM | POA: Insufficient documentation

## 2021-12-01 DIAGNOSIS — I10 Essential (primary) hypertension: Secondary | ICD-10-CM | POA: Insufficient documentation

## 2021-12-01 DIAGNOSIS — Z1211 Encounter for screening for malignant neoplasm of colon: Secondary | ICD-10-CM

## 2021-12-01 DIAGNOSIS — F32A Depression, unspecified: Secondary | ICD-10-CM | POA: Diagnosis not present

## 2021-12-01 DIAGNOSIS — K219 Gastro-esophageal reflux disease without esophagitis: Secondary | ICD-10-CM | POA: Insufficient documentation

## 2021-12-01 DIAGNOSIS — Z87891 Personal history of nicotine dependence: Secondary | ICD-10-CM | POA: Insufficient documentation

## 2021-12-01 DIAGNOSIS — J449 Chronic obstructive pulmonary disease, unspecified: Secondary | ICD-10-CM | POA: Insufficient documentation

## 2021-12-01 HISTORY — PX: COLONOSCOPY WITH PROPOFOL: SHX5780

## 2021-12-01 SURGERY — COLONOSCOPY WITH PROPOFOL
Anesthesia: General

## 2021-12-01 MED ORDER — PROPOFOL 500 MG/50ML IV EMUL
INTRAVENOUS | Status: DC | PRN
Start: 2021-12-01 — End: 2021-12-01
  Administered 2021-12-01: 200 ug/kg/min via INTRAVENOUS

## 2021-12-01 MED ORDER — PROPOFOL 10 MG/ML IV BOLUS
INTRAVENOUS | Status: AC
  Filled 2021-12-01: qty 20

## 2021-12-01 MED ORDER — LIDOCAINE HCL (CARDIAC) PF 100 MG/5ML IV SOSY
PREFILLED_SYRINGE | INTRAVENOUS | Status: DC | PRN
  Administered 2021-12-01: 100 mg via INTRAVENOUS

## 2021-12-01 MED ORDER — SODIUM CHLORIDE 0.9 % IV SOLN
INTRAVENOUS | Status: DC
  Administered 2021-12-01: 1000 mL via INTRAVENOUS

## 2021-12-01 MED ORDER — PROPOFOL 10 MG/ML IV BOLUS
INTRAVENOUS | Status: DC | PRN
  Administered 2021-12-01 (×2): 100 mg via INTRAVENOUS

## 2021-12-01 NOTE — Transfer of Care (Addendum)
Immediate Anesthesia Transfer of Care Note  Patient: [name]  Procedure(s) Performed: COLONOSCOPY WITH PROPOFOL  Patient Location: PACU  Anesthesia Type:General  Level of Consciousness: drowsy  Airway & Oxygen Therapy: Patient Spontanous Breathing  Post-op Assessment: Report given to RN  Post vital signs: stable  Last Vitals:  Vitals Value Taken Time  BP    Temp    Pulse    Resp    SpO2      Last Pain:  Vitals:   12/01/21 0929  TempSrc: Temporal  PainSc: 0-No pain         Complications: No notable events documented.

## 2021-12-01 NOTE — Op Note (Signed)
[facility] Gastroenterology Patient Name: [name] Procedure Date: 12/01/2021 10:12 AM MRN: [id number] Account #: [id number] Date of Birth: 1961-10-14 Admit Type: Outpatient Age: 61 Room: [facility] ENDO ROOM 1 Gender: Female Note Status: Finalized Instrument Name: Jasper Riling [id number] Procedure:             Colonoscopy Indications:           Surveillance: Personal history of adenomatous polyps                         on last colonoscopy 3 years ago, Surveillance: History                         of adenomatous polyps, inadequate prep on last exam                         (<15yr), Last colonoscopy: October 2019 Providers:             Lin Landsman MD, MD Medicines:             General Anesthesia Complications:         No immediate complications. Estimated blood loss: None. Procedure:             Pre-Anesthesia Assessment:                        - Prior to the procedure, a History and Physical was                         performed, and patient medications and allergies were                         reviewed. The patient is competent. The risks and                         benefits of the procedure and the sedation options and                         risks were discussed with the patient. All questions                         were answered and informed consent was obtained.                         Patient identification and proposed procedure were                         verified by the physician, the nurse, the                         anesthesiologist, the anesthetist and the technician                         in the pre-procedure area in the procedure room in the                         endoscopy suite. Mental Status Examination: alert and  oriented. Airway Examination: normal oropharyngeal                         airway and neck mobility. Respiratory Examination:                         clear to auscultation. CV Examination: normal.                          Prophylactic Antibiotics: The patient does not require                         prophylactic antibiotics. Prior Anticoagulants: The                         patient has taken no previous anticoagulant or                         antiplatelet agents. ASA Grade Assessment: II - A                         patient with mild systemic disease. After reviewing                         the risks and benefits, the patient was deemed in                         satisfactory condition to undergo the procedure. The                         anesthesia plan was to use general anesthesia.                         Immediately prior to administration of medications,                         the patient was re-assessed for adequacy to receive                         sedatives. The heart rate, respiratory rate, oxygen                         saturations, blood pressure, adequacy of pulmonary                         ventilation, and response to care were monitored                         throughout the procedure. The physical status of the                         patient was re-assessed after the procedure.                        After obtaining informed consent, the colonoscope was                         passed under direct vision. Throughout the procedure,  the patient's blood pressure, pulse, and oxygen                         saturations were monitored continuously. The                         Colonoscope was introduced through the anus and                         advanced to the the cecum, identified by appendiceal                         orifice and ileocecal valve. The colonoscopy was                         performed without difficulty. The patient tolerated                         the procedure well. The quality of the bowel                         preparation was evaluated using the BBPS [facility] Bowel                         Preparation Scale) with scores of: Right Colon  = 3,                         Transverse Colon = 3 and Left Colon = 3 (entire mucosa                         seen well with no residual staining, small fragments                         of stool or opaque liquid). The total BBPS score                         equals 9. Findings:      The perianal and digital rectal examinations were normal. Pertinent       negatives include normal sphincter tone and no palpable rectal lesions.      The entire examined colon appeared normal.      The retroflexed view of the distal rectum and anal verge was normal and       showed no anal or rectal abnormalities. Impression:            - The entire examined colon is normal.                        - The distal rectum and anal verge are normal on                         retroflexion view.                        - No specimens collected. Recommendation:        - Discharge patient to home (with escort).                        -  Resume previous diet today.                        - Continue present medications.                        - Repeat colonoscopy in 10 years for screening                         purposes. Procedure Code(s):     --- Professional ---                        W8599, Colorectal cancer screening; colonoscopy on                         individual at high risk Diagnosis Code(s):     --- Professional ---                        Z86.010, Personal history of colonic polyps CPT copyright 2019 American Medical Association. All rights reserved. The codes documented in this report are preliminary and upon coder review may  be revised to meet current compliance requirements. Dr. Ulyess Mort Lin Landsman MD, MD 12/01/2021 10:39:22 AM This report has been signed electronically. Number of Addenda: 0 Note Initiated On: 12/01/2021 10:12 AM Scope Withdrawal Time: 0 hours 9 minutes 49 seconds  Total Procedure Duration: 0 hours 12 minutes 6 seconds  Estimated Blood Loss:  Estimated blood loss: none.       [facility]

## 2021-12-01 NOTE — H&P (Signed)
Cephas Darby, MD [address]  [place], [place] [address]  Main: [phone]  Fax: [phone] Pager: [phone]  Primary Care Physician:  Steele Sizer, MD Primary Gastroenterologist:  Dr. Cephas Darby  Pre-Procedure History & Physical: HPI:  [name] is a 61 y.o. female is here for a colonoscopy.   Past Medical History:  Diagnosis Date   Arthritis    bilateral knees   Contact dermatitis and other eczema, due to unspecified cause    COPD (chronic obstructive pulmonary disease) (HCC)    Depressive disorder    Dysmetabolic syndrome X    GERD (gastroesophageal reflux disease)    Hyperlipidemia    Hypertension    IBS (irritable bowel syndrome)    Insomnia    Lumbago    Nonspecific abnormal electrocardiogram (ECG) (EKG)    Other ovarian failure(256.39)    Postmenopausal atrophic vaginitis    Symptomatic menopausal or female climacteric states    Unspecified vitamin D deficiency     Past Surgical History:  Procedure Laterality Date   BACK SURGERY     BREAST EXCISIONAL BIOPSY Left ?   neg   BREAST LUMPECTOMY Left 01/08/2021   Procedure: BREAST LUMPECTOMY;  Surgeon: Ronny Bacon, MD;  Location: [facility];  Service: General;  Laterality: Left;   CARPAL TUNNEL RELEASE Left 08/15/2018   Procedure: CARPAL TUNNEL RELEASE;  Surgeon: Earnestine Leys, MD;  Location: [facility];  Service: Orthopedics;  Laterality: Left;   CERVICAL DISCECTOMY     x 2; metal plate   CHOLECYSTECTOMY     COLONOSCOPY WITH PROPOFOL N/A 08/08/2018   Procedure: COLONOSCOPY WITH PROPOFOL;  Surgeon: Virgel Manifold, MD;  Location: [facility];  Service: Endoscopy;  Laterality: N/A;   DILATATION & CURETTAGE/HYSTEROSCOPY WITH MYOSURE N/A 03/21/2015   Procedure: DILATATION & CURETTAGE/HYSTEROSCOPY;  Surgeon: Aletha Halim, MD;  Location: [facility];  Service: Gynecology;  Laterality: N/A;   DILATION AND CURETTAGE OF UTERUS     ENDOMETRIAL ABLATION      ESOPHAGOGASTRODUODENOSCOPY (EGD) WITH PROPOFOL N/A 04/13/2018   Procedure: ESOPHAGOGASTRODUODENOSCOPY (EGD) WITH PROPOFOL;  Surgeon: Virgel Manifold, MD;  Location: [facility];  Service: Endoscopy;  Laterality: N/A;   ESOPHAGOGASTRODUODENOSCOPY (EGD) WITH PROPOFOL N/A 08/08/2018   Procedure: ESOPHAGOGASTRODUODENOSCOPY (EGD) WITH PROPOFOL;  Surgeon: Virgel Manifold, MD;  Location: [facility];  Service: Endoscopy;  Laterality: N/A;   JOINT REPLACEMENT     SPINAL FUSION     lumbar x2   TONSILLECTOMY     TOTAL KNEE ARTHROPLASTY Left 04/12/2019   Procedure: TOTAL KNEE ARTHROPLASTY;  Surgeon: Lovell Sheehan, MD;  Location: [facility];  Service: Orthopedics;  Laterality: Left;   TOTAL KNEE ARTHROPLASTY Right 04/17/2020   Procedure: TOTAL KNEE ARTHROPLASTY;  Surgeon: Lovell Sheehan, MD;  Location: [facility];  Service: Orthopedics;  Laterality: Right;   TUBAL LIGATION      Prior to Admission medications   Medication Sig Start Date End Date Taking? Authorizing Provider  amitriptyline (ELAVIL) 10 MG tablet TAKE ONE TABLET BY MOUTH AT BEDTIME FOR IBS 06/04/21  Yes Sowles, Drue Stager, MD  amLODipine (NORVASC) 5 MG tablet Take 1 tablet (5 mg total) by mouth daily. 06/04/21  Yes Sowles, Drue Stager, MD  atorvastatin (LIPITOR) 40 MG tablet Take 1 tablet (40 mg total) by mouth daily. 06/04/21  Yes Sowles, Drue Stager, MD  cholecalciferol (VITAMIN D) 1000 units tablet Take 1,000 Units by mouth daily.   Yes [provider]  desvenlafaxine (PRISTIQ) 50 MG 24 hr tablet Take  1 tablet (50 mg total) by mouth daily. 06/04/21  Yes Sowles, Drue Stager, MD  gabapentin (NEURONTIN) 300 MG capsule Take 300 mg by mouth 2 (two) times daily.  06/12/19  Yes Shanon Ace, MD  losartan-hydrochlorothiazide (HYZAAR) 100-25 MG tablet Take 1 tablet by mouth daily. 06/04/21  Yes Sowles, Drue Stager, MD  meloxicam (MOBIC) 15 MG tablet Take 15 mg by mouth daily. 07/09/20  Yes [provider]  metFORMIN (GLUCOPHAGE) 500 MG  tablet Take 1 tablet (500 mg total) by mouth daily with breakfast. 06/04/21  Yes Sowles, Drue Stager, MD  Multiple Vitamins-Minerals (MULTIVITAMIN WITH MINERALS) tablet Take 1 tablet by mouth every other day.   Yes [provider]  omeprazole (PRILOSEC) 20 MG capsule Take 1 capsule (20 mg total) by mouth daily. 06/04/21  Yes Sowles, Drue Stager, MD  Oxycodone HCl 20 MG TABS Take 20 mg by mouth 4 (four) times daily. 07/16/20  Yes [facility]  QUEtiapine (SEROQUEL) 25 MG tablet Take 1 tablet (25 mg total) by mouth at bedtime. 06/04/21  Yes Sowles, Drue Stager, MD  tiZANidine (ZANAFLEX) 4 MG tablet Take 4 mg by mouth in the morning and at bedtime. 05/07/15  Yes [provider]  vitamin B-12 (CYANOCOBALAMIN) 1000 MCG tablet Take 1,000 mcg by mouth daily.   Yes [provider]  NARCAN 4 MG/0.1ML LIQD nasal spray kit Place 1 spray into the nose once. 03/14/19   Shanon Ace, MD  valACYclovir (VALTREX) 1000 MG tablet TAKE ONE TABLET BY MOUTH TWICE DAILY 11/18/21   Steele Sizer, MD    Allergies as of 11/10/2021   (No Known Allergies)    Family History  Problem Relation Age of Onset   Heart disease Mother    Thyroid cancer Mother    Congestive Heart Failure Mother    Depression Daughter    Asthma Daughter    Breast cancer Maternal Grandmother     Social History   Socioeconomic History   Marital status: Married    Spouse name: Not on file   Number of children: 4   Years of education: Not on file   Highest education level: 12th grade  Occupational History   Occupation: disabled    Comment: chronic back pain   Tobacco Use   Smoking status: Former    Packs/day: 1.00    Years: 20.00    Pack years: 20.00    Types: Cigarettes    Start date: 07/26/2002    Quit date: 04/06/2019    Years since quitting: 2.6   Smokeless tobacco: Never   Tobacco comments:    Quit Date 04/06/2019  Vaping Use   Vaping Use: Never used  Substance and Sexual Activity   Alcohol  use: No    Alcohol/week: 0.0 standard drinks   Drug use: No   Sexual activity: Yes    Partners: Male    Birth control/protection: Other-see comments    Comment: Ablation  Other Topics Concern   Not on file  Social History Narrative   Not on file   Social Determinants of Health   Financial Resource Strain: Low Risk    Difficulty of Paying Living Expenses: Not hard at all  Food Insecurity: No Food Insecurity   Worried About Charity fundraiser in the Last Year: Never true   [place] in the Last Year: Never true  Transportation Needs: No Transportation Needs   Lack of Transportation (Medical): No   Lack of Transportation (Non-Medical): No  Physical Activity: Inactive   Days  of Exercise per Week: 0 days   Minutes of Exercise per Session: 0 min  Stress: No Stress Concern Present   Feeling of Stress : Not at all  Social Connections: Moderately Isolated   Frequency of Communication with Friends and Family: More than three times a week   Frequency of Social Gatherings with Friends and Family: Three times a week   Attends Religious Services: Never   Active Member of Clubs or Organizations: No   Attends Archivist Meetings: Never   Marital Status: Married  Human resources officer Violence: Not At Risk   Fear of Current or Ex-Partner: No   Emotionally Abused: No   Physically Abused: No   Sexually Abused: No    Review of Systems: See HPI, otherwise negative ROS  Physical Exam: BP (!) 169/96    Pulse 100    Temp (!) 96.8 F (36 C) (Temporal)    Resp 18    Ht _0  (1.727 m)    Wt 90.9 kg    LMP  (LMP Unknown) Comment: no period for 7-8 years   SpO2 98%    BMI 30.47 kg/m  General:   Alert,  pleasant and cooperative in NAD Head:  Normocephalic and atraumatic. Neck:  Supple; no masses or thyromegaly. Lungs:  Clear throughout to auscultation.    Heart:  Regular rate and rhythm. Abdomen:  Soft, nontender and nondistended. Normal bowel sounds, without guarding, and without  rebound.   Neurologic:  Alert and  oriented x4;  grossly normal neurologically.  Impression/Plan: [name] is here for a colonoscopy to be performed for h/o adenomas colon  Risks, benefits, limitations, and alternatives regarding  colonoscopy have been reviewed with the patient.  Questions have been answered.  All parties agreeable.   Sherri Sear, MD  12/01/2021, 10:11 AM

## 2021-12-01 NOTE — Anesthesia Postprocedure Evaluation (Signed)
Anesthesia Post Note  Patient: [name]  Procedure(s) Performed: COLONOSCOPY WITH PROPOFOL  Patient location during evaluation: Endoscopy Anesthesia Type: General Level of consciousness: awake and alert Pain management: pain level controlled Vital Signs Assessment: post-procedure vital signs reviewed and stable Respiratory status: spontaneous breathing, nonlabored ventilation, respiratory function stable and patient connected to nasal cannula oxygen Cardiovascular status: blood pressure returned to baseline and stable Postop Assessment: no apparent nausea or vomiting Anesthetic complications: no   No notable events documented.   Last Vitals:  Vitals:   12/01/21 1041 12/01/21 1051  BP: (!) 147/98 (!) 139/105  Pulse:    Resp:    Temp: (!) 35.7 C   SpO2:      Last Pain:  Vitals:   12/01/21 1051  TempSrc:   PainSc: 0-No pain                 Arita Miss

## 2021-12-01 NOTE — Anesthesia Preprocedure Evaluation (Signed)
Anesthesia Evaluation  Patient identified by MRN, date of birth, ID band Patient awake    Reviewed: Allergy & Precautions, H&P , NPO status , Patient's Chart, lab work & pertinent test results  History of Anesthesia Complications Negative for: history of anesthetic complications  Airway Mallampati: II  TM Distance: >3 FB Neck ROM: full    Dental  (+) Teeth Intact   Pulmonary neg sleep apnea, COPD, Patient abstained from smoking.Not current smoker, former smoker,    breath sounds clear to auscultation       Cardiovascular hypertension, (-) angina(-) Past MI and (-) Cardiac Stents (-) dysrhythmias  Rhythm:regular Rate:Normal     Neuro/Psych PSYCHIATRIC DISORDERS Depression negative neurological ROS     GI/Hepatic Neg liver ROS, PUD, GERD  Controlled,  Endo/Other  negative endocrine ROS  Renal/GU      Musculoskeletal   Abdominal   Peds  Hematology negative hematology ROS (+)   Anesthesia Other Findings Past Medical History: No date: Arthritis     Comment:  bilateral knees No date: Contact dermatitis and other eczema, due to unspecified cause No date: COPD (chronic obstructive pulmonary disease) (HCC) No date: Depressive disorder No date: Dysmetabolic syndrome X No date: GERD (gastroesophageal reflux disease) No date: Hyperlipidemia No date: Hypertension No date: IBS (irritable bowel syndrome) No date: Insomnia No date: Lumbago No date: Nonspecific abnormal electrocardiogram (ECG) (EKG) No date: Other ovarian failure(256.39) No date: Postmenopausal atrophic vaginitis No date: Symptomatic menopausal or female climacteric states No date: Unspecified vitamin D deficiency  Past Surgical History: No date: BACK SURGERY ?: BREAST EXCISIONAL BIOPSY; Left     Comment:  neg 08/15/2018: CARPAL TUNNEL RELEASE; Left     Comment:  Procedure: CARPAL TUNNEL RELEASE;  Surgeon: Earnestine Leys, MD;  Location:  [facility];  Service: Orthopedics;                Laterality: Left; No date: CERVICAL DISCECTOMY     Comment:  x 2; metal plate No date: CHOLECYSTECTOMY 08/08/2018: COLONOSCOPY WITH PROPOFOL; N/A     Comment:  Procedure: COLONOSCOPY WITH PROPOFOL;  Surgeon:               Virgel Manifold, MD;  Location: [facility];                Service: Endoscopy;  Laterality: N/A; 03/21/2015: DILATATION & CURETTAGE/HYSTEROSCOPY WITH MYOSURE; N/A     Comment:  Procedure: DILATATION & CURETTAGE/HYSTEROSCOPY;                Surgeon: Aletha Halim, MD;  Location: [facility];                Service: Gynecology;  Laterality: N/A; No date: DILATION AND CURETTAGE OF UTERUS No date: ENDOMETRIAL ABLATION 04/13/2018: ESOPHAGOGASTRODUODENOSCOPY (EGD) WITH PROPOFOL; N/A     Comment:  Procedure: ESOPHAGOGASTRODUODENOSCOPY (EGD) WITH               PROPOFOL;  Surgeon: Virgel Manifold, MD;  Location:               [facility];  Service: Endoscopy;  Laterality: N/A; 08/08/2018: ESOPHAGOGASTRODUODENOSCOPY (EGD) WITH PROPOFOL; N/A     Comment:  Procedure: ESOPHAGOGASTRODUODENOSCOPY (EGD) WITH               PROPOFOL;  Surgeon: Virgel Manifold, MD;  Location:  [place] [facility];  Service: Endoscopy;  Laterality: N/A; No date: JOINT REPLACEMENT No date: SPINAL FUSION     Comment:  lumbar x2 No date: TONSILLECTOMY 04/12/2019: TOTAL KNEE ARTHROPLASTY; Left     Comment:  Procedure: TOTAL KNEE ARTHROPLASTY;  Surgeon: Lovell Sheehan, MD;  Location: [facility];  Service: Orthopedics;               Laterality: Left; 04/17/2020: TOTAL KNEE ARTHROPLASTY; Right     Comment:  Procedure: TOTAL KNEE ARTHROPLASTY;  Surgeon: Lovell Sheehan, MD;  Location: [facility];  Service: Orthopedics;               Laterality: Right; No date: TUBAL LIGATION  BMI    Body Mass Index: 30.41 kg/m      Reproductive/Obstetrics negative OB ROS                              Anesthesia Physical  Anesthesia Plan  ASA: 2  Anesthesia Plan: General   Post-op Pain Management: Minimal or no pain anticipated   Induction: Intravenous  PONV Risk Score and Plan: 3 and Ondansetron, Treatment may vary due to age or medical condition, Propofol infusion and TIVA  Airway Management Planned: Nasal Cannula  Additional Equipment: None  Intra-op Plan:   Post-operative Plan:   Informed Consent: I have reviewed the patients History and Physical, chart, labs and discussed the procedure including the risks, benefits and alternatives for the proposed anesthesia with the patient or authorized representative who has indicated his/her understanding and acceptance.     Dental Advisory Given  Plan Discussed with: Anesthesiologist, CRNA and Surgeon  Anesthesia Plan Comments: (Discussed risks of anesthesia with patient, including possibility of difficulty with spontaneous ventilation under anesthesia necessitating airway intervention, PONV, and rare risks such as cardiac or respiratory or neurological events, and allergic reactions. Discussed the role of CRNA in patient's perioperative care. Patient understands.)        Anesthesia Quick Evaluation

## 2021-12-05 ENCOUNTER — Ambulatory Visit: Admit: 2021-12-05 | Payer: Medicare Other | Admitting: Podiatry

## 2021-12-05 SURGERY — REPAIR, TENDON, ACHILLES
Anesthesia: Choice | Laterality: Left

## 2021-12-16 DIAGNOSIS — G894 Chronic pain syndrome: Secondary | ICD-10-CM | POA: Diagnosis not present

## 2021-12-16 DIAGNOSIS — Z79891 Long term (current) use of opiate analgesic: Secondary | ICD-10-CM | POA: Diagnosis not present

## 2021-12-17 ENCOUNTER — Ambulatory Visit: Payer: Medicare Other | Admitting: Dermatology

## 2021-12-17 ENCOUNTER — Other Ambulatory Visit: Payer: Self-pay | Admitting: Podiatry

## 2021-12-17 DIAGNOSIS — M79672 Pain in left foot: Secondary | ICD-10-CM | POA: Diagnosis not present

## 2021-12-17 DIAGNOSIS — M7732 Calcaneal spur, left foot: Secondary | ICD-10-CM | POA: Diagnosis not present

## 2021-12-17 DIAGNOSIS — M7662 Achilles tendinitis, left leg: Secondary | ICD-10-CM | POA: Diagnosis not present

## 2021-12-24 ENCOUNTER — Other Ambulatory Visit: Payer: Self-pay | Admitting: Family Medicine

## 2021-12-24 DIAGNOSIS — Z1231 Encounter for screening mammogram for malignant neoplasm of breast: Secondary | ICD-10-CM

## 2021-12-26 ENCOUNTER — Other Ambulatory Visit: Payer: Self-pay

## 2021-12-26 ENCOUNTER — Encounter
Admission: RE | Admit: 2021-12-26 | Discharge: 2021-12-26 | Disposition: A | Discharge status: Home / Self Care | Payer: Medicare Other | Source: Ambulatory Visit | Attending: Podiatry | Admitting: Podiatry

## 2021-12-26 VITALS — BP 142/99 | HR 92 | Temp 98.3°F | Resp 12 | Ht 68.0 in | Wt 203.0 lb

## 2021-12-26 DIAGNOSIS — Z01818 Encounter for other preprocedural examination: Secondary | ICD-10-CM | POA: Insufficient documentation

## 2021-12-26 DIAGNOSIS — Z01812 Encounter for preprocedural laboratory examination: Secondary | ICD-10-CM

## 2021-12-26 DIAGNOSIS — I10 Essential (primary) hypertension: Secondary | ICD-10-CM | POA: Insufficient documentation

## 2021-12-26 DIAGNOSIS — J449 Chronic obstructive pulmonary disease, unspecified: Secondary | ICD-10-CM | POA: Insufficient documentation

## 2021-12-26 DIAGNOSIS — Z01810 Encounter for preprocedural cardiovascular examination: Secondary | ICD-10-CM | POA: Diagnosis not present

## 2021-12-26 HISTORY — DX: Anemia, unspecified: D64.9

## 2021-12-26 HISTORY — DX: Diverticulosis of intestine, part unspecified, without perforation or abscess without bleeding: K57.90

## 2021-12-26 HISTORY — DX: Peptic ulcer, site unspecified, unspecified as acute or chronic, without hemorrhage or perforation: K27.9

## 2021-12-26 HISTORY — DX: Bronchitis, not specified as acute or chronic: J40

## 2021-12-26 HISTORY — DX: Carpal tunnel syndrome, bilateral upper limbs: G56.03

## 2021-12-26 HISTORY — DX: Atherosclerosis of aorta: I70.0

## 2021-12-26 HISTORY — DX: Dermatitis, unspecified: L30.9

## 2021-12-26 HISTORY — DX: Bilateral primary osteoarthritis of knee: M17.0

## 2021-12-26 LAB — BASIC METABOLIC PANEL
Anion gap: 13 (ref 5–15)
BUN: 15 mg/dL (ref 6–20)
CO2: 24 mmol/L (ref 22–32)
Calcium: 9.3 mg/dL (ref 8.9–10.3)
Chloride: 101 mmol/L (ref 98–111)
Creatinine, Ser: 0.62 mg/dL (ref 0.44–1.00)
GFR, Estimated: >60 mL/min (ref >60)
Glucose, Bld: 102 mg/dL — ABNORMAL HIGH (ref 70–99)
Potassium: 3.7 mmol/L (ref 3.5–5.1)
Sodium: 138 mmol/L (ref 135–145)

## 2021-12-26 LAB — CBC
HCT: 39.1 % (ref 36.0–46.0)
Hemoglobin: 13.2 g/dL (ref 12.0–15.0)
MCH: 31.7 pg (ref 26.0–34.0)
MCHC: 33.8 g/dL (ref 30.0–36.0)
MCV: 93.8 fL (ref 80.0–100.0)
Platelets: 268 10*3/uL (ref 150–400)
RBC: 4.17 MIL/uL (ref 3.87–5.11)
RDW: 13.1 % (ref 11.5–15.5)
WBC: 9 10*3/uL (ref 4.0–10.5)
nRBC: 0 % (ref 0.0–0.2)

## 2021-12-26 NOTE — Patient Instructions (Addendum)
Your procedure is scheduled on: Friday, March 3 Report to the Registration Desk on the 1st floor of the [facility]. To find out your arrival time, please call [phone] between 1PM - 3PM on: Thursday, March 2  REMEMBER: Instructions that are not followed completely may result in serious medical risk, up to and including death; or upon the discretion of your surgeon and anesthesiologist your surgery may need to be rescheduled.  Do not eat food after midnight the night before surgery.  No gum chewing, lozengers or hard candies.  You may however, drink CLEAR liquids up to 2 hours before you are scheduled to arrive for your surgery. Do not drink anything within 2 hours of your scheduled arrival time.  Clear liquids include: - water  - apple juice without pulp - gatorade (not RED colors) - black coffee or tea (Do NOT add milk or creamers to the coffee or tea) Do NOT drink anything that is not on this list.  In addition, your doctor has ordered for you to drink the provided  Ensure Pre-Surgery Clear Carbohydrate Drink  Drinking this carbohydrate drink up to two hours before surgery helps to reduce insulin resistance and improve patient outcomes. Please complete drinking 2 hours prior to scheduled arrival time.  TAKE THESE MEDICATIONS THE MORNING OF SURGERY WITH A SIP OF WATER:  Amlodipine Desvenlafaxine (Pristiq) Omeprazole (Prilosec) - (take one the night before and one on the morning of surgery - helps to prevent nausea after surgery.)  Stop Metformin 2 days prior to surgery. Last day to take metformin is February 28. Resume AFTER surgery.  One week prior to surgery: starting February 24 Stop meloxicam and Anti-inflammatories (NSAIDS) such as Advil, Aleve, Ibuprofen, Motrin, Naproxen, Naprosyn and Aspirin based products such as Excedrin, Goodys Powder, BC Powder. Stop ANY OVER THE COUNTER supplements until after surgery. Stop vitamin D, multivitamin You may however, continue to  take Tylenol if needed for pain up until the day of surgery.  No Alcohol for 24 hours before or after surgery.  No Smoking including e-cigarettes for 24 hours prior to surgery.  No chewable tobacco products for at least 6 hours prior to surgery.  No nicotine patches on the day of surgery.  Do not use any "recreational" drugs for at least a week prior to your surgery.  Please be advised that the combination of cocaine and anesthesia may have negative outcomes, up to and including death. If you test positive for cocaine, your surgery will be cancelled.  On the morning of surgery brush your teeth with toothpaste and water, you may rinse your mouth with mouthwash if you wish. Do not swallow any toothpaste or mouthwash.  Use CHG Soap as directed on instruction sheet.  Do not wear jewelry, make-up, hairpins, clips or nail polish.  Do not wear lotions, powders, or perfumes.   Do not shave body from the neck down 48 hours prior to surgery just in case you cut yourself which could leave a site for infection.  Also, freshly shaved skin may become irritated if using the CHG soap.  Contact lenses, hearing aids and dentures may not be worn into surgery.  Do not bring valuables to the hospital. [facility] is not responsible for any missing/lost belongings or valuables.   Notify your doctor if there is any change in your medical condition (cold, fever, infection).  Wear comfortable clothing (specific to your surgery type) to the hospital.  After surgery, you can help prevent lung complications by doing  breathing exercises.  Take deep breaths and cough every 1-2 hours. Your doctor may order a device called an Incentive Spirometer to help you take deep breaths.  If you are being discharged the day of surgery, you will not be allowed to drive home. You will need a responsible adult (18 years or older) to drive you home and stay with you that night.   If you are taking public transportation, you  will need to have a responsible adult (18 years or older) with you. Please confirm with your physician that it is acceptable to use public transportation.   Please call the [facility] at [phone] if you have any questions about these instructions.  Surgery Visitation Policy:  Patients undergoing a surgery or procedure may have one family member or support person with them as long as that person is not COVID-19 positive or experiencing its symptoms.  That person may remain in the waiting area during the procedure and may rotate out with other people.

## 2022-01-02 ENCOUNTER — Ambulatory Visit: Payer: Medicare Other | Admitting: Anesthesiology

## 2022-01-02 ENCOUNTER — Ambulatory Visit
Admission: RE | Admit: 2022-01-02 | Discharge: 2022-01-02 | Disposition: A | Discharge status: Home / Self Care | Payer: Medicare Other | Source: Ambulatory Visit | Attending: Podiatry | Admitting: Podiatry

## 2022-01-02 ENCOUNTER — Ambulatory Visit: Payer: Medicare Other

## 2022-01-02 ENCOUNTER — Other Ambulatory Visit: Payer: Self-pay

## 2022-01-02 ENCOUNTER — Encounter: Payer: Self-pay | Admitting: Podiatry

## 2022-01-02 ENCOUNTER — Encounter
Admission: RE | Disposition: A | Discharge status: Home / Self Care | Payer: Self-pay | Source: Ambulatory Visit | Attending: Podiatry

## 2022-01-02 DIAGNOSIS — M899 Disorder of bone, unspecified: Secondary | ICD-10-CM | POA: Insufficient documentation

## 2022-01-02 DIAGNOSIS — Z8711 Personal history of peptic ulcer disease: Secondary | ICD-10-CM | POA: Insufficient documentation

## 2022-01-02 DIAGNOSIS — I10 Essential (primary) hypertension: Secondary | ICD-10-CM | POA: Insufficient documentation

## 2022-01-02 DIAGNOSIS — E785 Hyperlipidemia, unspecified: Secondary | ICD-10-CM | POA: Diagnosis not present

## 2022-01-02 DIAGNOSIS — J449 Chronic obstructive pulmonary disease, unspecified: Secondary | ICD-10-CM | POA: Insufficient documentation

## 2022-01-02 DIAGNOSIS — M7662 Achilles tendinitis, left leg: Secondary | ICD-10-CM | POA: Diagnosis not present

## 2022-01-02 DIAGNOSIS — K219 Gastro-esophageal reflux disease without esophagitis: Secondary | ICD-10-CM | POA: Insufficient documentation

## 2022-01-02 DIAGNOSIS — M7732 Calcaneal spur, left foot: Secondary | ICD-10-CM | POA: Diagnosis not present

## 2022-01-02 DIAGNOSIS — F32A Depression, unspecified: Secondary | ICD-10-CM | POA: Diagnosis not present

## 2022-01-02 HISTORY — PX: OSTECTOMY: SHX6439

## 2022-01-02 HISTORY — PX: ACHILLES TENDON SURGERY: SHX542

## 2022-01-02 SURGERY — REPAIR, TENDON, ACHILLES
Anesthesia: General | Site: Ankle | Laterality: Left

## 2022-01-02 MED ORDER — SUGAMMADEX SODIUM 200 MG/2ML IV SOLN
INTRAVENOUS | Status: DC | PRN
  Administered 2022-01-02: 300 mg via INTRAVENOUS

## 2022-01-02 MED ORDER — DEXAMETHASONE SODIUM PHOSPHATE 10 MG/ML IJ SOLN
INTRAMUSCULAR | Status: DC | PRN
  Administered 2022-01-02: 10 mg via INTRAVENOUS

## 2022-01-02 MED ORDER — SUCCINYLCHOLINE CHLORIDE 200 MG/10ML IV SOSY
PREFILLED_SYRINGE | INTRAVENOUS | Status: DC | PRN
  Administered 2022-01-02: 100 mg via INTRAVENOUS

## 2022-01-02 MED ORDER — MIDAZOLAM HCL 2 MG/2ML IJ SOLN
INTRAMUSCULAR | Status: DC | PRN
  Administered 2022-01-02: 2 mg via INTRAVENOUS

## 2022-01-02 MED ORDER — FENTANYL CITRATE (PF) 100 MCG/2ML IJ SOLN
INTRAMUSCULAR | Status: AC
  Filled 2022-01-02: qty 2

## 2022-01-02 MED ORDER — ONDANSETRON HCL 4 MG/2ML IJ SOLN
INTRAMUSCULAR | Status: DC | PRN
Start: 2022-01-02 — End: 2022-01-02
  Administered 2022-01-02: 4 mg via INTRAVENOUS

## 2022-01-02 MED ORDER — LACTATED RINGERS IV SOLN: INTRAVENOUS | Status: DC

## 2022-01-02 MED ORDER — ACETAMINOPHEN 10 MG/ML IV SOLN
INTRAVENOUS | Status: DC | PRN
  Administered 2022-01-02: 1000 mg via INTRAVENOUS

## 2022-01-02 MED ORDER — OXYCODONE-ACETAMINOPHEN 5-325 MG PO TABS
ORAL_TABLET | ORAL | Status: AC
  Filled 2022-01-02: qty 1

## 2022-01-02 MED ORDER — FENTANYL CITRATE (PF) 100 MCG/2ML IJ SOLN
INTRAMUSCULAR | Status: DC | PRN
  Administered 2022-01-02 (×2): 50 ug via INTRAVENOUS

## 2022-01-02 MED ORDER — GLYCOPYRROLATE 0.2 MG/ML IJ SOLN
INTRAMUSCULAR | Status: DC | PRN
  Administered 2022-01-02: .2 mg via INTRAVENOUS

## 2022-01-02 MED ORDER — 0.9 % SODIUM CHLORIDE (POUR BTL) OPTIME
TOPICAL | Status: DC | PRN
  Administered 2022-01-02: 1000 mL

## 2022-01-02 MED ORDER — OXYCODONE-ACETAMINOPHEN 5-325 MG PO TABS
1.0000 | ORAL_TABLET | Freq: Once | ORAL | Status: AC
  Administered 2022-01-02: 1 via ORAL

## 2022-01-02 MED ORDER — ACETAMINOPHEN 10 MG/ML IV SOLN
INTRAVENOUS | Status: AC
  Filled 2022-01-02: qty 100

## 2022-01-02 MED ORDER — CHLORHEXIDINE GLUCONATE 0.12 % MT SOLN
OROMUCOSAL | Status: AC
  Administered 2022-01-02: 15 mL via OROMUCOSAL
  Filled 2022-01-02: qty 15

## 2022-01-02 MED ORDER — ONDANSETRON HCL 4 MG/2ML IJ SOLN: 4.0000 mg | Freq: Once | INTRAMUSCULAR | Status: DC | PRN

## 2022-01-02 MED ORDER — CEFAZOLIN SODIUM-DEXTROSE 2-4 GM/100ML-% IV SOLN
INTRAVENOUS | Status: AC
  Filled 2022-01-02: qty 100

## 2022-01-02 MED ORDER — ORAL CARE MOUTH RINSE: 15.0000 mL | Freq: Once | OROMUCOSAL | Status: AC

## 2022-01-02 MED ORDER — FENTANYL CITRATE (PF) 100 MCG/2ML IJ SOLN: 25.0000 ug | INTRAMUSCULAR | Status: DC | PRN

## 2022-01-02 MED ORDER — BUPIVACAINE-EPINEPHRINE (PF) 0.25% -1:200000 IJ SOLN
INTRAMUSCULAR | Status: AC
  Filled 2022-01-02: qty 30

## 2022-01-02 MED ORDER — CHLORHEXIDINE GLUCONATE 0.12 % MT SOLN: 15.0000 mL | Freq: Once | OROMUCOSAL | Status: AC

## 2022-01-02 MED ORDER — OXYCODONE-ACETAMINOPHEN 5-325 MG PO TABS: 1.0000 | ORAL_TABLET | Freq: Four times a day (QID) | ORAL | 0 refills | Status: DC | PRN

## 2022-01-02 MED ORDER — MIDAZOLAM HCL 2 MG/2ML IJ SOLN
INTRAMUSCULAR | Status: AC
  Filled 2022-01-02: qty 2

## 2022-01-02 MED ORDER — LIDOCAINE HCL (CARDIAC) PF 100 MG/5ML IV SOSY
PREFILLED_SYRINGE | INTRAVENOUS | Status: DC | PRN
  Administered 2022-01-02: 100 mg via INTRAVENOUS

## 2022-01-02 MED ORDER — BUPIVACAINE-EPINEPHRINE 0.25% -1:200000 IJ SOLN
INTRAMUSCULAR | Status: DC | PRN
  Administered 2022-01-02: 20 mL

## 2022-01-02 MED ORDER — PROPOFOL 10 MG/ML IV BOLUS
INTRAVENOUS | Status: DC | PRN
Start: 2022-01-02 — End: 2022-01-02
  Administered 2022-01-02: 150 mg via INTRAVENOUS

## 2022-01-02 MED ORDER — CEFAZOLIN SODIUM-DEXTROSE 2-4 GM/100ML-% IV SOLN
2.0000 g | INTRAVENOUS | Status: AC
  Administered 2022-01-02: 2 g via INTRAVENOUS

## 2022-01-02 MED ORDER — ROCURONIUM BROMIDE 100 MG/10ML IV SOLN
INTRAVENOUS | Status: DC | PRN
Start: 2022-01-02 — End: 2022-01-02
  Administered 2022-01-02: 10 mg via INTRAVENOUS
  Administered 2022-01-02: 50 mg via INTRAVENOUS
  Administered 2022-01-02: 10 mg via INTRAVENOUS

## 2022-01-02 SURGICAL SUPPLY — 70 items
ANCHOR 4.5 FOOTPRINT ULTRA (Anchor) ×1 IMPLANT
BENZOIN TINCTURE PRP APPL 2/3 (GAUZE/BANDAGES/DRESSINGS) ×1 IMPLANT
BIT DRILL 4X4.5 FOOTPRINT STR (BIT) IMPLANT
BLADE SURG 15 STRL LF DISP TIS (BLADE) ×1 IMPLANT
BLADE SURG 15 STRL SS (BLADE) ×2
BLADE SURG MINI STRL (BLADE) ×2 IMPLANT
BNDG COHESIVE 4X5 TAN ST LF (GAUZE/BANDAGES/DRESSINGS) ×2 IMPLANT
BNDG CONFORM 2 STRL LF (GAUZE/BANDAGES/DRESSINGS) ×2 IMPLANT
BNDG CONFORM 3 STRL LF (GAUZE/BANDAGES/DRESSINGS) ×2 IMPLANT
BNDG ELASTIC 4X5.8 VLCR NS LF (GAUZE/BANDAGES/DRESSINGS) ×4 IMPLANT
BNDG ESMARK 4X12 TAN STRL LF (GAUZE/BANDAGES/DRESSINGS) ×1 IMPLANT
BNDG ESMARK 6X12 TAN STRL LF (GAUZE/BANDAGES/DRESSINGS) ×1 IMPLANT
BNDG GAUZE ELAST 4 BULKY (GAUZE/BANDAGES/DRESSINGS) ×2 IMPLANT
BOOT STEPPER DURA MED (SOFTGOODS) ×1 IMPLANT
CUFF TOURN SGL QUICK 18X4 (TOURNIQUET CUFF) ×1 IMPLANT
DRAPE FLUOR MINI C-ARM 54X84 (DRAPES) ×2 IMPLANT
DRILL 4X4.5 FOOTPRINT STR (BIT) ×2
DURAPREP 26ML APPLICATOR (WOUND CARE) ×2 IMPLANT
ELECT REM PT RETURN 9FT ADLT (ELECTROSURGICAL) ×2
ELECTRODE REM PT RTRN 9FT ADLT (ELECTROSURGICAL) ×1 IMPLANT
GAUZE SPONGE 4X4 12PLY STRL (GAUZE/BANDAGES/DRESSINGS) ×2 IMPLANT
GAUZE XEROFORM 1X8 LF (GAUZE/BANDAGES/DRESSINGS) ×2 IMPLANT
GLOVE SURG ENC MOIS LTX SZ7.5 (GLOVE) ×2 IMPLANT
GLOVE SURG UNDER LTX SZ8 (GLOVE) ×2 IMPLANT
GOWN STRL REUS W/ TWL XL LVL3 (GOWN DISPOSABLE) ×2 IMPLANT
GOWN STRL REUS W/TWL XL LVL3 (GOWN DISPOSABLE) ×4
HANDLE YANKAUER SUCT BULB TIP (MISCELLANEOUS) ×2 IMPLANT
KIT TURNOVER KIT A (KITS) ×2 IMPLANT
LABEL OR SOLS (LABEL) ×2 IMPLANT
MANIFOLD NEPTUNE II (INSTRUMENTS) ×2 IMPLANT
NDL FILTER BLUNT 18X1 1/2 (NEEDLE) ×1 IMPLANT
NDL HYPO 25X1 1.5 SAFETY (NEEDLE) ×3 IMPLANT
NDL MAYO CATGUT SZ5 (NEEDLE)
NDL SUT 5 .5 CRC TPR PNT MAYO (NEEDLE) ×1 IMPLANT
NEEDLE FILTER BLUNT 18X 1/2SAF (NEEDLE)
NEEDLE FILTER BLUNT 18X1 1/2 (NEEDLE) IMPLANT
NEEDLE HYPO 25X1 1.5 SAFETY (NEEDLE) ×6 IMPLANT
NS IRRIG 500ML POUR BTL (IV SOLUTION) ×1 IMPLANT
PACK EXTREMITY ARMC (MISCELLANEOUS) ×2 IMPLANT
PAD PREP 24X41 OB/GYN DISP (PERSONAL CARE ITEMS) ×2 IMPLANT
RASP SM TEAR CROSS CUT (RASP) ×2 IMPLANT
SPLINT CAST 1 STEP 4X30 (MISCELLANEOUS) IMPLANT
SPLINT CAST 1 STEP 5X30 WHT (MISCELLANEOUS) ×1 IMPLANT
SPLINT FAST PLASTER 5X30 (CAST SUPPLIES) ×1
SPLINT PLASTER CAST FAST 5X30 (CAST SUPPLIES) ×1 IMPLANT
SPONGE T-LAP 18X18 ~~LOC~~+RFID (SPONGE) ×2 IMPLANT
STAPLER SKIN PROX 35W (STAPLE) ×1 IMPLANT
STOCKINETTE M/LG 89821 (MISCELLANEOUS) ×2 IMPLANT
STRIP CLOSURE SKIN 1/2X4 (GAUZE/BANDAGES/DRESSINGS) ×2 IMPLANT
STRIP CLOSURE SKIN 1/4X4 (GAUZE/BANDAGES/DRESSINGS) ×2 IMPLANT
SUT MNCRL 4-0 (SUTURE) ×2
SUT MNCRL 4-0 27XMFL (SUTURE) ×1
SUT PDS AB 0 CT1 27 (SUTURE) IMPLANT
SUT ULTRABRAID #2 38 (SUTURE) ×2 IMPLANT
SUT VIC AB 0 SH 27 (SUTURE) ×1 IMPLANT
SUT VIC AB 2-0 CT1 27 (SUTURE)
SUT VIC AB 2-0 CT1 TAPERPNT 27 (SUTURE) ×1 IMPLANT
SUT VIC AB 2-0 SH 27 (SUTURE) ×2
SUT VIC AB 2-0 SH 27XBRD (SUTURE) ×2 IMPLANT
SUT VIC AB 3-0 SH 27 (SUTURE) ×2
SUT VIC AB 3-0 SH 27X BRD (SUTURE) ×1 IMPLANT
SUT VIC AB 4-0 FS2 27 (SUTURE) ×1 IMPLANT
SUT VICRYL AB 3-0 FS1 BRD 27IN (SUTURE) ×1 IMPLANT
SUTURE MNCRL 4-0 27XMF (SUTURE) ×1 IMPLANT
SWABSTK COMLB BENZOIN TINCTURE (MISCELLANEOUS) ×2 IMPLANT
SYR 10ML LL (SYRINGE) ×4 IMPLANT
SYR 3ML LL SCALE MARK (SYRINGE) ×1 IMPLANT
WAND TOPAZ MICRO DEBRIDER (MISCELLANEOUS) ×1 IMPLANT
WATER STERILE IRR 500ML POUR (IV SOLUTION) ×2 IMPLANT
WIRE Z .062 C-WIRE SPADE TIP (WIRE) IMPLANT

## 2022-01-02 NOTE — Transfer of Care (Signed)
Immediate Anesthesia Transfer of Care Note ? ?Patient: [name] ? ?Procedure(s) Performed: ACHILLES TENDON REPAIR-SECONDARY (Left: Ankle) ?CALCANEAL EXOSTECTOMY (Left: Ankle) ? ?Patient Location: PACU ? ?Anesthesia Type:General ? ?Level of Consciousness: awake, drowsy and patient cooperative ? ?Airway & Oxygen Therapy: Patient Spontanous Breathing and Patient connected to face mask oxygen ? ?Post-op Assessment: Report given to RN and Post -op Vital signs reviewed and stable ? ?Post vital signs: Reviewed and stable ? ?Last Vitals:  ?Vitals Value Taken Time  ?BP 139/86 01/02/22 1133  ?Temp    ?Pulse 90 01/02/22 1136  ?Resp 22 01/02/22 1136  ?SpO2 94 % 01/02/22 1136  ?Vitals shown include unvalidated device data. ? ?Last Pain:  ?Vitals:  ? 01/02/22 0845  ?TempSrc: Temporal  ?PainSc: 7   ?   ? ?  ? ?Complications: No notable events documented. ?

## 2022-01-02 NOTE — Discharge Instructions (Addendum)
[facility]  POST OPERATIVE INSTRUCTIONS FOR DR. FOWLER AND [facility] PODIAT[facility]PART[facility]   Take your medication as prescribed.  Pain medication should be taken only as needed.  Keep the dressing clean, dry and intact.  Keep your foot elevated above the heart level for the first 48 hours.  We have instructed you to be non-weight bearing.  Always wear your post-op shoe when walking.  Always use your crutches if you are to be non-weight bearing.  Do not take a shower. Baths are permissible as long as the foot is kept out of the water.   Every hour you are awake:  Bend your knee 15 times.  Call [facility] ([phone]) if any of the following problems occur: You develop a temperature or fever. The bandage becomes saturated with blood. Medication does not stop your pain. Injury of the foot occurs. Any symptoms of infection including redness, odor, or red streaks running from wound.  AMBULATORY SURGERY  DISCHARGE INSTRUCTIONS   The drugs that you were given will stay in your system until tomorrow so for the next 24 hours you should not:  Drive an automobile Make any legal decisions Drink any alcoholic beverage   You may resume regular meals tomorrow.  Today it is better to start with liquids and gradually work up to solid foods.  You may eat anything you prefer, but it is better to start with liquids, then soup and crackers, and gradually work up to solid foods.   Please notify your doctor immediately if you have any unusual bleeding, trouble breathing, redness and pain at the surgery site, drainage, fever, or pain not relieved by medication.    Additional Instructions:  Please contact your physician with any problems or Same Day Surgery at [phone], Monday through Friday 6 am to 4 pm, or [facility] [place] [facility] number at [phone]. 

## 2022-01-02 NOTE — H&P (Signed)
HISTORY AND PHYSICAL INTERVAL NOTE: ? ?01/02/2022 ? ?9:17 AM ? ?[name]  has presented today for surgery, with the diagnosis of M76.62-Achilles tendonitis of left lower extremity ?M77.32-Posterior calcaneal exostosis, left.  The various methods of treatment have been discussed with the patient.  No guarantees were given.  After consideration of risks, benefits and other options for treatment, the patient has consented to surgery.  I have reviewed the patients? chart and labs.   ? ? ?A history and physical examination was performed in my office.  The patient was reexamined.  There have been no changes to this history and physical examination. ? ?Samara Deist A ? ?

## 2022-01-02 NOTE — Anesthesia Procedure Notes (Signed)
Procedure Name: Intubation ?Date/Time: 01/02/2022 9:42 AM ?Performed by: Kelton Pillar, CRNA ?Pre-anesthesia Checklist: Patient identified, Emergency Drugs available, Suction available and Patient being monitored ?Patient Re-evaluated:Patient Re-evaluated prior to induction ?Oxygen Delivery Method: Circle system utilized ?Preoxygenation: Pre-oxygenation with 100% oxygen ?Induction Type: IV induction ?Ventilation: Mask ventilation without difficulty ?Laryngoscope Size: McGraph and 3 ?Grade View: Grade I ?Tube type: Oral ?Tube size: 6.5 mm ?Number of attempts: 1 ?Airway Equipment and Method: Stylet and Oral airway ?Placement Confirmation: ETT inserted through vocal cords under direct vision, positive ETCO2, breath sounds checked- equal and bilateral and CO2 detector ?Secured at: 21 cm ?Tube secured with: Tape ?Dental Injury: Teeth and Oropharynx as per pre-operative assessment  ? ? ? ? ?

## 2022-01-02 NOTE — Anesthesia Preprocedure Evaluation (Signed)
Anesthesia Evaluation  ?Patient identified by MRN, date of birth, ID band ?Patient awake ? ? ? ?Reviewed: ?Allergy & Precautions, H&P , NPO status , Patient's Chart, lab work & pertinent test results ? ?History of Anesthesia Complications ?Negative for: history of anesthetic complications ? ?Airway ?Mallampati: II ? ?TM Distance: >3 FB ?Neck ROM: full ? ? ? Dental ? ?(+) Teeth Intact, Dental Advidsory Given ?  ?Pulmonary ?neg shortness of breath, neg sleep apnea, COPD, neg recent URI, Patient abstained from smoking.Not current smoker, former smoker,  ?  ?breath sounds clear to auscultation ? ? ? ? ? ? Cardiovascular ?hypertension, (-) angina(-) Past MI and (-) Cardiac Stents (-) dysrhythmias  ?Rhythm:regular Rate:Normal ? ? ?  ?Neuro/Psych ?PSYCHIATRIC DISORDERS Depression negative neurological ROS ?   ? GI/Hepatic ?Neg liver ROS, PUD, GERD  Controlled,  ?Endo/Other  ?negative endocrine ROS ? Renal/GU ?  ? ?  ?Musculoskeletal ? ? Abdominal ?  ?Peds ? Hematology ?negative hematology ROS ?(+)   ?Anesthesia Other Findings ?Past Medical History: ?No date: Arthritis ?    Comment:  bilateral knees ?No date: Contact dermatitis and other eczema, due to unspecified cause ?No date: COPD (chronic obstructive pulmonary disease) ([place]) ?No date: Depressive disorder ?No date: Dysmetabolic syndrome X ?No date: GERD (gastroesophageal reflux disease) ?No date: Hyperlipidemia ?No date: Hypertension ?No date: IBS (irritable bowel syndrome) ?No date: Insomnia ?No date: Lumbago ?No date: Nonspecific abnormal electrocardiogram (ECG) (EKG) ?No date: Other ovarian failure(256.39) ?No date: Postmenopausal atrophic vaginitis ?No date: Symptomatic menopausal or female climacteric states ?No date: Unspecified vitamin D deficiency ? ?Past Surgical History: ?No date: BACK SURGERY ??: BREAST EXCISIONAL BIOPSY; Left ?    Comment:  neg ?08/15/2018: CARPAL TUNNEL RELEASE; Left ?    Comment:  Procedure: CARPAL TUNNEL  RELEASE;  Surgeon: Sabra Heck,  ?             Nadara Mustard, MD;  Location: [facility] ORS;  Service: Orthopedics;   ?             Laterality: Left; ?No date: CERVICAL DISCECTOMY ?    Comment:  x 2; metal plate ?No date: CHOLECYSTECTOMY ?08/08/2018: COLONOSCOPY WITH PROPOFOL; N/A ?    Comment:  Procedure: COLONOSCOPY WITH PROPOFOL;  Surgeon:  ?             Virgel Manifold, MD;  Location: [facility];   ?             Service: Endoscopy;  Laterality: N/A; ?03/21/2015: DILATATION & CURETTAGE/HYSTEROSCOPY WITH MYOSURE; N/A ?    Comment:  Procedure: DILATATION & CURETTAGE/HYSTEROSCOPY;   ?             Surgeon: Aletha Halim, MD;  Location: [facility] ORS;   ?             Service: Gynecology;  Laterality: N/A; ?No date: DILATION AND CURETTAGE OF UTERUS ?No date: ENDOMETRIAL ABLATION ?04/13/2018: ESOPHAGOGASTRODUODENOSCOPY (EGD) WITH PROPOFOL; N/A ?    Comment:  Procedure: ESOPHAGOGASTRODUODENOSCOPY (EGD) WITH  ?             PROPOFOL;  Surgeon: Virgel Manifold, MD;  Location:  ?             [facility];  Service: Endoscopy;  Laterality: N/A; ?08/08/2018: ESOPHAGOGASTRODUODENOSCOPY (EGD) WITH PROPOFOL; N/A ?    Comment:  Procedure: ESOPHAGOGASTRODUODENOSCOPY (EGD) WITH  ?             PROPOFOL;  Surgeon: Virgel Manifold, MD;  Location:  ?  [facility];  Service: Endoscopy;  Laterality: N/A; ?No date: JOINT REPLACEMENT ?No date: SPINAL FUSION ?    Comment:  lumbar x2 ?No date: TONSILLECTOMY ?04/12/2019: TOTAL KNEE ARTHROPLASTY; Left ?    Comment:  Procedure: TOTAL KNEE ARTHROPLASTY;  Surgeon: Harlow Mares  ?             Elyn Aquas, MD;  Location: [facility];  Service: Orthopedics;  ?             Laterality: Left; ?04/17/2020: TOTAL KNEE ARTHROPLASTY; Right ?    Comment:  Procedure: TOTAL KNEE ARTHROPLASTY;  Surgeon: Harlow Mares  ?             Elyn Aquas, MD;  Location: [facility];  Service: Orthopedics;  ?             Laterality: Right; ?No date: TUBAL LIGATION ? ?BMI   ? Body Mass Index: 30.41 kg/m?  ?  ? ?  Reproductive/Obstetrics ?negative OB ROS ? ?  ? ? ? ? ? ? ? ? ? ? ? ? ? ?  ?  ? ? ? ? ? ? ? ? ?Anesthesia Physical ? ?Anesthesia Plan ? ?ASA: 2 ? ?Anesthesia Plan: General  ? ?Post-op Pain Management: Minimal or no pain anticipated  ? ?Induction: Intravenous ? ?PONV Risk Score and Plan: 3 and Ondansetron, Treatment may vary due to age or medical condition and Dexamethasone ? ?Airway Management Planned: Oral ETT ? ?Additional Equipment: None ? ?Intra-op Plan:  ? ?Post-operative Plan: Extubation in OR ? ?Informed Consent: I have reviewed the patients History and Physical, chart, labs and discussed the procedure including the risks, benefits and alternatives for the proposed anesthesia with the patient or authorized representative who has indicated his/her understanding and acceptance.  ? ? ? ?Dental Advisory Given ? ?Plan Discussed with: Anesthesiologist, CRNA and Surgeon ? ?Anesthesia Plan Comments:   ? ? ? ? ? ? ?Anesthesia Quick Evaluation ? ?

## 2022-01-02 NOTE — Op Note (Signed)
Operative note ? ? Surgeon:Matthew Pais Vickki Muff ? ?  Assistant: None ? ?  Preop diagnosis: 1.  Left Achilles insertional tendinitis with tendinosis 2.  Left posterior calcaneal exostosis ? ?  Postop diagnosis: Same ? ?  Procedure: 1.  Secondary repair posterior left Achilles insertional tendinosis 2.  Left posterior calcaneal exostectomy ? ?  EBL: Minimal ? ?  Anesthesia:local and general.  Local consisted of a total of 10 cc of 0.25% bupivacaine and 10 cc of Exparel long-acting anesthetic ? ?  Hemostasis: Epinephrine with 0.25% bupivacaine ? ?  Specimen: None ? ?  Complications: None ? ?  Operative indications:[name] is an 61 y.o. that presents today for surgical intervention.  The risks/benefits/alternatives/complications have been discussed and consent has been given. ? ?  Procedure:  ?Patient was brought into the OR and placed on the operating table in theprone position. After anesthesia was obtained theleft lower extremity was prepped and draped in usual sterile fashion. ? ?Attention was directed to the posterior aspect of the left Achilles insertional site where longitudinal incision was performed.  Sharp and blunt dissection carried down to the peritenon.  The peritenon was then incised.  A tendon splitting incision was made through the tendon.  This was reflected medial lateral and proximal.  At this time a noted loose calcaneal exostosis was found.  This was then removed and further removal of exostosis was performed at this time.  Further contouring of the posterior aspect of the calcaneus was performed including the posterior and superior aspect of the calcaneus.  The wound was flushed with copious amounts of irrigation.  At this time attention was directed to the Achilles itself where scar tissue and fibrotic tissue within the Achilles itself was noted.  This was then excised.  The longitudinal splitting incision was reanastomosed.  The tendon was infiltrated with a Topaz wand.  Next a Magnum wire was  used and a Krak?w type suture from from distal to proximal then brought back proximal to distal.  A 5.0 mm footprint bone anchor was used into the posterior calcaneus and the Achilles was stabilized to the calcaneus.  Further evaluation was performed.  The wound was flushed with copious amounts of irrigation.  Closure was performed with a 3-0 Vicryl for the peritenon and subcutaneous tissue and a 4-0 Monocryl for the skin.  A bulky sterile dressing was applied and the patient was placed in neutral position in a equalizer walker type boot. ? ?  Patient tolerated the procedure and anesthesia well.  Was transported from the OR to the PACU with all vital signs stable and vascular status intact. To be discharged per routine protocol.  Will follow up in approximately 1 week in the outpatient clinic. ? ?

## 2022-01-03 NOTE — Anesthesia Postprocedure Evaluation (Signed)
Anesthesia Post Note ? ?Patient: [name] ? ?Procedure(s) Performed: ACHILLES TENDON REPAIR-SECONDARY (Left: Ankle) ?CALCANEAL EXOSTECTOMY (Left: Ankle) ? ?Patient location during evaluation: PACU ?Anesthesia Type: General ?Level of consciousness: awake and alert ?Pain management: pain level controlled ?Vital Signs Assessment: post-procedure vital signs reviewed and stable ?Respiratory status: spontaneous breathing, nonlabored ventilation, respiratory function stable and patient connected to nasal cannula oxygen ?Cardiovascular status: blood pressure returned to baseline and stable ?Postop Assessment: no apparent nausea or vomiting ?Anesthetic complications: no ? ? ?No notable events documented. ? ? ?Last Vitals:  ?Vitals:  ? 01/02/22 1210 01/02/22 1240  ?BP: (!) 163/89 (!) 170/90  ?Pulse: 80 81  ?Resp: 17 16  ?Temp:  (!) 36.1 ?C  ?SpO2: 96% 100%  ?  ?Last Pain:  ?Vitals:  ? 01/02/22 1240  ?TempSrc:   ?PainSc: 4   ? ? ?  ?  ?  ?  ?  ?  ? ?Martha Clan ? ? ? ? ?

## 2022-01-07 DIAGNOSIS — M7662 Achilles tendinitis, left leg: Secondary | ICD-10-CM | POA: Diagnosis not present

## 2022-01-13 DIAGNOSIS — M542 Cervicalgia: Secondary | ICD-10-CM | POA: Diagnosis not present

## 2022-01-13 DIAGNOSIS — M25552 Pain in left hip: Secondary | ICD-10-CM | POA: Diagnosis not present

## 2022-01-13 DIAGNOSIS — G894 Chronic pain syndrome: Secondary | ICD-10-CM | POA: Diagnosis not present

## 2022-01-13 DIAGNOSIS — M25512 Pain in left shoulder: Secondary | ICD-10-CM | POA: Diagnosis not present

## 2022-01-13 DIAGNOSIS — M545 Low back pain, unspecified: Secondary | ICD-10-CM | POA: Diagnosis not present

## 2022-01-13 DIAGNOSIS — M25562 Pain in left knee: Secondary | ICD-10-CM | POA: Diagnosis not present

## 2022-01-13 DIAGNOSIS — M25551 Pain in right hip: Secondary | ICD-10-CM | POA: Diagnosis not present

## 2022-01-13 DIAGNOSIS — Z79891 Long term (current) use of opiate analgesic: Secondary | ICD-10-CM | POA: Diagnosis not present

## 2022-01-13 DIAGNOSIS — M79661 Pain in right lower leg: Secondary | ICD-10-CM | POA: Diagnosis not present

## 2022-01-13 DIAGNOSIS — M25561 Pain in right knee: Secondary | ICD-10-CM | POA: Diagnosis not present

## 2022-01-13 DIAGNOSIS — M5431 Sciatica, right side: Secondary | ICD-10-CM | POA: Diagnosis not present

## 2022-01-13 DIAGNOSIS — M5432 Sciatica, left side: Secondary | ICD-10-CM | POA: Diagnosis not present

## 2022-01-13 DIAGNOSIS — M79662 Pain in left lower leg: Secondary | ICD-10-CM | POA: Diagnosis not present

## 2022-01-13 DIAGNOSIS — M25511 Pain in right shoulder: Secondary | ICD-10-CM | POA: Diagnosis not present

## 2022-02-03 ENCOUNTER — Other Ambulatory Visit: Payer: Self-pay | Admitting: Family Medicine

## 2022-02-03 DIAGNOSIS — F339 Major depressive disorder, recurrent, unspecified: Secondary | ICD-10-CM

## 2022-02-03 DIAGNOSIS — F5104 Psychophysiologic insomnia: Secondary | ICD-10-CM

## 2022-02-04 ENCOUNTER — Ambulatory Visit: Payer: Self-pay | Admitting: *Deleted

## 2022-02-04 NOTE — Telephone Encounter (Signed)
?  Chief Complaint: fatigue ?Symptoms: fatigue, no appetite-nausea, headache ?Frequency: 3 days ?Pertinent Negatives: Patient denies chest pain, fever, cough, SOB, vomiting, diarrhea, bleeding, other areas of pain ?Disposition: '[]'$ ED /'[]'$ Urgent Care (no appt availability in office) / '[x]'$ Appointment(In office/virtual)/ '[]'$  [facility]/ '[]'$ Home Care/ '[]'$ Refused Recommended Disposition /'[]'$ [facility]/ '[]'$  Follow-up with PCP ?Additional Notes: Patient has been scheduled for appointment in am- advised if she has any of the above symptoms- go to ED. Advised hydrate and small meals when possible until seen.  ?

## 2022-02-04 NOTE — Telephone Encounter (Signed)
Summary: fatigue / head aches  ? Patient experiencing fatigue, smell of food makes her nausea, head aches for 2 days seeking clinical advice prior to her 4/6/20223 appointment.   ?  ? ?Reason for Disposition ? [1] MODERATE weakness (i.e., interferes with work, school, normal activities) AND [2] cause unknown  (Exceptions: weakness with acute minor illness, or weakness from poor fluid intake) ? ?Answer Assessment - Initial Assessment Questions ?1. DESCRIPTION: "Describe how you are feeling." ?    Wakes tired- recent surgery 3/3 ?2. SEVERITY: "How bad is it?"  "Can you stand and walk?" ?  - MILD - Feels weak or tired, but does not interfere with work, school or normal activities ?  - MODERATE - Able to stand and walk; weakness interferes with work, school, or normal activities ?  - SEVERE - Unable to stand or walk ?    moderate ?3. ONSET:  "When did the weakness begin?" ?    Since Sunday ?4. CAUSE: "What do you think is causing the weakness?" ?    unsure ?5. MEDICINES: "Have you recently started a new medicine or had a change in the amount of a medicine?" ?    No changes in medications ?6. OTHER SYMPTOMS: "Do you have any other symptoms?" (e.g., chest pain, fever, cough, SOB, vomiting, diarrhea, bleeding, other areas of pain) ?    No appetite - nausea, headache ?7. PREGNANCY: "Is there any chance you are pregnant?" "When was your last menstrual period?" ? ?Protocols used: Weakness (Generalized) and Fatigue-A-AH ? ?

## 2022-02-05 ENCOUNTER — Encounter: Payer: Self-pay | Admitting: Family Medicine

## 2022-02-05 ENCOUNTER — Ambulatory Visit (INDEPENDENT_AMBULATORY_CARE_PROVIDER_SITE_OTHER): Payer: Medicare Other | Admitting: Family Medicine

## 2022-02-05 VITALS — BP 136/76 | HR 100 | Temp 98.3°F | Resp 16 | Ht 68.0 in | Wt 185.7 lb

## 2022-02-05 DIAGNOSIS — R110 Nausea: Secondary | ICD-10-CM | POA: Diagnosis not present

## 2022-02-05 DIAGNOSIS — F5104 Psychophysiologic insomnia: Secondary | ICD-10-CM

## 2022-02-05 DIAGNOSIS — R61 Generalized hyperhidrosis: Secondary | ICD-10-CM | POA: Diagnosis not present

## 2022-02-05 DIAGNOSIS — R5383 Other fatigue: Secondary | ICD-10-CM | POA: Diagnosis not present

## 2022-02-05 DIAGNOSIS — R519 Headache, unspecified: Secondary | ICD-10-CM

## 2022-02-05 DIAGNOSIS — F339 Major depressive disorder, recurrent, unspecified: Secondary | ICD-10-CM

## 2022-02-05 DIAGNOSIS — R634 Abnormal weight loss: Secondary | ICD-10-CM

## 2022-02-05 DIAGNOSIS — I4581 Long QT syndrome: Secondary | ICD-10-CM

## 2022-02-05 DIAGNOSIS — R5381 Other malaise: Secondary | ICD-10-CM

## 2022-02-05 LAB — POCT URINALYSIS DIPSTICK
Glucose, UA: NEGATIVE
Ketones, UA: NEGATIVE
Nitrite, UA: NEGATIVE
Protein, UA: POSITIVE — AB
Spec Grav, UA: 1.01 (ref 1.010–1.025)
Urobilinogen, UA: 0.2 E.U./dL
pH, UA: 8 (ref 5.0–8.0)

## 2022-02-05 MED ORDER — QUETIAPINE FUMARATE 25 MG PO TABS: 25.0000 mg | ORAL_TABLET | Freq: Every day | ORAL | 1 refills | Status: DC

## 2022-02-05 MED ORDER — FAMOTIDINE 20 MG PO TABS: 20.0000 mg | ORAL_TABLET | Freq: Two times a day (BID) | ORAL | 0 refills | Status: DC | PRN

## 2022-02-05 NOTE — Progress Notes (Signed)
? ? ?Patient ID: [name], female    DOB: September 02, 1961, 61 y.o.   MRN: [id number] ? ?PCP: Steele Sizer, MD ? ?Chief Complaint  ?Patient presents with  ? Headache  ? Fatigue  ?  Pt has also felt nauseous with smell of food since Sunday.  ? ? ?Subjective:  ? ?[name] is a 61 y.o. female, presents to clinic with CC of the following: ? ?HPI  ?Acute sx onset 3-4 d ago with HA, nausea, fatigue ? ?She's been on pain meds for years, no change with those ?Recent surgery - incision doesn't look great, but no purulent drainage or spreading redness, she has surgical f/up in a month ? ?Ha to forehead eyes area ?Smell of food makes her nauseous  ? ?Out seroquel -no other med changes recently ?She denies any urinary symptoms, cough, abdominal pain, back pain ? ? ?Wt Readings from Last 5 Encounters:  ?02/05/22 185 lb 11.2 oz (84.2 kg)  ?01/02/22 203 lb (92.1 kg)  ?12/26/21 203 lb (92.1 kg)  ?12/01/21 200 lb 6.4 oz (90.9 kg)  ?11/06/21 204 lb 3.2 oz (92.6 kg)  ? ?BMI Readings from Last 5 Encounters:  ?02/05/22 28.24 kg/m?  ?01/02/22 30.87 kg/m?  ?12/26/21 30.87 kg/m?  ?12/01/21 30.47 kg/m?  ?11/06/21 31.05 kg/m?  ? ? ? ? ? ? ?Patient Active Problem List  ? Diagnosis Date Noted  ? Status post left breast lumpectomy 01/23/2021  ? History of total knee arthroplasty, right 04/17/2020  ? S/P TKR (total knee replacement) using cement, left 04/12/2019  ? Overweight (BMI 25.0-29.9) 04/06/2019  ? Peptic ulcer   ? Colon cancer screening   ? Benign neoplasm of ascending colon   ? Diverticulosis of large intestine without diverticulitis   ? Simple chronic bronchitis ([place]) 08/03/2018  ? Pancreatic lesion 04/21/2018  ? Abdominal aortic atherosclerosis ([place]) 04/21/2018  ? Acute peptic ulcer of stomach   ? Gastric polyp   ? Abdominal pain, epigastric   ? Trigger thumb of right hand 02/25/2018  ? Carpal tunnel syndrome on both sides 10/21/2017  ? CRP elevated 08/09/2017  ? Elevated C-reactive protein 07/28/2017  ? Osteoarthritis of  both knees 02/19/2016  ? COPD, mild ([place]) 05/08/2015  ? Vitamin D deficiency 05/07/2015  ? Metabolic syndrome 42/70/6237  ? Eczema 05/07/2015  ? Chronic insomnia 05/07/2015  ? Hypertension, benign 05/07/2015  ? Dyslipidemia 05/07/2015  ? Hyperglycemia 05/07/2015  ? Chronic low back pain 05/07/2015  ? IBS (irritable bowel syndrome) 05/07/2015  ? Gastroesophageal reflux disease without esophagitis 05/07/2015  ? Major depression, chronic ([place]) 05/07/2015  ? Menopausal syndrome (hot flashes) 05/07/2015  ? History of postmenopausal bleeding 01/14/2015  ? ? ? ? ?Current Outpatient Medications:  ?  amitriptyline (ELAVIL) 10 MG tablet, TAKE ONE TABLET BY MOUTH AT BEDTIME FOR IBS, Disp: 90 tablet, Rfl: 1 ?  amLODipine (NORVASC) 5 MG tablet, Take 1 tablet (5 mg total) by mouth daily., Disp: 90 tablet, Rfl: 1 ?  cholecalciferol (VITAMIN D) 1000 units tablet, Take 1,000 Units by mouth daily., Disp: , Rfl:  ?  desvenlafaxine (PRISTIQ) 50 MG 24 hr tablet, Take 1 tablet (50 mg total) by mouth daily., Disp: 90 tablet, Rfl: 1 ?  gabapentin (NEURONTIN) 300 MG capsule, Take 300 mg by mouth every 6 (six) hours as needed (Nerve pain)., Disp: , Rfl:  ?  losartan-hydrochlorothiazide (HYZAAR) 100-25 MG tablet, Take 1 tablet by mouth daily., Disp: 90 tablet, Rfl: 1 ?  meloxicam (MOBIC) 15 MG tablet, Take 15 mg  by mouth daily., Disp: , Rfl:  ?  metFORMIN (GLUCOPHAGE) 500 MG tablet, Take 1 tablet (500 mg total) by mouth daily with breakfast. (Patient taking differently: Take 500 mg by mouth once a week.), Disp: 90 tablet, Rfl: 1 ?  Multiple Vitamins-Minerals (MULTIVITAMIN WITH MINERALS) tablet, Take 1 tablet by mouth every other day., Disp: , Rfl:  ?  NARCAN 4 MG/0.1ML LIQD nasal spray kit, Place 1 spray into the nose once., Disp: , Rfl:  ?  omeprazole (PRILOSEC) 20 MG capsule, Take 1 capsule (20 mg total) by mouth daily., Disp: 90 capsule, Rfl: 1 ?  Oxycodone HCl 20 MG TABS, Take 20 mg by mouth every 4 (four) hours as needed (pain)., Disp: ,  Rfl:  ?  oxyCODONE-acetaminophen (PERCOCET) 5-325 MG tablet, Take 1-2 tablets by mouth every 6 (six) hours as needed for severe pain. Max 6 tabs per day, Disp: 30 tablet, Rfl: 0 ?  QUEtiapine (SEROQUEL) 25 MG tablet, Take 1 tablet (25 mg total) by mouth at bedtime., Disp: 90 tablet, Rfl: 1 ?  tiZANidine (ZANAFLEX) 4 MG tablet, Take 4 mg by mouth every 12 (twelve) hours., Disp: , Rfl:  ?  valACYclovir (VALTREX) 1000 MG tablet, TAKE ONE TABLET BY MOUTH TWICE DAILY (Patient taking differently: Take 1,000 mg by mouth daily as needed (Cold sores).), Disp: 6 tablet, Rfl: 1 ? ? ?No Known Allergies ? ? ?Social History  ? ?Tobacco Use  ? Smoking status: Former  ?  Packs/day: 1.00  ?  Years: 20.00  ?  Pack years: 20.00  ?  Types: Cigarettes  ?  Start date: 07/26/2002  ?  Quit date: 04/06/2019  ?  Years since quitting: 2.8  ? Smokeless tobacco: Never  ? Tobacco comments:  ?  Quit Date 04/06/2019  ?Vaping Use  ? Vaping Use: Never used  ?Substance Use Topics  ? Alcohol use: No  ?  Alcohol/week: 0.0 standard drinks  ? Drug use: No  ?  ? ? ?Chart Review Today: ?I personally reviewed active problem list, medication list, allergies, family history, social history, health maintenance, notes from last encounter, lab results, imaging with the patient/caregiver today. ? ? ?Review of Systems  ?Constitutional:  Positive for appetite change and fatigue. Negative for activity change, chills, diaphoresis, fever and unexpected weight change.  ?HENT: Negative.    ?Eyes: Negative.   ?Respiratory: Negative.  Negative for chest tightness, shortness of breath and wheezing.   ?Cardiovascular: Negative.  Negative for chest pain.  ?Gastrointestinal:  Positive for nausea. Negative for abdominal pain, blood in stool, constipation, diarrhea and vomiting.  ?Endocrine: Negative.   ?Genitourinary: Negative.   ?Musculoskeletal: Negative.   ?Skin: Negative.   ?Allergic/Immunologic: Negative.   ?Neurological: Negative.   ?Hematological: Negative.    ?Psychiatric/Behavioral: Negative.    ?All other systems reviewed and are negative. ? ?   ?Objective:  ? ?Vitals:  ? 02/05/22 0901  ?BP: 136/76  ?Pulse: 100  ?Resp: 16  ?Temp: 98.3 ?F (36.8 ?C)  ?TempSrc: Oral  ?SpO2: 98%  ?Weight: 185 lb 11.2 oz (84.2 kg)  ?Height: 5\' 8"  (1.727 m)  ?  ?Body mass index is 28.24 kg/m?. ? ?Physical Exam ?Vitals and nursing note reviewed.  ?Constitutional:   ?   General: She is not in acute distress. ?   Appearance: Normal appearance. She is well-developed. She is not ill-appearing, toxic-appearing or diaphoretic.  ?   Interventions: Face mask in place.  ?HENT:  ?   Head: Normocephalic and atraumatic.  ?  Right Ear: External ear normal.  ?   Left Ear: External ear normal.  ?Eyes:  ?   General: Lids are normal. No scleral icterus.    ?   Right eye: No discharge.     ?   Left eye: No discharge.  ?   Conjunctiva/sclera: Conjunctivae normal.  ?Neck:  ?   Trachea: Phonation normal. No tracheal deviation.  ?Cardiovascular:  ?   Rate and Rhythm: Normal rate and regular rhythm.  ?   Pulses: Normal pulses.     ?     Radial pulses are 2+ on the right side and 2+ on the left side.  ?     Posterior tibial pulses are 2+ on the right side and 2+ on the left side.  ?   Heart sounds: Normal heart sounds. No murmur heard. ?  No friction rub. No gallop.  ?Pulmonary:  ?   Effort: Pulmonary effort is normal. No respiratory distress.  ?   Breath sounds: Normal breath sounds. No stridor. No wheezing, rhonchi or rales.  ?Chest:  ?   Chest wall: No tenderness.  ?Abdominal:  ?   General: Bowel sounds are normal. There is no distension.  ?   Palpations: Abdomen is soft. There is no shifting dullness, hepatomegaly, splenomegaly or pulsatile mass.  ?   Tenderness: There is no abdominal tenderness. There is no right CVA tenderness or left CVA tenderness.  ?   Comments: Soft obese abd  ?Musculoskeletal:  ?   Right lower leg: No edema.  ?   Left lower leg: No edema.  ?Skin: ?   General: Skin is warm and dry.  ?    Coloration: Skin is not jaundiced or pale.  ?   Findings: No rash.  ?Neurological:  ?   Mental Status: She is alert. Mental status is at baseline.  ?   Cranial Nerves: Cranial nerves 2-12 are intact.  ?   Sensory: Einar Pheasant

## 2022-02-06 ENCOUNTER — Other Ambulatory Visit: Payer: Self-pay | Admitting: Family Medicine

## 2022-02-06 DIAGNOSIS — I4581 Long QT syndrome: Secondary | ICD-10-CM

## 2022-02-06 DIAGNOSIS — R110 Nausea: Secondary | ICD-10-CM

## 2022-02-06 LAB — TSH: TSH: 0.9 mIU/L (ref 0.40–4.50)

## 2022-02-06 LAB — COMPLETE METABOLIC PANEL WITH GFR
AG Ratio: 1.7 (calc) (ref 1.0–2.5)
ALT: 19 U/L (ref 6–29)
AST: 24 U/L (ref 10–35)
Albumin: 5 g/dL (ref 3.6–5.1)
Alkaline phosphatase (APISO): 106 U/L (ref 37–153)
BUN: 12 mg/dL (ref 7–25)
CO2: 29 mmol/L (ref 20–32)
Calcium: 10.6 mg/dL — ABNORMAL HIGH (ref 8.6–10.4)
Chloride: 98 mmol/L (ref 98–110)
Creat: 0.6 mg/dL (ref 0.50–1.03)
Globulin: 2.9 g/dL (calc) (ref 1.9–3.7)
Glucose, Bld: 112 mg/dL — ABNORMAL HIGH (ref 65–99)
Potassium: 3.5 mmol/L (ref 3.5–5.3)
Sodium: 140 mmol/L (ref 135–146)
Total Bilirubin: 0.6 mg/dL (ref 0.2–1.2)
Total Protein: 7.9 g/dL (ref 6.1–8.1)
eGFR: 103 mL/min/{1.73_m2} (ref >=60)

## 2022-02-06 LAB — CBC WITH DIFFERENTIAL/PLATELET
Absolute Monocytes: 685 cells/uL (ref 200–950)
Basophils Absolute: 55 cells/uL (ref 0–200)
Basophils Relative: 0.4 %
Eosinophils Absolute: 96 cells/uL (ref 15–500)
Eosinophils Relative: 0.7 %
HCT: 42.5 % (ref 35.0–45.0)
Hemoglobin: 13.9 g/dL (ref 11.7–15.5)
Lymphs Abs: 3028 cells/uL (ref 850–3900)
MCH: 30.8 pg (ref 27.0–33.0)
MCHC: 32.7 g/dL (ref 32.0–36.0)
MCV: 94 fL (ref 80.0–100.0)
MPV: 11.3 fL (ref 7.5–12.5)
Monocytes Relative: 5 %
Neutro Abs: 9837 cells/uL — ABNORMAL HIGH (ref 1500–7800)
Neutrophils Relative %: 71.8 %
Platelets: 414 10*3/uL — ABNORMAL HIGH (ref 140–400)
RBC: 4.52 10*6/uL (ref 3.80–5.10)
RDW: 11.9 % (ref 11.0–15.0)
Total Lymphocyte: 22.1 %
WBC: 13.7 10*3/uL — ABNORMAL HIGH (ref 3.8–10.8)

## 2022-02-06 LAB — URINE CULTURE
MICRO NUMBER:: 13232177
Result:: NO GROWTH
SPECIMEN QUALITY:: ADEQUATE

## 2022-02-06 MED ORDER — SCOPOLAMINE 1 MG/3DAYS TD PT72: 1.0000 | MEDICATED_PATCH | TRANSDERMAL | 0 refills | Status: DC

## 2022-02-09 ENCOUNTER — Telehealth: Payer: Self-pay

## 2022-02-09 NOTE — Telephone Encounter (Signed)
Spoke to pt about lab results.

## 2022-02-09 NOTE — Telephone Encounter (Signed)
Copied from [place] [phone]. Topic: General - Call Back - No Documentation >> Feb 06, 2022  4:20 PM Erick Blinks wrote: Reason for CRM: Pt called and is requesting to discuss her lab results with the clinic, please advise if [facility] may disclose.    [phone]

## 2022-02-11 ENCOUNTER — Telehealth: Payer: Self-pay

## 2022-02-11 NOTE — Telephone Encounter (Signed)
Copied from [place] [id number]. Topic: General - Other >> Feb 11, 2022 11:46 AM Pawlus, Brayton Layman A wrote: Reason for CRM: Pt requested a call back to go over her lab results, pt was advised of results already but had further questions, please advise.

## 2022-02-11 NOTE — Telephone Encounter (Signed)
Pt has been informed.

## 2022-02-11 NOTE — Telephone Encounter (Signed)
Called pt back and she wants to know what her urine culture. Advised pt provider has not reviewed it yet will give her a call once she does. ?

## 2022-02-13 DIAGNOSIS — M25551 Pain in right hip: Secondary | ICD-10-CM | POA: Diagnosis not present

## 2022-02-13 DIAGNOSIS — M79662 Pain in left lower leg: Secondary | ICD-10-CM | POA: Diagnosis not present

## 2022-02-13 DIAGNOSIS — M25512 Pain in left shoulder: Secondary | ICD-10-CM | POA: Diagnosis not present

## 2022-02-13 DIAGNOSIS — M5431 Sciatica, right side: Secondary | ICD-10-CM | POA: Diagnosis not present

## 2022-02-13 DIAGNOSIS — M79661 Pain in right lower leg: Secondary | ICD-10-CM | POA: Diagnosis not present

## 2022-02-13 DIAGNOSIS — M545 Low back pain, unspecified: Secondary | ICD-10-CM | POA: Diagnosis not present

## 2022-02-13 DIAGNOSIS — M25561 Pain in right knee: Secondary | ICD-10-CM | POA: Diagnosis not present

## 2022-02-13 DIAGNOSIS — M25552 Pain in left hip: Secondary | ICD-10-CM | POA: Diagnosis not present

## 2022-02-13 DIAGNOSIS — G894 Chronic pain syndrome: Secondary | ICD-10-CM | POA: Diagnosis not present

## 2022-02-13 DIAGNOSIS — M5432 Sciatica, left side: Secondary | ICD-10-CM | POA: Diagnosis not present

## 2022-02-13 DIAGNOSIS — M542 Cervicalgia: Secondary | ICD-10-CM | POA: Diagnosis not present

## 2022-02-13 DIAGNOSIS — Z79891 Long term (current) use of opiate analgesic: Secondary | ICD-10-CM | POA: Diagnosis not present

## 2022-02-13 DIAGNOSIS — M25511 Pain in right shoulder: Secondary | ICD-10-CM | POA: Diagnosis not present

## 2022-02-13 DIAGNOSIS — M25562 Pain in left knee: Secondary | ICD-10-CM | POA: Diagnosis not present

## 2022-02-16 ENCOUNTER — Other Ambulatory Visit: Payer: Self-pay | Admitting: Family Medicine

## 2022-02-16 DIAGNOSIS — B001 Herpesviral vesicular dermatitis: Secondary | ICD-10-CM

## 2022-02-16 NOTE — Telephone Encounter (Signed)
Medication Refill - Medication: valACYclovir (VALTREX) 1000 MG tablet  ? ?Has the patient contacted their pharmacy? Yes.   ?(Agent: If no, request that the patient contact the pharmacy for the refill. If patient does not wish to contact the pharmacy document the reason why and proceed with request.) ?(Agent: If yes, when and what did the pharmacy advise?) ? ?Preferred Pharmacy (with phone number or street name):  ?[facility] [address] [facility]  ?[place] [address]  ?Phone: [phone] Fax: [phone]  ? ?Has the patient been seen for an appointment in the last year OR does the patient have an upcoming appointment? Yes.   ? ?Agent: Please be advised that RX refills may take up to 3 business days. We ask that you follow-up with your pharmacy. ? ?

## 2022-02-17 ENCOUNTER — Other Ambulatory Visit: Payer: Self-pay | Admitting: Family Medicine

## 2022-02-17 DIAGNOSIS — I10 Essential (primary) hypertension: Secondary | ICD-10-CM

## 2022-02-17 MED ORDER — VALACYCLOVIR HCL 1 G PO TABS: 1000.0000 mg | ORAL_TABLET | Freq: Two times a day (BID) | ORAL | 2 refills | Status: DC

## 2022-02-17 NOTE — Telephone Encounter (Signed)
Requested Prescriptions  ?Pending Prescriptions Disp Refills  ?? valACYclovir (VALTREX) 1000 MG tablet 6 tablet 1  ?  Sig: Take 1 tablet (1,000 mg total) by mouth 2 (two) times daily.  ?  ? Antimicrobials:  Antiviral Agents - Anti-Herpetic Passed - 02/16/2022  5:26 PM  ?  ?  Passed - Valid encounter within last 12 months  ?  Recent Outpatient Visits   ?      ? 1 week ago Acute nonintractable headache, unspecified headache type  ? [facility] Delsa Grana, [place]  ? 8 months ago Need for vaccination for pneumococcus  ? [facility], Drue Stager, [place]  ? 1 year ago Lipoma of anterior chest wall  ? [facility] [place], Drue Stager, [place]  ? 1 year ago COPD, mild [facility])  ? [facility] [place], Drue Stager, [place]  ? 2 years ago Major depression, recurrent, chronic ([place])  ? [facility] Steele Sizer, MD  ?  ?  ?Future Appointments   ?        ? In 8 months [place]   ?  ? ?  ?  ?  ? ?

## 2022-02-18 ENCOUNTER — Emergency Department: Payer: Medicare Other

## 2022-02-18 ENCOUNTER — Observation Stay: Payer: Medicare Other

## 2022-02-18 ENCOUNTER — Observation Stay
Admission: EM | Admit: 2022-02-18 | Discharge: 2022-02-20 | Disposition: A | Discharge status: Home Health | Payer: Medicare Other | Attending: Internal Medicine | Admitting: Internal Medicine

## 2022-02-18 ENCOUNTER — Other Ambulatory Visit: Payer: Self-pay

## 2022-02-18 DIAGNOSIS — R7303 Prediabetes: Secondary | ICD-10-CM | POA: Diagnosis present

## 2022-02-18 DIAGNOSIS — R001 Bradycardia, unspecified: Secondary | ICD-10-CM | POA: Diagnosis present

## 2022-02-18 DIAGNOSIS — E8881 Metabolic syndrome: Secondary | ICD-10-CM | POA: Diagnosis present

## 2022-02-18 DIAGNOSIS — E876 Hypokalemia: Secondary | ICD-10-CM | POA: Diagnosis present

## 2022-02-18 DIAGNOSIS — R4781 Slurred speech: Secondary | ICD-10-CM | POA: Diagnosis not present

## 2022-02-18 DIAGNOSIS — Z96653 Presence of artificial knee joint, bilateral: Secondary | ICD-10-CM | POA: Diagnosis not present

## 2022-02-18 DIAGNOSIS — Z87891 Personal history of nicotine dependence: Secondary | ICD-10-CM | POA: Diagnosis not present

## 2022-02-18 DIAGNOSIS — E669 Obesity, unspecified: Secondary | ICD-10-CM | POA: Diagnosis present

## 2022-02-18 DIAGNOSIS — I639 Cerebral infarction, unspecified: Secondary | ICD-10-CM | POA: Diagnosis not present

## 2022-02-18 DIAGNOSIS — J449 Chronic obstructive pulmonary disease, unspecified: Secondary | ICD-10-CM | POA: Diagnosis present

## 2022-02-18 DIAGNOSIS — K279 Peptic ulcer, site unspecified, unspecified as acute or chronic, without hemorrhage or perforation: Secondary | ICD-10-CM | POA: Diagnosis present

## 2022-02-18 DIAGNOSIS — I10 Essential (primary) hypertension: Secondary | ICD-10-CM | POA: Diagnosis not present

## 2022-02-18 DIAGNOSIS — F329 Major depressive disorder, single episode, unspecified: Secondary | ICD-10-CM

## 2022-02-18 DIAGNOSIS — R29810 Facial weakness: Secondary | ICD-10-CM | POA: Diagnosis not present

## 2022-02-18 DIAGNOSIS — Z7984 Long term (current) use of oral hypoglycemic drugs: Secondary | ICD-10-CM | POA: Diagnosis not present

## 2022-02-18 DIAGNOSIS — G459 Transient cerebral ischemic attack, unspecified: Principal | ICD-10-CM | POA: Diagnosis present

## 2022-02-18 DIAGNOSIS — I5033 Acute on chronic diastolic (congestive) heart failure: Secondary | ICD-10-CM | POA: Diagnosis not present

## 2022-02-18 DIAGNOSIS — D649 Anemia, unspecified: Secondary | ICD-10-CM | POA: Diagnosis not present

## 2022-02-18 DIAGNOSIS — Z79899 Other long term (current) drug therapy: Secondary | ICD-10-CM | POA: Insufficient documentation

## 2022-02-18 DIAGNOSIS — E785 Hyperlipidemia, unspecified: Secondary | ICD-10-CM | POA: Diagnosis not present

## 2022-02-18 DIAGNOSIS — R471 Dysarthria and anarthria: Secondary | ICD-10-CM | POA: Diagnosis not present

## 2022-02-18 DIAGNOSIS — E119 Type 2 diabetes mellitus without complications: Secondary | ICD-10-CM | POA: Diagnosis not present

## 2022-02-18 DIAGNOSIS — F339 Major depressive disorder, recurrent, unspecified: Secondary | ICD-10-CM | POA: Diagnosis present

## 2022-02-18 DIAGNOSIS — I959 Hypotension, unspecified: Secondary | ICD-10-CM | POA: Diagnosis not present

## 2022-02-18 DIAGNOSIS — I110 Hypertensive heart disease with heart failure: Secondary | ICD-10-CM | POA: Diagnosis not present

## 2022-02-18 DIAGNOSIS — G8929 Other chronic pain: Secondary | ICD-10-CM | POA: Diagnosis present

## 2022-02-18 DIAGNOSIS — E1165 Type 2 diabetes mellitus with hyperglycemia: Secondary | ICD-10-CM | POA: Diagnosis not present

## 2022-02-18 LAB — DIFFERENTIAL
Abs Immature Granulocytes: 0.02 10*3/uL (ref 0.00–0.07)
Basophils Absolute: 0 10*3/uL (ref 0.0–0.1)
Basophils Relative: 0 %
Eosinophils Absolute: 0.1 10*3/uL (ref 0.0–0.5)
Eosinophils Relative: 2 %
Immature Granulocytes: 0 %
Lymphocytes Relative: 41 %
Lymphs Abs: 2.8 10*3/uL (ref 0.7–4.0)
Monocytes Absolute: 0.5 10*3/uL (ref 0.1–1.0)
Monocytes Relative: 8 %
Neutro Abs: 3.4 10*3/uL (ref 1.7–7.7)
Neutrophils Relative %: 49 %

## 2022-02-18 LAB — IRON AND TIBC
Iron: 37 ug/dL (ref 28–170)
Saturation Ratios: 14 % (ref 10.4–31.8)
TIBC: 266 ug/dL (ref 250–450)
UIBC: 229 ug/dL

## 2022-02-18 LAB — COMPREHENSIVE METABOLIC PANEL
ALT: 16 U/L (ref 0–44)
AST: 29 U/L (ref 15–41)
Albumin: 3.4 g/dL — ABNORMAL LOW (ref 3.5–5.0)
Alkaline Phosphatase: 82 U/L (ref 38–126)
Anion gap: 9 (ref 5–15)
BUN: 6 mg/dL (ref 6–20)
CO2: 26 mmol/L (ref 22–32)
Calcium: 8.6 mg/dL — ABNORMAL LOW (ref 8.9–10.3)
Chloride: 105 mmol/L (ref 98–111)
Creatinine, Ser: 0.87 mg/dL (ref 0.44–1.00)
GFR, Estimated: >60 mL/min (ref >60)
Glucose, Bld: 119 mg/dL — ABNORMAL HIGH (ref 70–99)
Potassium: 3.1 mmol/L — ABNORMAL LOW (ref 3.5–5.1)
Sodium: 140 mmol/L (ref 135–145)
Total Bilirubin: 0.3 mg/dL (ref 0.3–1.2)
Total Protein: 6.3 g/dL — ABNORMAL LOW (ref 6.5–8.1)

## 2022-02-18 LAB — CBC
HCT: 30.8 % — ABNORMAL LOW (ref 36.0–46.0)
Hemoglobin: 9.8 g/dL — ABNORMAL LOW (ref 12.0–15.0)
MCH: 30.2 pg (ref 26.0–34.0)
MCHC: 31.8 g/dL (ref 30.0–36.0)
MCV: 94.8 fL (ref 80.0–100.0)
Platelets: 355 10*3/uL (ref 150–400)
RBC: 3.25 MIL/uL — ABNORMAL LOW (ref 3.87–5.11)
RDW: 13.1 % (ref 11.5–15.5)
WBC: 7 10*3/uL (ref 4.0–10.5)
nRBC: 0 % (ref 0.0–0.2)

## 2022-02-18 LAB — FOLATE: Folate: 17.8 ng/mL (ref >5.9)

## 2022-02-18 LAB — ETHANOL: Alcohol, Ethyl (B): <10 mg/dL (ref <10)

## 2022-02-18 LAB — RETICULOCYTES
Immature Retic Fract: 23.6 % — ABNORMAL HIGH (ref 2.3–15.9)
RBC.: 3.16 MIL/uL — ABNORMAL LOW (ref 3.87–5.11)
Retic Count, Absolute: 63.5 10*3/uL (ref 19.0–186.0)
Retic Ct Pct: 2 % (ref 0.4–3.1)

## 2022-02-18 LAB — HEMOGLOBIN A1C
Hgb A1c MFr Bld: 6.4 % — ABNORMAL HIGH (ref 4.8–5.6)
Mean Plasma Glucose: 136.98 mg/dL

## 2022-02-18 LAB — PROTIME-INR
INR: 1 (ref 0.8–1.2)
Prothrombin Time: 13.5 seconds (ref 11.4–15.2)

## 2022-02-18 LAB — HIV ANTIBODY (ROUTINE TESTING W REFLEX): HIV Screen 4th Generation wRfx: NONREACTIVE

## 2022-02-18 LAB — MAGNESIUM: Magnesium: 1.8 mg/dL (ref 1.7–2.4)

## 2022-02-18 LAB — VITAMIN B12: Vitamin B-12: 318 pg/mL (ref 180–914)

## 2022-02-18 LAB — FERRITIN: Ferritin: 52 ng/mL (ref 11–307)

## 2022-02-18 LAB — APTT: aPTT: 30 seconds (ref 24–36)

## 2022-02-18 LAB — GLUCOSE, CAPILLARY: Glucose-Capillary: 121 mg/dL — ABNORMAL HIGH (ref 70–99)

## 2022-02-18 MED ORDER — SODIUM CHLORIDE 0.9 % IV BOLUS (SEPSIS)
1000.0000 mL | Freq: Once | INTRAVENOUS | Status: AC
  Administered 2022-02-18: 1000 mL via INTRAVENOUS

## 2022-02-18 MED ORDER — OXYCODONE HCL 5 MG PO TABS
20.0000 mg | ORAL_TABLET | ORAL | Status: DC | PRN
  Administered 2022-02-18 – 2022-02-20 (×8): 20 mg via ORAL
  Filled 2022-02-18 (×8): qty 4

## 2022-02-18 MED ORDER — STROKE: EARLY STAGES OF RECOVERY BOOK: Freq: Once | Status: DC

## 2022-02-18 MED ORDER — ENOXAPARIN SODIUM 40 MG/0.4ML IJ SOSY
40.0000 mg | PREFILLED_SYRINGE | INTRAMUSCULAR | Status: DC
  Administered 2022-02-18 – 2022-02-19 (×2): 40 mg via SUBCUTANEOUS
  Filled 2022-02-18 (×2): qty 0.4

## 2022-02-18 MED ORDER — ALBUTEROL SULFATE (2.5 MG/3ML) 0.083% IN NEBU: 2.5000 mg | INHALATION_SOLUTION | RESPIRATORY_TRACT | Status: DC | PRN

## 2022-02-18 MED ORDER — AMITRIPTYLINE HCL 10 MG PO TABS
10.0000 mg | ORAL_TABLET | Freq: Every day | ORAL | Status: DC
Start: 2022-02-18 — End: 2022-02-20
  Administered 2022-02-18 – 2022-02-19 (×2): 10 mg via ORAL
  Filled 2022-02-18 (×2): qty 1

## 2022-02-18 MED ORDER — VENLAFAXINE HCL ER 37.5 MG PO CP24
37.5000 mg | ORAL_CAPSULE | Freq: Every day | ORAL | Status: DC
Start: 2022-02-19 — End: 2022-02-20
  Administered 2022-02-19 – 2022-02-20 (×2): 37.5 mg via ORAL
  Filled 2022-02-18 (×2): qty 1

## 2022-02-18 MED ORDER — SODIUM CHLORIDE 0.9% FLUSH: 3.0000 mL | Freq: Once | INTRAVENOUS | Status: DC

## 2022-02-18 MED ORDER — ONDANSETRON HCL 4 MG/2ML IJ SOLN: 4.0000 mg | Freq: Three times a day (TID) | INTRAMUSCULAR | Status: DC | PRN

## 2022-02-18 MED ORDER — ASPIRIN EC 81 MG PO TBEC
81.0000 mg | DELAYED_RELEASE_TABLET | Freq: Every day | ORAL | Status: DC
  Administered 2022-02-19 – 2022-02-20 (×2): 81 mg via ORAL
  Filled 2022-02-18 (×2): qty 1

## 2022-02-18 MED ORDER — TIZANIDINE HCL 4 MG PO TABS
4.0000 mg | ORAL_TABLET | Freq: Three times a day (TID) | ORAL | Status: DC
  Administered 2022-02-18 – 2022-02-20 (×6): 4 mg via ORAL
  Filled 2022-02-18 (×6): qty 1

## 2022-02-18 MED ORDER — ADULT MULTIVITAMIN W/MINERALS CH
1.0000 | ORAL_TABLET | ORAL | Status: DC
  Administered 2022-02-19: 1 via ORAL
  Filled 2022-02-18: qty 1

## 2022-02-18 MED ORDER — VITAMIN D 25 MCG (1000 UNIT) PO TABS
1000.0000 [IU] | ORAL_TABLET | Freq: Every day | ORAL | Status: DC
  Administered 2022-02-19 – 2022-02-20 (×2): 1000 [IU] via ORAL
  Filled 2022-02-18 (×2): qty 1

## 2022-02-18 MED ORDER — PANTOPRAZOLE SODIUM 40 MG PO TBEC
40.0000 mg | DELAYED_RELEASE_TABLET | Freq: Every day | ORAL | Status: DC
  Administered 2022-02-19 – 2022-02-20 (×2): 40 mg via ORAL
  Filled 2022-02-18 (×2): qty 1

## 2022-02-18 MED ORDER — VALACYCLOVIR HCL 500 MG PO TABS: 1000.0000 mg | ORAL_TABLET | Freq: Two times a day (BID) | ORAL | Status: DC

## 2022-02-18 MED ORDER — ASPIRIN 81 MG PO CHEW
324.0000 mg | CHEWABLE_TABLET | Freq: Once | ORAL | Status: AC
  Administered 2022-02-18: 324 mg via ORAL
  Filled 2022-02-18: qty 4

## 2022-02-18 MED ORDER — POTASSIUM CHLORIDE CRYS ER 20 MEQ PO TBCR
40.0000 meq | EXTENDED_RELEASE_TABLET | Freq: Once | ORAL | Status: AC
  Administered 2022-02-18: 40 meq via ORAL
  Filled 2022-02-18: qty 2

## 2022-02-18 MED ORDER — HYDRALAZINE HCL 20 MG/ML IJ SOLN: 5.0000 mg | INTRAMUSCULAR | Status: DC | PRN

## 2022-02-18 MED ORDER — GABAPENTIN 300 MG PO CAPS
300.0000 mg | ORAL_CAPSULE | Freq: Four times a day (QID) | ORAL | Status: DC | PRN
  Administered 2022-02-19 – 2022-02-20 (×2): 300 mg via ORAL
  Filled 2022-02-18 (×2): qty 1

## 2022-02-18 MED ORDER — QUETIAPINE FUMARATE 25 MG PO TABS
25.0000 mg | ORAL_TABLET | Freq: Every day | ORAL | Status: DC
  Administered 2022-02-18 – 2022-02-19 (×2): 25 mg via ORAL
  Filled 2022-02-18 (×2): qty 1

## 2022-02-18 MED ORDER — SENNOSIDES-DOCUSATE SODIUM 8.6-50 MG PO TABS: 1.0000 | ORAL_TABLET | Freq: Every evening | ORAL | Status: DC | PRN

## 2022-02-18 MED ORDER — ACETAMINOPHEN 160 MG/5ML PO SOLN
650.0000 mg | ORAL | Status: DC | PRN
  Filled 2022-02-18: qty 20.3

## 2022-02-18 MED ORDER — ACETAMINOPHEN 650 MG RE SUPP: 650.0000 mg | RECTAL | Status: DC | PRN

## 2022-02-18 MED ORDER — ACETAMINOPHEN 325 MG PO TABS: 650.0000 mg | ORAL_TABLET | ORAL | Status: DC | PRN

## 2022-02-18 MED ORDER — ATORVASTATIN CALCIUM 20 MG PO TABS
40.0000 mg | ORAL_TABLET | Freq: Every day | ORAL | Status: DC
Start: 2022-02-18 — End: 2022-02-20
  Administered 2022-02-18 – 2022-02-20 (×3): 40 mg via ORAL
  Filled 2022-02-18 (×3): qty 2

## 2022-02-18 MED ORDER — SODIUM CHLORIDE 0.9 % IV SOLN
INTRAVENOUS | Status: DC
Start: 2022-02-18 — End: 2022-02-19

## 2022-02-18 NOTE — Assessment & Plan Note (Signed)
-   Continue home as needed oxycodone 

## 2022-02-18 NOTE — Assessment & Plan Note (Addendum)
Facial droop has resolved after arrival to emergency room.  MRI/MRA negative.  Carotid Dopplers and echocardiogram unremarkable.  Started on aspirin and Lipitor, although certainly concerning for possibility of bleed, see below.  Neurology following.  PT and OT recommending outpatient physical therapy ?

## 2022-02-18 NOTE — Telephone Encounter (Signed)
Lft with daughter and she said that pt was on the way to her cousin funeral and they called to see where she was at and that they could not understand her so they had her pull over and they called the EMS and she is now at the [facility]

## 2022-02-18 NOTE — Assessment & Plan Note (Signed)
Stable. ?-prn albuterol ?

## 2022-02-18 NOTE — Assessment & Plan Note (Signed)
-   Started Lipitor 40 mg daily ?

## 2022-02-18 NOTE — Assessment & Plan Note (Addendum)
Already on home PPI.  We will continue. ?

## 2022-02-18 NOTE — Progress Notes (Signed)
?   02/18/22 1340  ?Clinical Encounter Type  ?Visited With Patient;Family;Health care provider  ?Visit Type Initial;Spiritual support;Social support;Code  ? ?Chaplain Burris checked-in with Pt. Chaplain offered compassionate, non-anxious presence and inquired about her well-being. Checked-in with Jacob Moores, RN. Pt shared that she had family who may be waiting. Learned from Pt's aunt, Hoyle Sauer, that they had all actually bee in route to a family funeral. Bonney Roussel spent time with family to understand their needs and also offered hospitality and prayer at their request. ?

## 2022-02-18 NOTE — Consult Note (Signed)
?                    NEURO HOSPITALIST CONSULT NOTE  ? ?Requestig physician: Dr. Joni Fears ? ?Reason for Consult: Acute onset of left sided numbness and weakness ? ?History obtained from:  Patient and Chart    ? ?HPI:                                                                                                                                         ? [name] is an 61 y.o. female with PMHx as noted below who presented to the ED via EMS as a Code Stroke after she noticed stroke-like symptoms of left facial droop, slurred speech, left sided numbness and weakness beginning while she was driving to a funeral at 1230 today. Her aunt told the patient to pull over and call 911 for assistance. CBG was 121. She was slightly hypotensive. On arrival she did not appear to have any facial droop but did appear drowsy and with slurred speech, suggestive of sedation. Patient did endorse taking muscle relaxers and pain medication but denied taking more than prescribed. She was noted to have pinpoint pupils on arrival to the ED. She did complain of a mild headache. Denied vision changes, trauma, falling or balance/coordination deficits.  ? ?Past Medical History:  ?Diagnosis Date  ? Abdominal aortic atherosclerosis ([place])   ? Anemia   ? Arthritis   ? bilateral knees  ? Bronchitis   ? Carpal tunnel syndrome, bilateral   ? Contact dermatitis and other eczema, due to unspecified cause   ? COPD (chronic obstructive pulmonary disease) ([place])   ? Depressive disorder   ? Diverticulosis   ? Dysmetabolic syndrome X   ? Eczema   ? GERD (gastroesophageal reflux disease)   ? Hyperlipidemia   ? Hypertension   ? IBS (irritable bowel syndrome)   ? Insomnia   ? Lumbago   ? Nonspecific abnormal electrocardiogram (ECG) (EKG)   ? Osteoarthritis of both knees   ? Other ovarian failure(256.39)   ? Peptic ulcer   ? Postmenopausal atrophic vaginitis   ? Symptomatic menopausal or female climacteric states   ? Unspecified vitamin D deficiency    ? ? ?Past Surgical History:  ?Procedure Laterality Date  ? ACHILLES TENDON SURGERY Left 01/02/2022  ? Procedure: ACHILLES TENDON REPAIR-SECONDARY;  Surgeon: Samara Deist, DPM;  Location: [facility] ORS;  Service: Podiatry;  Laterality: Left;  ? BACK SURGERY    ? BREAST EXCISIONAL BIOPSY Left ?  ? neg  ? BREAST LUMPECTOMY Left 01/08/2021  ? Procedure: BREAST LUMPECTOMY;  Surgeon: Ronny Bacon, MD;  Location: [facility] ORS;  Service: General;  Laterality: Left;  ? CARPAL TUNNEL RELEASE Left 08/15/2018  ? Procedure: CARPAL TUNNEL RELEASE;  Surgeon: Earnestine Leys, MD;  Location: [facility] ORS;  Service: Orthopedics;  Laterality: Left;  ? CERVICAL DISCECTOMY    ?  x 2; metal plate  ? CHOLECYSTECTOMY    ? COLONOSCOPY WITH PROPOFOL N/A 08/08/2018  ? Procedure: COLONOSCOPY WITH PROPOFOL;  Surgeon: Virgel Manifold, MD;  Location: [facility] ENDOSCOPY;  Service: Endoscopy;  Laterality: N/A;  ? COLONOSCOPY WITH PROPOFOL N/A 12/01/2021  ? Procedure: COLONOSCOPY WITH PROPOFOL;  Surgeon: Lin Landsman, MD;  Location: [facility] END[facility]COPY;  Service: Gastroenterology;  Laterality: N/A;  ? DILATATION & CURETTAGE/HYSTEROSCOPY WITH MYOSURE N/A 03/21/2015  ? Procedure: DILATATION & CURETTAGE/HYSTEROSCOPY;  Surgeon: Aletha Halim, MD;  Location: [facility] ORS;  Service: Gynecology;  Laterality: N/A;  ? DILATION AND CURETTAGE OF UTERUS    ? ENDOMETRIAL ABLATION    ? ESOPHAGOGASTRODUODENOSCOPY (EGD) WITH PROPOFOL N/A 04/13/2018  ? Procedure: ESOPHAGOGASTRODUODENOSCOPY (EGD) WITH PROPOFOL;  Surgeon: Virgel Manifold, MD;  Location: [facility] ENDOSCOPY;  Service: Endoscopy;  Laterality: N/A;  ? ESOPHAGOGASTRODUODENOSCOPY (EGD) WITH PROPOFOL N/A 08/08/2018  ? Procedure: ESOPHAGOGASTRODUODENOSCOPY (EGD) WITH PROPOFOL;  Surgeon: Virgel Manifold, MD;  Location: [facility] ENDOSCOPY;  Service: Endoscopy;  Laterality: N/A;  ? JOINT REPLACEMENT    ? OSTECTOMY Left 01/02/2022  ? Procedure: CALCANEAL EXOSTECTOMY;  Surgeon: Samara Deist, DPM;  Location: [facility] OR[facility];  Service:  Podiatry;  Laterality: Left;  ? SPINAL FUSION    ? lumbar x2  ? TONSILLECTOMY    ? TOTAL KNEE ARTHROPLASTY Left 04/12/2019  ? Procedure: TOTAL KNEE ARTHROPLASTY;  Surgeon: Lovell Sheehan, MD;  Location: [facility] OR[facility];  Service: Orthopedics;  Laterality: Left;  ? TOTAL KNEE ARTHROPLASTY Right 04/17/2020  ? Procedure: TOTAL KNEE ARTHROPLASTY;  Surgeon: Lovell Sheehan, MD;  Location: [facility] OR[facility];  Service: Orthopedics;  Laterality: Right;  ? TUBAL LIGATION    ? ? ?Family History  ?Problem Relation Age of Onset  ? Heart disease Mother   ? Thyroid cancer Mother   ? Congestive Heart Failure Mother   ? Depression Daughter   ? Asthma Daughter   ? Breast cancer Maternal Grandmother   ?          ? ?Social History:  reports that she quit smoking about 2 years ago. Her smoking use included cigarettes. She started smoking about 19 years ago. She has a 20.00 pack-year smoking history. She has never used smokeless tobacco. She reports that she does not drink alcohol and does not use drugs. ? ?No Known Allergies ? ?HOME MEDICATIONS:                                                                                                                     ? ?No current facility-administered medications on file prior to encounter.  ? ?Current Outpatient Medications on File Prior to Encounter  ?Medication Sig Dispense Refill  ? amitriptyline (ELAVIL) 10 MG tablet TAKE ONE TABLET BY MOUTH AT BEDTIME FOR IBS 90 tablet 1  ? amLODipine (NORVASC) 5 MG tablet Take 1 tablet (5 mg total) by mouth daily. 90 tablet 1  ? cholecalciferol (VITAMIN D) 1000 units tablet Take 1,000 Units by mouth daily.    ? desvenlafaxine (PRISTIQ)  50 MG 24 hr tablet Take 1 tablet (50 mg total) by mouth daily. 90 tablet 1  ? famotidine (PEPCID) 20 MG tablet Take 1 tablet (20 mg total) by mouth 2 (two) times daily as needed for heartburn or indigestion. 60 tablet 0  ? gabapentin (NEURONTIN) 300 MG capsule Take 300 mg by mouth every 6 (six) hours as needed (Nerve pain).    ?  losartan-hydrochlorothiazide (HYZAAR) 100-25 MG tablet TAKE ONE TABLET BY MOUTH ONCE DAILY 90 tablet 0  ? meloxicam (MOBIC) 15 MG tablet Take 15 mg by mouth daily.    ? metFORMIN (GLUCOPHAGE) 500 MG tablet Take 1 tablet (500 mg total) by mouth daily with breakfast. (Patient taking differently: Take 500 mg by mouth once a week.) 90 tablet 1  ? Multiple Vitamins-Minerals (MULTIVITAMIN WITH MINERALS) tablet Take 1 tablet by mouth every other day.    ? NARCAN 4 MG/0.1ML LIQD nasal spray kit Place 1 spray into the nose once.    ? omeprazole (PRILOSEC) 20 MG capsule Take 1 capsule (20 mg total) by mouth daily. 90 capsule 1  ? Oxycodone HCl 20 MG TABS Take 20 mg by mouth every 4 (four) hours as needed (pain).    ? oxyCODONE-acetaminophen (PERCOCET) 5-325 MG tablet Take 1-2 tablets by mouth every 6 (six) hours as needed for severe pain. Max 6 tabs per day 30 tablet 0  ? QUEtiapine (SEROQUEL) 25 MG tablet Take 1 tablet (25 mg total) by mouth at bedtime. 90 tablet 1  ? scopolamine (TRANSDERM-SCOP) 1 MG/3DAYS Place 1 patch (1.5 mg total) onto the skin every 3 (three) days. As needed for nausea.  Remove patch and apply new one after 3 days if still nauseous 4 patch 0  ? valACYclovir (VALTREX) 1000 MG tablet Take 1 tablet (1,000 mg total) by mouth 2 (two) times daily. 6 tablet 2  ? ? ? ?ROS:                                                                                                                                       ?Has had nausea with decreased appetite for approximately the past 2 weeks. Other ROS as per HPI. Comprehensive ROS deferred due to acuity of presentation.  ? ? ?BP (!) 88/66 (BP Location: Right Arm)   Pulse (!) 56   Temp 98.4 ?F (36.9 ?C) (Oral)   Resp 17   Wt 97.9 kg   SpO2 95%   BMI 32.81 kg/m?  ? ? ? ?General Examination:                                                                                                      ? ?  Physical Exam  ?HEENT-  [place]/AT   ?Lungs- Respirations unlabored ?Extremities-  Left lower leg with brace on s/p recent surgery ? ?Neurological Examination ?Mental Status: Awake with sedated affect. Decreased attention. Speech is slightly dysarthric, with a quality suggestive of sedation, but fluent

## 2022-02-18 NOTE — Assessment & Plan Note (Signed)
-   Patient is taking metformin weekly ?

## 2022-02-18 NOTE — ED Provider Notes (Addendum)
[facility] Provider Note    Event Date/Time   First MD Initiated Contact with Patient 02/18/22 1344     (approximate)   History   Code Stroke   HPI  [name] is a 61 y.o. female with a past medical history of hypertension and diabetes who was in her usual state of health until 12:20 PM today when she had sudden onset of facial droop and slurred speech.  Denies recent head injury but does complain of some mild headache.  No change in vision or balance or coordination.  No falls or trauma.  No lower extremity weakness or paresthesia.     Physical Exam   Triage Vital Signs: ED Triage Vitals  Enc Vitals Group     BP 02/18/22 1405 (!) 88/66     Pulse Rate 02/18/22 1405 (!) 56     Resp 02/18/22 1405 17     Temp 02/18/22 1405 98.4 F (36.9 C)     Temp Source 02/18/22 1405 Oral     SpO2 02/18/22 1405 95 %     Weight 02/18/22 1408 215 lb 12.8 oz (97.9 kg)     Height --      Head Circumference --      Peak Flow --      Pain Score 02/18/22 1349 0     Pain Loc --      Pain Edu? --      Excl. in [place]? --     Most recent vital signs: Vitals:   02/18/22 1405 02/18/22 1408  BP: (!) 88/66 (!) 88/66  Pulse: (!) 56 (!) 57  Resp: 17 18  Temp: 98.4 F (36.9 C)   SpO2: 95% 95%     General: Awake, no distress.  CV:  Good peripheral perfusion.  Regular rate and rhythm Resp:  Normal effort.  Clear to auscultation bilaterally Abd:  No distention.  Soft nontender Other:  Cranial nerves III through XII intact on arrival to ED.  No drift.  Speech somewhat dysarthric.   ED Results / Procedures / Treatments   Labs (all labs ordered are listed, but only abnormal results are displayed) Labs Reviewed  CBC - Abnormal; Notable for the following components:      Result Value   RBC 3.25 (*)    Hemoglobin 9.8 (*)    HCT 30.8 (*)    All other components within normal limits  COMPREHENSIVE METABOLIC PANEL - Abnormal; Notable for the following components:    Potassium 3.1 (*)    Glucose, Bld 119 (*)    Calcium 8.6 (*)    Total Protein 6.3 (*)    Albumin 3.4 (*)    All other components within normal limits  PROTIME-INR  APTT  DIFFERENTIAL  CBG MONITORING, ED     EKG Interpreted by me Sinus rhythm rate of 57.  Normal axis and intervals.  Normal QRS ST segments and T waves.    RADIOLOGY CT head viewed and interpreted by me, negative for intracranial hemorrhage or mass.  Radiology report reviewed, negative for acute infarct.    PROCEDURES:  Critical Care performed: No  Procedures   MEDICATIONS ORDERED IN ED: Medications  sodium chloride flush (NS) 0.9 % injection 3 mL (has no administration in time range)  aspirin chewable tablet 324 mg (has no administration in time range)     IMPRESSION / MDM / ASSESSMENT AND PLAN / ED COURSE  I reviewed the triage vital signs and the nursing notes.  Differential diagnosis includes, but is not limited to, intracranial hemorrhage, intracranial mass, ischemic stroke, TIA, electrolyte abnormality, dehydration, vagal episode  Patient presents with acute onset of slurred speech and facial droop.  Symptoms appear to be improved and very minor by the time the patient has arrived to the ED.  CT is negative.  Patient sent under code stroke protocol immediately to CT with stroke liaison at bedside.  Discussed with neurology Dr. Cheral Marker who recommends TIA workup, not a candidate for TNK or endovascular intervention.       FINAL CLINICAL IMPRESSION(S) / ED DIAGNOSES   Final diagnoses:  TIA (transient ischemic attack)  Type 2 diabetes mellitus without complication, without long-term current use of insulin ([place])     Rx / DC Orders   ED Discharge Orders     None        Note:  This document was prepared using Dragon voice recognition software and may include unintentional dictation errors.   Carrie Mew, MD 02/18/22 1418    Carrie Mew,  MD 03/26/22 2356

## 2022-02-18 NOTE — Assessment & Plan Note (Signed)
Potassium 3.1 ?-Repleted with potassium ?-Check a magnesium level ?

## 2022-02-18 NOTE — ED Notes (Signed)
Pt presents to ED as a code stroke with c/o of facial droop and slurred speech by EMS on arrival pt did not appear to have any facial droop but does appear drowsy and with slurred speech. Pt states aunt told pt to pull over and call 911 for assistance. Pt states she was driving to her aunts funeral. Pt is A&Ox4 on arrival, CBG 121. Pt does take muscle relaxer's and pain medication but denies taking more than Rx'ed, pt does have pinpoint pupils on arrival. Pt does not have any pronator drift, pt has some slurred speech and pt states sensation difference on the L side during neurologists exam. Pt c/o of L sided weakness but has equal strengths and grips to both sides.  ? ?Pt slightly hypotensive but reports HX of hypertension and states she takes BP meds.  ?

## 2022-02-18 NOTE — Assessment & Plan Note (Signed)
-   Continue home medications 

## 2022-02-18 NOTE — Assessment & Plan Note (Addendum)
-  IV prn hydralazine SBP>220 or dBP>110 ?-Hold home amlodipine and Hyzaar due to hypotension ?

## 2022-02-18 NOTE — ED Triage Notes (Addendum)
Pt presents to ED via [facility] with c/o of stroke like s/s that started at 1230 today.  ? ?CBG 121 ?

## 2022-02-18 NOTE — H&P (Signed)
?History and Physical  ? ? ?[name] MWN:[id number] DOB: [birth year] DOA: 02/18/2022 ? ?Referring MD/NP/PA:  ? ?PCP: Steele Sizer, MD  ? ?Patient coming from:  The patient is coming from home.  At baseline, pt is independent for most of ADL.       ? ?Chief Complaint: Slurred speech, right facial droop ? ?HPI: [name] is a 61 y.o. female with medical history significant of hypertension, hyperlipidemia, COPD, GERD, depression, PUD, lumbago, IBS, anemia, metabolic syndrome, chronic pain, who presents with slurred speech, right facial droop. ? ?Per her daughter at the bedside, pt was in her usual state of health until 12:20 PM today when she had sudden onset of facial droop and slurred speech. Her facial droop has resolved after arrival to the ED.  She still has slurred speech.  No unilateral numbness or tinglings in extremities.  No vision loss or hearing loss.  Patient has mild shortness breath due to COPD, no cough, chest pain, fever or chills.  Patient has nausea, no vomiting, diarrhea or abdominal pain.  No symptoms of UTI.  Patient's hemoglobin dropped from 13.9 on 4/6 to 9.8 today.  Patient denies any rectal bleeding or dark stool. ? ?Initially patient had blood pressure 88/66, which improved to 102/68 after giving 1 L normal saline bolus in ED. ? ?Data Reviewed and ED Course: pt was found to have WBC 7.0, INR 1.0, PTT 30, potassium 3.1, GFR> 60, temperature normal, heart rate 57, RR 18, oxygen saturation 95% on room air.  CT of head is negative for acute intracranial abnormalities.  Patient is placed on MedSurg bed for observation.  Dr. Cheral Marker of neurology is consulted. ? ? ?EKG:  Not done in ED, will get one.    ? ? ?Review of Systems:  ? ?General: no fevers, chills, no body weight gain, has fatigue ?HEENT: no blurry vision, hearing changes or sore throat ?Respiratory: has dyspnea, no coughing, wheezing ?CV: no chest pain, no palpitations ?GI: has nausea, no vomiting, abdominal pain, diarrhea,  constipation ?GU: no dysuria, burning on urination, increased urinary frequency, hematuria  ?Ext: no leg edema ?Neuro: no vision change or hearing loss. Has slurred speech and right facial droop ?Skin: no rash, no skin tear. ?MSK: No muscle spasm, no deformity, no limitation of range of movement in spin ?Heme: No easy bruising.  ?Travel history: No recent long distant travel. ? ? ?Allergy: No Known Allergies ? ?Past Medical History:  ?Diagnosis Date  ? Abdominal aortic atherosclerosis ([place])   ? Anemia   ? Arthritis   ? bilateral knees  ? Bronchitis   ? Carpal tunnel syndrome, bilateral   ? Contact dermatitis and other eczema, due to unspecified cause   ? COPD (chronic obstructive pulmonary disease) ([place])   ? Depressive disorder   ? Diverticulosis   ? Dysmetabolic syndrome X   ? Eczema   ? GERD (gastroesophageal reflux disease)   ? Hyperlipidemia   ? Hypertension   ? IBS (irritable bowel syndrome)   ? Insomnia   ? Lumbago   ? Nonspecific abnormal electrocardiogram (ECG) (EKG)   ? Osteoarthritis of both knees   ? Other ovarian failure(256.39)   ? Peptic ulcer   ? Postmenopausal atrophic vaginitis   ? Symptomatic menopausal or female climacteric states   ? Unspecified vitamin D deficiency   ? ? ?Past Surgical History:  ?Procedure Laterality Date  ? ACHILLES TENDON SURGERY Left 01/02/2022  ? Procedure: ACHILLES TENDON REPAIR-SECONDARY;  Surgeon: Samara Deist, DPM;  Location: [facility] ORS;  Service: Podiatry;  Laterality: Left;  ? BACK SURGERY    ? BREAST EXCISIONAL BIOPSY Left ?  ? neg  ? BREAST LUMPECTOMY Left 01/08/2021  ? Procedure: BREAST LUMPECTOMY;  Surgeon: Ronny Bacon, MD;  Location: [facility] ORS;  Service: General;  Laterality: Left;  ? CARPAL TUNNEL RELEASE Left 08/15/2018  ? Procedure: CARPAL TUNNEL RELEASE;  Surgeon: Earnestine Leys, MD;  Location: [facility] ORS;  Service: Orthopedics;  Laterality: Left;  ? CERVICAL DISCECTOMY    ? x 2; metal plate  ? CHOLECYSTECTOMY    ? COLONOSCOPY WITH PROPOFOL N/A 08/08/2018  ?  Procedure: COLONOSCOPY WITH PROPOFOL;  Surgeon: Virgel Manifold, MD;  Location: [facility] ENDOSCOPY;  Service: Endoscopy;  Laterality: N/A;  ? COLONOSCOPY WITH PROPOFOL N/A 12/01/2021  ? Procedure: COLONOSCOPY WITH PROPOFOL;  Surgeon: Lin Landsman, MD;  Location[facility] ENDOSCOPY;  Service: Gastroenterology;  Laterality: N/A;  ? DILATATION & CURETTAGE/HYSTEROSCOPY WITH MYOSURE N/A 03/21/2015  ? Procedure: DILATATION & CURETTAGE/HYSTEROSCOPY;  Surgeon: Aletha Halim, MD;  Location: [facility] ORS;  Service: Gynecology;  Laterality: N/A;  ? DILATION AND CURETTAGE OF UTERUS    ? ENDOMETRIAL ABLATION    ? ESOPHAGOGASTRODUODENOSCOPY (EGD) WITH PROPOFOL N/A 04/13/2018  ? Procedure: ESOPHAGOGASTRODUODENOSCOPY (EGD) WITH PROPOFOL;  Surgeon: Virgel Manifold, MD;  Location: [facility] ENDOSCOPY;  Service: Endoscopy;  Laterality: N/A;  ? ESOPHAGOGASTRODUODENOSCOPY (EGD) WITH PROPOFOL N/A 08/08/2018  ? Procedure: ESOPHAGOGASTRODUODENOSCOPY (EGD) WITH PROPOFOL;  Surgeon: Virgel Manifold, MD;  Location: [facility] ENDOSCOPY;  Service: Endoscopy;  Laterality: N/A;  ? JOINT REPLACEMENT    ? OSTECTOMY Left 01/02/2022  ? Procedure: CALCANEAL EXOSTECTOMY;  Surgeon: Samara Deist, DPM;  Location: [facility] ORS;  Service: Podiatry;  Laterality: Left;  ? SPINAL FUSION    ? lumbar x2  ? TONSILLECTOMY    ? TOTAL KNEE ARTHROPLASTY Left 04/12/2019  ? Procedure: TOTAL KNEE ARTHROPLASTY;  Surgeon: Lovell Sheehan, MD;  Location: [facility] ORS;  Service: Orthopedics;  Laterality: Left;  ? TOTAL KNEE ARTHROPLASTY Right 04/17/2020  ? Procedure: TOTAL KNEE ARTHROPLASTY;  Surgeon: Lovell Sheehan, MD;  Location: [facility] ORS;  Service: Orthopedics;  Laterality: Right;  ? TUBAL LIGATION    ? ? ?Social History:  reports that she quit smoking about 2 years ago. Her smoking use included cigarettes. She started smoking about 19 years ago. She has a 20.00 pack-year smoking history. She has never used smokeless tobacco. She reports that she does not drink alcohol and does not  use drugs. ? ?Family History:  ?Family History  ?Problem Relation Age of Onset  ? Heart disease Mother   ? Thyroid cancer Mother   ? Congestive Heart Failure Mother   ? Depression Daughter   ? Asthma Daughter   ? Breast cancer Maternal Grandmother   ?  ? ?Prior to Admission medications   ?Medication Sig Start Date End Date Taking? Authorizing Provider  ?amitriptyline (ELAVIL) 10 MG tablet TAKE ONE TABLET BY MOUTH AT BEDTIME FOR IBS 06/04/21   Ancil Boozer, Drue Stager, MD  ?amLODipine (NORVASC) 5 MG tablet Take 1 tablet (5 mg total) by mouth daily. 06/04/21   Steele Sizer, MD  ?cholecalciferol (VITAMIN D) 1000 units tablet Take 1,000 Units by mouth daily.    [provider]  ?desvenlafaxine (PRISTIQ) 50 MG 24 hr tablet Take 1 tablet (50 mg total) by mouth daily. 06/04/21   Steele Sizer, MD  ?famotidine (PEPCID) 20 MG tablet Take 1 tablet (20 mg total) by mouth 2 (two) times daily as needed for heartburn or  indigestion. 02/05/22   Delsa Grana, PA-C  ?gabapentin (NEURONTIN) 300 MG capsule Take 300 mg by mouth every 6 (six) hours as needed (Nerve pain).    Shanon Ace, MD  ?losartan-hydrochlorothiazide Konrad Penta) 100-25 MG tablet TAKE ONE TABLET BY MOUTH ONCE DAILY 02/18/22   Steele Sizer, MD  ?meloxicam (MOBIC) 15 MG tablet Take 15 mg by mouth daily. 07/09/20   [provider]  ?metFORMIN (GLUCOPHAGE) 500 MG tablet Take 1 tablet (500 mg total) by mouth daily with breakfast. ?Patient taking differently: Take 500 mg by mouth once a week. 06/04/21   Steele Sizer, MD  ?Multiple Vitamins-Minerals (MULTIVITAMIN WITH MINERALS) tablet Take 1 tablet by mouth every other day.    [provider]  ?NARCAN 4 MG/0.1ML LIQD nasal spray kit Place 1 spray into the nose once. 03/14/19   Shanon Ace, MD  ?omeprazole (PRILOSEC) 20 MG capsule Take 1 capsule (20 mg total) by mouth daily. 06/04/21   Steele Sizer, MD  ?Oxycodone HCl 20 MG TABS Take 20 mg by mouth every 4 (four) hours as needed (pain).  07/16/20   [facility]  ?oxyCODONE-acetaminophen (PERCOCET) 5-325 MG tablet Take 1-2 tablets by mouth every 6 (six) hours as needed for severe pain. Max 6 tabs per day 01/02/22   Samara Deist,

## 2022-02-18 NOTE — Assessment & Plan Note (Addendum)
Hemoglobin dropped from 13.9 on 02/05/2022 --> 9.8.  Patient denies dark stool or rectal bleeding.  Highly suspicious for GI bleed given hypotension on admission.  Patient just had a colonoscopy done few months ago.  GI consulted in not recommending scope at this time.  Outpatient follow-up.  Hemoglobin improved to 10.3 on its own. ?

## 2022-02-19 ENCOUNTER — Observation Stay (HOSPITAL_BASED_OUTPATIENT_CLINIC_OR_DEPARTMENT_OTHER)
Admit: 2022-02-19 | Discharge: 2022-02-19 | Disposition: A | Discharge status: Home / Self Care | Payer: Medicare Other | Attending: Internal Medicine | Admitting: Internal Medicine

## 2022-02-19 ENCOUNTER — Observation Stay: Payer: Medicare Other

## 2022-02-19 ENCOUNTER — Observation Stay: Admit: 2022-02-19 | Payer: Medicare Other

## 2022-02-19 DIAGNOSIS — K5900 Constipation, unspecified: Secondary | ICD-10-CM | POA: Diagnosis not present

## 2022-02-19 DIAGNOSIS — R110 Nausea: Secondary | ICD-10-CM | POA: Diagnosis not present

## 2022-02-19 DIAGNOSIS — G459 Transient cerebral ischemic attack, unspecified: Secondary | ICD-10-CM

## 2022-02-19 DIAGNOSIS — D649 Anemia, unspecified: Secondary | ICD-10-CM | POA: Diagnosis not present

## 2022-02-19 DIAGNOSIS — I5033 Acute on chronic diastolic (congestive) heart failure: Secondary | ICD-10-CM | POA: Clinically undetermined

## 2022-02-19 DIAGNOSIS — Z9049 Acquired absence of other specified parts of digestive tract: Secondary | ICD-10-CM | POA: Diagnosis not present

## 2022-02-19 DIAGNOSIS — R7303 Prediabetes: Secondary | ICD-10-CM | POA: Diagnosis present

## 2022-02-19 DIAGNOSIS — R001 Bradycardia, unspecified: Secondary | ICD-10-CM | POA: Diagnosis present

## 2022-02-19 DIAGNOSIS — E669 Obesity, unspecified: Secondary | ICD-10-CM | POA: Diagnosis not present

## 2022-02-19 LAB — BASIC METABOLIC PANEL
Anion gap: 5 (ref 5–15)
BUN: 6 mg/dL (ref 6–20)
CO2: 29 mmol/L (ref 22–32)
Calcium: 8.5 mg/dL — ABNORMAL LOW (ref 8.9–10.3)
Chloride: 105 mmol/L (ref 98–111)
Creatinine, Ser: 0.59 mg/dL (ref 0.44–1.00)
GFR, Estimated: >60 mL/min (ref >60)
Glucose, Bld: 107 mg/dL — ABNORMAL HIGH (ref 70–99)
Potassium: 3.8 mmol/L (ref 3.5–5.1)
Sodium: 139 mmol/L (ref 135–145)

## 2022-02-19 LAB — LIPID PANEL
Cholesterol: 120 mg/dL (ref 0–200)
HDL: 34 mg/dL — ABNORMAL LOW (ref >40)
LDL Cholesterol: 34 mg/dL (ref 0–99)
Total CHOL/HDL Ratio: 3.5 RATIO
Triglycerides: 258 mg/dL — ABNORMAL HIGH (ref <150)
VLDL: 52 mg/dL — ABNORMAL HIGH (ref 0–40)

## 2022-02-19 LAB — CBC
HCT: 32.7 % — ABNORMAL LOW (ref 36.0–46.0)
Hemoglobin: 10.3 g/dL — ABNORMAL LOW (ref 12.0–15.0)
MCH: 30.4 pg (ref 26.0–34.0)
MCHC: 31.5 g/dL (ref 30.0–36.0)
MCV: 96.5 fL (ref 80.0–100.0)
Platelets: 316 10*3/uL (ref 150–400)
RBC: 3.39 MIL/uL — ABNORMAL LOW (ref 3.87–5.11)
RDW: 13 % (ref 11.5–15.5)
WBC: 6.2 10*3/uL (ref 4.0–10.5)
nRBC: 0 % (ref 0.0–0.2)

## 2022-02-19 LAB — ECHOCARDIOGRAM COMPLETE
AR max vel: 2.13 cm2
AV Area VTI: 2.14 cm2
AV Area mean vel: 2.35 cm2
AV Mean grad: 6 mmHg
AV Peak grad: 15.4 mmHg
Ao pk vel: 1.96 m/s
Area-P 1/2: 2.74 cm2
Height: 68 in
MV VTI: 1.97 cm2
S' Lateral: 2.71 cm
Weight: 3452.8 oz

## 2022-02-19 LAB — BRAIN NATRIURETIC PEPTIDE: B Natriuretic Peptide: 271.9 pg/mL — ABNORMAL HIGH (ref 0.0–100.0)

## 2022-02-19 MED ORDER — PHENOL 1.4 % MT LIQD
1.0000 | OROMUCOSAL | Status: DC | PRN
  Administered 2022-02-19: 1 via OROMUCOSAL
  Filled 2022-02-19: qty 177

## 2022-02-19 NOTE — Progress Notes (Addendum)
SLP Cancellation Note ? ?Patient Details ?Name: [name] ?MRN: [id number] ?DOB: Jul 16, 1961 ? ? ?Cancelled treatment:       Reason Eval/Treat Not Completed: SLP screened, no needs identified, will sign off ? ?Chart review and SLP spoke with RN. MRA and MRA of brain are unremarkable. Pt A&Ox4 with no concerns for speech, language, cognitive, or swallowing difficulty.  ? ?SLP to sign off at this time. RN aware and in agreement with the above. ? ?Cherrie Gauze, M.S., CCC-SLP ?Speech-Language Pathologist ?[facility] ?[phone] ([place]) ?   ?Quintella Baton ?02/19/2022, 11:29 AM ?

## 2022-02-19 NOTE — Assessment & Plan Note (Signed)
A1c at 6.4.  Already on metformin. ?

## 2022-02-19 NOTE — Evaluation (Signed)
Occupational Therapy Evaluation ?Patient Details ?Name: [name] ?MRN: [id number] ?DOB: 07-Nov-1960 ?Today's Date: 02/19/2022 ? ? ?History of Present Illness 61 y.o. female admitted 4/19 was found to have slurred speech and R facial droop.  Cleared with MRI for stroke, has lower hgb, had good echo results, and no significant findings from carotid US.  PMHx:   hypertension, hyperlipidemia, COPD, GERD, depression, PUD, lumbago, IBS, anemia, metabolic syndrome, chronic pain,  ? ?Clinical Impression ?  ?Pt was seen for OT evaluation this date. Prior to hospital admission, pt was driving on her way to her cousin's son's funeral when she began feeling bad and was "whoozy behind my eyes and all over the road."  Pt lives with her spouse in a 2 story home but can live on the main floor. Generally independent at baseline however recently using crutches and wearing a post-op boot on L foot from surgery on 01/02/22. Currently pt demonstrates impairments as described below (See OT problem list) which functionally limit her ability to perform ADL/self-care tasks at baseline independence. Pt currently requires increased time/effort to complete ADL, CGA for ADL transfers and ADL mobility in the room, using a RW (family is bringing her crutches). She does endorse impaired sensation on L side of face and 4th and 5th digits of L hand. Endorses blurry vision but endorses this is not new and she typically wears glasses but doesn't have them right now. Pt would benefit from skilled OT services to address noted impairments and functional limitations (see below for any additional details) in order to maximize safety and independence while minimizing falls risk and caregiver burden. Upon hospital discharge, recommend HHOT to maximize pt safety and return to functional independence during meaningful occupations of daily life.   ? ?Recommendations for follow up therapy are one component of a multi-disciplinary discharge planning process, led  by the attending physician.  Recommendations may be updated based on patient status, additional functional criteria and insurance authorization.  ? ?Follow Up Recommendations ? Home health OT  ?  ?Assistance Recommended at Discharge Intermittent Supervision/Assistance  ?Patient can return home with the following A little help with walking and/or transfers;Assistance with cooking/housework;Assist for transportation;Direct supervision/assist for medications management;Help with stairs or ramp for entrance ? ?  ?Functional Status Assessment ? Patient has had a recent decline in their functional status and demonstrates the ability to make significant improvements in function in a reasonable and predictable amount of time.  ?Equipment Recommendations ? None recommended by OT  ?  ?Recommendations for Other Services   ? ? ?  ?Precautions / Restrictions Precautions ?Precautions: Fall ?Precaution Comments: monitor for LOB ?Restrictions ?Weight Bearing Restrictions: No  ? ?  ? ?Mobility Bed Mobility ?Overal bed mobility: Modified Independent ?  ?  ?  ?  ?  ?  ?  ?  ? ?Transfers ?Overall transfer level: Needs assistance ?Equipment used: None ?Transfers: Sit to/from Stand ?Sit to Stand: Min guard ?  ?  ?  ?  ?  ?  ?  ? ?  ?Balance Overall balance assessment: Needs assistance ?Sitting-balance support: Feet supported ?Sitting balance-Leahy Scale: Fair ?  ?  ?Standing balance support: Single extremity supported, During functional activity, Bilateral upper extremity supported, No upper extremity supported ?Standing balance-Leahy Scale: Fair ?Standing balance comment: better with UE support ?  ?  ?  ?  ?  ?  ?  ?  ?  ?  ?  ?   ? ?ADL either performed or assessed with clinical judgement  ? ?  ADL Overall ADL's : Modified independent ?  ?  ?  ?  ?  ?  ?  ?  ?  ?  ?  ?  ?  ?  ?  ?  ?  ?  ?  ?General ADL Comments: increased time/effort but no overt difficulty  ? ? ? ?Vision Patient Visual Report: Blurring of vision ?Additional Comments:  pt reports blurry vision in both eyes but has been going on for a while, helps to close one eye, endorses wearing glasses and doesn't have them currently  ?   ?Perception   ?  ?Praxis   ?  ? ?Pertinent Vitals/Pain Pain Assessment ?Pain Assessment: 0-10 ?Pain Score: 5  ?Pain Location: headache ?Pain Descriptors / Indicators: Headache ?Pain Intervention(s): Limited activity within patient's tolerance, Repositioned  ? ? ? ?Hand Dominance Right ?  ?Extremity/Trunk Assessment Upper Extremity Assessment ?Upper Extremity Assessment: LUE deficits/detail ?LUE Deficits / Details: tingling in LUE with impaired sensation primarily in 4th and 5th digits ?LUE Sensation: decreased light touch ?  ?Lower Extremity Assessment ?Lower Extremity Assessment: Generalized weakness ?  ?Cervical / Trunk Assessment ?Cervical / Trunk Assessment: Kyphotic (mild) ?  ?Communication Communication ?Communication: No difficulties ?  ?Cognition Arousal/Alertness: Awake/alert ?Behavior During Therapy: [facility] for tasks assessed/performed ?Overall Cognitive Status: Within Functional Limits for tasks assessed ?  ?  ?  ?  ?  ?  ?  ?  ?  ?  ?  ?  ?  ?  ?  ?  ?  ?  ?  ?General Comments  pt is able to maneuver her IV pole to walk, but is hindered by the extra equipment.  Will anticipate a removal of the IV pole and SPC at most will cover her needs, which pt has at home ? ?  ?Exercises   ?  ?Shoulder Instructions    ? ? ?Home Living Family/patient expects to be discharged to:: Private residence ?Living Arrangements: Alone ?Available Help at Discharge: Family;Friend(s);Available PRN/intermittently ?Type of Home: House ?Home Access: Stairs to enter ?Entrance Stairs-Number of Steps: 4 ?Entrance Stairs-Rails: Right;Left ?Home Layout: Two level;Able to live on main level with bedroom/bathroom ?  ?  ?  ?  ?Bathroom Toilet: Handicapped height ?  ?  ?Home Equipment: Grab bars - tub/shower;BSC/3in1;Rolling Walker (2 wheels);Cane - single point;Crutches ?  ?  ?  ? ?   ?Prior Functioning/Environment Prior Level of Function : Independent/Modified Independent;Driving ?  ?  ?  ?  ?  ?  ?Mobility Comments: has not used AD since TKA; except since she had heel surgery for a bone spur on 01/02/22 and has been using crutches since then ?ADLs Comments: I for all self care ?  ? ?  ?  ?OT Problem List: Impaired balance (sitting and/or standing);Decreased knowledge of use of DME or AE;Impaired vision/perception;Decreased strength;Impaired UE functional use ?  ?   ?OT Treatment/Interventions: Self-care/ADL training;Therapeutic exercise;Therapeutic activities;Neuromuscular education;DME and/or AE instruction;Patient/family education;Balance training  ?  ?OT Goals(Current goals can be found in the care plan section) Acute Rehab OT Goals ?Patient Stated Goal: go home and feel better ?OT Goal Formulation: With patient ?Time For Goal Achievement: 03/05/22 ?Potential to Achieve Goals: Good ?ADL Goals ?Pt Will Transfer to Toilet: with modified independence;ambulating (LRAD, L boot) ?Additional ADL Goal #1: Pt will complete fine motor table top IADL activities such as med mgt with modified independence  ?OT Frequency: Min 2X/week ?  ? ?Co-evaluation   ?  ?  ?  ?  ? ?  ?  AM-PAC OT "6 Clicks" Daily Activity     ?Outcome Measure Help from another person eating meals?: None ?Help from another person taking care of personal grooming?: None ?Help from another person toileting, which includes using toliet, bedpan, or urinal?: A Little ?Help from another person bathing (including washing, rinsing, drying)?: A Little ?Help from another person to put on and taking off regular upper body clothing?: None ?Help from another person to put on and taking off regular lower body clothing?: A Little ?6 Click Score: 21 ?  ?End of Session   ? ?Activity Tolerance: Patient tolerated treatment well ?Patient left: in bed;with call bell/phone within reach ? ?OT Visit Diagnosis: Other abnormalities of gait and mobility  (R26.89);Muscle weakness (generalized) (M62.81);Hemiplegia and hemiparesis ?Hemiplegia - Right/Left: Left ?Hemiplegia - dominant/non-dominant: Non-Dominant ?Hemiplegia - caused by: Unspecified  ?              ?Time:

## 2022-02-19 NOTE — Progress Notes (Signed)
*  PRELIMINARY RESULTS* ?Echocardiogram ?2D Echocardiogram has been performed. ? ?[name] ?02/19/2022, 11:56 AM ?

## 2022-02-19 NOTE — Progress Notes (Signed)
SLP Cancellation Note ? ?Patient Details ?Name: [name] ?MRN: [id number] ?DOB: [birth year] ? ? ?Cancelled treatment:       Reason Eval/Treat Not Completed: Patient at procedure or test/unavailable  ? ?SLP consult received and appreciated. Chart review completed. Pt currently OTF for carotid US. Will continue efforts as appropriate. ? ?Cherrie Gauze, M.S., CCC-SLP ?Speech-Language Pathologist ?[facility] ?[phone] ([place])  ? ?Quintella Baton ?02/19/2022, 8:08 AM ?

## 2022-02-19 NOTE — Assessment & Plan Note (Signed)
Meets criteria with BMI greater than 30 ?

## 2022-02-19 NOTE — Progress Notes (Signed)
Secure chat with Dr Maryland Pink and Velna Hatchet RN re PIV request.  Dr [place] Pink replies ok to leave PIV out for now due to no IV meds or IV Fluids ordered at this time. ?

## 2022-02-19 NOTE — Progress Notes (Signed)
[facility] Progress Note ? ?Patient: [name]    TKP:[id number]  DOA: 02/18/2022    ?Date of Service: the patient was seen and examined on 02/19/2022 ? ?Brief hospital course: ?61 year old female with past medical history of 60 year old female with past medical history of hypertension, obesity, COPD and hyperlipidemia presented to the emergency room on 4/19 with right facial droop that had resolved after arrival to the emergency room.  Initial CT scan of head unremarkable.  Also of concern was patient's hemoglobin which was 9.8 with a normal MCV, but was 13.9 13 days prior.  On arrival to the emergency room, patient was hypotensive with a systolic in the 51Z.  Brought in for further evaluation.  Neurology consulted and MRI/MRA unremarkable. ? ?Assessment and Plan: ?Assessment and Plan: ?* TIA (transient ischemic attack) ?Facial droop has resolved after arrival to emergency room.  MRI/MRA negative.  Carotid Dopplers and echocardiogram unremarkable.  Started on aspirin and Lipitor, although certainly concerning for possibility of bleed, see below.  Neurology following.  PT and OT recommending home health PT ? ?Acute on chronic diastolic CHF (congestive heart failure) ([place]) ?Echocardiogram noted grade 2 diastolic dysfunction.  She has been getting IV fluids for her blood pressure.  Elevated BNP on 4/20 afternoon elevated at 270.  Have stopped IV fluids.  Unclear if she was volume overloaded prior to coming in although I doubt this given her hypotension.  We will try some gentle Lasix ? ?Normocytic anemia ?Hemoglobin dropped from 13.9 on 02/05/2022 --> 9.8.  Patient denies dark stool or rectal bleeding.  Highly suspicious for GI bleed given hypotension on admission.  Patient just had a colonoscopy done few months ago.  Consult GI. ? ?COPD, mild ([place]) ?Stable. ?-prn albuterol ? ?Peptic ulcer ?Already on home PPI.  We will continue. ? ?Prediabetes ?A1c at 6.4.  Already on metformin. ? ?Dyslipidemia ?- Started  Lipitor 40 mg daily ? ?Sinus bradycardia ?Heart rate has been in the 40s to 50s.  Not on any chronotropic agents.  TSH checked 2 weeks ago and was normal. ? ?Major depression, chronic ([place]) ?- Continue home medications ? ?Chronic pain ?- Continue home as needed oxycodone ? ?Hypokalemia ?Potassium 3.1 ?-Repleted with potassium ?-Check a magnesium level ? ?Hypertension, benign ?-IV prn hydralazine SBP>220 or dBP>110 ?-Hold home amlodipine and Hyzaar due to hypotension ? ?Metabolic syndrome ?- Patient is taking metformin weekly ? ?Obesity (BMI 30-39.9) ?Meets criteria with BMI greater than 30. ? ? ? ? ? ? ?Body mass index is 32.81 kg/m?.  ?  ?   ? ?Consultants: ?Neurology ?Gastroenterology ? ?Procedures: ?Echocardiogram: Grade 2 diastolic dysfunction ? ?Antimicrobials: ?None ? ?Code Status: Full code ? ? ?Subjective: Patient still feels little weak and woozy.  No appetite gets nauseated when she tries to eat. ? ?Objective: ?Blood pressure is elevated following IV fluids.  Noted mild bradycardia. ?Vitals:  ? 02/19/22 1202 02/19/22 1645  ?BP: (!) 161/92 (!) 182/85  ?Pulse: 63 70  ?Resp: 17 17  ?Temp: 98.2 ?F (36.8 ?C) 98.2 ?F (36.8 ?C)  ?SpO2: 97% 96%  ? ?No intake or output data in the 24 hours ending 02/19/22 1649 ?Filed Weights  ? 02/18/22 1408  ?Weight: 97.9 kg  ? ?Body mass index is 32.81 kg/m?. ? ?Exam: ? ?General: Alert and oriented x3, no acute distress ?HEENT: Normocephalic atraumatic, mucous membranes are slightly dry ?Cardiovascular: Regular rhythm, bradycardic ?Respiratory: Clear to auscultation bilaterally ?Abdomen: Soft, nontender, nondistended, positive bowel sounds ?Musculoskeletal: No clubbing or cyanosis, trace pitting  edema ?Skin: No skin breaks, tears or lesions ?Psychiatry: Appropriate, no evidence of psychoses ?Neurology: No focal deficits ? ?Data Reviewed: ?Labs reviewed.  Noted BNP of 270. ? ?Disposition:  ?Status is: Observation ? ?  ? ?Anticipated discharge date: 4/21 ? ?Remaining issues to be  resolved so that patient can be discharged: GI evaluation ? ? ?Family Communication: Updated son and sister by phone ?DVT Prophylaxis: ?enoxaparin (LOVENOX) injection 40 mg Start: 02/18/22 2200 ? ? ? ?Author: ?Annita Brod ,MD ?02/19/2022 4:49 PM ? ?To reach On-call, see care teams to locate the attending and reach out via [email or web address]. ?Between 7PM-7AM, please contact night-coverage ?If you still have difficulty reaching the attending provider, please page the [facility] (Director on Call) for [facility] on amion for assistance. ? ?

## 2022-02-19 NOTE — Progress Notes (Signed)
OT Cancellation Note ? ?Patient Details ?Name: [name] ?MRN: [id number] ?DOB: 1961/07/28 ? ? ?Cancelled Treatment:    Reason Eval/Treat Not Completed: Other (comment). Consult received, chart reviewed. First attempt, pt just got breakfast tray. 2nd attempt, getting echo. Will re-attempt at later time as pt is available.  ? ?Ardeth Perfect., MPH, MS, OTR/L ?ascom [phone] ?02/19/22, 12:45 PM ? ?

## 2022-02-19 NOTE — Evaluation (Signed)
Physical Therapy Evaluation ?Patient Details ?Name: [name] ?MRN: [id number] ?DOB: [birth year] ?Today's Date: 02/19/2022 ? ?History of Present Illness ? 61 y.o. female admitted 4/19 was found to have slurred speech and R facial droop.  Cleared with MRI for stroke, has lower hgb, had good echo results, and no significant findings from carotid US.  PMHx:   hypertension, hyperlipidemia, COPD, GERD, depression, PUD, lumbago, IBS, anemia, metabolic syndrome, chronic pain,  ?Clinical Impression ? Pt was seen for initial evaluation with findings of weakness, mild balance changes and will need to practice ascending steps to enter her home.  Pt was referred to hosp by family for being dysarthric, so pt has family to assist her at home.  PT will expect her to receive RW if hers is in disrepair.  Pt has received information about her current strength and balance, and will focus on standing endurance, control of LE's for higher level balance challenges, and to increase LE strength to improve distances and safety for steps.  Follow acutely for goals as outlined below.     ?   ? ?Recommendations for follow up therapy are one component of a multi-disciplinary discharge planning process, led by the attending physician.  Recommendations may be updated based on patient status, additional functional criteria and insurance authorization. ? ?Follow Up Recommendations Home health PT ? ?  ?Assistance Recommended at Discharge Intermittent Supervision/Assistance  ?Patient can return home with the following ? A little help with walking and/or transfers;A little help with bathing/dressing/bathroom;Assistance with cooking/housework;Help with stairs or ramp for entrance;Assist for transportation ? ?  ?Equipment Recommendations Rolling walker (2 wheels)  ?Recommendations for Other Services ?    ?  ?Functional Status Assessment Patient has had a recent decline in their functional status and demonstrates the ability to make significant  improvements in function in a reasonable and predictable amount of time.  ? ?  ?Precautions / Restrictions Precautions ?Precautions: Fall ?Precaution Comments: monitor for LOB ?Restrictions ?Weight Bearing Restrictions: No  ? ?  ? ?Mobility ? Bed Mobility ?Overal bed mobility: Modified Independent ?  ?  ?  ?  ?  ?  ?General bed mobility comments: supine to sit and back with supervised help for safety ?  ? ?Transfers ?Overall transfer level: Needs assistance ?Equipment used: 1 person hand held assist ?Transfers: Sit to/from Stand ?Sit to Stand: Min guard ?  ?  ?  ?  ?  ?General transfer comment: min guard for safety ?  ? ?Ambulation/Gait ?Ambulation/Gait assistance: Min guard ?Gait Distance (Feet): 35 Feet ?Assistive device: IV Pole ?Gait Pattern/deviations: Step-through pattern, Decreased stride length ?Gait velocity: reduced ?Gait velocity interpretation: <1.31 ft/sec, indicative of household ambulator ?Pre-gait activities: standing balance ck and safety with lines ?General Gait Details: walk in her room with pt noting fatigue from being up but willing to walk since she had to run to BR ? ?Stairs ?  ?  ?  ?  ?  ? ?Wheelchair Mobility ?  ? ?Modified Rankin (Stroke Patients Only) ?  ? ?  ? ?Balance Overall balance assessment: Needs assistance ?Sitting-balance support: Feet supported ?Sitting balance-Leahy Scale: Fair ?  ?  ?Standing balance support: Single extremity supported, During functional activity ?Standing balance-Leahy Scale: Fair ?  ?  ?  ?  ?  ?  ?  ?  ?  ?  ?  ?  ?   ? ? ? ?Pertinent Vitals/Pain Pain Assessment ?Pain Assessment: Faces ?Faces Pain Scale: No hurt  ? ? ?Home Living Family/patient  expects to be discharged to:: Private residence ?Living Arrangements: Alone ?Available Help at Discharge: Family;Friend(s);Available PRN/intermittently ?Type of Home: House ?Home Access: Stairs to enter ?Entrance Stairs-Rails: Right;Left ?Entrance Stairs-Number of Steps: 4 ?  ?Home Layout: Two level;Able to live on  main level with bedroom/bathroom ?Home Equipment: Grab bars - tub/shower;BSC/3in1;Rolling Walker (2 wheels);Cane - single point;Crutches ?   ?  ?Prior Function Prior Level of Function : Independent/Modified Independent ?  ?  ?  ?  ?  ?  ?Mobility Comments: has not used AD since TKA ?ADLs Comments: I for all self care ?  ? ? ?Hand Dominance  ? Dominant Hand: Right ? ?  ?Extremity/Trunk Assessment  ? Upper Extremity Assessment ?Upper Extremity Assessment: Defer to OT evaluation ?  ? ?Lower Extremity Assessment ?Lower Extremity Assessment: Generalized weakness ?  ? ?Cervical / Trunk Assessment ?Cervical / Trunk Assessment: Kyphotic (mild)  ?Communication  ? Communication: No difficulties  ?Cognition Arousal/Alertness: Awake/alert ?Behavior During Therapy: Restless ?Overall Cognitive Status: No family/caregiver present to determine baseline cognitive functioning ?  ?  ?  ?  ?  ?  ?  ?  ?  ?  ?  ?  ?  ?  ?  ?  ?General Comments: has given information to PT that is conflicting at times, first reporting she does not use AD then stating she is on crutches at home ?  ?  ? ?  ?General Comments General comments (skin integrity, edema, etc.): pt is able to maneuver her IV pole to walk, but is hindered by the extra equipment.  Will anticipate a removal of the IV pole and SPC at most will cover her needs, which pt has at home ? ?  ?Exercises    ? ?Assessment/Plan  ?  ?PT Assessment Patient needs continued PT services  ?PT Problem List Decreased strength;Decreased balance;Decreased coordination;Decreased safety awareness ? ?   ?  ?PT Treatment Interventions DME instruction;Gait training;Stair training;Functional mobility training;Therapeutic activities;Balance training;Therapeutic exercise;Neuromuscular re-education;Patient/family education   ? ?PT Goals (Current goals can be found in the Care Plan section)  ?Acute Rehab PT Goals ?Patient Stated Goal: to go home and feel better ?PT Goal Formulation: With patient ?Time For Goal  Achievement: 02/26/22 ?Potential to Achieve Goals: Good ? ?  ?Frequency Min 2X/week ?  ? ? ?Co-evaluation   ?  ?  ?  ?  ? ? ?  ?AM-PAC PT "6 Clicks" Mobility  ?Outcome Measure Help needed turning from your back to your side while in a flat bed without using bedrails?: None ?Help needed moving from lying on your back to sitting on the side of a flat bed without using bedrails?: A Little ?Help needed moving to and from a bed to a chair (including a wheelchair)?: A Little ?Help needed standing up from a chair using your arms (e.g., wheelchair or bedside chair)?: A Little ?Help needed to walk in hospital room?: A Little ?Help needed climbing 3-5 steps with a railing? : A Lot ?6 Click Score: 18 ? ?  ?End of Session Equipment Utilized During Treatment: Gait belt ?Activity Tolerance: Patient limited by fatigue;Treatment limited secondary to medical complications (Comment) ?Patient left: in bed;with call bell/phone within reach;with bed alarm set ?Nurse Communication: Mobility status ?PT Visit Diagnosis: Unsteadiness on feet (R26.81);Difficulty in walking, not elsewhere classified (R26.2) ?  ? ?Time: 9323-5573 ?PT Time Calculation (min) (ACUTE ONLY): 35 min ? ? ?Charges:   PT Evaluation ?$PT Eval Moderate Complexity: 1 Mod ?PT Treatments ?$Gait Training: 8-22 mins ?  ?   ? ?  Ramond Dial ?02/19/2022, 3:47 PM ? ?Mee Hives, PT PhD ?Acute Rehab Dept. Number: [facility] [phone] and [place] [phone] ? ?

## 2022-02-19 NOTE — Hospital Course (Signed)
61 year old female with past medical history of 61 year old female with past medical history of hypertension, obesity, COPD and hyperlipidemia presented to the emergency room on 4/19 with right facial droop that had resolved after arrival to the emergency room.  Initial CT scan of head unremarkable.  Also of concern was patient's hemoglobin which was 9.8 with a normal MCV, but was 13.9 13 days prior.  On arrival to the emergency room, patient was hypotensive with a systolic in the 26Z.  Brought in for further evaluation.  Neurology consulted and MRI/MRA unremarkable. ?

## 2022-02-19 NOTE — Consult Note (Signed)
?  ?Jonathon Bellows , MD ?[address] [place], [place], [place], [address] ?[address] [place], [place], [place], [address] ?Phone: [phone]  ?Fax: [phone] ? Consultation ? ?Referring Provider:    Dr Maryland Pink ?Primary Care Physician:  Steele Sizer, MD ?Primary Gastroenterologist:  Dr. Marius Ditch         ?Reason for Consultation:     Unintentional weight loss, drop in hemoglobin ? ?Date of Admission:  02/18/2022 ?Date of Consultation:  02/19/2022 ?       ? HPI:   ?[name] is a 61 y.o. female came into the hospital with complaint of slurred speech and right facial droop.  History of hypertension, COPD, GERD, peptic ulcer disease.  Patient being evaluated for TIA versus stroke. ? ? ?Had a CT head yesterday that showed no acute hemorrhage or infarct.  MRI brain showed normal MRI MRA.  Ultrasound Doppler no significant plaque.  Abdominal x-ray shows no acute findings. ? ?02/18/2022: INR 1.0, ?Hemoglobin 9.8 down from a baseline of around 13.9 g 2 weeks back.  CMP shows no gross abnormality drug screen in process, B12 low normal, folate normal, iron studies normal, HbA1c 6.4.  Hemoglobin 10.3 g today. ? ?12/01/2021 colonoscopy by Dr. Marius Ditch was normal.  In 2019 an upper endoscopy was performed by Dr. Bonna Gains no significant abnormalities were noted. ?In June 2019 a colonoscopy performed also by Dr. Bonna Gains showed LA grade a esophagitis gastric ulcer.  He had been on chronic NSAIDs at that point of time. ? ? ? ?I been consulted to evaluate further drop in hemoglobin and for severe nausea and inability to eat with weight loss. ? ?Says presently has no vomiting and had some nausea only yesteday , denies any hematemsis , nasal bleeds or vaginal bleeds, recalls last colonoscopy was in 12/2021 and was normal , told to repeat in 10 years, no priro EGD. H/o BC power use but not used any for few weeks. No complaints presently .  ?Past Medical History:  ?Diagnosis Date  ? Abdominal aortic atherosclerosis ([place])   ?  Anemia   ? Arthritis   ? bilateral knees  ? Bronchitis   ? Carpal tunnel syndrome, bilateral   ? Contact dermatitis and other eczema, due to unspecified cause   ? COPD (chronic obstructive pulmonary disease) ([place])   ? Depressive disorder   ? Diverticulosis   ? Dysmetabolic syndrome X   ? Eczema   ? GERD (gastroesophageal reflux disease)   ? Hyperlipidemia   ? Hypertension   ? IBS (irritable bowel syndrome)   ? Insomnia   ? Lumbago   ? Nonspecific abnormal electrocardiogram (ECG) (EKG)   ? Osteoarthritis of both knees   ? Other ovarian failure(256.39)   ? Peptic ulcer   ? Postmenopausal atrophic vaginitis   ? Symptomatic menopausal or female climacteric states   ? Unspecified vitamin D deficiency   ? ? ?Past Surgical History:  ?Procedure Laterality Date  ? ACHILLES TENDON SURGERY Left 01/02/2022  ? Procedure: ACHILLES TENDON REPAIR-SECONDARY;  Surgeon: Samara Deist, DPM;  Location: [facility] ORS;  Service: Podiatry;  Laterality: Left;  ? BACK SURGERY    ? BREAST EXCISIONAL BIOPSY Left ?  ? neg  ? BREAST LUMPECTOMY Left 01/08/2021  ? Procedure: BREAST LUMPECTOMY;  Surgeon: Ronny Bacon, MD;  Location: [facility] ORS;  Service: General;  Laterality: Left;  ? CARPAL TUNNEL RELEASE Left 08/15/2018  ? Procedure: CARPAL TUNNEL RELEASE;  Surgeon: Earnestine Leys, MD;  Location: [facility] ORS;  Service: Orthopedics;  Laterality: Left;  ? CERVICAL DISCECTOMY    ? x 2; metal plate  ? CHOLECYSTECTOMY    ? COLONOSCOPY WITH PROPOFOL N/A 08/08/2018  ? Procedure: COLONOSCOPY WITH PROPOFOL;  Surgeon: Virgel Manifold, MD;  Location: [facility] ENDOSCOPY;  Service: Endoscopy;  Laterality: N/A;  ? COLONOSCOPY WITH PROPOFOL N/A 12/01/2021  ? Procedure: COLONOSCOPY WITH PROPOFOL;  Surgeon: Lin Landsman, MD;  Location: [facility] END[facility]COPY;  Service: Gastroenterology;  Laterality: N/A;  ? DILATATION & CURETTAGE/HYSTEROSCOPY WITH MYOSURE N/A 03/21/2015  ? Procedure: DILATATION & CURETTAGE/HYSTEROSCOPY;  Surgeon: Aletha Halim, MD;  Location: [facility] ORS;   Service: Gynecology;  Laterality: N/A;  ? DILATION AND CURETTAGE OF UTERUS    ? ENDOMETRIAL ABLATION    ? ESOPHAGOGASTRODUODENOSCOPY (EGD) WITH PROPOFOL N/A 04/13/2018  ? Procedure: ESOPHAGOGASTRODUODENOSCOPY (EGD) WITH PROPOFOL;  Surgeon: Virgel Manifold, MD;  Location: [facility] ENDOSCOPY;  Service: Endoscopy;  Laterality: N/A;  ? ESOPHAGOGASTRODUODENOSCOPY (EGD) WITH PROPOFOL N/A 08/08/2018  ? Procedure: ESOPHAGOGASTRODUODENOSCOPY (EGD) WITH PROPOFOL;  Surgeon: Virgel Manifold, MD;  Location: [facility] ENDOSCOPY;  Service: Endoscopy;  Laterality: N/A;  ? JOINT REPLACEMENT    ? OSTECTOMY Left 01/02/2022  ? Procedure: CALCANEAL EXOSTECTOMY;  Surgeon: Samara Deist, DPM;  Location: [facility] OR[facility];  Service: Podiatry;  Laterality: Left;  ? SPINAL FUSION    ? lumbar x2  ? TONSILLECTOMY    ? TOTAL KNEE ARTHROPLASTY Left 04/12/2019  ? Procedure: TOTAL KNEE ARTHROPLASTY;  Surgeon: Lovell Sheehan, MD;  Location: [facility] OR[facility];  Service: Orthopedics;  Laterality: Left;  ? TOTAL KNEE ARTHROPLASTY Right 04/17/2020  ? Procedure: TOTAL KNEE ARTHROPLASTY;  Surgeon: Lovell Sheehan, MD;  Location: [facility];  Service: Orthopedics;  Laterality: Right;  ? TUBAL LIGATION    ? ? ?Prior to Admission medications   ?Medication Sig Start Date End Date Taking? Authorizing Provider  ?amLODipine (NORVASC) 5 MG tablet Take 1 tablet (5 mg total) by mouth daily. 06/04/21  Yes Steele Sizer, MD  ?cholecalciferol (VITAMIN D) 1000 units tablet Take 1,000 Units by mouth daily.   Yes [provider]  ?desvenlafaxine (PRISTIQ) 50 MG 24 hr tablet Take 1 tablet (50 mg total) by mouth daily. 06/04/21  Yes Steele Sizer, MD  ?gabapentin (NEURONTIN) 300 MG capsule Take 300 mg by mouth every 6 (six) hours as needed (Nerve pain).   Yes Shanon Ace, MD  ?losartan-hydrochlorothiazide (HYZAAR) 100-25 MG tablet TAKE ONE TABLET BY MOUTH ONCE DAILY 02/18/22  Yes Sowles, Drue Stager, MD  ?Multiple Vitamins-Minerals (MULTIVITAMIN WITH MINERALS) tablet Take 1  tablet by mouth every other day.   Yes [provider]  ?omeprazole (PRILOSEC) 20 MG capsule Take 1 capsule (20 mg total) by mouth daily. 06/04/21  Yes Steele Sizer, MD  ?Oxycodone HCl 20 MG TABS Take 20 mg by mouth every 4 (four) hours as needed (pain). 07/16/20  Yes [facility]  ?QUEtiapine (SEROQUEL) 25 MG tablet Take 1 tablet (25 mg total) by mouth at bedtime. 02/05/22  Yes Delsa Grana, PA-C  ?tiZANidine (ZANAFLEX) 4 MG tablet Take 4 mg by mouth every 8 (eight) hours. 02/13/22  Yes [provider]  ?valACYclovir (VALTREX) 1000 MG tablet Take 1 tablet (1,000 mg total) by mouth 2 (two) times daily. ?Patient taking differently: Take 1,000 mg by mouth 2 (two) times daily as needed (outbreak). 02/17/22  Yes Sowles, Drue Stager, MD  ?amitriptyline (ELAVIL) 10 MG tablet TAKE ONE TABLET BY MOUTH AT BEDTIME FOR IBS 06/04/21   Steele Sizer, MD  ?famotidine (PEPCID) 20 MG tablet Take 1 tablet (  20 mg total) by mouth 2 (two) times daily as needed for heartburn or indigestion. 02/05/22   Delsa Grana, PA-C  ?meloxicam (MOBIC) 15 MG tablet Take 15 mg by mouth daily. ?Patient not taking: Reported on 02/18/2022 07/09/20   [provider]  ?metFORMIN (GLUCOPHAGE) 500 MG tablet Take 1 tablet (500 mg total) by mouth daily with breakfast. ?Patient taking differently: Take 500 mg by mouth once a week. 06/04/21   Steele Sizer, MD  ?[facility] 4 MG/0.1ML LIQD nasal spray kit Place 1 spray into the nose once. 03/14/19   Shanon Ace, MD  ?oxyCODONE-acetaminophen (PERCOCET) 5-325 MG tablet Take 1-2 tablets by mouth every 6 (six) hours as needed for severe pain. Max 6 tabs per day ?Patient not taking: Reported on 02/18/2022 01/02/22   Samara Deist, DPM  ?scopolamine (TRANSDERM-SCOP) 1 MG/3DAYS Place 1 patch (1.5 mg total) onto the skin every 3 (three) days. As needed for nausea.  Remove patch and apply new one after 3 days if still nauseous ?Patient not taking: Reported on 02/18/2022 02/06/22   Delsa Grana, PA-C  ? ? ?Family History  ?Problem Relation Age of Onset  ? Heart disease Mother   ? Thyroid cancer Mother   ? Congestive Heart Failure Mother   ? Depression Daughter   ? Asthma Daughter   ? Breast canc

## 2022-02-19 NOTE — Progress Notes (Signed)
MRI and MRA of brain have come back normal.  ? ?Carotid ultrasound is complete with report pending.  ? ?TTE pending. ? ?Electronically signed: Dr. Kerney Elbe ? ?

## 2022-02-19 NOTE — Assessment & Plan Note (Addendum)
Echocardiogram noted grade 2 diastolic dysfunction.  She has been getting IV fluids for her blood pressure.  Elevated BNP on 4/20 afternoon elevated at 270.  Have stopped IV fluids.  Unclear if she was volume overloaded prior to coming in although I doubt this given her hypotension.   ?

## 2022-02-19 NOTE — Assessment & Plan Note (Addendum)
Heart rate has been in the 40s to 50s.  Not on any chronotropic agents.  TSH checked 2 weeks ago and was normal.  With fluids and increase in her blood pressure, heart rate now in the 60s to 70s ?

## 2022-02-19 NOTE — Progress Notes (Signed)
Patient alert and oriented, refused bed alarms at this time, patient educated about safety, will continue to monitor. ?

## 2022-02-19 NOTE — Progress Notes (Signed)
Multiple attempts to perform shift assessment. Pt with other clinicians or imaging each time. Observation of pt clear speech, no visible abnormalities, observed standing sitting, per repot ambulated to bathroom with no assist. Pt present fully A&O. Will obtain full assessment with pt availability.  ?

## 2022-02-20 DIAGNOSIS — I959 Hypotension, unspecified: Secondary | ICD-10-CM | POA: Diagnosis not present

## 2022-02-20 DIAGNOSIS — G459 Transient cerebral ischemic attack, unspecified: Secondary | ICD-10-CM | POA: Diagnosis not present

## 2022-02-20 DIAGNOSIS — I5033 Acute on chronic diastolic (congestive) heart failure: Secondary | ICD-10-CM | POA: Diagnosis not present

## 2022-02-20 DIAGNOSIS — E669 Obesity, unspecified: Secondary | ICD-10-CM | POA: Diagnosis not present

## 2022-02-20 LAB — BASIC METABOLIC PANEL
Anion gap: 8 (ref 5–15)
BUN: 9 mg/dL (ref 6–20)
CO2: 30 mmol/L (ref 22–32)
Calcium: 9.2 mg/dL (ref 8.9–10.3)
Chloride: 107 mmol/L (ref 98–111)
Creatinine, Ser: 0.64 mg/dL (ref 0.44–1.00)
GFR, Estimated: >60 mL/min (ref >60)
Glucose, Bld: 94 mg/dL (ref 70–99)
Potassium: 3.7 mmol/L (ref 3.5–5.1)
Sodium: 145 mmol/L (ref 135–145)

## 2022-02-20 LAB — CBC
HCT: 31.4 % — ABNORMAL LOW (ref 36.0–46.0)
Hemoglobin: 10.1 g/dL — ABNORMAL LOW (ref 12.0–15.0)
MCH: 30.2 pg (ref 26.0–34.0)
MCHC: 32.2 g/dL (ref 30.0–36.0)
MCV: 94 fL (ref 80.0–100.0)
Platelets: 321 10*3/uL (ref 150–400)
RBC: 3.34 MIL/uL — ABNORMAL LOW (ref 3.87–5.11)
RDW: 12.8 % (ref 11.5–15.5)
WBC: 7 10*3/uL (ref 4.0–10.5)
nRBC: 0 % (ref 0.0–0.2)

## 2022-02-20 MED ORDER — LOSARTAN POTASSIUM 50 MG PO TABS
100.0000 mg | ORAL_TABLET | Freq: Every day | ORAL | Status: DC
  Administered 2022-02-20: 100 mg via ORAL
  Filled 2022-02-20: qty 2

## 2022-02-20 MED ORDER — ASPIRIN 81 MG PO TBEC: 81.0000 mg | DELAYED_RELEASE_TABLET | Freq: Every day | ORAL | 11 refills | Status: AC

## 2022-02-20 MED ORDER — HYDROCHLOROTHIAZIDE 25 MG PO TABS
25.0000 mg | ORAL_TABLET | Freq: Every day | ORAL | Status: DC
  Administered 2022-02-20: 25 mg via ORAL
  Filled 2022-02-20: qty 1

## 2022-02-20 MED ORDER — VALACYCLOVIR HCL 500 MG PO TABS
1000.0000 mg | ORAL_TABLET | Freq: Two times a day (BID) | ORAL | Status: DC
  Administered 2022-02-20: 1000 mg via ORAL
  Filled 2022-02-20: qty 2

## 2022-02-20 MED ORDER — ATORVASTATIN CALCIUM 40 MG PO TABS: 40.0000 mg | ORAL_TABLET | Freq: Every day | ORAL | 1 refills | Status: DC

## 2022-02-20 MED ORDER — LOSARTAN POTASSIUM-HCTZ 100-25 MG PO TABS: 1.0000 | ORAL_TABLET | Freq: Every day | ORAL | Status: DC

## 2022-02-20 NOTE — Progress Notes (Signed)
TTE completed with no cardioembolic source identified.  ? ?No changes to management recommendations from initial Neurology consult note.  ? ?Will need outpatient Neurology follow up.  ? ?Neurohospitalist service will sign off. Please call if there are additional questions.  ? ?Electronically signed: Dr. Kerney Elbe ? ?

## 2022-02-20 NOTE — Discharge Summary (Signed)
?Physician Discharge Summary ?  ?Patient: [name] MRN: [id number] DOB: 1961-10-02  ?Admit date:     02/18/2022  ?Discharge date: 02/20/22  ?Discharge Physician: Annita Brod  ? ?PCP: Steele Sizer, MD  ? ?Recommendations at discharge:  ? ?New medication: Aspirin 81 mg p.o. daily ?New medication: Lipitor 40 mg p.o. daily ?Patient set up with outpatient PT/OT ?Patient will follow-up with gastroenterology as outpatient ? ?Discharge Diagnoses: ?Principal Problem: ?  TIA (transient ischemic attack) ?Active Problems: ?  Normocytic anemia ?  Acute on chronic diastolic CHF (congestive heart failure) ([place]) ?  COPD, mild ([place]) ?  Peptic ulcer ?  Prediabetes ?  Dyslipidemia ?  Major depression, chronic (HCC) ?  Sinus bradycardia ?  Chronic pain ?  Hypokalemia ?  Hypertension, benign ?  Metabolic syndrome ?  Obesity (BMI 30-39.9) ? ?Resolved Problems: ?  * No resolved hospital problems. * ? ?Hospital Course: ?61 year old female with past medical history of 61 year old female with past medical history of hypertension, obesity, COPD and hyperlipidemia presented to the emergency room on 4/19 with right facial droop that had resolved after arrival to the emergency room.  Initial CT scan of head unremarkable.  Also of concern was patient's hemoglobin which was 9.8 with a normal MCV, but was 13.9 13 days prior.  On arrival to the emergency room, patient was hypotensive with a systolic in the 79G.  Brought in for further evaluation.  Neurology consulted and MRI/MRA unremarkable. ? ?Assessment and Plan: ?* TIA (transient ischemic attack) ?Facial droop has resolved after arrival to emergency room.  MRI/MRA negative.  Carotid Dopplers and echocardiogram unremarkable.  Started on aspirin and Lipitor, although certainly concerning for possibility of bleed, see below.  Neurology following.  PT and OT recommending outpatient physical therapy ? ?Acute on chronic diastolic CHF (congestive heart failure) ([place]) ?Echocardiogram noted  grade 2 diastolic dysfunction.  She has been getting IV fluids for her blood pressure.  Elevated BNP on 4/20 afternoon elevated at 270.  Have stopped IV fluids.  Unclear if she was volume overloaded prior to coming in although I doubt this given her hypotension.   ? ?Normocytic anemia ?Hemoglobin dropped from 13.9 on 02/05/2022 --> 9.8.  Patient denies dark stool or rectal bleeding.  Highly suspicious for GI bleed given hypotension on admission.  Patient just had a colonoscopy done few months ago.  GI consulted in not recommending scope at this time.  Outpatient follow-up.  Hemoglobin improved to 10.3 on its own. ? ?COPD, mild ([place]) ?Stable. ?-prn albuterol ? ?Peptic ulcer ?Already on home PPI.  We will continue. ? ?Prediabetes ?A1c at 6.4.  Already on metformin. ? ?Dyslipidemia ?- Started Lipitor 40 mg daily ? ?Sinus bradycardia ?Heart rate has been in the 40s to 50s.  Not on any chronotropic agents.  TSH checked 2 weeks ago and was normal.  With fluids and increase in her blood pressure, heart rate now in the 60s to 70s ? ?Major depression, chronic (HCC) ?- Continue home medications ? ?Chronic pain ?- Continue home as needed oxycodone ? ?Hypokalemia ?Potassium 3.1 ?-Repleted with potassium ?-Check a magnesium level ? ?Hypertension, benign ?-IV prn hydralazine SBP>220 or dBP>110 ?-Hold home amlodipine and Hyzaar due to hypotension ? ?Metabolic syndrome ?- Patient is taking metformin weekly ? ?Obesity (BMI 30-39.9) ?Meets criteria with BMI greater than 30. ? ? ? ? ?  ? ? ?Consultants: Neurology, gastroenterology ?Procedures performed: Echocardiogram noting grade 2 diastolic dysfunction ?Disposition: Home ?Diet recommendation:  ?Discharge Diet Orders (From  admission, onward)  ? ?  Start     Ordered  ? 02/20/22 0000  Diet - low sodium heart healthy       ? 02/20/22 1321  ? ?  ?  ? ?  ? ?Cardiac and Carb modified diet ?DISCHARGE MEDICATION: ?Allergies as of 02/20/2022   ?No Known Allergies ?  ? ?  ?Medication List  ?   ? ?TAKE these medications   ? ?amitriptyline 10 MG tablet ?Commonly known as: ELAVIL ?TAKE ONE TABLET BY MOUTH AT BEDTIME FOR IBS ?  ?amLODipine 5 MG tablet ?Commonly known as: NORVASC ?Take 1 tablet (5 mg total) by mouth daily. ?  ?aspirin 81 MG EC tablet ?Take 1 tablet (81 mg total) by mouth daily. Swallow whole. ?Start taking on: February 21, 2022 ?  ?atorvastatin 40 MG tablet ?Commonly known as: LIPITOR ?Take 1 tablet (40 mg total) by mouth daily. ?Start taking on: February 21, 2022 ?  ?cholecalciferol 1000 units tablet ?Commonly known as: VITAMIN D ?Take 1,000 Units by mouth daily. ?  ?desvenlafaxine 50 MG 24 hr tablet ?Commonly known as: PRISTIQ ?Take 1 tablet (50 mg total) by mouth daily. ?  ?famotidine 20 MG tablet ?Commonly known as: Pepcid ?Take 1 tablet (20 mg total) by mouth 2 (two) times daily as needed for heartburn or indigestion. ?  ?gabapentin 300 MG capsule ?Commonly known as: NEURONTIN ?Take 300 mg by mouth every 6 (six) hours as needed (Nerve pain). ?  ?losartan-hydrochlorothiazide 100-25 MG tablet ?Commonly known as: HYZAAR ?TAKE ONE TABLET BY MOUTH ONCE DAILY ?  ?metFORMIN 500 MG tablet ?Commonly known as: GLUCOPHAGE ?Take 1 tablet (500 mg total) by mouth daily with breakfast. ?What changed: when to take this ?  ?multivitamin with minerals tablet ?Take 1 tablet by mouth every other day. ?  ?Narcan 4 MG/0.1ML Liqd nasal spray kit ?Generic drug: naloxone ?Place 1 spray into the nose once. ?  ?omeprazole 20 MG capsule ?Commonly known as: PRILOSEC ?Take 1 capsule (20 mg total) by mouth daily. ?  ?Oxycodone HCl 20 MG Tabs ?Take 20 mg by mouth every 4 (four) hours as needed (pain). ?  ?QUEtiapine 25 MG tablet ?Commonly known as: SEROQUEL ?Take 1 tablet (25 mg total) by mouth at bedtime. ?  ?tiZANidine 4 MG tablet ?Commonly known as: ZANAFLEX ?Take 4 mg by mouth every 8 (eight) hours. ?  ?valACYclovir 1000 MG tablet ?Commonly known as: VALTREX ?Take 1 tablet (1,000 mg total) by mouth 2 (two) times  daily. ?What changed:  ?when to take this ?reasons to take this ?  ? ?  ? ? Follow-up Information   ? ? Jonathon Bellows, MD. Schedule an appointment as soon as possible for a visit in 2 week(s).   ?Specialty: Gastroenterology ?Contact information: ?[address] ?[place] [address] ?[phone] ? ? ?  ?  ? ?  ?  ? ?  ? ?Discharge Exam: ?Filed Weights  ? 02/18/22 1408  ?Weight: 97.9 kg  ? ?General: Alert and oriented x3, no acute distress ?Cardiovascular: Regular rate and rhythm, S1-S2 ?Lungs: Clear to auscultation bilaterally ? ?Condition at discharge: good ? ?The results of significant diagnostics from this hospitalization (including imaging, microbiology, ancillary and laboratory) are listed below for reference.  ? ?Imaging Studies: ?MR ANGIO HEAD WO CONTRAST ? ?Result Date: 02/18/2022 ?CLINICAL DATA:  Facial droop and slurred speech EXAM: MRI HEAD WITHOUT CONTRAST MRA HEAD WITHOUT CONTRAST TECHNIQUE: Multiplanar, multi-echo pulse sequences of the brain and surrounding structures were acquired without  intravenous contrast. Angiographic images of the Circle of Willis were acquired using MRA technique without intravenous contrast. COMPARISON:  No pertinent prior exam. FINDINGS: MRI HEAD FINDINGS Brain: No acute infarct, mass effect or extra-axial collection. No acute or chronic hemorrhage. Normal white matter signal, parenchymal volume and CSF spaces. The midline structures are normal. Vascular: Major flow voids are preserved. Skull and upper cervical spine: Normal calvarium and skull base. Visualized upper cervical spine and soft tissues are normal. Sinuses/Orbits:No paranasal sinus fluid levels or advanced mucosal thickening. No mastoid or middle ear effusion. Normal orbits. MRA HEAD FINDINGS POSTERIOR CIRCULATION: --Vertebral arteries: Normal --Inferior cerebellar arteries: Normal. --Basilar artery: Normal. --Superior cerebellar arteries: Normal. --Posterior cerebral arteries: Normal. ANTERIOR  CIRCULATION: --Intracranial internal carotid arteries: Normal. --Anterior cerebral arteries (ACA): Normal. --Middle cerebral arteries (MCA): Normal. 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 B

## 2022-02-20 NOTE — Progress Notes (Signed)
Occupational Therapy Treatment Patient Details Name: [name] MRN: [id number] DOB: 28-Feb-1961 Today's Date: 02/20/2022   History of present illness 61 y.o. female admitted 4/19 was found to have slurred speech and R facial droop.  Cleared with MRI for stroke, has lower hgb, had good echo results, and no significant findings from carotid US.  PMHx:   hypertension, hyperlipidemia, COPD, GERD, depression, PUD, lumbago, IBS, anemia, metabolic syndrome, chronic pain,   OT comments  Pt seen for OT tx this date. Pt received in bed, agreeable, but endorsing 7/10 headache behind her eyes. RN notified at end of session. Pt instructed in energy conservation strategies and home/routines modifications including activity pacing, work simplification, and AE/DME to maximize safety/indep while minimizing over exertion for ADL and IADL tasks. Pt verbalized understanding and appreciation for instruction, reporting she has family coming in town next weekend and eager to spend time with them but not overdo it. Pt progressing, continues to benefit from skilled OT.   Recommendations for follow up therapy are one component of a multi-disciplinary discharge planning process, led by the attending physician.  Recommendations may be updated based on patient status, additional functional criteria and insurance authorization.    Follow Up Recommendations  Home health OT    Assistance Recommended at Discharge Intermittent Supervision/Assistance  Patient can return home with the following  A little help with walking and/or transfers;Assistance with cooking/housework;Assist for transportation;Direct supervision/assist for medications management;Help with stairs or ramp for entrance;A little help with bathing/dressing/bathroom   Equipment Recommendations  None recommended by OT    Recommendations for Other Services      Precautions / Restrictions Precautions Precautions: Fall Precaution Comments: monitor for  LOB Restrictions Weight Bearing Restrictions: No       Mobility Bed Mobility               General bed mobility comments: pt declined 2/2 headache and pt reporting having been up recently    Transfers                         Balance                                           ADL either performed or assessed with clinical judgement   ADL                                              Extremity/Trunk Assessment              Vision       Perception     Praxis      Cognition Arousal/Alertness: Awake/alert Behavior During Therapy: WFL for tasks assessed/performed Overall Cognitive Status: Within Functional Limits for tasks assessed                                          Exercises Other Exercises Other Exercises: Pt instructed in energy conservation strategies and home/routines modifications to maximize safety/indep while minimizing over exertion for ADL and IADL tasks.    Shoulder Instructions       General Comments      Pertinent Vitals/ Pain  Pain Assessment Pain Assessment: 0-10 Pain Score: 7  Pain Location: headache Pain Descriptors / Indicators: Headache Pain Intervention(s): Limited activity within patient's tolerance, Monitored during session, Repositioned  Home Living                                          Prior Functioning/Environment              Frequency  Min 2X/week        Progress Toward Goals  OT Goals(current goals can now be found in the care plan section)  Progress towards OT goals: Progressing toward goals  Acute Rehab OT Goals Patient Stated Goal: go home and feel better OT Goal Formulation: With patient Time For Goal Achievement: 03/05/22 Potential to Achieve Goals: Good  Plan Discharge plan remains appropriate;Frequency remains appropriate    Co-evaluation                 AM-PAC OT "6 Clicks" Daily Activity      Outcome Measure   Help from another person eating meals?: None Help from another person taking care of personal grooming?: None Help from another person toileting, which includes using toliet, bedpan, or urinal?: A Little Help from another person bathing (including washing, rinsing, drying)?: A Little Help from another person to put on and taking off regular upper body clothing?: None Help from another person to put on and taking off regular lower body clothing?: A Little 6 Click Score: 21    End of Session    OT Visit Diagnosis: Other abnormalities of gait and mobility (R26.89);Muscle weakness (generalized) (M62.81);Hemiplegia and hemiparesis Hemiplegia - Right/Left: Left Hemiplegia - dominant/non-dominant: Non-Dominant Hemiplegia - caused by: Unspecified   Activity Tolerance Patient tolerated treatment well   Patient Left in bed;with call bell/phone within reach;Other (comment) (MD in room)   Nurse Communication          Time: 5993-5701 OT Time Calculation (min): 11 min  Charges: OT General Charges $OT Visit: 1 Visit OT Treatments $Self Care/Home Management : 8-22 mins  Ardeth Perfect., MPH, MS, OTR/L ascom [phone] 02/20/22, 12:55 PM

## 2022-02-20 NOTE — Progress Notes (Signed)
OT Cancellation Note ? ?Patient Details ?Name: [name] ?MRN: [id number] ?DOB: [birth year] ? ? ?Cancelled Treatment:    Reason Eval/Treat Not Completed: Other (comment). Pt working with PTA upon attempt. Will re-attempt OT tx at later date/time as pt is available.  ? ?Ardeth Perfect., MPH, MS, OTR/L ?ascom [phone] ?02/20/22, 10:04 AM ?

## 2022-02-20 NOTE — Progress Notes (Addendum)
Physical Therapy Treatment ?Patient Details ?Name: [name] ?MRN: [id number] ?DOB: Sep 04, 1961 ?Today's Date: 02/20/2022 ? ? ?History of Present Illness 61 y.o. female admitted 4/19 was found to have slurred speech and R facial droop.  Cleared with MRI for stroke, has lower hgb, had good echo results, and no significant findings from carotid US.  PMHx:   hypertension, hyperlipidemia, COPD, GERD, depression, PUD, lumbago, IBS, anemia, metabolic syndrome, chronic pain, ? ?  ?PT Comments  ? ? Assist to don foot brace.  She is able to get up and walk 100' on unit with crutches that were brought from home with supervision.  Generally steady with good use.  She does endorse some dizziness but does not impact gait.  Stated she is a bit slower than baseline but progressing well.  She prefers crutches over RW.  She declined stair training as she felt she would not have difficulty with them. ?  ?Recommendations for follow up therapy are one component of a multi-disciplinary discharge planning process, led by the attending physician.  Recommendations may be updated based on patient status, additional functional criteria and insurance authorization. ? ?Follow Up Recommendations ? Outpatient PT ?  ?  ?Assistance Recommended at Discharge Intermittent Supervision/Assistance  ?Patient can return home with the following A little help with walking and/or transfers;A little help with bathing/dressing/bathroom;Assistance with cooking/housework;Help with stairs or ramp for entrance;Assist for transportation ?  ?Equipment Recommendations ? Rolling walker (2 wheels)  ?  ?Recommendations for Other Services   ? ? ?  ?Precautions / Restrictions Precautions ?Precautions: Fall ?Precaution Comments: monitor for LOB ?Restrictions ?Weight Bearing Restrictions: No  ?  ? ?Mobility ? Bed Mobility ?Overal bed mobility: Modified Independent ?  ?  ?  ?  ?  ?  ?  ?  ? ?Transfers ?Overall transfer level: Modified independent ?  ?  ?  ?  ?  ?  ?  ?  ?   ?  ? ?Ambulation/Gait ?Ambulation/Gait assistance: Modified independent (Device/Increase time) ?Gait Distance (Feet): 100 Feet ?Assistive device: Crutches ?Gait Pattern/deviations: Step-through pattern, Decreased stride length ?Gait velocity: reduced ?  ?  ?General Gait Details: generally steady with crutches.  stated she feels at her baseline with mobility but a bit slower ? ? ?Stairs ?  ?  ?  ?  ?  ? ? ?Wheelchair Mobility ?  ? ?Modified Rankin (Stroke Patients Only) ?  ? ? ?  ?Balance Overall balance assessment: Needs assistance ?Sitting-balance support: Feet supported ?Sitting balance-Leahy Scale: Good ?  ?  ?Standing balance support: Single extremity supported, During functional activity, Bilateral upper extremity supported, No upper extremity supported ?Standing balance-Leahy Scale: Fair ?Standing balance comment: better with UE support ?  ?  ?  ?  ?  ?  ?  ?  ?  ?  ?  ?  ? ?  ?Cognition Arousal/Alertness: Awake/alert ?Behavior During Therapy: [facility] for tasks assessed/performed ?Overall Cognitive Status: Within Functional Limits for tasks assessed ?  ?  ?  ?  ?  ?  ?  ?  ?  ?  ?  ?  ?  ?  ?  ?  ?  ?  ?  ? ?  ?Exercises   ? ?  ?General Comments   ?  ?  ? ?Pertinent Vitals/Pain Pain Assessment ?Pain Assessment: No/denies pain  ? ? ?Home Living   ?  ?  ?  ?  ?  ?  ?  ?  ?  ?   ?  ?  Prior Function    ?  ?  ?   ? ?PT Goals (current goals can now be found in the care plan section) Progress towards PT goals: Progressing toward goals ? ?  ?Frequency ? ? ? Min 2X/week ? ? ? ?  ?PT Plan    ? ? ?Co-evaluation   ?  ?  ?  ?  ? ?  ?AM-PAC PT "6 Clicks" Mobility   ?Outcome Measure ? Help needed turning from your back to your side while in a flat bed without using bedrails?: None ?Help needed moving from lying on your back to sitting on the side of a flat bed without using bedrails?: A Little ?Help needed moving to and from a bed to a chair (including a wheelchair)?: A Little ?Help needed standing up from a chair using your  arms (e.g., wheelchair or bedside chair)?: A Little ?Help needed to walk in hospital room?: A Little ?Help needed climbing 3-5 steps with a railing? : A Lot ?6 Click Score: 18 ? ?  ?End of Session Equipment Utilized During Treatment: Gait belt ?Activity Tolerance: Patient limited by fatigue;Treatment limited secondary to medical complications (Comment) ?Patient left: in bed;with call bell/phone within reach;with bed alarm set ?Nurse Communication: Mobility status ?PT Visit Diagnosis: Unsteadiness on feet (R26.81);Difficulty in walking, not elsewhere classified (R26.2) ?  ? ? ?Time: 3825-0539 ?PT Time Calculation (min) (ACUTE ONLY): 14 min ? ?Charges:  $Gait Training: 8-22 mins          ?         Chesley Noon, PTA ?02/20/22, 10:19 AM ? ?

## 2022-03-02 ENCOUNTER — Encounter: Payer: Self-pay | Admitting: Family Medicine

## 2022-03-02 ENCOUNTER — Ambulatory Visit (INDEPENDENT_AMBULATORY_CARE_PROVIDER_SITE_OTHER): Payer: Medicare Other | Admitting: Family Medicine

## 2022-03-02 VITALS — BP 132/74 | HR 95 | Temp 98.5°F | Resp 18 | Ht 69.0 in | Wt 201.5 lb

## 2022-03-02 DIAGNOSIS — Z09 Encounter for follow-up examination after completed treatment for conditions other than malignant neoplasm: Secondary | ICD-10-CM

## 2022-03-02 DIAGNOSIS — K219 Gastro-esophageal reflux disease without esophagitis: Secondary | ICD-10-CM | POA: Diagnosis not present

## 2022-03-02 DIAGNOSIS — R2689 Other abnormalities of gait and mobility: Secondary | ICD-10-CM | POA: Diagnosis not present

## 2022-03-02 DIAGNOSIS — D649 Anemia, unspecified: Secondary | ICD-10-CM

## 2022-03-02 DIAGNOSIS — K259 Gastric ulcer, unspecified as acute or chronic, without hemorrhage or perforation: Secondary | ICD-10-CM

## 2022-03-02 DIAGNOSIS — G459 Transient cerebral ischemic attack, unspecified: Secondary | ICD-10-CM

## 2022-03-02 DIAGNOSIS — M6281 Muscle weakness (generalized): Secondary | ICD-10-CM | POA: Insufficient documentation

## 2022-03-02 DIAGNOSIS — I5033 Acute on chronic diastolic (congestive) heart failure: Secondary | ICD-10-CM

## 2022-03-02 DIAGNOSIS — I69954 Hemiplegia and hemiparesis following unspecified cerebrovascular disease affecting left non-dominant side: Secondary | ICD-10-CM | POA: Diagnosis not present

## 2022-03-02 DIAGNOSIS — I4581 Long QT syndrome: Secondary | ICD-10-CM

## 2022-03-02 LAB — DRUG SCREEN 10 W/CONF, SERUM
Amphetamines, IA: NEGATIVE ng/mL
Barbiturates, IA: NEGATIVE ug/mL
Benzodiazepines, IA: NEGATIVE ng/mL
Cocaine & Metabolite, IA: NEGATIVE ng/mL
Methadone, IA: NEGATIVE ng/mL
Opiates, IA: NEGATIVE ng/mL
Oxycodones, IA: POSITIVE ng/mL — AB
Phencyclidine, IA: NEGATIVE ng/mL
Propoxyphene, IA: NEGATIVE ng/mL
THC(Marijuana) Metabolite, IA: NEGATIVE ng/mL

## 2022-03-02 LAB — OXYCODONES,MS,WB/SP RFX
Oxycocone: 39 ng/mL
Oxycodones Confirmation: POSITIVE
Oxymorphone: NEGATIVE ng/mL

## 2022-03-02 MED ORDER — PANTOPRAZOLE SODIUM 40 MG PO TBEC: 40.0000 mg | DELAYED_RELEASE_TABLET | Freq: Every day | ORAL | 3 refills | Status: DC

## 2022-03-02 MED ORDER — ATORVASTATIN CALCIUM 40 MG PO TABS: 40.0000 mg | ORAL_TABLET | Freq: Every day | ORAL | 1 refills | Status: DC

## 2022-03-02 NOTE — Progress Notes (Signed)
? ?Name: TALON WITTING   MRN: [id number]    DOB: April 25, 1961   Date:03/02/2022 ? ?     Progress Note ? ?Chief Complaint  ?Patient presents with  ? Follow-up  ?  Hospital  ? ? ? ?Subjective:  ? ?UNDREA ARCHBOLD is a 61 y.o. female, presents to clinic for HFU ? ?Here for hospital follow up/transition of care. ? ?Admit date: 02/18/22 ?Discharge date: 02/20/2022 ?No TOC calls done w/in time frame due to d/c on Friday/weekend ? ?Pt was admitted for TIA, noted new anemia w stable H/H prior to discharge though lower than her recent labs on 02/05/22 ?She needs cardiology f/up - needs referral ?New medications started per hospitalization include statin - causing HA taking in AM and daily ASA ?Labs due today - none requested ?Pt feels very tired, still decreased appetite, still HA ?Taking a shower wears her out ?HH was supposed to f/up outpt but hasn't  ?Her daughter came from [place] to be with her but she is leaving tomorrow. ?She has GI f/up scheduled with Dr. Marius Ditch in about 2 weeks - hx of peptic ulcers, she reports feeling like a gobstopper is stuck in her chest (central lower sternum) no food getting stuck, no other alleviating or aggravating factors. ? ?Hospitalization records, labs, tests all reviewed today ? ?She initially presented hypotensive/bradycardic but then throughout hospital stay was very hypertensive ?Reviewed her echo today and carotid duplex scans, MRI brain, MRI head angio, CT - need cardiology referral ? ?New onset anemia - normocytic, no iron deficiency ?Hemoglobin  ?Date Value Ref Range Status  ?02/20/2022 10.1 (L) 12.0 - 15.0 g/dL Final  ?02/19/2022 10.3 (L) 12.0 - 15.0 g/dL Final  ?02/18/2022 9.8 (L) 12.0 - 15.0 g/dL Final  ?02/05/2022 13.9 11.7 - 15.5 g/dL Final  ? ? ? ? ? ? ?Current Outpatient Medications:  ?  amitriptyline (ELAVIL) 10 MG tablet, TAKE ONE TABLET BY MOUTH AT BEDTIME FOR IBS, Disp: 90 tablet, Rfl: 1 ?  amLODipine (NORVASC) 5 MG tablet, Take 1 tablet (5 mg total) by mouth daily., Disp: 90  tablet, Rfl: 1 ?  aspirin EC 81 MG EC tablet, Take 1 tablet (81 mg total) by mouth daily. Swallow whole., Disp: 30 tablet, Rfl: 11 ?  cholecalciferol (VITAMIN D) 1000 units tablet, Take 1,000 Units by mouth daily., Disp: , Rfl:  ?  desvenlafaxine (PRISTIQ) 50 MG 24 hr tablet, Take 1 tablet (50 mg total) by mouth daily., Disp: 90 tablet, Rfl: 1 ?  famotidine (PEPCID) 20 MG tablet, Take 1 tablet (20 mg total) by mouth 2 (two) times daily as needed for heartburn or indigestion., Disp: 60 tablet, Rfl: 0 ?  gabapentin (NEURONTIN) 300 MG capsule, Take 300 mg by mouth every 6 (six) hours as needed (Nerve pain)., Disp: , Rfl:  ?  losartan-hydrochlorothiazide (HYZAAR) 100-25 MG tablet, TAKE ONE TABLET BY MOUTH ONCE DAILY, Disp: 90 tablet, Rfl: 0 ?  metFORMIN (GLUCOPHAGE) 500 MG tablet, Take 1 tablet (500 mg total) by mouth daily with breakfast. (Patient taking differently: Take 500 mg by mouth once a week.), Disp: 90 tablet, Rfl: 1 ?  Multiple Vitamins-Minerals (MULTIVITAMIN WITH MINERALS) tablet, Take 1 tablet by mouth every other day., Disp: , Rfl:  ?  NARCAN 4 MG/0.1ML LIQD nasal spray kit, Place 1 spray into the nose once., Disp: , Rfl:  ?  Oxycodone HCl 20 MG TABS, Take 20 mg by mouth every 4 (four) hours as needed (pain)., Disp: , Rfl:  ?  pantoprazole (PROTONIX)  40 MG tablet, Take 1 tablet (40 mg total) by mouth daily., Disp: 30 tablet, Rfl: 3 ?  QUEtiapine (SEROQUEL) 25 MG tablet, Take 1 tablet (25 mg total) by mouth at bedtime., Disp: 90 tablet, Rfl: 1 ?  tiZANidine (ZANAFLEX) 4 MG tablet, Take 4 mg by mouth every 8 (eight) hours., Disp: , Rfl:  ?  valACYclovir (VALTREX) 1000 MG tablet, Take 1 tablet (1,000 mg total) by mouth 2 (two) times daily. (Patient taking differently: Take 1,000 mg by mouth 2 (two) times daily as needed (outbreak).), Disp: 6 tablet, Rfl: 2 ?  atorvastatin (LIPITOR) 40 MG tablet, Take 1 tablet (40 mg total) by mouth at bedtime., Disp: 90 tablet, Rfl: 1 ? ?Patient Active Problem List  ?  Diagnosis Date Noted  ? Obesity (BMI 30-39.9) 02/19/2022  ? Prediabetes 02/19/2022  ? Sinus bradycardia 02/19/2022  ? Acute on chronic diastolic CHF (congestive heart failure) ([place]) 02/19/2022  ? TIA (transient ischemic attack) 02/18/2022  ? Hypokalemia 02/18/2022  ? Normocytic anemia 02/18/2022  ? Chronic pain 02/18/2022  ? Prolonged QT syndrome 02/05/2022  ? Status post left breast lumpectomy 01/23/2021  ? History of total knee arthroplasty, right 04/17/2020  ? S/P TKR (total knee replacement) using cement, left 04/12/2019  ? Overweight (BMI 25.0-29.9) 04/06/2019  ? Peptic ulcer   ? Colon cancer screening   ? Benign neoplasm of ascending colon   ? Diverticulosis of large intestine without diverticulitis   ? Simple chronic bronchitis ([place]) 08/03/2018  ? Pancreatic lesion 04/21/2018  ? Abdominal aortic atherosclerosis ([place]) 04/21/2018  ? Acute peptic ulcer of stomach   ? Gastric polyp   ? Abdominal pain, epigastric   ? Trigger thumb of right hand 02/25/2018  ? Carpal tunnel syndrome on both sides 10/21/2017  ? CRP elevated 08/09/2017  ? Elevated C-reactive protein 07/28/2017  ? Osteoarthritis of both knees 02/19/2016  ? COPD, mild ([place]) 05/08/2015  ? Vitamin D deficiency 05/07/2015  ? Metabolic syndrome 67/61/9509  ? Eczema 05/07/2015  ? Chronic insomnia 05/07/2015  ? Hypertension, benign 05/07/2015  ? Dyslipidemia 05/07/2015  ? Hyperglycemia 05/07/2015  ? Chronic low back pain 05/07/2015  ? IBS (irritable bowel syndrome) 05/07/2015  ? Gastroesophageal reflux disease without esophagitis 05/07/2015  ? Major depression, chronic ([place]) 05/07/2015  ? Menopausal syndrome (hot flashes) 05/07/2015  ? History of postmenopausal bleeding 01/14/2015  ? ? ?Past Surgical History:  ?Procedure Laterality Date  ? ACHILLES TENDON SURGERY Left 01/02/2022  ? Procedure: ACHILLES TENDON REPAIR-SECONDARY;  Surgeon: Samara Deist, DPM;  Location: [facility] ORS;  Service: Podiatry;  Laterality: Left;  ? BACK SURGERY    ? BREAST EXCISIONAL BIOPSY  Left ?  ? neg  ? BREAST LUMPECTOMY Left 01/08/2021  ? Procedure: BREAST LUMPECTOMY;  Surgeon: Ronny Bacon, MD;  Location: [facility];  Service: General;  Laterality: Left;  ? CARPAL TUNNEL RELEASE Left 08/15/2018  ? Procedure: CARPAL TUNNEL RELEASE;  Surgeon: Earnestine Leys, MD;  Location: [facility];  Service: Orthopedics;  Laterality: Left;  ? CERVICAL DISCECTOMY    ? x 2; metal plate  ? CHOLECYSTECTOMY    ? COLONOSCOPY WITH PROPOFOL N/A 08/08/2018  ? Procedure: COLONOSCOPY WITH PROPOFOL;  Surgeon: Virgel Manifold, MD;  Location: [facility] END[facility];  Service: Endoscopy;  Laterality: N/A;  ? COLONOSCOPY WITH PROPOFOL N/A 12/01/2021  ? Procedure: COLONOSCOPY WITH PROPOFOL;  Surgeon: Lin Landsman, MD;  [facility];  Service: Gastroenterology;  Laterality: N/A;  ? DILATATION & CURETTAGE/HYSTEROSCOPY WITH MYOSURE N/A 03/21/2015  ? Procedure: DILATATION &  CURETTAGE/HYSTEROSCOPY;  Surgeon: Aletha Halim, MD;  Location: [facility];  Service: Gynecology;  Laterality: N/A;  ? DILATION AND CURETTAGE OF UTERUS    ? ENDOMETRIAL ABLATION    ? ESOPHAGOGASTRODUODENOSCOPY (EGD) WITH PROPOFOL N/A 04/13/2018  ? Procedure: ESOPHAGOGASTRODUODENOSCOPY (EGD) WITH PROPOFOL;  Surgeon: Virgel Manifold, MD;  Location: [facility] ENDOSCOPY;  Service: Endoscopy;  Laterality: N/A;  ? ESOPHAGOGASTRODUODENOSCOPY (EGD) WITH PROPOFOL N/A 08/08/2018  ? Procedure: ESOPHAGOGASTRODUODENOSCOPY (EGD) WITH PROPOFOL;  Surgeon: Virgel Manifold, MD;  Location: [facility] ENDOS[facility];  Service: Endoscopy;  Laterality: N/A;  ? JOINT REPLACEMENT    ? OSTECTOMY Left 01/02/2022  ? Procedure: CALCANEAL EXOSTECTOMY;  Surgeon: Samara Deist, DPM;  Location: [facility];  Service: Podiatry;  Laterality: Left;  ? SPINAL FUSION    ? lumbar x2  ? TONSILLECTOMY    ? TOTAL KNEE ARTHROPLASTY Left 04/12/2019  ? Procedure: TOTAL KNEE ARTHROPLASTY;  Surgeon: Lovell Sheehan, MD;  Location: [facility];  Service: Orthopedics;  Laterality: Left;  ? TOTAL KNEE ARTHROPLASTY  Right 04/17/2020  ? Procedure: TOTAL KNEE ARTHROPLASTY;  Surgeon: Lovell Sheehan, MD;  Location: [facility];  Service: Orthopedics;  Laterality: Right;  ? TUBAL LIGATION    ? ? ?Family History  ?Problem Relatio

## 2022-03-03 LAB — COMPLETE METABOLIC PANEL WITH GFR
AG Ratio: 1.6 (calc) (ref 1.0–2.5)
ALT: 16 U/L (ref 6–29)
AST: 19 U/L (ref 10–35)
Albumin: 4.6 g/dL (ref 3.6–5.1)
Alkaline phosphatase (APISO): 112 U/L (ref 37–153)
BUN: 12 mg/dL (ref 7–25)
CO2: 28 mmol/L (ref 20–32)
Calcium: 10.6 mg/dL — ABNORMAL HIGH (ref 8.6–10.4)
Chloride: 104 mmol/L (ref 98–110)
Creat: 0.63 mg/dL (ref 0.50–1.03)
Globulin: 2.9 g/dL (calc) (ref 1.9–3.7)
Glucose, Bld: 86 mg/dL (ref 65–99)
Potassium: 4.8 mmol/L (ref 3.5–5.3)
Sodium: 144 mmol/L (ref 135–146)
Total Bilirubin: 0.5 mg/dL (ref 0.2–1.2)
Total Protein: 7.5 g/dL (ref 6.1–8.1)
eGFR: 102 mL/min/{1.73_m2} (ref >=60)

## 2022-03-03 LAB — CBC WITH DIFFERENTIAL/PLATELET
Absolute Monocytes: 690 cells/uL (ref 200–950)
Basophils Absolute: 46 cells/uL (ref 0–200)
Basophils Relative: 0.5 %
Eosinophils Absolute: 156 cells/uL (ref 15–500)
Eosinophils Relative: 1.7 %
HCT: 38.7 % (ref 35.0–45.0)
Hemoglobin: 12.7 g/dL (ref 11.7–15.5)
Lymphs Abs: 3211 cells/uL (ref 850–3900)
MCH: 30.3 pg (ref 27.0–33.0)
MCHC: 32.8 g/dL (ref 32.0–36.0)
MCV: 92.4 fL (ref 80.0–100.0)
MPV: 11.5 fL (ref 7.5–12.5)
Monocytes Relative: 7.5 %
Neutro Abs: 5097 cells/uL (ref 1500–7800)
Neutrophils Relative %: 55.4 %
Platelets: 348 10*3/uL (ref 140–400)
RBC: 4.19 10*6/uL (ref 3.80–5.10)
RDW: 12.3 % (ref 11.0–15.0)
Total Lymphocyte: 34.9 %
WBC: 9.2 10*3/uL (ref 3.8–10.8)

## 2022-03-03 NOTE — Telephone Encounter (Signed)
Patient returned call and notified of provider response/lab results.  ?

## 2022-03-04 ENCOUNTER — Other Ambulatory Visit: Payer: Self-pay | Admitting: Family Medicine

## 2022-03-04 DIAGNOSIS — M545 Low back pain, unspecified: Secondary | ICD-10-CM | POA: Diagnosis not present

## 2022-03-04 DIAGNOSIS — K219 Gastro-esophageal reflux disease without esophagitis: Secondary | ICD-10-CM | POA: Diagnosis not present

## 2022-03-04 DIAGNOSIS — K5730 Diverticulosis of large intestine without perforation or abscess without bleeding: Secondary | ICD-10-CM | POA: Diagnosis not present

## 2022-03-04 DIAGNOSIS — E785 Hyperlipidemia, unspecified: Secondary | ICD-10-CM | POA: Diagnosis not present

## 2022-03-04 DIAGNOSIS — D649 Anemia, unspecified: Secondary | ICD-10-CM | POA: Diagnosis not present

## 2022-03-04 DIAGNOSIS — E669 Obesity, unspecified: Secondary | ICD-10-CM | POA: Diagnosis not present

## 2022-03-04 DIAGNOSIS — Z8711 Personal history of peptic ulcer disease: Secondary | ICD-10-CM | POA: Diagnosis not present

## 2022-03-04 DIAGNOSIS — Z7984 Long term (current) use of oral hypoglycemic drugs: Secondary | ICD-10-CM | POA: Diagnosis not present

## 2022-03-04 DIAGNOSIS — R7303 Prediabetes: Secondary | ICD-10-CM | POA: Diagnosis not present

## 2022-03-04 DIAGNOSIS — I4581 Long QT syndrome: Secondary | ICD-10-CM | POA: Diagnosis not present

## 2022-03-04 DIAGNOSIS — Z7982 Long term (current) use of aspirin: Secondary | ICD-10-CM | POA: Diagnosis not present

## 2022-03-04 DIAGNOSIS — F32A Depression, unspecified: Secondary | ICD-10-CM | POA: Diagnosis not present

## 2022-03-04 DIAGNOSIS — G459 Transient cerebral ischemic attack, unspecified: Secondary | ICD-10-CM | POA: Diagnosis not present

## 2022-03-04 DIAGNOSIS — E876 Hypokalemia: Secondary | ICD-10-CM | POA: Diagnosis not present

## 2022-03-04 DIAGNOSIS — E559 Vitamin D deficiency, unspecified: Secondary | ICD-10-CM | POA: Diagnosis not present

## 2022-03-04 DIAGNOSIS — E8881 Metabolic syndrome: Secondary | ICD-10-CM | POA: Diagnosis not present

## 2022-03-04 DIAGNOSIS — I69354 Hemiplegia and hemiparesis following cerebral infarction affecting left non-dominant side: Secondary | ICD-10-CM | POA: Diagnosis not present

## 2022-03-04 DIAGNOSIS — K582 Mixed irritable bowel syndrome: Secondary | ICD-10-CM

## 2022-03-04 DIAGNOSIS — G8929 Other chronic pain: Secondary | ICD-10-CM | POA: Diagnosis not present

## 2022-03-04 DIAGNOSIS — I110 Hypertensive heart disease with heart failure: Secondary | ICD-10-CM | POA: Diagnosis not present

## 2022-03-04 DIAGNOSIS — Z4789 Encounter for other orthopedic aftercare: Secondary | ICD-10-CM | POA: Diagnosis not present

## 2022-03-04 DIAGNOSIS — I5033 Acute on chronic diastolic (congestive) heart failure: Secondary | ICD-10-CM | POA: Diagnosis not present

## 2022-03-04 DIAGNOSIS — J449 Chronic obstructive pulmonary disease, unspecified: Secondary | ICD-10-CM | POA: Diagnosis not present

## 2022-03-04 DIAGNOSIS — I700 Atherosclerosis of aorta: Secondary | ICD-10-CM | POA: Diagnosis not present

## 2022-03-04 DIAGNOSIS — Z6830 Body mass index (BMI) 30.0-30.9, adult: Secondary | ICD-10-CM | POA: Diagnosis not present

## 2022-03-04 DIAGNOSIS — Z87891 Personal history of nicotine dependence: Secondary | ICD-10-CM | POA: Diagnosis not present

## 2022-03-05 ENCOUNTER — Other Ambulatory Visit: Payer: Self-pay | Admitting: Family Medicine

## 2022-03-05 ENCOUNTER — Telehealth: Payer: Self-pay | Admitting: *Deleted

## 2022-03-05 ENCOUNTER — Telehealth: Payer: Self-pay | Admitting: Family Medicine

## 2022-03-05 DIAGNOSIS — R110 Nausea: Secondary | ICD-10-CM

## 2022-03-05 NOTE — Chronic Care Management (AMB) (Signed)
?  Care Management  ? ?Note ? ?03/05/2022 ?Name: [name] MRN: [id number] DOB: 03-08-1961 ? ?[name] is a 61 y.o. year old female who is a primary care patient of Steele Sizer, MD. I reached out to [name] by phone today offer care coordination services.  ? ?[name] was given information about care management services today including:  ?Care management services include personalized support from designated clinical staff supervised by her physician, including individualized plan of care and coordination with other care providers ?24/7 contact phone numbers for assistance for urgent and routine care needs. ?The patient may stop care management services at any time by phone call to the office staff. ? ?Patient agreed to services and verbal consent obtained.  ? ?Follow up plan: ?Telephone appointment with care management team member scheduled for: 03/11/2022 ? ?[name], CCMA ?Care Guide, Embedded Care Coordination ?[facility]  ?Direct Dial: [phone] ?  ?

## 2022-03-05 NOTE — Telephone Encounter (Signed)
Verbal orders given  

## 2022-03-05 NOTE — Telephone Encounter (Signed)
Cindy with [facility] is calling in to request PT verbal orders to work with pt  ? ?Frequency: ? ?1 week 1 ?2 week 4  ?1 week 4 ? [phone] - secure voicemail  ?

## 2022-03-09 DIAGNOSIS — D649 Anemia, unspecified: Secondary | ICD-10-CM | POA: Diagnosis not present

## 2022-03-09 DIAGNOSIS — I5033 Acute on chronic diastolic (congestive) heart failure: Secondary | ICD-10-CM | POA: Diagnosis not present

## 2022-03-09 DIAGNOSIS — I110 Hypertensive heart disease with heart failure: Secondary | ICD-10-CM | POA: Diagnosis not present

## 2022-03-09 DIAGNOSIS — I4581 Long QT syndrome: Secondary | ICD-10-CM | POA: Diagnosis not present

## 2022-03-09 DIAGNOSIS — G459 Transient cerebral ischemic attack, unspecified: Secondary | ICD-10-CM | POA: Diagnosis not present

## 2022-03-09 DIAGNOSIS — Z4789 Encounter for other orthopedic aftercare: Secondary | ICD-10-CM | POA: Diagnosis not present

## 2022-03-10 ENCOUNTER — Telehealth: Payer: Self-pay

## 2022-03-10 DIAGNOSIS — D649 Anemia, unspecified: Secondary | ICD-10-CM | POA: Diagnosis not present

## 2022-03-10 DIAGNOSIS — G459 Transient cerebral ischemic attack, unspecified: Secondary | ICD-10-CM | POA: Diagnosis not present

## 2022-03-10 DIAGNOSIS — I4581 Long QT syndrome: Secondary | ICD-10-CM | POA: Diagnosis not present

## 2022-03-10 DIAGNOSIS — I5033 Acute on chronic diastolic (congestive) heart failure: Secondary | ICD-10-CM | POA: Diagnosis not present

## 2022-03-10 DIAGNOSIS — I110 Hypertensive heart disease with heart failure: Secondary | ICD-10-CM | POA: Diagnosis not present

## 2022-03-10 DIAGNOSIS — Z4789 Encounter for other orthopedic aftercare: Secondary | ICD-10-CM | POA: Diagnosis not present

## 2022-03-10 NOTE — Telephone Encounter (Signed)
Copied from [place]. Topic: General - Other >> Mar 10, 2022  4:36 PM Oneta Rack wrote: Requesting verbal orders for 1x 1 and 2x 1 for OT Therapy

## 2022-03-11 ENCOUNTER — Telehealth: Payer: Medicare Other

## 2022-03-11 NOTE — Telephone Encounter (Signed)
Verbal orders given  

## 2022-03-12 DIAGNOSIS — M25512 Pain in left shoulder: Secondary | ICD-10-CM | POA: Diagnosis not present

## 2022-03-12 DIAGNOSIS — M545 Low back pain, unspecified: Secondary | ICD-10-CM | POA: Diagnosis not present

## 2022-03-12 DIAGNOSIS — G894 Chronic pain syndrome: Secondary | ICD-10-CM | POA: Diagnosis not present

## 2022-03-12 DIAGNOSIS — Z79891 Long term (current) use of opiate analgesic: Secondary | ICD-10-CM | POA: Diagnosis not present

## 2022-03-12 DIAGNOSIS — M5432 Sciatica, left side: Secondary | ICD-10-CM | POA: Diagnosis not present

## 2022-03-12 DIAGNOSIS — M5431 Sciatica, right side: Secondary | ICD-10-CM | POA: Diagnosis not present

## 2022-03-12 DIAGNOSIS — M25551 Pain in right hip: Secondary | ICD-10-CM | POA: Diagnosis not present

## 2022-03-12 DIAGNOSIS — M25562 Pain in left knee: Secondary | ICD-10-CM | POA: Diagnosis not present

## 2022-03-12 DIAGNOSIS — M79661 Pain in right lower leg: Secondary | ICD-10-CM | POA: Diagnosis not present

## 2022-03-12 DIAGNOSIS — M542 Cervicalgia: Secondary | ICD-10-CM | POA: Diagnosis not present

## 2022-03-12 DIAGNOSIS — M25561 Pain in right knee: Secondary | ICD-10-CM | POA: Diagnosis not present

## 2022-03-12 DIAGNOSIS — M25511 Pain in right shoulder: Secondary | ICD-10-CM | POA: Diagnosis not present

## 2022-03-12 DIAGNOSIS — M25552 Pain in left hip: Secondary | ICD-10-CM | POA: Diagnosis not present

## 2022-03-12 DIAGNOSIS — M79662 Pain in left lower leg: Secondary | ICD-10-CM | POA: Diagnosis not present

## 2022-03-13 ENCOUNTER — Telehealth: Payer: Self-pay | Admitting: Family Medicine

## 2022-03-13 NOTE — Telephone Encounter (Signed)
Copied from [place] [phone]. Topic: General - Other ?>> Mar 13, 2022  3:55 PM Leward Quan A wrote: ?Reason for CRM: Tiffany with [facility] called in to speak to Dr Ancil Boozer needing clarification on some Dx's for this patient please call Tiffany at Ph# [phone] opt# 2 ?

## 2022-03-16 ENCOUNTER — Other Ambulatory Visit: Payer: Self-pay

## 2022-03-16 ENCOUNTER — Ambulatory Visit (INDEPENDENT_AMBULATORY_CARE_PROVIDER_SITE_OTHER): Payer: Medicare Other | Admitting: Gastroenterology

## 2022-03-16 ENCOUNTER — Encounter: Payer: Self-pay | Admitting: Gastroenterology

## 2022-03-16 VITALS — BP 111/71 | HR 71 | Temp 98.3°F | Ht 69.0 in | Wt 200.2 lb

## 2022-03-16 DIAGNOSIS — K219 Gastro-esophageal reflux disease without esophagitis: Secondary | ICD-10-CM | POA: Diagnosis not present

## 2022-03-16 NOTE — Progress Notes (Signed)
?  ?Cephas Darby, MD ?[address]  ?[address]  ?[place], [place] [address]  ?Main: [phone]  ?Fax: [phone] ? ? ? ?Gastroenterology Consultation ? ?Referring Provider:     Steele Sizer, MD ?Primary Care Physician:  Steele Sizer, MD ?Primary Gastroenterologist:  Dr. Cephas Darby ?Reason for Consultation: Sore throat, retrosternal chest pain ?      ? HPI:   ?[name] is a 61 y.o. female referred by Dr. Steele Sizer, MD  for consultation & management of sore throat, on and off for more than a year.  Patient reports that she has been having burning in her throat as well as retrosternal chest pain that is sporadic and no particular relation to food, comes and goes.  She also reports regurgitation and sometimes heartburn.  Her PCP switched from omeprazole to Protonix 40 mg daily and she also takes Pepcid as needed.  She was recently admitted to [facility] secondary to TIA.  She is on aspirin and Lipitor.  Patient had a drop in hemoglobin during that admission with no evidence of active GI bleed which has corrected on its own.  Her hemoglobin was 12.7 on 03/02/2022.  Currently, patient denies any rectal bleeding, abdominal pain, nausea or vomiting, hematemesis, coffee-ground emesis, hematochezia ? ?Patient had an EGD in 2019 which was unremarkable.  She also has history of grade 2 diastolic dysfunction ? ?NSAIDs: None ? ?Antiplts/Anticoagulants/Anti thrombotics: None ? ?GI Procedures:  ?Colonoscopy 12/01/2021 ?- The entire examined colon is normal. ?- The distal rectum and anal verge are normal on retroflexion view. ?- No specimens collected. ? ?Past Medical History:  ?Diagnosis Date  ? Abdominal aortic atherosclerosis ([place])   ? Anemia   ? Arthritis   ? bilateral knees  ? Bronchitis   ? Carpal tunnel syndrome, bilateral   ? Contact dermatitis and other eczema, due to unspecified cause   ? COPD (chronic obstructive pulmonary disease) ([place])   ? Depressive disorder   ? Diverticulosis   ? Dysmetabolic  syndrome X   ? Eczema   ? GERD (gastroesophageal reflux disease)   ? Hyperlipidemia   ? Hypertension   ? IBS (irritable bowel syndrome)   ? Insomnia   ? Lumbago   ? Nonspecific abnormal electrocardiogram (ECG) (EKG)   ? Osteoarthritis of both knees   ? Other ovarian failure(256.39)   ? Peptic ulcer   ? Postmenopausal atrophic vaginitis   ? Symptomatic menopausal or female climacteric states   ? Unspecified vitamin D deficiency   ? ? ?Past Surgical History:  ?Procedure Laterality Date  ? ACHILLES TENDON SURGERY Left 01/02/2022  ? Procedure: ACHILLES TENDON REPAIR-SECONDARY;  Surgeon: Samara Deist, DPM;  Location: [facility] OR[facility];  Service: Podiatry;  Laterality: Left;  ? BACK SURGERY    ? BREAST EXCISIONAL BIOPSY Left ?  ? neg  ? BREAST LUMPECTOMY Left 01/08/2021  ? Procedure: BREAST LUMPECTOMY;  Surgeon: Ronny Bacon, MD;  Location: [facility] OR[facility];  Service: General;  Laterality: Left;  ? CARPAL TUNNEL RELEASE Left 08/15/2018  ? Procedure: CARPAL TUNNEL RELEASE;  Surgeon: Earnestine Leys, MD;  Location: [facility] OR[facility];  Service: Orthopedics;  Laterality: Left;  ? CERVICAL DISCECTOMY    ? x 2; metal plate  ? CHOLECYSTECTOMY    ? COLONOSCOPY WITH PROPOFOL N/A 08/08/2018  ? Procedure: COLONOSCOPY WITH PROPOFOL;  Surgeon: Virgel Manifold, MD;  Location: [facility] ENDOSCOPY;  Service: Endoscopy;  Laterality: N/A;  ? COLONOSCOPY WITH PROPOFOL N/A 12/01/2021  ? Procedure: COLONOSCOPY WITH PROPOFOL;  Surgeon:  Lin Landsman, MD;  Location: [facility] ENDOS[facility];  Service: Gastroenterology;  Laterality: N/A;  ? DILATATION & CURETTAGE/HYSTEROSCOPY WITH MYOSURE N/A 03/21/2015  ? Procedure: DILATATION & CURETTAGE/HYSTEROSCOPY;  Surgeon: Aletha Halim, MD;  Location: [facility];  Service: Gynecology;  Laterality: N/A;  ? DILATION AND CURETTAGE OF UTERUS    ? ENDOMETRIAL ABLATION    ? ESOPHAGOGASTRODUODENOSCOPY (EGD) WITH PROPOFOL N/A 04/13/2018  ? Procedure: ESOPHAGOGASTRODUODENOSCOPY (EGD) WITH PROPOFOL;  Surgeon: Virgel Manifold, MD;  Location:  [facility] END[facility];  Service: Endoscopy;  Laterality: N/A;  ? ESOPHAGOGASTRODUODENOSCOPY (EGD) WITH PROPOFOL N/A 08/08/2018  ? Procedure: ESOPHAGOGASTRODUODENOSCOPY (EGD) WITH PROPOFOL;  Surgeon: Virgel Manifold, MD;  Location: [facility] END[facility];  Service: Endoscopy;  Laterality: N/A;  ? JOINT REPLACEMENT    ? OSTECTOMY Left 01/02/2022  ? Procedure: CALCANEAL EXOSTECTOMY;  Surgeon: Samara Deist, DPM;  Location: [facility];  Service: Podiatry;  Laterality: Left;  ? SPINAL FUSION    ? lumbar x2  ? TONSILLECTOMY    ? TOTAL KNEE ARTHROPLASTY Left 04/12/2019  ? Procedure: TOTAL KNEE ARTHROPLASTY;  Surgeon: Lovell Sheehan, MD;  Location: [facility];  Service: Orthopedics;  Laterality: Left;  ? TOTAL KNEE ARTHROPLASTY Right 04/17/2020  ? Procedure: TOTAL KNEE ARTHROPLASTY;  Surgeon: Lovell Sheehan, MD;  Location: [facility];  Service: Orthopedics;  Laterality: Right;  ? TUBAL LIGATION    ? ?Current Outpatient Medications:  ?  amitriptyline (ELAVIL) 10 MG tablet, TAKE ONE TABLET BY MOUTH AT BEDTIME FOR IBS, Disp: 30 tablet, Rfl: 0 ?  amLODipine (NORVASC) 5 MG tablet, Take 1 tablet (5 mg total) by mouth daily., Disp: 90 tablet, Rfl: 1 ?  aspirin EC 81 MG EC tablet, Take 1 tablet (81 mg total) by mouth daily. Swallow whole., Disp: 30 tablet, Rfl: 11 ?  atorvastatin (LIPITOR) 40 MG tablet, Take 1 tablet (40 mg total) by mouth at bedtime., Disp: 90 tablet, Rfl: 1 ?  cholecalciferol (VITAMIN D) 1000 units tablet, Take 1,000 Units by mouth daily., Disp: , Rfl:  ?  desvenlafaxine (PRISTIQ) 50 MG 24 hr tablet, Take 1 tablet (50 mg total) by mouth daily., Disp: 90 tablet, Rfl: 1 ?  famotidine (PEPCID) 20 MG tablet, TAKE 1 TABLET(20 MG) BY MOUTH TWICE DAILY AS NEEDED FOR HEARTBURN OR INDIGESTION, Disp: 180 tablet, Rfl: 1 ?  gabapentin (NEURONTIN) 300 MG capsule, Take 300 mg by mouth every 6 (six) hours as needed (Nerve pain)., Disp: , Rfl:  ?  losartan-hydrochlorothiazide (HYZAAR) 100-25 MG tablet, TAKE ONE TABLET BY MOUTH ONCE DAILY, Disp:  90 tablet, Rfl: 0 ?  metFORMIN (GLUCOPHAGE) 500 MG tablet, Take 1 tablet (500 mg total) by mouth daily with breakfast. (Patient taking differently: Take 500 mg by mouth once a week.), Disp: 90 tablet, Rfl: 1 ?  Multiple Vitamins-Minerals (MULTIVITAMIN WITH MINERALS) tablet, Take 1 tablet by mouth every other day., Disp: , Rfl:  ?  NARCAN 4 MG/0.1ML LIQD nasal spray kit, Place 1 spray into the nose once., Disp: , Rfl:  ?  Oxycodone HCl 20 MG TABS, Take 20 mg by mouth every 4 (four) hours as needed (pain)., Disp: , Rfl:  ?  pantoprazole (PROTONIX) 40 MG tablet, Take 1 tablet (40 mg total) by mouth daily., Disp: 30 tablet, Rfl: 3 ?  QUEtiapine (SEROQUEL) 25 MG tablet, Take 1 tablet (25 mg total) by mouth at bedtime., Disp: 90 tablet, Rfl: 1 ?  tiZANidine (ZANAFLEX) 4 MG tablet, Take 4 mg by mouth every 8 (eight) hours., Disp: , Rfl:  ?  valACYclovir (  VALTREX) 1000 MG tablet, Take 1 tablet (1,000 mg total) by mouth 2 (two) times daily. (Patient taking differently: Take 1,000 mg by mouth 2 (two) times daily as needed (outbreak).), Disp: 6 tablet, Rfl: 2 ? ? ? ?Family History  ?Problem Relation Age of Onset  ? Heart disease Mother   ? Thyroid cancer Mother   ? Congestive Heart Failure Mother   ? Depression Daughter   ? Asthma Daughter   ? Breast cancer Maternal Grandmother   ?  ? ?Social History  ? ?Tobacco Use  ? Smoking status: Former  ?  Packs/day: 1.00  ?  Years: 20.00  ?  Pack years: 20.00  ?  Types: Cigarettes  ?  Start date: 07/26/2002  ?  Quit date: 04/06/2019  ?  Years since quitting: 2.9  ? Smokeless tobacco: Never  ? Tobacco comments:  ?  Quit Date 04/06/2019  ?Vaping Use  ? Vaping Use: Never used  ?Substance Use Topics  ? Alcohol use: No  ?  Alcohol/week: 0.0 standard drinks  ? Drug use: No  ? ? ?Allergies as of 03/16/2022  ? (No Known Allergies)  ? ? ?Review of Systems:    ?All systems reviewed and negative except where noted in HPI. ? ? Physical Exam:  ?BP 111/71 (BP Location: Left Arm, Patient Position: Sitting,  Cuff Size: Normal)   Pulse 71   Temp 98.3 ?F (36.8 ?C) (Oral)   Ht _0  (1.753 m)   Wt 200 lb 4 oz (90.8 kg) Comment: with boot  BMI 29.57 kg/m?  ?No LMP recorded. Patient has had an ablation. ? ?Gen

## 2022-03-16 NOTE — Patient Instructions (Signed)

## 2022-03-17 DIAGNOSIS — G5791 Unspecified mononeuropathy of right lower limb: Secondary | ICD-10-CM | POA: Diagnosis not present

## 2022-03-18 NOTE — Telephone Encounter (Signed)
Tiffany from [facility] called back to follow up on the message she left for Dr. Ancil Boozer last week  please call her back at CB# [phone] / option #2 ?

## 2022-03-18 NOTE — Progress Notes (Signed)
Name: [name]   MRN: [id number]    DOB: Aug 25, 1961   Date:03/19/2022       Progress Note  Subjective  Chief Complaint  Follow up/(Last saw Tapia)  HPI     Metabolic syndrome:  She denies polyphagia, polydipsia or polyuria.  Continue life style modification, she is taking Metformin occasionally only . Last hgbA1C was normal.    HTN: bp today is at goal, she has noticed intermittent epigastric pain that is described as something sitting on her chest, going to see cardiologist and will have EGD done also    Depression Major: long history of depression, got worse when mother died in December 12, 2016.  Still medications as prescribed, denies suicidal thoughts or ideation. She has been sick since April and feeling down , but wants to continue current regiment    COPD: She quit smoking 04/2019   She denies any wheezing , she has a dry cough. She has SOB with mild activity now but that is new - likely from CHF.    IBS:she still has intermittent diarrhea and constipation. She states symptoms controlled with medication    GERD: going to see Dr. Marius Ditch for EGD, she has intermittent pain on epigastric area    Atherosclerosis of aorta: she has been taking Atorvastatin now , denies side effects   CHF diastolic and pulmonary hypertension: found on Echo done 01/2022 , going to see Dr. Mylo Red next week. She has orthopnea and uses two pillows going on for months but no  lower extremity edema.   Left achilles tendinitis: had surgery on 05/16, off the boot and wearing regular shoes now, pain has improved  History of TIA: she went to [facility] on 02/18/2021 due to left facial droop and fatigue. Evaluation was negative, found to have anemia   Anemia acute: found during hospital stay in April, levels normalized but she will follow up for EGD next week.   B12 deficiency: discussed SL form to take at least 3 times a week.   Chronic pain: going to the pain clinic and takes medications as prescribed  Fever blisters:  takes Valtrex about once a month   Patient Active Problem List   Diagnosis Date Noted   Pulmonary hypertension ([place]) 12/45/8099   Diastolic heart failure ([place]) 03/19/2022   Other abnormalities of gait and mobility 03/02/2022   Obesity (BMI 30-39.9) 02/19/2022   Prediabetes 02/19/2022   Sinus bradycardia 02/19/2022   TIA (transient ischemic attack) 02/18/2022   Chronic pain 02/18/2022   Prolonged QT syndrome 02/05/2022   Status post left breast lumpectomy 01/23/2021   History of total knee arthroplasty, right 04/17/2020   S/P TKR (total knee replacement) using cement, left 04/12/2019   Benign neoplasm of ascending colon    Diverticulosis of large intestine without diverticulitis    Pancreatic lesion 04/21/2018   Abdominal aortic atherosclerosis ([place]) 04/21/2018   Acute peptic ulcer of stomach    Trigger thumb of right hand 02/25/2018   Carpal tunnel syndrome on both sides 10/21/2017   CRP elevated 08/09/2017   Elevated C-reactive protein 07/28/2017   Osteoarthritis of both knees 02/19/2016   COPD, mild ([place]) 05/08/2015   Vitamin D deficiency 83/38/2505   Metabolic syndrome 39/76/7341   Eczema 05/07/2015   Chronic insomnia 05/07/2015   Hypertension, benign 05/07/2015   Dyslipidemia 05/07/2015   Hyperglycemia 05/07/2015   Chronic low back pain 05/07/2015   Irritable bowel syndrome with both constipation and diarrhea 05/07/2015   Gastroesophageal reflux disease without esophagitis 05/07/2015  Major depression, recurrent, chronic ([place]) 05/07/2015   Menopausal syndrome (hot flashes) 05/07/2015   History of postmenopausal bleeding 01/14/2015    Past Surgical History:  Procedure Laterality Date   ACHILLES TENDON SURGERY Left 01/02/2022   Procedure: ACHILLES TENDON REPAIR-SECONDARY;  Surgeon: Samara Deist, DPM;  Location: [facility] ORS;  Service: Podiatry;  Laterality: Left;   BACK SURGERY     BREAST EXCISIONAL BIOPSY Left ?   neg   BREAST LUMPECTOMY Left 01/08/2021   Procedure:  BREAST LUMPECTOMY;  Surgeon: Ronny Bacon, MD;  Location: [facility] OR[facility];  Service: General;  Laterality: Left;   CARPAL TUNNEL RELEASE Left 08/15/2018   Procedure: CARPAL TUNNEL RELEASE;  Surgeon: Earnestine Leys, MD;  Location: [facility] OR[facility];  Service: Orthopedics;  Laterality: Left;   CERVICAL DISCECTOMY     x 2; metal plate   CHOLECYSTECTOMY     COLONOSCOPY WITH PROPOFOL N/A 08/08/2018   Procedure: COLONOSCOPY WITH PROPOFOL;  Surgeon: Virgel Manifold, MD;  Location: [facility] ENDOSCOPY;  Service: Endoscopy;  Laterality: N/A;   COLONOSCOPY WITH PROPOFOL N/A 12/01/2021   Procedure: COLONOSCOPY WITH PROPOFOL;  Surgeon: Lin Landsman, MD;  Location: [facility] ENDOSCOPY;  Service: Gastroenterology;  Laterality: N/A;   DILATATION & CURETTAGE/HYSTEROSCOPY WITH MYOSURE N/A 03/21/2015   Procedure: DILATATION & CURETTAGE/HYSTEROSCOPY;  Surgeon: Aletha Halim, MD;  Location: [facility];  Service: Gynecology;  Laterality: N/A;   DILATION AND CURETTAGE OF UTERUS     ENDOMETRIAL ABLATION     ESOPHAGOGASTRODUODENOSCOPY (EGD) WITH PROPOFOL N/A 04/13/2018   Procedure: ESOPHAGOGASTRODUODENOSCOPY (EGD) WITH PROPOFOL;  Surgeon: Virgel Manifold, MD;  Location: [facility] END[facility];  Service: Endoscopy;  Laterality: N/A;   ESOPHAGOGASTRODUODENOSCOPY (EGD) WITH PROPOFOL N/A 08/08/2018   Procedure: ESOPHAGOGASTRODUODENOSCOPY (EGD) WITH PROPOFOL;  Surgeon: Virgel Manifold, MD;  Location: [facility] END[facility];  Service: Endoscopy;  Laterality: N/A;   JOINT REPLACEMENT     OSTECTOMY Left 01/02/2022   Procedure: CALCANEAL EXOSTECTOMY;  Surgeon: Samara Deist, DPM;  Location: [facility];  Service: Podiatry;  Laterality: Left;   SPINAL FUSION     lumbar x2   TONSILLECTOMY     TOTAL KNEE ARTHROPLASTY Left 04/12/2019   Procedure: TOTAL KNEE ARTHROPLASTY;  Surgeon: Lovell Sheehan, MD;  Location: [facility];  Service: Orthopedics;  Laterality: Left;   TOTAL KNEE ARTHROPLASTY Right 04/17/2020   Procedure: TOTAL KNEE ARTHROPLASTY;  Surgeon:  Lovell Sheehan, MD;  Location: [facility];  Service: Orthopedics;  Laterality: Right;   TUBAL LIGATION      Family History  Problem Relation Age of Onset   Heart disease Mother    Thyroid cancer Mother    Congestive Heart Failure Mother    Depression Daughter    Asthma Daughter    Breast cancer Maternal Grandmother     Social History   Tobacco Use   Smoking status: Former    Packs/day: 1.00    Years: 20.00    Pack years: 20.00    Types: Cigarettes    Start date: 07/26/2002    Quit date: 04/06/2019    Years since quitting: 2.9   Smokeless tobacco: Never   Tobacco comments:    Quit Date 04/06/2019  Substance Use Topics   Alcohol use: No    Alcohol/week: 0.0 standard drinks     Current Outpatient Medications:    aspirin EC 81 MG EC tablet, Take 1 tablet (81 mg total) by mouth daily. Swallow whole., Disp: 30 tablet, Rfl: 11   atorvastatin (LIPITOR) 40 MG tablet, Take 1 tablet (40 mg total) by  mouth at bedtime., Disp: 90 tablet, Rfl: 1   cholecalciferol (VITAMIN D) 1000 units tablet, Take 1,000 Units by mouth daily., Disp: , Rfl:    Cyanocobalamin (B-12) 1000 MCG SUBL, Place 1 each under the tongue 3 (three) times a week., Disp: , Rfl:    famotidine (PEPCID) 20 MG tablet, TAKE 1 TABLET(20 MG) BY MOUTH TWICE DAILY AS NEEDED FOR HEARTBURN OR INDIGESTION, Disp: 180 tablet, Rfl: 1   gabapentin (NEURONTIN) 300 MG capsule, Take 300 mg by mouth every 6 (six) hours as needed (Nerve pain)., Disp: , Rfl:    metFORMIN (GLUCOPHAGE) 500 MG tablet, Take 1 tablet (500 mg total) by mouth daily with breakfast. (Patient taking differently: Take 500 mg by mouth once a week.), Disp: 90 tablet, Rfl: 1   Multiple Vitamins-Minerals (MULTIVITAMIN WITH MINERALS) tablet, Take 1 tablet by mouth every other day., Disp: , Rfl:    NARCAN 4 MG/0.1ML LIQD nasal spray kit, Place 1 spray into the nose once., Disp: , Rfl:    Oxycodone HCl 20 MG TABS, Take 20 mg by mouth every 4 (four) hours as needed (pain)., Disp: ,  Rfl:    pantoprazole (PROTONIX) 40 MG tablet, Take 1 tablet (40 mg total) by mouth daily., Disp: 30 tablet, Rfl: 3   QUEtiapine (SEROQUEL) 25 MG tablet, Take 1 tablet (25 mg total) by mouth at bedtime., Disp: 90 tablet, Rfl: 1   tiZANidine (ZANAFLEX) 4 MG tablet, Take 4 mg by mouth every 8 (eight) hours., Disp: , Rfl:    valACYclovir (VALTREX) 1000 MG tablet, Take 1 tablet (1,000 mg total) by mouth 2 (two) times daily. (Patient taking differently: Take 1,000 mg by mouth 2 (two) times daily as needed (outbreak).), Disp: 6 tablet, Rfl: 2   amitriptyline (ELAVIL) 10 MG tablet, Take 1 tablet (10 mg total) by mouth at bedtime., Disp: 90 tablet, Rfl: 1   amLODipine (NORVASC) 5 MG tablet, Take 1 tablet (5 mg total) by mouth daily., Disp: 90 tablet, Rfl: 1   desvenlafaxine (PRISTIQ) 50 MG 24 hr tablet, Take 1 tablet (50 mg total) by mouth daily., Disp: 90 tablet, Rfl: 1   losartan-hydrochlorothiazide (HYZAAR) 100-25 MG tablet, Take 1 tablet by mouth daily., Disp: 90 tablet, Rfl: 0  No Known Allergies  I personally reviewed active problem list, medication list, allergies, family history, social history with the patient/caregiver today.   ROS  Constitutional: Negative for fever or weight change.  Respiratory: positive  for dry cough and shortness of breath.   Cardiovascular: Negative for chest pain or palpitations.  Gastrointestinal: Negative for abdominal pain, no bowel changes.  Musculoskeletal: positive for gait problem and  joint swelling.  Skin: Negative for rash.  Neurological: Negative for dizziness or headache.  No other specific complaints in a complete review of systems (except as listed in HPI above).   Objective  Vitals:   03/19/22 1001  BP: 120/74  Pulse: 89  Resp: 18  Temp: 97.9 F (36.6 C)  TempSrc: Oral  SpO2: 94%  Weight: 200 lb 1.6 oz (90.8 kg)  Height: _0  (1.727 m)    Body mass index is 30.43 kg/m.  Physical Exam  Constitutional: Patient appears  well-developed and well-nourished. Obese  No distress.  HEENT: head atraumatic, normocephalic, pupils equal and reactive to light, neck supple, throat within normal limits Cardiovascular: Normal rate, regular rhythm and normal heart sounds.  No murmur heard. Trace ankle  edema. A little more on left /status post left foot surgery  Pulmonary/Chest: Effort normal and  breath sounds normal. No respiratory distress. Abdominal: Soft.  There is no tenderness. Psychiatric: Patient has a normal mood and affect. behavior is normal. Judgment and thought content normal.   Recent Results (from the past 2160 hour(s))  Basic metabolic panel per protocol     Status: Abnormal   Collection Time: 12/26/21  9:01 AM  Result Value Ref Range   Sodium 138 135 - 145 mmol/L   Potassium 3.7 3.5 - 5.1 mmol/L   Chloride 101 98 - 111 mmol/L   CO2 24 22 - 32 mmol/L   Glucose, Bld 102 (H) 70 - 99 mg/dL    Comment: Glucose reference range applies only to samples taken after fasting for at least 8 hours.   BUN 15 6 - 20 mg/dL   Creatinine, Ser 0.62 0.44 - 1.00 mg/dL   Calcium 9.3 8.9 - 10.3 mg/dL   GFR, Estimated >60 >60 mL/min    Comment: (NOTE) Calculated using the CKD-EPI Creatinine Equation (2021)    Anion gap 13 5 - 15    Comment: Performed at [facility], [address] [place], [place] [address]  CBC per protocol     Status: None   Collection Time: 12/26/21  9:01 AM  Result Value Ref Range   WBC 9.0 4.0 - 10.5 K/uL   RBC 4.17 3.87 - 5.11 MIL/uL   Hemoglobin 13.2 12.0 - 15.0 g/dL   HCT 39.1 36.0 - 46.0 %   MCV 93.8 80.0 - 100.0 fL   MCH 31.7 26.0 - 34.0 pg   MCHC 33.8 30.0 - 36.0 g/dL   RDW 13.1 11.5 - 15.5 %   Platelets 268 150 - 400 K/uL   nRBC 0.0 0.0 - 0.2 %    Comment: Performed at [facility], [address] [place], [place] [address]  CBC with Differential/Platelet     Status: Abnormal   Collection Time: 02/05/22  9:42 AM  Result Value Ref Range   WBC 13.7 (H) 3.8 - 10.8  Thousand/uL   RBC 4.52 3.80 - 5.10 Million/uL   Hemoglobin 13.9 11.7 - 15.5 g/dL   HCT 42.5 35.0 - 45.0 %   MCV 94.0 80.0 - 100.0 fL   MCH 30.8 27.0 - 33.0 pg   MCHC 32.7 32.0 - 36.0 g/dL   RDW 11.9 11.0 - 15.0 %   Platelets 414 (H) 140 - 400 Thousand/uL   MPV 11.3 7.5 - 12.5 fL   Neutro Abs 9,837 (H) 1,500 - 7,800 cells/uL   Lymphs Abs 3,028 850 - 3,900 cells/uL   Absolute Monocytes 685 200 - 950 cells/uL   Eosinophils Absolute 96 15 - 500 cells/uL   Basophils Absolute 55 0 - 200 cells/uL   Neutrophils Relative % 71.8 %   Total Lymphocyte 22.1 %   Monocytes Relative 5.0 %   Eosinophils Relative 0.7 %   Basophils Relative 0.4 %  COMPLETE METABOLIC PANEL WITH GFR     Status: Abnormal   Collection Time: 02/05/22  9:42 AM  Result Value Ref Range   Glucose, Bld 112 (H) 65 - 99 mg/dL    Comment: .            Fasting reference interval . For someone without known diabetes, a glucose value between 100 and 125 mg/dL is consistent with prediabetes and should be confirmed with a follow-up test. .    BUN 12 7 - 25 mg/dL   Creat 0.60 0.50 - 1.03 mg/dL   eGFR 103 > OR =  60 mL/min/1.74m    Comment: The eGFR is based on the CKD-EPI 2021 equation. To calculate  the new eGFR from a previous Creatinine or Cystatin C result, go to https://www.kidney.org/professionals/ kdoqi/gfr%5Fcalculator    BUN/Creatinine Ratio NOT APPLICABLE 6 - 22 (calc)   Sodium 140 135 - 146 mmol/L   Potassium 3.5 3.5 - 5.3 mmol/L   Chloride 98 98 - 110 mmol/L   CO2 29 20 - 32 mmol/L   Calcium 10.6 (H) 8.6 - 10.4 mg/dL   Total Protein 7.9 6.1 - 8.1 g/dL   Albumin 5.0 3.6 - 5.1 g/dL   Globulin 2.9 1.9 - 3.7 g/dL (calc)   AG Ratio 1.7 1.0 - 2.5 (calc)   Total Bilirubin 0.6 0.2 - 1.2 mg/dL   Alkaline phosphatase (APISO) 106 37 - 153 U/L   AST 24 10 - 35 U/L   ALT 19 6 - 29 U/L  Urine Culture     Status: None   Collection Time: 02/05/22  9:42 AM   Specimen: Urine  Result Value Ref Range   MICRO NUMBER:  [id number]   SPECIMEN QUALITY: Adequate    Sample Source URINE    STATUS: FINAL    Result: No Growth   TSH     Status: None   Collection Time: 02/05/22  9:42 AM  Result Value Ref Range   TSH 0.90 0.40 - 4.50 mIU/L  POCT urinalysis dipstick     Status: Abnormal   Collection Time: 02/05/22 10:02 AM  Result Value Ref Range   Color, UA Yellow    Clarity, UA Cloudy    Glucose, UA Negative Negative   Bilirubin, UA Small    Ketones, UA Negative    Spec Grav, UA 1.010 1.010 - 1.025   Blood, UA Trace    pH, UA 8.0 5.0 - 8.0   Protein, UA Positive (A) Negative    Comment: 100+   Urobilinogen, UA 0.2 0.2 or 1.0 E.U./dL   Nitrite, UA Negative    Leukocytes, UA Large (3+) (A) Negative   Appearance Yellow    Odor Foul   Glucose, capillary     Status: Abnormal   Collection Time: 02/18/22  1:43 PM  Result Value Ref Range   Glucose-Capillary 121 (H) 70 - 99 mg/dL    Comment: Glucose reference range applies only to samples taken after fasting for at least 8 hours.  Protime-INR     Status: None   Collection Time: 02/18/22  1:44 PM  Result Value Ref Range   Prothrombin Time 13.5 11.4 - 15.2 seconds   INR 1.0 0.8 - 1.2    Comment: (NOTE) INR goal varies based on device and disease states. [facility] at [facility] [address] [place] [address]  APTT     Status: None   Collection Time: 02/18/22  1:44 PM  Result Value Ref Range   aPTT 30 24 - 36 seconds    Comment: Performed at [facility] [address], [place] [id number] CBC     Status: Abnormal   Collection Time: 02/18/22  1:44 PM  Result Value Ref Range   WBC 7.0 4.0 - 10.5 K/uL   RBC 3.25 (L) 3.87 - 5.11 MIL/uL   Hemoglobin 9.8 (L) 12.0 - 15.0 g/dL   HCT 30.8 (L) 36.0 - 46.0 %   MCV 94.8 80.0 - 100.0 fL   MCH 30.2 26.0 - 34.0 pg   MCHC 31.8 30.0 - 36.0 g/dL  RDW 13.1 11.5 - 15.5 %   Platelets 355 150 - 400 K/uL   nRBC 0.0 0.0 - 0.2 %    Comment: Performed at [facility], [address] [place], [place] [address]  Differential     Status: None   Collection Time: 02/18/22  1:44 PM  Result Value Ref Range   Neutrophils Relative % 49 %   Neutro Abs 3.4 1.7 - 7.7 K/uL   Lymphocytes Relative 41 %   Lymphs Abs 2.8 0.7 - 4.0 K/uL   Monocytes Relative 8 %   Monocytes Absolute 0.5 0.1 - 1.0 K/uL   Eosinophils Relative 2 %   Eosinophils Absolute 0.1 0.0 - 0.5 K/uL   Basophils Relative 0 %   Basophils Absolute 0.0 0.0 - 0.1 K/uL   Immature Granulocytes 0 %   Abs Immature Granulocytes 0.02 0.00 - 0.07 K/uL    Comment: Performed at [facility], [address] [place], [place] [address]  Comprehensive metabolic panel     Status: Abnormal   Collection Time: 02/18/22  1:44 PM  Result Value Ref Range   Sodium 140 135 - 145 mmol/L   Potassium 3.1 (L) 3.5 - 5.1 mmol/L   Chloride 105 98 - 111 mmol/L   CO2 26 22 - 32 mmol/L   Glucose, Bld 119 (H) 70 - 99 mg/dL    Comment: Glucose reference range applies only to samples taken after fasting for at least 8 hours.   BUN 6 6 - 20 mg/dL   Creatinine, Ser 0.87 0.44 - 1.00 mg/dL   Calcium 8.6 (L) 8.9 - 10.3 mg/dL   Total Protein 6.3 (L) 6.5 - 8.1 g/dL   Albumin 3.4 (L) 3.5 - 5.0 g/dL   AST 29 15 - 41 U/L   ALT 16 0 - 44 U/L   Alkaline Phosphatase 82 38 - 126 U/L   Total Bilirubin 0.3 0.3 - 1.2 mg/dL   GFR, Estimated >60 >60 mL/min    Comment: (NOTE) Calculated using the CKD-EPI Creatinine Equation (2021)    Anion gap 9 5 - 15    Comment: Performed at [facility], [address] [place], [place] [address]  Ethanol     Status: None   Collection Time: 02/18/22  2:50 PM  Result Value Ref Range   Alcohol, Ethyl (B) <10 <10 mg/dL    Comment: (NOTE) Lowest detectable limit for serum alcohol is 10 mg/dL.  For medical purposes only. Performed at [facility], [address] [place], [place] [address]   Drug Screen 10 W/Conf, Serum     Status: Abnormal   Collection Time:  02/18/22  2:50 PM  Result Value Ref Range   Amphetamines, IA Negative Cutoff:50 ng/mL   Barbiturates, IA Negative Cutoff:0.1 ug/mL   Benzodiazepines, IA Negative Cutoff:20 ng/mL   Cocaine & Metabolite, IA Negative Cutoff:25 ng/mL   Phencyclidine, IA Negative Cutoff:8 ng/mL   THC(Marijuana) Metabolite, IA Negative Cutoff:5 ng/mL   Opiates, IA Negative Cutoff:5 ng/mL   Oxycodones, IA ++POSITIVE++ (A) Cutoff:5 ng/mL   Methadone, IA Negative Cutoff:25 ng/mL   Propoxyphene, IA Negative Cutoff:50 ng/mL    Comment: (NOTE) This test was developed and its performance characteristics determined by [facility].  It has not been cleared or approved by the Food and Drug Administration. Performed At: [facility] [place], [place] [id number] Madelaine Etienne PhrmD [id number]   Oxycodones,MS,WB/Sp Rfx     Status: None   Collection  Time: 02/18/22  2:50 PM  Result Value Ref Range   Oxycodones Confirmation Positive    Oxycocone 39.0 ng/mL   Oxymorphone Negative ng/mL    Comment: (NOTE) Expected metabolism of oxycodone class drugs: Parent Drug       Detected Metabolites -----------       -------------------- Oxycodone:        Oxymorphone Oxymorphone:      None Confirmation threshold: 1.0 ng/mL Performed At: [facility] [place], [place] [id number] Madelaine Etienne PhrmD [id number]   Magnesium     Status: None   Collection Time: 02/18/22  5:25 PM  Result Value Ref Range   Magnesium 1.8 1.7 - 2.4 mg/dL    Comment: Performed at [facility], [address] [place], [place] [address]  Vitamin B12     Status: None   Collection Time: 02/18/22  5:25 PM  Result Value Ref Range   Vitamin B-12 318 180 - 914 pg/mL    Comment: (NOTE) This assay is not validated for testing neonatal or myeloproliferative syndrome specimens for Vitamin B12 levels. Performed at [facility], [place] [address] [place], [place] [address]   Folate      Status: None   Collection Time: 02/18/22  5:25 PM  Result Value Ref Range   Folate 17.8 >5.9 ng/mL    Comment: Performed at [facility], [address] [place], [place] [address]  Iron and TIBC     Status: None   Collection Time: 02/18/22  5:25 PM  Result Value Ref Range   Iron 37 28 - 170 ug/dL   TIBC 266 250 - 450 ug/dL   Saturation Ratios 14 10.4 - 31.8 %   UIBC 229 ug/dL    Comment: Performed at [facility], [address] [place], [place] [address]  Ferritin     Status: None   Collection Time: 02/18/22  5:25 PM  Result Value Ref Range   Ferritin 52 11 - 307 ng/mL    Comment: Performed at [facility], [address] [place], [place] [address]  Reticulocytes     Status: Abnormal   Collection Time: 02/18/22  5:25 PM  Result Value Ref Range   Retic Ct Pct 2.0 0.4 - 3.1 %   RBC. 3.16 (L) 3.87 - 5.11 MIL/uL   Retic Count, Absolute 63.5 19.0 - 186.0 K/uL   Immature Retic Fract 23.6 (H) 2.3 - 15.9 %    Comment: Performed at [facility], [address] [place], [place] [address]  HIV Antibody (routine testing w rflx)     Status: None   Collection Time: 02/18/22  5:25 PM  Result Value Ref Range   HIV Screen 4th Generation wRfx Non Reactive Non Reactive    Comment: Performed at [facility], [place] [address] [place], [place] [address]  Hemoglobin A1c     Status: Abnormal   Collection Time: 02/18/22  5:25 PM  Result Value Ref Range   Hgb A1c MFr Bld 6.4 (H) 4.8 - 5.6 %    Comment: (NOTE) Pre diabetes:          5.7%-6.4%  Diabetes:              >6.4%  Glycemic control for   <7.0% adults with diabetes    Mean Plasma Glucose 136.98 mg/dL    Comment: Performed at [facility] [address] [place], [place] [address]  Lipid  panel     Status: Abnormal   Collection Time: 02/19/22  3:39 AM  Result Value Ref Range   Cholesterol 120 0 - 200 mg/dL   Triglycerides 258 (H) <150 mg/dL   HDL 34 (L) >40 mg/dL   Total CHOL/HDL  Ratio 3.5 RATIO   VLDL 52 (H) 0 - 40 mg/dL   LDL Cholesterol 34 0 - 99 mg/dL    Comment:        Total Cholesterol/HDL:CHD Risk Coronary Heart Disease Risk Table                     Men   Women  1/2 Average Risk   3.4   3.3  Average Risk       5.0   4.4  2 X Average Risk   9.6   7.1  3 X Average Risk  23.4   11.0        Use the calculated Patient Ratio above and the CHD Risk Table to determine the patient's CHD Risk.        ATP III CLASSIFICATION (LDL):  <100     mg/dL   Optimal  100-129  mg/dL   Near or Above                    Optimal  130-159  mg/dL   Borderline  160-189  mg/dL   High  >190     mg/dL   Very High Performed at [facility], [address] [place], [place] [address]   Basic metabolic panel     Status: Abnormal   Collection Time: 02/19/22 11:01 AM  Result Value Ref Range   Sodium 139 135 - 145 mmol/L   Potassium 3.8 3.5 - 5.1 mmol/L   Chloride 105 98 - 111 mmol/L   CO2 29 22 - 32 mmol/L   Glucose, Bld 107 (H) 70 - 99 mg/dL    Comment: Glucose reference range applies only to samples taken after fasting for at least 8 hours.   BUN 6 6 - 20 mg/dL   Creatinine, Ser 0.59 0.44 - 1.00 mg/dL   Calcium 8.5 (L) 8.9 - 10.3 mg/dL   GFR, Estimated >60 >60 mL/min    Comment: (NOTE) Calculated using the CKD-EPI Creatinine Equation (2021)    Anion gap 5 5 - 15    Comment: Performed at [facility], [address] [place], [place] [address]  CBC     Status: Abnormal   Collection Time: 02/19/22 11:01 AM  Result Value Ref Range   WBC 6.2 4.0 - 10.5 K/uL   RBC 3.39 (L) 3.87 - 5.11 MIL/uL   Hemoglobin 10.3 (L) 12.0 - 15.0 g/dL   HCT 32.7 (L) 36.0 - 46.0 %   MCV 96.5 80.0 - 100.0 fL   MCH 30.4 26.0 - 34.0 pg   MCHC 31.5 30.0 - 36.0 g/dL   RDW 13.0 11.5 - 15.5 %   Platelets 316 150 - 400 K/uL   nRBC 0.0 0.0 - 0.2 %    Comment: Performed at [facility], [address] [place], [place] [address]  Brain natriuretic peptide     Status:  Abnormal   Collection Time: 02/19/22 11:01 AM  Result Value Ref Range   B Natriuretic Peptide 271.9 (H) 0.0 - 100.0 pg/mL    Comment: Performed at [facility], [address], [place], [place] [address]  ECHOCARDIOGRAM COMPLETE     Status: None  Collection Time: 02/19/22 11:56 AM  Result Value Ref Range   Weight 3,452.8 oz   Height 68 in   BP 123/74 mmHg   Ao pk vel 1.96 m/s   AV Area VTI 2.14 cm2   AR max vel 2.13 cm2   AV Mean grad 6.0 mmHg   AV Peak grad 15.4 mmHg   S' Lateral 2.71 cm   AV Area mean vel 2.35 cm2   Area-P 1/2 2.74 cm2   MV VTI 1.97 cm2  Basic metabolic panel     Status: None   Collection Time: 02/20/22  4:34 AM  Result Value Ref Range   Sodium 145 135 - 145 mmol/L   Potassium 3.7 3.5 - 5.1 mmol/L   Chloride 107 98 - 111 mmol/L   CO2 30 22 - 32 mmol/L   Glucose, Bld 94 70 - 99 mg/dL    Comment: Glucose reference range applies only to samples taken after fasting for at least 8 hours.   BUN 9 6 - 20 mg/dL   Creatinine, Ser 0.64 0.44 - 1.00 mg/dL   Calcium 9.2 8.9 - 10.3 mg/dL   GFR, Estimated >60 >60 mL/min    Comment: (NOTE) Calculated using the CKD-EPI Creatinine Equation (2021)    Anion gap 8 5 - 15    Comment: Performed at [facility], [address] [place], [place] [address]  CBC     Status: Abnormal   Collection Time: 02/20/22  4:34 AM  Result Value Ref Range   WBC 7.0 4.0 - 10.5 K/uL   RBC 3.34 (L) 3.87 - 5.11 MIL/uL   Hemoglobin 10.1 (L) 12.0 - 15.0 g/dL   HCT 31.4 (L) 36.0 - 46.0 %   MCV 94.0 80.0 - 100.0 fL   MCH 30.2 26.0 - 34.0 pg   MCHC 32.2 30.0 - 36.0 g/dL   RDW 12.8 11.5 - 15.5 %   Platelets 321 150 - 400 K/uL   nRBC 0.0 0.0 - 0.2 %    Comment: Performed at [facility], [address] [place],  [address]  COMPLETE METABOLIC PANEL WITH GFR     Status: Abnormal   Collection Time: 03/02/22  3:44 PM  Result Value Ref Range   Glucose, Bld 86 65 - 99 mg/dL    Comment: .            Fasting  reference interval .    BUN 12 7 - 25 mg/dL   Creat 0.63 0.50 - 1.03 mg/dL   eGFR 102 > OR = 60 mL/min/1.59m    Comment: The eGFR is based on the CKD-EPI 2021 equation. To calculate  the new eGFR from a previous Creatinine or Cystatin C result, go to https://www.kidney.org/professionals/ kdoqi/gfr%5Fcalculator    BUN/Creatinine Ratio NOT APPLICABLE 6 - 22 (calc)   Sodium 144 135 - 146 mmol/L   Potassium 4.8 3.5 - 5.3 mmol/L   Chloride 104 98 - 110 mmol/L   CO2 28 20 - 32 mmol/L   Calcium 10.6 (H) 8.6 - 10.4 mg/dL   Total Protein 7.5 6.1 - 8.1 g/dL   Albumin 4.6 3.6 - 5.1 g/dL   Globulin 2.9 1.9 - 3.7 g/dL (calc)   AG Ratio 1.6 1.0 - 2.5 (calc)   Total Bilirubin 0.5 0.2 - 1.2 mg/dL   Alkaline phosphatase (APISO) 112 37 - 153 U/L   AST 19 10 - 35 U/L   ALT 16 6 - 29 U/L  CBC with Differential/Platelet  Status: None   Collection Time: 03/02/22  3:44 PM  Result Value Ref Range   WBC 9.2 3.8 - 10.8 Thousand/uL   RBC 4.19 3.80 - 5.10 Million/uL   Hemoglobin 12.7 11.7 - 15.5 g/dL   HCT 38.7 35.0 - 45.0 %   MCV 92.4 80.0 - 100.0 fL   MCH 30.3 27.0 - 33.0 pg   MCHC 32.8 32.0 - 36.0 g/dL   RDW 12.3 11.0 - 15.0 %   Platelets 348 140 - 400 Thousand/uL   MPV 11.5 7.5 - 12.5 fL   Neutro Abs 5,097 1,500 - 7,800 cells/uL   Lymphs Abs 3,211 850 - 3,900 cells/uL   Absolute Monocytes 690 200 - 950 cells/uL   Eosinophils Absolute 156 15 - 500 cells/uL   Basophils Absolute 46 0 - 200 cells/uL   Neutrophils Relative % 55.4 %   Total Lymphocyte 34.9 %   Monocytes Relative 7.5 %   Eosinophils Relative 1.7 %   Basophils Relative 0.5 %      PHQ2/9:    03/19/2022   10:06 AM 03/02/2022    2:40 PM 02/05/2022    8:59 AM 11/06/2021    2:30 PM 06/04/2021    2:42 PM  Depression screen PHQ 2/9  Decreased Interest 1 0 0 1 0  Down, Depressed, Hopeless 1 0 0 1 0  PHQ - 2 Score 2 0 0 2 0  Altered sleeping 1 0 0 1 0  Tired, decreased energy 1 0 0 0 0  Change in appetite 0 0 0 0 0  Feeling bad  or failure about yourself  0 0 0 0 0  Trouble concentrating 0 0 0 0 0  Moving slowly or fidgety/restless 0 0 0 0 0  Suicidal thoughts 0 0 0 0 0  PHQ-9 Score 4 0 0 3 0  Difficult doing work/chores Somewhat difficult Not difficult at all Not difficult at all Not difficult at all     phq 9 is positive   Fall Risk:    03/19/2022   10:06 AM 03/02/2022    2:39 PM 02/05/2022    8:59 AM 11/06/2021    2:32 PM 06/04/2021    2:42 PM  Fall Risk   Falls in the past year? 1 1 0 0 1  Number falls in past yr: 0 0 0 0 0  Injury with Fall? 1 1 0 0 0  Risk for fall due to : History of fall(s) History of fall(s);Impaired balance/gait;Impaired mobility No Fall Risks No Fall Risks   Follow up Education provided;Falls evaluation completed;Falls prevention discussed Education provided;Falls prevention discussed;Falls evaluation completed Falls prevention discussed Falls prevention discussed       Functional Status Survey: Is the patient deaf or have difficulty hearing?: No Does the patient have difficulty seeing, even when wearing glasses/contacts?: No Does the patient have difficulty concentrating, remembering, or making decisions?: No Does the patient have difficulty walking or climbing stairs?: Yes Does the patient have difficulty dressing or bathing?: No Does the patient have difficulty doing errands alone such as visiting a doctor's office or shopping?: No    Assessment & Plan  Problem List Items Addressed This Visit     Prediabetes (Chronic)    Taking Metformin prn        Irritable bowel syndrome with both constipation and diarrhea    Stable on Elavil        Relevant Medications   amitriptyline (ELAVIL) 10 MG tablet   Major depression, recurrent, chronic ([place])  Under the care of Dr. Cherlyn Cushing       Relevant Medications   amitriptyline (ELAVIL) 10 MG tablet   desvenlafaxine (PRISTIQ) 50 MG 24 hr tablet   COPD, mild ([place])    She quit smoking and symptoms had improved but since  discharge from [facility] 01/2022 she has noticed increase in dry cough and SOB even at rest, explained it may be secondary to CHF and to keep follow up with cardiologist        Abdominal aortic atherosclerosis ([place])    Taking Atorvastatin        Relevant Medications   amLODipine (NORVASC) 5 MG tablet   losartan-hydrochlorothiazide (HYZAAR) 100-25 MG tablet   Chronic pain    Under the care of pain clinic        Relevant Medications   amitriptyline (ELAVIL) 10 MG tablet   desvenlafaxine (PRISTIQ) 50 MG 24 hr tablet   Pulmonary hypertension ([place])    Keep follow up next week with cardiologist Recent Echo done 01/2022 at [facility]       Relevant Medications   amLODipine (NORVASC) 5 MG tablet   losartan-hydrochlorothiazide (HYZAAR) 100-25 MG tablet   Diastolic heart failure ([place]) - Primary    Diagnosed during hospital stay April 2023 She will follow up with Dr. Mylo Red next week She has noticed increase in SOB and dry cough        Relevant Medications   amLODipine (NORVASC) 5 MG tablet   losartan-hydrochlorothiazide (HYZAAR) 100-25 MG tablet   Hypertension, benign   Relevant Medications   amLODipine (NORVASC) 5 MG tablet   losartan-hydrochlorothiazide (HYZAAR) 100-25 MG tablet

## 2022-03-18 NOTE — Telephone Encounter (Signed)
Spoke with Tiffany and clarified dx's ?

## 2022-03-19 ENCOUNTER — Encounter: Payer: Self-pay | Admitting: Family Medicine

## 2022-03-19 ENCOUNTER — Ambulatory Visit (INDEPENDENT_AMBULATORY_CARE_PROVIDER_SITE_OTHER): Payer: Medicare Other | Admitting: Family Medicine

## 2022-03-19 VITALS — BP 120/74 | HR 89 | Temp 97.9°F | Resp 18 | Ht 68.0 in | Wt 200.1 lb

## 2022-03-19 DIAGNOSIS — F339 Major depressive disorder, recurrent, unspecified: Secondary | ICD-10-CM

## 2022-03-19 DIAGNOSIS — J449 Chronic obstructive pulmonary disease, unspecified: Secondary | ICD-10-CM

## 2022-03-19 DIAGNOSIS — I10 Essential (primary) hypertension: Secondary | ICD-10-CM

## 2022-03-19 DIAGNOSIS — I5030 Unspecified diastolic (congestive) heart failure: Secondary | ICD-10-CM

## 2022-03-19 DIAGNOSIS — I700 Atherosclerosis of aorta: Secondary | ICD-10-CM

## 2022-03-19 DIAGNOSIS — I272 Pulmonary hypertension, unspecified: Secondary | ICD-10-CM | POA: Insufficient documentation

## 2022-03-19 DIAGNOSIS — Z4789 Encounter for other orthopedic aftercare: Secondary | ICD-10-CM | POA: Diagnosis not present

## 2022-03-19 DIAGNOSIS — I5033 Acute on chronic diastolic (congestive) heart failure: Secondary | ICD-10-CM | POA: Diagnosis not present

## 2022-03-19 DIAGNOSIS — R7303 Prediabetes: Secondary | ICD-10-CM | POA: Diagnosis not present

## 2022-03-19 DIAGNOSIS — G894 Chronic pain syndrome: Secondary | ICD-10-CM

## 2022-03-19 DIAGNOSIS — I4581 Long QT syndrome: Secondary | ICD-10-CM | POA: Diagnosis not present

## 2022-03-19 DIAGNOSIS — K582 Mixed irritable bowel syndrome: Secondary | ICD-10-CM | POA: Diagnosis not present

## 2022-03-19 DIAGNOSIS — G459 Transient cerebral ischemic attack, unspecified: Secondary | ICD-10-CM | POA: Diagnosis not present

## 2022-03-19 DIAGNOSIS — I110 Hypertensive heart disease with heart failure: Secondary | ICD-10-CM | POA: Diagnosis not present

## 2022-03-19 DIAGNOSIS — D649 Anemia, unspecified: Secondary | ICD-10-CM | POA: Diagnosis not present

## 2022-03-19 MED ORDER — LOSARTAN POTASSIUM-HCTZ 100-25 MG PO TABS: 1.0000 | ORAL_TABLET | Freq: Every day | ORAL | 0 refills | Status: DC

## 2022-03-19 MED ORDER — DESVENLAFAXINE SUCCINATE ER 50 MG PO TB24: 50.0000 mg | ORAL_TABLET | Freq: Every day | ORAL | 1 refills | Status: DC

## 2022-03-19 MED ORDER — AMITRIPTYLINE HCL 10 MG PO TABS: 10.0000 mg | ORAL_TABLET | Freq: Every day | ORAL | 1 refills | Status: DC

## 2022-03-19 MED ORDER — AMLODIPINE BESYLATE 5 MG PO TABS: 5.0000 mg | ORAL_TABLET | Freq: Every day | ORAL | 1 refills | Status: DC

## 2022-03-19 NOTE — Assessment & Plan Note (Signed)
She quit smoking and symptoms had improved but since discharge from [facility] 01/2022 she has noticed increase in dry cough and SOB even at rest, explained it may be secondary to CHF and to keep follow up with cardiologist

## 2022-03-19 NOTE — Assessment & Plan Note (Signed)
Keep follow up next week with cardiologist Recent Echo done 01/2022 at [facility]

## 2022-03-19 NOTE — Assessment & Plan Note (Signed)
Taking Metformin prn

## 2022-03-19 NOTE — Assessment & Plan Note (Signed)
Under the care of pain clinic

## 2022-03-19 NOTE — Assessment & Plan Note (Signed)
Stable on Elavil

## 2022-03-19 NOTE — Assessment & Plan Note (Signed)
Diagnosed during hospital stay April 2023 She will follow up with Dr. Mylo Red next week She has noticed increase in SOB and dry cough

## 2022-03-19 NOTE — Assessment & Plan Note (Signed)
Under the care of Dr. Cherlyn Cushing

## 2022-03-19 NOTE — Assessment & Plan Note (Signed)
Taking Atorvastatin

## 2022-03-23 ENCOUNTER — Encounter: Payer: Self-pay | Admitting: Cardiology

## 2022-03-23 ENCOUNTER — Ambulatory Visit (INDEPENDENT_AMBULATORY_CARE_PROVIDER_SITE_OTHER): Payer: Medicare Other | Admitting: Cardiology

## 2022-03-23 ENCOUNTER — Ambulatory Visit (INDEPENDENT_AMBULATORY_CARE_PROVIDER_SITE_OTHER): Payer: Medicare Other

## 2022-03-23 ENCOUNTER — Telehealth: Payer: Self-pay

## 2022-03-23 VITALS — BP 120/72 | HR 89 | Ht 68.0 in | Wt 204.0 lb

## 2022-03-23 DIAGNOSIS — I10 Essential (primary) hypertension: Secondary | ICD-10-CM | POA: Diagnosis not present

## 2022-03-23 DIAGNOSIS — I5030 Unspecified diastolic (congestive) heart failure: Secondary | ICD-10-CM

## 2022-03-23 DIAGNOSIS — G459 Transient cerebral ischemic attack, unspecified: Secondary | ICD-10-CM

## 2022-03-23 MED ORDER — FUROSEMIDE 40 MG PO TABS: 20.0000 mg | ORAL_TABLET | Freq: Every day | ORAL | 3 refills | Status: DC

## 2022-03-23 NOTE — Progress Notes (Signed)
Cardiology Office Note:    Date:  03/23/2022   ID:  [name], DOB 1961-01-21, MRN [id number]  PCP:  Steele Sizer, MD   [facility] Providers Cardiologist:  Kate Sable, MD     Referring MD: Delsa Grana, PA-C   Chief Complaint  Patient presents with   NEW patient-Referred by PCP     TIA/CHF/left ankle swelling    History of Present Illness:    [name] is a 61 y.o. female with a hx of hypertension, hyperlipidemia, diabetes, former smoker x15+ years, COPD who presents due to leg edema.  She was recently admitted 02/18/2022 due to facial droop.  Symptoms resolved upon ED arrival.  Diagnosed with TIA, started on aspirin and Lipitor.  She states having symptoms of leg swelling ongoing for about a year.  Denies palpitations, denies chest pain.  EGD is being planned next week due to history of reflux.  Also endorses orthopnea.  Echocardiogram 02/19/2022 EF 60 to 02%, grade 2 diastolic dysfunction.  Past Medical History:  Diagnosis Date   Abdominal aortic atherosclerosis (HCC)    Anemia    Arthritis    bilateral knees   Bronchitis    Carpal tunnel syndrome, bilateral    Contact dermatitis and other eczema, due to unspecified cause    COPD (chronic obstructive pulmonary disease) (HCC)    Depressive disorder    Diverticulosis    Dysmetabolic syndrome X    Eczema    GERD (gastroesophageal reflux disease)    Hyperlipidemia    Hypertension    IBS (irritable bowel syndrome)    Insomnia    Lumbago    Nonspecific abnormal electrocardiogram (ECG) (EKG)    Osteoarthritis of both knees    Other ovarian failure(256.39)    Peptic ulcer    Postmenopausal atrophic vaginitis    Symptomatic menopausal or female climacteric states    Unspecified vitamin D deficiency     Past Surgical History:  Procedure Laterality Date   ACHILLES TENDON SURGERY Left 01/02/2022   Procedure: ACHILLES TENDON REPAIR-SECONDARY;  Surgeon: Samara Deist, DPM;  Location: [facility] ORS;   Service: Podiatry;  Laterality: Left;   BACK SURGERY     BREAST EXCISIONAL BIOPSY Left ?   neg   BREAST LUMPECTOMY Left 01/08/2021   Procedure: BREAST LUMPECTOMY;  Surgeon: Ronny Bacon, MD;  Location: [facility] ORS;  Service: General;  Laterality: Left;   CARPAL TUNNEL RELEASE Left 08/15/2018   Procedure: CARPAL TUNNEL RELEASE;  Surgeon: Earnestine Leys, MD;  Location: [facility] ORS;  Service: Orthopedics;  Laterality: Left;   CERVICAL DISCECTOMY     x 2; metal plate   CHOLECYSTECTOMY     COLONOSCOPY WITH PROPOFOL N/A 08/08/2018   Procedure: COLONOSCOPY WITH PROPOFOL;  Surgeon: Virgel Manifold, MD;  Location: [facility] ENDOSCOPY;  Service: Endoscopy;  Laterality: N/A;   COLONOSCOPY WITH PROPOFOL N/A 12/01/2021   Procedure: COLONOSCOPY WITH PROPOFOL;  Surgeon: Lin Landsman, MD;  Location: [facility] ENDOSCOPY;  Service: Gastroenterology;  Laterality: N/A;   DILATATION & CURETTAGE/HYSTEROSCOPY WITH MYOSURE N/A 03/21/2015   Procedure: DILATATION & CURETTAGE/HYSTEROSCOPY;  Surgeon: Aletha Halim, MD;  Location: [facility] ORS;  Service: Gynecology;  Laterality: N/A;   DILATION AND CURETTAGE OF UTERUS     ENDOMETRIAL ABLATION     ESOPHAGOGASTRODUODENOSCOPY (EGD) WITH PROPOFOL N/A 04/13/2018   Procedure: ESOPHAGOGASTRODUODENOSCOPY (EGD) WITH PROPOFOL;  Surgeon: Virgel Manifold, MD;  Location: [facility] ENDOSCOPY;  Service: Endoscopy;  Laterality: N/A;   ESOPHAGOGASTRODUODENOSCOPY (EGD) WITH PROPOFOL N/A 08/08/2018   Procedure: ESOPHAGOGASTRODUODENOSCOPY (  EGD) WITH PROPOFOL;  Surgeon: Pasty Spillers, MD;  Location: [facility] ENDOSCOPY;  Service: Endoscopy;  Laterality: N/A;   JOINT REPLACEMENT     OSTECTOMY Left 01/02/2022   Procedure: CALCANEAL EXOSTECTOMY;  Surgeon: Gwyneth Revels, DPM;  Location: [facility] OR[facility];  Service: Podiatry;  Laterality: Left;   SPINAL FUSION     lumbar x2   TONSILLECTOMY     TOTAL KNEE ARTHROPLASTY Left 04/12/2019   Procedure: TOTAL KNEE ARTHROPLASTY;  Surgeon: Lyndle Herrlich, MD;   Location: [facility] ORS;  Service: Orthopedics;  Laterality: Left;   TOTAL KNEE ARTHROPLASTY Right 04/17/2020   Procedure: TOTAL KNEE ARTHROPLASTY;  Surgeon: Lyndle Herrlich, MD;  Location: [facility];  Service: Orthopedics;  Laterality: Right;   TUBAL LIGATION      Current Medications: Current Meds  Medication Sig   amitriptyline (ELAVIL) 10 MG tablet Take 1 tablet (10 mg total) by mouth at bedtime.   amLODipine (NORVASC) 5 MG tablet Take 1 tablet (5 mg total) by mouth daily.   aspirin EC 81 MG EC tablet Take 1 tablet (81 mg total) by mouth daily. Swallow whole.   atorvastatin (LIPITOR) 40 MG tablet Take 1 tablet (40 mg total) by mouth at bedtime.   cholecalciferol (VITAMIN D) 1000 units tablet Take 1,000 Units by mouth daily.   Cyanocobalamin (B-12) 1000 MCG SUBL Place 1 each under the tongue 3 (three) times a week.   desvenlafaxine (PRISTIQ) 50 MG 24 hr tablet Take 1 tablet (50 mg total) by mouth daily.   famotidine (PEPCID) 20 MG tablet TAKE 1 TABLET(20 MG) BY MOUTH TWICE DAILY AS NEEDED FOR HEARTBURN OR INDIGESTION   furosemide (LASIX) 40 MG tablet Take 0.5 tablets (20 mg total) by mouth daily.   gabapentin (NEURONTIN) 300 MG capsule Take 300 mg by mouth every 6 (six) hours as needed (Nerve pain).   losartan-hydrochlorothiazide (HYZAAR) 100-25 MG tablet Take 1 tablet by mouth daily.   metFORMIN (GLUCOPHAGE) 500 MG tablet Take 1 tablet (500 mg total) by mouth daily with breakfast.   Multiple Vitamins-Minerals (MULTIVITAMIN WITH MINERALS) tablet Take 1 tablet by mouth every other day.   NARCAN 4 MG/0.1ML LIQD nasal spray kit Place 1 spray into the nose once.   Oxycodone HCl 20 MG TABS Take 20 mg by mouth every 4 (four) hours as needed (pain).   pantoprazole (PROTONIX) 40 MG tablet Take 1 tablet (40 mg total) by mouth daily.   QUEtiapine (SEROQUEL) 25 MG tablet Take 1 tablet (25 mg total) by mouth at bedtime.   tiZANidine (ZANAFLEX) 4 MG tablet Take 4 mg by mouth every 8 (eight) hours.    valACYclovir (VALTREX) 1000 MG tablet Take 1,000 mg by mouth 2 (two) times daily as needed.     Allergies:   Patient has no known allergies.   Social History   Socioeconomic History   Marital status: Married    Spouse name: Remi Deter   Number of children: 4   Years of education: Not on file   Highest education level: 12th grade  Occupational History   Occupation: disabled    Comment: chronic back pain   Tobacco Use   Smoking status: Former    Packs/day: 1.00    Years: 20.00    Pack years: 20.00    Types: Cigarettes    Start date: 07/26/2002    Quit date: 04/06/2019    Years since quitting: 2.9   Smokeless tobacco: Never   Tobacco comments:    Quit Date 04/06/2019  Vaping Use  Vaping Use: Never used  Substance and Sexual Activity   Alcohol use: Yes    Comment: occasional beer   Drug use: No   Sexual activity: Yes    Partners: Male    Birth control/protection: Other-see comments    Comment: Ablation  Other Topics Concern   Not on file  Social History Narrative   Not on file   Social Determinants of Health   Financial Resource Strain: Low Risk    Difficulty of Paying Living Expenses: Not hard at all  Food Insecurity: No Food Insecurity   Worried About Programme researcher, broadcasting/film/video in the Last Year: Never true   Ran Out of Food in the Last Year: Never true  Transportation Needs: No Transportation Needs   Lack of Transportation (Medical): No   Lack of Transportation (Non-Medical): No  Physical Activity: Inactive   Days of Exercise per Week: 0 days   Minutes of Exercise per Session: 0 min  Stress: No Stress Concern Present   Feeling of Stress : Not at all  Social Connections: Moderately Isolated   Frequency of Communication with Friends and Family: More than three times a week   Frequency of Social Gatherings with Friends and Family: Three times a week   Attends Religious Services: Never   Active Member of Clubs or Organizations: No   Attends Engineer, structural:  Never   Marital Status: Married     Family History: The patient's family history includes Asthma in her daughter; Breast cancer in her maternal grandmother; Congestive Heart Failure in her mother; Depression in her daughter; Heart disease in her mother; Heart failure in her maternal grandfather and maternal uncle; Hypertension in her father; Thyroid cancer in her mother.  ROS:   Please see the history of present illness.     All other systems reviewed and are negative.  EKGs/Labs/Other Studies Reviewed:    The following studies were reviewed today:   EKG:  EKG is  ordered today.  The ekg ordered today demonstrates normal sinus rhythm.  Recent Labs: 02/05/2022: TSH 0.90 02/18/2022: Magnesium 1.8 02/19/2022: B Natriuretic Peptide 271.9 03/02/2022: ALT 16; BUN 12; Creat 0.63; Hemoglobin 12.7; Platelets 348; Potassium 4.8; Sodium 144  Recent Lipid Panel    Component Value Date/Time   CHOL 120 02/19/2022 0339   TRIG 258 (H) 02/19/2022 0339   HDL 34 (L) 02/19/2022 0339   CHOLHDL 3.5 02/19/2022 0339   VLDL 52 (H) 02/19/2022 0339   LDLCALC 34 02/19/2022 0339   LDLCALC 34 08/07/2020 1247     Risk Assessment/Calculations:          Physical Exam:    VS:  BP 120/72 (BP Location: Right Arm, Patient Position: Sitting, Cuff Size: Large)   Pulse 89   Ht 5\' 8"  (1.727 m)   Wt 204 lb (92.5 kg)   SpO2 96%   BMI 31.02 kg/m     Wt Readings from Last 3 Encounters:  03/23/22 204 lb (92.5 kg)  03/19/22 200 lb 1.6 oz (90.8 kg)  03/16/22 200 lb 4 oz (90.8 kg)     GEN:  Well nourished, well developed in no acute distress HEENT: Normal NECK: No JVD; No carotid bruits LYMPHATICS: No lymphadenopathy CARDIAC: RRR, no murmurs, rubs, gallops RESPIRATORY:  Clear to auscultation without rales, wheezing or rhonchi  ABDOMEN: Soft, non-tender, non-distended MUSCULOSKELETAL:  1-2+ edema; No deformity  SKIN: Warm and dry NEUROLOGIC:  Alert and oriented x 3 PSYCHIATRIC:  Normal affect    ASSESSMENT:  1. Heart failure with preserved ejection fraction, unspecified HF chronicity ([place])   2. Primary hypertension   3. TIA (transient ischemic attack)    PLAN:    In order of problems listed above:  HFpEF, grade 2 diastolic dysfunction, orthopnea, edema.  Start Lasix 20 mg daily, monitor BP at home.  If BP runs low, low threshold to stop Norvasc.  Check BMP in 1 week Hypertension, continue losartan, HCTZ, Norvasc 5 mg daily.  Lasix as above. History of TIA, place cardiac monitor to evaluate presence of A-fib or flutter.  Continue aspirin, Lipitor.  Follow-up with primary care physician regarding neurology referral.  Follow-up after cardiac monitor.       Medication Adjustments/Labs and Tests Ordered: Current medicines are reviewed at length with the patient today.  Concerns regarding medicines are outlined above.  Orders Placed This Encounter  Procedures   Basic metabolic panel   LONG TERM MONITOR (3-14 DAYS)   EKG 12-Lead   Meds ordered this encounter  Medications   furosemide (LASIX) 40 MG tablet    Sig: Take 0.5 tablets (20 mg total) by mouth daily.    Dispense:  30 tablet    Refill:  3    Patient Instructions  Medication Instructions:   Your physician has recommended you make the following change in your medication:    START taking Furosemide (Lasix) 20 MG once a day.  *If you need a refill on your cardiac medications before your next appointment, please call your pharmacy*   Lab Work:  Your physician recommends that you return for lab work (BMP) in: 1 week  - Please go to the [facility] You will check in at the front desk to the right as you walk into the atrium. Valet Parking is offered if needed. - No appointment needed. You may go any day between 7 am and 6 pm.      Testing/Procedures:  Your physician has recommended that you wear a Zio XT monitor for 2 weeks. This will be mailed to your home address in 4-5 business days.   This  monitor is a medical device that records the heart's electrical activity. Doctors most often use these monitors to diagnose arrhythmias. Arrhythmias are problems with the speed or rhythm of the heartbeat. The monitor is a small device applied to your chest. You can wear one while you do your normal daily activities. While wearing this monitor if you have any symptoms to push the button and record what you felt. Once you have worn this monitor for the period of time provider prescribed (Usually 14 days), you will return the monitor device in the postage paid box. Once it is returned they will download the data collected and provide [place] with a report which the provider will then review and we will call you with those results. Important tips:  Avoid showering during the first 24 hours of wearing the monitor. Avoid excessive sweating to help maximize wear time. Do not submerge the device, no hot tubs, and no swimming pools. Keep any lotions or oils away from the patch. After 24 hours you may shower with the patch on. Take brief showers with your back facing the shower head.  Do not remove patch once it has been placed because that will interrupt data and decrease adhesive wear time. Push the button when you have any symptoms and write down what you were feeling. Once you have completed wearing your monitor, remove and place into box which has postage paid  and place in your outgoing mailbox.  If for some reason you have misplaced your box then call our office and we can provide another box and/or mail it off for you.    Follow-Up: At [facility], you and your health needs are our priority.  As part of our continuing mission to provide you with exceptional heart care, we have created designated Provider Care Teams.  These Care Teams include your primary Cardiologist (physician) and Advanced Practice Providers (APPs -  Physician Assistants and Nurse Practitioners) who all work together to provide you with  the care you need, when you need it.  We recommend signing up for the patient portal called "MyChart".  Sign up information is provided on this After Visit Summary.  MyChart is used to connect with patients for Virtual Visits (Telemedicine).  Patients are able to view lab/test results, encounter notes, upcoming appointments, etc.  Non-urgent messages can be sent to your provider as well.   To learn more about what you can do with MyChart, go to [email or web address].    Your next appointment:   6 week(s)  The format for your next appointment:   In Person  Provider:   You may see Kate Sable, MD or one of the following Advanced Practice Providers on your designated Care Team:   Murray Hodgkins, NP Christell Faith, PA-C Cadence Kathlen Mody, [place]    Other Instructions   Important Information About Sugar         Signed, Kate Sable, MD  03/23/2022 3:30 PM    [place]

## 2022-03-23 NOTE — Telephone Encounter (Signed)
Copied from [place] [phone]. Topic: Referral - Request for Referral >> Mar 23, 2022  3:14 PM Loma [place] wrote: Has patient seen PCP for this complaint? Yes.   *If NO, is insurance requiring patient see PCP for this issue before PCP can refer them? Referral for which specialty: Neurologist Preferred provider/office: Any Reason for referral: Pt is calling after just leaving from the cardiologist and she states that he told her to get Dr Ancil Boozer to refer her to a neurologist. Best # for pt to be reached at or referral fu is her [phone]. Dr Ancil Boozer just saw pt on 03/19/22. Would like a fu call when referral made [phone]

## 2022-03-23 NOTE — Patient Instructions (Addendum)
Medication Instructions:   Your physician has recommended you make the following change in your medication:    START taking Furosemide (Lasix) 20 MG once a day.  *If you need a refill on your cardiac medications before your next appointment, please call your pharmacy*   Lab Work:  Your physician recommends that you return for lab work (BMP) in: 1 week  - Please go to the [facility]. You will check in at the front desk to the right as you walk into the atrium. Valet Parking is offered if needed. - No appointment needed. You may go any day between 7 am and 6 pm.      Testing/Procedures:  Your physician has recommended that you wear a Zio XT monitor for 2 weeks. This will be mailed to your home address in 4-5 business days.   This monitor is a medical device that records the heart's electrical activity. Doctors most often use these monitors to diagnose arrhythmias. Arrhythmias are problems with the speed or rhythm of the heartbeat. The monitor is a small device applied to your chest. You can wear one while you do your normal daily activities. While wearing this monitor if you have any symptoms to push the button and record what you felt. Once you have worn this monitor for the period of time provider prescribed (Usually 14 days), you will return the monitor device in the postage paid box. Once it is returned they will download the data collected and provide [place] with a report which the provider will then review and we will call you with those results. Important tips:  Avoid showering during the first 24 hours of wearing the monitor. Avoid excessive sweating to help maximize wear time. Do not submerge the device, no hot tubs, and no swimming pools. Keep any lotions or oils away from the patch. After 24 hours you may shower with the patch on. Take brief showers with your back facing the shower head.  Do not remove patch once it has been placed because that will interrupt data and  decrease adhesive wear time. Push the button when you have any symptoms and write down what you were feeling. Once you have completed wearing your monitor, remove and place into box which has postage paid and place in your outgoing mailbox.  If for some reason you have misplaced your box then call our office and we can provide another box and/or mail it off for you.    Follow-Up: At [facility], you and your health needs are our priority.  As part of our continuing mission to provide you with exceptional heart care, we have created designated Provider Care Teams.  These Care Teams include your primary Cardiologist (physician) and Advanced Practice Providers (APPs -  Physician Assistants and Nurse Practitioners) who all work together to provide you with the care you need, when you need it.  We recommend signing up for the patient portal called "MyChart".  Sign up information is provided on this After Visit Summary.  MyChart is used to connect with patients for Virtual Visits (Telemedicine).  Patients are able to view lab/test results, encounter notes, upcoming appointments, etc.  Non-urgent messages can be sent to your provider as well.   To learn more about what you can do with MyChart, go to [email or web address].    Your next appointment:   6 week(s)  The format for your next appointment:   In Person  Provider:   You may see Kate Sable, MD or one  of the following Advanced Practice Providers on your designated Care Team:   Murray Hodgkins, NP Christell Faith, PA-C Cadence Kathlen Mody, [place]    Other Instructions   Important Information About Sugar

## 2022-03-24 ENCOUNTER — Telehealth: Payer: Self-pay | Admitting: Family Medicine

## 2022-03-24 ENCOUNTER — Encounter: Payer: Self-pay | Admitting: Gastroenterology

## 2022-03-24 DIAGNOSIS — G459 Transient cerebral ischemic attack, unspecified: Secondary | ICD-10-CM

## 2022-03-24 DIAGNOSIS — Z4789 Encounter for other orthopedic aftercare: Secondary | ICD-10-CM | POA: Diagnosis not present

## 2022-03-24 DIAGNOSIS — D649 Anemia, unspecified: Secondary | ICD-10-CM | POA: Diagnosis not present

## 2022-03-24 DIAGNOSIS — I5033 Acute on chronic diastolic (congestive) heart failure: Secondary | ICD-10-CM | POA: Diagnosis not present

## 2022-03-24 DIAGNOSIS — I110 Hypertensive heart disease with heart failure: Secondary | ICD-10-CM | POA: Diagnosis not present

## 2022-03-24 DIAGNOSIS — I4581 Long QT syndrome: Secondary | ICD-10-CM | POA: Diagnosis not present

## 2022-03-24 NOTE — Telephone Encounter (Signed)
Completed.

## 2022-03-24 NOTE — Telephone Encounter (Signed)
Home Health Verbal Orders - Caller/Agency: Sherlynn Stalls / [place] Number: [phone] Requesting OT Frequency: needs to make up missed visit last week / wants to make up the visit today

## 2022-03-25 ENCOUNTER — Encounter: Payer: Self-pay | Admitting: Gastroenterology

## 2022-03-25 ENCOUNTER — Ambulatory Visit: Payer: Medicare Other | Admitting: Anesthesiology

## 2022-03-25 ENCOUNTER — Encounter
Admission: RE | Disposition: A | Discharge status: Home / Self Care | Payer: Self-pay | Source: Home / Self Care | Attending: Gastroenterology

## 2022-03-25 ENCOUNTER — Ambulatory Visit
Admission: RE | Admit: 2022-03-25 | Discharge: 2022-03-25 | Disposition: A | Discharge status: Home / Self Care | Payer: Medicare Other | Attending: Gastroenterology | Admitting: Gastroenterology

## 2022-03-25 ENCOUNTER — Other Ambulatory Visit: Payer: Self-pay

## 2022-03-25 ENCOUNTER — Telehealth: Payer: Medicare Other

## 2022-03-25 DIAGNOSIS — K3189 Other diseases of stomach and duodenum: Secondary | ICD-10-CM

## 2022-03-25 DIAGNOSIS — K219 Gastro-esophageal reflux disease without esophagitis: Secondary | ICD-10-CM | POA: Diagnosis not present

## 2022-03-25 DIAGNOSIS — M1990 Unspecified osteoarthritis, unspecified site: Secondary | ICD-10-CM | POA: Diagnosis not present

## 2022-03-25 DIAGNOSIS — Z7982 Long term (current) use of aspirin: Secondary | ICD-10-CM | POA: Insufficient documentation

## 2022-03-25 DIAGNOSIS — Z8673 Personal history of transient ischemic attack (TIA), and cerebral infarction without residual deficits: Secondary | ICD-10-CM | POA: Diagnosis not present

## 2022-03-25 DIAGNOSIS — J449 Chronic obstructive pulmonary disease, unspecified: Secondary | ICD-10-CM | POA: Diagnosis not present

## 2022-03-25 DIAGNOSIS — Z79899 Other long term (current) drug therapy: Secondary | ICD-10-CM | POA: Diagnosis not present

## 2022-03-25 DIAGNOSIS — I110 Hypertensive heart disease with heart failure: Secondary | ICD-10-CM | POA: Insufficient documentation

## 2022-03-25 DIAGNOSIS — E669 Obesity, unspecified: Secondary | ICD-10-CM | POA: Diagnosis not present

## 2022-03-25 DIAGNOSIS — I5030 Unspecified diastolic (congestive) heart failure: Secondary | ICD-10-CM | POA: Insufficient documentation

## 2022-03-25 DIAGNOSIS — Z6830 Body mass index (BMI) 30.0-30.9, adult: Secondary | ICD-10-CM | POA: Diagnosis not present

## 2022-03-25 DIAGNOSIS — Z87891 Personal history of nicotine dependence: Secondary | ICD-10-CM | POA: Insufficient documentation

## 2022-03-25 DIAGNOSIS — F32A Depression, unspecified: Secondary | ICD-10-CM | POA: Diagnosis not present

## 2022-03-25 HISTORY — DX: Other cervical disc degeneration, unspecified cervical region: M50.30

## 2022-03-25 HISTORY — PX: ESOPHAGOGASTRODUODENOSCOPY (EGD) WITH PROPOFOL: SHX5813

## 2022-03-25 SURGERY — ESOPHAGOGASTRODUODENOSCOPY (EGD) WITH PROPOFOL
Anesthesia: General

## 2022-03-25 MED ORDER — PROPOFOL 500 MG/50ML IV EMUL
INTRAVENOUS | Status: DC | PRN
  Administered 2022-03-25: 175 ug/kg/min via INTRAVENOUS

## 2022-03-25 MED ORDER — SODIUM CHLORIDE 0.9 % IV SOLN: INTRAVENOUS | Status: DC

## 2022-03-25 MED ORDER — PROPOFOL 500 MG/50ML IV EMUL
INTRAVENOUS | Status: AC
  Filled 2022-03-25: qty 50

## 2022-03-25 MED ORDER — LIDOCAINE HCL (CARDIAC) PF 100 MG/5ML IV SOSY
PREFILLED_SYRINGE | INTRAVENOUS | Status: DC | PRN
  Administered 2022-03-25: 40 mg via INTRAVENOUS

## 2022-03-25 MED ORDER — DEXMEDETOMIDINE (PRECEDEX) IN NS 20 MCG/5ML (4 MCG/ML) IV SYRINGE
PREFILLED_SYRINGE | INTRAVENOUS | Status: DC | PRN
  Administered 2022-03-25: 12 ug via INTRAVENOUS

## 2022-03-25 MED ORDER — PROPOFOL 10 MG/ML IV BOLUS
INTRAVENOUS | Status: DC | PRN
  Administered 2022-03-25: 90 mg via INTRAVENOUS

## 2022-03-25 NOTE — H&P (Signed)
Cephas Darby, MD [address]  [place]  [place], [place] [address]  Main: [phone]  Fax: [phone] Pager: [phone]  Primary Care Physician:  Steele Sizer, MD Primary Gastroenterologist:  Dr. Cephas Darby  Pre-Procedure History & Physical: HPI:  [name] is a 61 y.o. female is here for an endoscopy.   Past Medical History:  Diagnosis Date   Abdominal aortic atherosclerosis (HCC)    Anemia    Arthritis    bilateral knees   Bronchitis    Carpal tunnel syndrome, bilateral    Contact dermatitis and other eczema, due to unspecified cause    COPD (chronic obstructive pulmonary disease) (HCC)    DDD (degenerative disc disease)    Depressive disorder    Diverticulosis    Dysmetabolic syndrome X    Eczema    GERD (gastroesophageal reflux disease)    Hyperlipidemia    Hypertension    IBS (irritable bowel syndrome)    Insomnia    Lumbago    Nonspecific abnormal electrocardiogram (ECG) (EKG)    Osteoarthritis of both knees    Other ovarian failure(256.39)    Peptic ulcer    Postmenopausal atrophic vaginitis    Symptomatic menopausal or female climacteric states    Unspecified vitamin D deficiency     Past Surgical History:  Procedure Laterality Date   ACHILLES TENDON SURGERY Left 01/02/2022   Procedure: ACHILLES TENDON REPAIR-SECONDARY;  Surgeon: Samara Deist, DPM;  Location: [facility] ORS;  Service: Podiatry;  Laterality: Left;   BACK SURGERY     BREAST EXCISIONAL BIOPSY Left ?   neg   BREAST LUMPECTOMY Left 01/08/2021   Procedure: BREAST LUMPECTOMY;  Surgeon: Ronny Bacon, MD;  Location: [facility] ORS;  Service: General;  Laterality: Left;   CARPAL TUNNEL RELEASE Left 08/15/2018   Procedure: CARPAL TUNNEL RELEASE;  Surgeon: Earnestine Leys, MD;  Location: [facility] ORS;  Service: Orthopedics;  Laterality: Left;   CERVICAL DISCECTOMY     x 2; metal plate   CHOLECYSTECTOMY     COLONOSCOPY WITH PROPOFOL N/A 08/08/2018   Procedure: COLONOSCOPY WITH  PROPOFOL;  Surgeon: Virgel Manifold, MD;  Location: [facility] ENDOSCOPY;  Service: Endoscopy;  Laterality: N/A;   COLONOSCOPY WITH PROPOFOL N/A 12/01/2021   Procedure: COLONOSCOPY WITH PROPOFOL;  Surgeon: Lin Landsman, MD;  Location: [facility] END[facility]COPY;  Service: Gastroenterology;  Laterality: N/A;   DILATATION & CURETTAGE/HYSTEROSCOPY WITH MYOSURE N/A 03/21/2015   Procedure: DILATATION & CURETTAGE/HYSTEROSCOPY;  Surgeon: Aletha Halim, MD;  Location: [facility] OR[facility];  Service: Gynecology;  Laterality: N/A;   DILATION AND CURETTAGE OF UTERUS     ENDOMETRIAL ABLATION     ESOPHAGOGASTRODUODENOSCOPY (EGD) WITH PROPOFOL N/A 04/13/2018   Procedure: ESOPHAGOGASTRODUODENOSCOPY (EGD) WITH PROPOFOL;  Surgeon: Virgel Manifold, MD;  Location: [facility] ENDOSCOPY;  Service: Endoscopy;  Laterality: N/A;   ESOPHAGOGASTRODUODENOSCOPY (EGD) WITH PROPOFOL N/A 08/08/2018   Procedure: ESOPHAGOGASTRODUODENOSCOPY (EGD) WITH PROPOFOL;  Surgeon: Virgel Manifold, MD;  Location: [facility] ENDOSCOPY;  Service: Endoscopy;  Laterality: N/A;   JOINT REPLACEMENT     OSTECTOMY Left 01/02/2022   Procedure: CALCANEAL EXOSTECTOMY;  Surgeon: Samara Deist, DPM;  Location: [facility] OR[facility];  Service: Podiatry;  Laterality: Left;   SPINAL FUSION     lumbar x2   TONSILLECTOMY     TOTAL KNEE ARTHROPLASTY Left 04/12/2019   Procedure: TOTAL KNEE ARTHROPLASTY;  Surgeon: Lovell Sheehan, MD;  Location: [facility];  Service: Orthopedics;  Laterality: Left;   TOTAL KNEE ARTHROPLASTY Right 04/17/2020   Procedure: TOTAL KNEE ARTHROPLASTY;  Surgeon: Lovell Sheehan, MD;  Location: [facility];  Service: Orthopedics;  Laterality: Right;   TUBAL LIGATION      Prior to Admission medications   Medication Sig Start Date End Date Taking? Authorizing Provider  amitriptyline (ELAVIL) 10 MG tablet Take 1 tablet (10 mg total) by mouth at bedtime. 03/19/22  Yes Sowles, Drue Stager, MD  amLODipine (NORVASC) 5 MG tablet Take 1 tablet (5 mg total) by mouth daily. 03/19/22  Yes  Steele Sizer, MD  aspirin EC 81 MG EC tablet Take 1 tablet (81 mg total) by mouth daily. Swallow whole. 02/21/22  Yes Annita Brod, MD  atorvastatin (LIPITOR) 40 MG tablet Take 1 tablet (40 mg total) by mouth at bedtime. 03/02/22  Yes Delsa Grana, PA-C  famotidine (PEPCID) 20 MG tablet TAKE 1 TABLET(20 MG) BY MOUTH TWICE DAILY AS NEEDED FOR HEARTBURN OR INDIGESTION 03/05/22  Yes Delsa Grana, PA-C  furosemide (LASIX) 40 MG tablet Take 0.5 tablets (20 mg total) by mouth daily. 03/23/22 06/22/23 Yes Agbor-Etang, Aaron Edelman, MD  gabapentin (NEURONTIN) 300 MG capsule Take 300 mg by mouth every 6 (six) hours as needed (Nerve pain).   Yes Shanon Ace, MD  losartan-hydrochlorothiazide (HYZAAR) 100-25 MG tablet Take 1 tablet by mouth daily. 03/19/22  Yes Sowles, Drue Stager, MD  metFORMIN (GLUCOPHAGE) 500 MG tablet Take 1 tablet (500 mg total) by mouth daily with breakfast. 06/04/21  Yes Sowles, Drue Stager, MD  Multiple Vitamins-Minerals (MULTIVITAMIN WITH MINERALS) tablet Take 1 tablet by mouth every other day.   Yes [provider]  Oxycodone HCl 20 MG TABS Take 20 mg by mouth every 4 (four) hours as needed (pain). 07/16/20  Yes [facility]  pantoprazole (PROTONIX) 40 MG tablet Take 1 tablet (40 mg total) by mouth daily. 03/02/22  Yes Delsa Grana, PA-C  QUEtiapine (SEROQUEL) 25 MG tablet Take 1 tablet (25 mg total) by mouth at bedtime. 02/05/22  Yes Delsa Grana, PA-C  tiZANidine (ZANAFLEX) 4 MG tablet Take 4 mg by mouth every 8 (eight) hours. 02/13/22  Yes [provider]  cholecalciferol (VITAMIN D) 1000 units tablet Take 1,000 Units by mouth daily.    [provider]  Cyanocobalamin (B-12) 1000 MCG SUBL Place 1 each under the tongue 3 (three) times a week.    [provider]  desvenlafaxine (PRISTIQ) 50 MG 24 hr tablet Take 1 tablet (50 mg total) by mouth daily. 03/19/22   Steele Sizer, MD  NARCAN 4 MG/0.1ML LIQD nasal spray kit Place 1 spray into the  nose once. 03/14/19   Shanon Ace, MD  valACYclovir (VALTREX) 1000 MG tablet Take 1,000 mg by mouth 2 (two) times daily as needed.    [provider]    Allergies as of 03/16/2022   (No Known Allergies)    Family History  Problem Relation Age of Onset   Heart disease Mother    Thyroid cancer Mother    Congestive Heart Failure Mother    Hypertension Father    Heart failure Maternal Uncle    Breast cancer Maternal Grandmother    Heart failure Maternal Grandfather    Depression Daughter    Asthma Daughter     Social History   Socioeconomic History   Marital status: Married    Spouse name: Mikeal Hawthorne   Number of children: 4   Years of education: Not on file   Highest education level: 12th grade  Occupational History   Occupation: disabled    Comment: chronic back pain   Tobacco Use  Smoking status: Former    Packs/day: 1.00    Years: 20.00    Pack years: 20.00    Types: Cigarettes    Start date: 07/26/2002    Quit date: 04/06/2019    Years since quitting: 2.9   Smokeless tobacco: Never   Tobacco comments:    Quit Date 04/06/2019  Vaping Use   Vaping Use: Never used  Substance and Sexual Activity   Alcohol use: Yes    Comment: occasional beer   Drug use: No   Sexual activity: Yes    Partners: Male    Birth control/protection: Other-see comments    Comment: Ablation  Other Topics Concern   Not on file  Social History Narrative   Not on file   Social Determinants of Health   Financial Resource Strain: Low Risk    Difficulty of Paying Living Expenses: Not hard at all  Food Insecurity: No Food Insecurity   Worried About Charity fundraiser in the Last Year: Never true   [place] in the Last Year: Never true  Transportation Needs: No Transportation Needs   Lack of Transportation (Medical): No   Lack of Transportation (Non-Medical): No  Physical Activity: Inactive   Days of Exercise per Week: 0 days   Minutes of Exercise per Session: 0 min   Stress: No Stress Concern Present   Feeling of Stress : Not at all  Social Connections: Moderately Isolated   Frequency of Communication with Friends and Family: More than three times a week   Frequency of Social Gatherings with Friends and Family: Three times a week   Attends Religious Services: Never   Active Member of Clubs or Organizations: No   Attends Archivist Meetings: Never   Marital Status: Married  Human resources officer Violence: Not At Risk   Fear of Current or Ex-Partner: No   Emotionally Abused: No   Physically Abused: No   Sexually Abused: No    Review of Systems: See HPI, otherwise negative ROS  Physical Exam: BP 109/73   Pulse 77   Temp (!) 96 F (35.6 C) (Temporal)   Resp 16   Ht $R'5\' 8"'pM$  (1.727 m)   Wt 90.7 kg   SpO2 100%   BMI 30.41 kg/m  General:   Alert,  pleasant and cooperative in NAD Head:  Normocephalic and atraumatic. Neck:  Supple; no masses or thyromegaly. Lungs:  Clear throughout to auscultation.    Heart:  Regular rate and rhythm. Abdomen:  Soft, nontender and nondistended. Normal bowel sounds, without guarding, and without rebound.   Neurologic:  Alert and  oriented x4;  grossly normal neurologically.  Impression/Plan: [name] is here for an endoscopy to be performed for chronic GERD, sore throat, heart burn  Risks, benefits, limitations, and alternatives regarding  endoscopy have been reviewed with the patient.  Questions have been answered.  All parties agreeable.   Sherri Sear, MD  03/25/2022, 8:22 AM

## 2022-03-25 NOTE — Anesthesia Procedure Notes (Signed)
Date/Time: 03/25/2022 8:29 AM Performed by: Doreen Salvage, CRNA Pre-anesthesia Checklist: Patient identified, Emergency Drugs available, Suction available and Patient being monitored Patient Re-evaluated:Patient Re-evaluated prior to induction Oxygen Delivery Method: Nasal cannula Induction Type: IV induction Dental Injury: Teeth and Oropharynx as per pre-operative assessment  Comments: Nasal cannula with etCO2 monitoring

## 2022-03-25 NOTE — Transfer of Care (Signed)
Immediate Anesthesia Transfer of Care Note  Patient: [name]  Procedure(s) Performed: Procedure(s): ESOPHAGOGASTRODUODENOSCOPY (EGD) WITH PROPOFOL (N/A)  Patient Location: PACU and Endoscopy Unit  Anesthesia Type:General  Level of Consciousness: sedated  Airway & Oxygen Therapy: Patient Spontanous Breathing and Patient connected to nasal cannula oxygen  Post-op Assessment: Report given to RN and Post -op Vital signs reviewed and stable  Post vital signs: Reviewed and stable  Last Vitals:  Vitals:   03/25/22 0742 03/25/22 0840  BP: 109/73 94/62  Pulse: 77 78  Resp: 16 17  Temp: (!) 35.6 C   SpO2: 615% 37%    Complications: No apparent anesthesia complications

## 2022-03-25 NOTE — Anesthesia Postprocedure Evaluation (Signed)
Anesthesia Post Note  Patient: ONDA KATTNER  Procedure(s) Performed: ESOPHAGOGASTRODUODENOSCOPY (EGD) WITH PROPOFOL  Patient location during evaluation: PACU Anesthesia Type: General Level of consciousness: awake and alert, oriented and patient cooperative Pain management: pain level controlled Vital Signs Assessment: post-procedure vital signs reviewed and stable Respiratory status: spontaneous breathing, nonlabored ventilation and respiratory function stable Cardiovascular status: blood pressure returned to baseline and stable Postop Assessment: adequate PO intake Anesthetic complications: no   No notable events documented.   Last Vitals:  Vitals:   03/25/22 0850 03/25/22 0900  BP: 94/62 117/69  Pulse: 74 71  Resp: 15 20  Temp:    SpO2: 99% 100%    Last Pain:  Vitals:   03/25/22 0900  TempSrc:   PainSc: 0-No pain                 Darrin Nipper

## 2022-03-25 NOTE — Op Note (Signed)
[facility] Gastroenterology Patient Name: [name] Procedure Date: 03/25/2022 8:21 AM MRN: [id number] Account #: [id number] Date of Birth: 06-Jul-1961 Admit Type: Outpatient Age: 61 Room: [facility] ENDO ROOM 4 Gender: Female Note Status: Finalized Instrument Name: Upper Endoscope [id number] Procedure:             Upper GI endoscopy Indications:           Heartburn, Follow-up of gastro-esophageal reflux                         disease Providers:             Lin Landsman MD, MD Referring MD:          Bethena Roys. Sowles, MD (Referring MD) Medicines:             General Anesthesia Complications:         No immediate complications. Estimated blood loss: None. Procedure:             Pre-Anesthesia Assessment:                        - Prior to the procedure, a History and Physical was                         performed, and patient medications and allergies were                         reviewed. The patient is competent. The risks and                         benefits of the procedure and the sedation options and                         risks were discussed with the patient. All questions                         were answered and informed consent was obtained.                         Patient identification and proposed procedure were                         verified by the physician, the nurse, the                         anesthesiologist, the anesthetist and the technician                         in the pre-procedure area in the procedure room in the                         endoscopy suite. Mental Status Examination: alert and                         oriented. Airway Examination: normal oropharyngeal                         airway and neck mobility. Respiratory Examination:  clear to auscultation. CV Examination: normal.                         Prophylactic Antibiotics: The patient does not require                         prophylactic antibiotics.  Prior Anticoagulants: The                         patient has taken no previous anticoagulant or                         antiplatelet agents. ASA Grade Assessment: III - A                         patient with severe systemic disease. After reviewing                         the risks and benefits, the patient was deemed in                         satisfactory condition to undergo the procedure. The                         anesthesia plan was to use general anesthesia.                         Immediately prior to administration of medications,                         the patient was re-assessed for adequacy to receive                         sedatives. The heart rate, respiratory rate, oxygen                         saturations, blood pressure, adequacy of pulmonary                         ventilation, and response to care were monitored                         throughout the procedure. The physical status of the                         patient was re-assessed after the procedure.                        After obtaining informed consent, the endoscope was                         passed under direct vision. Throughout the procedure,                         the patient's blood pressure, pulse, and oxygen                         saturations were monitored continuously. The Endoscope  was introduced through the mouth, and advanced to the                         second part of duodenum. The upper GI endoscopy was                         accomplished without difficulty. The patient tolerated                         the procedure well. Findings:      The duodenal bulb and second portion of the duodenum were normal.      A large amount of food (residue) was found in the entire examined       stomach.      Esophagogastric landmarks were identified: the gastroesophageal junction       was found at 44 cm from the incisors.      The gastroesophageal junction and examined esophagus  were normal.       Biopsies were taken with a cold forceps for histology. Impression:            - Normal duodenal bulb and second portion of the                         duodenum.                        - A large amount of food (residue) in the stomach.                        - Esophagogastric landmarks identified.                        - Normal gastroesophageal junction and esophagus.                         Biopsied. Recommendation:        - Await pathology results.                        - Discharge patient to home (with escort).                        - Diabetic (ADA) diet today.                        - Continue present medications.                        - Do a gastric emptying study at appointment to be                         scheduled. Procedure Code(s):     --- Professional ---                        [phone], Esophagogastroduodenoscopy, flexible,                         transoral; with biopsy, single or multiple Diagnosis Code(s):     --- Professional ---  R12, Heartburn                        K21.9, Gastro-esophageal reflux disease without                         esophagitis CPT copyright 2019 American Medical Association. All rights reserved. The codes documented in this report are preliminary and upon coder review may  be revised to meet current compliance requirements. Dr. Ulyess Mort Lin Landsman MD, MD 03/25/2022 8:38:57 AM This report has been signed electronically. Number of Addenda: 0 Note Initiated On: 03/25/2022 8:21 AM Estimated Blood Loss:  Estimated blood loss: none.      [facility]

## 2022-03-25 NOTE — Anesthesia Preprocedure Evaluation (Addendum)
Anesthesia Evaluation  Patient identified by MRN, date of birth, ID band Patient awake    Reviewed: Allergy & Precautions, NPO status , Patient's Chart, lab work & pertinent test results  History of Anesthesia Complications Negative for: history of anesthetic complications  Airway Mallampati: IV   Neck ROM: Full    Dental no notable dental hx.    Pulmonary COPD, former smoker (quit 2020),    Pulmonary exam normal breath sounds clear to auscultation       Cardiovascular hypertension, Normal cardiovascular exam Rhythm:Regular Rate:Normal  ECG 03/23/22: NSR  Echocardiogram 02/19/2022 EF 60 to 12%, grade 2 diastolic dysfunction   Neuro/Psych PSYCHIATRIC DISORDERS Depression TIA (02/18/22)   GI/Hepatic PUD, GERD  ,  Endo/Other  Obesity   Renal/GU negative Renal ROS     Musculoskeletal  (+) Arthritis ,   Abdominal   Peds  Hematology negative hematology ROS (+)   Anesthesia Other Findings Cardiology note 03/23/22:  1. HFpEF, grade 2 diastolic dysfunction, orthopnea, edema.  Start Lasix 20 mg daily, monitor BP at home.  If BP runs low, low threshold to stop Norvasc.  Check BMP in 1 week 2. Hypertension, continue losartan, HCTZ, Norvasc 5 mg daily.  Lasix as above. 3. History of TIA, place cardiac monitor to evaluate presence of A-fib or flutter.  Continue aspirin, Lipitor.  Follow-up with primary care physician regarding neurology referral.  Follow-up after cardiac monitor.  Reproductive/Obstetrics                            Anesthesia Physical Anesthesia Plan  ASA: 3  Anesthesia Plan: General   Post-op Pain Management:    Induction: Intravenous  PONV Risk Score and Plan: 3 and Propofol infusion, TIVA and Treatment may vary due to age or medical condition  Airway Management Planned: Natural Airway  Additional Equipment:   Intra-op Plan:   Post-operative Plan:   Informed Consent: I  have reviewed the patients History and Physical, chart, labs and discussed the procedure including the risks, benefits and alternatives for the proposed anesthesia with the patient or authorized representative who has indicated his/her understanding and acceptance.       Plan Discussed with: CRNA  Anesthesia Plan Comments: (LMA/GETA backup discussed.  Patient consented for risks of anesthesia including but not limited to:  - adverse reactions to medications - damage to eyes, teeth, lips or other oral mucosa - nerve damage due to positioning  - sore throat or hoarseness - damage to heart, brain, nerves, lungs, other parts of body or loss of life  Informed patient about role of CRNA in peri- and intra-operative care.  Patient voiced understanding.)        Anesthesia Quick Evaluation

## 2022-03-26 LAB — SURGICAL PATHOLOGY

## 2022-03-26 NOTE — Telephone Encounter (Signed)
Pt is in need of a referral to neurologist per her cardiologist. Pt states if she needs an appt for this, can it be virtual. She understands Dr Ancil Boozer is out of the office

## 2022-03-27 ENCOUNTER — Ambulatory Visit: Payer: Medicare Other | Admitting: Certified Registered"

## 2022-03-27 ENCOUNTER — Telehealth: Payer: Self-pay

## 2022-03-27 ENCOUNTER — Ambulatory Visit
Admission: RE | Admit: 2022-03-27 | Discharge: 2022-03-27 | Disposition: A | Discharge status: Home / Self Care | Payer: Medicare Other | Attending: Gastroenterology | Admitting: Gastroenterology

## 2022-03-27 ENCOUNTER — Encounter
Admission: RE | Disposition: A | Discharge status: Home / Self Care | Payer: Self-pay | Source: Home / Self Care | Attending: Gastroenterology

## 2022-03-27 DIAGNOSIS — R1013 Epigastric pain: Secondary | ICD-10-CM | POA: Diagnosis not present

## 2022-03-27 DIAGNOSIS — G459 Transient cerebral ischemic attack, unspecified: Secondary | ICD-10-CM | POA: Diagnosis not present

## 2022-03-27 DIAGNOSIS — K219 Gastro-esophageal reflux disease without esophagitis: Secondary | ICD-10-CM | POA: Insufficient documentation

## 2022-03-27 DIAGNOSIS — Z6829 Body mass index (BMI) 29.0-29.9, adult: Secondary | ICD-10-CM | POA: Insufficient documentation

## 2022-03-27 DIAGNOSIS — K449 Diaphragmatic hernia without obstruction or gangrene: Secondary | ICD-10-CM | POA: Insufficient documentation

## 2022-03-27 DIAGNOSIS — I110 Hypertensive heart disease with heart failure: Secondary | ICD-10-CM | POA: Diagnosis not present

## 2022-03-27 DIAGNOSIS — K319 Disease of stomach and duodenum, unspecified: Secondary | ICD-10-CM | POA: Insufficient documentation

## 2022-03-27 DIAGNOSIS — J449 Chronic obstructive pulmonary disease, unspecified: Secondary | ICD-10-CM | POA: Insufficient documentation

## 2022-03-27 DIAGNOSIS — E669 Obesity, unspecified: Secondary | ICD-10-CM | POA: Insufficient documentation

## 2022-03-27 DIAGNOSIS — Z8673 Personal history of transient ischemic attack (TIA), and cerebral infarction without residual deficits: Secondary | ICD-10-CM | POA: Diagnosis not present

## 2022-03-27 DIAGNOSIS — M1990 Unspecified osteoarthritis, unspecified site: Secondary | ICD-10-CM | POA: Diagnosis not present

## 2022-03-27 DIAGNOSIS — I5030 Unspecified diastolic (congestive) heart failure: Secondary | ICD-10-CM | POA: Insufficient documentation

## 2022-03-27 DIAGNOSIS — F32A Depression, unspecified: Secondary | ICD-10-CM | POA: Insufficient documentation

## 2022-03-27 DIAGNOSIS — Z87891 Personal history of nicotine dependence: Secondary | ICD-10-CM | POA: Diagnosis not present

## 2022-03-27 DIAGNOSIS — K3189 Other diseases of stomach and duodenum: Secondary | ICD-10-CM | POA: Insufficient documentation

## 2022-03-27 DIAGNOSIS — K259 Gastric ulcer, unspecified as acute or chronic, without hemorrhage or perforation: Secondary | ICD-10-CM

## 2022-03-27 DIAGNOSIS — K2960 Other gastritis without bleeding: Secondary | ICD-10-CM | POA: Insufficient documentation

## 2022-03-27 HISTORY — PX: ESOPHAGOGASTRODUODENOSCOPY (EGD) WITH PROPOFOL: SHX5813

## 2022-03-27 SURGERY — ESOPHAGOGASTRODUODENOSCOPY (EGD) WITH PROPOFOL
Anesthesia: General

## 2022-03-27 MED ORDER — GLYCOPYRROLATE 0.2 MG/ML IJ SOLN
INTRAMUSCULAR | Status: DC | PRN
Start: 2022-03-27 — End: 2022-03-27
  Administered 2022-03-27: .2 mg via INTRAVENOUS

## 2022-03-27 MED ORDER — PROPOFOL 500 MG/50ML IV EMUL
INTRAVENOUS | Status: DC | PRN
  Administered 2022-03-27: 120 ug/kg/min via INTRAVENOUS

## 2022-03-27 MED ORDER — PROPOFOL 10 MG/ML IV BOLUS
INTRAVENOUS | Status: DC | PRN
Start: 2022-03-27 — End: 2022-03-27
  Administered 2022-03-27: 30 mg via INTRAVENOUS
  Administered 2022-03-27: 70 mg via INTRAVENOUS

## 2022-03-27 MED ORDER — SODIUM CHLORIDE 0.9 % IV SOLN: INTRAVENOUS | Status: DC

## 2022-03-27 MED ORDER — PANTOPRAZOLE SODIUM 40 MG PO TBEC: 40.0000 mg | DELAYED_RELEASE_TABLET | Freq: Two times a day (BID) | ORAL | 3 refills | Status: DC

## 2022-03-27 MED ORDER — LIDOCAINE 2% (20 MG/ML) 5 ML SYRINGE
INTRAMUSCULAR | Status: DC | PRN
  Administered 2022-03-27: 20 mg via INTRAVENOUS

## 2022-03-27 NOTE — Telephone Encounter (Signed)
-----   Message from Lin Landsman, MD sent at 03/26/2022 11:58 PM EDT ----- Please inform patient that the pathology results from recent upper endoscopy confirmed that she has reflux.  She should continue taking Protonix 40 mg twice daily.  If this is not helping, we can send in a stronger medication Prilosec or Nexium 40 mg p.o. twice daily for 3 months  RV

## 2022-03-27 NOTE — H&P (Signed)
Cephas Darby, MD [address]  [place]  [place], [place] [address]  Main: [phone]  Fax: [phone] Pager: [phone]  Primary Care Physician:  Steele Sizer, MD Primary Gastroenterologist:  Dr. Cephas Darby  Pre-Procedure History & Physical: HPI:  [name] is a 61 y.o. female is here for an endoscopy.   Past Medical History:  Diagnosis Date   Abdominal aortic atherosclerosis (HCC)    Anemia    Arthritis    bilateral knees   Bronchitis    Carpal tunnel syndrome, bilateral    Contact dermatitis and other eczema, due to unspecified cause    COPD (chronic obstructive pulmonary disease) (HCC)    DDD (degenerative disc disease)    Depressive disorder    Diverticulosis    Dysmetabolic syndrome X    Eczema    GERD (gastroesophageal reflux disease)    Hyperlipidemia    Hypertension    IBS (irritable bowel syndrome)    Insomnia    Lumbago    Nonspecific abnormal electrocardiogram (ECG) (EKG)    Osteoarthritis of both knees    Other ovarian failure(256.39)    Peptic ulcer    Postmenopausal atrophic vaginitis    Symptomatic menopausal or female climacteric states    Unspecified vitamin D deficiency     Past Surgical History:  Procedure Laterality Date   ACHILLES TENDON SURGERY Left 01/02/2022   Procedure: ACHILLES TENDON REPAIR-SECONDARY;  Surgeon: Samara Deist, DPM;  Location: [facility];  Service: Podiatry;  Laterality: Left;   BACK SURGERY     BREAST EXCISIONAL BIOPSY Left ?   neg   BREAST LUMPECTOMY Left 01/08/2021   Procedure: BREAST LUMPECTOMY;  Surgeon: Ronny Bacon, MD;  Location: [facility];  Service: General;  Laterality: Left;   CARPAL TUNNEL RELEASE Left 08/15/2018   Procedure: CARPAL TUNNEL RELEASE;  Surgeon: Earnestine Leys, MD;  Location: [facility];  Service: Orthopedics;  Laterality: Left;   CERVICAL DISCECTOMY     x 2; metal plate   CHOLECYSTECTOMY     COLONOSCOPY WITH PROPOFOL N/A 08/08/2018   Procedure: COLONOSCOPY WITH  PROPOFOL;  Surgeon: Virgel Manifold, MD;  Location: [facility];  Service: Endoscopy;  Laterality: N/A;   COLONOSCOPY WITH PROPOFOL N/A 12/01/2021   Procedure: COLONOSCOPY WITH PROPOFOL;  Surgeon: Lin Landsman, MD;  Location: [facility] ENDOSCOPY;  Service: Gastroenterology;  Laterality: N/A;   DILATATION & CURETTAGE/HYSTEROSCOPY WITH MYOSURE N/A 03/21/2015   Procedure: DILATATION & CURETTAGE/HYSTEROSCOPY;  Surgeon: Aletha Halim, MD;  Location: [facility];  Service: Gynecology;  Laterality: N/A;   DILATION AND CURETTAGE OF UTERUS     ENDOMETRIAL ABLATION     ESOPHAGOGASTRODUODENOSCOPY (EGD) WITH PROPOFOL N/A 04/13/2018   Procedure: ESOPHAGOGASTRODUODENOSCOPY (EGD) WITH PROPOFOL;  Surgeon: Virgel Manifold, MD;  Location: [facility];  Service: Endoscopy;  Laterality: N/A;   ESOPHAGOGASTRODUODENOSCOPY (EGD) WITH PROPOFOL N/A 08/08/2018   Procedure: ESOPHAGOGASTRODUODENOSCOPY (EGD) WITH PROPOFOL;  Surgeon: Virgel Manifold, MD;  Location: [facility];  Service: Endoscopy;  Laterality: N/A;   ESOPHAGOGASTRODUODENOSCOPY (EGD) WITH PROPOFOL N/A 03/25/2022   Procedure: ESOPHAGOGASTRODUODENOSCOPY (EGD) WITH PROPOFOL;  Surgeon: Lin Landsman, MD;  Location: [facility];  Service: Gastroenterology;  Laterality: N/A;   JOINT REPLACEMENT     OSTECTOMY Left 01/02/2022   Procedure: CALCANEAL EXOSTECTOMY;  Surgeon: Samara Deist, DPM;  Location: [facility];  Service: Podiatry;  Laterality: Left;   SPINAL FUSION     lumbar x2   TONSILLECTOMY     TOTAL KNEE ARTHROPLASTY Left 04/12/2019   Procedure: TOTAL  KNEE ARTHROPLASTY;  Surgeon: Lovell Sheehan, MD;  Location: [facility];  Service: Orthopedics;  Laterality: Left;   TOTAL KNEE ARTHROPLASTY Right 04/17/2020   Procedure: TOTAL KNEE ARTHROPLASTY;  Surgeon: Lovell Sheehan, MD;  Location: [facility];  Service: Orthopedics;  Laterality: Right;   TUBAL LIGATION      Prior to Admission medications   Medication Sig Start Date End Date  Taking? Authorizing Provider  amLODipine (NORVASC) 5 MG tablet Take 1 tablet (5 mg total) by mouth daily. 03/19/22  Yes Sowles, Drue Stager, MD  losartan-hydrochlorothiazide (HYZAAR) 100-25 MG tablet Take 1 tablet by mouth daily. 03/19/22  Yes Sowles, Drue Stager, MD  amitriptyline (ELAVIL) 10 MG tablet Take 1 tablet (10 mg total) by mouth at bedtime. 03/19/22   Steele Sizer, MD  aspirin EC 81 MG EC tablet Take 1 tablet (81 mg total) by mouth daily. Swallow whole. 02/21/22   Annita Brod, MD  atorvastatin (LIPITOR) 40 MG tablet Take 1 tablet (40 mg total) by mouth at bedtime. 03/02/22   Delsa Grana, PA-C  cholecalciferol (VITAMIN D) 1000 units tablet Take 1,000 Units by mouth daily.    [provider]  Cyanocobalamin (B-12) 1000 MCG SUBL Place 1 each under the tongue 3 (three) times a week.    [provider]  desvenlafaxine (PRISTIQ) 50 MG 24 hr tablet Take 1 tablet (50 mg total) by mouth daily. 03/19/22   Steele Sizer, MD  famotidine (PEPCID) 20 MG tablet TAKE 1 TABLET(20 MG) BY MOUTH TWICE DAILY AS NEEDED FOR HEARTBURN OR INDIGESTION 03/05/22   Delsa Grana, PA-C  furosemide (LASIX) 40 MG tablet Take 0.5 tablets (20 mg total) by mouth daily. 03/23/22 06/22/23  Kate Sable, MD  gabapentin (NEURONTIN) 300 MG capsule Take 300 mg by mouth every 6 (six) hours as needed (Nerve pain).    Shanon Ace, MD  metFORMIN (GLUCOPHAGE) 500 MG tablet Take 1 tablet (500 mg total) by mouth daily with breakfast. 06/04/21   Steele Sizer, MD  Multiple Vitamins-Minerals (MULTIVITAMIN WITH MINERALS) tablet Take 1 tablet by mouth every other day.    [provider]  NARCAN 4 MG/0.1ML LIQD nasal spray kit Place 1 spray into the nose once. 03/14/19   Shanon Ace, MD  Oxycodone HCl 20 MG TABS Take 20 mg by mouth every 4 (four) hours as needed (pain). 07/16/20   [facility]  pantoprazole (PROTONIX) 40 MG tablet Take 1 tablet (40 mg total) by mouth daily. 03/02/22    Delsa Grana, PA-C  QUEtiapine (SEROQUEL) 25 MG tablet Take 1 tablet (25 mg total) by mouth at bedtime. 02/05/22   Delsa Grana, PA-C  tiZANidine (ZANAFLEX) 4 MG tablet Take 4 mg by mouth every 8 (eight) hours. 02/13/22   [provider]  valACYclovir (VALTREX) 1000 MG tablet Take 1,000 mg by mouth 2 (two) times daily as needed.    [provider]    Allergies as of 03/25/2022   (No Known Allergies)    Family History  Problem Relation Age of Onset   Heart disease Mother    Thyroid cancer Mother    Congestive Heart Failure Mother    Hypertension Father    Heart failure Maternal Uncle    Breast cancer Maternal Grandmother    Heart failure Maternal Grandfather    Depression Daughter    Asthma Daughter     Social History   Socioeconomic History   Marital status: Married    Spouse name: Mikeal Hawthorne   Number of children: 4  Years of education: Not on file   Highest education level: 12th grade  Occupational History   Occupation: disabled    Comment: chronic back pain   Tobacco Use   Smoking status: Former    Packs/day: 1.00    Years: 20.00    Pack years: 20.00    Types: Cigarettes    Start date: 07/26/2002    Quit date: 04/06/2019    Years since quitting: 2.9   Smokeless tobacco: Never   Tobacco comments:    Quit Date 04/06/2019  Vaping Use   Vaping Use: Never used  Substance and Sexual Activity   Alcohol use: Yes    Comment: occasional beer   Drug use: No   Sexual activity: Yes    Partners: Male    Birth control/protection: Other-see comments    Comment: Ablation  Other Topics Concern   Not on file  Social History Narrative   Not on file   Social Determinants of Health   Financial Resource Strain: Low Risk    Difficulty of Paying Living Expenses: Not hard at all  Food Insecurity: No Food Insecurity   Worried About Charity fundraiser in the Last Year: Never true   [place] in the Last Year: Never true  Transportation Needs: No  Transportation Needs   Lack of Transportation (Medical): No   Lack of Transportation (Non-Medical): No  Physical Activity: Inactive   Days of Exercise per Week: 0 days   Minutes of Exercise per Session: 0 min  Stress: No Stress Concern Present   Feeling of Stress : Not at all  Social Connections: Moderately Isolated   Frequency of Communication with Friends and Family: More than three times a week   Frequency of Social Gatherings with Friends and Family: Three times a week   Attends Religious Services: Never   Active Member of Clubs or Organizations: No   Attends Archivist Meetings: Never   Marital Status: Married  Human resources officer Violence: Not At Risk   Fear of Current or Ex-Partner: No   Emotionally Abused: No   Physically Abused: No   Sexually Abused: No    Review of Systems: See HPI, otherwise negative ROS  Physical Exam: BP (!) 146/96   Pulse (!) 102   Temp (!) 96.1 F (35.6 C) (Temporal)   Resp 20   Ht $R'5\' 8"'Kf$  (1.727 m)   Wt 88.5 kg   SpO2 99%   BMI 29.65 kg/m  General:   Alert,  pleasant and cooperative in NAD Head:  Normocephalic and atraumatic. Neck:  Supple; no masses or thyromegaly. Lungs:  Clear throughout to auscultation.    Heart:  Regular rate and rhythm. Abdomen:  Soft, nontender and nondistended. Normal bowel sounds, without guarding, and without rebound.   Neurologic:  Alert and  oriented x4;  grossly normal neurologically.  Impression/Plan: [name] is here for an endoscopy to be performed for incomplete endoscopy due to food in stomach  Risks, benefits, limitations, and alternatives regarding  endoscopy have been reviewed with the patient.  Questions have been answered.  All parties agreeable.   Sherri Sear, MD  03/27/2022, 8:56 AM

## 2022-03-27 NOTE — Transfer of Care (Signed)
Immediate Anesthesia Transfer of Care Note  Patient: [name]  Procedure(s) Performed: ESOPHAGOGASTRODUODENOSCOPY (EGD) WITH PROPOFOL  Patient Location: Endoscopy Unit  Anesthesia Type:General  Level of Consciousness: awake and alert   Airway & Oxygen Therapy: Patient Spontanous Breathing  Post-op Assessment: Report given to RN and Post -op Vital signs reviewed and stable  Post vital signs: Reviewed  Last Vitals:  Vitals Value Taken Time  BP    Temp    Pulse 97 03/27/22 0915  Resp 22 03/27/22 0915  SpO2 97 % 03/27/22 0915  Vitals shown include unvalidated device data.  Last Pain:  Vitals:   03/27/22 0806  TempSrc: Temporal  PainSc: 0-No pain         Complications: No notable events documented.

## 2022-03-27 NOTE — Anesthesia Preprocedure Evaluation (Signed)
Anesthesia Evaluation  Patient identified by MRN, date of birth, ID band Patient awake    Reviewed: Allergy & Precautions, NPO status , Patient's Chart, lab work & pertinent test results  History of Anesthesia Complications Negative for: history of anesthetic complications  Airway Mallampati: IV   Neck ROM: Full    Dental no notable dental hx.    Pulmonary COPD, former smoker,    Pulmonary exam normal breath sounds clear to auscultation       Cardiovascular hypertension, Normal cardiovascular exam Rhythm:Regular Rate:Normal  ECG 03/23/22: NSR  Echocardiogram 02/19/2022 EF 60 to 40%, grade 2 diastolic dysfunction   Neuro/Psych PSYCHIATRIC DISORDERS Depression TIA (02/18/22)   GI/Hepatic PUD, GERD  ,  Endo/Other  Obesity   Renal/GU negative Renal ROS     Musculoskeletal  (+) Arthritis ,   Abdominal   Peds  Hematology negative hematology ROS (+)   Anesthesia Other Findings Cardiology note 03/23/22:  1. HFpEF, grade 2 diastolic dysfunction, orthopnea, edema.  Start Lasix 20 mg daily, monitor BP at home.  If BP runs low, low threshold to stop Norvasc.  Check BMP in 1 week 2. Hypertension, continue losartan, HCTZ, Norvasc 5 mg daily.  Lasix as above. 3. History of TIA, place cardiac monitor to evaluate presence of A-fib or flutter.  Continue aspirin, Lipitor.  Follow-up with primary care physician regarding neurology referral.  Follow-up after cardiac monitor.  Reproductive/Obstetrics                             Anesthesia Physical  Anesthesia Plan  ASA: 3  Anesthesia Plan: General   Post-op Pain Management:    Induction: Intravenous  PONV Risk Score and Plan: 3 and Propofol infusion, TIVA and Treatment may vary due to age or medical condition  Airway Management Planned: Natural Airway  Additional Equipment:   Intra-op Plan:   Post-operative Plan:   Informed Consent: I have  reviewed the patients History and Physical, chart, labs and discussed the procedure including the risks, benefits and alternatives for the proposed anesthesia with the patient or authorized representative who has indicated his/her understanding and acceptance.       Plan Discussed with: CRNA  Anesthesia Plan Comments: (LMA/GETA backup discussed.  Patient consented for risks of anesthesia including but not limited to:  - adverse reactions to medications - damage to eyes, teeth, lips or other oral mucosa - nerve damage due to positioning  - sore throat or hoarseness - damage to heart, brain, nerves, lungs, other parts of body or loss of life  Informed patient about role of CRNA in peri- and intra-operative care.  Patient voiced understanding.)        Anesthesia Quick Evaluation

## 2022-03-27 NOTE — Op Note (Signed)
[facility] Patient Name: [name] Procedure Date: 03/27/2022 8:55 AM MRN: [id number] Account #: [id number] Date of Birth: 1961-04-14 Admit Type: Outpatient Age: 61 Room: [facility] ENDO ROOM 2 Gender: Female Note Status: Finalized Instrument Name: Altamese Cabal Endoscope [id number] Procedure:             Upper GI endoscopy Indications:           Epigastric abdominal pain Providers:             Lin Landsman MD, MD Medicines:             General Anesthesia Complications:         No immediate complications. Estimated blood loss: None. Procedure:             Pre-Anesthesia Assessment:                        - Prior to the procedure, a History and Physical was                         performed, and patient medications and allergies were                         reviewed. The patient is competent. The risks and                         benefits of the procedure and the sedation options and                         risks were discussed with the patient. All questions                         were answered and informed consent was obtained.                         Patient identification and proposed procedure were                         verified by the physician, the nurse, the                         anesthesiologist, the anesthetist and the technician                         in the pre-procedure area in the procedure room in the                         endoscopy suite. Mental Status Examination: alert and                         oriented. Airway Examination: normal oropharyngeal                         airway and neck mobility. Respiratory Examination:                         clear to auscultation. CV Examination: normal.                         Prophylactic Antibiotics:  The patient does not require                         prophylactic antibiotics. Prior Anticoagulants: The                         patient has taken no previous anticoagulant or                          antiplatelet agents. ASA Grade Assessment: III - A                         patient with severe systemic disease. After reviewing                         the risks and benefits, the patient was deemed in                         satisfactory condition to undergo the procedure. The                         anesthesia plan was to use general anesthesia.                         Immediately prior to administration of medications,                         the patient was re-assessed for adequacy to receive                         sedatives. The heart rate, respiratory rate, oxygen                         saturations, blood pressure, adequacy of pulmonary                         ventilation, and response to care were monitored                         throughout the procedure. The physical status of the                         patient was re-assessed after the procedure.                        After obtaining informed consent, the endoscope was                         passed under direct vision. Throughout the procedure,                         the patient's blood pressure, pulse, and oxygen                         saturations were monitored continuously. The Endoscope                         was introduced through the mouth, and advanced to the  second part of duodenum. The upper GI endoscopy was                         accomplished without difficulty. The patient tolerated                         the procedure well. Findings:      The duodenal bulb and second portion of the duodenum were normal.      A single localized 3 mm erosion with no bleeding and no stigmata of       recent bleeding was found at the incisura.      Diffuse mildly erythematous mucosa without bleeding was found in the       gastric body and in the gastric antrum. Biopsies were taken with a cold       forceps for Helicobacter pylori testing.      A small amount of food (residue) was found in the  gastric fundus and in       the gastric body.      The cardia and gastric fundus were normal on retroflexion.      The gastroesophageal junction and examined esophagus were normal. Impression:            - Normal duodenal bulb and second portion of the                         duodenum.                        - Erosive gastropathy with no bleeding and no stigmata                         of recent bleeding.                        - Erythematous mucosa in the gastric body and antrum.                         Biopsied.                        - A small amount of food (residue) in the stomach.                        - Normal gastroesophageal junction and esophagus. Recommendation:        - Discharge patient to home (with escort).                        - Resume previous diet today.                        - Continue present medications.                        - Await pathology results. Procedure Code(s):     --- Professional ---                        [phone], Esophagogastroduodenoscopy, flexible,                         transoral; with biopsy, single or  multiple Diagnosis Code(s):     --- Professional ---                        K31.89, Other diseases of stomach and duodenum                        R10.13, Epigastric pain CPT copyright 2019 American Medical Association. All rights reserved. The codes documented in this report are preliminary and upon coder review may  be revised to meet current compliance requirements. Dr. Ulyess Mort Lin Landsman MD, MD 03/27/2022 9:13:19 AM This report has been signed electronically. Number of Addenda: 0 Note Initiated On: 03/27/2022 8:55 AM Estimated Blood Loss:  Estimated blood loss: none.      [facility]

## 2022-03-27 NOTE — Telephone Encounter (Signed)
Called and left a message for call back  

## 2022-03-27 NOTE — Anesthesia Postprocedure Evaluation (Signed)
Anesthesia Post Note  Patient: [name]  Procedure(s) Performed: ESOPHAGOGASTRODUODENOSCOPY (EGD) WITH PROPOFOL  Patient location during evaluation: PACU Anesthesia Type: General Level of consciousness: awake and alert, oriented and patient cooperative Pain management: pain level controlled Vital Signs Assessment: post-procedure vital signs reviewed and stable Respiratory status: spontaneous breathing, nonlabored ventilation and respiratory function stable Cardiovascular status: blood pressure returned to baseline and stable Postop Assessment: adequate PO intake Anesthetic complications: no   No notable events documented.   Last Vitals:  Vitals:   03/27/22 0925 03/27/22 0935  BP: 134/76 127/77  Pulse: 94 90  Resp: (!) 22 19  Temp:    SpO2: 98% 99%    Last Pain:  Vitals:   03/27/22 0935  TempSrc:   PainSc: 0-No pain                 Darrin Nipper

## 2022-03-31 ENCOUNTER — Encounter: Payer: Self-pay | Admitting: Gastroenterology

## 2022-03-31 LAB — SURGICAL PATHOLOGY

## 2022-03-31 NOTE — Telephone Encounter (Signed)
Patient verbalized understanding  

## 2022-04-01 ENCOUNTER — Encounter: Payer: Self-pay | Admitting: Gastroenterology

## 2022-04-01 ENCOUNTER — Ambulatory Visit: Payer: Medicare Other

## 2022-04-01 DIAGNOSIS — I10 Essential (primary) hypertension: Secondary | ICD-10-CM

## 2022-04-01 DIAGNOSIS — J449 Chronic obstructive pulmonary disease, unspecified: Secondary | ICD-10-CM

## 2022-04-01 DIAGNOSIS — I5030 Unspecified diastolic (congestive) heart failure: Secondary | ICD-10-CM

## 2022-04-01 NOTE — Chronic Care Management (AMB) (Signed)
Care Management    RN Visit Note  04/01/2022 Name: [name] MRN: [id number] DOB: 01/31/1961  Subjective: [name] is a 61 y.o. year old female who is a primary care patient of Olivia Sizer, MD. The care management team was consulted for assistance with disease management and care coordination needs.    Engaged with patient by telephone for initial visit in response to provider referral for case management and care coordination services.   Consent to Services:   [name] was given information about Care Management services including:  Care Management services includes personalized support from designated clinical staff supervised by her physician, including individualized plan of care and coordination with other care providers 24/7 contact phone numbers for assistance for urgent and routine care needs. The patient may stop case management services at any time by phone call to the office staff.  Patient agreed to services and consent obtained.   Assessment: Review of patient past medical history, allergies, medications, health status, including review of consultants reports, laboratory and other test data, was performed as part of comprehensive evaluation and provision of chronic care management services.   SDOH (Social Determinants of Health) assessments and interventions performed:   SDOH Interventions    Flowsheet Row Most Recent Value  SDOH Interventions   Food Insecurity Interventions Intervention Not Indicated  Transportation Interventions Intervention Not Indicated        Care Plan  No Known Allergies  Outpatient Encounter Medications as of 04/01/2022  Medication Sig   amitriptyline (ELAVIL) 10 MG tablet Take 1 tablet (10 mg total) by mouth at bedtime.   amLODipine (NORVASC) 5 MG tablet Take 1 tablet (5 mg total) by mouth daily.   aspirin EC 81 MG EC tablet Take 1 tablet (81 mg total) by mouth daily. Swallow whole.   atorvastatin (LIPITOR) 40 MG tablet  Take 1 tablet (40 mg total) by mouth at bedtime.   cholecalciferol (VITAMIN D) 1000 units tablet Take 1,000 Units by mouth daily.   Cyanocobalamin (B-12) 1000 MCG SUBL Place 1 each under the tongue 3 (three) times a week.   desvenlafaxine (PRISTIQ) 50 MG 24 hr tablet Take 1 tablet (50 mg total) by mouth daily.   famotidine (PEPCID) 20 MG tablet TAKE 1 TABLET(20 MG) BY MOUTH TWICE DAILY AS NEEDED FOR HEARTBURN OR INDIGESTION   furosemide (LASIX) 40 MG tablet Take 0.5 tablets (20 mg total) by mouth daily.   gabapentin (NEURONTIN) 300 MG capsule Take 300 mg by mouth every 6 (six) hours as needed (Nerve pain).   losartan-hydrochlorothiazide (HYZAAR) 100-25 MG tablet Take 1 tablet by mouth daily.   metFORMIN (GLUCOPHAGE) 500 MG tablet Take 1 tablet (500 mg total) by mouth daily with breakfast.   Multiple Vitamins-Minerals (MULTIVITAMIN WITH MINERALS) tablet Take 1 tablet by mouth every other day.   NARCAN 4 MG/0.1ML LIQD nasal spray kit Place 1 spray into the nose once.   Oxycodone HCl 20 MG TABS Take 20 mg by mouth every 4 (four) hours as needed (pain).   pantoprazole (PROTONIX) 40 MG tablet Take 1 tablet (40 mg total) by mouth 2 (two) times daily before a meal.   QUEtiapine (SEROQUEL) 25 MG tablet Take 1 tablet (25 mg total) by mouth at bedtime.   tiZANidine (ZANAFLEX) 4 MG tablet Take 4 mg by mouth every 8 (eight) hours.   valACYclovir (VALTREX) 1000 MG tablet Take 1,000 mg by mouth 2 (two) times daily as needed.   No facility-administered encounter medications on file as  of 04/01/2022.    Patient Active Problem List   Diagnosis Date Noted   Chronic GERD    Gastric erosion    Pulmonary hypertension ([place]) 53/64/6803   Diastolic heart failure ([place]) 03/19/2022   Other abnormalities of gait and mobility 03/02/2022   Obesity (BMI 30-39.9) 02/19/2022   Prediabetes 02/19/2022   Sinus bradycardia 02/19/2022   TIA (transient ischemic attack) 02/18/2022   Chronic pain 02/18/2022   Prolonged QT  syndrome 02/05/2022   Status post left breast lumpectomy 01/23/2021   History of total knee arthroplasty, right 04/17/2020   S/P TKR (total knee replacement) using cement, left 04/12/2019   Benign neoplasm of ascending colon    Diverticulosis of large intestine without diverticulitis    Pancreatic lesion 04/21/2018   Abdominal aortic atherosclerosis ([place]) 04/21/2018   Acute peptic ulcer of stomach    Trigger thumb of right hand 02/25/2018   Carpal tunnel syndrome on both sides 10/21/2017   CRP elevated 08/09/2017   Elevated C-reactive protein 07/28/2017   Osteoarthritis of both knees 02/19/2016   COPD, mild ([place]) 05/08/2015   Vitamin D deficiency 21/22/4825   Metabolic syndrome 00/37/0488   Eczema 05/07/2015   Chronic insomnia 05/07/2015   Hypertension, benign 05/07/2015   Dyslipidemia 05/07/2015   Hyperglycemia 05/07/2015   Chronic low back pain 05/07/2015   Irritable bowel syndrome with both constipation and diarrhea 05/07/2015   Gastroesophageal reflux disease without esophagitis 05/07/2015   Major depression, recurrent, chronic ([place]) 05/07/2015   Menopausal syndrome (hot flashes) 05/07/2015   History of postmenopausal bleeding 01/14/2015     Patient Care Plan: RN Care Management Plan of Care     Problem Identified: Disease Progression (Hypertension)      Long-Range Goal: Disease Progression Prevented or Minimized   Start Date: 04/01/2022  Expected End Date: 09/28/2022  Priority: High  Note:   Current Barriers:  Care Coordination needs related to Hypertension, CHF, COPD and Fall Risks.  RNCM Clinical Goal(s):  Patient will demonstrate Ongoing adherence to prescribed treatment plan for CHF, HTN, and COPD through collaboration with the provider, RN Care Manager and care team.  Interventions: 1:1 collaboration with primary care provider regarding development and update of comprehensive plan of care as evidenced by provider attestation and  co-signature Inter-disciplinary care team collaboration (see longitudinal plan of care) Evaluation of current treatment plan related to  self management and patient's adherence to plan as established by provider   Heart Failure Interventions:  (Status:  New goal.) Long Term Goal Wt Readings from Last 3 Encounters:  03/25/22 200 lb (90.7 kg)  03/23/22 204 lb (92.5 kg)  03/19/22 200 lb 1.6 oz (90.8 kg)    Discussed current plan for CHF management.  Provided information regarding recommended weight parameters. Encouraged to weigh daily and record readings. Encouraged to notify provider for weight gain greater than 3 lbs overnight or weight gain greater than 5 lbs within a week. Reports needing a new scale. She will contact her insurance provider to determine if the equipment can be provided to her at no cost. Discussed s/sx of fluid overload and indications for notifying a provider. Encouraged to assess symptoms daily and contact clinic with concerns as needed. Discussed nutritional intake. Advised to closely monitor sodium consumption and avoid highly processed foods when possible. Reviewed worsening s/sx related to CHF exacerbation and indications for seeking immediate medical attention.  COPD Interventions:  (Status:  New goal.) Long Term Goal Reviewed medications and COPD management.  Discussed triggers. Reports symptoms have been controlled.  Advised to continue taking needed precautions to prevent exacerbation.  Reviewed current action plan and reinforced importance of daily self-assessment. Advised to make an appointment with provider if experiencing moderate symptoms for greater than 48 hours without improvement.  Provided information regarding infection prevention and increased risk r/t COPD. Advised to utilize prevention strategies to reduce risk of respiratory infection  Reviewed worsening symptoms that require immediate medical attention.    Hypertension Interventions:  (Status:  New  goal.) Long Term Goal Last practice recorded BP readings:  BP Readings from Last 3 Encounters:  03/25/22 117/69  03/23/22 120/72  03/19/22 120/74  Most recent eGFR/CrCl:  Lab Results  Component Value Date   EGFR 102 03/02/2022    No components found for: "CRCL"  Reviewed medications and indications for use.  Provided information regarding established blood pressure parameters along with indications for notifying a provider. Advised to monitor and record readings.  Discussed compliance with recommended cardiac prudent diet. Encouraged to read nutrition labels and avoid highly processed foods when possible. Discussed complications of uncontrolled blood pressure.  Reviewed s/sx of heart attack, stroke and worsening symptoms that require immediate medical attention.  Fall Risk Interventions: (Status:  New goal.) Long Term Goal    02/20/2022    1:00 PM 03/02/2022    2:39 PM 03/19/2022   10:06 AM 03/25/2022    8:05 AM 04/01/2022    3:42 PM  Fall Risk  Falls in the past year?  _0 Was there an injury with Fall?  _1 Fall Risk Category Calculator  _2 Fall Risk Category  Moderate Moderate  Moderate  Patient Fall Risk Level Moderate fall risk Moderate fall risk Moderate fall risk High fall risk High fall risk  Patient at Risk for Falls Due to  History of fall(s);Impaired balance/gait;Impaired mobility History of fall(s)    Fall risk Follow up  Education provided;Falls prevention discussed;Falls evaluation completed Education provided;Falls evaluation completed;Falls prevention discussed     Reviewed medications and discussed potential side effects that may increase risks for falls.  Provided information regarding safety and fall prevention. Reports using assistive devices as advised. Currently using mostly cane and walker. Confirmed initiation of Home Health services. Receiving Physical Therapy and Occupational Therapy.  Discussed ability to perform ADL's and IADL's. Reports doing  most ADLs independently but requires assistance with IADLs. Reports family and friends are available to assist as needed. Declines current need for additional in-home services.    Patient Goals/Self-Care Activities: Take all medications as prescribed Attend all scheduled provider appointments Call pharmacy for medication refills 3-7 days in advance of running out of medications Perform all self care activities independently  Perform IADL's (shopping, preparing meals, housekeeping, managing finances) independently Call provider office for new concerns or questions    Follow Up Plan:   Will follow up in three months.        PLAN  A member of the care management team will follow up in three months.   Cristy [facility] Care Management [facility] [phone]

## 2022-04-06 DIAGNOSIS — I639 Cerebral infarction, unspecified: Secondary | ICD-10-CM | POA: Diagnosis not present

## 2022-04-06 DIAGNOSIS — G8929 Other chronic pain: Secondary | ICD-10-CM | POA: Diagnosis not present

## 2022-04-06 DIAGNOSIS — M545 Low back pain, unspecified: Secondary | ICD-10-CM | POA: Diagnosis not present

## 2022-04-06 DIAGNOSIS — G459 Transient cerebral ischemic attack, unspecified: Secondary | ICD-10-CM | POA: Diagnosis not present

## 2022-04-06 DIAGNOSIS — R29810 Facial weakness: Secondary | ICD-10-CM | POA: Diagnosis not present

## 2022-04-10 ENCOUNTER — Other Ambulatory Visit
Admission: RE | Admit: 2022-04-10 | Discharge: 2022-04-10 | Disposition: A | Discharge status: Home / Self Care | Payer: Medicare Other | Attending: Cardiology | Admitting: Cardiology

## 2022-04-10 DIAGNOSIS — I5030 Unspecified diastolic (congestive) heart failure: Secondary | ICD-10-CM | POA: Diagnosis not present

## 2022-04-10 LAB — BASIC METABOLIC PANEL
Anion gap: 9 (ref 5–15)
BUN: 16 mg/dL (ref 6–20)
CO2: 24 mmol/L (ref 22–32)
Calcium: 9.6 mg/dL (ref 8.9–10.3)
Chloride: 106 mmol/L (ref 98–111)
Creatinine, Ser: 0.65 mg/dL (ref 0.44–1.00)
GFR, Estimated: >60 mL/min (ref >60)
Glucose, Bld: 104 mg/dL — ABNORMAL HIGH (ref 70–99)
Potassium: 4.1 mmol/L (ref 3.5–5.1)
Sodium: 139 mmol/L (ref 135–145)

## 2022-04-14 DIAGNOSIS — G894 Chronic pain syndrome: Secondary | ICD-10-CM | POA: Diagnosis not present

## 2022-04-14 DIAGNOSIS — Z79891 Long term (current) use of opiate analgesic: Secondary | ICD-10-CM | POA: Diagnosis not present

## 2022-04-15 DIAGNOSIS — H04123 Dry eye syndrome of bilateral lacrimal glands: Secondary | ICD-10-CM | POA: Diagnosis not present

## 2022-04-15 NOTE — Patient Instructions (Signed)
Thank you for allowing the care management team to participate in your care.   Care Plan : RN Care Management Plan of Care       Problem: Disease Progression (HTN, CHF, COPD, Fall Risk)      Long-Range Goal: Disease Progression Prevented or Minimized   Start Date: 04/01/2022  Expected End Date: 09/28/2022  Priority: High  Note:   Current Barriers:  Care Coordination needs related to Hypertension, CHF, COPD and Fall Risks.  RNCM Clinical Goal(s):  Patient will demonstrate Ongoing adherence to prescribed treatment plan for CHF, HTN, and COPD through collaboration with the provider, RN Care Manager and care team.  Interventions: 1:1 collaboration with primary care provider regarding development and update of comprehensive plan of care as evidenced by provider attestation and co-signature Inter-disciplinary care team collaboration (see longitudinal plan of care) Evaluation of current treatment plan related to  self management and patient's adherence to plan as established by provider   Heart Failure Interventions:  (Status:  New goal.) Long Term Goal Wt Readings from Last 3 Encounters:  03/25/22 200 lb (90.7 kg)  03/23/22 204 lb (92.5 kg)  03/19/22 200 lb 1.6 oz (90.8 kg)    Discussed current plan for CHF management.  Provided information regarding recommended weight parameters. Encouraged to weigh daily and record readings. Encouraged to notify provider for weight gain greater than 3 lbs overnight or weight gain greater than 5 lbs within a week. Reports needing a new scale. She will contact her insurance provider to determine if the equipment can be provided to her at no cost. Discussed s/sx of fluid overload and indications for notifying a provider. Encouraged to assess symptoms daily and contact clinic with concerns as needed. Discussed nutritional intake. Advised to closely monitor sodium consumption and avoid highly processed foods when possible. Reviewed worsening s/sx related  to CHF exacerbation and indications for seeking immediate medical attention.  COPD Interventions:  (Status:  New goal.) Long Term Goal Reviewed medications and COPD management.  Discussed triggers. Reports symptoms have been controlled. Advised to continue taking needed precautions to prevent exacerbation.  Reviewed current action plan and reinforced importance of daily self-assessment. Advised to make an appointment with provider if experiencing moderate symptoms for greater than 48 hours without improvement.  Provided information regarding infection prevention and increased risk r/t COPD. Advised to utilize prevention strategies to reduce risk of respiratory infection  Reviewed worsening symptoms that require immediate medical attention.    Hypertension Interventions:  (Status:  New goal.) Long Term Goal Last practice recorded BP readings:  BP Readings from Last 3 Encounters:  03/25/22 117/69  03/23/22 120/72  03/19/22 120/74  Most recent eGFR/CrCl:  Lab Results  Component Value Date   EGFR 102 03/02/2022    No components found for: "CRCL"  Reviewed medications and indications for use.  Provided information regarding established blood pressure parameters along with indications for notifying a provider. Advised to monitor and record readings.  Discussed compliance with recommended cardiac prudent diet. Encouraged to read nutrition labels and avoid highly processed foods when possible. Discussed complications of uncontrolled blood pressure.  Reviewed s/sx of heart attack, stroke and worsening symptoms that require immediate medical attention.  Fall Risk Interventions: (Status:  New goal.) Long Term Goal    02/20/2022    1:00 PM 03/02/2022    2:39 PM 03/19/2022   10:06 AM 03/25/2022    8:05 AM 04/01/2022    3:42 PM  Fall Risk  Falls in the past year?  1 1  1  Was there an injury with Fall?  _0 Fall Risk Category Calculator  _1 Fall Risk Category  Moderate Moderate  Moderate   Patient Fall Risk Level Moderate fall risk Moderate fall risk Moderate fall risk High fall risk High fall risk  Patient at Risk for Falls Due to  History of fall(s);Impaired balance/gait;Impaired mobility History of fall(s)    Fall risk Follow up  Education provided;Falls prevention discussed;Falls evaluation completed Education provided;Falls evaluation completed;Falls prevention discussed     Reviewed medications and discussed potential side effects that may increase risks for falls.  Provided information regarding safety and fall prevention. Reports using assistive devices as advised. Currently using mostly cane and walker. Confirmed initiation of Home Health services. Receiving Physical Therapy and Occupational Therapy.  Discussed ability to perform ADL's and IADL's. Reports doing most ADLs independently but requires assistance with IADLs. Reports family and friends are available to assist as needed. Declines current need for additional in-home services.    Patient Goals/Self-Care Activities: Take all medications as prescribed Attend all scheduled provider appointments Call pharmacy for medication refills 3-7 days in advance of running out of medications Perform all self care activities independently  Perform IADL's (shopping, preparing meals, housekeeping, managing finances) independently Call provider office for new concerns or questions    Follow Up Plan:   Will follow up in three months.      Consent to Services:   [name] was given information about Care Management services including:  Care Management services includes personalized support from designated clinical staff supervised by her physician, including individualized plan of care and coordination with other care providers 24/7 contact phone numbers for assistance for urgent and routine care needs. The patient may stop case management services at any time by phone call to the office staff.   Patient agreed to services  and consent obtained.   [name] verbalized understanding of instructions, educational materials, and care plan provided and declined offer to receive copy of patient instructions, educational materials, and care plan.   A member of the care management team will follow up in three months.   [name] Care Management [facility] [phone]

## 2022-04-17 DIAGNOSIS — G459 Transient cerebral ischemic attack, unspecified: Secondary | ICD-10-CM | POA: Diagnosis not present

## 2022-05-04 NOTE — Progress Notes (Deleted)
   Cardiology Clinic Note   Patient Name: [name] Date of Encounter: 05/04/2022  Primary Care Provider:  Sowles, Krichna, MD Primary Cardiologist:  Brian Agbor-Etang, MD  Patient Profile    61 year old female with a past medical history of HFpEF, essential hypertension, hyperlipidemia, diabetes, former smoker x 15+ years, COPD, and peripheral edema who is here for follow up on her HFpEF.   Past Medical History    Past Medical History:  Diagnosis Date   Abdominal aortic atherosclerosis (HCC)    Anemia    Arthritis    bilateral knees   Bronchitis    Carpal tunnel syndrome, bilateral    Contact dermatitis and other eczema, due to unspecified cause    COPD (chronic obstructive pulmonary disease) (HCC)    DDD (degenerative disc disease)    Depressive disorder    Diverticulosis    Dysmetabolic syndrome X    Eczema    GERD (gastroesophageal reflux disease)    Hyperlipidemia    Hypertension    IBS (irritable bowel syndrome)    Insomnia    Lumbago    Nonspecific abnormal electrocardiogram (ECG) (EKG)    Osteoarthritis of both knees    Other ovarian failure(256.39)    Peptic ulcer    Postmenopausal atrophic vaginitis    Symptomatic menopausal or female climacteric states    Unspecified vitamin D deficiency    Past Surgical History:  Procedure Laterality Date   ACHILLES TENDON SURGERY Left 01/02/2022   Procedure: ACHILLES TENDON REPAIR-SECONDARY;  Surgeon: Fowler, Justin, DPM;  Location: [facility];  Service: Podiatry;  Laterality: Left;   BACK SURGERY     BREAST EXCISIONAL BIOPSY Left ?   neg   BREAST LUMPECTOMY Left 01/08/2021   Procedure: BREAST LUMPECTOMY;  Surgeon: Rodenberg, Denny, MD;  Location: [facility];  Service: General;  Laterality: Left;   CARPAL TUNNEL RELEASE Left 08/15/2018   Procedure: CARPAL TUNNEL RELEASE;  Surgeon: Miller, Howard, MD;  Location: [facility];  Service: Orthopedics;  Laterality: Left;   CERVICAL DISCECTOMY     x 2; metal plate    CHOLECYSTECTOMY     COLONOSCOPY WITH PROPOFOL N/A 08/08/2018   Procedure: COLONOSCOPY WITH PROPOFOL;  Surgeon: Tahiliani, Varnita B, MD;  Location: [facility];  Service: Endoscopy;  Laterality: N/A;   COLONOSCOPY WITH PROPOFOL N/A 12/01/2021   Procedure: COLONOSCOPY WITH PROPOFOL;  Surgeon: Vanga, Rohini Reddy, MD;  Location: [facility];  Service: Gastroenterology;  Laterality: N/A;   DILATATION & CURETTAGE/HYSTEROSCOPY WITH MYOSURE N/A 03/21/2015   Procedure: DILATATION & CURETTAGE/HYSTEROSCOPY;  Surgeon: Charlie Pickens, MD;  Location: [facility];  Service: Gynecology;  Laterality: N/A;   DILATION AND CURETTAGE OF UTERUS     ENDOMETRIAL ABLATION     ESOPHAGOGASTRODUODENOSCOPY (EGD) WITH PROPOFOL N/A 04/13/2018   Procedure: ESOPHAGOGASTRODUODENOSCOPY (EGD) WITH PROPOFOL;  Surgeon: Tahiliani, Varnita B, MD;  Location: [facility];  Service: Endoscopy;  Laterality: N/A;   ESOPHAGOGASTRODUODENOSCOPY (EGD) WITH PROPOFOL N/A 08/08/2018   Procedure: ESOPHAGOGASTRODUODENOSCOPY (EGD) WITH PROPOFOL;  Surgeon: Tahiliani, Varnita B, MD;  Location: [facility];  Service: Endoscopy;  Laterality: N/A;   ESOPHAGOGASTRODUODENOSCOPY (EGD) WITH PROPOFOL N/A 03/25/2022   Procedure: ESOPHAGOGASTRODUODENOSCOPY (EGD) WITH PROPOFOL;  Surgeon: Vanga, Rohini Reddy, MD;  Location: [facility];  Service: Gastroenterology;  Laterality: N/A;   ESOPHAGOGASTRODUODENOSCOPY (EGD) WITH PROPOFOL N/A 03/27/2022   Procedure: ESOPHAGOGASTRODUODENOSCOPY (EGD) WITH PROPOFOL;  Surgeon: Vanga, Rohini Reddy, MD;  Location: [facility];  Service: Gastroenterology;  Laterality: N/A;   JOINT REPLACEMENT     OSTECTOMY   Left 01/02/2022   Procedure: CALCANEAL EXOSTECTOMY;  Surgeon: Samara Deist, DPM;  Location: [facility];  Service: Podiatry;  Laterality: Left;   SPINAL FUSION     lumbar x2   TONSILLECTOMY     TOTAL KNEE ARTHROPLASTY Left 04/12/2019   Procedure: TOTAL KNEE ARTHROPLASTY;  Surgeon: Lovell Sheehan, MD;  Location: [facility];  Service: Orthopedics;  Laterality: Left;   TOTAL KNEE ARTHROPLASTY Right 04/17/2020   Procedure: TOTAL KNEE ARTHROPLASTY;  Surgeon: Lovell Sheehan, MD;  Location: [facility];  Service: Orthopedics;  Laterality: Right;   TUBAL LIGATION      Allergies  No Known Allergies  History of Present Illness    61 year old female with mentioned past medical history of essential hypertension, hyperlipidemia, diabetes, former smoker x15+ years, COPD, heart failure preserved ejection fraction, and peripheral edema.  She had recently been admitted on 02/18/2022 due to facial droop.  Her symptoms resolved upon arrival to the emergency department she was diagnosed with a TIA.  At that time she was started on aspirin and Lipitor.  She started having symptoms of peripheral edema that she stated was ongoing for about a year but denied any palpitations, shortness of breath or chest pain.  She did also endorse some occasional orthopnea.  She had an echocardiogram that was done on 02/19/2022 which showed an EF of 60 to 65% with grade 2 diastolic dysfunction.  There were some concerns of TIA versus CVA and she was placed on a long-term monitor which showed no evidence of atrial fibrillation or atrial flutter to suggest etiology of his CVA.  Patient triggered events associated with sinus rhythm or sinus tachycardia.  She had a minimum heart rate of 66 and maximal heart rate of 169 which was 101.  Predominant underlying rhythm was sinus rhythm.  Overall she had a benign cardiac monitor. Since she is back in clinic today......  Home Medications    Current Outpatient Medications  Medication Sig Dispense Refill   amitriptyline (ELAVIL) 10 MG tablet Take 1 tablet (10 mg total) by mouth at bedtime. 90 tablet 1   amLODipine (NORVASC) 5 MG tablet Take 1 tablet (5 mg total) by mouth daily. 90 tablet 1   aspirin EC 81 MG EC tablet Take 1 tablet (81 mg total) by mouth daily. Swallow whole. 30 tablet 11   atorvastatin (LIPITOR)  40 MG tablet Take 1 tablet (40 mg total) by mouth at bedtime. 90 tablet 1   cholecalciferol (VITAMIN D) 1000 units tablet Take 1,000 Units by mouth daily.     Cyanocobalamin (B-12) 1000 MCG SUBL Place 1 each under the tongue 3 (three) times a week.     desvenlafaxine (PRISTIQ) 50 MG 24 hr tablet Take 1 tablet (50 mg total) by mouth daily. 90 tablet 1   famotidine (PEPCID) 20 MG tablet TAKE 1 TABLET(20 MG) BY MOUTH TWICE DAILY AS NEEDED FOR HEARTBURN OR INDIGESTION 180 tablet 1   furosemide (LASIX) 40 MG tablet Take 0.5 tablets (20 mg total) by mouth daily. 30 tablet 3   gabapentin (NEURONTIN) 300 MG capsule Take 300 mg by mouth every 6 (six) hours as needed (Nerve pain).     losartan-hydrochlorothiazide (HYZAAR) 100-25 MG tablet Take 1 tablet by mouth daily. 90 tablet 0   metFORMIN (GLUCOPHAGE) 500 MG tablet Take 1 tablet (500 mg total) by mouth daily with breakfast. 90 tablet 1   Multiple Vitamins-Minerals (MULTIVITAMIN WITH MINERALS) tablet Take 1 tablet by mouth every other day.     NARCAN  4 MG/0.1ML LIQD nasal spray kit Place 1 spray into the nose once.     Oxycodone HCl 20 MG TABS Take 20 mg by mouth every 4 (four) hours as needed (pain).     pantoprazole (PROTONIX) 40 MG tablet Take 1 tablet (40 mg total) by mouth 2 (two) times daily before a meal. 30 tablet 3   QUEtiapine (SEROQUEL) 25 MG tablet Take 1 tablet (25 mg total) by mouth at bedtime. 90 tablet 1   tiZANidine (ZANAFLEX) 4 MG tablet Take 4 mg by mouth every 8 (eight) hours.     valACYclovir (VALTREX) 1000 MG tablet Take 1,000 mg by mouth 2 (two) times daily as needed.     No current facility-administered medications for this visit.     Family History    Family History  Problem Relation Age of Onset   Heart disease Mother    Thyroid cancer Mother    Congestive Heart Failure Mother    Hypertension Father    Heart failure Maternal Uncle    Breast cancer Maternal Grandmother    Heart failure Maternal Grandfather     Depression Daughter    Asthma Daughter    She indicated that her mother is deceased. She indicated that her father is alive. She indicated that the status of her maternal grandmother is unknown. She indicated that her maternal grandfather is deceased. She indicated that her daughter is alive. She indicated that her maternal uncle is alive.  Social History    Social History   Socioeconomic History   Marital status: Married    Spouse name: Samuel   Number of children: 4   Years of education: Not on file   Highest education level: 12th grade  Occupational History   Occupation: disabled    Comment: chronic back pain   Tobacco Use   Smoking status: Former    Packs/day: 1.00    Years: 20.00    Total pack years: 20.00    Types: Cigarettes    Start date: 07/26/2002    Quit date: 04/06/2019    Years since quitting: 3.0   Smokeless tobacco: Never   Tobacco comments:    Quit Date 04/06/2019  Vaping Use   Vaping Use: Never used  Substance and Sexual Activity   Alcohol use: Yes    Comment: occasional beer   Drug use: No   Sexual activity: Yes    Partners: Male    Birth control/protection: Other-see comments    Comment: Ablation  Other Topics Concern   Not on file  Social History Narrative   Not on file   Social Determinants of Health   Financial Resource Strain: Low Risk  (11/06/2021)   Overall Financial Resource Strain (CARDIA)    Difficulty of Paying Living Expenses: Not hard at all  Food Insecurity: No Food Insecurity (04/01/2022)   Hunger Vital Sign    Worried About Running Out of Food in the Last Year: Never true    Ran Out of Food in the Last Year: Never true  Transportation Needs: No Transportation Needs (04/01/2022)   PRAPARE - Transportation    Lack of Transportation (Medical): No    Lack of Transportation (Non-Medical): No  Physical Activity: Inactive (11/06/2021)   Exercise Vital Sign    Days of Exercise per Week: 0 days    Minutes of Exercise per Session: 0 min   Stress: No Stress Concern Present (11/06/2021)   [employer] - Occupational Stress Questionnaire    Feeling of   Stress : Not at all  Social Connections: Moderately Isolated (11/06/2021)   Social Connection and Isolation Panel [NHANES]    Frequency of Communication with Friends and Family: More than three times a week    Frequency of Social Gatherings with Friends and Family: Three times a week    Attends Religious Services: Never    Active Member of Clubs or Organizations: No    Attends Club or Organization Meetings: Never    Marital Status: Married  Intimate Partner Violence: Not At Risk (11/06/2021)   Humiliation, Afraid, Rape, and Kick questionnaire    Fear of Current or Ex-Partner: No    Emotionally Abused: No    Physically Abused: No    Sexually Abused: No     Review of Systems    General:  No chills, fever, night sweats or weight changes.  Cardiovascular:  No chest pain, dyspnea on exertion, edema, orthopnea, palpitations, paroxysmal nocturnal dyspnea. Dermatological: No rash, lesions/masses Respiratory: No cough, dyspnea Urologic: No hematuria, dysuria Abdominal:   No nausea, vomiting, diarrhea, bright red blood per rectum, melena, or hematemesis Neurologic:  No visual changes, wkns, changes in mental status. All other systems reviewed and are otherwise negative except as noted above.     Physical Exam    VS:  There were no vitals taken for this visit. , BMI There is no height or weight on file to calculate BMI.     GEN: Well nourished, well developed, in no acute distress. HEENT: normal. Neck: Supple, no JVD, carotid bruits, or masses. Cardiac: RRR, no murmurs, rubs, or gallops. No clubbing, cyanosis, edema.  Radials/DP/PT 2+ and equal bilaterally.  Respiratory:  Respirations regular and unlabored, clear to auscultation bilaterally. GI: Soft, nontender, nondistended, BS + x 4. MS: no deformity or atrophy. Skin: warm and dry, no rash. Neuro:   Strength and sensation are intact. Psych: Normal affect.  Accessory Clinical Findings    ECG personally reviewed by me today- *** - No acute changes  Lab Results  Component Value Date   WBC 9.2 03/02/2022   HGB 12.7 03/02/2022   HCT 38.7 03/02/2022   MCV 92.4 03/02/2022   PLT 348 03/02/2022   Lab Results  Component Value Date   CREATININE 0.65 04/10/2022   BUN 16 04/10/2022   NA 139 04/10/2022   K 4.1 04/10/2022   CL 106 04/10/2022   CO2 24 04/10/2022   Lab Results  Component Value Date   ALT 16 03/02/2022   AST 19 03/02/2022   ALKPHOS 82 02/18/2022   BILITOT 0.5 03/02/2022   Lab Results  Component Value Date   CHOL 120 02/19/2022   HDL 34 (L) 02/19/2022   LDLCALC 34 02/19/2022   TRIG 258 (H) 02/19/2022   CHOLHDL 3.5 02/19/2022    Lab Results  Component Value Date   HGBA1C 6.4 (H) 02/18/2022    Assessment & Plan   1.  ***  Damier Disano, NP 05/04/2022, 4:16 PM   

## 2022-05-06 ENCOUNTER — Ambulatory Visit: Payer: Medicare Other | Admitting: Medical

## 2022-05-06 NOTE — Progress Notes (Unsigned)
Cardiology Clinic Note   Patient Name: [name] Date of Encounter: 05/04/2022  Primary Care Provider:  Steele Sizer, MD Primary Cardiologist:  Kate Sable, MD  Patient Profile    61 year old female with a past medical history of HFpEF, essential hypertension, hyperlipidemia, diabetes, former smoker x 15+ years, COPD, and peripheral edema who is here for follow up on her HFpEF.   Past Medical History    Past Medical History:  Diagnosis Date   Abdominal aortic atherosclerosis (HCC)    Anemia    Arthritis    bilateral knees   Bronchitis    Carpal tunnel syndrome, bilateral    Contact dermatitis and other eczema, due to unspecified cause    COPD (chronic obstructive pulmonary disease) (HCC)    DDD (degenerative disc disease)    Depressive disorder    Diverticulosis    Dysmetabolic syndrome X    Eczema    GERD (gastroesophageal reflux disease)    Hyperlipidemia    Hypertension    IBS (irritable bowel syndrome)    Insomnia    Lumbago    Nonspecific abnormal electrocardiogram (ECG) (EKG)    Osteoarthritis of both knees    Other ovarian failure(256.39)    Peptic ulcer    Postmenopausal atrophic vaginitis    Symptomatic menopausal or female climacteric states    Unspecified vitamin D deficiency    Past Surgical History:  Procedure Laterality Date   ACHILLES TENDON SURGERY Left 01/02/2022   Procedure: ACHILLES TENDON REPAIR-SECONDARY;  Surgeon: Samara Deist, DPM;  Location: [facility];  Service: Podiatry;  Laterality: Left;   BACK SURGERY     BREAST EXCISIONAL BIOPSY Left ?   neg   BREAST LUMPECTOMY Left 01/08/2021   Procedure: BREAST LUMPECTOMY;  Surgeon: Ronny Bacon, MD;  Location: [facility];  Service: General;  Laterality: Left;   CARPAL TUNNEL RELEASE Left 08/15/2018   Procedure: CARPAL TUNNEL RELEASE;  Surgeon: Earnestine Leys, MD;  Location: [facility];  Service: Orthopedics;  Laterality: Left;   CERVICAL DISCECTOMY     x 2; metal plate    CHOLECYSTECTOMY     COLONOSCOPY WITH PROPOFOL N/A 08/08/2018   Procedure: COLONOSCOPY WITH PROPOFOL;  Surgeon: Virgel Manifold, MD;  Location: [facility];  Service: Endoscopy;  Laterality: N/A;   COLONOSCOPY WITH PROPOFOL N/A 12/01/2021   Procedure: COLONOSCOPY WITH PROPOFOL;  Surgeon: Lin Landsman, MD;  Location: [facility] ENDOSCOPY;  Service: Gastroenterology;  Laterality: N/A;   DILATATION & CURETTAGE/HYSTEROSCOPY WITH MYOSURE N/A 03/21/2015   Procedure: DILATATION & CURETTAGE/HYSTEROSCOPY;  Surgeon: Aletha Halim, MD;  Location: [facility];  Service: Gynecology;  Laterality: N/A;   DILATION AND CURETTAGE OF UTERUS     ENDOMETRIAL ABLATION     ESOPHAGOGASTRODUODENOSCOPY (EGD) WITH PROPOFOL N/A 04/13/2018   Procedure: ESOPHAGOGASTRODUODENOSCOPY (EGD) WITH PROPOFOL;  Surgeon: Virgel Manifold, MD;  Location: [facility] ENDOS[facility];  Service: Endoscopy;  Laterality: N/A;   ESOPHAGOGASTRODUODENOSCOPY (EGD) WITH PROPOFOL N/A 08/08/2018   Procedure: ESOPHAGOGASTRODUODENOSCOPY (EGD) WITH PROPOFOL;  Surgeon: Virgel Manifold, MD;  Location: [facility] ENDOSCOPY;  Service: Endoscopy;  Laterality: N/A;   ESOPHAGOGASTRODUODENOSCOPY (EGD) WITH PROPOFOL N/A 03/25/2022   Procedure: ESOPHAGOGASTRODUODENOSCOPY (EGD) WITH PROPOFOL;  Surgeon: Lin Landsman, MD;  Location: [facility] ENDOSCOPY;  Service: Gastroenterology;  Laterality: N/A;   ESOPHAGOGASTRODUODENOSCOPY (EGD) WITH PROPOFOL N/A 03/27/2022   Procedure: ESOPHAGOGASTRODUODENOSCOPY (EGD) WITH PROPOFOL;  Surgeon: Lin Landsman, MD;  Location: [facility] ENDOSCOPY;  Service: Gastroenterology;  Laterality: N/A;   JOINT REPLACEMENT     OSTECTOMY  Left 01/02/2022   Procedure: CALCANEAL EXOSTECTOMY;  Surgeon: Samara Deist, DPM;  Location: [facility];  Service: Podiatry;  Laterality: Left;   SPINAL FUSION     lumbar x2   TONSILLECTOMY     TOTAL KNEE ARTHROPLASTY Left 04/12/2019   Procedure: TOTAL KNEE ARTHROPLASTY;  Surgeon: Lovell Sheehan, MD;  Location: [facility]  ORS;  Service: Orthopedics;  Laterality: Left;   TOTAL KNEE ARTHROPLASTY Right 04/17/2020   Procedure: TOTAL KNEE ARTHROPLASTY;  Surgeon: Lovell Sheehan, MD;  Location: [facility];  Service: Orthopedics;  Laterality: Right;   TUBAL LIGATION      Allergies  No Known Allergies  History of Present Illness    61 year old female with mentioned past medical history of essential hypertension, hyperlipidemia, diabetes, former smoker x15+ years, COPD, heart failure preserved ejection fraction, and peripheral edema.  She had recently been admitted on 02/18/2022 due to facial droop.  Her symptoms resolved upon arrival to the emergency department she was diagnosed with a TIA.  At that time she was started on aspirin and Lipitor.  She started having symptoms of peripheral edema that she stated was ongoing for about a year but denied any palpitations, shortness of breath or chest pain.  She did also endorse some occasional orthopnea.  She had an echocardiogram that was done on 02/19/2022 which showed an EF of 60 to 65% with grade 2 diastolic dysfunction.  There were some concerns of TIA versus CVA and she was placed on a long-term monitor which showed no evidence of atrial fibrillation or atrial flutter to suggest etiology of his CVA.  Patient triggered events associated with sinus rhythm or sinus tachycardia.  She had a minimum heart rate of 66 and maximal heart rate of 169 which was 101.  Predominant underlying rhythm was sinus rhythm.  Overall she had a benign cardiac monitor. Since she is back in clinic today......  Home Medications    Current Outpatient Medications  Medication Sig Dispense Refill   amitriptyline (ELAVIL) 10 MG tablet Take 1 tablet (10 mg total) by mouth at bedtime. 90 tablet 1   amLODipine (NORVASC) 5 MG tablet Take 1 tablet (5 mg total) by mouth daily. 90 tablet 1   aspirin EC 81 MG EC tablet Take 1 tablet (81 mg total) by mouth daily. Swallow whole. 30 tablet 11   atorvastatin (LIPITOR)  40 MG tablet Take 1 tablet (40 mg total) by mouth at bedtime. 90 tablet 1   cholecalciferol (VITAMIN D) 1000 units tablet Take 1,000 Units by mouth daily.     Cyanocobalamin (B-12) 1000 MCG SUBL Place 1 each under the tongue 3 (three) times a week.     desvenlafaxine (PRISTIQ) 50 MG 24 hr tablet Take 1 tablet (50 mg total) by mouth daily. 90 tablet 1   famotidine (PEPCID) 20 MG tablet TAKE 1 TABLET(20 MG) BY MOUTH TWICE DAILY AS NEEDED FOR HEARTBURN OR INDIGESTION 180 tablet 1   furosemide (LASIX) 40 MG tablet Take 0.5 tablets (20 mg total) by mouth daily. 30 tablet 3   gabapentin (NEURONTIN) 300 MG capsule Take 300 mg by mouth every 6 (six) hours as needed (Nerve pain).     losartan-hydrochlorothiazide (HYZAAR) 100-25 MG tablet Take 1 tablet by mouth daily. 90 tablet 0   metFORMIN (GLUCOPHAGE) 500 MG tablet Take 1 tablet (500 mg total) by mouth daily with breakfast. 90 tablet 1   Multiple Vitamins-Minerals (MULTIVITAMIN WITH MINERALS) tablet Take 1 tablet by mouth every other day.     NARCAN  4 MG/0.1ML LIQD nasal spray kit Place 1 spray into the nose once.     Oxycodone HCl 20 MG TABS Take 20 mg by mouth every 4 (four) hours as needed (pain).     pantoprazole (PROTONIX) 40 MG tablet Take 1 tablet (40 mg total) by mouth 2 (two) times daily before a meal. 30 tablet 3   QUEtiapine (SEROQUEL) 25 MG tablet Take 1 tablet (25 mg total) by mouth at bedtime. 90 tablet 1   tiZANidine (ZANAFLEX) 4 MG tablet Take 4 mg by mouth every 8 (eight) hours.     valACYclovir (VALTREX) 1000 MG tablet Take 1,000 mg by mouth 2 (two) times daily as needed.     No current facility-administered medications for this visit.     Family History    Family History  Problem Relation Age of Onset   Heart disease Mother    Thyroid cancer Mother    Congestive Heart Failure Mother    Hypertension Father    Heart failure Maternal Uncle    Breast cancer Maternal Grandmother    Heart failure Maternal Grandfather     Depression Daughter    Asthma Daughter    She indicated that her mother is deceased. She indicated that her father is alive. She indicated that the status of her maternal grandmother is unknown. She indicated that her maternal grandfather is deceased. She indicated that her daughter is alive. She indicated that her maternal uncle is alive.  Social History    Social History   Socioeconomic History   Marital status: Married    Spouse name: Mikeal Hawthorne   Number of children: 4   Years of education: Not on file   Highest education level: 12th grade  Occupational History   Occupation: disabled    Comment: chronic back pain   Tobacco Use   Smoking status: Former    Packs/day: 1.00    Years: 20.00    Total pack years: 20.00    Types: Cigarettes    Start date: 07/26/2002    Quit date: 04/06/2019    Years since quitting: 3.0   Smokeless tobacco: Never   Tobacco comments:    Quit Date 04/06/2019  Vaping Use   Vaping Use: Never used  Substance and Sexual Activity   Alcohol use: Yes    Comment: occasional beer   Drug use: No   Sexual activity: Yes    Partners: Male    Birth control/protection: Other-see comments    Comment: Ablation  Other Topics Concern   Not on file  Social History Narrative   Not on file   Social Determinants of Health   Financial Resource Strain: Low Risk  (11/06/2021)   Overall Financial Resource Strain (CARDIA)    Difficulty of Paying Living Expenses: Not hard at all  Food Insecurity: No Food Insecurity (04/01/2022)   Hunger Vital Sign    Worried About Running Out of Food in the Last Year: Never true    Ran Out of Food in the Last Year: Never true  Transportation Needs: No Transportation Needs (04/01/2022)   PRAPARE - Hydrologist (Medical): No    Lack of Transportation (Non-Medical): No  Physical Activity: Inactive (11/06/2021)   Exercise Vital Sign    Days of Exercise per Week: 0 days    Minutes of Exercise per Session: 0 min   Stress: No Stress Concern Present (11/06/2021)   [place]    Feeling of  Stress : Not at all  Social Connections: Moderately Isolated (11/06/2021)   Social Connection and Isolation Panel [NHANES]    Frequency of Communication with Friends and Family: More than three times a week    Frequency of Social Gatherings with Friends and Family: Three times a week    Attends Religious Services: Never    Active Member of Clubs or Organizations: No    Attends Archivist Meetings: Never    Marital Status: Married  Human resources officer Violence: Not At Risk (11/06/2021)   Humiliation, Afraid, Rape, and Kick questionnaire    Fear of Current or Ex-Partner: No    Emotionally Abused: No    Physically Abused: No    Sexually Abused: No     Review of Systems    General:  No chills, fever, night sweats or weight changes.  Cardiovascular:  No chest pain, dyspnea on exertion, edema, orthopnea, palpitations, paroxysmal nocturnal dyspnea. Dermatological: No rash, lesions/masses Respiratory: No cough, dyspnea Urologic: No hematuria, dysuria Abdominal:   No nausea, vomiting, diarrhea, bright red blood per rectum, melena, or hematemesis Neurologic:  No visual changes, wkns, changes in mental status. All other systems reviewed and are otherwise negative except as noted above.     Physical Exam    VS:  There were no vitals taken for this visit. , BMI There is no height or weight on file to calculate BMI.     GEN: Well nourished, well developed, in no acute distress. HEENT: normal. Neck: Supple, no JVD, carotid bruits, or masses. Cardiac: RRR, no murmurs, rubs, or gallops. No clubbing, cyanosis, edema.  Radials/DP/PT 2+ and equal bilaterally.  Respiratory:  Respirations regular and unlabored, clear to auscultation bilaterally. GI: Soft, nontender, nondistended, BS + x 4. MS: no deformity or atrophy. Skin: warm and dry, no rash. Neuro:   Strength and sensation are intact. Psych: Normal affect.  Accessory Clinical Findings    ECG personally reviewed by me today- *** - No acute changes  Lab Results  Component Value Date   WBC 9.2 03/02/2022   HGB 12.7 03/02/2022   HCT 38.7 03/02/2022   MCV 92.4 03/02/2022   PLT 348 03/02/2022   Lab Results  Component Value Date   CREATININE 0.65 04/10/2022   BUN 16 04/10/2022   NA 139 04/10/2022   K 4.1 04/10/2022   CL 106 04/10/2022   CO2 24 04/10/2022   Lab Results  Component Value Date   ALT 16 03/02/2022   AST 19 03/02/2022   ALKPHOS 82 02/18/2022   BILITOT 0.5 03/02/2022   Lab Results  Component Value Date   CHOL 120 02/19/2022   HDL 34 (L) 02/19/2022   LDLCALC 34 02/19/2022   TRIG 258 (H) 02/19/2022   CHOLHDL 3.5 02/19/2022    Lab Results  Component Value Date   HGBA1C 6.4 (H) 02/18/2022    Assessment & Plan   1.  ***  Jc Veron, NP 05/04/2022, 4:16 PM

## 2022-05-07 ENCOUNTER — Ambulatory Visit (INDEPENDENT_AMBULATORY_CARE_PROVIDER_SITE_OTHER): Payer: Medicare Other | Admitting: Cardiology

## 2022-05-07 ENCOUNTER — Encounter: Payer: Self-pay | Admitting: Medical

## 2022-05-07 VITALS — BP 130/80 | HR 99 | Ht 68.0 in | Wt 205.0 lb

## 2022-05-07 DIAGNOSIS — I5030 Unspecified diastolic (congestive) heart failure: Secondary | ICD-10-CM

## 2022-05-07 DIAGNOSIS — J449 Chronic obstructive pulmonary disease, unspecified: Secondary | ICD-10-CM | POA: Diagnosis not present

## 2022-05-07 DIAGNOSIS — G459 Transient cerebral ischemic attack, unspecified: Secondary | ICD-10-CM | POA: Diagnosis not present

## 2022-05-07 DIAGNOSIS — I10 Essential (primary) hypertension: Secondary | ICD-10-CM | POA: Diagnosis not present

## 2022-05-07 MED ORDER — FUROSEMIDE 40 MG PO TABS: 40.0000 mg | ORAL_TABLET | Freq: Every day | ORAL | 3 refills | Status: DC

## 2022-05-07 NOTE — Patient Instructions (Signed)
Medication Instructions:  Your physician recommends that you continue on your current medications as directed. Please refer to the Current Medication list given to you today.  *If you need a refill on your cardiac medications before your next appointment, please call your pharmacy*   Lab Work: NONE If you have labs (blood work) drawn today and your tests are completely normal, you will receive your results only by: [place] (if you have MyChart) OR A paper copy in the mail If you have any lab test that is abnormal or we need to change your treatment, we will call you to review the results.   Testing/Procedures: NONE   Follow-Up: At [facility], you and your health needs are our priority.  As part of our continuing mission to provide you with exceptional heart care, we have created designated Provider Care Teams.  These Care Teams include your primary Cardiologist (physician) and Advanced Practice Providers (APPs -  Physician Assistants and Nurse Practitioners) who all work together to provide you with the care you need, when you need it.  We recommend signing up for the patient portal called "MyChart".  Sign up information is provided on this After Visit Summary.  MyChart is used to connect with patients for Virtual Visits (Telemedicine).  Patients are able to view lab/test results, encounter notes, upcoming appointments, etc.  Non-urgent messages can be sent to your provider as well.   To learn more about what you can do with MyChart, go to [email or web address].    Your next appointment:   2 month(s)  The format for your next appointment:   In Person  Provider:   You may see Kate Sable, MD or one of the following Advanced Practice Providers on your designated Care Team:   Murray Hodgkins, NP Christell Faith, PA-C Cadence Kathlen Mody, PA-C{  Important Information About Sugar

## 2022-05-15 DIAGNOSIS — M79662 Pain in left lower leg: Secondary | ICD-10-CM | POA: Diagnosis not present

## 2022-05-15 DIAGNOSIS — M79661 Pain in right lower leg: Secondary | ICD-10-CM | POA: Diagnosis not present

## 2022-05-15 DIAGNOSIS — M545 Low back pain, unspecified: Secondary | ICD-10-CM | POA: Diagnosis not present

## 2022-05-15 DIAGNOSIS — M5432 Sciatica, left side: Secondary | ICD-10-CM | POA: Diagnosis not present

## 2022-05-15 DIAGNOSIS — M25561 Pain in right knee: Secondary | ICD-10-CM | POA: Diagnosis not present

## 2022-05-15 DIAGNOSIS — M25562 Pain in left knee: Secondary | ICD-10-CM | POA: Diagnosis not present

## 2022-05-15 DIAGNOSIS — M25511 Pain in right shoulder: Secondary | ICD-10-CM | POA: Diagnosis not present

## 2022-05-15 DIAGNOSIS — Z79891 Long term (current) use of opiate analgesic: Secondary | ICD-10-CM | POA: Diagnosis not present

## 2022-05-15 DIAGNOSIS — M542 Cervicalgia: Secondary | ICD-10-CM | POA: Diagnosis not present

## 2022-05-15 DIAGNOSIS — G894 Chronic pain syndrome: Secondary | ICD-10-CM | POA: Diagnosis not present

## 2022-05-15 DIAGNOSIS — M5431 Sciatica, right side: Secondary | ICD-10-CM | POA: Diagnosis not present

## 2022-05-15 DIAGNOSIS — M25512 Pain in left shoulder: Secondary | ICD-10-CM | POA: Diagnosis not present

## 2022-05-15 DIAGNOSIS — M25551 Pain in right hip: Secondary | ICD-10-CM | POA: Diagnosis not present

## 2022-05-15 DIAGNOSIS — M25552 Pain in left hip: Secondary | ICD-10-CM | POA: Diagnosis not present

## 2022-05-18 ENCOUNTER — Other Ambulatory Visit: Payer: Self-pay | Admitting: Family Medicine

## 2022-05-18 DIAGNOSIS — I10 Essential (primary) hypertension: Secondary | ICD-10-CM

## 2022-05-19 DIAGNOSIS — M7662 Achilles tendinitis, left leg: Secondary | ICD-10-CM | POA: Diagnosis not present

## 2022-05-19 DIAGNOSIS — M7732 Calcaneal spur, left foot: Secondary | ICD-10-CM | POA: Diagnosis not present

## 2022-05-28 ENCOUNTER — Other Ambulatory Visit: Payer: Self-pay | Admitting: Family Medicine

## 2022-05-28 DIAGNOSIS — I10 Essential (primary) hypertension: Secondary | ICD-10-CM

## 2022-06-08 ENCOUNTER — Ambulatory Visit: Payer: Self-pay

## 2022-06-08 NOTE — Chronic Care Management (AMB) (Unsigned)
   06/08/2022  [name] Sep 02, 1961 [id number]  Documentation encounter created to complete case transition. The care management team will continue to follow for care coordination.  [place] [facility] [phone]

## 2022-06-16 DIAGNOSIS — M25512 Pain in left shoulder: Secondary | ICD-10-CM | POA: Diagnosis not present

## 2022-06-16 DIAGNOSIS — M25561 Pain in right knee: Secondary | ICD-10-CM | POA: Diagnosis not present

## 2022-06-16 DIAGNOSIS — M25551 Pain in right hip: Secondary | ICD-10-CM | POA: Diagnosis not present

## 2022-06-16 DIAGNOSIS — Z79891 Long term (current) use of opiate analgesic: Secondary | ICD-10-CM | POA: Diagnosis not present

## 2022-06-16 DIAGNOSIS — G894 Chronic pain syndrome: Secondary | ICD-10-CM | POA: Diagnosis not present

## 2022-06-16 DIAGNOSIS — M542 Cervicalgia: Secondary | ICD-10-CM | POA: Diagnosis not present

## 2022-06-16 DIAGNOSIS — M5432 Sciatica, left side: Secondary | ICD-10-CM | POA: Diagnosis not present

## 2022-06-16 DIAGNOSIS — M79661 Pain in right lower leg: Secondary | ICD-10-CM | POA: Diagnosis not present

## 2022-06-16 DIAGNOSIS — M79662 Pain in left lower leg: Secondary | ICD-10-CM | POA: Diagnosis not present

## 2022-06-16 DIAGNOSIS — M25511 Pain in right shoulder: Secondary | ICD-10-CM | POA: Diagnosis not present

## 2022-06-16 DIAGNOSIS — M5431 Sciatica, right side: Secondary | ICD-10-CM | POA: Diagnosis not present

## 2022-06-16 DIAGNOSIS — M25562 Pain in left knee: Secondary | ICD-10-CM | POA: Diagnosis not present

## 2022-06-16 DIAGNOSIS — M25552 Pain in left hip: Secondary | ICD-10-CM | POA: Diagnosis not present

## 2022-06-16 DIAGNOSIS — M545 Low back pain, unspecified: Secondary | ICD-10-CM | POA: Diagnosis not present

## 2022-06-18 NOTE — Progress Notes (Deleted)
Name: [name]   MRN: [id number]    DOB: December 27, 1960   Date:06/18/2022       Progress Note  Subjective  Chief Complaint  Follow Up  HPI  Metabolic syndrome:  She denies polyphagia, polydipsia or polyuria.  Continue life style modification, she is taking Metformin occasionally only . Last hgbA1C was normal.    HTN: bp today is at goal, she has noticed intermittent epigastric pain that is described as something sitting on her chest, going to see cardiologist and will have EGD done also    Depression Major: long history of depression, got worse when mother died in 01-21-2017.  Still medications as prescribed, denies suicidal thoughts or ideation. She has been sick since April and feeling down , but wants to continue current regiment    COPD: She quit smoking 04/2019   She denies any wheezing , she has a dry cough. She has SOB with mild activity now but that is new - likely from CHF.    IBS:she still has intermittent diarrhea and constipation. She states symptoms controlled with medication    GERD: going to see Dr. Marius Ditch for EGD, she has intermittent pain on epigastric area    Atherosclerosis of aorta: she has been taking Atorvastatin now , denies side effects   CHF diastolic and pulmonary hypertension: found on Echo done 01/2022 , going to see Dr. Mylo Red next week. She has orthopnea and uses two pillows going on for months but no  lower extremity edema.   Left achilles tendinitis: had surgery on 05/16, off the boot and wearing regular shoes now, pain has improved  History of TIA: she went to [facility] on 02/18/2021 due to left facial droop and fatigue. Evaluation was negative, found to have anemia   Anemia acute: found during hospital stay in April, levels normalized but she will follow up for EGD next week.   B12 deficiency: discussed SL form to take at least 3 times a week.   Chronic pain: going to the pain clinic and takes medications as prescribed  Fever blisters: takes Valtrex about  once a month   Patient Active Problem List   Diagnosis Date Noted   Chronic GERD    Gastric erosion    Pulmonary hypertension ([place]) 40/98/1191   Diastolic heart failure ([place]) 03/19/2022   Other abnormalities of gait and mobility 03/02/2022   Obesity (BMI 30-39.9) 02/19/2022   Prediabetes 02/19/2022   Sinus bradycardia 02/19/2022   TIA (transient ischemic attack) 02/18/2022   Chronic pain 02/18/2022   Prolonged QT syndrome 02/05/2022   Status post left breast lumpectomy 01/23/2021   History of total knee arthroplasty, right 04/17/2020   S/P TKR (total knee replacement) using cement, left 04/12/2019   Benign neoplasm of ascending colon    Diverticulosis of large intestine without diverticulitis    Pancreatic lesion 04/21/2018   Abdominal aortic atherosclerosis ([place]) 04/21/2018   Acute peptic ulcer of stomach    Trigger thumb of right hand 02/25/2018   Carpal tunnel syndrome on both sides 10/21/2017   CRP elevated 08/09/2017   Elevated C-reactive protein 07/28/2017   Osteoarthritis of both knees 02/19/2016   COPD, mild ([place]) 05/08/2015   Vitamin D deficiency 47/82/9562   Metabolic syndrome 13/06/6577   Eczema 05/07/2015   Chronic insomnia 05/07/2015   Hypertension, benign 05/07/2015   Dyslipidemia 05/07/2015   Hyperglycemia 05/07/2015   Chronic low back pain 05/07/2015   Irritable bowel syndrome with both constipation and diarrhea 05/07/2015   Gastroesophageal reflux disease  without esophagitis 05/07/2015   Major depression, recurrent, chronic ([place]) 05/07/2015   Menopausal syndrome (hot flashes) 05/07/2015   History of postmenopausal bleeding 01/14/2015    Past Surgical History:  Procedure Laterality Date   ACHILLES TENDON SURGERY Left 01/02/2022   Procedure: ACHILLES TENDON REPAIR-SECONDARY;  Surgeon: Samara Deist, DPM;  Location: [facility];  Service: Podiatry;  Laterality: Left;   BACK SURGERY     BREAST EXCISIONAL BIOPSY Left ?   neg   BREAST LUMPECTOMY Left 01/08/2021    Procedure: BREAST LUMPECTOMY;  Surgeon: Ronny Bacon, MD;  Location: [facility];  Service: General;  Laterality: Left;   CARPAL TUNNEL RELEASE Left 08/15/2018   Procedure: CARPAL TUNNEL RELEASE;  Surgeon: Earnestine Leys, MD;  Location: [facility];  Service: Orthopedics;  Laterality: Left;   CERVICAL DISCECTOMY     x 2; metal plate   CHOLECYSTECTOMY     COLONOSCOPY WITH PROPOFOL N/A 08/08/2018   Procedure: COLONOSCOPY WITH PROPOFOL;  Surgeon: Virgel Manifold, MD;  Location: [facility];  Service: Endoscopy;  Laterality: N/A;   COLONOSCOPY WITH PROPOFOL N/A 12/01/2021   Procedure: COLONOSCOPY WITH PROPOFOL;  Surgeon: Lin Landsman, MD;  Location: [facility]COPY;  Service: Gastroenterology;  Laterality: N/A;   DILATATION & CURETTAGE/HYSTEROSCOPY WITH MYOSURE N/A 03/21/2015   Procedure: DILATATION & CURETTAGE/HYSTEROSCOPY;  Surgeon: Aletha Halim, MD;  Location: [facility];  Service: Gynecology;  Laterality: N/A;   DILATION AND CURETTAGE OF UTERUS     ENDOMETRIAL ABLATION     ESOPHAGOGASTRODUODENOSCOPY (EGD) WITH PROPOFOL N/A 04/13/2018   Procedure: ESOPHAGOGASTRODUODENOSCOPY (EGD) WITH PROPOFOL;  Surgeon: Virgel Manifold, MD;  Location: [facility];  Service: Endoscopy;  Laterality: N/A;   ESOPHAGOGASTRODUODENOSCOPY (EGD) WITH PROPOFOL N/A 08/08/2018   Procedure: ESOPHAGOGASTRODUODENOSCOPY (EGD) WITH PROPOFOL;  Surgeon: Virgel Manifold, MD;  Location: [facility];  Service: Endoscopy;  Laterality: N/A;   ESOPHAGOGASTRODUODENOSCOPY (EGD) WITH PROPOFOL N/A 03/25/2022   Procedure: ESOPHAGOGASTRODUODENOSCOPY (EGD) WITH PROPOFOL;  Surgeon: Lin Landsman, MD;  Location: [facility] ENDOSCOPY;  Service: Gastroenterology;  Laterality: N/A;   ESOPHAGOGASTRODUODENOSCOPY (EGD) WITH PROPOFOL N/A 03/27/2022   Procedure: ESOPHAGOGASTRODUODENOSCOPY (EGD) WITH PROPOFOL;  Surgeon: Lin Landsman, MD;  Location: [facility]COPY;  Service: Gastroenterology;  Laterality: N/A;   JOINT  REPLACEMENT     OSTECTOMY Left 01/02/2022   Procedure: CALCANEAL EXOSTECTOMY;  Surgeon: Samara Deist, DPM;  Location: [facility];  Service: Podiatry;  Laterality: Left;   SPINAL FUSION     lumbar x2   TONSILLECTOMY     TOTAL KNEE ARTHROPLASTY Left 04/12/2019   Procedure: TOTAL KNEE ARTHROPLASTY;  Surgeon: Lovell Sheehan, MD;  Location: [facility];  Service: Orthopedics;  Laterality: Left;   TOTAL KNEE ARTHROPLASTY Right 04/17/2020   Procedure: TOTAL KNEE ARTHROPLASTY;  Surgeon: Lovell Sheehan, MD;  Location: [facility];  Service: Orthopedics;  Laterality: Right;   TUBAL LIGATION      Family History  Problem Relation Age of Onset   Heart disease Mother    Thyroid cancer Mother    Congestive Heart Failure Mother    Hypertension Father    Heart failure Maternal Uncle    Diabetes Maternal Uncle    Kidney disease Maternal Uncle    Breast cancer Maternal Grandmother    Heart failure Maternal Grandfather    Depression Daughter    Asthma Daughter     Social History   Tobacco Use   Smoking status: Former    Packs/day: 1.00    Years: 20.00    Total pack years: 20.00  Types: Cigarettes    Start date: 07/26/2002    Quit date: 04/06/2019    Years since quitting: 3.2   Smokeless tobacco: Never   Tobacco comments:    Quit Date 04/06/2019  Substance Use Topics   Alcohol use: Yes    Comment: occasional beer     Current Outpatient Medications:    amitriptyline (ELAVIL) 10 MG tablet, Take 1 tablet (10 mg total) by mouth at bedtime., Disp: 90 tablet, Rfl: 1   amLODipine (NORVASC) 5 MG tablet, TAKE 1 TABLET BY MOUTH ONCE DAILY, Disp: 90 tablet, Rfl: 0   aspirin EC 81 MG EC tablet, Take 1 tablet (81 mg total) by mouth daily. Swallow whole., Disp: 30 tablet, Rfl: 11   atorvastatin (LIPITOR) 40 MG tablet, Take 1 tablet (40 mg total) by mouth at bedtime., Disp: 90 tablet, Rfl: 1   cholecalciferol (VITAMIN D) 1000 units tablet, Take 1,000 Units by mouth daily., Disp: , Rfl:    Cyanocobalamin  (B-12) 1000 MCG SUBL, Place 1 each under the tongue 3 (three) times a week., Disp: , Rfl:    desvenlafaxine (PRISTIQ) 50 MG 24 hr tablet, Take 1 tablet (50 mg total) by mouth daily., Disp: 90 tablet, Rfl: 1   famotidine (PEPCID) 20 MG tablet, TAKE 1 TABLET(20 MG) BY MOUTH TWICE DAILY AS NEEDED FOR HEARTBURN OR INDIGESTION, Disp: 180 tablet, Rfl: 1   furosemide (LASIX) 40 MG tablet, Take 1 tablet (40 mg total) by mouth daily., Disp: 90 tablet, Rfl: 3   gabapentin (NEURONTIN) 300 MG capsule, Take 300 mg by mouth every 6 (six) hours as needed (Nerve pain)., Disp: , Rfl:    losartan-hydrochlorothiazide (HYZAAR) 100-25 MG tablet, TAKE 1 TABLET BY MOUTH EVERY DAY, Disp: 90 tablet, Rfl: 0   metFORMIN (GLUCOPHAGE) 500 MG tablet, Take 1 tablet (500 mg total) by mouth daily with breakfast., Disp: 90 tablet, Rfl: 1   Multiple Vitamins-Minerals (MULTIVITAMIN WITH MINERALS) tablet, Take 1 tablet by mouth every other day., Disp: , Rfl:    NARCAN 4 MG/0.1ML LIQD nasal spray kit, Place 1 spray into the nose once., Disp: , Rfl:    Oxycodone HCl 20 MG TABS, Take 20 mg by mouth every 4 (four) hours as needed (pain)., Disp: , Rfl:    pantoprazole (PROTONIX) 40 MG tablet, Take 1 tablet (40 mg total) by mouth 2 (two) times daily before a meal., Disp: 30 tablet, Rfl: 3   QUEtiapine (SEROQUEL) 25 MG tablet, Take 1 tablet (25 mg total) by mouth at bedtime., Disp: 90 tablet, Rfl: 1   tiZANidine (ZANAFLEX) 4 MG tablet, Take 4 mg by mouth every 8 (eight) hours., Disp: , Rfl:    valACYclovir (VALTREX) 1000 MG tablet, Take 1,000 mg by mouth 2 (two) times daily as needed., Disp: , Rfl:   No Known Allergies  I personally reviewed active problem list, medication list, allergies, family history, social history, health maintenance with the patient/caregiver today.   ROS  ***  Objective  There were no vitals filed for this visit.  There is no height or weight on file to calculate BMI.  Physical Exam ***  Recent Results  (from the past 2160 hour(s))  Surgical pathology     Status: None   Collection Time: 03/25/22  8:34 AM  Result Value Ref Range   SURGICAL PATHOLOGY      SURGICAL PATHOLOGY CASE: [id number] PATIENT: Chana Bode Surgical Pathology Report     Specimen Submitted: A. Esophagus; cbx  Clinical History: Chronic GERD K21.9.  Food  in stomach      DIAGNOSIS: A.  ESOPHAGUS; COLD BIOPSY: - SQUAMOUS MUCOSA WITH CHANGES CONSISTENT WITH REFLUX. - NEGATIVE FOR INTRAEPITHELIAL EOSINOPHILS, DYSPLASIA, AND MALIGNANCY.  GROSS DESCRIPTION: A. Labeled: cbx esophagus chronic GERD Received: Formalin Collection time: 8:34 AM on 03/25/2022 Placed into formalin time: 8:34 AM on 03/25/2022 Tissue fragment(s): Multiple Size: Aggregate, 1.6 x 0.5 x 0.2 cm Description: White translucent soft tissue fragments Entirely submitted in 1 cassette.  RB 03/25/2022  Final Diagnosis performed by Quay Burow, MD.   Electronically signed 03/26/2022 9:34:50AM The electronic signature indicates that the named Attending Pathologist has evaluated the specimen Technical component performed at [facility], [address] [place], [place] [address] Lab:  [phone] Dir: Rush Farmer, MD, MMM  Professional component performed at [facility], [facility], [place], [place], [place] [address] Lab: [phone] Dir: Kathi Simpers, MD   Surgical pathology     Status: None   Collection Time: 03/27/22  9:09 AM  Result Value Ref Range   SURGICAL PATHOLOGY      SURGICAL PATHOLOGY CASE: [id number] PATIENT: Chana Bode Surgical Pathology Report     Specimen Submitted: A. Stomach, random; cbx  Clinical History: Chronic GERD K21.9.  Hiatal hernia, gastric erosions      DIAGNOSIS: A.  STOMACH, RANDOM; COLD BIOPSY: - ANTRAL MUCOSA WITH HEALING EROSIVE AND REACTIVE GASTRITIS. - NEGATIVE FOR H. PYLORI, INTESTINAL METAPLASIA, DYSPLASIA, AND MALIGNANCY.  GROSS DESCRIPTION: A.  Labeled: Cbx random gastric Received: Formalin Collection time: 9:09 AM on 03/30/2022 Placed into formalin time: 9:09 AM on 03/30/2022 Tissue fragment(s): Multiple Size: Aggregate, 1.0 x 0.4 x 0.1 cm Description: Received are pink soft tissue fragments, admixed with intestinal debris.  The ratio of soft tissue to intestinal debris is 90: 10. Entirely submitted in 1 cassette.  CM 03/27/2022  Final Diagnosis performed by Quay Burow, MD.   Electronically signed 03/31/2022 9:20:37AM The electronic signature indicates that the named Attending P athologist has evaluated the specimen Technical component performed at [place], [address] [place], [place] [address] Lab: [phone] Dir: Rush Farmer, MD, MMM  Professional component performed at [facility], [facility], [place], [place], [place] [address] Lab: [phone] Dir: Kathi Simpers, MD   Basic metabolic panel     Status: Abnormal   Collection Time: 04/10/22  9:41 AM  Result Value Ref Range   Sodium 139 135 - 145 mmol/L   Potassium 4.1 3.5 - 5.1 mmol/L   Chloride 106 98 - 111 mmol/L   CO2 24 22 - 32 mmol/L   Glucose, Bld 104 (H) 70 - 99 mg/dL    Comment: Glucose reference range applies only to samples taken after fasting for at least 8 hours.   BUN 16 6 - 20 mg/dL   Creatinine, Ser 0.65 0.44 - 1.00 mg/dL   Calcium 9.6 8.9 - 10.3 mg/dL   GFR, Estimated >60 >60 mL/min    Comment: (NOTE) Calculated using the CKD-EPI Creatinine Equation (2021)    Anion gap 9 5 - 15    Comment: Performed at [facility], [address] [place], [place] [address]    PHQ2/9:    03/19/2022   10:06 AM 03/02/2022    2:40 PM 02/05/2022    8:59 AM 11/06/2021    2:30 PM 06/04/2021    2:42 PM  Depression screen PHQ 2/9  Decreased Interest 1 0 0 1 0  Down, Depressed, Hopeless 1 0 0 1 0  PHQ - 2 Score 2 0 0  2 0  Altered sleeping 1 0 0 1 0  Tired, decreased energy 1 0 0 0 0  Change in appetite 0 0 0 0 0  Feeling  bad or failure about yourself  0 0 0 0 0  Trouble concentrating 0 0 0 0 0  Moving slowly or fidgety/restless 0 0 0 0 0  Suicidal thoughts 0 0 0 0 0  PHQ-9 Score 4 0 0 3 0  Difficult doing work/chores Somewhat difficult Not difficult at all Not difficult at all Not difficult at all     phq 9 is {gen pos [id number]}   Fall Risk:    04/01/2022    3:42 PM 03/19/2022   10:06 AM 03/02/2022    2:39 PM 02/05/2022    8:59 AM 11/06/2021    2:32 PM  Fall Risk   Falls in the past year? $RemoveBe'1 1 1 '[id number]$ 0 0  Number falls in past yr: 0 0 0 0 0  Injury with Fall? $RemoveBe'1 1 1 'huenmsLZH$ 0 0  Risk for fall due to :  History of fall(s) History of fall(s);Impaired balance/gait;Impaired mobility No Fall Risks No Fall Risks  Follow up  Education provided;Falls evaluation completed;Falls prevention discussed Education provided;Falls prevention discussed;Falls evaluation completed Falls prevention discussed Falls prevention discussed      Functional Status Survey:      Assessment & Plan  *** There are no diagnoses linked to this encounter.

## 2022-06-19 ENCOUNTER — Ambulatory Visit: Payer: Medicare Other | Admitting: Family Medicine

## 2022-07-08 ENCOUNTER — Encounter: Payer: Self-pay | Admitting: Medical

## 2022-07-08 ENCOUNTER — Ambulatory Visit: Payer: Medicare Other | Attending: Medical | Admitting: Medical

## 2022-07-08 NOTE — Progress Notes (Deleted)
Cardiology Office Note:    Date:  07/08/2022   ID:  Olivia MCCRYSTAL, DOB Jul 23, 1961, MRN [id number]  PCP:  Steele Sizer, MD  [facility] Cardiologist:  Kate Sable, MD  [facility] Electrophysiologist:  None   Referring MD: Steele Sizer, MD   Chief Complaint: ***  History of Present Illness:    [name] is a 61 y.o. female with a hx of  HFpEF, essential hypertension, hyperlipidemia, diabetes, former smoker x 15+ years, COPD, and peripheral edema who is here for follow up.  She had recently been admitted on 02/18/2022 due to facial droop.  Her symptoms resolved upon arrival to the emergency department she was diagnosed with a TIA.  She was started on aspirin and Lipitor. Echocardiogram that was done on 02/19/2022 which showed an EF of 60 to 65% with grade 2 diastolic dysfunction.  There were some concerns of TIA versus CVA and she was placed on a long-term monitor which showed no evidence of atrial fibrillation or atrial flutter to suggest etiology of CVA.  Patient triggered events associated with sinus rhythm or sinus tachycardia.  She had a minimum heart rate of 66 and maximal heart rate of 169 which was 101.  Predominant underlying rhythm was sinus rhythm.  Overall she had a benign cardiac monitor.  Last seen 05/07/22 and was feeling better. She still had exertional shortness of breath on lasix '40mg'$  daily.   Today,  BMET  Past Medical History:  Diagnosis Date   Abdominal aortic atherosclerosis ([place])    Anemia    Arthritis    bilateral knees   Bronchitis    Carpal tunnel syndrome, bilateral    Contact dermatitis and other eczema, due to unspecified cause    COPD (chronic obstructive pulmonary disease) (HCC)    DDD (degenerative disc disease)    Depressive disorder    Diverticulosis    Dysmetabolic syndrome X    Eczema    GERD (gastroesophageal reflux disease)    Hyperlipidemia    Hypertension    IBS (irritable bowel syndrome)    Insomnia    Lumbago     Nonspecific abnormal electrocardiogram (ECG) (EKG)    Osteoarthritis of both knees    Other ovarian failure(256.39)    Peptic ulcer    Postmenopausal atrophic vaginitis    Symptomatic menopausal or female climacteric states    Unspecified vitamin D deficiency     Past Surgical History:  Procedure Laterality Date   ACHILLES TENDON SURGERY Left 01/02/2022   Procedure: ACHILLES TENDON REPAIR-SECONDARY;  Surgeon: Samara Deist, DPM;  Location: [facility] ORS;  Service: Podiatry;  Laterality: Left;   BACK SURGERY     BREAST EXCISIONAL BIOPSY Left ?   neg   BREAST LUMPECTOMY Left 01/08/2021   Procedure: BREAST LUMPECTOMY;  Surgeon: Ronny Bacon, MD;  Location: [facility] ORS;  Service: General;  Laterality: Left;   CARPAL TUNNEL RELEASE Left 08/15/2018   Procedure: CARPAL TUNNEL RELEASE;  Surgeon: Earnestine Leys, MD;  Location: [facility] ORS;  Service: Orthopedics;  Laterality: Left;   CERVICAL DISCECTOMY     x 2; metal plate   CHOLECYSTECTOMY     COLONOSCOPY WITH PROPOFOL N/A 08/08/2018   Procedure: COLONOSCOPY WITH PROPOFOL;  Surgeon: Virgel Manifold, MD;  Location: [facility] ENDOSCOPY;  Service: Endoscopy;  Laterality: N/A;   COLONOSCOPY WITH PROPOFOL N/A 12/01/2021   Procedure: COLONOSCOPY WITH PROPOFOL;  Surgeon: Lin Landsman, MD;  Location: [facility] ENDOSCOPY;  Service: Gastroenterology;  Laterality: N/A;   DILATATION & CURETTAGE/HYSTEROSCOPY WITH  MYOSURE N/A 03/21/2015   Procedure: DILATATION & CURETTAGE/HYSTEROSCOPY;  Surgeon: Aletha Halim, MD;  Location: [facility] ORS;  Service: Gynecology;  Laterality: N/A;   DILATION AND CURETTAGE OF UTERUS     ENDOMETRIAL ABLATION     ESOPHAGOGASTRODUODENOSCOPY (EGD) WITH PROPOFOL N/A 04/13/2018   Procedure: ESOPHAGOGASTRODUODENOSCOPY (EGD) WITH PROPOFOL;  Surgeon: Virgel Manifold, MD;  Location: [facility] ENDOSCOPY;  Service: Endoscopy;  Laterality: N/A;   ESOPHAGOGASTRODUODENOSCOPY (EGD) WITH PROPOFOL N/A 08/08/2018   Procedure: ESOPHAGOGASTRODUODENOSCOPY (EGD)  WITH PROPOFOL;  Surgeon: Virgel Manifold, MD;  Location: [facility] ENDOSCOPY;  Service: Endoscopy;  Laterality: N/A;   ESOPHAGOGASTRODUODENOSCOPY (EGD) WITH PROPOFOL N/A 03/25/2022   Procedure: ESOPHAGOGASTRODUODENOSCOPY (EGD) WITH PROPOFOL;  Surgeon: Lin Landsman, MD;  Location: [facility] ENDOSCOPY;  Service: Gastroenterology;  Laterality: N/A;   ESOPHAGOGASTRODUODENOSCOPY (EGD) WITH PROPOFOL N/A 03/27/2022   Procedure: ESOPHAGOGASTRODUODENOSCOPY (EGD) WITH PROPOFOL;  Surgeon: Lin Landsman, MD;  Location: [facility] ENDOSCOPY;  Service: Gastroenterology;  Laterality: N/A;   JOINT REPLACEMENT     OSTECTOMY Left 01/02/2022   Procedure: CALCANEAL EXOSTECTOMY;  Surgeon: Samara Deist, DPM;  Location: [facility] ORS;  Service: Podiatry;  Laterality: Left;   SPINAL FUSION     lumbar x2   TONSILLECTOMY     TOTAL KNEE ARTHROPLASTY Left 04/12/2019   Procedure: TOTAL KNEE ARTHROPLASTY;  Surgeon: Lovell Sheehan, MD;  Location: [facility] OR[facility];  Service: Orthopedics;  Laterality: Left;   TOTAL KNEE ARTHROPLASTY Right 04/17/2020   Procedure: TOTAL KNEE ARTHROPLASTY;  Surgeon: Lovell Sheehan, MD;  Location: [facility] ORS;  Service: Orthopedics;  Laterality: Right;   TUBAL LIGATION      Current Medications: No outpatient medications have been marked as taking for the 07/08/22 encounter (Appointment) with Kathlen Mody, Ciclaly Mulcahey H, PA-C.     Allergies:   Patient has no known allergies.   Social History   Socioeconomic History   Marital status: Married    Spouse name: [name]   Number of children: 4   Years of education: Not on file   Highest education level: 12th grade  Occupational History   Occupation: disabled    Comment: chronic back pain   Tobacco Use   Smoking status: Former    Packs/day: 1.00    Years: 20.00    Total pack years: 20.00    Types: Cigarettes    Start date: 07/26/2002    Quit date: 04/06/2019    Years since quitting: 3.2   Smokeless tobacco: Never   Tobacco comments:    Quit Date 04/06/2019  Vaping  Use   Vaping Use: Never used  Substance and Sexual Activity   Alcohol use: Yes    Comment: occasional beer   Drug use: No   Sexual activity: Yes    Partners: Male    Birth control/protection: Other-see comments    Comment: Ablation  Other Topics Concern   Not on file  Social History Narrative   Not on file   Social Determinants of Health   Financial Resource Strain: Low Risk  (11/06/2021)   Overall Financial Resource Strain (CARDIA)    Difficulty of Paying Living Expenses: Not hard at all  Food Insecurity: No Food Insecurity (04/01/2022)   Hunger Vital Sign    Worried About Running Out of Food in the Last Year: Never true    Ran Out of Food in the Last Year: Never true  Transportation Needs: No Transportation Needs (04/01/2022)   PRAPARE - Hydrologist (Medical): No    Lack of Transportation (Non-Medical):  No  Physical Activity: Inactive (11/06/2021)   Exercise Vital Sign    Days of Exercise per Week: 0 days    Minutes of Exercise per Session: 0 min  Stress: No Stress Concern Present (11/06/2021)   [place]    Feeling of Stress : Not at all  Social Connections: Moderately Isolated (11/06/2021)   Social Connection and Isolation Panel [NHANES]    Frequency of Communication with Friends and Family: More than three times a week    Frequency of Social Gatherings with Friends and Family: Three times a week    Attends Religious Services: Never    Active Member of Clubs or Organizations: No    Attends Archivist Meetings: Never    Marital Status: Married     Family History: The patient's family history includes Asthma in her daughter; Breast cancer in her maternal grandmother; Congestive Heart Failure in her mother; Depression in her daughter; Diabetes in her maternal uncle; Heart disease in her mother; Heart failure in her maternal grandfather and maternal uncle; Hypertension in her  father; Kidney disease in her maternal uncle; Thyroid cancer in her mother.  ROS:   Please see the history of present illness.     All other systems reviewed and are negative.  EKGs/Labs/Other Studies Reviewed:    The following studies were reviewed today:  Echo 01/2022  1. Left ventricular ejection fraction, by estimation, is 60 to 65%. The  left ventricle has normal function. The left ventricle has no regional  wall motion abnormalities. Left ventricular diastolic parameters are  consistent with Grade II diastolic  dysfunction (pseudonormalization).   2. Right ventricular systolic function is normal. The right ventricular  size is normal. There is mildly elevated pulmonary artery systolic  pressure. The estimated right ventricular systolic pressure is 96.2 mmHg.   3. Left atrial size was mildly dilated.   4. The mitral valve is normal in structure. Mild mitral valve  regurgitation. No evidence of mitral stenosis.   5. Tricuspid valve regurgitation is mild to moderate.   6. The aortic valve is normal in structure. Aortic valve regurgitation is  not visualized. No aortic stenosis is present.   7. The inferior vena cava is normal in size with greater than 50%  respiratory variability, suggesting right atrial pressure of 3 mmHg.   Heart monitor 04/2022 Patch Wear Time:  14 days and 0 hours (2023-05-26T15:09:28-0400 to 2023-06-09T15:09:32-0400)   Patient had a min HR of 66 bpm, max HR of 160 bpm, and avg HR of 101 bpm. Predominant underlying rhythm was Sinus Rhythm. Isolated SVEs were rare (<1.0%), SVE Couplets were rare (<1.0%), and SVE Triplets were rare (<1.0%). Isolated VEs were rare (<1.0%),  and no VE Couplets or VE Triplets were present.    No evidence for atrial fibrillation or atrial flutter to suggest etiology of CVA.  Patient triggered events associated with sinus rhythm or sinus tachycardia.  Overall benign cardiac monitor.  EKG:  EKG is *** ordered today.  The ekg ordered  today demonstrates ***  Recent Labs: 02/05/2022: TSH 0.90 02/18/2022: Magnesium 1.8 02/19/2022: B Natriuretic Peptide 271.9 03/02/2022: ALT 16; Hemoglobin 12.7; Platelets 348 04/10/2022: BUN 16; Creatinine, Ser 0.65; Potassium 4.1; Sodium 139  Recent Lipid Panel    Component Value Date/Time   CHOL 120 02/19/2022 0339   TRIG 258 (H) 02/19/2022 0339   HDL 34 (L) 02/19/2022 0339   CHOLHDL 3.5 02/19/2022 0339   VLDL 52 (H) 02/19/2022 9528  [place] 34 02/19/2022 0339   LDLCALC 34 08/07/2020 1247     Risk Assessment/Calculations:   {Does this patient have ATRIAL FIBRILLATION?:[phone]}   Physical Exam:    VS:  There were no vitals taken for this visit.    Wt Readings from Last 3 Encounters:  05/07/22 205 lb (93 kg)  03/25/22 200 lb (90.7 kg)  03/23/22 204 lb (92.5 kg)     GEN: *** Well nourished, well developed in no acute distress HEENT: Normal NECK: No JVD; No carotid bruits LYMPHATICS: No lymphadenopathy CARDIAC: ***RRR, no murmurs, rubs, gallops RESPIRATORY:  Clear to auscultation without rales, wheezing or rhonchi  ABDOMEN: Soft, non-tender, non-distended MUSCULOSKELETAL:  No edema; No deformity  SKIN: Warm and dry NEUROLOGIC:  Alert and oriented x 3 PSYCHIATRIC:  Normal affect   ASSESSMENT:    No diagnosis found. PLAN:    In order of problems listed above:  HFpEF LVEF 60-65%  HTN  H/o TIA  COPD  Disposition: Follow up {follow up:15908} with ***   Shared Decision Making/Informed Consent   {Are you ordering a CV Procedure (e.g. stress test, cath, DCCV, TEE, etc)?   Press F2        :[id number]}    Signed, Maryclare Nydam Ninfa Meeker, PA-C  07/08/2022 8:20 AM     [facility]

## 2022-07-16 DIAGNOSIS — G894 Chronic pain syndrome: Secondary | ICD-10-CM | POA: Diagnosis not present

## 2022-07-16 DIAGNOSIS — Z79891 Long term (current) use of opiate analgesic: Secondary | ICD-10-CM | POA: Diagnosis not present

## 2022-07-17 DIAGNOSIS — M25511 Pain in right shoulder: Secondary | ICD-10-CM | POA: Diagnosis not present

## 2022-07-17 DIAGNOSIS — M25551 Pain in right hip: Secondary | ICD-10-CM | POA: Diagnosis not present

## 2022-07-25 ENCOUNTER — Other Ambulatory Visit: Payer: Self-pay | Admitting: Family Medicine

## 2022-07-25 DIAGNOSIS — F339 Major depressive disorder, recurrent, unspecified: Secondary | ICD-10-CM

## 2022-07-25 DIAGNOSIS — F5104 Psychophysiologic insomnia: Secondary | ICD-10-CM

## 2022-07-27 NOTE — Telephone Encounter (Signed)
Requested medications are due for refill today.  A little too soon  Requested medications are on the active medications list.  yes  Last refill. 02/05/2022 #90 1 rf  Future visit scheduled.   yes  Notes to clinic.  Medication refill is not delegated.    Requested Prescriptions  Pending Prescriptions Disp Refills   QUEtiapine (SEROQUEL) 25 MG tablet [Pharmacy Med Name: QUETIAPINE 25MG  TABLETS] 90 tablet 1    Sig: TAKE 1 TABLET(25 MG) BY MOUTH AT BEDTIME     Not Delegated - Psychiatry:  Antipsychotics - Second Generation (Atypical) - quetiapine Failed - 07/25/2022  6:27 AM      Failed - This refill cannot be delegated      Failed - Lipid Panel in normal range within the last 12 months    Cholesterol  Date Value Ref Range Status  02/19/2022 120 0 - 200 mg/dL Final   LDL Cholesterol (Calc)  Date Value Ref Range Status  08/07/2020 34 mg/dL (calc) Final    Comment:    Reference range: <100 . Desirable range <100 mg/dL for primary prevention;   <70 mg/dL for patients with CHD or diabetic patients  with > or = 2 CHD risk factors. 10/07/2020 LDL-C is now calculated using the Martin-Hopkins  calculation, which is a validated novel method providing  better accuracy than the Friedewald equation in the  estimation of LDL-C.  Marland Kitchen et al. Horald Pollen. Lenox Ahr): 2061-2068  (http://education.[employer].com/faq/FAQ164)    LDL Cholesterol  Date Value Ref Range Status  02/19/2022 34 0 - 99 mg/dL Final    Comment:           Total Cholesterol/HDL:CHD Risk Coronary Heart Disease Risk Table                     Men   Women  1/2 Average Risk   3.4   3.3  Average Risk       5.0   4.4  2 X Average Risk   9.6   7.1  3 X Average Risk  23.4   11.0        Use the calculated Patient Ratio above and the CHD Risk Table to determine the patient's CHD Risk.        ATP III CLASSIFICATION (LDL):  <100     mg/dL   Optimal  02/21/2022  mg/dL   Near or Above                    Optimal  130-159  mg/dL    Borderline  561-537  mg/dL   High  943-276     mg/dL   Very High Performed at [facility], [address] [place], [place]    HDL  Date Value Ref Range Status  02/19/2022 34 (L) >40 mg/dL Final   Triglycerides  Date Value Ref Range Status  02/19/2022 258 (H) <150 mg/dL Final         Passed - TSH in normal range and within 360 days    TSH  Date Value Ref Range Status  02/05/2022 0.90 0.40 - 4.50 mIU/L Final         Passed - Completed PHQ-2 or PHQ-9 in the last 360 days      Passed - Last BP in normal range    BP Readings from Last 1 Encounters:  05/07/22 130/80         Passed - Last Heart Rate in normal range  Pulse Readings from Last 1 Encounters:  05/07/22 99         Passed - Valid encounter within last 6 months    Recent Outpatient Visits           4 months ago Diastolic heart failure, unspecified HF chronicity [facility])   [facility] Steele Sizer, MD   4 months ago TIA (transient ischemic attack)   [facility] Delsa Grana, PA-C   5 months ago Acute nonintractable headache, unspecified headache type   [facility] Delsa Grana, PA-C   1 year ago Need for vaccination for pneumococcus   [facility] Steele Sizer, MD   1 year ago Lipoma of anterior chest wall   [facility] Steele Sizer, MD       Future Appointments             In 2 weeks Kathlen Mody, Cadence H, PA-C [place]   In 3 months  [place] within normal limits and completed in the last 12 months    WBC  Date Value Ref Range Status  03/02/2022 9.2 3.8 - 10.8 Thousand/uL Final   RBC  Date Value Ref Range Status  03/02/2022 4.19 3.80 - 5.10 Million/uL Final   Hemoglobin  Date Value Ref Range Status  03/02/2022 12.7 11.7 - 15.5 g/dL Final   HCT  Date Value Ref  Range Status  03/02/2022 38.7 35.0 - 45.0 % Final   MCHC  Date Value Ref Range Status  03/02/2022 32.8 32.0 - 36.0 g/dL Final   [facility]  Date Value Ref Range Status  03/02/2022 30.3 27.0 - 33.0 pg Final   MCV  Date Value Ref Range Status  03/02/2022 92.4 80.0 - 100.0 fL Final   No results found for: "PLTCOUNTKUC", "LABPLAT", "POCPLA" RDW  Date Value Ref Range Status  03/02/2022 12.3 11.0 - 15.0 % Final         Passed - CMP within normal limits and completed in the last 12 months    Albumin  Date Value Ref Range Status  02/18/2022 3.4 (L) 3.5 - 5.0 g/dL Final   Alkaline Phosphatase  Date Value Ref Range Status  02/18/2022 82 38 - 126 U/L Final   Alkaline phosphatase (APISO)  Date Value Ref Range Status  03/02/2022 112 37 - 153 U/L Final   ALT  Date Value Ref Range Status  03/02/2022 16 6 - 29 U/L Final   AST  Date Value Ref Range Status  03/02/2022 19 10 - 35 U/L Final   BUN  Date Value Ref Range Status  04/10/2022 16 6 - 20 mg/dL Final   Calcium  Date Value Ref Range Status  04/10/2022 9.6 8.9 - 10.3 mg/dL Final   CO2  Date Value Ref Range Status  04/10/2022 24 22 - 32 mmol/L Final   Creat  Date Value Ref Range Status  03/02/2022 0.63 0.50 - 1.03 mg/dL Final   Creatinine, Ser  Date Value Ref Range Status  04/10/2022 0.65 0.44 - 1.00 mg/dL Final   Glucose, Bld  Date Value Ref Range Status  04/10/2022 104 (H) 70 - 99 mg/dL Final    Comment:    Glucose reference range applies only to samples taken after fasting for at least 8 hours.   Glucose-Capillary  Date Value Ref  Range Status  02/18/2022 121 (H) 70 - 99 mg/dL Final    Comment:    Glucose reference range applies only to samples taken after fasting for at least 8 hours.   Potassium  Date Value Ref Range Status  04/10/2022 4.1 3.5 - 5.1 mmol/L Final   Sodium  Date Value Ref Range Status  04/10/2022 139 135 - 145 mmol/L Final   Total Bilirubin  Date Value Ref Range Status  03/02/2022 0.5  0.2 - 1.2 mg/dL Final   Protein, ur  Date Value Ref Range Status  01/01/2021 NEGATIVE NEGATIVE mg/dL Final   Protein, UA  Date Value Ref Range Status  02/05/2022 Positive (A) Negative Final    Comment:    100+   Total Protein  Date Value Ref Range Status  03/02/2022 7.5 6.1 - 8.1 g/dL Final   GFR, Est African American  Date Value Ref Range Status  08/07/2020 118 > OR = 60 mL/min/1.25m2 Final   eGFR  Date Value Ref Range Status  03/02/2022 102 > OR = 60 mL/min/1.50m2 Final    Comment:    The eGFR is based on the CKD-EPI 2021 equation. To calculate  the new eGFR from a previous Creatinine or Cystatin C result, go to https://www.kidney.org/professionals/ kdoqi/gfr%5Fcalculator    GFR, Est Non African American  Date Value Ref Range Status  08/07/2020 102 > OR = 60 mL/min/1.60m2 Final   GFR, Estimated  Date Value Ref Range Status  04/10/2022 >60 >60 mL/min Final    Comment:    (NOTE) Calculated using the CKD-EPI Creatinine Equation (2021)

## 2022-08-11 ENCOUNTER — Encounter: Payer: Self-pay | Admitting: Medical

## 2022-08-11 ENCOUNTER — Ambulatory Visit: Payer: Medicare Other | Attending: Medical | Admitting: Medical

## 2022-08-11 VITALS — BP 140/86 | HR 69 | Ht 68.0 in | Wt 211.0 lb

## 2022-08-11 DIAGNOSIS — I10 Essential (primary) hypertension: Secondary | ICD-10-CM | POA: Diagnosis not present

## 2022-08-11 DIAGNOSIS — I5032 Chronic diastolic (congestive) heart failure: Secondary | ICD-10-CM | POA: Diagnosis not present

## 2022-08-11 DIAGNOSIS — Z79899 Other long term (current) drug therapy: Secondary | ICD-10-CM | POA: Diagnosis not present

## 2022-08-11 DIAGNOSIS — G459 Transient cerebral ischemic attack, unspecified: Secondary | ICD-10-CM | POA: Insufficient documentation

## 2022-08-11 DIAGNOSIS — J449 Chronic obstructive pulmonary disease, unspecified: Secondary | ICD-10-CM | POA: Insufficient documentation

## 2022-08-11 MED ORDER — SPIRONOLACTONE 25 MG PO TABS: 12.5000 mg | ORAL_TABLET | Freq: Every day | ORAL | 1 refills | Status: DC

## 2022-08-11 NOTE — Progress Notes (Signed)
Cardiology Office Note:    Date:  08/11/2022   ID:  [name], DOB 04-12-1961, MRN [id number]  PCP:  Steele Sizer, MD  [facility] Cardiologist:  Kate Sable, MD  [facility] Electrophysiologist:  None   Referring MD: Steele Sizer, MD   Chief Complaint: 2 month follow-up  History of Present Illness:    [name] is a 61 y.o. female with a hx of HFpEF, essential hypertension, hyperlipidemia, diabetes, former smoker x 15+ years, COPD, and peripheral edema who is here for follow up on her HFpEF.   She was admitted on 02/18/2022 due to facial droop.  Her symptoms resolved upon arrival to the emergency department she was diagnosed with a TIA.  At that time she was started on aspirin and Lipitor.  She started having symptoms of peripheral edema that she stated was ongoing for about a year but denied any palpitations, shortness of breath or chest pain.  She did also endorse some occasional orthopnea.  She had an echocardiogram that was done on 02/19/2022 which showed an EF of 60 to 65% with grade 2 diastolic dysfunction.  There were some concerns of TIA versus CVA and she was placed on a long-term monitor which showed no evidence of atrial fibrillation or atrial flutter to suggest etiology of CVA.  Patient triggered events associated with sinus rhythm or sinus tachycardia.  She had a minimum heart rate of 66 and maximal heart rate of 169 which was 101.  Predominant underlying rhythm was sinus rhythm.  Overall she had a benign cardiac monitor.  She was last seen 05/07/2022 and was overall doing better.  She had an endoscopy and colonoscopy that showed no issues.  Reported exertional dyspnea and mild lower leg edema.  Was recommended she decrease her salt intake.  Today, the patient reports she is overall doing well. Noted she put on some weight. She has occasional heartburn. Breathing is good, can be short when walking. She has occasional swelling on her feet. She is not  taking lasix daily. BP is mildly elevated, she did have her medications this morning.   Past Medical History:  Diagnosis Date   Abdominal aortic atherosclerosis (HCC)    Anemia    Arthritis    bilateral knees   Bronchitis    Carpal tunnel syndrome, bilateral    Contact dermatitis and other eczema, due to unspecified cause    COPD (chronic obstructive pulmonary disease) (HCC)    DDD (degenerative disc disease)    Depressive disorder    Diverticulosis    Dysmetabolic syndrome X    Eczema    GERD (gastroesophageal reflux disease)    Hyperlipidemia    Hypertension    IBS (irritable bowel syndrome)    Insomnia    Lumbago    Nonspecific abnormal electrocardiogram (ECG) (EKG)    Osteoarthritis of both knees    Other ovarian failure(256.39)    Peptic ulcer    Postmenopausal atrophic vaginitis    Symptomatic menopausal or female climacteric states    Unspecified vitamin D deficiency     Past Surgical History:  Procedure Laterality Date   ACHILLES TENDON SURGERY Left 01/02/2022   Procedure: ACHILLES TENDON REPAIR-SECONDARY;  Surgeon: Samara Deist, DPM;  Location: [facility] ORS;  Service: Podiatry;  Laterality: Left;   BACK SURGERY     BREAST EXCISIONAL BIOPSY Left ?   neg   BREAST LUMPECTOMY Left 01/08/2021   Procedure: BREAST LUMPECTOMY;  Surgeon: Ronny Bacon, MD;  Location: [facility] ORS;  Service:  General;  Laterality: Left;   CARPAL TUNNEL RELEASE Left 08/15/2018   Procedure: CARPAL TUNNEL RELEASE;  Surgeon: Earnestine Leys, MD;  Location: [facility] ORS;  Service: Orthopedics;  Laterality: Left;   CERVICAL DISCECTOMY     x 2; metal plate   CHOLECYSTECTOMY     COLONOSCOPY WITH PROPOFOL N/A 08/08/2018   Procedure: COLONOSCOPY WITH PROPOFOL;  Surgeon: Virgel Manifold, MD;  Location: [facility] ENDOSCOPY;  Service: Endoscopy;  Laterality: N/A;   COLONOSCOPY WITH PROPOFOL N/A 12/01/2021   Procedure: COLONOSCOPY WITH PROPOFOL;  Surgeon: Lin Landsman, MD;  Location: [facility] ENDOSCOPY;   Service: Gastroenterology;  Laterality: N/A;   DILATATION & CURETTAGE/HYSTEROSCOPY WITH MYOSURE N/A 03/21/2015   Procedure: DILATATION & CURETTAGE/HYSTEROSCOPY;  Surgeon: Aletha Halim, MD;  Location: [facility] ORS;  Service: Gynecology;  Laterality: N/A;   DILATION AND CURETTAGE OF UTERUS     ENDOMETRIAL ABLATION     ESOPHAGOGASTRODUODENOSCOPY (EGD) WITH PROPOFOL N/A 04/13/2018   Procedure: ESOPHAGOGASTRODUODENOSCOPY (EGD) WITH PROPOFOL;  Surgeon: Virgel Manifold, MD;  Location: [facility] ENDOSCOPY;  Service: Endoscopy;  Laterality: N/A;   ESOPHAGOGASTRODUODENOSCOPY (EGD) WITH PROPOFOL N/A 08/08/2018   Procedure: ESOPHAGOGASTRODUODENOSCOPY (EGD) WITH PROPOFOL;  Surgeon: Virgel Manifold, MD;  Location: [facility] ENDOSCOPY;  Service: Endoscopy;  Laterality: N/A;   ESOPHAGOGASTRODUODENOSCOPY (EGD) WITH PROPOFOL N/A 03/25/2022   Procedure: ESOPHAGOGASTRODUODENOSCOPY (EGD) WITH PROPOFOL;  Surgeon: Lin Landsman, MD;  Location: [facility] ENDOSCOPY;  Service: Gastroenterology;  Laterality: N/A;   ESOPHAGOGASTRODUODENOSCOPY (EGD) WITH PROPOFOL N/A 03/27/2022   Procedure: ESOPHAGOGASTRODUODENOSCOPY (EGD) WITH PROPOFOL;  Surgeon: Lin Landsman, MD;  Location: [facility] END[facility]COPY;  Service: Gastroenterology;  Laterality: N/A;   JOINT REPLACEMENT     OSTECTOMY Left 01/02/2022   Procedure: CALCANEAL EXOSTECTOMY;  Surgeon: Samara Deist, DPM;  Location: [facility] ORS;  Service: Podiatry;  Laterality: Left;   SPINAL FUSION     lumbar x2   TONSILLECTOMY     TOTAL KNEE ARTHROPLASTY Left 04/12/2019   Procedure: TOTAL KNEE ARTHROPLASTY;  Surgeon: Lovell Sheehan, MD;  Location: [facility] ORS;  Service: Orthopedics;  Laterality: Left;   TOTAL KNEE ARTHROPLASTY Right 04/17/2020   Procedure: TOTAL KNEE ARTHROPLASTY;  Surgeon: Lovell Sheehan, MD;  Location: [facility] ORS;  Service: Orthopedics;  Laterality: Right;   TUBAL LIGATION      Current Medications: Current Meds  Medication Sig   amitriptyline (ELAVIL) 10 MG tablet Take 1  tablet (10 mg total) by mouth at bedtime.   amLODipine (NORVASC) 5 MG tablet TAKE 1 TABLET BY MOUTH ONCE DAILY   aspirin EC 81 MG EC tablet Take 1 tablet (81 mg total) by mouth daily. Swallow whole.   atorvastatin (LIPITOR) 40 MG tablet Take 1 tablet (40 mg total) by mouth at bedtime.   cholecalciferol (VITAMIN D) 1000 units tablet Take 1,000 Units by mouth daily.   Cyanocobalamin (B-12) 1000 MCG SUBL Place 1 each under the tongue 3 (three) times a week.   desvenlafaxine (PRISTIQ) 50 MG 24 hr tablet Take 1 tablet (50 mg total) by mouth daily.   famotidine (PEPCID) 20 MG tablet TAKE 1 TABLET(20 MG) BY MOUTH TWICE DAILY AS NEEDED FOR HEARTBURN OR INDIGESTION   gabapentin (NEURONTIN) 300 MG capsule Take 300 mg by mouth every 6 (six) hours as needed (Nerve pain).   losartan-hydrochlorothiazide (HYZAAR) 100-25 MG tablet TAKE 1 TABLET BY MOUTH EVERY DAY   metFORMIN (GLUCOPHAGE) 500 MG tablet Take 1 tablet (500 mg total) by mouth daily with breakfast.   Multiple Vitamins-Minerals (MULTIVITAMIN WITH MINERALS) tablet Take 1 tablet by  mouth every other day.   NARCAN 4 MG/0.1ML LIQD nasal spray kit Place 1 spray into the nose once.   Oxycodone HCl 20 MG TABS Take 20 mg by mouth every 4 (four) hours as needed (pain).   pantoprazole (PROTONIX) 40 MG tablet Take 1 tablet (40 mg total) by mouth 2 (two) times daily before a meal.   QUEtiapine (SEROQUEL) 25 MG tablet Take 1 tablet (25 mg total) by mouth at bedtime.   spironolactone (ALDACTONE) 25 MG tablet Take 0.5 tablets (12.5 mg total) by mouth daily.   tiZANidine (ZANAFLEX) 4 MG tablet Take 4 mg by mouth every 8 (eight) hours.   valACYclovir (VALTREX) 1000 MG tablet Take 1,000 mg by mouth 2 (two) times daily as needed.     Allergies:   Patient has no known allergies.   Social History   Socioeconomic History   Marital status: Married    Spouse name: Mikeal Hawthorne   Number of children: 4   Years of education: Not on file   Highest education level: 12th grade   Occupational History   Occupation: disabled    Comment: chronic back pain   Tobacco Use   Smoking status: Former    Packs/day: 1.00    Years: 20.00    Total pack years: 20.00    Types: Cigarettes    Start date: 07/26/2002    Quit date: 04/06/2019    Years since quitting: 3.3   Smokeless tobacco: Never   Tobacco comments:    Quit Date 04/06/2019  Vaping Use   Vaping Use: Never used  Substance and Sexual Activity   Alcohol use: Yes    Comment: occasional beer   Drug use: No   Sexual activity: Yes    Partners: Male    Birth control/protection: Other-see comments    Comment: Ablation  Other Topics Concern   Not on file  Social History Narrative   Not on file   Social Determinants of Health   Financial Resource Strain: Low Risk  (11/06/2021)   Overall Financial Resource Strain (CARDIA)    Difficulty of Paying Living Expenses: Not hard at all  Food Insecurity: No Food Insecurity (04/01/2022)   Hunger Vital Sign    Worried About Running Out of Food in the Last Year: Never true    Ran Out of Food in the Last Year: Never true  Transportation Needs: No Transportation Needs (04/01/2022)   PRAPARE - Hydrologist (Medical): No    Lack of Transportation (Non-Medical): No  Physical Activity: Inactive (11/06/2021)   Exercise Vital Sign    Days of Exercise per Week: 0 days    Minutes of Exercise per Session: 0 min  Stress: No Stress Concern Present (11/06/2021)   [place]    Feeling of Stress : Not at all  Social Connections: Moderately Isolated (11/06/2021)   Social Connection and Isolation Panel [NHANES]    Frequency of Communication with Friends and Family: More than three times a week    Frequency of Social Gatherings with Friends and Family: Three times a week    Attends Religious Services: Never    Active Member of Clubs or Organizations: No    Attends Archivist Meetings: Never     Marital Status: Married     Family History: The patient's family history includes Asthma in her daughter; Breast cancer in her maternal grandmother; Congestive Heart Failure in her mother; Depression in her daughter; Diabetes  in her maternal uncle; Heart disease in her mother; Heart failure in her maternal grandfather and maternal uncle; Hypertension in her father; Kidney disease in her maternal uncle; Thyroid cancer in her mother.  ROS:   Please see the history of present illness.     All other systems reviewed and are negative.  EKGs/Labs/Other Studies Reviewed:    The following studies were reviewed today:  Heart monitor 04/2022 Patch Wear Time:  14 days and 0 hours (2023-05-26T15:09:28-0400 to 2023-06-09T15:09:32-0400)   Patient had a min HR of 66 bpm, max HR of 160 bpm, and avg HR of 101 bpm. Predominant underlying rhythm was Sinus Rhythm. Isolated SVEs were rare (<1.0%), SVE Couplets were rare (<1.0%), and SVE Triplets were rare (<1.0%). Isolated VEs were rare (<1.0%),  and no VE Couplets or VE Triplets were present.    No evidence for atrial fibrillation or atrial flutter to suggest etiology of CVA.  Patient triggered events associated with sinus rhythm or sinus tachycardia.  Overall benign cardiac monitor.  Echo 01/2022 1. Left ventricular ejection fraction, by estimation, is 60 to 65%. The  left ventricle has normal function. The left ventricle has no regional  wall motion abnormalities. Left ventricular diastolic parameters are  consistent with Grade II diastolic  dysfunction (pseudonormalization).   2. Right ventricular systolic function is normal. The right ventricular  size is normal. There is mildly elevated pulmonary artery systolic  pressure. The estimated right ventricular systolic pressure is 25.0 mmHg.   3. Left atrial size was mildly dilated.   4. The mitral valve is normal in structure. Mild mitral valve  regurgitation. No evidence of mitral stenosis.   5.  Tricuspid valve regurgitation is mild to moderate.   6. The aortic valve is normal in structure. Aortic valve regurgitation is  not visualized. No aortic stenosis is present.   7. The inferior vena cava is normal in size with greater than 50%  respiratory variability, suggesting right atrial pressure of 3 mmHg.   EKG:  EKG is not ordered today.   Recent Labs: 02/05/2022: TSH 0.90 02/18/2022: Magnesium 1.8 02/19/2022: B Natriuretic Peptide 271.9 03/02/2022: ALT 16; Hemoglobin 12.7; Platelets 348 04/10/2022: BUN 16; Creatinine, Ser 0.65; Potassium 4.1; Sodium 139  Recent Lipid Panel    Component Value Date/Time   CHOL 120 02/19/2022 0339   TRIG 258 (H) 02/19/2022 0339   HDL 34 (L) 02/19/2022 0339   CHOLHDL 3.5 02/19/2022 0339   VLDL 52 (H) 02/19/2022 0339   LDLCALC 34 02/19/2022 0339   LDLCALC 34 08/07/2020 1247    Physical Exam:    VS:  BP (!) 140/86 (BP Location: Left Arm, Patient Position: Sitting, Cuff Size: Large)   Pulse 69   Ht $R'5\' 8"'sK$  (1.727 m)   Wt 211 lb (95.7 kg)   SpO2 94%   BMI 32.08 kg/m     Wt Readings from Last 3 Encounters:  08/11/22 211 lb (95.7 kg)  05/07/22 205 lb (93 kg)  03/25/22 200 lb (90.7 kg)     GEN:  Well nourished, well developed in no acute distress HEENT: Normal NECK: No JVD; No carotid bruits LYMPHATICS: No lymphadenopathy CARDIAC: RRR, no murmurs, rubs, gallops RESPIRATORY:  Clear to auscultation without rales, wheezing or rhonchi  ABDOMEN: Soft, non-tender, non-distended MUSCULOSKELETAL:  No edema; No deformity  SKIN: Warm and dry NEUROLOGIC:  Alert and oriented x 3 PSYCHIATRIC:  Normal affect   ASSESSMENT:    1. Chronic diastolic heart failure ([place])   2. Medication management  3. Essential hypertension   4. TIA (transient ischemic attack)   5. Chronic obstructive pulmonary disease, unspecified COPD type ([place])    PLAN:    In order of problems listed above:  HFpEF  Echo 01/2022 showed LVEF 60-65%, G2DD. Patient reports occasional  lower leg edema and DOE. She is euvolemic on exam today. Continue Losartan-HCTZ 100-25mg  daily. I will start spironolactone 12.5mg  daily, BMET in 2 weeks. CHF education discussed. Consider adding BB at follow-up.   HTN BP is mildly elevated. Add on spironolactone as above. Continue Hyzaar and amlodipine.   H/o TIA Prior benign cardiac monitor with no afib/flutter. Continue Aspirin and Lipitor.   COPD Stable with no exertional symptoms.   Disposition: Follow up in 4 month(s) with MD/APP    Signed, Helyn Schwan Ninfa Meeker, PA-C  08/11/2022 9:45 AM    [facility]

## 2022-08-11 NOTE — Patient Instructions (Signed)
Medication Instructions:  Your physician has recommended you make the following change in your medication:  START: Spirolactone 12.'5mg'$  daily.  *If you need a refill on your cardiac medications before your next appointment, please call your pharmacy*   Lab Work: Your physician recommends that you return to have the following lab in 2 weeks: BMET  If you have labs (blood work) drawn today and your tests are completely normal, you will receive your results only by: [place] (if you have MyChart) OR A paper copy in the mail If you have any lab test that is abnormal or we need to change your treatment, we will call you to review the results.   Testing/Procedures: NONE ordered at this time of appointment   Follow-Up: At [facility], you and your health needs are our priority.  As part of our continuing mission to provide you with exceptional heart care, we have created designated Provider Care Teams.  These Care Teams include your primary Cardiologist (physician) and Advanced Practice Providers (APPs -  Physician Assistants and Nurse Practitioners) who all work together to provide you with the care you need, when you need it.  We recommend signing up for the patient portal called "MyChart".  Sign up information is provided on this After Visit Summary.  MyChart is used to connect with patients for Virtual Visits (Telemedicine).  Patients are able to view lab/test results, encounter notes, upcoming appointments, etc.  Non-urgent messages can be sent to your provider as well.   To learn more about what you can do with MyChart, go to [email or web address].    Your next appointment:   4 month(s)  The format for your next appointment:   In Person  Provider:   Cadence Kathlen Mody, PA-C     Important Information About Sugar

## 2022-08-18 DIAGNOSIS — Z79891 Long term (current) use of opiate analgesic: Secondary | ICD-10-CM | POA: Diagnosis not present

## 2022-08-18 DIAGNOSIS — M170 Bilateral primary osteoarthritis of knee: Secondary | ICD-10-CM | POA: Diagnosis not present

## 2022-08-18 DIAGNOSIS — M25511 Pain in right shoulder: Secondary | ICD-10-CM | POA: Diagnosis not present

## 2022-08-18 DIAGNOSIS — M5432 Sciatica, left side: Secondary | ICD-10-CM | POA: Diagnosis not present

## 2022-08-18 DIAGNOSIS — G894 Chronic pain syndrome: Secondary | ICD-10-CM | POA: Diagnosis not present

## 2022-08-18 DIAGNOSIS — M25512 Pain in left shoulder: Secondary | ICD-10-CM | POA: Diagnosis not present

## 2022-08-18 DIAGNOSIS — M545 Low back pain, unspecified: Secondary | ICD-10-CM | POA: Diagnosis not present

## 2022-08-18 DIAGNOSIS — M542 Cervicalgia: Secondary | ICD-10-CM | POA: Diagnosis not present

## 2022-08-18 DIAGNOSIS — M79661 Pain in right lower leg: Secondary | ICD-10-CM | POA: Diagnosis not present

## 2022-08-18 DIAGNOSIS — M25552 Pain in left hip: Secondary | ICD-10-CM | POA: Diagnosis not present

## 2022-08-18 DIAGNOSIS — M79662 Pain in left lower leg: Secondary | ICD-10-CM | POA: Diagnosis not present

## 2022-08-18 DIAGNOSIS — M25551 Pain in right hip: Secondary | ICD-10-CM | POA: Diagnosis not present

## 2022-08-18 DIAGNOSIS — M5136 Other intervertebral disc degeneration, lumbar region: Secondary | ICD-10-CM | POA: Diagnosis not present

## 2022-09-04 ENCOUNTER — Other Ambulatory Visit: Payer: Self-pay

## 2022-09-04 DIAGNOSIS — R110 Nausea: Secondary | ICD-10-CM

## 2022-09-04 MED ORDER — FAMOTIDINE 20 MG PO TABS: ORAL_TABLET | ORAL | 0 refills | Status: DC

## 2022-09-10 ENCOUNTER — Other Ambulatory Visit: Payer: Self-pay | Admitting: Family Medicine

## 2022-09-10 DIAGNOSIS — F339 Major depressive disorder, recurrent, unspecified: Secondary | ICD-10-CM

## 2022-09-10 NOTE — Telephone Encounter (Signed)
Pt is scheduled for tomorrow 

## 2022-09-10 NOTE — Progress Notes (Deleted)
Name: [name]   MRN: [id number]    DOB: 1960/11/27   Date:09/10/2022       Progress Note  Subjective  Chief Complaint  Follow Up  HPI  Metabolic syndrome:  She denies polyphagia, polydipsia or polyuria.  Continue life style modification, she is taking Metformin occasionally only . Last hgbA1C was normal.    HTN: bp today is at goal, she has noticed intermittent epigastric pain that is described as something sitting on her chest, going to see cardiologist and will have EGD done also    Depression Major: long history of depression, got worse when mother died in 12-28-16.  Still medications as prescribed, denies suicidal thoughts or ideation. She has been sick since April and feeling down , but wants to continue current regiment    COPD: She quit smoking 04/2019   She denies any wheezing , she has a dry cough. She has SOB with mild activity now but that is new - likely from CHF.    IBS:she still has intermittent diarrhea and constipation. She states symptoms controlled with medication    GERD: going to see Dr. Marius Ditch for EGD, she has intermittent pain on epigastric area    Atherosclerosis of aorta: she has been taking Atorvastatin now , denies side effects   CHF diastolic and pulmonary hypertension: found on Echo done 01/2022 , going to see Dr. Mylo Red next week. She has orthopnea and uses two pillows going on for months but no  lower extremity edema.   Left achilles tendinitis: had surgery on 05/16, off the boot and wearing regular shoes now, pain has improved  History of TIA: she went to [facility] on 02/18/2021 due to left facial droop and fatigue. Evaluation was negative, found to have anemia   Anemia acute: found during hospital stay in April, levels normalized but she will follow up for EGD next week.   B12 deficiency: discussed SL form to take at least 3 times a week.   Chronic pain: going to the pain clinic and takes medications as prescribed  Fever blisters: takes Valtrex about  once a month   Patient Active Problem List   Diagnosis Date Noted   Chronic GERD    Gastric erosion    Pulmonary hypertension ([place]) 84/16/6063   Diastolic heart failure ([place]) 03/19/2022   Other abnormalities of gait and mobility 03/02/2022   Obesity (BMI 30-39.9) 02/19/2022   Prediabetes 02/19/2022   Sinus bradycardia 02/19/2022   TIA (transient ischemic attack) 02/18/2022   Chronic pain 02/18/2022   Prolonged QT syndrome 02/05/2022   Status post left breast lumpectomy 01/23/2021   History of total knee arthroplasty, right 04/17/2020   S/P TKR (total knee replacement) using cement, left 04/12/2019   Benign neoplasm of ascending colon    Diverticulosis of large intestine without diverticulitis    Pancreatic lesion 04/21/2018   Abdominal aortic atherosclerosis ([place]) 04/21/2018   Acute peptic ulcer of stomach    Trigger thumb of right hand 02/25/2018   Carpal tunnel syndrome on both sides 10/21/2017   CRP elevated 08/09/2017   Elevated C-reactive protein 07/28/2017   Osteoarthritis of both knees 02/19/2016   COPD, mild ([place]) 05/08/2015   Vitamin D deficiency 01/60/1093   Metabolic syndrome 23/55/7322   Eczema 05/07/2015   Chronic insomnia 05/07/2015   Hypertension, benign 05/07/2015   Dyslipidemia 05/07/2015   Hyperglycemia 05/07/2015   Chronic low back pain 05/07/2015   Irritable bowel syndrome with both constipation and diarrhea 05/07/2015   Gastroesophageal reflux disease  without esophagitis 05/07/2015   Major depression, recurrent, chronic ([place]) 05/07/2015   Menopausal syndrome (hot flashes) 05/07/2015   History of postmenopausal bleeding 01/14/2015    Past Surgical History:  Procedure Laterality Date   ACHILLES TENDON SURGERY Left 01/02/2022   Procedure: ACHILLES TENDON REPAIR-SECONDARY;  Surgeon: Samara Deist, DPM;  Location: [facility];  Service: Podiatry;  Laterality: Left;   BACK SURGERY     BREAST EXCISIONAL BIOPSY Left ?   neg   BREAST LUMPECTOMY Left 01/08/2021    Procedure: BREAST LUMPECTOMY;  Surgeon: Ronny Bacon, MD;  Location: [facility];  Service: General;  Laterality: Left;   CARPAL TUNNEL RELEASE Left 08/15/2018   Procedure: CARPAL TUNNEL RELEASE;  Surgeon: Earnestine Leys, MD;  Location: [facility];  Service: Orthopedics;  Laterality: Left;   CERVICAL DISCECTOMY     x 2; metal plate   CHOLECYSTECTOMY     COLONOSCOPY WITH PROPOFOL N/A 08/08/2018   Procedure: COLONOSCOPY WITH PROPOFOL;  Surgeon: Virgel Manifold, MD;  Location: [facility];  Service: Endoscopy;  Laterality: N/A;   COLONOSCOPY WITH PROPOFOL N/A 12/01/2021   Procedure: COLONOSCOPY WITH PROPOFOL;  Surgeon: Lin Landsman, MD;  Location: [facility];  Service: Gastroenterology;  Laterality: N/A;   DILATATION & CURETTAGE/HYSTEROSCOPY WITH MYOSURE N/A 03/21/2015   Procedure: DILATATION & CURETTAGE/HYSTEROSCOPY;  Surgeon: Aletha Halim, MD;  Location: [facility];  Service: Gynecology;  Laterality: N/A;   DILATION AND CURETTAGE OF UTERUS     ENDOMETRIAL ABLATION     ESOPHAGOGASTRODUODENOSCOPY (EGD) WITH PROPOFOL N/A 04/13/2018   Procedure: ESOPHAGOGASTRODUODENOSCOPY (EGD) WITH PROPOFOL;  Surgeon: Virgel Manifold, MD;  Location: [facility];  Service: Endoscopy;  Laterality: N/A;   ESOPHAGOGASTRODUODENOSCOPY (EGD) WITH PROPOFOL N/A 08/08/2018   Procedure: ESOPHAGOGASTRODUODENOSCOPY (EGD) WITH PROPOFOL;  Surgeon: Virgel Manifold, MD;  Location: [facility];  Service: Endoscopy;  Laterality: N/A;   ESOPHAGOGASTRODUODENOSCOPY (EGD) WITH PROPOFOL N/A 03/25/2022   Procedure: ESOPHAGOGASTRODUODENOSCOPY (EGD) WITH PROPOFOL;  Surgeon: Lin Landsman, MD;  Location: [facility] ENDOSCOPY;  Service: Gastroenterology;  Laterality: N/A;   ESOPHAGOGASTRODUODENOSCOPY (EGD) WITH PROPOFOL N/A 03/27/2022   Procedure: ESOPHAGOGASTRODUODENOSCOPY (EGD) WITH PROPOFOL;  Surgeon: Lin Landsman, MD;  Location: [facility];  Service: Gastroenterology;  Laterality: N/A;   JOINT  REPLACEMENT     OSTECTOMY Left 01/02/2022   Procedure: CALCANEAL EXOSTECTOMY;  Surgeon: Samara Deist, DPM;  Location: [facility];  Service: Podiatry;  Laterality: Left;   SPINAL FUSION     lumbar x2   TONSILLECTOMY     TOTAL KNEE ARTHROPLASTY Left 04/12/2019   Procedure: TOTAL KNEE ARTHROPLASTY;  Surgeon: Lovell Sheehan, MD;  Location: [facility];  Service: Orthopedics;  Laterality: Left;   TOTAL KNEE ARTHROPLASTY Right 04/17/2020   Procedure: TOTAL KNEE ARTHROPLASTY;  Surgeon: Lovell Sheehan, MD;  Location: [facility];  Service: Orthopedics;  Laterality: Right;   TUBAL LIGATION      Family History  Problem Relation Age of Onset   Heart disease Mother    Thyroid cancer Mother    Congestive Heart Failure Mother    Hypertension Father    Heart failure Maternal Uncle    Diabetes Maternal Uncle    Kidney disease Maternal Uncle    Breast cancer Maternal Grandmother    Heart failure Maternal Grandfather    Depression Daughter    Asthma Daughter     Social History   Tobacco Use   Smoking status: Former    Packs/day: 1.00    Years: 20.00    Total pack years: 20.00  Types: Cigarettes    Start date: 07/26/2002    Quit date: 04/06/2019    Years since quitting: 3.4   Smokeless tobacco: Never   Tobacco comments:    Quit Date 04/06/2019  Substance Use Topics   Alcohol use: Yes    Comment: occasional beer     Current Outpatient Medications:    amitriptyline (ELAVIL) 10 MG tablet, Take 1 tablet (10 mg total) by mouth at bedtime., Disp: 90 tablet, Rfl: 1   amLODipine (NORVASC) 5 MG tablet, TAKE 1 TABLET BY MOUTH ONCE DAILY, Disp: 90 tablet, Rfl: 0   aspirin EC 81 MG EC tablet, Take 1 tablet (81 mg total) by mouth daily. Swallow whole., Disp: 30 tablet, Rfl: 11   atorvastatin (LIPITOR) 40 MG tablet, Take 1 tablet (40 mg total) by mouth at bedtime., Disp: 90 tablet, Rfl: 1   cholecalciferol (VITAMIN D) 1000 units tablet, Take 1,000 Units by mouth daily., Disp: , Rfl:    Cyanocobalamin  (B-12) 1000 MCG SUBL, Place 1 each under the tongue 3 (three) times a week., Disp: , Rfl:    desvenlafaxine (PRISTIQ) 50 MG 24 hr tablet, Take 1 tablet (50 mg total) by mouth daily., Disp: 90 tablet, Rfl: 1   famotidine (PEPCID) 20 MG tablet, TAKE 1 TABLET(20 MG) BY MOUTH TWICE DAILY AS NEEDED FOR HEARTBURN OR INDIGESTION, Disp: 60 tablet, Rfl: 0   gabapentin (NEURONTIN) 300 MG capsule, Take 300 mg by mouth every 6 (six) hours as needed (Nerve pain)., Disp: , Rfl:    losartan-hydrochlorothiazide (HYZAAR) 100-25 MG tablet, TAKE 1 TABLET BY MOUTH EVERY DAY, Disp: 90 tablet, Rfl: 0   metFORMIN (GLUCOPHAGE) 500 MG tablet, Take 1 tablet (500 mg total) by mouth daily with breakfast., Disp: 90 tablet, Rfl: 1   Multiple Vitamins-Minerals (MULTIVITAMIN WITH MINERALS) tablet, Take 1 tablet by mouth every other day., Disp: , Rfl:    NARCAN 4 MG/0.1ML LIQD nasal spray kit, Place 1 spray into the nose once., Disp: , Rfl:    Oxycodone HCl 20 MG TABS, Take 20 mg by mouth every 4 (four) hours as needed (pain)., Disp: , Rfl:    pantoprazole (PROTONIX) 40 MG tablet, Take 1 tablet (40 mg total) by mouth 2 (two) times daily before a meal., Disp: 30 tablet, Rfl: 3   QUEtiapine (SEROQUEL) 25 MG tablet, Take 1 tablet (25 mg total) by mouth at bedtime., Disp: 90 tablet, Rfl: 1   spironolactone (ALDACTONE) 25 MG tablet, Take 0.5 tablets (12.5 mg total) by mouth daily., Disp: 45 tablet, Rfl: 1   tiZANidine (ZANAFLEX) 4 MG tablet, Take 4 mg by mouth every 8 (eight) hours., Disp: , Rfl:    valACYclovir (VALTREX) 1000 MG tablet, Take 1,000 mg by mouth 2 (two) times daily as needed., Disp: , Rfl:   No Known Allergies  I personally reviewed active problem list, medication list, allergies, family history, social history, health maintenance with the patient/caregiver today.   ROS  ***  Objective  There were no vitals filed for this visit.  There is no height or weight on file to calculate BMI.  Physical Exam ***  No  results found for this or any previous visit (from the past 2160 hour(s)).   PHQ2/9:    03/19/2022   10:06 AM 03/02/2022    2:40 PM 02/05/2022    8:59 AM 11/06/2021    2:30 PM 06/04/2021    2:42 PM  Depression screen PHQ 2/9  Decreased Interest 1 0 0 1 0  Down, Depressed,  Hopeless 1 0 0 1 0  PHQ - 2 Score 2 0 0 2 0  Altered sleeping 1 0 0 1 0  Tired, decreased energy 1 0 0 0 0  Change in appetite 0 0 0 0 0  Feeling bad or failure about yourself  0 0 0 0 0  Trouble concentrating 0 0 0 0 0  Moving slowly or fidgety/restless 0 0 0 0 0  Suicidal thoughts 0 0 0 0 0  PHQ-9 Score 4 0 0 3 0  Difficult doing work/chores Somewhat difficult Not difficult at all Not difficult at all Not difficult at all     phq 9 is {gen pos [id number]}   Fall Risk:    04/01/2022    3:42 PM 03/19/2022   10:06 AM 03/02/2022    2:39 PM 02/05/2022    8:59 AM 11/06/2021    2:32 PM  Fall Risk   Falls in the past year? _0 0 0  Number falls in past yr: 0 0 0 0 0  Injury with Fall? _1 0 0  Risk for fall due to :  History of fall(s) History of fall(s);Impaired balance/gait;Impaired mobility No Fall Risks No Fall Risks  Follow up  Education provided;Falls evaluation completed;Falls prevention discussed Education provided;Falls prevention discussed;Falls evaluation completed Falls prevention discussed Falls prevention discussed      Functional Status Survey:      Assessment & Plan  *** There are no diagnoses linked to this encounter.

## 2022-09-11 ENCOUNTER — Ambulatory Visit: Payer: Medicare Other | Admitting: Family Medicine

## 2022-09-11 DIAGNOSIS — Z23 Encounter for immunization: Secondary | ICD-10-CM

## 2022-09-11 DIAGNOSIS — Z87891 Personal history of nicotine dependence: Secondary | ICD-10-CM

## 2022-09-15 DIAGNOSIS — M25551 Pain in right hip: Secondary | ICD-10-CM | POA: Diagnosis not present

## 2022-09-15 DIAGNOSIS — M25561 Pain in right knee: Secondary | ICD-10-CM | POA: Diagnosis not present

## 2022-09-15 DIAGNOSIS — M25511 Pain in right shoulder: Secondary | ICD-10-CM | POA: Diagnosis not present

## 2022-09-15 DIAGNOSIS — G894 Chronic pain syndrome: Secondary | ICD-10-CM | POA: Diagnosis not present

## 2022-09-15 DIAGNOSIS — M25562 Pain in left knee: Secondary | ICD-10-CM | POA: Diagnosis not present

## 2022-09-15 DIAGNOSIS — Z79891 Long term (current) use of opiate analgesic: Secondary | ICD-10-CM | POA: Diagnosis not present

## 2022-09-15 DIAGNOSIS — M545 Low back pain, unspecified: Secondary | ICD-10-CM | POA: Diagnosis not present

## 2022-09-15 DIAGNOSIS — M79662 Pain in left lower leg: Secondary | ICD-10-CM | POA: Diagnosis not present

## 2022-09-15 DIAGNOSIS — M542 Cervicalgia: Secondary | ICD-10-CM | POA: Diagnosis not present

## 2022-09-15 DIAGNOSIS — M79661 Pain in right lower leg: Secondary | ICD-10-CM | POA: Diagnosis not present

## 2022-09-15 DIAGNOSIS — M25552 Pain in left hip: Secondary | ICD-10-CM | POA: Diagnosis not present

## 2022-09-15 DIAGNOSIS — M5431 Sciatica, right side: Secondary | ICD-10-CM | POA: Diagnosis not present

## 2022-09-15 DIAGNOSIS — M25512 Pain in left shoulder: Secondary | ICD-10-CM | POA: Diagnosis not present

## 2022-09-15 DIAGNOSIS — M5432 Sciatica, left side: Secondary | ICD-10-CM | POA: Diagnosis not present

## 2022-09-21 NOTE — Progress Notes (Unsigned)
Name: [name]   MRN: [id number]    DOB: 1961/08/13   Date:09/22/2022       Progress Note  Subjective  Chief Complaint  Follow Up  HPI  Metabolic syndrome:  She denies polyphagia, polydipsia or polyuria.  Continue life style modification, she is taking Metformin occasionally only . Last hgbA1C was up at 6.4 %, offered to recheck it today but she would like to hold off for now    HTN: bp today is at goal, she denies chest pain or palpitation    Depression Major: long history of depression, got worse when mother died in 13-Jan-2017. She states holiday season is hard. She is still on Seroquel and Pristiq . She is not seeing a psychiatrist . She states current dose of medications is fine    COPD: She quit smoking 04/2019   She denies any wheezing , she has a dry cough. She has SOB with mild activity now but that is new - likely from CHF. She is not taking any medication. Discussed lung cancer screening but she is not interested    IBS:she still has intermittent diarrhea and constipation. She states symptoms controlled with medication - Elavil    GERD: going to see Dr. Marius Ditch for EGD, she still  has intermittent pain on epigastric area and asked for refill of pantoprazole , I  reached out to Dr .Marius Ditch, she recommended to go down to 40 mg once a day for one month and stop it    Atherosclerosis of aorta: she has been taking Atorvastatin and aspirin , denies side effects   CHF diastolic and pulmonary hypertension: found on Echo done 01/2022 , still under the care of cardiologist  She has orthopnea and uses two pillows and stable, she has intermittent lower extremity edema. On ARB and spironolactone   Left achilles tendinitis: had surgery on May 2023 , she states gradually improving  History of TIA: she went [facility]2022 due to left facial droop and fatigue. Evaluation was negative, found to have anemia , had negative holter monitor   B12 deficiency: discussed SL form to take at least 3  times a week.   Chronic pain: going to the pain clinic and takes medications as prescribed  Fever blisters: takes Valtrex about once a month . Unchanged   Patient Active Problem List   Diagnosis Date Noted   Chronic GERD    Gastric erosion    Pulmonary hypertension ([place]) 84/66/5993   Diastolic heart failure ([place]) 03/19/2022   Other abnormalities of gait and mobility 03/02/2022   Obesity (BMI 30-39.9) 02/19/2022   Prediabetes 02/19/2022   Sinus bradycardia 02/19/2022   TIA (transient ischemic attack) 02/18/2022   Chronic pain 02/18/2022   Prolonged QT syndrome 02/05/2022   Status post left breast lumpectomy 01/23/2021   History of total knee arthroplasty, right 04/17/2020   S/P TKR (total knee replacement) using cement, left 04/12/2019   Benign neoplasm of ascending colon    Diverticulosis of large intestine without diverticulitis    Pancreatic lesion 04/21/2018   Abdominal aortic atherosclerosis ([place]) 04/21/2018   Acute peptic ulcer of stomach    Trigger thumb of right hand 02/25/2018   Carpal tunnel syndrome on both sides 10/21/2017   CRP elevated 08/09/2017   Elevated C-reactive protein 07/28/2017   Osteoarthritis of both knees 02/19/2016   COPD, mild ([place]) 05/08/2015   Vitamin D deficiency 57/11/7791   Metabolic syndrome 90/30/0923   Eczema 05/07/2015   Chronic insomnia 05/07/2015  Hypertension, benign 05/07/2015   Dyslipidemia 05/07/2015   Hyperglycemia 05/07/2015   Chronic low back pain 05/07/2015   Irritable bowel syndrome with both constipation and diarrhea 05/07/2015   Gastroesophageal reflux disease without esophagitis 05/07/2015   Major depression, recurrent, chronic ([place]) 05/07/2015   Menopausal syndrome (hot flashes) 05/07/2015   History of postmenopausal bleeding 01/14/2015    Past Surgical History:  Procedure Laterality Date   ACHILLES TENDON SURGERY Left 01/02/2022   Procedure: ACHILLES TENDON REPAIR-SECONDARY;  Surgeon: Samara Deist, DPM;  Location:  [facility] ORS;  Service: Podiatry;  Laterality: Left;   BACK SURGERY     BREAST EXCISIONAL BIOPSY Left ?   neg   BREAST LUMPECTOMY Left 01/08/2021   Procedure: BREAST LUMPECTOMY;  Surgeon: Ronny Bacon, MD;  Location: [facility] ORS;  Service: General;  Laterality: Left;   CARPAL TUNNEL RELEASE Left 08/15/2018   Procedure: CARPAL TUNNEL RELEASE;  Surgeon: Earnestine Leys, MD;  Location: [facility] ORS;  Service: Orthopedics;  Laterality: Left;   CERVICAL DISCECTOMY     x 2; metal plate   CHOLECYSTECTOMY     COLONOSCOPY WITH PROPOFOL N/A 08/08/2018   Procedure: COLONOSCOPY WITH PROPOFOL;  Surgeon: Virgel Manifold, MD;  Location: [facility] ENDOSCOPY;  Service: Endoscopy;  Laterality: N/A;   COLONOSCOPY WITH PROPOFOL N/A 12/01/2021   Procedure: COLONOSCOPY WITH PROPOFOL;  Surgeon: Lin Landsman, MD;  Location: [facility] ENDOSCOPY;  Service: Gastroenterology;  Laterality: N/A;   DILATATION & CURETTAGE/HYSTEROSCOPY WITH MYOSURE N/A 03/21/2015   Procedure: DILATATION & CURETTAGE/HYSTEROSCOPY;  Surgeon: Aletha Halim, MD;  Location: [facility] ORS;  Service: Gynecology;  Laterality: N/A;   DILATION AND CURETTAGE OF UTERUS     ENDOMETRIAL ABLATION     ESOPHAGOGASTRODUODENOSCOPY (EGD) WITH PROPOFOL N/A 04/13/2018   Procedure: ESOPHAGOGASTRODUODENOSCOPY (EGD) WITH PROPOFOL;  Surgeon: Virgel Manifold, MD;  Location: [facility] ENDOSCOPY;  Service: Endoscopy;  Laterality: N/A;   ESOPHAGOGASTRODUODENOSCOPY (EGD) WITH PROPOFOL N/A 08/08/2018   Procedure: ESOPHAGOGASTRODUODENOSCOPY (EGD) WITH PROPOFOL;  Surgeon: Virgel Manifold, MD;  Location: [facility] ENDOSCOPY;  Service: Endoscopy;  Laterality: N/A;   ESOPHAGOGASTRODUODENOSCOPY (EGD) WITH PROPOFOL N/A 03/25/2022   Procedure: ESOPHAGOGASTRODUODENOSCOPY (EGD) WITH PROPOFOL;  Surgeon: Lin Landsman, MD;  Location: [facility] ENDOSCOPY;  Service: Gastroenterology;  Laterality: N/A;   ESOPHAGOGASTRODUODENOSCOPY (EGD) WITH PROPOFOL N/A 03/27/2022   Procedure:  ESOPHAGOGASTRODUODENOSCOPY (EGD) WITH PROPOFOL;  Surgeon: Lin Landsman, MD;  Location: [facility] ENDOSCOPY;  Service: Gastroenterology;  Laterality: N/A;   JOINT REPLACEMENT     OSTECTOMY Left 01/02/2022   Procedure: CALCANEAL EXOSTECTOMY;  Surgeon: Samara Deist, DPM;  Location: [facility];  Service: Podiatry;  Laterality: Left;   SPINAL FUSION     lumbar x2   TONSILLECTOMY     TOTAL KNEE ARTHROPLASTY Left 04/12/2019   Procedure: TOTAL KNEE ARTHROPLASTY;  Surgeon: Lovell Sheehan, MD;  Location: [facility];  Service: Orthopedics;  Laterality: Left;   TOTAL KNEE ARTHROPLASTY Right 04/17/2020   Procedure: TOTAL KNEE ARTHROPLASTY;  Surgeon: Lovell Sheehan, MD;  Location: [facility];  Service: Orthopedics;  Laterality: Right;   TUBAL LIGATION      Family History  Problem Relation Age of Onset   Heart disease Mother    Thyroid cancer Mother    Congestive Heart Failure Mother    Hypertension Father    Heart failure Maternal Uncle    Diabetes Maternal Uncle    Kidney disease Maternal Uncle    Breast cancer Maternal Grandmother    Heart failure Maternal Grandfather    Depression Daughter    Asthma  Daughter     Social History   Tobacco Use   Smoking status: Former    Packs/day: 1.00    Years: 20.00    Total pack years: 20.00    Types: Cigarettes    Start date: 07/26/2002    Quit date: 04/06/2019    Years since quitting: 3.4   Smokeless tobacco: Never   Tobacco comments:    Quit Date 04/06/2019  Substance Use Topics   Alcohol use: Yes    Comment: occasional beer     Current Outpatient Medications:    amitriptyline (ELAVIL) 10 MG tablet, Take 1 tablet (10 mg total) by mouth at bedtime., Disp: 90 tablet, Rfl: 1   amLODipine (NORVASC) 5 MG tablet, TAKE 1 TABLET BY MOUTH ONCE DAILY, Disp: 90 tablet, Rfl: 0   aspirin EC 81 MG EC tablet, Take 1 tablet (81 mg total) by mouth daily. Swallow whole., Disp: 30 tablet, Rfl: 11   atorvastatin (LIPITOR) 40 MG tablet, Take 1 tablet (40 mg total) by  mouth at bedtime., Disp: 90 tablet, Rfl: 1   cholecalciferol (VITAMIN D) 1000 units tablet, Take 1,000 Units by mouth daily., Disp: , Rfl:    Cyanocobalamin (B-12) 1000 MCG SUBL, Place 1 each under the tongue 3 (three) times a week., Disp: , Rfl:    desvenlafaxine (PRISTIQ) 50 MG 24 hr tablet, Take 1 tablet (50 mg total) by mouth daily., Disp: 90 tablet, Rfl: 1   famotidine (PEPCID) 20 MG tablet, TAKE 1 TABLET(20 MG) BY MOUTH TWICE DAILY AS NEEDED FOR HEARTBURN OR INDIGESTION, Disp: 60 tablet, Rfl: 0   gabapentin (NEURONTIN) 300 MG capsule, Take 300 mg by mouth every 6 (six) hours as needed (Nerve pain)., Disp: , Rfl:    losartan-hydrochlorothiazide (HYZAAR) 100-25 MG tablet, TAKE 1 TABLET BY MOUTH EVERY DAY, Disp: 90 tablet, Rfl: 0   metFORMIN (GLUCOPHAGE) 500 MG tablet, Take 1 tablet (500 mg total) by mouth daily with breakfast., Disp: 90 tablet, Rfl: 1   Multiple Vitamins-Minerals (MULTIVITAMIN WITH MINERALS) tablet, Take 1 tablet by mouth every other day., Disp: , Rfl:    NARCAN 4 MG/0.1ML LIQD nasal spray kit, Place 1 spray into the nose once., Disp: , Rfl:    Oxycodone HCl 20 MG TABS, Take 20 mg by mouth every 4 (four) hours as needed (pain)., Disp: , Rfl:    pantoprazole (PROTONIX) 40 MG tablet, Take 1 tablet (40 mg total) by mouth 2 (two) times daily before a meal., Disp: 30 tablet, Rfl: 3   QUEtiapine (SEROQUEL) 25 MG tablet, Take 1 tablet (25 mg total) by mouth at bedtime., Disp: 90 tablet, Rfl: 1   spironolactone (ALDACTONE) 25 MG tablet, Take 0.5 tablets (12.5 mg total) by mouth daily., Disp: 45 tablet, Rfl: 1   tiZANidine (ZANAFLEX) 4 MG tablet, Take 4 mg by mouth every 8 (eight) hours., Disp: , Rfl:    valACYclovir (VALTREX) 1000 MG tablet, Take 1,000 mg by mouth 2 (two) times daily as needed., Disp: , Rfl:   No Known Allergies  I personally reviewed active problem list, medication list, allergies, family history, social history, health maintenance with the patient/caregiver  today.   ROS  Constitutional: Negative for fever or significant  weight change.  Respiratory: Negative for cough and shortness of breath.   Cardiovascular: Negative for chest pain or palpitations.  Gastrointestinal:positive for epigastric abdominal pain, no bowel changes.  Musculoskeletal: positive for gait problem -had left achilles tendon surgery, but no  joint swelling.  Skin: Negative for rash.  Neurological: Negative for dizziness or headache.  No other specific complaints in a complete review of systems (except as listed in HPI above).   Objective  Vitals:   09/22/22 0848  BP: 122/74  Pulse: 92  Resp: 16  SpO2: 98%  Weight: 208 lb (94.3 kg)  Height: _0  (1.727 m)    Body mass index is 31.63 kg/m.  Physical Exam  Constitutional: Patient appears well-developed and well-nourished. Obese  No distress.  HEENT: head atraumatic, normocephalic, pupils equal and reactive to light, neck supple Cardiovascular: Normal rate, regular rhythm and normal heart sounds.  No murmur heard. No BLE edema. Pulmonary/Chest: Effort normal and breath sounds normal. No respiratory distress. Abdominal: Soft.  There is no tenderness. Psychiatric: Patient has a normal mood and affect. behavior is normal. Judgment and thought content normal.   PHQ2/9:    09/22/2022    8:48 AM 03/19/2022   10:06 AM 03/02/2022    2:40 PM 02/05/2022    8:59 AM 11/06/2021    2:30 PM  Depression screen PHQ 2/9  Decreased Interest 1 1 0 0 1  Down, Depressed, Hopeless 1 1 0 0 1  PHQ - 2 Score 2 2 0 0 2  Altered sleeping 3 1 0 0 1  Tired, decreased energy 0 1 0 0 0  Change in appetite 0 0 0 0 0  Feeling bad or failure about yourself  0 0 0 0 0  Trouble concentrating 0 0 0 0 0  Moving slowly or fidgety/restless 0 0 0 0 0  Suicidal thoughts 0 0 0 0 0  PHQ-9 Score 5 4 0 0 3  Difficult doing work/chores  Somewhat difficult Not difficult at all Not difficult at all Not difficult at all    phq 9 is positive   Fall  Risk:    09/22/2022    8:47 AM 04/01/2022    3:42 PM 03/19/2022   10:06 AM 03/02/2022    2:39 PM 02/05/2022    8:59 AM  Fall Risk   Falls in the past year? 0 _1 0  Number falls in past yr: 0 0 0 0 0  Injury with Fall? 0 _2 0  Risk for fall due to : No Fall Risks  History of fall(s) History of fall(s);Impaired balance/gait;Impaired mobility No Fall Risks  Follow up Falls prevention discussed  Education provided;Falls evaluation completed;Falls prevention discussed Education provided;Falls prevention discussed;Falls evaluation completed Falls prevention discussed      Functional Status Survey: Is the patient deaf or have difficulty hearing?: No Does the patient have difficulty seeing, even when wearing glasses/contacts?: No Does the patient have difficulty concentrating, remembering, or making decisions?: No Does the patient have difficulty walking or climbing stairs?: Yes Does the patient have difficulty dressing or bathing?: No Does the patient have difficulty doing errands alone such as visiting a doctor's office or shopping?: No    Assessment & Plan  1. COPD, mild ([place])  Doing better since she quit smoking  2. Diastolic heart failure, unspecified HF chronicity ([place])  Compliant with medication  3. Need for immunization against influenza  - Flu Vaccine QUAD 6+ mos PF IM (Fluarix Quad PF)  4. Pulmonary hypertension ([place])  Keep follow up with cardiologist   5. Major depression, recurrent, chronic ([facility])  - desvenlafaxine (PRISTIQ) 50 MG 24 hr tablet; Take 1 tablet (50 mg total) by mouth daily.  Dispense: 90 tablet; Refill: 1 - QUEtiapine (SEROQUEL) 25 MG tablet; Take 1 tablet (  25 mg total) by mouth at bedtime.  Dispense: 90 tablet; Refill: 1 - buPROPion (WELLBUTRIN XL) 150 MG 24 hr tablet; Take 1 tablet (150 mg total) by mouth daily.  Dispense: 90 tablet; Refill: 0  6. Abdominal aortic atherosclerosis ([facility])  - atorvastatin (LIPITOR) 40 MG tablet; Take 1 tablet (40  mg total) by mouth at bedtime.  Dispense: 90 tablet; Refill: 1  7. TIA (transient ischemic attack)  - atorvastatin (LIPITOR) 40 MG tablet; Take 1 tablet (40 mg total) by mouth at bedtime.  Dispense: 90 tablet; Refill: 1  8. Irritable bowel syndrome with both constipation and diarrhea  - amitriptyline (ELAVIL) 10 MG tablet; Take 1 tablet (10 mg total) by mouth at bedtime.  Dispense: 90 tablet; Refill: 1  9. Gastroesophageal reflux disease without esophagitis  - pantoprazole (PROTONIX) 40 MG tablet; Take 1 tablet (40 mg total) by mouth daily.  Dispense: 30 tablet; Refill: 0  10. Chronic pain syndrome  Keep follow up with pain clinic   11. Metabolic syndrome   12. Prediabetes  Needs to take metformin daily   13. Hypertension, benign  - losartan-hydrochlorothiazide (HYZAAR) 100-25 MG tablet; Take 1 tablet by mouth daily.  Dispense: 90 tablet; Refill: 1 - amLODipine (NORVASC) 5 MG tablet; Take 1 tablet (5 mg total) by mouth daily.  Dispense: 90 tablet; Refill: 1  14. Chronic insomnia  - QUEtiapine (SEROQUEL) 25 MG tablet; Take 1 tablet (25 mg total) by mouth at bedtime.  Dispense: 90 tablet; Refill: 1  15. Fever blister  - valACYclovir (VALTREX) 1000 MG tablet; Take 1 tablet (1,000 mg total) by mouth 2 (two) times daily as needed.  Dispense: 10 tablet; Refill: 0

## 2022-09-22 ENCOUNTER — Ambulatory Visit (INDEPENDENT_AMBULATORY_CARE_PROVIDER_SITE_OTHER): Payer: Medicare Other | Admitting: Family Medicine

## 2022-09-22 ENCOUNTER — Encounter: Payer: Self-pay | Admitting: Family Medicine

## 2022-09-22 ENCOUNTER — Other Ambulatory Visit: Payer: Self-pay | Admitting: Family Medicine

## 2022-09-22 VITALS — BP 122/74 | HR 92 | Resp 16 | Ht 68.0 in | Wt 208.0 lb

## 2022-09-22 DIAGNOSIS — K219 Gastro-esophageal reflux disease without esophagitis: Secondary | ICD-10-CM

## 2022-09-22 DIAGNOSIS — K582 Mixed irritable bowel syndrome: Secondary | ICD-10-CM

## 2022-09-22 DIAGNOSIS — J449 Chronic obstructive pulmonary disease, unspecified: Secondary | ICD-10-CM

## 2022-09-22 DIAGNOSIS — G459 Transient cerebral ischemic attack, unspecified: Secondary | ICD-10-CM | POA: Diagnosis not present

## 2022-09-22 DIAGNOSIS — I10 Essential (primary) hypertension: Secondary | ICD-10-CM

## 2022-09-22 DIAGNOSIS — R7303 Prediabetes: Secondary | ICD-10-CM | POA: Diagnosis not present

## 2022-09-22 DIAGNOSIS — I272 Pulmonary hypertension, unspecified: Secondary | ICD-10-CM

## 2022-09-22 DIAGNOSIS — E8881 Metabolic syndrome: Secondary | ICD-10-CM | POA: Diagnosis not present

## 2022-09-22 DIAGNOSIS — I5030 Unspecified diastolic (congestive) heart failure: Secondary | ICD-10-CM

## 2022-09-22 DIAGNOSIS — G894 Chronic pain syndrome: Secondary | ICD-10-CM | POA: Diagnosis not present

## 2022-09-22 DIAGNOSIS — B001 Herpesviral vesicular dermatitis: Secondary | ICD-10-CM

## 2022-09-22 DIAGNOSIS — F5104 Psychophysiologic insomnia: Secondary | ICD-10-CM

## 2022-09-22 DIAGNOSIS — Z23 Encounter for immunization: Secondary | ICD-10-CM | POA: Diagnosis not present

## 2022-09-22 DIAGNOSIS — I700 Atherosclerosis of aorta: Secondary | ICD-10-CM | POA: Diagnosis not present

## 2022-09-22 DIAGNOSIS — F339 Major depressive disorder, recurrent, unspecified: Secondary | ICD-10-CM | POA: Diagnosis not present

## 2022-09-22 MED ORDER — BUPROPION HCL ER (XL) 150 MG PO TB24: 150.0000 mg | ORAL_TABLET | Freq: Every day | ORAL | 0 refills | Status: DC

## 2022-09-22 MED ORDER — AMLODIPINE BESYLATE 5 MG PO TABS: 5.0000 mg | ORAL_TABLET | Freq: Every day | ORAL | 1 refills | Status: DC

## 2022-09-22 MED ORDER — LOSARTAN POTASSIUM-HCTZ 100-25 MG PO TABS: 1.0000 | ORAL_TABLET | Freq: Every day | ORAL | 1 refills | Status: DC

## 2022-09-22 MED ORDER — QUETIAPINE FUMARATE 25 MG PO TABS: 25.0000 mg | ORAL_TABLET | Freq: Every day | ORAL | 1 refills | Status: DC

## 2022-09-22 MED ORDER — VALACYCLOVIR HCL 1 G PO TABS: 1000.0000 mg | ORAL_TABLET | Freq: Two times a day (BID) | ORAL | 0 refills | Status: DC | PRN

## 2022-09-22 MED ORDER — ATORVASTATIN CALCIUM 40 MG PO TABS: 40.0000 mg | ORAL_TABLET | Freq: Every day | ORAL | 1 refills | Status: DC

## 2022-09-22 MED ORDER — PANTOPRAZOLE SODIUM 40 MG PO TBEC: 40.0000 mg | DELAYED_RELEASE_TABLET | Freq: Every day | ORAL | 0 refills | Status: DC

## 2022-09-22 MED ORDER — DESVENLAFAXINE SUCCINATE ER 50 MG PO TB24: 50.0000 mg | ORAL_TABLET | Freq: Every day | ORAL | 1 refills | Status: DC

## 2022-09-22 MED ORDER — AMITRIPTYLINE HCL 10 MG PO TABS: 10.0000 mg | ORAL_TABLET | Freq: Every day | ORAL | 1 refills | Status: DC

## 2022-09-23 ENCOUNTER — Other Ambulatory Visit
Admission: RE | Admit: 2022-09-23 | Discharge: 2022-09-23 | Disposition: A | Discharge status: Home / Self Care | Payer: Medicare Other | Attending: Medical | Admitting: Medical

## 2022-09-23 DIAGNOSIS — Z79899 Other long term (current) drug therapy: Secondary | ICD-10-CM | POA: Diagnosis not present

## 2022-09-23 LAB — BASIC METABOLIC PANEL
Anion gap: 12 (ref 5–15)
BUN: 15 mg/dL (ref 6–20)
CO2: 23 mmol/L (ref 22–32)
Calcium: 10.4 mg/dL — ABNORMAL HIGH (ref 8.9–10.3)
Chloride: 101 mmol/L (ref 98–111)
Creatinine, Ser: 0.86 mg/dL (ref 0.44–1.00)
GFR, Estimated: >60 mL/min (ref >60)
Glucose, Bld: 100 mg/dL — ABNORMAL HIGH (ref 70–99)
Potassium: 3.8 mmol/L (ref 3.5–5.1)
Sodium: 136 mmol/L (ref 135–145)

## 2022-10-10 ENCOUNTER — Other Ambulatory Visit: Payer: Self-pay | Admitting: Family Medicine

## 2022-10-10 DIAGNOSIS — R110 Nausea: Secondary | ICD-10-CM

## 2022-10-15 DIAGNOSIS — M5431 Sciatica, right side: Secondary | ICD-10-CM | POA: Diagnosis not present

## 2022-10-15 DIAGNOSIS — M545 Low back pain, unspecified: Secondary | ICD-10-CM | POA: Diagnosis not present

## 2022-10-15 DIAGNOSIS — G894 Chronic pain syndrome: Secondary | ICD-10-CM | POA: Diagnosis not present

## 2022-10-15 DIAGNOSIS — M25511 Pain in right shoulder: Secondary | ICD-10-CM | POA: Diagnosis not present

## 2022-10-15 DIAGNOSIS — M25561 Pain in right knee: Secondary | ICD-10-CM | POA: Diagnosis not present

## 2022-10-15 DIAGNOSIS — M25512 Pain in left shoulder: Secondary | ICD-10-CM | POA: Diagnosis not present

## 2022-10-15 DIAGNOSIS — M79662 Pain in left lower leg: Secondary | ICD-10-CM | POA: Diagnosis not present

## 2022-10-15 DIAGNOSIS — M25552 Pain in left hip: Secondary | ICD-10-CM | POA: Diagnosis not present

## 2022-10-15 DIAGNOSIS — M25551 Pain in right hip: Secondary | ICD-10-CM | POA: Diagnosis not present

## 2022-10-15 DIAGNOSIS — M25562 Pain in left knee: Secondary | ICD-10-CM | POA: Diagnosis not present

## 2022-10-15 DIAGNOSIS — Z79891 Long term (current) use of opiate analgesic: Secondary | ICD-10-CM | POA: Diagnosis not present

## 2022-10-15 DIAGNOSIS — M79661 Pain in right lower leg: Secondary | ICD-10-CM | POA: Diagnosis not present

## 2022-10-15 DIAGNOSIS — M542 Cervicalgia: Secondary | ICD-10-CM | POA: Diagnosis not present

## 2022-10-16 ENCOUNTER — Other Ambulatory Visit: Payer: Self-pay | Admitting: Family Medicine

## 2022-10-16 DIAGNOSIS — K219 Gastro-esophageal reflux disease without esophagitis: Secondary | ICD-10-CM

## 2022-11-09 ENCOUNTER — Ambulatory Visit (INDEPENDENT_AMBULATORY_CARE_PROVIDER_SITE_OTHER): Payer: Medicare Other | Admitting: Family Medicine

## 2022-11-09 ENCOUNTER — Encounter: Payer: Self-pay | Admitting: Family Medicine

## 2022-11-09 DIAGNOSIS — Z87891 Personal history of nicotine dependence: Secondary | ICD-10-CM

## 2022-11-09 DIAGNOSIS — Z0000 Encounter for general adult medical examination without abnormal findings: Secondary | ICD-10-CM

## 2022-11-09 DIAGNOSIS — Z1231 Encounter for screening mammogram for malignant neoplasm of breast: Secondary | ICD-10-CM

## 2022-11-09 DIAGNOSIS — Z122 Encounter for screening for malignant neoplasm of respiratory organs: Secondary | ICD-10-CM

## 2022-11-09 NOTE — Patient Instructions (Signed)
It was great to see you!  Our plans for today:  - Someone will contact you about scheduling your mammogram and lung cancer screening. - Check with your pharmacy about updating your tetanus and COVID vaccines.  - See below for ways to increase your physical activity.   Take care and seek immediate care sooner if you develop any concerns.   Dr. Ky Barban  Things to do to keep yourself healthy  - Exercise at least 30-45 minutes a day, 3-4 days a week.  - Eat a low-fat diet with lots of fruits and vegetables, up to 7-9 servings per day.  - Seatbelts can save your life. Wear them always.  - Smoke detectors on every level of your home, check batteries every year.  - Eye Doctor - have an eye exam every 1-2 years  - Safe sex - if you may be exposed to STDs, use a condom.  - Alcohol -  If you drink, do it moderately, less than 2 drinks per day.  - [place]. Choose someone to speak for you if you are not able. https://www.prepareforyourcare.org is a great website to help you navigate this. - Depression is common in our stressful world.If you're feeling down or losing interest in things you normally enjoy, please come in for a visit.  - Violence - If anyone is threatening or hurting you, please call immediately.    Look for opportunities to move your body throughout your day:  Never lie down when you can sit; never sit when you can stand; never stand when you can pace.  Moving your body throughout the day is just as important as the 30 or 60 minutes of exercise at the gym!  Get social Get active with your friends instead of going out to eat. Go for a hike, walk around the mall, or play an exercise-themed video game.   Move more at work Fit more activity into the workday. Stand during phone calls, use a printer farther from your desk, and get up to stretch each hour.    Do something new Develop a new skill to kick-start your motivation. Sign up for a class to learn how to  [employer], surf, do tai chi, or play a sport.    Keep cool in the pool Don't like to sweat? Hit the local community pool for a swim, water polo, or water aerobics class to stay cool while exercising.    Stay on track Use a fitness tracker (FITBIT, Fitness Pal mobile app) to track your activity and provide motivation to reach your goals.

## 2022-11-09 NOTE — Progress Notes (Addendum)
Virtual Visit via Video Note  I connected with [name] on 11/09/22 at  2:00 PM EST by a video enabled telemedicine application and verified that I am speaking with the correct person using two identifiers.  Location: Patient: home Provider: home   I discussed the limitations of evaluation and management by telemedicine and the availability of in person appointments. The patient expressed understanding and agreed to proceed.  Annual Wellness Visit  Patient: [name], Female    DOB: 02-10-1961, 62 y.o.   MRN: [id number]  Subjective  Chief Complaint  Patient presents with   Medicare Wellness    [name] is a 62 y.o. female who presents today for her Annual Wellness Visit. She reports consuming a general diet. The patient does not participate in regular exercise at present. She generally feels fairly well. She reports sleeping poorly, has chronic insomnia. She does not have additional problems to discuss today.   HPI  Vision:Within last year and Dental: No current dental problems and Receives regular dental care   Patient Active Problem List   Diagnosis Date Noted   Chronic GERD    Gastric erosion    Pulmonary hypertension ([place]) 95/62/1308   Diastolic heart failure ([place]) 03/19/2022   Other abnormalities of gait and mobility 03/02/2022   Obesity (BMI 30-39.9) 02/19/2022   Prediabetes 02/19/2022   Sinus bradycardia 02/19/2022   TIA (transient ischemic attack) 02/18/2022   Chronic pain 02/18/2022   Prolonged QT syndrome 02/05/2022   Status post left breast lumpectomy 01/23/2021   History of total knee arthroplasty, right 04/17/2020   S/P TKR (total knee replacement) using cement, left 04/12/2019   Benign neoplasm of ascending colon    Diverticulosis of large intestine without diverticulitis    Pancreatic lesion 04/21/2018   Abdominal aortic atherosclerosis ([place]) 04/21/2018   Acute peptic ulcer of stomach    Trigger thumb of right hand 02/25/2018    Carpal tunnel syndrome on both sides 10/21/2017   Elevated C-reactive protein 07/28/2017   Osteoarthritis of both knees 02/19/2016   COPD, mild ([place]) 05/08/2015   Vitamin D deficiency 65/78/4696   Metabolic syndrome 29/52/8413   Eczema 05/07/2015   Chronic insomnia 05/07/2015   Hypertension, benign 05/07/2015   Dyslipidemia 05/07/2015   Hyperglycemia 05/07/2015   Chronic low back pain 05/07/2015   Irritable bowel syndrome with both constipation and diarrhea 05/07/2015   Gastroesophageal reflux disease without esophagitis 05/07/2015   Major depression, recurrent, chronic ([place]) 05/07/2015   Menopausal syndrome (hot flashes) 05/07/2015   History of postmenopausal bleeding 01/14/2015   Past Surgical History:  Procedure Laterality Date   ACHILLES TENDON SURGERY Left 01/02/2022   Procedure: ACHILLES TENDON REPAIR-SECONDARY;  Surgeon: Samara Deist, DPM;  Location: [facility] ORS;  Service: Podiatry;  Laterality: Left;   BACK SURGERY     BREAST EXCISIONAL BIOPSY Left ?   neg   BREAST LUMPECTOMY Left 01/08/2021   Procedure: BREAST LUMPECTOMY;  Surgeon: Ronny Bacon, MD;  Location: [facility];  Service: General;  Laterality: Left;   CARPAL TUNNEL RELEASE Left 08/15/2018   Procedure: CARPAL TUNNEL RELEASE;  Surgeon: Earnestine Leys, MD;  Location: [facility];  Service: Orthopedics;  Laterality: Left;   CERVICAL DISCECTOMY     x 2; metal plate   CHOLECYSTECTOMY     COLONOSCOPY WITH PROPOFOL N/A 08/08/2018   Procedure: COLONOSCOPY WITH PROPOFOL;  Surgeon: Virgel Manifold, MD;  Location: [facility] ENDOSCOPY;  Service: Endoscopy;  Laterality: N/A;   COLONOSCOPY WITH PROPOFOL N/A 12/01/2021  Procedure: COLONOSCOPY WITH PROPOFOL;  Surgeon: Lin Landsman, MD;  Location[facility];  Service: Gastroenterology;  Laterality: N/A;   DILATATION & CURETTAGE/HYSTEROSCOPY WITH MYOSURE N/A 03/21/2015   Procedure: DILATATION & CURETTAGE/HYSTEROSCOPY;  Surgeon: Aletha Halim, MD;  Location: [facility];   Service: Gynecology;  Laterality: N/A;   DILATION AND CURETTAGE OF UTERUS     ENDOMETRIAL ABLATION     ESOPHAGOGASTRODUODENOSCOPY (EGD) WITH PROPOFOL N/A 04/13/2018   Procedure: ESOPHAGOGASTRODUODENOSCOPY (EGD) WITH PROPOFOL;  Surgeon: Virgel Manifold, MD;  Location: [facility] ENDOSCOPY;  Service: Endoscopy;  Laterality: N/A;   ESOPHAGOGASTRODUODENOSCOPY (EGD) WITH PROPOFOL N/A 08/08/2018   Procedure: ESOPHAGOGASTRODUODENOSCOPY (EGD) WITH PROPOFOL;  Surgeon: Virgel Manifold, MD;  Location: [facility] ENDOSCOPY;  Service: Endoscopy;  Laterality: N/A;   ESOPHAGOGASTRODUODENOSCOPY (EGD) WITH PROPOFOL N/A 03/25/2022   Procedure: ESOPHAGOGASTRODUODENOSCOPY (EGD) WITH PROPOFOL;  Surgeon: Lin Landsman, MD;  Location: [facility] ENDOSCOPY;  Service: Gastroenterology;  Laterality: N/A;   ESOPHAGOGASTRODUODENOSCOPY (EGD) WITH PROPOFOL N/A 03/27/2022   Procedure: ESOPHAGOGASTRODUODENOSCOPY (EGD) WITH PROPOFOL;  Surgeon: Lin Landsman, MD;  Location: [facility] ENDOSCOPY;  Service: Gastroenterology;  Laterality: N/A;   JOINT REPLACEMENT     OSTECTOMY Left 01/02/2022   Procedure: CALCANEAL EXOSTECTOMY;  Surgeon: Samara Deist, DPM;  Location: [facility];  Service: Podiatry;  Laterality: Left;   SPINAL FUSION     lumbar x2   TONSILLECTOMY     TOTAL KNEE ARTHROPLASTY Left 04/12/2019   Procedure: TOTAL KNEE ARTHROPLASTY;  Surgeon: Lovell Sheehan, MD;  Location: [facility];  Service: Orthopedics;  Laterality: Left;   TOTAL KNEE ARTHROPLASTY Right 04/17/2020   Procedure: TOTAL KNEE ARTHROPLASTY;  Surgeon: Lovell Sheehan, MD;  Location: [facility];  Service: Orthopedics;  Laterality: Right;   TUBAL LIGATION     Social History   Tobacco Use   Smoking status: Former    Packs/day: 1.00    Years: 20.00    Total pack years: 20.00    Types: Cigarettes    Start date: 07/26/2002    Quit date: 04/06/2019    Years since quitting: 3.5   Smokeless tobacco: Never   Tobacco comments:    Quit Date 04/06/2019  Vaping Use    Vaping Use: Never used  Substance Use Topics   Alcohol use: Yes    Comment: occasional beer   Drug use: No      Medications: Outpatient Medications Prior to Visit  Medication Sig   amitriptyline (ELAVIL) 10 MG tablet Take 1 tablet (10 mg total) by mouth at bedtime.   amLODipine (NORVASC) 5 MG tablet Take 1 tablet (5 mg total) by mouth daily.   aspirin EC 81 MG EC tablet Take 1 tablet (81 mg total) by mouth daily. Swallow whole.   atorvastatin (LIPITOR) 40 MG tablet Take 1 tablet (40 mg total) by mouth at bedtime.   buPROPion (WELLBUTRIN XL) 150 MG 24 hr tablet Take 1 tablet (150 mg total) by mouth daily.   cholecalciferol (VITAMIN D) 1000 units tablet Take 1,000 Units by mouth daily.   Cyanocobalamin (B-12) 1000 MCG SUBL Place 1 each under the tongue 3 (three) times a week.   desvenlafaxine (PRISTIQ) 50 MG 24 hr tablet Take 1 tablet (50 mg total) by mouth daily.   gabapentin (NEURONTIN) 300 MG capsule Take 300 mg by mouth every 6 (six) hours as needed (Nerve pain).   losartan-hydrochlorothiazide (HYZAAR) 100-25 MG tablet Take 1 tablet by mouth daily.   metFORMIN (GLUCOPHAGE) 500 MG tablet Take 1 tablet (500 mg total) by mouth daily  with breakfast.   Multiple Vitamins-Minerals (MULTIVITAMIN WITH MINERALS) tablet Take 1 tablet by mouth every other day.   NARCAN 4 MG/0.1ML LIQD nasal spray kit Place 1 spray into the nose once.   Oxycodone HCl 20 MG TABS Take 20 mg by mouth every 4 (four) hours as needed (pain).   pantoprazole (PROTONIX) 40 MG tablet Take 1 tablet (40 mg total) by mouth daily.   QUEtiapine (SEROQUEL) 25 MG tablet Take 1 tablet (25 mg total) by mouth at bedtime.   spironolactone (ALDACTONE) 25 MG tablet Take 0.5 tablets (12.5 mg total) by mouth daily.   tiZANidine (ZANAFLEX) 4 MG tablet Take 4 mg by mouth every 8 (eight) hours.   valACYclovir (VALTREX) 1000 MG tablet Take 1 tablet (1,000 mg total) by mouth 2 (two) times daily as needed.   No facility-administered  medications prior to visit.    No Known Allergies  Patient Care Team: Steele Sizer, MD as PCP - General (Family Medicine) Kate Sable, MD as PCP - Cardiology (Cardiology) Shanon Ace, MD as Consulting Physician (Anesthesiology) Virgel Manifold, MD (Inactive) as Consulting Physician (Gastroenterology) Earnestine Leys, MD as Consulting Physician (Orthopedic Surgery)      Objective  There were no vitals taken for this visit. BP Readings from Last 3 Encounters:  09/22/22 122/74  08/11/22 (!) 140/86  05/07/22 130/80   Wt Readings from Last 3 Encounters:  09/22/22 208 lb (94.3 kg)  08/11/22 211 lb (95.7 kg)  05/07/22 205 lb (93 kg)      Physical Exam Well appearing, in NAD. Speaks in full sentences. Comfortable WOB on RA. No resp distress.    Most recent functional status assessment:    11/09/2022    1:12 PM  In your present state of health, do you have any difficulty performing the following activities:  Hearing? 0  Vision? 0  Difficulty concentrating or making decisions? 0  Walking or climbing stairs? 0  Dressing or bathing? 0  Doing errands, shopping? 0   Most recent fall risk assessment:    11/09/2022    1:12 PM  Fall Risk   Falls in the past year? 0  Number falls in past yr: 0  Injury with Fall? 0  Risk for fall due to : No Fall Risks  Follow up Falls prevention discussed;Education provided;Falls evaluation completed    Most recent depression screenings:    11/09/2022    1:12 PM 09/22/2022    8:48 AM  PHQ 2/9 Scores  PHQ - 2 Score 2 2  PHQ- 9 Score 5 5   Most recent cognitive screening:    11/09/2022    1:12 PM  6CIT Screen  What Year? 0 points  What month? 0 points  What time? 0 points  Count back from 20 0 points  Months in reverse 0 points  Repeat phrase 0 points  Total Score 0 points   Most recent Audit-C alcohol use screening    11/09/2022    1:12 PM  Alcohol Use Disorder Test (AUDIT)  1. How often do you have a  drink containing alcohol? 1  2. How many drinks containing alcohol do you have on a typical day when you are drinking? 0  3. How often do you have six or more drinks on one occasion? 0  AUDIT-C Score 1   A score of 3 or more in women, and 4 or more in men indicates increased risk for alcohol abuse, EXCEPT if all of the points are from question 1  Vision/Hearing Screen: No results found.  Last CBC Lab Results  Component Value Date   WBC 9.2 03/02/2022   HGB 12.7 03/02/2022   HCT 38.7 03/02/2022   MCV 92.4 03/02/2022   MCH 30.3 03/02/2022   RDW 12.3 03/02/2022   PLT 348 41/96/2229   Last metabolic panel Lab Results  Component Value Date   GLUCOSE 100 (H) 09/23/2022   NA 136 09/23/2022   K 3.8 09/23/2022   CL 101 09/23/2022   CO2 23 09/23/2022   BUN 15 09/23/2022   CREATININE 0.86 09/23/2022   GFRNONAA >60 09/23/2022   CALCIUM 10.4 (H) 09/23/2022   PROT 7.5 03/02/2022   ALBUMIN 3.4 (L) 02/18/2022   BILITOT 0.5 03/02/2022   ALKPHOS 82 02/18/2022   AST 19 03/02/2022   ALT 16 03/02/2022   ANIONGAP 12 09/23/2022   Last lipids Lab Results  Component Value Date   CHOL 120 02/19/2022   HDL 34 (L) 02/19/2022   LDLCALC 34 02/19/2022   TRIG 258 (H) 02/19/2022   CHOLHDL 3.5 02/19/2022   Last hemoglobin A1c Lab Results  Component Value Date   HGBA1C 6.4 (H) 02/18/2022      No results found for any visits on 11/09/22.    Assessment & Plan   Annual wellness visit done today including the all of the following: Reviewed patient's Family Medical History Reviewed and updated list of patient's medical providers Assessment of cognitive impairment was done Assessed patient's functional ability Established a written schedule for health screening [place] Completed and Reviewed  Exercise Activities and Dietary recommendations  Goals      Quit Smoking     Recommend to continue efforts to reduce smoking habits until no longer smoking (Smoking  Cessation literature attached to AVS).        Immunization History  Administered Date(s) Administered   Influenza,inj,Quad PF,6+ Mos 08/08/2015, 08/26/2016, 07/26/2017, 07/28/2018, 08/01/2019, 08/07/2020, 11/06/2021, 09/22/2022   Influenza-Unspecified 09/02/2013, 07/03/2014   PFIZER(Purple Top)SARS-COV-2 Vaccination 03/13/2020, 04/03/2020, 11/08/2020   PNEUMOCOCCAL CONJUGATE-20 06/04/2021   Pfizer Covid-19 Vaccine Bivalent Booster 43yr & up 08/25/2021   Zoster Recombinat (Shingrix) 02/21/2014, 08/25/2021   Zoster, Live 02/21/2014    Health Maintenance  Topic Date Due   DTaP/Tdap/Td (1 - Tdap) Never done   Lung Cancer Screening  Never done   MAMMOGRAM  12/31/2021   COVID-19 Vaccine (5 - 2023-24 season) 07/03/2022   PAP SMEAR-Modifier  08/04/2023   Medicare Annual Wellness (AWV)  11/10/2023   COLONOSCOPY (Pts 45-461yrInsurance coverage will need to be confirmed)  12/02/2031   INFLUENZA VACCINE  Completed   Hepatitis C Screening  Completed   HIV Screening  Completed   Zoster Vaccines- Shingrix  Completed   HPV VACCINES  Aged Out     Discussed health benefits of physical activity, and encouraged her to engage in regular exercise appropriate for her age and condition.    Problem List Items Addressed This Visit   None Visit Diagnoses     Medicare annual wellness visit, subsequent    -  Primary   Relevant Orders   MM 3D SCREEN BREAST BILATERAL   Ambulatory Referral Lung Cancer Screening [facility]   CT CHEST LUNG CA SCREEN LOW DOSE W/O CM   Screening for lung cancer       Relevant Orders   Ambulatory Referral Lung Cancer Screening [facility]   CT CHEST LUNG CA SCREEN LOW DOSE W/O CM   Former smoker       Relevant  Orders   Ambulatory Referral Lung Cancer Screening [facility]   CT CHEST LUNG CA SCREEN LOW DOSE W/O CM   Encounter for screening mammogram for malignant neoplasm of breast       Relevant Orders   MM 3D SCREEN BREAST BILATERAL        Return if symptoms worsen or fail to improve.     Myles Gip, DO

## 2022-11-10 ENCOUNTER — Ambulatory Visit: Payer: Medicare Other

## 2022-11-17 DIAGNOSIS — M25551 Pain in right hip: Secondary | ICD-10-CM | POA: Diagnosis not present

## 2022-11-17 DIAGNOSIS — M25511 Pain in right shoulder: Secondary | ICD-10-CM | POA: Diagnosis not present

## 2022-11-17 DIAGNOSIS — M25512 Pain in left shoulder: Secondary | ICD-10-CM | POA: Diagnosis not present

## 2022-11-17 DIAGNOSIS — Z79891 Long term (current) use of opiate analgesic: Secondary | ICD-10-CM | POA: Diagnosis not present

## 2022-11-17 DIAGNOSIS — M25562 Pain in left knee: Secondary | ICD-10-CM | POA: Diagnosis not present

## 2022-11-17 DIAGNOSIS — M25552 Pain in left hip: Secondary | ICD-10-CM | POA: Diagnosis not present

## 2022-11-17 DIAGNOSIS — M79662 Pain in left lower leg: Secondary | ICD-10-CM | POA: Diagnosis not present

## 2022-11-17 DIAGNOSIS — M170 Bilateral primary osteoarthritis of knee: Secondary | ICD-10-CM | POA: Diagnosis not present

## 2022-11-17 DIAGNOSIS — G894 Chronic pain syndrome: Secondary | ICD-10-CM | POA: Diagnosis not present

## 2022-11-17 DIAGNOSIS — M545 Low back pain, unspecified: Secondary | ICD-10-CM | POA: Diagnosis not present

## 2022-11-17 DIAGNOSIS — M5136 Other intervertebral disc degeneration, lumbar region: Secondary | ICD-10-CM | POA: Diagnosis not present

## 2022-11-17 DIAGNOSIS — M25561 Pain in right knee: Secondary | ICD-10-CM | POA: Diagnosis not present

## 2022-11-17 DIAGNOSIS — M79661 Pain in right lower leg: Secondary | ICD-10-CM | POA: Diagnosis not present

## 2022-11-24 ENCOUNTER — Telehealth: Payer: Self-pay | Admitting: Family Medicine

## 2022-11-24 DIAGNOSIS — R110 Nausea: Secondary | ICD-10-CM

## 2022-11-25 NOTE — Telephone Encounter (Signed)
09/22/22 Dc'd by provider Dr Ancil Boozer  Requested Prescriptions  Refused Prescriptions Disp Refills   famotidine (PEPCID) 20 MG tablet [Pharmacy Med Name: FAMOTIDINE '20MG'$  TABLETS] 180 tablet     Sig: TAKE 1 TABLET(20 MG) BY MOUTH TWICE DAILY AS NEEDED FOR HEARTBURN OR INDIGESTION     Gastroenterology:  H2 Antagonists Passed - 11/24/2022  4:13 PM      Passed - Valid encounter within last 12 months    Recent Outpatient Visits           2 weeks ago Medicare annual wellness visit, subsequent   [facility] Myles Gip, DO   2 months ago COPD, mild [facility])   [facility] [place], Drue Stager, MD   8 months ago Diastolic heart failure, unspecified HF chronicity [facility])   [facility] Steele Sizer, MD   8 months ago TIA (transient ischemic attack)   [facility] Delsa Grana, PA-C   9 months ago Acute nonintractable headache, unspecified headache type   [facility] Delsa Grana, [place]       Future Appointments             In 4 weeks Steele Sizer, MD [facility], [facility]

## 2022-12-04 ENCOUNTER — Encounter: Payer: Self-pay | Admitting: Medical

## 2022-12-04 ENCOUNTER — Ambulatory Visit: Payer: Medicare Other | Attending: Medical | Admitting: Medical

## 2022-12-04 ENCOUNTER — Other Ambulatory Visit: Payer: Self-pay | Admitting: *Deleted

## 2022-12-04 VITALS — BP 150/94 | HR 106 | Ht 68.0 in | Wt 207.8 lb

## 2022-12-04 DIAGNOSIS — J449 Chronic obstructive pulmonary disease, unspecified: Secondary | ICD-10-CM | POA: Diagnosis not present

## 2022-12-04 DIAGNOSIS — I5032 Chronic diastolic (congestive) heart failure: Secondary | ICD-10-CM | POA: Diagnosis not present

## 2022-12-04 DIAGNOSIS — I10 Essential (primary) hypertension: Secondary | ICD-10-CM | POA: Diagnosis not present

## 2022-12-04 DIAGNOSIS — G459 Transient cerebral ischemic attack, unspecified: Secondary | ICD-10-CM | POA: Diagnosis not present

## 2022-12-04 MED ORDER — CARVEDILOL 6.25 MG PO TABS: 6.2500 mg | ORAL_TABLET | Freq: Two times a day (BID) | ORAL | 1 refills | Status: DC

## 2022-12-04 MED ORDER — CARVEDILOL 6.25 MG PO TABS: 6.2500 mg | ORAL_TABLET | Freq: Two times a day (BID) | ORAL | 0 refills | Status: DC

## 2022-12-04 NOTE — Progress Notes (Signed)
Cardiology Office Note:    Date:  12/04/2022   ID:  [name], DOB 08/22/1961, MRN [id number]  PCP:  Steele Sizer, MD  [facility] Cardiologist:  Kate Sable, MD  [facility] Electrophysiologist:  None   Referring MD: Steele Sizer, MD   Chief Complaint: 4 month follow-up  History of Present Illness:    [name] is a 62 y.o. female with a hx of HFpEF, essential hypertension, hyperlipidemia, diabetes, former smoker x 15+ years, COPD, and peripheral edema who is here for follow up on her HFpEF.    She was admitted on 02/18/2022 due to facial droop.  Her symptoms resolved upon arrival to the emergency department she was diagnosed with a TIA.  At that time she was started on aspirin and Lipitor.  She started having symptoms of peripheral edema that she stated was ongoing for about a year but denied any palpitations, shortness of breath or chest pain.  She did also endorse some occasional orthopnea.  She had an echocardiogram that was done on 02/19/2022 which showed an EF of 60 to 65% with grade 2 diastolic dysfunction.  There were some concerns of TIA versus CVA and she was placed on a long-term monitor which showed no evidence of atrial fibrillation or atrial flutter to suggest etiology of CVA.  Patient triggered events associated with sinus rhythm or sinus tachycardia.  She had a minimum heart rate of 66 and maximal heart rate of 169 which was 101.  Predominant underlying rhythm was sinus rhythm.  Overall she had a benign cardiac monitor.   She was last seen 08/2022 and was overall doing well. The patient was started on spironolactone.   Today, the patient reports she is having acid-reflux. She has been taking antacids tablets and pepto. She denies chest pain and SOB. SOB is unchanged. Occasional lower leg edema, orthopnea or pnd. EKG shows ST 106bpm. Bp is elevated today.   Past Medical History:  Diagnosis Date   Abdominal aortic atherosclerosis (HCC)    Anemia     Arthritis    bilateral knees   Bronchitis    Carpal tunnel syndrome, bilateral    Contact dermatitis and other eczema, due to unspecified cause    COPD (chronic obstructive pulmonary disease) (HCC)    DDD (degenerative disc disease)    Depressive disorder    Diverticulosis    Dysmetabolic syndrome X    Eczema    GERD (gastroesophageal reflux disease)    Hyperlipidemia    Hypertension    IBS (irritable bowel syndrome)    Insomnia    Lumbago    Nonspecific abnormal electrocardiogram (ECG) (EKG)    Osteoarthritis of both knees    Other ovarian failure(256.39)    Peptic ulcer    Postmenopausal atrophic vaginitis    Symptomatic menopausal or female climacteric states    Unspecified vitamin D deficiency     Past Surgical History:  Procedure Laterality Date   ACHILLES TENDON SURGERY Left 01/02/2022   Procedure: ACHILLES TENDON REPAIR-SECONDARY;  Surgeon: Samara Deist, DPM;  Location: [facility] ORS;  Service: Podiatry;  Laterality: Left;   BACK SURGERY     BREAST EXCISIONAL BIOPSY Left ?   neg   BREAST LUMPECTOMY Left 01/08/2021   Procedure: BREAST LUMPECTOMY;  Surgeon: Ronny Bacon, MD;  Location: [facility] ORS;  Service: General;  Laterality: Left;   CARPAL TUNNEL RELEASE Left 08/15/2018   Procedure: CARPAL TUNNEL RELEASE;  Surgeon: Earnestine Leys, MD;  Location: [facility] ORS;  Service: Orthopedics;  Laterality: Left;   CERVICAL DISCECTOMY     x 2; metal plate   CHOLECYSTECTOMY     COLONOSCOPY WITH PROPOFOL N/A 08/08/2018   Procedure: COLONOSCOPY WITH PROPOFOL;  Surgeon: Virgel Manifold, MD;  Location: [facility] ENDOSCOPY;  Service: Endoscopy;  Laterality: N/A;   COLONOSCOPY WITH PROPOFOL N/A 12/01/2021   Procedure: COLONOSCOPY WITH PROPOFOL;  Surgeon: Lin Landsman, MD;  Location: [facility] ENDOSCOPY;  Service: Gastroenterology;  Laterality: N/A;   DILATATION & CURETTAGE/HYSTEROSCOPY WITH MYOSURE N/A 03/21/2015   Procedure: DILATATION & CURETTAGE/HYSTEROSCOPY;  Surgeon: Aletha Halim, MD;   Location: [facility] ORS;  Service: Gynecology;  Laterality: N/A;   DILATION AND CURETTAGE OF UTERUS     ENDOMETRIAL ABLATION     ESOPHAGOGASTRODUODENOSCOPY (EGD) WITH PROPOFOL N/A 04/13/2018   Procedure: ESOPHAGOGASTRODUODENOSCOPY (EGD) WITH PROPOFOL;  Surgeon: Virgel Manifold, MD;  Location: [facility] ENDOSCOPY;  Service: Endoscopy;  Laterality: N/A;   ESOPHAGOGASTRODUODENOSCOPY (EGD) WITH PROPOFOL N/A 08/08/2018   Procedure: ESOPHAGOGASTRODUODENOSCOPY (EGD) WITH PROPOFOL;  Surgeon: Virgel Manifold, MD;  Location: [facility] ENDOSCOPY;  Service: Endoscopy;  Laterality: N/A;   ESOPHAGOGASTRODUODENOSCOPY (EGD) WITH PROPOFOL N/A 03/25/2022   Procedure: ESOPHAGOGASTRODUODENOSCOPY (EGD) WITH PROPOFOL;  Surgeon: Lin Landsman, MD;  Location: [facility] ENDOSCOPY;  Service: Gastroenterology;  Laterality: N/A;   ESOPHAGOGASTRODUODENOSCOPY (EGD) WITH PROPOFOL N/A 03/27/2022   Procedure: ESOPHAGOGASTRODUODENOSCOPY (EGD) WITH PROPOFOL;  Surgeon: Lin Landsman, MD;  Location: [facility] ENDOSCOPY;  Service: Gastroenterology;  Laterality: N/A;   JOINT REPLACEMENT     OSTECTOMY Left 01/02/2022   Procedure: CALCANEAL EXOSTECTOMY;  Surgeon: Samara Deist, DPM;  Location: [facility] ORS;  Service: Podiatry;  Laterality: Left;   SPINAL FUSION     lumbar x2   TONSILLECTOMY     TOTAL KNEE ARTHROPLASTY Left 04/12/2019   Procedure: TOTAL KNEE ARTHROPLASTY;  Surgeon: Lovell Sheehan, MD;  Location: [facility] ORS;  Service: Orthopedics;  Laterality: Left;   TOTAL KNEE ARTHROPLASTY Right 04/17/2020   Procedure: TOTAL KNEE ARTHROPLASTY;  Surgeon: Lovell Sheehan, MD;  Location: [facility] ORS;  Service: Orthopedics;  Laterality: Right;   TUBAL LIGATION      Current Medications: Current Meds  Medication Sig   amitriptyline (ELAVIL) 10 MG tablet Take 1 tablet (10 mg total) by mouth at bedtime.   amLODipine (NORVASC) 5 MG tablet Take 1 tablet (5 mg total) by mouth daily.   aspirin EC 81 MG EC tablet Take 1 tablet (81 mg total) by mouth daily.  Swallow whole.   atorvastatin (LIPITOR) 40 MG tablet Take 1 tablet (40 mg total) by mouth at bedtime.   buPROPion (WELLBUTRIN XL) 150 MG 24 hr tablet Take 1 tablet (150 mg total) by mouth daily.   carvedilol (COREG) 6.25 MG tablet Take 1 tablet (6.25 mg total) by mouth 2 (two) times daily.   cholecalciferol (VITAMIN D) 1000 units tablet Take 1,000 Units by mouth daily.   Cyanocobalamin (B-12) 1000 MCG SUBL Place 1 each under the tongue 3 (three) times a week.   desvenlafaxine (PRISTIQ) 50 MG 24 hr tablet Take 1 tablet (50 mg total) by mouth daily.   gabapentin (NEURONTIN) 300 MG capsule Take 300 mg by mouth every 6 (six) hours as needed (Nerve pain).   losartan-hydrochlorothiazide (HYZAAR) 100-25 MG tablet Take 1 tablet by mouth daily.   metFORMIN (GLUCOPHAGE) 500 MG tablet Take 1 tablet (500 mg total) by mouth daily with breakfast.   Multiple Vitamins-Minerals (MULTIVITAMIN WITH MINERALS) tablet Take 1 tablet by mouth every other day.   NARCAN 4 MG/0.1ML LIQD nasal spray  kit Place 1 spray into the nose once.   Oxycodone HCl 20 MG TABS Take 20 mg by mouth every 4 (four) hours as needed (pain).   QUEtiapine (SEROQUEL) 25 MG tablet Take 1 tablet (25 mg total) by mouth at bedtime.   spironolactone (ALDACTONE) 25 MG tablet Take 0.5 tablets (12.5 mg total) by mouth daily.   tiZANidine (ZANAFLEX) 4 MG tablet Take 4 mg by mouth every 8 (eight) hours.   valACYclovir (VALTREX) 1000 MG tablet Take 1 tablet (1,000 mg total) by mouth 2 (two) times daily as needed.     Allergies:   Patient has no known allergies.   Social History   Socioeconomic History   Marital status: Married    Spouse name: Mikeal Hawthorne   Number of children: 4   Years of education: Not on file   Highest education level: 12th grade  Occupational History   Occupation: disabled    Comment: chronic back pain   Tobacco Use   Smoking status: Former    Packs/day: 1.00    Years: 20.00    Total pack years: 20.00    Types: Cigarettes     Start date: 07/26/2002    Quit date: 04/06/2019    Years since quitting: 3.6   Smokeless tobacco: Never   Tobacco comments:    Quit Date 04/06/2019  Vaping Use   Vaping Use: Never used  Substance and Sexual Activity   Alcohol use: Yes    Comment: occasional beer   Drug use: No   Sexual activity: Yes    Partners: Male    Birth control/protection: Other-see comments    Comment: Ablation  Other Topics Concern   Not on file  Social History Narrative   Not on file   Social Determinants of Health   Financial Resource Strain: Low Risk  (11/06/2021)   Overall Financial Resource Strain (CARDIA)    Difficulty of Paying Living Expenses: Not hard at all  Food Insecurity: No Food Insecurity (04/01/2022)   Hunger Vital Sign    Worried About Running Out of Food in the Last Year: Never true    Ran Out of Food in the Last Year: Never true  Transportation Needs: No Transportation Needs (04/01/2022)   PRAPARE - Hydrologist (Medical): No    Lack of Transportation (Non-Medical): No  Physical Activity: Inactive (11/06/2021)   Exercise Vital Sign    Days of Exercise per Week: 0 days    Minutes of Exercise per Session: 0 min  Stress: No Stress Concern Present (11/06/2021)   [place]    Feeling of Stress : Not at all  Social Connections: Moderately Isolated (11/06/2021)   Social Connection and Isolation Panel [NHANES]    Frequency of Communication with Friends and Family: More than three times a week    Frequency of Social Gatherings with Friends and Family: Three times a week    Attends Religious Services: Never    Active Member of Clubs or Organizations: No    Attends Archivist Meetings: Never    Marital Status: Married     Family History: The patient's family history includes Asthma in her daughter; Breast cancer in her maternal grandmother; Congestive Heart Failure in her mother; Depression in  her daughter; Diabetes in her maternal uncle; Heart disease in her mother; Heart failure in her maternal grandfather and maternal uncle; Hypertension in her father; Kidney disease in her maternal uncle; Thyroid cancer in  her mother.  ROS:   Please see the history of present illness.     All other systems reviewed and are negative.  EKGs/Labs/Other Studies Reviewed:    The following studies were reviewed today:  Heart monitor 04/2022 Patch Wear Time:  14 days and 0 hours (2023-05-26T15:09:28-0400 to 2023-06-09T15:09:32-0400)   Patient had a min HR of 66 bpm, max HR of 160 bpm, and avg HR of 101 bpm. Predominant underlying rhythm was Sinus Rhythm. Isolated SVEs were rare (<1.0%), SVE Couplets were rare (<1.0%), and SVE Triplets were rare (<1.0%). Isolated VEs were rare (<1.0%),  and no VE Couplets or VE Triplets were present.    No evidence for atrial fibrillation or atrial flutter to suggest etiology of CVA.  Patient triggered events associated with sinus rhythm or sinus tachycardia.  Overall benign cardiac monitor.   Echo 01/2022 1. Left ventricular ejection fraction, by estimation, is 60 to 65%. The  left ventricle has normal function. The left ventricle has no regional  wall motion abnormalities. Left ventricular diastolic parameters are  consistent with Grade II diastolic  dysfunction (pseudonormalization).   2. Right ventricular systolic function is normal. The right ventricular  size is normal. There is mildly elevated pulmonary artery systolic  pressure. The estimated right ventricular systolic pressure is 40.1 mmHg.   3. Left atrial size was mildly dilated.   4. The mitral valve is normal in structure. Mild mitral valve  regurgitation. No evidence of mitral stenosis.   5. Tricuspid valve regurgitation is mild to moderate.   6. The aortic valve is normal in structure. Aortic valve regurgitation is  not visualized. No aortic stenosis is present.   7. The inferior vena cava is  normal in size with greater than 50%  respiratory variability, suggesting right atrial pressure of 3 mmHg.   EKG:  EKG is ordered today.  The ekg ordered today demonstrates ST, 106bpm, nonspecific T wave changes  Recent Labs: 02/05/2022: TSH 0.90 02/18/2022: Magnesium 1.8 02/19/2022: B Natriuretic Peptide 271.9 03/02/2022: ALT 16; Hemoglobin 12.7; Platelets 348 09/23/2022: BUN 15; Creatinine, Ser 0.86; Potassium 3.8; Sodium 136  Recent Lipid Panel    Component Value Date/Time   CHOL 120 02/19/2022 0339   TRIG 258 (H) 02/19/2022 0339   HDL 34 (L) 02/19/2022 0339   CHOLHDL 3.5 02/19/2022 0339   VLDL 52 (H) 02/19/2022 0339   LDLCALC 34 02/19/2022 0339   LDLCALC 34 08/07/2020 1247    Physical Exam:    VS:  BP (!) 150/94 (BP Location: Left Arm, Patient Position: Sitting, Cuff Size: Large)   Pulse (!) 106   Ht '5\' 8"'$  (1.727 m)   Wt 207 lb 12.8 oz (94.3 kg)   SpO2 98%   BMI 31.60 kg/m     Wt Readings from Last 3 Encounters:  12/04/22 207 lb 12.8 oz (94.3 kg)  09/22/22 208 lb (94.3 kg)  08/11/22 211 lb (95.7 kg)     GEN:  Well nourished, well developed in no acute distress HEENT: Normal NECK: No JVD; No carotid bruits LYMPHATICS: No lymphadenopathy CARDIAC: RRR, no murmurs, rubs, gallops RESPIRATORY:  Clear to auscultation without rales, wheezing or rhonchi  ABDOMEN: Soft, non-tender, non-distended MUSCULOSKELETAL:  No edema; No deformity  SKIN: Warm and dry NEUROLOGIC:  Alert and oriented x 3 PSYCHIATRIC:  Normal affect   ASSESSMENT:    1. Chronic diastolic heart failure ([place])   2. Essential hypertension   3. TIA (transient ischemic attack)   4. Chronic obstructive pulmonary disease, unspecified COPD  type [facility])    PLAN:    In order of problems listed above:  HFpEF Echo 01/2022 showed LVEF 60-65%, G2DD. Patient reports occasional lower leg edema and DOE that is unchanged. She is euvolemic on exam today. Continue Losartan-HCTZ 100-'25mg'$  daily and spironolactone 12.'5mg'$   daily. I will add Coreg 6.'25mg'$  BID  HTN BP is elevated today. I will add Coreg as above. Continue Losartan-HCTZ and spironolactone.   H/o TIA Prior benign cardiac monitor with no afib/flutter. Continue Aspirin and Lipitor.   COPD Stable with no exacerbation.  Disposition: Follow up in 3 month(s) with MD    Signed, Edgardo Petrenko Ninfa Meeker, PA-C  12/04/2022 1:38 PM    [facility]

## 2022-12-04 NOTE — Patient Instructions (Signed)
Medication Instructions:  START carvedilol 6.25 mg-take 1 tablet by mouth twice a day  *If you need a refill on your cardiac medications before your next appointment, please call your pharmacy*   Lab Work: No labs ordered If you have labs  (blood work) drawn today and your tests are completely normal, you will receive your results only by: [place] (if you have MyChart) OR A paper copy in the mail If you have any lab test that is abnormal or we need to change your treatment, we will call you to review the results.   Testing/Procedures: No testing ordered   Follow-Up: At [facility], you and your health needs are our priority.  As part of our continuing mission to provide you with exceptional heart care, we have created designated Provider Care Teams.  These Care Teams include your primary Cardiologist (physician) and Advanced Practice Providers (APPs -  Physician Assistants and Nurse Practitioners) who all work together to provide you with the care you need, when you need it.  We recommend signing up for the patient portal called "MyChart".  Sign up information is provided on this After Visit Summary.  MyChart is used to connect with patients for Virtual Visits (Telemedicine).  Patients are able to view lab/test results, encounter notes, upcoming appointments, etc.  Non-urgent messages can be sent to your provider as well.   To learn more about what you can do with MyChart, go to [email or web address].    Your next appointment:   1 month(s)  Provider:   You may see Kate Sable, MD

## 2022-12-09 ENCOUNTER — Other Ambulatory Visit: Payer: Self-pay | Admitting: Family Medicine

## 2022-12-09 DIAGNOSIS — F339 Major depressive disorder, recurrent, unspecified: Secondary | ICD-10-CM

## 2022-12-17 DIAGNOSIS — M25561 Pain in right knee: Secondary | ICD-10-CM | POA: Diagnosis not present

## 2022-12-17 DIAGNOSIS — G894 Chronic pain syndrome: Secondary | ICD-10-CM | POA: Diagnosis not present

## 2022-12-17 DIAGNOSIS — M25551 Pain in right hip: Secondary | ICD-10-CM | POA: Diagnosis not present

## 2022-12-17 DIAGNOSIS — Z79891 Long term (current) use of opiate analgesic: Secondary | ICD-10-CM | POA: Diagnosis not present

## 2022-12-17 DIAGNOSIS — M25552 Pain in left hip: Secondary | ICD-10-CM | POA: Diagnosis not present

## 2022-12-17 DIAGNOSIS — M25562 Pain in left knee: Secondary | ICD-10-CM | POA: Diagnosis not present

## 2022-12-23 NOTE — Progress Notes (Deleted)
Name: [name]   MRN: [id number]    DOB: 08/03/1961   Date:12/23/2022       Progress Note  Subjective  Chief Complaint  Follow Up  HPI  Metabolic syndrome:  She denies polyphagia, polydipsia or polyuria.  Continue life style modification, she is taking Metformin occasionally only . Last hgbA1C was up at 6.4 %, offered to recheck it today but she would like to hold off for now    HTN: bp today is at goal, she denies chest pain or palpitation    Depression Major: long history of depression, got worse when mother died in 01-14-2017. She states holiday season is hard. She is still on Seroquel and Pristiq . She is not seeing a psychiatrist . She states current dose of medications is fine    COPD: She quit smoking 04/2019   She denies any wheezing , she has a dry cough. She has SOB with mild activity now but that is new - likely from CHF. She is not taking any medication. Discussed lung cancer screening but she is not interested    IBS:she still has intermittent diarrhea and constipation. She states symptoms controlled with medication - Elavil    GERD: going to see Dr. Marius Ditch for EGD, she still  has intermittent pain on epigastric area and asked for refill of pantoprazole , I  reached out to Dr .Marius Ditch, she recommended to go down to 40 mg once a day for one month and stop it    Atherosclerosis of aorta: she has been taking Atorvastatin and aspirin , denies side effects   CHF diastolic and pulmonary hypertension: found on Echo done 01/2022 , still under the care of cardiologist  She has orthopnea and uses two pillows and stable, she has intermittent lower extremity edema. On ARB and spironolactone   Left achilles tendinitis: had surgery on May 2023 , she states gradually improving  History of TIA: she went to [facility] on 02/18/2022 due to left facial droop and fatigue. Evaluation was negative, found to have anemia , had negative holter monitor   B12 deficiency: discussed SL form to take at least 3  times a week.   Chronic pain: going to the pain clinic and takes medications as prescribed  Fever blisters: takes Valtrex about once a month . Unchanged   Patient Active Problem List   Diagnosis Date Noted   Chronic GERD    Gastric erosion    Pulmonary hypertension ([place]) [id number]   Diastolic heart failure ([place]) 03/19/2022   Other abnormalities of gait and mobility 03/02/2022   Obesity (BMI 30-39.9) 02/19/2022   Prediabetes 02/19/2022   Sinus bradycardia 02/19/2022   TIA (transient ischemic attack) 02/18/2022   Chronic pain 02/18/2022   Prolonged QT syndrome 02/05/2022   Status post left breast lumpectomy 01/23/2021   History of total knee arthroplasty, right 04/17/2020   S/P TKR (total knee replacement) using cement, left 04/12/2019   Benign neoplasm of ascending colon    Diverticulosis of large intestine without diverticulitis    Pancreatic lesion 04/21/2018   Abdominal aortic atherosclerosis ([place]) 04/21/2018   Acute peptic ulcer of stomach    Trigger thumb of right hand 02/25/2018   Carpal tunnel syndrome on both sides 10/21/2017   Elevated C-reactive protein 07/28/2017   Osteoarthritis of both knees 02/19/2016   COPD, mild ([place]) 05/08/2015   Vitamin D deficiency [id number]   Metabolic syndrome 123XX123   Eczema 05/07/2015   Chronic insomnia 05/07/2015   Hypertension, benign 05/07/2015  Dyslipidemia 05/07/2015   Hyperglycemia 05/07/2015   Chronic low back pain 05/07/2015   Irritable bowel syndrome with both constipation and diarrhea 05/07/2015   Gastroesophageal reflux disease without esophagitis 05/07/2015   Major depression, recurrent, chronic ([place]) 05/07/2015   Menopausal syndrome (hot flashes) 05/07/2015   History of postmenopausal bleeding 01/14/2015    Past Surgical History:  Procedure Laterality Date   ACHILLES TENDON SURGERY Left 01/02/2022   Procedure: ACHILLES TENDON REPAIR-SECONDARY;  Surgeon: Samara Deist, DPM;  Location: [facility] ORS;  Service:  Podiatry;  Laterality: Left;   BACK SURGERY     BREAST EXCISIONAL BIOPSY Left ?   neg   BREAST LUMPECTOMY Left 01/08/2021   Procedure: BREAST LUMPECTOMY;  Surgeon: Ronny Bacon, MD;  Location: [facility];  Service: General;  Laterality: Left;   CARPAL TUNNEL RELEASE Left 08/15/2018   Procedure: CARPAL TUNNEL RELEASE;  Surgeon: Earnestine Leys, MD;  Location: [facility];  Service: Orthopedics;  Laterality: Left;   CERVICAL DISCECTOMY     x 2; metal plate   CHOLECYSTECTOMY     COLONOSCOPY WITH PROPOFOL N/A 08/08/2018   Procedure: COLONOSCOPY WITH PROPOFOL;  Surgeon: Virgel Manifold, MD;  Location: [facility] ENDOSCOPY;  Service: Endoscopy;  Laterality: N/A;   COLONOSCOPY WITH PROPOFOL N/A 12/01/2021   Procedure: COLONOSCOPY WITH PROPOFOL;  Surgeon: Lin Landsman, MD;  Location: [facility]COPY;  Service: Gastroenterology;  Laterality: N/A;   DILATATION & CURETTAGE/HYSTEROSCOPY WITH MYOSURE N/A 03/21/2015   Procedure: DILATATION & CURETTAGE/HYSTEROSCOPY;  Surgeon: Aletha Halim, MD;  Location: [facility];  Service: Gynecology;  Laterality: N/A;   DILATION AND CURETTAGE OF UTERUS     ENDOMETRIAL ABLATION     ESOPHAGOGASTRODUODENOSCOPY (EGD) WITH PROPOFOL N/A 04/13/2018   Procedure: ESOPHAGOGASTRODUODENOSCOPY (EGD) WITH PROPOFOL;  Surgeon: Virgel Manifold, MD;  Location: [facility] ENDOSCOPY;  Service: Endoscopy;  Laterality: N/A;   ESOPHAGOGASTRODUODENOSCOPY (EGD) WITH PROPOFOL N/A 08/08/2018   Procedure: ESOPHAGOGASTRODUODENOSCOPY (EGD) WITH PROPOFOL;  Surgeon: Virgel Manifold, MD;  Location: [facility] ENDOSCOPY;  Service: Endoscopy;  Laterality: N/A;   ESOPHAGOGASTRODUODENOSCOPY (EGD) WITH PROPOFOL N/A 03/25/2022   Procedure: ESOPHAGOGASTRODUODENOSCOPY (EGD) WITH PROPOFOL;  Surgeon: Lin Landsman, MD;  Location[facility]COPY;  Service: Gastroenterology;  Laterality: N/A;   ESOPHAGOGASTRODUODENOSCOPY (EGD) WITH PROPOFOL N/A 03/27/2022   Procedure: ESOPHAGOGASTRODUODENOSCOPY (EGD) WITH  PROPOFOL;  Surgeon: Lin Landsman, MD;  Location: [facility] ENDOSCOPY;  Service: Gastroenterology;  Laterality: N/A;   JOINT REPLACEMENT     OSTECTOMY Left 01/02/2022   Procedure: CALCANEAL EXOSTECTOMY;  Surgeon: Samara Deist, DPM;  Location: [facility];  Service: Podiatry;  Laterality: Left;   SPINAL FUSION     lumbar x2   TONSILLECTOMY     TOTAL KNEE ARTHROPLASTY Left 04/12/2019   Procedure: TOTAL KNEE ARTHROPLASTY;  Surgeon: Lovell Sheehan, MD;  Location: [facility];  Service: Orthopedics;  Laterality: Left;   TOTAL KNEE ARTHROPLASTY Right 04/17/2020   Procedure: TOTAL KNEE ARTHROPLASTY;  Surgeon: Lovell Sheehan, MD;  Location: [facility];  Service: Orthopedics;  Laterality: Right;   TUBAL LIGATION      Family History  Problem Relation Age of Onset   Heart disease Mother    Thyroid cancer Mother    Congestive Heart Failure Mother    Hypertension Father    Heart failure Maternal Uncle    Diabetes Maternal Uncle    Kidney disease Maternal Uncle    Breast cancer Maternal Grandmother    Heart failure Maternal Grandfather    Depression Daughter    Asthma Daughter  Social History   Tobacco Use   Smoking status: Former    Packs/day: 1.00    Years: 20.00    Total pack years: 20.00    Types: Cigarettes    Start date: 07/26/2002    Quit date: 04/06/2019    Years since quitting: 3.7   Smokeless tobacco: Never   Tobacco comments:    Quit Date 04/06/2019  Substance Use Topics   Alcohol use: Yes    Comment: occasional beer     Current Outpatient Medications:    amitriptyline (ELAVIL) 10 MG tablet, Take 1 tablet (10 mg total) by mouth at bedtime., Disp: 90 tablet, Rfl: 1   amLODipine (NORVASC) 5 MG tablet, Take 1 tablet (5 mg total) by mouth daily., Disp: 90 tablet, Rfl: 1   aspirin EC 81 MG EC tablet, Take 1 tablet (81 mg total) by mouth daily. Swallow whole., Disp: 30 tablet, Rfl: 11   atorvastatin (LIPITOR) 40 MG tablet, Take 1 tablet (40 mg total) by mouth at bedtime., Disp: 90  tablet, Rfl: 1   buPROPion (WELLBUTRIN XL) 150 MG 24 hr tablet, TAKE 1 TABLET(150 MG) BY MOUTH DAILY, Disp: 30 tablet, Rfl: 0   carvedilol (COREG) 6.25 MG tablet, Take 1 tablet (6.25 mg total) by mouth 2 (two) times daily., Disp: 180 tablet, Rfl: 0   cholecalciferol (VITAMIN D) 1000 units tablet, Take 1,000 Units by mouth daily., Disp: , Rfl:    Cyanocobalamin (B-12) 1000 MCG SUBL, Place 1 each under the tongue 3 (three) times a week., Disp: , Rfl:    desvenlafaxine (PRISTIQ) 50 MG 24 hr tablet, Take 1 tablet (50 mg total) by mouth daily., Disp: 90 tablet, Rfl: 1   gabapentin (NEURONTIN) 300 MG capsule, Take 300 mg by mouth every 6 (six) hours as needed (Nerve pain)., Disp: , Rfl:    losartan-hydrochlorothiazide (HYZAAR) 100-25 MG tablet, Take 1 tablet by mouth daily., Disp: 90 tablet, Rfl: 1   metFORMIN (GLUCOPHAGE) 500 MG tablet, Take 1 tablet (500 mg total) by mouth daily with breakfast., Disp: 90 tablet, Rfl: 1   Multiple Vitamins-Minerals (MULTIVITAMIN WITH MINERALS) tablet, Take 1 tablet by mouth every other day., Disp: , Rfl:    NARCAN 4 MG/0.1ML LIQD nasal spray kit, Place 1 spray into the nose once., Disp: , Rfl:    Oxycodone HCl 20 MG TABS, Take 20 mg by mouth every 4 (four) hours as needed (pain)., Disp: , Rfl:    pantoprazole (PROTONIX) 40 MG tablet, Take 1 tablet (40 mg total) by mouth daily. (Patient not taking: Reported on 12/04/2022), Disp: 30 tablet, Rfl: 0   QUEtiapine (SEROQUEL) 25 MG tablet, Take 1 tablet (25 mg total) by mouth at bedtime., Disp: 90 tablet, Rfl: 1   spironolactone (ALDACTONE) 25 MG tablet, Take 0.5 tablets (12.5 mg total) by mouth daily., Disp: 45 tablet, Rfl: 1   tiZANidine (ZANAFLEX) 4 MG tablet, Take 4 mg by mouth every 8 (eight) hours., Disp: , Rfl:    valACYclovir (VALTREX) 1000 MG tablet, Take 1 tablet (1,000 mg total) by mouth 2 (two) times daily as needed., Disp: 10 tablet, Rfl: 0  No Known Allergies  I personally reviewed active problem list, medication  list, allergies, family history, social history, health maintenance with the patient/caregiver today.   ROS  ***  Objective  There were no vitals filed for this visit.  There is no height or weight on file to calculate BMI.  Physical Exam ***  No results found for this or any previous visit (  from the past 2160 hour(s)).   PHQ2/9:    11/09/2022    1:12 PM 09/22/2022    8:48 AM 03/19/2022   10:06 AM 03/02/2022    2:40 PM 02/05/2022    8:59 AM  Depression screen PHQ 2/9  Decreased Interest 1 1 1 $ 0 0  Down, Depressed, Hopeless 1 1 1 $ 0 0  PHQ - 2 Score 2 2 2 $ 0 0  Altered sleeping 2 3 1 $ 0 0  Tired, decreased energy 1 0 1 0 0  Change in appetite 0 0 0 0 0  Feeling bad or failure about yourself  0 0 0 0 0  Trouble concentrating 0 0 0 0 0  Moving slowly or fidgety/restless 0 0 0 0 0  Suicidal thoughts 0 0 0 0 0  PHQ-9 Score 5 5 4 $ 0 0  Difficult doing work/chores Somewhat difficult  Somewhat difficult Not difficult at all Not difficult at all    phq 9 is {gen pos [id number]   Fall Risk:    11/09/2022    1:12 PM 09/22/2022    8:47 AM 04/01/2022    3:42 PM 03/19/2022   10:06 AM 03/02/2022    2:39 PM  Fall Risk   Falls in the past year? 0 0 1 1 1  $ Number falls in past yr: 0 0 0 0 0  Injury with Fall? 0 0 1 1 1  $ Risk for fall due to : No Fall Risks No Fall Risks  History of fall(s) History of fall(s);Impaired balance/gait;Impaired mobility  Follow up Falls prevention discussed;Education provided;Falls evaluation completed Falls prevention discussed  Education provided;Falls evaluation completed;Falls prevention discussed Education provided;Falls prevention discussed;Falls evaluation completed      Functional Status Survey:      Assessment & Plan  *** There are no diagnoses linked to this encounter.

## 2022-12-24 ENCOUNTER — Ambulatory Visit: Payer: Medicare Other | Admitting: Family Medicine

## 2022-12-31 ENCOUNTER — Ambulatory Visit: Payer: Self-pay

## 2022-12-31 NOTE — Telephone Encounter (Signed)
  Chief Complaint: Indigestion /nausea/heartburn Symptoms: Watery mouth with nausea, burning Frequency: ongoing since taken off pantoprazole. Pertinent Negatives: Patient denies Chest pain Disposition: []$ ED /[]$ Urgent Care (no appt availability in office) / [x]$ Appointment(In office/virtual)/ []$  [facility]/ []$ Home Care/ [x]$ Refused Recommended Disposition /[]$ [facility]/ []$  Follow-up with PCP Additional Notes: PT called to request medication for heart burn. Pt does not want to come into the office. PT sates that this is the same as she used to have before she started taking pantoprazole and Omeprazole.  (Pt did not take both at the same time.)  PT would like provider to call in Rx for heartburn.  Please advise.   Summary: heartburn with burning and feeling nauseated   Patient called in has heartburn with burning and feeling nauseated. She is asking to be put back on Omeprazole     Reason for Disposition  Nausea lasts > 1 week  Answer Assessment - Initial Assessment Questions 1. NAUSEA SEVERITY: "How bad is the nausea?" (e.g., mild, moderate, severe; dehydration, weight loss)   - MILD: loss of appetite without change in eating habits   - MODERATE: decreased oral intake without significant weight loss, dehydration, or malnutrition   - SEVERE: inadequate caloric or fluid intake, significant weight loss, symptoms of dehydration     Mild 2. ONSET: "When did the nausea begin?"     When she stops taking indigestion medication 3. VOMITING: "Any vomiting?" If Yes, ask: "How many times today?"     NO mouth waters 4. RECURRENT SYMPTOM: "Have you had nausea before?" If Yes, ask: "When was the last time?" "What happened that time?"     Ongoing 5. CAUSE: "What do you think is causing the nausea?"     Heart burn  Protocols used: Nausea-A-AH

## 2023-01-04 NOTE — Telephone Encounter (Signed)
Tried to call pt to schedule and MB is full

## 2023-01-14 DIAGNOSIS — Z79891 Long term (current) use of opiate analgesic: Secondary | ICD-10-CM | POA: Diagnosis not present

## 2023-01-14 DIAGNOSIS — M25562 Pain in left knee: Secondary | ICD-10-CM | POA: Diagnosis not present

## 2023-01-14 DIAGNOSIS — M25561 Pain in right knee: Secondary | ICD-10-CM | POA: Diagnosis not present

## 2023-01-14 DIAGNOSIS — M79661 Pain in right lower leg: Secondary | ICD-10-CM | POA: Diagnosis not present

## 2023-01-14 DIAGNOSIS — M79662 Pain in left lower leg: Secondary | ICD-10-CM | POA: Diagnosis not present

## 2023-01-14 DIAGNOSIS — G894 Chronic pain syndrome: Secondary | ICD-10-CM | POA: Diagnosis not present

## 2023-01-19 ENCOUNTER — Encounter: Payer: Self-pay | Admitting: Medical

## 2023-01-19 ENCOUNTER — Ambulatory Visit: Payer: Medicare Other | Attending: Medical | Admitting: Medical

## 2023-01-19 NOTE — Progress Notes (Deleted)
Cardiology Office Note:    Date:  01/19/2023   ID:  [name], DOB 06-Aug-1961, MRN [id number]  PCP:  Steele Sizer, MD  [facility] Cardiologist:  Kate Sable, MD  [facility] Electrophysiologist:  None   Referring MD: Steele Sizer, MD   Chief Complaint: ***  History of Present Illness:    [name] is a 62 y.o. female with a hx of HFpEF, hypertension, hyperlipidemia, diabetes, former smoker x 15 years, COPD, and peripheral edema presents for 1 month follow-up.  She was admitted on 02/18/2022 due to facial droop.  Her symptoms resolved upon arrival to the emergency department she was diagnosed with a TIA.  At that time she was started on aspirin and Lipitor.  She started having symptoms of peripheral edema that she stated was ongoing for about a year but denied any palpitations, shortness of breath or chest pain.  She did also endorse some occasional orthopnea.  She had an echocardiogram that was done on 02/19/2022 which showed an EF of 60 to 65% with grade 2 diastolic dysfunction.  There were some concerns of TIA versus CVA and she was placed on a long-term monitor which showed no evidence of atrial fibrillation or atrial flutter to suggest etiology of CVA.  Patient triggered events associated with sinus rhythm or sinus tachycardia.  She had a minimum heart rate of 66 and maximal heart rate of 169 which was 101.  Predominant underlying rhythm was sinus rhythm.  Overall she had a benign cardiac monitor.   She was last seen December 04, 2018 for reported acid reflux.  Patient was mildly tachycardic and Coreg was added.  Today,  Past Medical History:  Diagnosis Date   Abdominal aortic atherosclerosis ([place])    Anemia    Arthritis    bilateral knees   Bronchitis    Carpal tunnel syndrome, bilateral    Contact dermatitis and other eczema, due to unspecified cause    COPD (chronic obstructive pulmonary disease) (HCC)    DDD (degenerative disc disease)     Depressive disorder    Diverticulosis    Dysmetabolic syndrome X    Eczema    GERD (gastroesophageal reflux disease)    Hyperlipidemia    Hypertension    IBS (irritable bowel syndrome)    Insomnia    Lumbago    Nonspecific abnormal electrocardiogram (ECG) (EKG)    Osteoarthritis of both knees    Other ovarian failure(256.39)    Peptic ulcer    Postmenopausal atrophic vaginitis    Symptomatic menopausal or female climacteric states    Unspecified vitamin D deficiency     Past Surgical History:  Procedure Laterality Date   ACHILLES TENDON SURGERY Left 01/02/2022   Procedure: ACHILLES TENDON REPAIR-SECONDARY;  Surgeon: Samara Deist, DPM;  Location: [facility];  Service: Podiatry;  Laterality: Left;   BACK SURGERY     BREAST EXCISIONAL BIOPSY Left ?   neg   BREAST LUMPECTOMY Left 01/08/2021   Procedure: BREAST LUMPECTOMY;  Surgeon: Ronny Bacon, MD;  Location: [facility];  Service: General;  Laterality: Left;   CARPAL TUNNEL RELEASE Left 08/15/2018   Procedure: CARPAL TUNNEL RELEASE;  Surgeon: Earnestine Leys, MD;  Location: [facility];  Service: Orthopedics;  Laterality: Left;   CERVICAL DISCECTOMY     x 2; metal plate   CHOLECYSTECTOMY     COLONOSCOPY WITH PROPOFOL N/A 08/08/2018   Procedure: COLONOSCOPY WITH PROPOFOL;  Surgeon: Virgel Manifold, MD;  Location: [facility] ENDOSCOPY;  Service: Endoscopy;  Laterality: N/A;   COLONOSCOPY WITH PROPOFOL N/A 12/01/2021   Procedure: COLONOSCOPY WITH PROPOFOL;  Surgeon: Lin Landsman, MD;  Location: [facility] ENDOSCOPY;  Service: Gastroenterology;  Laterality: N/A;   DILATATION & CURETTAGE/HYSTEROSCOPY WITH MYOSURE N/A 03/21/2015   Procedure: DILATATION & CURETTAGE/HYSTEROSCOPY;  Surgeon: Aletha Halim, MD;  Location: [facility];  Service: Gynecology;  Laterality: N/A;   DILATION AND CURETTAGE OF UTERUS     ENDOMETRIAL ABLATION     ESOPHAGOGASTRODUODENOSCOPY (EGD) WITH PROPOFOL N/A 04/13/2018   Procedure: ESOPHAGOGASTRODUODENOSCOPY (EGD) WITH  PROPOFOL;  Surgeon: Virgel Manifold, MD;  Location: [facility] ENDOSCOPY;  Service: Endoscopy;  Laterality: N/A;   ESOPHAGOGASTRODUODENOSCOPY (EGD) WITH PROPOFOL N/A 08/08/2018   Procedure: ESOPHAGOGASTRODUODENOSCOPY (EGD) WITH PROPOFOL;  Surgeon: Virgel Manifold, MD;  Location: [facility] ENDOSCOPY;  Service: Endoscopy;  Laterality: N/A;   ESOPHAGOGASTRODUODENOSCOPY (EGD) WITH PROPOFOL N/A 03/25/2022   Procedure: ESOPHAGOGASTRODUODENOSCOPY (EGD) WITH PROPOFOL;  Surgeon: Lin Landsman, MD;  Location: [facility] ENDOSCOPY;  Service: Gastroenterology;  Laterality: N/A;   ESOPHAGOGASTRODUODENOSCOPY (EGD) WITH PROPOFOL N/A 03/27/2022   Procedure: ESOPHAGOGASTRODUODENOSCOPY (EGD) WITH PROPOFOL;  Surgeon: Lin Landsman, MD;  Location: [facility] ENDOSCOPY;  Service: Gastroenterology;  Laterality: N/A;   JOINT REPLACEMENT     OSTECTOMY Left 01/02/2022   Procedure: CALCANEAL EXOSTECTOMY;  Surgeon: Samara Deist, DPM;  Location: [facility];  Service: Podiatry;  Laterality: Left;   SPINAL FUSION     lumbar x2   TONSILLECTOMY     TOTAL KNEE ARTHROPLASTY Left 04/12/2019   Procedure: TOTAL KNEE ARTHROPLASTY;  Surgeon: Lovell Sheehan, MD;  Location: [facility];  Service: Orthopedics;  Laterality: Left;   TOTAL KNEE ARTHROPLASTY Right 04/17/2020   Procedure: TOTAL KNEE ARTHROPLASTY;  Surgeon: Lovell Sheehan, MD;  Location: [facility];  Service: Orthopedics;  Laterality: Right;   TUBAL LIGATION      Current Medications: No outpatient medications have been marked as taking for the 01/19/23 encounter (Appointment) with Kathlen Mody, Tyia Binford H, PA-C.     Allergies:   Patient has no known allergies.   Social History   Socioeconomic History   Marital status: Married    Spouse name: Mikeal Hawthorne   Number of children: 4   Years of education: Not on file   Highest education level: 12th grade  Occupational History   Occupation: disabled    Comment: chronic back pain   Tobacco Use   Smoking status: Former    Packs/day: 1.00     Years: 20.00    Additional pack years: 0.00    Total pack years: 20.00    Types: Cigarettes    Start date: 07/26/2002    Quit date: 04/06/2019    Years since quitting: 3.7   Smokeless tobacco: Never   Tobacco comments:    Quit Date 04/06/2019  Vaping Use   Vaping Use: Never used  Substance and Sexual Activity   Alcohol use: Yes    Comment: occasional beer   Drug use: No   Sexual activity: Yes    Partners: Male    Birth control/protection: Other-see comments    Comment: Ablation  Other Topics Concern   Not on file  Social History Narrative   Not on file   Social Determinants of Health   Financial Resource Strain: Low Risk  (11/06/2021)   Overall Financial Resource Strain (CARDIA)    Difficulty of Paying Living Expenses: Not hard at all  Food Insecurity: No Food Insecurity (04/01/2022)   Hunger Vital Sign    Worried About Running Out of Food in the  Last Year: Never true    [place] in the Last Year: Never true  Transportation Needs: No Transportation Needs (04/01/2022)   PRAPARE - Hydrologist (Medical): No    Lack of Transportation (Non-Medical): No  Physical Activity: Inactive (11/06/2021)   Exercise Vital Sign    Days of Exercise per Week: 0 days    Minutes of Exercise per Session: 0 min  Stress: No Stress Concern Present (11/06/2021)   [place]    Feeling of Stress : Not at all  Social Connections: Moderately Isolated (11/06/2021)   Social Connection and Isolation Panel [NHANES]    Frequency of Communication with Friends and Family: More than three times a week    Frequency of Social Gatherings with Friends and Family: Three times a week    Attends Religious Services: Never    Active Member of Clubs or Organizations: No    Attends Archivist Meetings: Never    Marital Status: Married     Family History: The patient's ***family history includes Asthma in her  daughter; Breast cancer in her maternal grandmother; Congestive Heart Failure in her mother; Depression in her daughter; Diabetes in her maternal uncle; Heart disease in her mother; Heart failure in her maternal grandfather and maternal uncle; Hypertension in her father; Kidney disease in her maternal uncle; Thyroid cancer in her mother.  ROS:   Please see the history of present illness.    *** All other systems reviewed and are negative.  EKGs/Labs/Other Studies Reviewed:    The following studies were reviewed today: ***  EKG:  EKG is *** ordered today.  The ekg ordered today demonstrates ***  Recent Labs: 02/05/2022: TSH 0.90 02/18/2022: Magnesium 1.8 02/19/2022: B Natriuretic Peptide 271.9 03/02/2022: ALT 16; Hemoglobin 12.7; Platelets 348 09/23/2022: BUN 15; Creatinine, Ser 0.86; Potassium 3.8; Sodium 136  Recent Lipid Panel    Component Value Date/Time   CHOL 120 02/19/2022 0339   TRIG 258 (H) 02/19/2022 0339   HDL 34 (L) 02/19/2022 0339   CHOLHDL 3.5 02/19/2022 0339   VLDL 52 (H) 02/19/2022 0339   LDLCALC 34 02/19/2022 0339   LDLCALC 34 08/07/2020 1247     Risk Assessment/Calculations:   {Does this patient have ATRIAL FIBRILLATION?[phone]}   Physical Exam:    VS:  There were no vitals taken for this visit.    Wt Readings from Last 3 Encounters:  12/04/22 207 lb 12.8 oz (94.3 kg)  09/22/22 208 lb (94.3 kg)  08/11/22 211 lb (95.7 kg)     GEN: *** Well nourished, well developed in no acute distress HEENT: Normal NECK: No JVD; No carotid bruits LYMPHATICS: No lymphadenopathy CARDIAC: ***RRR, no murmurs, rubs, gallops RESPIRATORY:  Clear to auscultation without rales, wheezing or rhonchi  ABDOMEN: Soft, non-tender, non-distended MUSCULOSKELETAL:  No edema; No deformity  SKIN: Warm and dry NEUROLOGIC:  Alert and oriented x 3 PSYCHIATRIC:  Normal affect   ASSESSMENT:    No diagnosis found. PLAN:    In order of problems listed above:  ***  Disposition:  Follow up {follow up:15908} with ***   Shared Decision Making/Informed Consent   {Are you ordering a CV Procedure (e.g. stress test, cath, DCCV, TEE, etc)?   Press F2        :[id number]    Signed, Chayim Bialas Ninfa Meeker, PA-C  01/19/2023 7:59 AM    [facility]

## 2023-01-23 ENCOUNTER — Other Ambulatory Visit: Payer: Self-pay | Admitting: Family Medicine

## 2023-01-23 DIAGNOSIS — F339 Major depressive disorder, recurrent, unspecified: Secondary | ICD-10-CM

## 2023-01-25 NOTE — Telephone Encounter (Signed)
Tried to call pt to get her scheduled for an appt per dr Ancil Boozer. No answer and MB is full

## 2023-02-10 ENCOUNTER — Other Ambulatory Visit: Payer: Self-pay | Admitting: Medical

## 2023-02-16 ENCOUNTER — Other Ambulatory Visit: Payer: Self-pay | Admitting: Family Medicine

## 2023-02-16 DIAGNOSIS — F5104 Psychophysiologic insomnia: Secondary | ICD-10-CM

## 2023-02-16 DIAGNOSIS — G894 Chronic pain syndrome: Secondary | ICD-10-CM | POA: Diagnosis not present

## 2023-02-16 DIAGNOSIS — F339 Major depressive disorder, recurrent, unspecified: Secondary | ICD-10-CM

## 2023-02-16 DIAGNOSIS — Z79891 Long term (current) use of opiate analgesic: Secondary | ICD-10-CM | POA: Diagnosis not present

## 2023-02-16 NOTE — Progress Notes (Signed)
[facility] Quality Team Note  Name: [name] Date of Birth: Sep 14, 1961 MRN: [id number] Date: 02/16/2023  [facility] Quality Team has reviewed this patient's chart, please see recommendations below:  [facility] Quality Other; ([place]- CONTROLLING BLOOD PRESSURE. OUTREACH TO PATIENT TO SCHEDULE BLOOD PRESSURE CHECK. PATIENT DECLINED.)

## 2023-02-24 ENCOUNTER — Other Ambulatory Visit: Payer: Self-pay | Admitting: *Deleted

## 2023-02-24 DIAGNOSIS — Z122 Encounter for screening for malignant neoplasm of respiratory organs: Secondary | ICD-10-CM

## 2023-02-24 DIAGNOSIS — Z87891 Personal history of nicotine dependence: Secondary | ICD-10-CM

## 2023-03-09 ENCOUNTER — Other Ambulatory Visit: Payer: Self-pay | Admitting: Family Medicine

## 2023-03-09 DIAGNOSIS — F339 Major depressive disorder, recurrent, unspecified: Secondary | ICD-10-CM

## 2023-03-09 NOTE — Telephone Encounter (Signed)
Requested medications are due for refill today.  yes  Requested medications are on the active medications list.  yes  Last refill. 12/09/2022 #30 0 rf  Future visit scheduled.   no  Notes to clinic.  Labs are expired.    Requested Prescriptions  Pending Prescriptions Disp Refills   buPROPion (WELLBUTRIN XL) 150 MG 24 hr tablet [Pharmacy Med Name: BUPROPION XL 150MG  TABLETS (24 H)] 30 tablet 0    Sig: TAKE 1 TABLET(150 MG) BY MOUTH DAILY     Psychiatry: Antidepressants - bupropion Failed - 03/09/2023  5:38 PM      Failed - AST in normal range and within 360 days    AST  Date Value Ref Range Status  03/02/2022 19 10 - 35 U/L Final         Failed - ALT in normal range and within 360 days    ALT  Date Value Ref Range Status  03/02/2022 16 6 - 29 U/L Final         Failed - Last BP in normal range    BP Readings from Last 1 Encounters:  12/04/22 (!) 150/94         Passed - Cr in normal range and within 360 days    Creat  Date Value Ref Range Status  03/02/2022 0.63 0.50 - 1.03 mg/dL Final   Creatinine, Ser  Date Value Ref Range Status  09/23/2022 0.86 0.44 - 1.00 mg/dL Final         Passed - Completed PHQ-2 or PHQ-9 in the last 360 days      Passed - Valid encounter within last 6 months    Recent Outpatient Visits           4 months ago Medicare annual wellness visit, subsequent   [facility] Caro Laroche, DO   5 months ago COPD, mild [facility])    [facility] Walker, Danna Hefty, MD   11 months ago Diastolic heart failure, unspecified HF chronicity [facility])    [facility] Alba Cory, MD   1 year ago TIA (transient ischemic attack)   [facility] Danelle Berry, PA-C   1 year ago Acute nonintractable headache, unspecified headache type   [facility] Danelle Berry, [place]

## 2023-03-16 DIAGNOSIS — G894 Chronic pain syndrome: Secondary | ICD-10-CM | POA: Diagnosis not present

## 2023-03-16 DIAGNOSIS — Z79891 Long term (current) use of opiate analgesic: Secondary | ICD-10-CM | POA: Diagnosis not present

## 2023-03-18 ENCOUNTER — Other Ambulatory Visit: Payer: Self-pay | Admitting: Family Medicine

## 2023-03-18 DIAGNOSIS — F339 Major depressive disorder, recurrent, unspecified: Secondary | ICD-10-CM

## 2023-03-18 DIAGNOSIS — F5104 Psychophysiologic insomnia: Secondary | ICD-10-CM

## 2023-03-22 ENCOUNTER — Telehealth: Payer: Self-pay | Admitting: Cardiology

## 2023-03-22 NOTE — Telephone Encounter (Signed)
Pt c/o of Chest Pain: STAT if CP now or developed within 24 hours  1. Are you having CP right now?  No   2. Are you experiencing any other symptoms (ex. SOB, nausea, vomiting, sweating)?  SOB  3. How long have you been experiencing CP?  About 1 week  4. Is your CP continuous or coming and going?  Coming and going  5. Have you taken Nitroglycerin?  States she does not take nitro ?

## 2023-03-22 NOTE — Telephone Encounter (Signed)
Returned the call to the patient. She was calling to schedule her past due appointment follow up.   She stated that she was having some chest pain and shortness of breath intermittently when she ambulates. This does not happen on a regular basis and has not occurred in the last few days. The pain is in the middle of her chest and does not radiate.   An appointment has been scheduled for 5/30.   Patient made aware of ED precautions should new or worsening symptoms occur. Patient verbalized understanding.

## 2023-03-23 ENCOUNTER — Encounter: Payer: Self-pay | Admitting: Acute Care

## 2023-03-23 ENCOUNTER — Ambulatory Visit (INDEPENDENT_AMBULATORY_CARE_PROVIDER_SITE_OTHER): Payer: Medicare Other | Admitting: Acute Care

## 2023-03-23 DIAGNOSIS — Z87891 Personal history of nicotine dependence: Secondary | ICD-10-CM

## 2023-03-23 NOTE — Patient Instructions (Signed)
Thank you for participating in the [facility] Cancer Screening Program. It was our pleasure to meet you today. We will call you with the results of your scan within the next few days. Your scan will be assigned a Lung RADS category score by the physicians reading the scans.  This Lung RADS score determines follow up scanning.  See below for description of categories, and follow up screening recommendations. We will be in touch to schedule your follow up screening annually or based on recommendations of our providers. We will fax a copy of your scan results to your Primary Care Physician, or the physician who referred you to the program, to ensure they have the results. Please call the office if you have any questions or concerns regarding your scanning experience or results.  Our office number is [phone]. Please speak with Denise Phelps, RN. , or  Denise Buckner RN, They are  our Lung Cancer Screening RN.'s If They are unavailable when you call, Please leave a message on the voice mail. We will return your call at our earliest convenience.This voice mail is monitored several times a day.  Remember, if your scan is normal, we will scan you annually as long as you continue to meet the criteria for the program. (Age 50-80, Current smoker or smoker who has quit within the last 15 years). If you are a smoker, remember, quitting is the single most powerful action that you can take to decrease your risk of lung cancer and other pulmonary, breathing related problems. We know quitting is hard, and we are here to help.  Please let us know if there is anything we can do to help you meet your goal of quitting. If you are a former smoker, congratulations. We are proud of you! Remain smoke free! Remember you can refer friends or family members through the number above.  We will screen them to make sure they meet criteria for the program. Thank you for helping us take better care of you by  participating in Lung Screening.  You can receive free nicotine replacement therapy ( patches, gum or mints) by calling 1-800-QUIT NOW. Please call so we can get you on the path to becoming  a non-smoker. I know it is hard, but you can do this!  Lung RADS Categories:  Lung RADS 1: no nodules or definitely non-concerning nodules.  Recommendation is for a repeat annual scan in 12 months.  Lung RADS 2:  nodules that are non-concerning in appearance and behavior with a very low likelihood of becoming an active cancer. Recommendation is for a repeat annual scan in 12 months.  Lung RADS 3: nodules that are probably non-concerning , includes nodules with a low likelihood of becoming an active cancer.  Recommendation is for a 6-month repeat screening scan. Often noted after an upper respiratory illness. We will be in touch to make sure you have no questions, and to schedule your 6-month scan.  Lung RADS 4 A: nodules with concerning findings, recommendation is most often for a follow up scan in 3 months or additional testing based on our provider's assessment of the scan. We will be in touch to make sure you have no questions and to schedule the recommended 3 month follow up scan.  Lung RADS 4 B:  indicates findings that are concerning. We will be in touch with you to schedule additional diagnostic testing based on our provider's  assessment of the scan.  Other options for assistance in smoking cessation (   As covered by your insurance benefits)  Hypnosis for smoking cessation  [employer] [phone]  Acupuncture for smoking cessation  [facility] [phone]   

## 2023-03-23 NOTE — Progress Notes (Signed)
Virtual Visit via Telephone Note  I connected with Aggie Cosier on 03/23/23 at  2:00 PM EDT by telephone and verified that I am speaking with the correct person using two identifiers.  Location: Patient:  At home Provider: [address], [place], [place], [address]    I discussed the limitations, risks, security and privacy concerns of performing an evaluation and management service by telephone and the availability of in person appointments. I also discussed with the patient that there may be a patient responsible charge related to this service. The patient expressed understanding and agreed to proceed.    Shared Decision Making Visit Lung Cancer Screening Program [phone])   Eligibility: Age 62 y.o. Pack Years Smoking History Calculation 45 pack year smoking history (# packs/per year x # years smoked) Recent History of coughing up blood  no Unexplained weight loss? no ( >Than 15 pounds within the last 6 months ) Prior History Lung / other cancer no (Diagnosis within the last 5 years already requiring surveillance chest CT Scans). Smoking Status Former Smoker Former Smokers: Years since quit: < 1 year  Quit Date: 04/2022  Visit Components: Discussion included one or more decision making aids. yes Discussion included risk/benefits of screening. yes Discussion included potential follow up diagnostic testing for abnormal scans. yes Discussion included meaning and risk of over diagnosis. yes Discussion included meaning and risk of False Positives. yes Discussion included meaning of total radiation exposure. yes  Counseling Included: Importance of adherence to annual lung cancer LDCT screening. yes Impact of comorbidities on ability to participate in the program. yes Ability and willingness to under diagnostic treatment. yes  Smoking Cessation Counseling: Current Smokers:  Discussed importance of smoking cessation. yes Information about tobacco cessation classes and  interventions provided to patient. yes Patient provided with "ticket" for LDCT Scan. yes Symptomatic Patient. no  Counseling NA Diagnosis Code: Tobacco Use Z72.0 Asymptomatic Patient yes  Counseling (Intermediate counseling: > three minutes counseling) U0454 Former Smokers:  Discussed the importance of maintaining cigarette abstinence. yes Diagnosis Code: Personal History of Nicotine Dependence. U98.119 Information about tobacco cessation classes and interventions provided to patient. Yes Patient provided with "ticket" for LDCT Scan. yes Written Order for Lung Cancer Screening with LDCT placed in Epic. Yes (CT Chest Lung Cancer Screening Low Dose W/O CM) [id number] Z12.2-Screening of respiratory organs Z87.891-Personal history of nicotine dependence  I spent 25 minutes of face to face time/virtual visit time  with Ms. Mackowski discussing the risks and benefits of lung cancer screening. We took the time to pause the power point at intervals to allow for questions to be asked and answered to ensure understanding. We discussed that she had taken the single most powerful action possible to decrease her risk of developing lung cancer when she quit smoking. I counseled her to remain smoke free, and to contact me if me  ever had the desire to smoke again so that I can provide resources and tools to help support the effort to remain smoke free. We discussed the time and location of the scan, and that either  Abigail Miyamoto RN, Karlton Lemon, RN or I  or I will call / send a letter with the results within  24-72 hours of receiving them. She has the office contact information in the event she needs to speak with me,  she verbalized understanding of all of the above and had no further questions upon leaving the office.     I explained to the patient that  there has been a high incidence of coronary artery disease noted on these exams. I explained that this is a non-gated exam therefore degree or severity cannot  be determined. This patient is on statin therapy. I have asked the patient to follow-up with their PCP regarding any incidental finding of coronary artery disease and management with diet or medication as they feel is clinically indicated. The patient verbalized understanding of the above and had no further questions.   Pt. Was not feeling well. We will reschedule her CT Chest. She was describing chest pain and fatigue. I really tried to encourage her to seek emergency care. She stated her daughter is with her and that she will seek emergency care if she gets any worse. She has a family and personal  history of heart disease. Again, I encouraged her to seek emergency care.   Bevelyn Ngo, NP 03/23/2023

## 2023-03-24 ENCOUNTER — Ambulatory Visit: Admission: RE | Admit: 2023-03-24 | Payer: Medicare Other | Source: Ambulatory Visit

## 2023-03-31 DIAGNOSIS — M25561 Pain in right knee: Secondary | ICD-10-CM | POA: Diagnosis not present

## 2023-03-31 DIAGNOSIS — S8392XA Sprain of unspecified site of left knee, initial encounter: Secondary | ICD-10-CM | POA: Diagnosis not present

## 2023-04-01 ENCOUNTER — Encounter: Payer: Self-pay | Admitting: Cardiology

## 2023-04-01 ENCOUNTER — Ambulatory Visit: Payer: Medicare Other | Attending: Cardiology | Admitting: Cardiology

## 2023-04-02 ENCOUNTER — Other Ambulatory Visit: Payer: Self-pay | Admitting: Family Medicine

## 2023-04-02 DIAGNOSIS — B001 Herpesviral vesicular dermatitis: Secondary | ICD-10-CM

## 2023-04-14 DIAGNOSIS — S8392XA Sprain of unspecified site of left knee, initial encounter: Secondary | ICD-10-CM | POA: Diagnosis not present

## 2023-04-15 ENCOUNTER — Ambulatory Visit
Admission: RE | Admit: 2023-04-15 | Discharge: 2023-04-15 | Disposition: A | Discharge status: Home / Self Care | Payer: Medicare Other | Source: Ambulatory Visit | Attending: Acute Care | Admitting: Acute Care

## 2023-04-15 DIAGNOSIS — Z87891 Personal history of nicotine dependence: Secondary | ICD-10-CM | POA: Diagnosis not present

## 2023-04-15 DIAGNOSIS — Z122 Encounter for screening for malignant neoplasm of respiratory organs: Secondary | ICD-10-CM | POA: Insufficient documentation

## 2023-04-16 ENCOUNTER — Other Ambulatory Visit: Payer: Self-pay | Admitting: Family Medicine

## 2023-04-16 DIAGNOSIS — M5136 Other intervertebral disc degeneration, lumbar region: Secondary | ICD-10-CM | POA: Diagnosis not present

## 2023-04-16 DIAGNOSIS — F5104 Psychophysiologic insomnia: Secondary | ICD-10-CM

## 2023-04-16 DIAGNOSIS — M170 Bilateral primary osteoarthritis of knee: Secondary | ICD-10-CM | POA: Diagnosis not present

## 2023-04-16 DIAGNOSIS — B001 Herpesviral vesicular dermatitis: Secondary | ICD-10-CM

## 2023-04-16 DIAGNOSIS — M25511 Pain in right shoulder: Secondary | ICD-10-CM | POA: Diagnosis not present

## 2023-04-16 DIAGNOSIS — M79661 Pain in right lower leg: Secondary | ICD-10-CM | POA: Diagnosis not present

## 2023-04-16 DIAGNOSIS — G894 Chronic pain syndrome: Secondary | ICD-10-CM | POA: Diagnosis not present

## 2023-04-16 DIAGNOSIS — M25561 Pain in right knee: Secondary | ICD-10-CM | POA: Diagnosis not present

## 2023-04-16 DIAGNOSIS — M25562 Pain in left knee: Secondary | ICD-10-CM | POA: Diagnosis not present

## 2023-04-16 DIAGNOSIS — Z79891 Long term (current) use of opiate analgesic: Secondary | ICD-10-CM | POA: Diagnosis not present

## 2023-04-16 DIAGNOSIS — F339 Major depressive disorder, recurrent, unspecified: Secondary | ICD-10-CM

## 2023-04-16 DIAGNOSIS — M25512 Pain in left shoulder: Secondary | ICD-10-CM | POA: Diagnosis not present

## 2023-04-16 DIAGNOSIS — M542 Cervicalgia: Secondary | ICD-10-CM | POA: Diagnosis not present

## 2023-04-16 DIAGNOSIS — M25551 Pain in right hip: Secondary | ICD-10-CM | POA: Diagnosis not present

## 2023-04-16 DIAGNOSIS — Z79899 Other long term (current) drug therapy: Secondary | ICD-10-CM | POA: Diagnosis not present

## 2023-04-16 DIAGNOSIS — M25552 Pain in left hip: Secondary | ICD-10-CM | POA: Diagnosis not present

## 2023-04-16 DIAGNOSIS — M79662 Pain in left lower leg: Secondary | ICD-10-CM | POA: Diagnosis not present

## 2023-04-16 MED ORDER — VALACYCLOVIR HCL 1 G PO TABS
ORAL_TABLET | ORAL | 0 refills | Status: DC
Start: 2023-04-16 — End: 2023-05-12

## 2023-04-16 NOTE — Telephone Encounter (Signed)
Medication Refill - Medication: valACYclovir (VALTREX) 1000 MG tablet [[id number]]   QUEtiapine (SEROQUEL) 25 MG tablet [[id number]]   Has the patient contacted their pharmacy? Yes.   (Agent: If no, request that the patient contact the pharmacy for the refill. If patient does not wish to contact the pharmacy document the reason why and proceed with request.) (Agent: If yes, when and what did the pharmacy advise?)  Preferred Pharmacy (with phone number or street name): [id number] [place]ree Ditto, [place] - [address] AT [facility] OF [address] [facility] Phone: [phone]  Fax: [phone]   Has the patient been seen for an appointment in the last year OR does the patient have an upcoming appointment? Yes.    Agent: Please be advised that RX refills may take up to 3 business days. We ask that you follow-up with your pharmacy.

## 2023-04-16 NOTE — Telephone Encounter (Signed)
Requested medication (s) are due for refill today: yes  Requested medication (s) are on the active medication list:yes  Last refill:  09/22/22 #90 1 RF  Future visit scheduled: no  Notes to clinic:  med not delegated to NT to RF   Requested Prescriptions  Pending Prescriptions Disp Refills   QUEtiapine (SEROQUEL) 25 MG tablet 90 tablet     Sig: Take 1 tablet (25 mg total) by mouth at bedtime.     Not Delegated - Psychiatry:  Antipsychotics - Second Generation (Atypical) - quetiapine Failed - 04/16/2023  4:06 PM      Failed - This refill cannot be delegated      Failed - TSH in normal range and within 360 days    TSH  Date Value Ref Range Status  02/05/2022 0.90 0.40 - 4.50 mIU/L Final         Failed - Last BP in normal range    BP Readings from Last 1 Encounters:  12/04/22 (!) 150/94         Failed - Lipid Panel in normal range within the last 12 months    Cholesterol  Date Value Ref Range Status  02/19/2022 120 0 - 200 mg/dL Final   LDL Cholesterol (Calc)  Date Value Ref Range Status  08/07/2020 34 mg/dL (calc) Final    Comment:    Reference range: <100 . Desirable range <100 mg/dL for primary prevention;   <70 mg/dL for patients with CHD or diabetic patients  with > or = 2 CHD risk factors. Marland Kitchen LDL-C is now calculated using the Martin-Hopkins  calculation, which is a validated novel method providing  better accuracy than the Friedewald equation in the  estimation of LDL-C.  Horald Pollen et al. Lenox Ahr. 5621;308(65): 2061-2068  (http://education.[employer].com/faq/FAQ164)    LDL Cholesterol  Date Value Ref Range Status  02/19/2022 34 0 - 99 mg/dL Final    Comment:           Total Cholesterol/HDL:CHD Risk Coronary Heart Disease Risk Table                     Men   Women  1/2 Average Risk   3.4   3.3  Average Risk       5.0   4.4  2 X Average Risk   9.6   7.1  3 X Average Risk  23.4   11.0        Use the calculated Patient Ratio above and the CHD Risk  Table to determine the patient's CHD Risk.        ATP III CLASSIFICATION (LDL):  <100     mg/dL   Optimal  784-696  mg/dL   Near or Above                    Optimal  130-159  mg/dL   Borderline  295-284  mg/dL   High  >132     mg/dL   Very High Performed at [facility], [address] [place], [place] [address]    HDL  Date Value Ref Range Status  02/19/2022 34 (L) >40 mg/dL Final   Triglycerides  Date Value Ref Range Status  02/19/2022 258 (H) <150 mg/dL Final         Failed - CBC within normal limits and completed in the last 12 months    WBC  Date Value Ref Range Status  03/02/2022 9.2 3.8 - 10.8 Thousand/uL Final  RBC  Date Value Ref Range Status  03/02/2022 4.19 3.80 - 5.10 Million/uL Final   Hemoglobin  Date Value Ref Range Status  03/02/2022 12.7 11.7 - 15.5 g/dL Final   HCT  Date Value Ref Range Status  03/02/2022 38.7 35.0 - 45.0 % Final   MCHC  Date Value Ref Range Status  03/02/2022 32.8 32.0 - 36.0 g/dL Final   [facility]  Date Value Ref Range Status  03/02/2022 30.3 27.0 - 33.0 pg Final   MCV  Date Value Ref Range Status  03/02/2022 92.4 80.0 - 100.0 fL Final   No results found for: "PLTCOUNTKUC", "LABPLAT", "POCPLA" RDW  Date Value Ref Range Status  03/02/2022 12.3 11.0 - 15.0 % Final         Failed - CMP within normal limits and completed in the last 12 months    Albumin  Date Value Ref Range Status  02/18/2022 3.4 (L) 3.5 - 5.0 g/dL Final   Alkaline Phosphatase  Date Value Ref Range Status  02/18/2022 82 38 - 126 U/L Final   Alkaline phosphatase (APISO)  Date Value Ref Range Status  03/02/2022 112 37 - 153 U/L Final   ALT  Date Value Ref Range Status  03/02/2022 16 6 - 29 U/L Final   AST  Date Value Ref Range Status  03/02/2022 19 10 - 35 U/L Final   BUN  Date Value Ref Range Status  09/23/2022 15 6 - 20 mg/dL Final   Calcium  Date Value Ref Range Status  09/23/2022 10.4 (H) 8.9 - 10.3 mg/dL Final   CO2   Date Value Ref Range Status  09/23/2022 23 22 - 32 mmol/L Final   Creat  Date Value Ref Range Status  03/02/2022 0.63 0.50 - 1.03 mg/dL Final   Creatinine, Ser  Date Value Ref Range Status  09/23/2022 0.86 0.44 - 1.00 mg/dL Final   Glucose, Bld  Date Value Ref Range Status  09/23/2022 100 (H) 70 - 99 mg/dL Final    Comment:    Glucose reference range applies only to samples taken after fasting for at least 8 hours.   Glucose-Capillary  Date Value Ref Range Status  02/18/2022 121 (H) 70 - 99 mg/dL Final    Comment:    Glucose reference range applies only to samples taken after fasting for at least 8 hours.   Potassium  Date Value Ref Range Status  09/23/2022 3.8 3.5 - 5.1 mmol/L Final   Sodium  Date Value Ref Range Status  09/23/2022 136 135 - 145 mmol/L Final   Total Bilirubin  Date Value Ref Range Status  03/02/2022 0.5 0.2 - 1.2 mg/dL Final   Protein, ur  Date Value Ref Range Status  01/01/2021 NEGATIVE NEGATIVE mg/dL Final   Protein, UA  Date Value Ref Range Status  02/05/2022 Positive (A) Negative Final    Comment:    100+   Total Protein  Date Value Ref Range Status  03/02/2022 7.5 6.1 - 8.1 g/dL Final   GFR, Est African American  Date Value Ref Range Status  08/07/2020 118 > OR = 60 mL/min/1.34m2 Final   eGFR  Date Value Ref Range Status  03/02/2022 102 > OR = 60 mL/min/1.12m2 Final    Comment:    The eGFR is based on the CKD-EPI 2021 equation. To calculate  the new eGFR from a previous Creatinine or Cystatin C result, go to https://www.kidney.org/professionals/ kdoqi/gfr%5Fcalculator    GFR, Est Non African American  Date Value Ref Range  Status  08/07/2020 102 > OR = 60 mL/min/1.70m2 Final   GFR, Estimated  Date Value Ref Range Status  09/23/2022 >60 >60 mL/min Final    Comment:    (NOTE) Calculated using the CKD-EPI Creatinine Equation (2021)          Passed - Completed PHQ-2 or PHQ-9 in the last 360 days      Passed - Last  Heart Rate in normal range    Pulse Readings from Last 1 Encounters:  12/04/22 (!) 106         Passed - Valid encounter within last 6 months    Recent Outpatient Visits           5 months ago Medicare annual wellness visit, subsequent   [facility] Dense M, DO   6 months ago COPD, mild [facility])   [place] [place], Danna Hefty, MD   1 year ago Diastolic heart failure, unspecified HF chronicity [facility])   [place] Alba Cory, MD   1 year ago TIA (transient ischemic attack)   [facility] Danelle Berry, PA-C   1 year ago Acute nonintractable headache, unspecified headache type   [facility] Danelle Berry, PA-C       Future Appointments             In 1 month Agbor-Etang, Arlys John, MD [facility]            Signed Prescriptions Disp Refills   valACYclovir (VALTREX) 1000 MG tablet 10 tablet 0    Sig: TAKE 1 TABLET(1000 MG) BY MOUTH TWICE DAILY AS NEEDED     Antimicrobials:  Antiviral Agents - Anti-Herpetic Passed - 04/16/2023  4:06 PM      Passed - Valid encounter within last 12 months    Recent Outpatient Visits           5 months ago Medicare annual wellness visit, subsequent   [facility] Caro Laroche, DO   6 months ago COPD, mild [facility])   [place] [facility] [place], Danna Hefty, MD   1 year ago Diastolic heart failure, unspecified HF chronicity [facility])   [facility] Alba Cory, MD   1 year ago TIA (transient ischemic attack)   [facility] Danelle Berry, PA-C   1 year ago Acute nonintractable headache, unspecified headache type   [facility] Danelle Berry, PA-C       Future Appointments             In 1 month Agbor-Etang, Arlys John, MD [facility]

## 2023-04-19 ENCOUNTER — Other Ambulatory Visit: Payer: Self-pay | Admitting: Family Medicine

## 2023-04-19 DIAGNOSIS — F339 Major depressive disorder, recurrent, unspecified: Secondary | ICD-10-CM

## 2023-04-19 DIAGNOSIS — F5104 Psychophysiologic insomnia: Secondary | ICD-10-CM

## 2023-04-19 NOTE — Telephone Encounter (Signed)
Medication Refill - Medication: desvenlafaxine (PRISTIQ) 50 MG 24 hr tablet  QUEtiapine (SEROQUEL) 25 MG tablet   She says the pharmacy has reached out already and because there has been no response they are not reaching out anymore.   Has the patient contacted their pharmacy? Yes.   (Agent: If no, request that the patient contact the pharmacy for the refill. If patient does not wish to contact the pharmacy document the reason why and proceed with request.) (Agent: If yes, when and what did the pharmacy advise?)  Preferred Pharmacy (with phone number or street name):  [facility] #[id number] Cheree Ditto, [place] - [address] AT [facility] OF [address] [facility]  [address] [place] [address]  Phone: [phone] Fax: [phone]   Has the patient been seen for an appointment in the last year OR does the patient have an upcoming appointment? Yes.    Agent: Please be advised that RX refills may take up to 3 business days. We ask that you follow-up with your pharmacy.

## 2023-04-19 NOTE — Telephone Encounter (Signed)
Request for Quetiapine already sent to the provider on 04/19/23 in the refill encounter 04/16/23, pending approval.

## 2023-04-19 NOTE — Telephone Encounter (Signed)
Requested medication (s) are due for refill today: yes  Requested medication (s) are on the active medication list: yes  Last refill:  09/22/22 #90/1  Future visit scheduled: yes 04/22/23  Notes to clinic:  Unable to refill per protocol due to failed labs, no updated results. Called and pt agreed to schedule FU OV, 04/22/23 with Sheliah Mends, PA    Requested Prescriptions  Pending Prescriptions Disp Refills   desvenlafaxine (PRISTIQ) 50 MG 24 hr tablet 90 tablet 1    Sig: Take 1 tablet (50 mg total) by mouth daily.     Psychiatry: Antidepressants - SNRI - desvenlafaxine & venlafaxine Failed - 04/19/2023  1:41 PM      Failed - Last BP in normal range    BP Readings from Last 1 Encounters:  12/04/22 (!) 150/94         Failed - Lipid Panel in normal range within the last 12 months    Cholesterol  Date Value Ref Range Status  02/19/2022 120 0 - 200 mg/dL Final   LDL Cholesterol (Calc)  Date Value Ref Range Status  08/07/2020 34 mg/dL (calc) Final    Comment:    Reference range: <100 . Desirable range <100 mg/dL for primary prevention;   <70 mg/dL for patients with CHD or diabetic patients  with > or = 2 CHD risk factors. Marland Kitchen LDL-C is now calculated using the Martin-Hopkins  calculation, which is a validated novel method providing  better accuracy than the Friedewald equation in the  estimation of LDL-C.  Horald Pollen et al. Lenox Ahr. 4098;119(14): 2061-2068  (http://education.[facility].com/faq/FAQ164)    LDL Cholesterol  Date Value Ref Range Status  02/19/2022 34 0 - 99 mg/dL Final    Comment:           Total Cholesterol/HDL:CHD Risk Coronary Heart Disease Risk Table                     Men   Women  1/2 Average Risk   3.4   3.3  Average Risk       5.0   4.4  2 X Average Risk   9.6   7.1  3 X Average Risk  23.4   11.0        Use the calculated Patient Ratio above and the CHD Risk Table to determine the patient's CHD Risk.        ATP III CLASSIFICATION (LDL):  <100      mg/dL   Optimal  782-956  mg/dL   Near or Above                    Optimal  130-159  mg/dL   Borderline  213-086  mg/dL   High  >578     mg/dL   Very High Performed at [facility], [address] [place], [place] [address]    HDL  Date Value Ref Range Status  02/19/2022 34 (L) >40 mg/dL Final   Triglycerides  Date Value Ref Range Status  02/19/2022 258 (H) <150 mg/dL Final         Passed - Cr in normal range and within 360 days    Creat  Date Value Ref Range Status  03/02/2022 0.63 0.50 - 1.03 mg/dL Final   Creatinine, Ser  Date Value Ref Range Status  09/23/2022 0.86 0.44 - 1.00 mg/dL Final         Passed - Completed PHQ-2 or PHQ-9 in the last 360 days  Passed - Valid encounter within last 6 months    Recent Outpatient Visits           5 months ago Medicare annual wellness visit, subsequent   [facility] Caro Laroche, DO   6 months ago COPD, mild [facility])   [facility] Alba Cory, MD   1 year ago Diastolic heart failure, unspecified HF chronicity [facility])   [facility] Alba Cory, MD   1 year ago TIA (transient ischemic attack)   [facility] Danelle Berry, PA-C   1 year ago Acute nonintractable headache, unspecified headache type   [facility] Danelle Berry, PA-C       Future Appointments             In 3 days Danelle Berry, PA-C [facility]   In 4 weeks Debbe Odea, MD [facility]

## 2023-04-21 NOTE — Progress Notes (Signed)
Name: [name]   MRN: [id number]    DOB: [birth year]   Date:04/21/2023       Progress Note  Pt did not show up for appt, cancelled prior to appt time   Subjective:   [name] is a 62 y.o. female, presents to clinic for routine f/up Pt of Dr. Carlynn Purl   Metabolic syndrome:  She denies polyphagia, polydipsia or polyuria.  Continue life style modification, she is taking Metformin occasionally only . offered to recheck it today but she would like to hold off for now    Lab Results  Component Value Date   HGBA1C 6.4 (H) 02/18/2022    HTN: bp today is at goal, she denies chest pain or palpitation  BP Readings from Last 3 Encounters:  12/04/22 (!) 150/94  09/22/22 122/74  08/11/22 (!) 140/86      Depression Major: long history of depression, got worse when mother died in 06/04/17. She states holiday season is hard. She is still on Seroquel and Pristiq . She is not seeing a psychiatrist . She states current dose of medications is fine     11/09/2022    1:12 PM 09/22/2022    8:48 AM 03/19/2022   10:06 AM 03/02/2022    2:40 PM 02/05/2022    8:59 AM  Depression screen PHQ 2/9  Decreased Interest 1 1 1  0 0  Down, Depressed, Hopeless 1 1 1  0 0  PHQ - 2 Score 2 2 2  0 0  Altered sleeping 2 3 1  0 0  Tired, decreased energy 1 0 1 0 0  Change in appetite 0 0 0 0 0  Feeling bad or failure about yourself  0 0 0 0 0  Trouble concentrating 0 0 0 0 0  Moving slowly or fidgety/restless 0 0 0 0 0  Suicidal thoughts 0 0 0 0 0  PHQ-9 Score 5 5 4  0 0  Difficult doing work/chores Somewhat difficult  Somewhat difficult Not difficult at all Not difficult at all      COPD: She quit smoking 04/2019   She denies any wheezing , she has a dry cough. She has SOB with mild activity now but that is new - likely from CHF. She is not taking any medication. Discussed lung cancer screening but she is not interested    IBS:she still has intermittent diarrhea and constipation. She states symptoms  controlled with medication - Elavil    GERD: going to see Dr. Allegra Lai for EGD, she still  has intermittent pain on epigastric area and asked for refill of pantoprazole , I  reached out to Dr .Allegra Lai, she recommended to go down to 40 mg once a day for one month and stop it    Atherosclerosis of aorta: she has been taking Atorvastatin and aspirin , denies side effects    CHF diastolic and pulmonary hypertension: found on Echo done 01/2022 , still under the care of cardiologist  She has orthopnea and uses two pillows and stable, she has intermittent lower extremity edema. On ARB and spironolactone    Left achilles tendinitis: had surgery on May 2023 , she states gradually improving   History of TIA: she went to [facility] on 02/18/2022 due to left facial droop and fatigue. Evaluation was negative, found to have anemia , had negative holter monitor    B12 deficiency: discussed SL form to take at least 3 times a week.    Chronic pain: going to the pain clinic and  takes medications as prescribed   Fever blisters: takes Valtrex about once a month . Unchanged     Current Outpatient Medications:    amitriptyline (ELAVIL) 10 MG tablet, Take 1 tablet (10 mg total) by mouth at bedtime., Disp: 90 tablet, Rfl: 1   amLODipine (NORVASC) 5 MG tablet, Take 1 tablet (5 mg total) by mouth daily., Disp: 90 tablet, Rfl: 1   aspirin EC 81 MG EC tablet, Take 1 tablet (81 mg total) by mouth daily. Swallow whole., Disp: 30 tablet, Rfl: 11   atorvastatin (LIPITOR) 40 MG tablet, Take 1 tablet (40 mg total) by mouth at bedtime., Disp: 90 tablet, Rfl: 1   buPROPion (WELLBUTRIN XL) 150 MG 24 hr tablet, TAKE 1 TABLET(150 MG) BY MOUTH DAILY, Disp: 30 tablet, Rfl: 0   carvedilol (COREG) 6.25 MG tablet, Take 1 tablet (6.25 mg total) by mouth 2 (two) times daily., Disp: 180 tablet, Rfl: 0   cholecalciferol (VITAMIN D) 1000 units tablet, Take 1,000 Units by mouth daily., Disp: , Rfl:    Cyanocobalamin (B-12) 1000 MCG SUBL, Place 1  each under the tongue 3 (three) times a week., Disp: , Rfl:    desvenlafaxine (PRISTIQ) 50 MG 24 hr tablet, Take 1 tablet (50 mg total) by mouth daily., Disp: 90 tablet, Rfl: 1   gabapentin (NEURONTIN) 300 MG capsule, Take 300 mg by mouth every 6 (six) hours as needed (Nerve pain)., Disp: , Rfl:    losartan-hydrochlorothiazide (HYZAAR) 100-25 MG tablet, Take 1 tablet by mouth daily., Disp: 90 tablet, Rfl: 1   metFORMIN (GLUCOPHAGE) 500 MG tablet, Take 1 tablet (500 mg total) by mouth daily with breakfast., Disp: 90 tablet, Rfl: 1   Multiple Vitamins-Minerals (MULTIVITAMIN WITH MINERALS) tablet, Take 1 tablet by mouth every other day., Disp: , Rfl:    NARCAN 4 MG/0.1ML LIQD nasal spray kit, Place 1 spray into the nose once., Disp: , Rfl:    Oxycodone HCl 20 MG TABS, Take 20 mg by mouth every 4 (four) hours as needed (pain)., Disp: , Rfl:    pantoprazole (PROTONIX) 40 MG tablet, Take 1 tablet (40 mg total) by mouth daily. (Patient not taking: Reported on 12/04/2022), Disp: 30 tablet, Rfl: 0   QUEtiapine (SEROQUEL) 25 MG tablet, Take 1 tablet (25 mg total) by mouth at bedtime., Disp: 90 tablet, Rfl: 1   spironolactone (ALDACTONE) 25 MG tablet, TAKE 1/2 TABLET(12.5 MG) BY MOUTH DAILY, Disp: 45 tablet, Rfl: 1   tiZANidine (ZANAFLEX) 4 MG tablet, Take 4 mg by mouth every 8 (eight) hours., Disp: , Rfl:    valACYclovir (VALTREX) 1000 MG tablet, TAKE 1 TABLET(1000 MG) BY MOUTH TWICE DAILY AS NEEDED, Disp: 10 tablet, Rfl: 0  Patient Active Problem List   Diagnosis Date Noted   Chronic GERD    Gastric erosion    Pulmonary hypertension (HCC) 03/19/2022   Diastolic heart failure (HCC) 03/19/2022   Other abnormalities of gait and mobility 03/02/2022   Obesity (BMI 30-39.9) 02/19/2022   Prediabetes 02/19/2022   Sinus bradycardia 02/19/2022   TIA (transient ischemic attack) 02/18/2022   Chronic pain 02/18/2022   Prolonged QT syndrome 02/05/2022   Status post left breast lumpectomy 01/23/2021   History of  total knee arthroplasty, right 04/17/2020   S/P TKR (total knee replacement) using cement, left 04/12/2019   Benign neoplasm of ascending colon    Diverticulosis of large intestine without diverticulitis    Pancreatic lesion 04/21/2018   Abdominal aortic atherosclerosis (HCC) 04/21/2018   Acute peptic ulcer of  stomach    Trigger thumb of right hand 02/25/2018   Carpal tunnel syndrome on both sides 10/21/2017   Elevated C-reactive protein 07/28/2017   Osteoarthritis of both knees 02/19/2016   COPD, mild (HCC) 05/08/2015   Vitamin D deficiency 05/07/2015   Metabolic syndrome 05/07/2015   Eczema 05/07/2015   Chronic insomnia 05/07/2015   Hypertension, benign 05/07/2015   Dyslipidemia 05/07/2015   Hyperglycemia 05/07/2015   Chronic low back pain 05/07/2015   Irritable bowel syndrome with both constipation and diarrhea 05/07/2015   Gastroesophageal reflux disease without esophagitis 05/07/2015   Major depression, recurrent, chronic (HCC) 05/07/2015   Menopausal syndrome (hot flashes) 05/07/2015   History of postmenopausal bleeding 01/14/2015    Past Surgical History:  Procedure Laterality Date   ACHILLES TENDON SURGERY Left 01/02/2022   Procedure: ACHILLES TENDON REPAIR-SECONDARY;  Surgeon: Gwyneth Revels, DPM;  Location: [facility] ORS;  Service: Podiatry;  Laterality: Left;   BACK SURGERY     BREAST EXCISIONAL BIOPSY Left ?   neg   BREAST LUMPECTOMY Left 01/08/2021   Procedure: BREAST LUMPECTOMY;  Surgeon: Campbell Lerner, MD;  Location: [facility] OR[facility];  Service: General;  Laterality: Left;   CARPAL TUNNEL RELEASE Left 08/15/2018   Procedure: CARPAL TUNNEL RELEASE;  Surgeon: Deeann Saint, MD;  Location: [facility] OR[facility];  Service: Orthopedics;  Laterality: Left;   CERVICAL DISCECTOMY     x 2; metal plate   CHOLECYSTECTOMY     COLONOSCOPY WITH PROPOFOL N/A 08/08/2018   Procedure: COLONOSCOPY WITH PROPOFOL;  Surgeon: Pasty Spillers, MD;  Location: [facility] ENDOSCOPY;  Service: Endoscopy;   Laterality: N/A;   COLONOSCOPY WITH PROPOFOL N/A 12/01/2021   Procedure: COLONOSCOPY WITH PROPOFOL;  Surgeon: Toney Reil, MD;  Location: [facility] ENDOSCOPY;  Service: Gastroenterology;  Laterality: N/A;   DILATATION & CURETTAGE/HYSTEROSCOPY WITH MYOSURE N/A 03/21/2015   Procedure: DILATATION & CURETTAGE/HYSTEROSCOPY;  Surgeon: Kouts Bing, MD;  Location: [facility];  Service: Gynecology;  Laterality: N/A;   DILATION AND CURETTAGE OF UTERUS     ENDOMETRIAL ABLATION     ESOPHAGOGASTRODUODENOSCOPY (EGD) WITH PROPOFOL N/A 04/13/2018   Procedure: ESOPHAGOGASTRODUODENOSCOPY (EGD) WITH PROPOFOL;  Surgeon: Pasty Spillers, MD;  Location: [facility] ENDOSCOPY;  Service: Endoscopy;  Laterality: N/A;   ESOPHAGOGASTRODUODENOSCOPY (EGD) WITH PROPOFOL N/A 08/08/2018   Procedure: ESOPHAGOGASTRODUODENOSCOPY (EGD) WITH PROPOFOL;  Surgeon: Pasty Spillers, MD;  Location: [facility] ENDOSCOPY;  Service: Endoscopy;  Laterality: N/A;   ESOPHAGOGASTRODUODENOSCOPY (EGD) WITH PROPOFOL N/A 03/25/2022   Procedure: ESOPHAGOGASTRODUODENOSCOPY (EGD) WITH PROPOFOL;  Surgeon: Toney Reil, MD;  [facility];  Service: Gastroenterology;  Laterality: N/A;   ESOPHAGOGASTRODUODENOSCOPY (EGD) WITH PROPOFOL N/A 03/27/2022   Procedure: ESOPHAGOGASTRODUODENOSCOPY (EGD) WITH PROPOFOL;  Surgeon: Toney Reil, MD;  Location: [facility] END[facility]COPY;  Service: Gastroenterology;  Laterality: N/A;   JOINT REPLACEMENT     OSTECTOMY Left 01/02/2022   Procedure: CALCANEAL EXOSTECTOMY;  Surgeon: Gwyneth Revels, DPM;  Location: [facility];  Service: Podiatry;  Laterality: Left;   SPINAL FUSION     lumbar x2   TONSILLECTOMY     TOTAL KNEE ARTHROPLASTY Left 04/12/2019   Procedure: TOTAL KNEE ARTHROPLASTY;  Surgeon: Lyndle Herrlich, MD;  Location: [facility];  Service: Orthopedics;  Laterality: Left;   TOTAL KNEE ARTHROPLASTY Right 04/17/2020   Procedure: TOTAL KNEE ARTHROPLASTY;  Surgeon: Lyndle Herrlich, MD;  Location: [facility] OR[facility];   Service: Orthopedics;  Laterality: Right;   TUBAL LIGATION      Family History  Problem Relation Age of Onset   Heart disease Mother  Thyroid cancer Mother    Congestive Heart Failure Mother    Hypertension Father    Heart failure Maternal Uncle    Diabetes Maternal Uncle    Kidney disease Maternal Uncle    Breast cancer Maternal Grandmother    Heart failure Maternal Grandfather    Depression Daughter    Asthma Daughter     Social History   Tobacco Use   Smoking status: Former    Packs/day: 1.00    Years: 20.00    Additional pack years: 0.00    Total pack years: 20.00    Types: Cigarettes    Start date: 07/26/2002    Quit date: 04/06/2019    Years since quitting: 4.0   Smokeless tobacco: Former    Quit date: 04/05/2022   Tobacco comments:    Quit Date 04/05/2022  Vaping Use   Vaping Use: Never used  Substance Use Topics   Alcohol use: Yes    Comment: occasional beer   Drug use: No     No Known Allergies  Health Maintenance  Topic Date Due   DTaP/Tdap/Td (1 - Tdap) Never done   MAMMOGRAM  12/31/2021   COVID-19 Vaccine (5 - 2023-24 season) 07/03/2022   INFLUENZA VACCINE  06/03/2023   PAP SMEAR-Modifier  08/04/2023   Medicare Annual Wellness (AWV)  11/10/2023   Lung Cancer Screening  04/14/2024   Colonoscopy  12/02/2031   Hepatitis C Screening  Completed   HIV Screening  Completed   Zoster Vaccines- Shingrix  Completed   HPV VACCINES  Aged Out    Chart Review Today:  Review of Systems   Objective:   There were no vitals filed for this visit.  There is no height or weight on file to calculate BMI.  Physical Exam      Assessment & Plan:   Problem List Items Addressed This Visit   None    No follow-ups on file.   Danelle Berry, PA-C 04/21/23 7:23 PM

## 2023-04-22 ENCOUNTER — Ambulatory Visit (INDEPENDENT_AMBULATORY_CARE_PROVIDER_SITE_OTHER): Payer: Medicare Other | Admitting: Family Medicine

## 2023-04-22 ENCOUNTER — Other Ambulatory Visit: Payer: Self-pay

## 2023-04-22 DIAGNOSIS — J449 Chronic obstructive pulmonary disease, unspecified: Secondary | ICD-10-CM

## 2023-04-22 DIAGNOSIS — F339 Major depressive disorder, recurrent, unspecified: Secondary | ICD-10-CM

## 2023-04-22 DIAGNOSIS — E538 Deficiency of other specified B group vitamins: Secondary | ICD-10-CM

## 2023-04-22 DIAGNOSIS — Z5181 Encounter for therapeutic drug level monitoring: Secondary | ICD-10-CM

## 2023-04-22 DIAGNOSIS — F5104 Psychophysiologic insomnia: Secondary | ICD-10-CM

## 2023-04-22 DIAGNOSIS — N951 Menopausal and female climacteric states: Secondary | ICD-10-CM

## 2023-04-22 DIAGNOSIS — E8881 Metabolic syndrome: Secondary | ICD-10-CM

## 2023-04-22 DIAGNOSIS — E669 Obesity, unspecified: Secondary | ICD-10-CM

## 2023-04-22 DIAGNOSIS — E785 Hyperlipidemia, unspecified: Secondary | ICD-10-CM

## 2023-04-22 DIAGNOSIS — E559 Vitamin D deficiency, unspecified: Secondary | ICD-10-CM

## 2023-04-22 DIAGNOSIS — G894 Chronic pain syndrome: Secondary | ICD-10-CM

## 2023-04-22 DIAGNOSIS — I5030 Unspecified diastolic (congestive) heart failure: Secondary | ICD-10-CM

## 2023-04-22 DIAGNOSIS — I10 Essential (primary) hypertension: Secondary | ICD-10-CM

## 2023-04-22 DIAGNOSIS — K582 Mixed irritable bowel syndrome: Secondary | ICD-10-CM

## 2023-04-22 DIAGNOSIS — R7303 Prediabetes: Secondary | ICD-10-CM

## 2023-04-22 DIAGNOSIS — G459 Transient cerebral ischemic attack, unspecified: Secondary | ICD-10-CM

## 2023-04-22 DIAGNOSIS — I700 Atherosclerosis of aorta: Secondary | ICD-10-CM

## 2023-04-22 DIAGNOSIS — I272 Pulmonary hypertension, unspecified: Secondary | ICD-10-CM

## 2023-04-22 DIAGNOSIS — F1120 Opioid dependence, uncomplicated: Secondary | ICD-10-CM

## 2023-04-22 DIAGNOSIS — K219 Gastro-esophageal reflux disease without esophagitis: Secondary | ICD-10-CM

## 2023-04-22 DIAGNOSIS — Z87891 Personal history of nicotine dependence: Secondary | ICD-10-CM

## 2023-04-22 DIAGNOSIS — R001 Bradycardia, unspecified: Secondary | ICD-10-CM

## 2023-04-22 DIAGNOSIS — I4581 Long QT syndrome: Secondary | ICD-10-CM

## 2023-04-22 DIAGNOSIS — Z122 Encounter for screening for malignant neoplasm of respiratory organs: Secondary | ICD-10-CM

## 2023-05-03 ENCOUNTER — Other Ambulatory Visit: Payer: Self-pay

## 2023-05-03 DIAGNOSIS — Z87891 Personal history of nicotine dependence: Secondary | ICD-10-CM

## 2023-05-03 DIAGNOSIS — Z122 Encounter for screening for malignant neoplasm of respiratory organs: Secondary | ICD-10-CM

## 2023-05-03 NOTE — Progress Notes (Signed)
Order placed

## 2023-05-10 ENCOUNTER — Encounter: Payer: Self-pay | Admitting: Cardiology

## 2023-05-10 ENCOUNTER — Ambulatory Visit: Payer: Medicare Other | Attending: Cardiology | Admitting: Cardiology

## 2023-05-10 VITALS — BP 120/78 | HR 77 | Ht 68.0 in | Wt 197.6 lb

## 2023-05-10 DIAGNOSIS — I10 Essential (primary) hypertension: Secondary | ICD-10-CM

## 2023-05-10 DIAGNOSIS — E785 Hyperlipidemia, unspecified: Secondary | ICD-10-CM | POA: Diagnosis not present

## 2023-05-10 DIAGNOSIS — R072 Precordial pain: Secondary | ICD-10-CM | POA: Diagnosis not present

## 2023-05-10 DIAGNOSIS — I5032 Chronic diastolic (congestive) heart failure: Secondary | ICD-10-CM

## 2023-05-10 MED ORDER — METOPROLOL TARTRATE 100 MG PO TABS: 100.0000 mg | ORAL_TABLET | Freq: Once | ORAL | 0 refills | Status: DC

## 2023-05-10 MED ORDER — FUROSEMIDE 20 MG PO TABS: 20.0000 mg | ORAL_TABLET | Freq: Every day | ORAL | 3 refills | Status: DC | PRN

## 2023-05-10 NOTE — Patient Instructions (Signed)
Medication Instructions:   STOP Atorvastatin  *If you need a refill on your cardiac medications before your next appointment, please call your pharmacy*   Lab Work:  Your physician recommends you go to the medical mall for labs. BMP   If you have labs (blood work) drawn today and your tests are completely normal, you will receive your results only by: MyChart Message (if you have MyChart) OR A paper copy in the mail If you have any lab test that is abnormal or we need to change your treatment, we will call you to review the results.   Testing/Procedures:    Your cardiac CT will be scheduled at one of the below locations:   [facility] [address] [place], [place] [address] [phone]  OR   Heart and Vascular center at [facility] [address] [place], [place] [address] [phone]  If scheduled at [facility] or [facility], please arrive 15 mins early for check-in and test prep.   Please follow these instructions carefully (unless otherwise directed):  An IV will be required for this test and Nitroglycerin will be given.  Hold all erectile dysfunction medications at least 3 days (72 hrs) prior to test. (Ie viagra, cialis, sildenafil, tadalafil, etc)   On the Night Before the Test: Be sure to Drink plenty of water. Do not consume any caffeinated/decaffeinated beverages or chocolate 12 hours prior to your test. Do not take any antihistamines 12 hours prior to your test.   On the Day of the Test: Drink plenty of water until 1 hour prior to the test. Do not eat any food 1 hour prior to test. HOLD losartan-hydrochlorothiazide and Furosemide You may take your regular medications prior to the test.  Take metoprolol (Lopressor) two hours prior to test. FEMALES- please wear underwire-free bra if available, avoid dresses & tight clothing  After the Test: Drink plenty of  water. After receiving IV contrast, you may experience a mild flushed feeling. This is normal. On occasion, you may experience a mild rash up to 24 hours after the test. This is not dangerous. If this occurs, you can take Benadryl 25 mg and increase your fluid intake. If you experience trouble breathing, this can be serious. If it is severe call 911 IMMEDIATELY. If it is mild, please call our office. please do not take Metformin 48 hours after completing test unless otherwise instructed.  We will call to schedule your test 2-4 weeks out understanding that some insurance companies will need an authorization prior to the service being performed.   For more information and frequently asked questions, please visit our website : [email or web address]  For non-scheduling related questions, please contact the cardiac imaging nurse navigator should you have any questions/concerns: [name], Cardiac Imaging Nurse Navigator [name], Cardiac Imaging Nurse Navigator [facility] Direct Office Dial: [phone]   For scheduling needs, including cancellations and rescheduling, please call [place], [phone].    Follow-Up: At [facility], you and your health needs are our priority.  As part of our continuing mission to provide you with exceptional heart care, we have created designated Provider Care Teams.  These Care Teams include your primary Cardiologist (physician) and Advanced Practice Providers (APPs -  Physician Assistants and Nurse Practitioners) who all work together to provide you with the care you need, when you need it.  We recommend signing up for the patient portal called "MyChart".  Sign  up information is provided on this After Visit Summary.  MyChart is used to connect with patients for Virtual Visits (Telemedicine).  Patients are able to view lab/test results, encounter notes, upcoming appointments, etc.  Non-urgent messages can  be sent to your provider as well.   To learn more about what you can do with MyChart, go to [email or web address].    Your next appointment:   6 - 8 week(s)  Provider:   You may see Debbe Odea, MD or one of the following Advanced Practice Providers on your designated Care Team:   Nicolasa Ducking, NP Eula Listen, PA-C Cadence Fransico Michael, PA-C Charlsie Quest, NP

## 2023-05-10 NOTE — Progress Notes (Signed)
Cardiology Office Note:    Date:  05/10/2023   ID:  [name], DOB [birth year], MRN [id number]  PCP:  Alba Cory, MD   [facility] Providers Cardiologist:  Debbe Odea, MD     Referring MD: Alba Cory, MD   Chief Complaint  Patient presents with   Follow-up    Patient reports left side chest pian that is "just sitting there" and radiates under rib cage.  The pain is constant for past 2 days.  Also concerned with headache and SOBr.      History of Present Illness:    [name] is a 62 y.o. female with a hx of HFpEF, hypertension, hyperlipidemia, diabetes, former smoker x15+ years, COPD who presents for follow-up.  States having left-sided chest pain beginning 2 days ago.  Symptoms are not associated with exertion, chest palpation makes symptoms worse.  Takes Lasix as needed to help with edema.  Does not check blood pressure frequently at home.  Also endorses muscle aches and cramping all over .  Has been on Lipitor for over 1 year now.  Prior notes Echocardiogram 02/19/2022 EF 60 to 65%, grade 2 diastolic dysfunction.  Past Medical History:  Diagnosis Date   Abdominal aortic atherosclerosis (HCC)    Anemia    Arthritis    bilateral knees   Bronchitis    Carpal tunnel syndrome, bilateral    Contact dermatitis and other eczema, due to unspecified cause    COPD (chronic obstructive pulmonary disease) (HCC)    DDD (degenerative disc disease)    Depressive disorder    Diverticulosis    Dysmetabolic syndrome X    Eczema    GERD (gastroesophageal reflux disease)    Hyperlipidemia    Hypertension    IBS (irritable bowel syndrome)    Insomnia    Lumbago    Nonspecific abnormal electrocardiogram (ECG) (EKG)    Osteoarthritis of both knees    Other ovarian failure(256.39)    Peptic ulcer    Postmenopausal atrophic vaginitis    Symptomatic menopausal or female climacteric states    Unspecified vitamin D deficiency     Past Surgical History:   Procedure Laterality Date   ACHILLES TENDON SURGERY Left 01/02/2022   Procedure: ACHILLES TENDON REPAIR-SECONDARY;  Surgeon: Gwyneth Revels, DPM;  Location: [facility];  Service: Podiatry;  Laterality: Left;   BACK SURGERY     BREAST EXCISIONAL BIOPSY Left ?   neg   BREAST LUMPECTOMY Left 01/08/2021   Procedure: BREAST LUMPECTOMY;  Surgeon: Campbell Lerner, MD;  Location: [facility];  Service: General;  Laterality: Left;   CARPAL TUNNEL RELEASE Left 08/15/2018   Procedure: CARPAL TUNNEL RELEASE;  Surgeon: Deeann Saint, MD;  Location: [facility];  Service: Orthopedics;  Laterality: Left;   CERVICAL DISCECTOMY     x 2; metal plate   CHOLECYSTECTOMY     COLONOSCOPY WITH PROPOFOL N/A 08/08/2018   Procedure: COLONOSCOPY WITH PROPOFOL;  Surgeon: Pasty Spillers, MD;  Location: [facility];  Service: Endoscopy;  Laterality: N/A;   COLONOSCOPY WITH PROPOFOL N/A 12/01/2021   Procedure: COLONOSCOPY WITH PROPOFOL;  Surgeon: Toney Reil, MD;  Location: [facility] ENDOSCOPY;  Service: Gastroenterology;  Laterality: N/A;   DILATATION & CURETTAGE/HYSTEROSCOPY WITH MYOSURE N/A 03/21/2015   Procedure: DILATATION & CURETTAGE/HYSTEROSCOPY;  Surgeon: [place] Bing, MD;  Location: [facility];  Service: Gynecology;  Laterality: N/A;   DILATION AND CURETTAGE OF UTERUS     ENDOMETRIAL ABLATION     ESOPHAGOGASTRODUODENOSCOPY (EGD) WITH PROPOFOL N/A 04/13/2018  Procedure: ESOPHAGOGASTRODUODENOSCOPY (EGD) WITH PROPOFOL;  Surgeon: Pasty Spillers, MD;  Location: [facility];  Service: Endoscopy;  Laterality: N/A;   ESOPHAGOGASTRODUODENOSCOPY (EGD) WITH PROPOFOL N/A 08/08/2018   Procedure: ESOPHAGOGASTRODUODENOSCOPY (EGD) WITH PROPOFOL;  Surgeon: Pasty Spillers, MD;  Location: [facility];  Service: Endoscopy;  Laterality: N/A;   ESOPHAGOGASTRODUODENOSCOPY (EGD) WITH PROPOFOL N/A 03/25/2022   Procedure: ESOPHAGOGASTRODUODENOSCOPY (EGD) WITH PROPOFOL;  Surgeon: Toney Reil, MD;  Location: [facility]  ENDOSCOPY;  Service: Gastroenterology;  Laterality: N/A;   ESOPHAGOGASTRODUODENOSCOPY (EGD) WITH PROPOFOL N/A 03/27/2022   Procedure: ESOPHAGOGASTRODUODENOSCOPY (EGD) WITH PROPOFOL;  Surgeon: Toney Reil, MD;  Location: [facility] ENDOSCOPY;  Service: Gastroenterology;  Laterality: N/A;   JOINT REPLACEMENT     OSTECTOMY Left 01/02/2022   Procedure: CALCANEAL EXOSTECTOMY;  Surgeon: Gwyneth Revels, DPM;  Location: [facility];  Service: Podiatry;  Laterality: Left;   SPINAL FUSION     lumbar x2   TONSILLECTOMY     TOTAL KNEE ARTHROPLASTY Left 04/12/2019   Procedure: TOTAL KNEE ARTHROPLASTY;  Surgeon: Lyndle Herrlich, MD;  Location: [facility];  Service: Orthopedics;  Laterality: Left;   TOTAL KNEE ARTHROPLASTY Right 04/17/2020   Procedure: TOTAL KNEE ARTHROPLASTY;  Surgeon: Lyndle Herrlich, MD;  Location: [facility];  Service: Orthopedics;  Laterality: Right;   TUBAL LIGATION      Current Medications: Current Meds  Medication Sig   amitriptyline (ELAVIL) 10 MG tablet Take 1 tablet (10 mg total) by mouth at bedtime.   amLODipine (NORVASC) 5 MG tablet Take 1 tablet (5 mg total) by mouth daily.   aspirin EC 81 MG EC tablet Take 1 tablet (81 mg total) by mouth daily. Swallow whole.   carvedilol (COREG) 6.25 MG tablet Take 1 tablet (6.25 mg total) by mouth 2 (two) times daily.   cholecalciferol (VITAMIN D) 1000 units tablet Take 1,000 Units by mouth daily.   Cyanocobalamin (B-12) 1000 MCG SUBL Place 1 each under the tongue 3 (three) times a week.   furosemide (LASIX) 20 MG tablet Take 1 tablet (20 mg total) by mouth daily as needed.   gabapentin (NEURONTIN) 300 MG capsule Take 300 mg by mouth every 6 (six) hours as needed (Nerve pain).   losartan-hydrochlorothiazide (HYZAAR) 100-25 MG tablet Take 1 tablet by mouth daily.   metFORMIN (GLUCOPHAGE) 500 MG tablet Take 1 tablet (500 mg total) by mouth daily with breakfast.   metoprolol tartrate (LOPRESSOR) 100 MG tablet Take 1 tablet (100 mg total) by mouth  once for 1 dose. TWO HOURS PRIOR TO CARDIAC CTA   Multiple Vitamins-Minerals (MULTIVITAMIN WITH MINERALS) tablet Take 1 tablet by mouth every other day.   NARCAN 4 MG/0.1ML LIQD nasal spray kit Place 1 spray into the nose once.   Oxycodone HCl 20 MG TABS Take 20 mg by mouth every 4 (four) hours as needed (pain).   QUEtiapine (SEROQUEL) 25 MG tablet Take 1 tablet (25 mg total) by mouth at bedtime.   spironolactone (ALDACTONE) 25 MG tablet TAKE 1/2 TABLET(12.5 MG) BY MOUTH DAILY   tiZANidine (ZANAFLEX) 4 MG tablet Take 4 mg by mouth every 8 (eight) hours.   valACYclovir (VALTREX) 1000 MG tablet TAKE 1 TABLET(1000 MG) BY MOUTH TWICE DAILY AS NEEDED   [DISCONTINUED] atorvastatin (LIPITOR) 40 MG tablet Take 1 tablet (40 mg total) by mouth at bedtime.     Allergies:   Patient has no known allergies.   Social History   Socioeconomic History   Marital status: Married    Spouse name: Remi Deter  Number of children: 4   Years of education: Not on file   Highest education level: 12th grade  Occupational History   Occupation: disabled    Comment: chronic back pain   Tobacco Use   Smoking status: Former    Packs/day: 1.00    Years: 20.00    Additional pack years: 0.00    Total pack years: 20.00    Types: Cigarettes    Start date: 07/26/2002    Quit date: 04/06/2019    Years since quitting: 4.0   Smokeless tobacco: Former    Quit date: 04/05/2022   Tobacco comments:    Quit Date 04/05/2022  Vaping Use   Vaping Use: Never used  Substance and Sexual Activity   Alcohol use: Yes    Comment: occasional beer   Drug use: No   Sexual activity: Yes    Partners: Male    Birth control/protection: Other-see comments    Comment: Ablation  Other Topics Concern   Not on file  Social History Narrative   Not on file   Social Determinants of Health   Financial Resource Strain: Low Risk  (11/06/2021)   Overall Financial Resource Strain (CARDIA)    Difficulty of Paying Living Expenses: Not hard at all   Food Insecurity: No Food Insecurity (04/01/2022)   Hunger Vital Sign    Worried About Running Out of Food in the Last Year: Never true    Ran Out of Food in the Last Year: Never true  Transportation Needs: No Transportation Needs (04/01/2022)   PRAPARE - Administrator, Civil Service (Medical): No    Lack of Transportation (Non-Medical): No  Physical Activity: Inactive (11/06/2021)   Exercise Vital Sign    Days of Exercise per Week: 0 days    Minutes of Exercise per Session: 0 min  Stress: No Stress Concern Present (11/06/2021)   [employer] of Occupational Health - Occupational Stress Questionnaire    Feeling of Stress : Not at all  Social Connections: Moderately Isolated (11/06/2021)   Social Connection and Isolation Panel [NHANES]    Frequency of Communication with Friends and Family: More than three times a week    Frequency of Social Gatherings with Friends and Family: Three times a week    Attends Religious Services: Never    Active Member of Clubs or Organizations: No    Attends Banker Meetings: Never    Marital Status: Married     Family History: The patient's family history includes Asthma in her daughter; Breast cancer in her maternal grandmother; Congestive Heart Failure in her mother; Depression in her daughter; Diabetes in her maternal uncle; Heart disease in her mother; Heart failure in her maternal grandfather and maternal uncle; Hypertension in her father; Kidney disease in her maternal uncle; Thyroid cancer in her mother.  ROS:   Please see the history of present illness.     All other systems reviewed and are negative.  EKGs/Labs/Other Studies Reviewed:    The following studies were reviewed today:   EKG Interpretation Date/Time:  Monday May 10 2023 11:18:04 EDT Ventricular Rate:  77 PR Interval:  128 QRS Duration:  82 QT Interval:  384 QTC Calculation: 434 R Axis:   53  Text Interpretation: Normal sinus rhythm with sinus  arrhythmia Normal ECG Confirmed by Debbe Odea ([id number]) on 05/10/2023 11:34:02 AM    Recent Labs: 09/23/2022: BUN 15; Creatinine, Ser 0.86; Potassium 3.8; Sodium 136  Recent Lipid Panel    Component Value  Date/Time   CHOL 120 02/19/2022 0339   TRIG 258 (H) 02/19/2022 0339   HDL 34 (L) 02/19/2022 0339   CHOLHDL 3.5 02/19/2022 0339   VLDL 52 (H) 02/19/2022 0339   LDLCALC 34 02/19/2022 0339   LDLCALC 34 08/07/2020 1247     Risk Assessment/Calculations:          Physical Exam:    VS:  BP 120/78 (BP Location: Left Arm, Patient Position: Sitting, Cuff Size: Large)   Pulse 77   Ht 5\' 8"  (1.727 m)   Wt 197 lb 9.6 oz (89.6 kg)   SpO2 98%   BMI 30.04 kg/m     Wt Readings from Last 3 Encounters:  05/10/23 197 lb 9.6 oz (89.6 kg)  12/04/22 207 lb 12.8 oz (94.3 kg)  09/22/22 208 lb (94.3 kg)     GEN:  Well nourished, well developed in no acute distress HEENT: Normal NECK: No JVD; No carotid bruits CARDIAC: RRR, no murmurs, rubs, gallops RESPIRATORY:  Clear to auscultation without rales, wheezing or rhonchi  ABDOMEN: Soft, non-tender, non-distended MUSCULOSKELETAL:  trace edema; No deformity.  Chest tenderness with palpation. SKIN: Warm and dry NEUROLOGIC:  Alert and oriented x 3 PSYCHIATRIC:  Normal affect   ASSESSMENT:    1. Precordial pain   2. Chronic diastolic heart failure (HCC)   3. Primary hypertension   4. Hyperlipidemia, unspecified hyperlipidemia type    PLAN:    In order of problems listed above:  Chest pain, echo 4/23 EF 60 to 65%.  Tenderness with left chest palpation noted.  Etiology likely musculoskeletal.  Patient has some risk factors.  Obtain coronary CT. HFpEF, grade 2 diastolic dysfunction, orthopnea, edema.   Hypertension, BP controlled.  Continue Hyzaar 100-25 mg daily, Norvasc 5 mg daily, Coreg 6.25 mg twice daily. Hyperlipidemia, muscle aches.  Stop Lipitor.  If symptoms improve at follow-up visit, plan to start Zetia.  Follow-up in 2  months.    Medication Adjustments/Labs and Tests Ordered: Current medicines are reviewed at length with the patient today.  Concerns regarding medicines are outlined above.  Orders Placed This Encounter  Procedures   CT CORONARY MORPH W/CTA COR W/SCORE W/CA W/CM &/OR WO/CM   Basic Metabolic Panel (BMET)   EKG 12-Lead   Meds ordered this encounter  Medications   furosemide (LASIX) 20 MG tablet    Sig: Take 1 tablet (20 mg total) by mouth daily as needed.    Dispense:  90 tablet    Refill:  3   metoprolol tartrate (LOPRESSOR) 100 MG tablet    Sig: Take 1 tablet (100 mg total) by mouth once for 1 dose. TWO HOURS PRIOR TO CARDIAC CTA    Dispense:  1 tablet    Refill:  0    Patient Instructions  Medication Instructions:   STOP Atorvastatin  *If you need a refill on your cardiac medications before your next appointment, please call your pharmacy*   Lab Work:  Your physician recommends you go to the medical mall for labs. BMP   If you have labs (blood work) drawn today and your tests are completely normal, you will receive your results only by: MyChart Message (if you have MyChart) OR A paper copy in the mail If you have any lab test that is abnormal or we need to change your treatment, we will call you to review the results.   Testing/Procedures:    Your cardiac CT will be scheduled at one of the below locations:   [facility] [address] [place], [place] [address] [phone]  OR   Heart and Vascular center at [facility] [address] [place], [place] [address] [phone]  If scheduled at [facility] or [facility], please arrive 15 mins early for check-in and test prep.   Please follow these instructions carefully (unless otherwise directed):  An IV will be required for this test and Nitroglycerin will be given.  Hold all erectile dysfunction medications at  least 3 days (72 hrs) prior to test. (Ie viagra, cialis, sildenafil, tadalafil, etc)   On the Night Before the Test: Be sure to Drink plenty of water. Do not consume any caffeinated/decaffeinated beverages or chocolate 12 hours prior to your test. Do not take any antihistamines 12 hours prior to your test.   On the Day of the Test: Drink plenty of water until 1 hour prior to the test. Do not eat any food 1 hour prior to test. HOLD losartan-hydrochlorothiazide and Furosemide You may take your regular medications prior to the test.  Take metoprolol (Lopressor) two hours prior to test. FEMALES- please wear underwire-free bra if available, avoid dresses & tight clothing  After the Test: Drink plenty of water. After receiving IV contrast, you may experience a mild flushed feeling. This is normal. On occasion, you may experience a mild rash up to 24 hours after the test. This is not dangerous. If this occurs, you can take Benadryl 25 mg and increase your fluid intake. If you experience trouble breathing, this can be serious. If it is severe call 911 IMMEDIATELY. If it is mild, please call our office. please do not take Metformin 48 hours after completing test unless otherwise instructed.  We will call to schedule your test 2-4 weeks out understanding that some insurance companies will need an authorization prior to the service being performed.   For more information and frequently asked questions, please visit our website : [email or web address]  For non-scheduling related questions, please contact the cardiac imaging nurse navigator should you have any questions/concerns: Rockwell Alexandria, Cardiac Imaging Nurse Navigator Larey Brick, Cardiac Imaging Nurse Navigator [facility] Direct Office Dial: [phone]   For scheduling needs, including cancellations and rescheduling, please call [place], [phone].    Follow-Up: At [facility], you and your health needs are our priority.  As part of our continuing mission to provide you with exceptional heart care, we have created designated Provider Care Teams.  These Care Teams include your primary Cardiologist (physician) and Advanced Practice Providers (APPs -  Physician Assistants and Nurse Practitioners) who all work together to provide you with the care you need, when you need it.  We recommend signing up for the patient portal called "MyChart".  Sign up information is provided on this After Visit Summary.  MyChart is used to connect with patients for Virtual Visits (Telemedicine).  Patients are able to view lab/test results, encounter notes, upcoming appointments, etc.  Non-urgent messages can be sent to your provider as well.   To learn more about what you can do with MyChart, go to [email or web address].    Your next appointment:   6 - 8 week(s)  Provider:   You may see Debbe Odea, MD or one of the following Advanced Practice Providers on your designated Care Team:   Nicolasa Ducking, NP Eula Listen, PA-C Cadence Fransico Michael, PA-C Charlsie Quest, NP    Signed, Debbe Odea, MD  05/10/2023 12:09 PM  [facility]

## 2023-05-12 ENCOUNTER — Ambulatory Visit (INDEPENDENT_AMBULATORY_CARE_PROVIDER_SITE_OTHER): Payer: Medicare Other | Admitting: Family Medicine

## 2023-05-12 ENCOUNTER — Encounter: Payer: Self-pay | Admitting: Family Medicine

## 2023-05-12 VITALS — BP 104/66 | HR 94 | Temp 98.6°F | Resp 16 | Ht 68.0 in | Wt 196.7 lb

## 2023-05-12 DIAGNOSIS — R252 Cramp and spasm: Secondary | ICD-10-CM

## 2023-05-12 DIAGNOSIS — I5030 Unspecified diastolic (congestive) heart failure: Secondary | ICD-10-CM | POA: Diagnosis not present

## 2023-05-12 DIAGNOSIS — I272 Pulmonary hypertension, unspecified: Secondary | ICD-10-CM

## 2023-05-12 DIAGNOSIS — F5104 Psychophysiologic insomnia: Secondary | ICD-10-CM | POA: Diagnosis not present

## 2023-05-12 DIAGNOSIS — F339 Major depressive disorder, recurrent, unspecified: Secondary | ICD-10-CM

## 2023-05-12 DIAGNOSIS — K582 Mixed irritable bowel syndrome: Secondary | ICD-10-CM

## 2023-05-12 DIAGNOSIS — K253 Acute gastric ulcer without hemorrhage or perforation: Secondary | ICD-10-CM

## 2023-05-12 DIAGNOSIS — R202 Paresthesia of skin: Secondary | ICD-10-CM

## 2023-05-12 DIAGNOSIS — I10 Essential (primary) hypertension: Secondary | ICD-10-CM

## 2023-05-12 DIAGNOSIS — K219 Gastro-esophageal reflux disease without esophagitis: Secondary | ICD-10-CM

## 2023-05-12 DIAGNOSIS — E538 Deficiency of other specified B group vitamins: Secondary | ICD-10-CM

## 2023-05-12 DIAGNOSIS — R634 Abnormal weight loss: Secondary | ICD-10-CM

## 2023-05-12 DIAGNOSIS — B001 Herpesviral vesicular dermatitis: Secondary | ICD-10-CM

## 2023-05-12 DIAGNOSIS — R630 Anorexia: Secondary | ICD-10-CM | POA: Diagnosis not present

## 2023-05-12 DIAGNOSIS — E559 Vitamin D deficiency, unspecified: Secondary | ICD-10-CM

## 2023-05-12 MED ORDER — VALACYCLOVIR HCL 1 G PO TABS
ORAL_TABLET | ORAL | 1 refills | Status: DC
Start: 2023-05-12 — End: 2023-07-19

## 2023-05-12 MED ORDER — DESVENLAFAXINE SUCCINATE ER 50 MG PO TB24
50.0000 mg | ORAL_TABLET | Freq: Every day | ORAL | 1 refills | Status: DC
Start: 2023-05-12 — End: 2023-07-19

## 2023-05-12 MED ORDER — PANTOPRAZOLE SODIUM 40 MG PO TBEC: 40.0000 mg | DELAYED_RELEASE_TABLET | Freq: Every day | ORAL | 3 refills | Status: DC

## 2023-05-12 MED ORDER — QUETIAPINE FUMARATE 25 MG PO TABS
25.0000 mg | ORAL_TABLET | Freq: Every day | ORAL | 1 refills | Status: DC
Start: 2023-05-12 — End: 2023-07-19

## 2023-05-12 NOTE — Patient Instructions (Addendum)
Half dose of losartan-hydrochlorothiazide for the next week or so to prevent blood pressure from going too low, keep taking all your other BP and heart meds  I will call you with lab results and plan if anything needs supplementation   2 week follow up on cramps, labs, BP/HTN recheck

## 2023-05-12 NOTE — Progress Notes (Signed)
Patient ID: [name], female    DOB: [birth year], 62 y.o.   MRN: [id number]  PCP: Alba Cory, MD  Chief Complaint  Patient presents with   Spasms    Cramping all over legs and back, no appetite onset for over a week   Gastroesophageal Reflux   Anorexia   Medication Refill    Missed multiple appts, meds ran out, changed pharmacies, multiple med refills req    Subjective:   [name] is a 62 y.o. female, presents to clinic with CC of the following:  HPI   Here today with multiple sx - loss of appetite, abd pain (epigastric) pain with eating, reflux - taken off PPI with hx of ulcers, she is having muscle spasms and cramps (a few days ago) and she wants electrolytes checked, she has also been out of multiple medications - She has lost some weight due to lack of appetitie and not eating as much, stomach upset, BM sometimes loose sometimes normal. No vomiting   Heart and HTN meds mostly done by cardiology on coreg, losartan-hydrochlorothiazide, spironolactone, lasix BP Readings from Last 6 Encounters:  05/12/23 104/66  05/10/23 120/78  12/04/22 (!) 150/94  09/22/22 122/74  08/11/22 (!) 140/86  05/07/22 130/80   Pulse Readings from Last 3 Encounters:  05/12/23 94  05/10/23 77  12/04/22 (!) 106    Wt Readings from Last 5 Encounters:  05/12/23 196 lb 11.2 oz (89.2 kg)  05/10/23 197 lb 9.6 oz (89.6 kg)  12/04/22 207 lb 12.8 oz (94.3 kg)  09/22/22 208 lb (94.3 kg)  08/11/22 211 lb (95.7 kg)   BMI Readings from Last 5 Encounters:  05/12/23 29.91 kg/m  05/10/23 30.04 kg/m  12/04/22 31.60 kg/m  09/22/22 31.63 kg/m  08/11/22 32.08 kg/m    About a week and a half no appetite  Muscle cramps severe the other night      Patient Active Problem List   Diagnosis Date Noted   Chronic GERD    Gastric erosion    Pulmonary hypertension (HCC) 03/19/2022   Diastolic heart failure (HCC) 03/19/2022   Other abnormalities of gait and mobility 03/02/2022    Obesity (BMI 30-39.9) 02/19/2022   Prediabetes 02/19/2022   Sinus bradycardia 02/19/2022   TIA (transient ischemic attack) 02/18/2022   Chronic pain 02/18/2022   Prolonged QT syndrome 02/05/2022   Status post left breast lumpectomy 01/23/2021   History of total knee arthroplasty, right 04/17/2020   S/P TKR (total knee replacement) using cement, left 04/12/2019   Benign neoplasm of ascending colon    Diverticulosis of large intestine without diverticulitis    Pancreatic lesion 04/21/2018   Abdominal aortic atherosclerosis (HCC) 04/21/2018   Acute peptic ulcer of stomach    Trigger thumb of right hand 02/25/2018   Carpal tunnel syndrome on both sides 10/21/2017   Elevated C-reactive protein 07/28/2017   Osteoarthritis of both knees 02/19/2016   COPD, mild (HCC) 05/08/2015   Vitamin D deficiency 05/07/2015   Metabolic syndrome 05/07/2015   Eczema 05/07/2015   Chronic insomnia 05/07/2015   Hypertension, benign 05/07/2015   Dyslipidemia 05/07/2015   Hyperglycemia 05/07/2015   Chronic low back pain 05/07/2015   Irritable bowel syndrome with both constipation and diarrhea 05/07/2015   Gastroesophageal reflux disease without esophagitis 05/07/2015   Major depression, recurrent, chronic (HCC) 05/07/2015   Menopausal syndrome (hot flashes) 05/07/2015   History of postmenopausal bleeding 01/14/2015      Current Outpatient Medications:    amitriptyline (ELAVIL)  10 MG tablet, Take 1 tablet (10 mg total) by mouth at bedtime., Disp: 90 tablet, Rfl: 1   amLODipine (NORVASC) 5 MG tablet, Take 1 tablet (5 mg total) by mouth daily., Disp: 90 tablet, Rfl: 1   aspirin EC 81 MG EC tablet, Take 1 tablet (81 mg total) by mouth daily. Swallow whole., Disp: 30 tablet, Rfl: 11   carvedilol (COREG) 6.25 MG tablet, Take 1 tablet (6.25 mg total) by mouth 2 (two) times daily., Disp: 180 tablet, Rfl: 0   cholecalciferol (VITAMIN D) 1000 units tablet, Take 1,000 Units by mouth daily., Disp: , Rfl:     Cyanocobalamin (B-12) 1000 MCG SUBL, Place 1 each under the tongue 3 (three) times a week., Disp: , Rfl:    furosemide (LASIX) 20 MG tablet, Take 1 tablet (20 mg total) by mouth daily as needed., Disp: 90 tablet, Rfl: 3   gabapentin (NEURONTIN) 300 MG capsule, Take 300 mg by mouth every 6 (six) hours as needed (Nerve pain)., Disp: , Rfl:    losartan-hydrochlorothiazide (HYZAAR) 100-25 MG tablet, Take 1 tablet by mouth daily., Disp: 90 tablet, Rfl: 1   metFORMIN (GLUCOPHAGE) 500 MG tablet, Take 1 tablet (500 mg total) by mouth daily with breakfast., Disp: 90 tablet, Rfl: 1   Multiple Vitamins-Minerals (MULTIVITAMIN WITH MINERALS) tablet, Take 1 tablet by mouth every other day., Disp: , Rfl:    NARCAN 4 MG/0.1ML LIQD nasal spray kit, Place 1 spray into the nose once., Disp: , Rfl:    Oxycodone HCl 20 MG TABS, Take 20 mg by mouth every 4 (four) hours as needed (pain)., Disp: , Rfl:    QUEtiapine (SEROQUEL) 25 MG tablet, Take 1 tablet (25 mg total) by mouth at bedtime., Disp: 90 tablet, Rfl: 1   spironolactone (ALDACTONE) 25 MG tablet, TAKE 1/2 TABLET(12.5 MG) BY MOUTH DAILY, Disp: 45 tablet, Rfl: 1   tiZANidine (ZANAFLEX) 4 MG tablet, Take 4 mg by mouth every 8 (eight) hours., Disp: , Rfl:    valACYclovir (VALTREX) 1000 MG tablet, TAKE 1 TABLET(1000 MG) BY MOUTH TWICE DAILY AS NEEDED, Disp: 10 tablet, Rfl: 0   buPROPion (WELLBUTRIN XL) 150 MG 24 hr tablet, TAKE 1 TABLET(150 MG) BY MOUTH DAILY (Patient not taking: Reported on 05/10/2023), Disp: 30 tablet, Rfl: 0   desvenlafaxine (PRISTIQ) 50 MG 24 hr tablet, Take 1 tablet (50 mg total) by mouth daily. (Patient not taking: Reported on 05/10/2023), Disp: 90 tablet, Rfl: 1   metoprolol tartrate (LOPRESSOR) 100 MG tablet, Take 1 tablet (100 mg total) by mouth once for 1 dose. TWO HOURS PRIOR TO CARDIAC CTA, Disp: 1 tablet, Rfl: 0   No Known Allergies   Social History   Tobacco Use   Smoking status: Former    Packs/day: 1.00    Years: 20.00     Additional pack years: 0.00    Total pack years: 20.00    Types: Cigarettes    Start date: 07/26/2002    Quit date: 04/06/2019    Years since quitting: 4.1   Smokeless tobacco: Former    Quit date: 04/05/2022   Tobacco comments:    Quit Date 04/05/2022  Vaping Use   Vaping Use: Never used  Substance Use Topics   Alcohol use: Yes    Comment: occasional beer   Drug use: No      Chart Review Today: I personally reviewed active problem list, medication list, allergies, family history, social history, health maintenance, notes from last encounter, lab results, imaging with the patient/caregiver  today.   Review of Systems  Constitutional:  Positive for appetite change and unexpected weight change. Negative for chills, diaphoresis, fatigue and fever.  HENT: Negative.    Eyes: Negative.   Respiratory: Negative.    Cardiovascular: Negative.  Negative for chest pain, palpitations and leg swelling.  Gastrointestinal:  Positive for nausea. Negative for abdominal pain, diarrhea and vomiting.  Endocrine: Negative.   Genitourinary: Negative.   Musculoskeletal: Negative.  Negative for myalgias.  Skin: Negative.   Allergic/Immunologic: Negative.   Neurological: Negative.   Hematological: Negative.   Psychiatric/Behavioral: Negative.    All other systems reviewed and are negative.      Objective:   Vitals:   05/12/23 1435  BP: 104/66  Pulse: 94  Resp: 16  Temp: 98.6 F (37 C)  TempSrc: Oral  SpO2: 96%  Weight: 196 lb 11.2 oz (89.2 kg)  Height: 5\' 8"  (1.727 m)    Body mass index is 29.91 kg/m.  Physical Exam Vitals and nursing note reviewed.  Constitutional:      General: She is not in acute distress.    Appearance: Normal appearance. She is well-developed. She is obese. She is not ill-appearing, toxic-appearing or diaphoretic.  HENT:     Head: Normocephalic and atraumatic.     Nose: Nose normal.  Eyes:     General:        Right eye: No discharge.        Left eye: No  discharge.     Conjunctiva/sclera: Conjunctivae normal.  Neck:     Trachea: No tracheal deviation.  Cardiovascular:     Rate and Rhythm: Normal rate and regular rhythm.     Pulses: Normal pulses.     Heart sounds: Normal heart sounds.  Pulmonary:     Effort: Pulmonary effort is normal. No respiratory distress.     Breath sounds: Normal breath sounds. No stridor.  Abdominal:     Palpations: Abdomen is soft.  Musculoskeletal:     Right lower leg: No edema.     Left lower leg: No edema.  Skin:    General: Skin is warm and dry.     Findings: No rash.  Neurological:     Mental Status: She is alert.     Motor: No abnormal muscle tone.     Coordination: Coordination normal.  Psychiatric:        Behavior: Behavior normal.      Results for orders placed or performed during the hospital encounter of 09/23/22  Basic Metabolic Panel (BMET)  Result Value Ref Range   Sodium 136 135 - 145 mmol/L   Potassium 3.8 3.5 - 5.1 mmol/L   Chloride 101 98 - 111 mmol/L   CO2 23 22 - 32 mmol/L   Glucose, Bld 100 (H) 70 - 99 mg/dL   BUN 15 6 - 20 mg/dL   Creatinine, Ser 1.61 0.44 - 1.00 mg/dL   Calcium 09.6 (H) 8.9 - 10.3 mg/dL   GFR, Estimated >04 >54 mL/min   Anion gap 12 5 - 15       Assessment & Plan:   Pt is a 62 y/o female with complicated med hx, she presents with multiple generalized acute complaints and has also is out of multiple medications due to not coming for f/up visits and appts and changing pharmacies - her PCP is dr Carlynn Purl Biggest complaints are upset stomach loss of appetite, muscle spasms  She wants refills on mulitple meds  1. B12 deficiency Recheck deficiency levels with  multiple generalized sx - CBC with Differential/Platelet - Vitamin B12  2. Hypertension, benign BP a little soft - with some weight loss - plan to have her take 1/2 tab of her losartan-hydrochlorothiazide med and recheck BP - COMPLETE METABOLIC PANEL WITH GFR  3. Pulmonary hypertension (HCC) Per  specialists  4. Diastolic heart failure, unspecified HF chronicity (HCC) Weight down, no fluid retention - continue her other cardiac meds per cardiology  5. Vitamin D deficiency Recheck - COMPLETE METABOLIC PANEL WITH GFR - VITAMIN D 25 Hydroxy (Vit-D Deficiency, Fractures)  6. Irritable bowel syndrome with both constipation and diarrhea Out of multiple meds, hx of IBS,   7. Gastroesophageal reflux disease without esophagitis She previously saw GI and was on PPI BID, instructed to decrease to once daily and then DC She reports return of sx with loss of appetite and weight loss ever since discontinuing Protonix and she does not have any refills will restart Protonix 40 mg once daily but she does need to follow-up with PCP and GI  8. Acute peptic ulcer of stomach History of -she also notes history of GERD and hiatal hernia -if her symptoms continue to return anytime she stops or weans off PPI she may need long-term meds  9. Anorexia Is likely due to withdrawal from multiple meds and GERD - COMPLETE METABOLIC PANEL WITH GFR - CBC with Differential/Platelet  10. Weight loss Some weight loss, will monitor Wt Readings from Last 5 Encounters:  05/12/23 196 lb 11.2 oz (89.2 kg)  05/10/23 197 lb 9.6 oz (89.6 kg)  12/04/22 207 lb 12.8 oz (94.3 kg)  09/22/22 208 lb (94.3 kg)  08/11/22 211 lb (95.7 kg)   BMI Readings from Last 5 Encounters:  05/12/23 29.91 kg/m  05/10/23 30.04 kg/m  12/04/22 31.60 kg/m  09/22/22 31.63 kg/m  08/11/22 32.08 kg/m   - COMPLETE METABOLIC PANEL WITH GFR - CBC with Differential/Platelet - Lipase  11. Muscle cramps She reports a few nights ago she had muscle spasms and cramps all over her legs and even on her side and abdomen Her cardiologist is having her hold her statin medication for short period of time to see if that improves her symptoms will check various labs to rule out electrolyte abnormality, deficiencies - COMPLETE METABOLIC PANEL WITH  GFR - CBC with Differential/Platelet - VITAMIN D 25 Hydroxy (Vit-D Deficiency, Fractures) - Vitamin B12 - Magnesium  12. Paresthesia Not new - COMPLETE METABOLIC PANEL WITH GFR - CBC with Differential/Platelet - Vitamin B12 - Sedimentation rate - C-reactive protein  13. Fever blister She request refill on her Valtrex - valACYclovir (VALTREX) 1000 MG tablet; TAKE 1 TABLET(1000 MG) BY MOUTH TWICE DAILY AS NEEDED  Dispense: 10 tablet; Refill: 1  14. Major depression, recurrent, chronic (HCC) Patient request refill on Pristiq and Seroquel she has been out of these she does need to do her regular follow-up with her PCP for her refills - given courtesy 2 months of meds - desvenlafaxine (PRISTIQ) 50 MG 24 hr tablet; Take 1 tablet (50 mg total) by mouth daily.  Dispense: 30 tablet; Refill: 1 - QUEtiapine (SEROQUEL) 25 MG tablet; Take 1 tablet (25 mg total) by mouth at bedtime.  Dispense: 30 tablet; Refill: 1  15. Chronic insomnia Same as #14 - QUEtiapine (SEROQUEL) 25 MG tablet; Take 1 tablet (25 mg total) by mouth at bedtime.  Dispense: 30 tablet; Refill: 1   Return for routine f/up with PCP asap in next 2 months for refills.  Danelle Berry, PA-C 05/12/23 2:59 PM

## 2023-05-13 ENCOUNTER — Emergency Department
Admission: EM | Admit: 2023-05-13 | Discharge: 2023-05-13 | Disposition: A | Discharge status: Home / Self Care | Payer: Medicare Other | Attending: Emergency Medicine | Admitting: Emergency Medicine

## 2023-05-13 ENCOUNTER — Encounter: Payer: Self-pay | Admitting: Emergency Medicine

## 2023-05-13 ENCOUNTER — Emergency Department: Payer: Medicare Other

## 2023-05-13 ENCOUNTER — Other Ambulatory Visit: Payer: Self-pay

## 2023-05-13 DIAGNOSIS — K529 Noninfective gastroenteritis and colitis, unspecified: Secondary | ICD-10-CM | POA: Diagnosis not present

## 2023-05-13 DIAGNOSIS — E860 Dehydration: Secondary | ICD-10-CM | POA: Insufficient documentation

## 2023-05-13 DIAGNOSIS — K429 Umbilical hernia without obstruction or gangrene: Secondary | ICD-10-CM | POA: Diagnosis not present

## 2023-05-13 DIAGNOSIS — R109 Unspecified abdominal pain: Secondary | ICD-10-CM | POA: Diagnosis not present

## 2023-05-13 DIAGNOSIS — R112 Nausea with vomiting, unspecified: Secondary | ICD-10-CM | POA: Diagnosis present

## 2023-05-13 DIAGNOSIS — R935 Abnormal findings on diagnostic imaging of other abdominal regions, including retroperitoneum: Secondary | ICD-10-CM | POA: Diagnosis not present

## 2023-05-13 DIAGNOSIS — K5730 Diverticulosis of large intestine without perforation or abscess without bleeding: Secondary | ICD-10-CM | POA: Diagnosis not present

## 2023-05-13 LAB — CBC WITH DIFFERENTIAL/PLATELET
Abs Immature Granulocytes: 0.07 10*3/uL (ref 0.00–0.07)
Absolute Monocytes: 1083 cells/uL — ABNORMAL HIGH (ref 200–950)
Basophils Absolute: 46 cells/uL (ref 0–200)
Basophils Absolute: 0.1 10*3/uL (ref 0.0–0.1)
Basophils Relative: 0.4 %
Basophils Relative: 0 %
Eosinophils Absolute: 114 cells/uL (ref 15–500)
Eosinophils Absolute: 0.1 10*3/uL (ref 0.0–0.5)
Eosinophils Relative: 1 %
Eosinophils Relative: 1 %
HCT: 40.8 % (ref 35.0–45.0)
HCT: 39 % (ref 36.0–46.0)
Hemoglobin: 13.6 g/dL (ref 11.7–15.5)
Hemoglobin: 12.6 g/dL (ref 12.0–15.0)
Immature Granulocytes: 1 %
Lymphocytes Relative: 21 %
Lymphs Abs: 2941 cells/uL (ref 850–3900)
Lymphs Abs: 2.9 10*3/uL (ref 0.7–4.0)
MCH: 31.2 pg (ref 27.0–33.0)
MCH: 30.8 pg (ref 26.0–34.0)
MCHC: 33.3 g/dL (ref 32.0–36.0)
MCHC: 32.3 g/dL (ref 30.0–36.0)
MCV: 93.6 fL (ref 80.0–100.0)
MCV: 95.4 fL (ref 80.0–100.0)
MPV: 10.5 fL (ref 7.5–12.5)
Monocytes Absolute: 0.8 10*3/uL (ref 0.1–1.0)
Monocytes Relative: 9.5 %
Monocytes Relative: 5 %
Neutro Abs: 7216 cells/uL (ref 1500–7800)
Neutro Abs: 9.9 10*3/uL — ABNORMAL HIGH (ref 1.7–7.7)
Neutrophils Relative %: 63.3 %
Neutrophils Relative %: 72 %
Platelets: 480 10*3/uL — ABNORMAL HIGH (ref 140–400)
Platelets: 427 10*3/uL — ABNORMAL HIGH (ref 150–400)
RBC: 4.36 10*6/uL (ref 3.80–5.10)
RBC: 4.09 MIL/uL (ref 3.87–5.11)
RDW: 12.1 % (ref 11.0–15.0)
RDW: 12.1 % (ref 11.5–15.5)
Total Lymphocyte: 25.8 %
WBC: 11.4 10*3/uL — ABNORMAL HIGH (ref 3.8–10.8)
WBC: 13.8 10*3/uL — ABNORMAL HIGH (ref 4.0–10.5)
nRBC: 0 % (ref 0.0–0.2)

## 2023-05-13 LAB — URINALYSIS, ROUTINE W REFLEX MICROSCOPIC
Bilirubin Urine: NEGATIVE
Glucose, UA: NEGATIVE mg/dL
Hgb urine dipstick: NEGATIVE
Ketones, ur: NEGATIVE mg/dL
Leukocytes,Ua: NEGATIVE
Nitrite: NEGATIVE
Protein, ur: 30 mg/dL — AB
Specific Gravity, Urine: 1.021 (ref 1.005–1.030)
pH: 5 (ref 5.0–8.0)

## 2023-05-13 LAB — COMPLETE METABOLIC PANEL WITH GFR
AG Ratio: 1.7 (calc) (ref 1.0–2.5)
ALT: 31 U/L — ABNORMAL HIGH (ref 6–29)
AST: 45 U/L — ABNORMAL HIGH (ref 10–35)
Albumin: 4.9 g/dL (ref 3.6–5.1)
Alkaline phosphatase (APISO): 116 U/L (ref 37–153)
BUN/Creatinine Ratio: 14 (calc) (ref 6–22)
BUN: 28 mg/dL — ABNORMAL HIGH (ref 7–25)
CO2: 23 mmol/L (ref 20–32)
Calcium: 11.2 mg/dL — ABNORMAL HIGH (ref 8.6–10.4)
Chloride: 99 mmol/L (ref 98–110)
Creat: 2.07 mg/dL — ABNORMAL HIGH (ref 0.50–1.05)
Globulin: 2.9 g/dL (calc) (ref 1.9–3.7)
Glucose, Bld: 83 mg/dL (ref 65–99)
Potassium: 4.2 mmol/L (ref 3.5–5.3)
Sodium: 139 mmol/L (ref 135–146)
Total Bilirubin: 0.5 mg/dL (ref 0.2–1.2)
Total Protein: 7.8 g/dL (ref 6.1–8.1)
eGFR: 27 mL/min/{1.73_m2} — ABNORMAL LOW (ref >=60)

## 2023-05-13 LAB — BASIC METABOLIC PANEL
Anion gap: 13 (ref 5–15)
BUN: 29 mg/dL — ABNORMAL HIGH (ref 6–20)
CO2: 24 mmol/L (ref 22–32)
Calcium: 9.5 mg/dL (ref 8.9–10.3)
Chloride: 98 mmol/L (ref 98–111)
Creatinine, Ser: 1.44 mg/dL — ABNORMAL HIGH (ref 0.44–1.00)
GFR, Estimated: 42 mL/min — ABNORMAL LOW (ref >60)
Glucose, Bld: 154 mg/dL — ABNORMAL HIGH (ref 70–99)
Potassium: 3.5 mmol/L (ref 3.5–5.1)
Sodium: 135 mmol/L (ref 135–145)

## 2023-05-13 LAB — LIPASE: Lipase: 73 U/L — ABNORMAL HIGH (ref 7–60)

## 2023-05-13 LAB — MAGNESIUM: Magnesium: 2.3 mg/dL (ref 1.5–2.5)

## 2023-05-13 LAB — SEDIMENTATION RATE: Sed Rate: 43 mm/h — ABNORMAL HIGH (ref 0–30)

## 2023-05-13 LAB — C-REACTIVE PROTEIN: CRP: 8.8 mg/L — ABNORMAL HIGH (ref <8.0)

## 2023-05-13 LAB — VITAMIN D 25 HYDROXY (VIT D DEFICIENCY, FRACTURES): Vit D, 25-Hydroxy: 46 ng/mL (ref 30–100)

## 2023-05-13 LAB — LIPASE, BLOOD: Lipase: 48 U/L (ref 11–51)

## 2023-05-13 LAB — VITAMIN B12: Vitamin B-12: 569 pg/mL (ref 200–1100)

## 2023-05-13 MED ORDER — IOHEXOL 300 MG/ML  SOLN
75.0000 mL | Freq: Once | INTRAMUSCULAR | Status: AC | PRN
  Administered 2023-05-13: 75 mL via INTRAVENOUS

## 2023-05-13 MED ORDER — ONDANSETRON 4 MG PO TBDP: 4.0000 mg | ORAL_TABLET | Freq: Three times a day (TID) | ORAL | 0 refills | Status: DC | PRN

## 2023-05-13 MED ORDER — SODIUM CHLORIDE 0.9 % IV SOLN: Freq: Once | INTRAVENOUS | Status: AC

## 2023-05-13 MED ORDER — MORPHINE SULFATE (PF) 4 MG/ML IV SOLN
4.0000 mg | Freq: Once | INTRAVENOUS | Status: AC
  Administered 2023-05-13: 4 mg via INTRAVENOUS
  Filled 2023-05-13: qty 1

## 2023-05-13 MED ORDER — KETOROLAC TROMETHAMINE 15 MG/ML IJ SOLN
15.0000 mg | Freq: Once | INTRAMUSCULAR | Status: AC
  Administered 2023-05-13: 15 mg via INTRAVENOUS
  Filled 2023-05-13: qty 1

## 2023-05-13 NOTE — ED Provider Notes (Signed)
[facility] Provider Note    Event Date/Time   First MD Initiated Contact with Patient 05/13/23 1910     (approximate)   History   Abnormal Labs   HPI  BIRDELLA SIPPEL is a 62 y.o. female with past medical history as detailed in chart who presents with complaints of nausea vomiting diarrhea, left flank pain over the last week.  Sent in by PCP for creatinine over 2 yesterday.  No significant improvement felt today.     Physical Exam   Triage Vital Signs: ED Triage Vitals  Encounter Vitals Group     BP 05/13/23 1807 (!) 170/96     Systolic BP Percentile --      Diastolic BP Percentile --      Pulse Rate 05/13/23 1807 71     Resp 05/13/23 1807 18     Temp 05/13/23 1807 97.8 F (36.6 C)     Temp Source 05/13/23 1807 Oral     SpO2 05/13/23 1807 99 %     Weight --      Height --      Head Circumference --      Peak Flow --      Pain Score 05/13/23 1808 8     Pain Loc --      Pain Education --      Exclude from Growth Chart --     Most recent vital signs: Vitals:   05/13/23 2101 05/13/23 2130  BP: (!) 213/84 (S) (!) 202/84  Pulse: 71 62  Resp: 10 12  Temp:  98 F (36.7 C)  SpO2: 98% 96%     General: Awake, no distress.  CV:  Good peripheral perfusion.  Resp:  Normal effort.  Abd:  No distention.  Mild left CVA tenderness, mild left left lower quadrant tenderness Other:     ED Results / Procedures / Treatments   Labs (all labs ordered are listed, but only abnormal results are displayed) Labs Reviewed  CBC WITH DIFFERENTIAL/PLATELET - Abnormal; Notable for the following components:      Result Value   WBC 13.8 (*)    Platelets 427 (*)    Neutro Abs 9.9 (*)    All other components within normal limits  BASIC METABOLIC PANEL - Abnormal; Notable for the following components:   Glucose, Bld 154 (*)    BUN 29 (*)    Creatinine, Ser 1.44 (*)    GFR, Estimated 42 (*)    All other components within normal limits  URINALYSIS,  ROUTINE W REFLEX MICROSCOPIC - Abnormal; Notable for the following components:   Color, Urine YELLOW (*)    APPearance CLOUDY (*)    Protein, ur 30 (*)    Bacteria, UA RARE (*)    All other components within normal limits  LIPASE, BLOOD     EKG     RADIOLOGY CT abdomen pelvis without acute abnormality    PROCEDURES:  Critical Care performed:   Procedures   MEDICATIONS ORDERED IN ED: Medications  0.9 %  sodium chloride infusion (0 mLs Intravenous Stopped 05/13/23 2104)  ketorolac (TORADOL) 15 MG/ML injection 15 mg (15 mg Intravenous Given 05/13/23 1950)  iohexol (OMNIPAQUE) 300 MG/ML solution 75 mL (75 mLs Intravenous Contrast Given 05/13/23 2002)  morphine (PF) 4 MG/ML injection 4 mg (4 mg Intravenous Given 05/13/23 2100)     IMPRESSION / MDM / ASSESSMENT AND PLAN / ED COURSE  I reviewed the triage vital signs and the nursing  notes. Patient's presentation is most consistent with acute presentation with potential threat to life or bodily function.  Patient presents with left-sided abdominal pain, nausea vomiting diarrhea.  Differential includes viral gastroenteritis, colitis, ureterolithiasis  Will give IV fluids, IV Toradol, obtain CT abdomen pelvis  Check urinalysis  BMP demonstrates improved creatinine, likely related to dehydration, IV fluids pending  CT scan is reassuring.  Patient feels better after fluids.  Her blood pressure is elevated but she does have a history of high blood pressure.  No indication for admission at this time, appropriate for discharge, recommend recheck kidney function in 2 to 3 days.  Patient agrees with this plan and feels ready to go home      FINAL CLINICAL IMPRESSION(S) / ED DIAGNOSES   Final diagnoses:  Dehydration  Gastroenteritis     Rx / DC Orders   ED Discharge Orders          Ordered    ondansetron (ZOFRAN-ODT) 4 MG disintegrating tablet  Every 8 hours PRN        05/13/23 2135             Note:  This  document was prepared using Dragon voice recognition software and may include unintentional dictation errors.   Jene Every, MD 05/13/23 2151

## 2023-05-13 NOTE — ED Triage Notes (Signed)
Patient to ED via POV sent form PCP- had labs done yesterday- low kidney function. Concerned for AKI. C/o left sided back pain, leg cramping, headache, and vomiting.

## 2023-05-13 NOTE — Discharge Instructions (Addendum)
Your kidney function was improved here. We gave you fluids for dehydration. I recommend kidney function recheck early next week.

## 2023-05-17 ENCOUNTER — Ambulatory Visit: Payer: Medicare Other | Admitting: Cardiology

## 2023-05-17 DIAGNOSIS — G894 Chronic pain syndrome: Secondary | ICD-10-CM | POA: Diagnosis not present

## 2023-05-17 DIAGNOSIS — Z79891 Long term (current) use of opiate analgesic: Secondary | ICD-10-CM | POA: Diagnosis not present

## 2023-05-17 DIAGNOSIS — Z79899 Other long term (current) drug therapy: Secondary | ICD-10-CM | POA: Diagnosis not present

## 2023-05-19 ENCOUNTER — Telehealth: Payer: Self-pay

## 2023-05-19 NOTE — Telephone Encounter (Signed)
Transition Care Management Unsuccessful Follow-up Telephone Call  Date of discharge and from where:  [place] 7/11  Attempts:  1st Attempt  Reason for unsuccessful TCM follow-up call:  No answer/busy   Lenard Forth [facility] Guide, [facility] [phone] [address] [place], [place], [place] [address] Phone: [phone] Email: [email or web address]

## 2023-05-19 NOTE — Telephone Encounter (Signed)
Transition Care Management Unsuccessful Follow-up Telephone Call  Date of discharge and from where:  [place] 7/11  Attempts:  2nd Attempt  Reason for unsuccessful TCM follow-up call:  No answer/busy   Lenard Forth [facility] Guide, [facility] [phone] [address] [place], [place], [place] [address] Phone: [phone] Email: [email or web address]

## 2023-06-08 ENCOUNTER — Encounter (HOSPITAL_COMMUNITY): Payer: Self-pay

## 2023-06-09 ENCOUNTER — Telehealth (HOSPITAL_COMMUNITY): Payer: Self-pay | Admitting: Emergency Medicine

## 2023-06-09 NOTE — Telephone Encounter (Signed)
Reaching out to patient to offer assistance regarding upcoming cardiac imaging study; pt verbalizes understanding of appt date/time, parking situation and where to check in, pre-test NPO status and medications ordered, and verified current allergies; name and call back number provided for further questions should they arise   RN Navigator Cardiac Imaging [facility] [phone] office [phone] cell 

## 2023-06-10 ENCOUNTER — Other Ambulatory Visit: Payer: Self-pay | Admitting: Family Medicine

## 2023-06-10 ENCOUNTER — Ambulatory Visit
Admission: RE | Admit: 2023-06-10 | Discharge: 2023-06-10 | Disposition: A | Discharge status: Home / Self Care | Payer: Medicare Other | Source: Ambulatory Visit | Attending: Cardiology | Admitting: Cardiology

## 2023-06-10 DIAGNOSIS — R072 Precordial pain: Secondary | ICD-10-CM

## 2023-06-10 DIAGNOSIS — I10 Essential (primary) hypertension: Secondary | ICD-10-CM

## 2023-06-10 MED ORDER — NITROGLYCERIN 0.4 MG SL SUBL
0.8000 mg | SUBLINGUAL_TABLET | Freq: Once | SUBLINGUAL | Status: AC
  Administered 2023-06-10: 0.8 mg via SUBLINGUAL

## 2023-06-10 MED ORDER — SODIUM CHLORIDE 0.9 % IV SOLN: INTRAVENOUS | Status: DC

## 2023-06-10 MED ORDER — IOHEXOL 350 MG/ML SOLN
75.0000 mL | Freq: Once | INTRAVENOUS | Status: AC | PRN
  Administered 2023-06-10: 75 mL via INTRAVENOUS

## 2023-06-10 NOTE — Progress Notes (Addendum)
Patient tolerated procedure well. Ambulate w/o difficulty. Denies any lightheadedness or being dizzy. Pt denies any pain at this time. Sitting in chair, drinking water provided. Pt is encouraged to drink additional water throughout the day and reason explained to patient. Patient verbalized understanding and all questions answered. ABC intact. No further needs at this time. Discharge from procedure area w/o issues.  

## 2023-06-16 ENCOUNTER — Telehealth: Payer: Self-pay | Admitting: Cardiology

## 2023-06-16 NOTE — Telephone Encounter (Signed)
Pt returning call regarding CT results. Please advise

## 2023-06-16 NOTE — Telephone Encounter (Signed)
Patient advised of providers message and verbalized understanding.     Debbe Odea, MD 06/11/2023  6:51 PM EDT     Normal coronary CTA, no CAD.

## 2023-06-17 DIAGNOSIS — M542 Cervicalgia: Secondary | ICD-10-CM | POA: Diagnosis not present

## 2023-06-17 DIAGNOSIS — M25512 Pain in left shoulder: Secondary | ICD-10-CM | POA: Diagnosis not present

## 2023-06-17 DIAGNOSIS — M5431 Sciatica, right side: Secondary | ICD-10-CM | POA: Diagnosis not present

## 2023-06-17 DIAGNOSIS — M170 Bilateral primary osteoarthritis of knee: Secondary | ICD-10-CM | POA: Diagnosis not present

## 2023-06-17 DIAGNOSIS — G894 Chronic pain syndrome: Secondary | ICD-10-CM | POA: Diagnosis not present

## 2023-06-17 DIAGNOSIS — M5432 Sciatica, left side: Secondary | ICD-10-CM | POA: Diagnosis not present

## 2023-06-17 DIAGNOSIS — M25561 Pain in right knee: Secondary | ICD-10-CM | POA: Diagnosis not present

## 2023-06-17 DIAGNOSIS — M25552 Pain in left hip: Secondary | ICD-10-CM | POA: Diagnosis not present

## 2023-06-17 DIAGNOSIS — Z79891 Long term (current) use of opiate analgesic: Secondary | ICD-10-CM | POA: Diagnosis not present

## 2023-06-17 DIAGNOSIS — M25551 Pain in right hip: Secondary | ICD-10-CM | POA: Diagnosis not present

## 2023-06-17 DIAGNOSIS — M25562 Pain in left knee: Secondary | ICD-10-CM | POA: Diagnosis not present

## 2023-06-17 DIAGNOSIS — M545 Low back pain, unspecified: Secondary | ICD-10-CM | POA: Diagnosis not present

## 2023-06-17 DIAGNOSIS — M25511 Pain in right shoulder: Secondary | ICD-10-CM | POA: Diagnosis not present

## 2023-06-21 ENCOUNTER — Telehealth: Payer: Self-pay | Admitting: Family Medicine

## 2023-06-21 NOTE — Telephone Encounter (Signed)
Pt is calling to speak to the nurse regarding some FMLA paperwork for her daughter. Please advise CB- [phone]

## 2023-06-22 ENCOUNTER — Other Ambulatory Visit: Payer: Self-pay | Admitting: Cardiology

## 2023-06-22 NOTE — Telephone Encounter (Signed)
Returned call to patient. She is going to have the FMLA forms faxed over to cover her daughter for appointments and caregiving.

## 2023-06-23 ENCOUNTER — Other Ambulatory Visit: Payer: Self-pay

## 2023-06-23 MED ORDER — CARVEDILOL 6.25 MG PO TABS: 6.2500 mg | ORAL_TABLET | Freq: Two times a day (BID) | ORAL | 0 refills | Status: DC

## 2023-07-01 ENCOUNTER — Other Ambulatory Visit: Payer: Self-pay | Admitting: Family Medicine

## 2023-07-01 DIAGNOSIS — I10 Essential (primary) hypertension: Secondary | ICD-10-CM

## 2023-07-02 NOTE — Progress Notes (Deleted)
Name: [name]   MRN: [id number]    DOB: 02-27-1961   Date:07/02/2023       Progress Note  Subjective  Chief Complaint  Follow up  HPI   Patient Active Problem List   Diagnosis Date Noted   Chronic GERD    Gastric erosion    Pulmonary hypertension (HCC) 03/19/2022   Diastolic heart failure (HCC) 03/19/2022   Other abnormalities of gait and mobility 03/02/2022   Obesity (BMI 30-39.9) 02/19/2022   Prediabetes 02/19/2022   Sinus bradycardia 02/19/2022   TIA (transient ischemic attack) 02/18/2022   Chronic pain 02/18/2022   Prolonged QT syndrome 02/05/2022   Status post left breast lumpectomy 01/23/2021   History of total knee arthroplasty, right 04/17/2020   S/P TKR (total knee replacement) using cement, left 04/12/2019   Benign neoplasm of ascending colon    Diverticulosis of large intestine without diverticulitis    Pancreatic lesion 04/21/2018   Abdominal aortic atherosclerosis (HCC) 04/21/2018   Acute peptic ulcer of stomach    Trigger thumb of right hand 02/25/2018   Carpal tunnel syndrome on both sides 10/21/2017   Elevated C-reactive protein 07/28/2017   Osteoarthritis of both knees 02/19/2016   COPD, mild (HCC) 05/08/2015   Vitamin D deficiency 05/07/2015   Metabolic syndrome 05/07/2015   Eczema 05/07/2015   Chronic insomnia 05/07/2015   Hypertension, benign 05/07/2015   Dyslipidemia 05/07/2015   Hyperglycemia 05/07/2015   Chronic low back pain 05/07/2015   Irritable bowel syndrome with both constipation and diarrhea 05/07/2015   Gastroesophageal reflux disease without esophagitis 05/07/2015   Major depression, recurrent, chronic (HCC) 05/07/2015   Menopausal syndrome (hot flashes) 05/07/2015   History of postmenopausal bleeding 01/14/2015    Past Surgical History:  Procedure Laterality Date   ACHILLES TENDON SURGERY Left 01/02/2022   Procedure: ACHILLES TENDON REPAIR-SECONDARY;  Surgeon: Gwyneth Revels, DPM;  Location: [facility];  Service: Podiatry;   Laterality: Left;   BACK SURGERY     BREAST EXCISIONAL BIOPSY Left ?   neg   BREAST LUMPECTOMY Left 01/08/2021   Procedure: BREAST LUMPECTOMY;  Surgeon: Campbell Lerner, MD;  Location: [facility];  Service: General;  Laterality: Left;   CARPAL TUNNEL RELEASE Left 08/15/2018   Procedure: CARPAL TUNNEL RELEASE;  Surgeon: Deeann Saint, MD;  Location: [facility];  Service: Orthopedics;  Laterality: Left;   CERVICAL DISCECTOMY     x 2; metal plate   CHOLECYSTECTOMY     COLONOSCOPY WITH PROPOFOL N/A 08/08/2018   Procedure: COLONOSCOPY WITH PROPOFOL;  Surgeon: Pasty Spillers, MD;  Location: [facility];  Service: Endoscopy;  Laterality: N/A;   COLONOSCOPY WITH PROPOFOL N/A 12/01/2021   Procedure: COLONOSCOPY WITH PROPOFOL;  Surgeon: Toney Reil, MD;  Location[facility];  Service: Gastroenterology;  Laterality: N/A;   DILATATION & CURETTAGE/HYSTEROSCOPY WITH MYOSURE N/A 03/21/2015   Procedure: DILATATION & CURETTAGE/HYSTEROSCOPY;  Surgeon: Marne Bing, MD;  Location: [facility];  Service: Gynecology;  Laterality: N/A;   DILATION AND CURETTAGE OF UTERUS     ENDOMETRIAL ABLATION     ESOPHAGOGASTRODUODENOSCOPY (EGD) WITH PROPOFOL N/A 04/13/2018   Procedure: ESOPHAGOGASTRODUODENOSCOPY (EGD) WITH PROPOFOL;  Surgeon: Pasty Spillers, MD;  Location: [facility];  Service: Endoscopy;  Laterality: N/A;   ESOPHAGOGASTRODUODENOSCOPY (EGD) WITH PROPOFOL N/A 08/08/2018   Procedure: ESOPHAGOGASTRODUODENOSCOPY (EGD) WITH PROPOFOL;  Surgeon: Pasty Spillers, MD;  Location: [facility];  Service: Endoscopy;  Laterality: N/A;   ESOPHAGOGASTRODUODENOSCOPY (EGD) WITH PROPOFOL N/A 03/25/2022   Procedure: ESOPHAGOGASTRODUODENOSCOPY (EGD) WITH PROPOFOL;  Surgeon:  Toney Reil, MD;  Location: [facility];  Service: Gastroenterology;  Laterality: N/A;   ESOPHAGOGASTRODUODENOSCOPY (EGD) WITH PROPOFOL N/A 03/27/2022   Procedure: ESOPHAGOGASTRODUODENOSCOPY (EGD) WITH PROPOFOL;   Surgeon: Toney Reil, MD;  Location: [facility] ENDOSCOPY;  Service: Gastroenterology;  Laterality: N/A;   JOINT REPLACEMENT     OSTECTOMY Left 01/02/2022   Procedure: CALCANEAL EXOSTECTOMY;  Surgeon: Gwyneth Revels, DPM;  Location: [facility];  Service: Podiatry;  Laterality: Left;   SPINAL FUSION     lumbar x2   TONSILLECTOMY     TOTAL KNEE ARTHROPLASTY Left 04/12/2019   Procedure: TOTAL KNEE ARTHROPLASTY;  Surgeon: Lyndle Herrlich, MD;  Location: [facility];  Service: Orthopedics;  Laterality: Left;   TOTAL KNEE ARTHROPLASTY Right 04/17/2020   Procedure: TOTAL KNEE ARTHROPLASTY;  Surgeon: Lyndle Herrlich, MD;  Location: [facility];  Service: Orthopedics;  Laterality: Right;   TUBAL LIGATION      Family History  Problem Relation Age of Onset   Heart disease Mother    Thyroid cancer Mother    Congestive Heart Failure Mother    Hypertension Father    Heart failure Maternal Uncle    Diabetes Maternal Uncle    Kidney disease Maternal Uncle    Breast cancer Maternal Grandmother    Heart failure Maternal Grandfather    Depression Daughter    Asthma Daughter     Social History   Tobacco Use   Smoking status: Former    Current packs/day: 0.00    Average packs/day: 1 pack/day for 20.0 years (20.0 ttl pk-yrs)    Types: Cigarettes    Start date: 07/26/2002    Quit date: 04/06/2019    Years since quitting: 4.2   Smokeless tobacco: Former    Quit date: 04/05/2022   Tobacco comments:    Quit Date 04/05/2022  Substance Use Topics   Alcohol use: Yes    Comment: occasional beer     Current Outpatient Medications:    amitriptyline (ELAVIL) 10 MG tablet, Take 1 tablet (10 mg total) by mouth at bedtime., Disp: 90 tablet, Rfl: 1   amLODipine (NORVASC) 5 MG tablet, Take 1 tablet (5 mg total) by mouth daily., Disp: 90 tablet, Rfl: 1   aspirin EC 81 MG EC tablet, Take 1 tablet (81 mg total) by mouth daily. Swallow whole., Disp: 30 tablet, Rfl: 11   carvedilol (COREG) 6.25 MG tablet, Take 1 tablet  (6.25 mg total) by mouth 2 (two) times daily., Disp: 180 tablet, Rfl: 0   cholecalciferol (VITAMIN D) 1000 units tablet, Take 1,000 Units by mouth daily., Disp: , Rfl:    Cyanocobalamin (B-12) 1000 MCG SUBL, Place 1 each under the tongue 3 (three) times a week., Disp: , Rfl:    desvenlafaxine (PRISTIQ) 50 MG 24 hr tablet, Take 1 tablet (50 mg total) by mouth daily., Disp: 30 tablet, Rfl: 1   furosemide (LASIX) 20 MG tablet, Take 1 tablet (20 mg total) by mouth daily as needed., Disp: 90 tablet, Rfl: 3   gabapentin (NEURONTIN) 300 MG capsule, Take 300 mg by mouth every 6 (six) hours as needed (Nerve pain)., Disp: , Rfl:    losartan-hydrochlorothiazide (HYZAAR) 100-25 MG tablet, TAKE 1 TABLET BY MOUTH DAILY, Disp: 30 tablet, Rfl: 0   metFORMIN (GLUCOPHAGE) 500 MG tablet, Take 1 tablet (500 mg total) by mouth daily with breakfast., Disp: 90 tablet, Rfl: 1   metoprolol tartrate (LOPRESSOR) 100 MG tablet, Take 1 tablet (100 mg total) by mouth once for 1 dose. TWO  HOURS PRIOR TO CARDIAC CTA, Disp: 1 tablet, Rfl: 0   Multiple Vitamins-Minerals (MULTIVITAMIN WITH MINERALS) tablet, Take 1 tablet by mouth every other day., Disp: , Rfl:    NARCAN 4 MG/0.1ML LIQD nasal spray kit, Place 1 spray into the nose once., Disp: , Rfl:    ondansetron (ZOFRAN-ODT) 4 MG disintegrating tablet, Take 1 tablet (4 mg total) by mouth every 8 (eight) hours as needed for nausea or vomiting., Disp: 20 tablet, Rfl: 0   Oxycodone HCl 20 MG TABS, Take 20 mg by mouth every 4 (four) hours as needed (pain)., Disp: , Rfl:    pantoprazole (PROTONIX) 40 MG tablet, Take 1 tablet (40 mg total) by mouth daily., Disp: 30 tablet, Rfl: 3   QUEtiapine (SEROQUEL) 25 MG tablet, Take 1 tablet (25 mg total) by mouth at bedtime., Disp: 30 tablet, Rfl: 1   spironolactone (ALDACTONE) 25 MG tablet, TAKE 1/2 TABLET(12.5 MG) BY MOUTH DAILY, Disp: 45 tablet, Rfl: 1   tiZANidine (ZANAFLEX) 4 MG tablet, Take 4 mg by mouth every 8 (eight) hours., Disp: , Rfl:     valACYclovir (VALTREX) 1000 MG tablet, TAKE 1 TABLET(1000 MG) BY MOUTH TWICE DAILY AS NEEDED, Disp: 10 tablet, Rfl: 1  No Known Allergies  I personally reviewed {Reviewed:[id number]} with the patient/caregiver today.   ROS  ***  Objective  There were no vitals filed for this visit.  There is no height or weight on file to calculate BMI.  Physical Exam ***  Recent Results (from the past 2160 hour(s))  COMPLETE METABOLIC PANEL WITH GFR     Status: Abnormal   Collection Time: 05/12/23  4:06 PM  Result Value Ref Range   Glucose, Bld 83 65 - 99 mg/dL    Comment: .            Fasting reference interval .    BUN 28 (H) 7 - 25 mg/dL   Creat 7.82 (H) 9.56 - 1.05 mg/dL   eGFR 27 (L) > OR = 60 mL/min/1.38m2   BUN/Creatinine Ratio 14 6 - 22 (calc)   Sodium 139 135 - 146 mmol/L   Potassium 4.2 3.5 - 5.3 mmol/L   Chloride 99 98 - 110 mmol/L   CO2 23 20 - 32 mmol/L   Calcium 11.2 (H) 8.6 - 10.4 mg/dL   Total Protein 7.8 6.1 - 8.1 g/dL   Albumin 4.9 3.6 - 5.1 g/dL   Globulin 2.9 1.9 - 3.7 g/dL (calc)   AG Ratio 1.7 1.0 - 2.5 (calc)   Total Bilirubin 0.5 0.2 - 1.2 mg/dL   Alkaline phosphatase (APISO) 116 37 - 153 U/L   AST 45 (H) 10 - 35 U/L   ALT 31 (H) 6 - 29 U/L  CBC with Differential/Platelet     Status: Abnormal   Collection Time: 05/12/23  4:06 PM  Result Value Ref Range   WBC 11.4 (H) 3.8 - 10.8 Thousand/uL   RBC 4.36 3.80 - 5.10 Million/uL   Hemoglobin 13.6 11.7 - 15.5 g/dL   HCT 21.3 08.6 - 57.8 %   MCV 93.6 80.0 - 100.0 fL   MCH 31.2 27.0 - 33.0 pg   MCHC 33.3 32.0 - 36.0 g/dL   RDW 46.9 62.9 - 52.8 %   Platelets 480 (H) 140 - 400 Thousand/uL   MPV 10.5 7.5 - 12.5 fL   Neutro Abs 7,216 1,500 - 7,800 cells/uL   Lymphs Abs 2,941 850 - 3,900 cells/uL   Absolute Monocytes 1,083 (H) 200 - 950 cells/uL  Eosinophils Absolute 114 15 - 500 cells/uL   Basophils Absolute 46 0 - 200 cells/uL   Neutrophils Relative % 63.3 %   Total Lymphocyte 25.8 %   Monocytes Relative  9.5 %   Eosinophils Relative 1.0 %   Basophils Relative 0.4 %  VITAMIN D 25 Hydroxy (Vit-D Deficiency, Fractures)     Status: None   Collection Time: 05/12/23  4:06 PM  Result Value Ref Range   Vit D, 25-Hydroxy 46 30 - 100 ng/mL    Comment: Vitamin D Status         25-OH Vitamin D: . Deficiency:                    <20 ng/mL Insufficiency:             20 - 29 ng/mL Optimal:                 > or = 30 ng/mL . For 25-OH Vitamin D testing on patients on  D2-supplementation and patients for whom quantitation  of D2 and D3 fractions is required, the [facility](TM) 25-OH VIT D, (D2,D3), LC/MS/MS is recommended: order  code 46962 (patients >100yrs). . See Note 1 . Note 1 . For additional information, please refer to  http://education.[facility].com/faq/FAQ199  (This link is being provided for informational/ educational purposes only.)   Vitamin B12     Status: None   Collection Time: 05/12/23  4:06 PM  Result Value Ref Range   Vitamin B-12 569 200 - 1,100 pg/mL  Magnesium     Status: None   Collection Time: 05/12/23  4:06 PM  Result Value Ref Range   Magnesium 2.3 1.5 - 2.5 mg/dL  Sedimentation rate     Status: Abnormal   Collection Time: 05/12/23  4:06 PM  Result Value Ref Range   Sed Rate 43 (H) 0 - 30 mm/h  C-reactive protein     Status: Abnormal   Collection Time: 05/12/23  4:06 PM  Result Value Ref Range   CRP 8.8 (H) <8.0 mg/L  Lipase     Status: Abnormal   Collection Time: 05/12/23  4:06 PM  Result Value Ref Range   Lipase 73 (H) 7 - 60 U/L  CBC with Differential     Status: Abnormal   Collection Time: 05/13/23  6:10 PM  Result Value Ref Range   WBC 13.8 (H) 4.0 - 10.5 K/uL   RBC 4.09 3.87 - 5.11 MIL/uL   Hemoglobin 12.6 12.0 - 15.0 g/dL   HCT 95.2 84.1 - 32.4 %   MCV 95.4 80.0 - 100.0 fL   MCH 30.8 26.0 - 34.0 pg   MCHC 32.3 30.0 - 36.0 g/dL   RDW 40.1 02.7 - 25.3 %   Platelets 427 (H) 150 - 400 K/uL   nRBC 0.0 0.0 - 0.2 %   Neutrophils Relative % 72  %   Neutro Abs 9.9 (H) 1.7 - 7.7 K/uL   Lymphocytes Relative 21 %   Lymphs Abs 2.9 0.7 - 4.0 K/uL   Monocytes Relative 5 %   Monocytes Absolute 0.8 0.1 - 1.0 K/uL   Eosinophils Relative 1 %   Eosinophils Absolute 0.1 0.0 - 0.5 K/uL   Basophils Relative 0 %   Basophils Absolute 0.1 0.0 - 0.1 K/uL   Immature Granulocytes 1 %   Abs Immature Granulocytes 0.07 0.00 - 0.07 K/uL    Comment: Performed at [facility], [address] [place], [place] [address]  Basic  metabolic panel     Status: Abnormal   Collection Time: 05/13/23  6:10 PM  Result Value Ref Range   Sodium 135 135 - 145 mmol/L   Potassium 3.5 3.5 - 5.1 mmol/L   Chloride 98 98 - 111 mmol/L   CO2 24 22 - 32 mmol/L   Glucose, Bld 154 (H) 70 - 99 mg/dL    Comment: Glucose reference range applies only to samples taken after fasting for at least 8 hours.   BUN 29 (H) 6 - 20 mg/dL   Creatinine, Ser 1.61 (H) 0.44 - 1.00 mg/dL   Calcium 9.5 8.9 - 09.6 mg/dL   GFR, Estimated 42 (L) >60 mL/min    Comment: (NOTE) Calculated using the CKD-EPI Creatinine Equation (2021)    Anion gap 13 5 - 15    Comment: Performed at [facility], [address] [place], [place] [address]  Lipase, blood     Status: None   Collection Time: 05/13/23  7:37 PM  Result Value Ref Range   Lipase 48 11 - 51 U/L    Comment: Performed at [facility], [address] [place], [place] [address]  Urinalysis, Routine w reflex microscopic -Urine, Clean Catch     Status: Abnormal   Collection Time: 05/13/23  7:37 PM  Result Value Ref Range   Color, Urine YELLOW (A) YELLOW   APPearance CLOUDY (A) CLEAR   Specific Gravity, Urine 1.021 1.005 - 1.030   pH 5.0 5.0 - 8.0   Glucose, UA NEGATIVE NEGATIVE mg/dL   Hgb urine dipstick NEGATIVE NEGATIVE   Bilirubin Urine NEGATIVE NEGATIVE   Ketones, ur NEGATIVE NEGATIVE mg/dL   Protein, ur 30 (A) NEGATIVE mg/dL   Nitrite NEGATIVE NEGATIVE   Leukocytes,Ua NEGATIVE NEGATIVE   RBC / HPF  0-5 0 - 5 RBC/hpf   WBC, UA 0-5 0 - 5 WBC/hpf   Bacteria, UA RARE (A) NONE SEEN   Squamous Epithelial / HPF 0-5 0 - 5 /HPF   Mucus PRESENT    Hyaline Casts, UA PRESENT     Comment: Performed at [facility], [address] [place], [place] [address]    Diabetic Foot Exam: Diabetic Foot Exam - Simple   No data filed    ***  PHQ2/9:    05/12/2023    2:35 PM 11/09/2022    1:12 PM 09/22/2022    8:48 AM 03/19/2022   10:06 AM 03/02/2022    2:40 PM  Depression screen PHQ 2/9  Decreased Interest 0 1 1 1  0  Down, Depressed, Hopeless 1 1 1 1  0  PHQ - 2 Score 1 2 2 2  0  Altered sleeping 1 2 3 1  0  Tired, decreased energy 1 1 0 1 0  Change in appetite 1 0 0 0 0  Feeling bad or failure about yourself  0 0 0 0 0  Trouble concentrating 0 0 0 0 0  Moving slowly or fidgety/restless 0 0 0 0 0  Suicidal thoughts 0 0 0 0 0  PHQ-9 Score 4 5 5 4  0  Difficult doing work/chores Somewhat difficult Somewhat difficult  Somewhat difficult Not difficult at all    phq 9 is {gen pos [id number]} ***  Fall Risk:    05/12/2023    2:35 PM 11/09/2022    1:12 PM 09/22/2022    8:47 AM 04/01/2022    3:42 PM 03/19/2022   10:06 AM  Fall Risk   Falls in the past year? 1 0  0 1 1  Number falls in past yr: 0 0 0 0 0  Injury with Fall? 1 0 0 1 1  Risk for fall due to : Impaired balance/gait No Fall Risks No Fall Risks  History of fall(s)  Follow up Falls prevention discussed;Education provided;Falls evaluation completed Falls prevention discussed;Education provided;Falls evaluation completed Falls prevention discussed  Education provided;Falls evaluation completed;Falls prevention discussed   ***   Functional Status Survey:   ***   Assessment & Plan  *** There are no diagnoses linked to this encounter.

## 2023-07-06 ENCOUNTER — Ambulatory Visit: Payer: Medicare Other | Admitting: Family Medicine

## 2023-07-07 NOTE — Telephone Encounter (Signed)
Copied from CRM [id number]. Topic: General - Other >> Jul 06, 2023  4:25 PM Everette C wrote: Reason for CRM: The patient has requested to be contacted by Margo Aye when possible regarding previously discussed FMLA paperwork and it's submission/completion   Please contact further when available

## 2023-07-12 ENCOUNTER — Encounter: Payer: Self-pay | Admitting: Cardiology

## 2023-07-12 ENCOUNTER — Ambulatory Visit: Payer: Medicare Other | Attending: Cardiology | Admitting: Cardiology

## 2023-07-15 ENCOUNTER — Telehealth: Payer: Self-pay

## 2023-07-15 NOTE — Telephone Encounter (Signed)
Copied from CRM [id number]. Topic: Appointment Scheduling - Scheduling Inquiry for Clinic >> Jul 14, 2023  4:37 PM Marlow Baars wrote: Reason for CRM: The patient called wanting to schedule the soonest appt possible as she needs refills. She also needs for the provider to fill out FMLA paperwork so her daughter can bring her to appts. She was told last week that someone would get back to her as soon as possible. She has not heard from anyone yet. Please asst patient further >> Jul 15, 2023  7:28 AM Bjorn Loser B wrote: Please advise where to put this appt in with provider.

## 2023-07-15 NOTE — Telephone Encounter (Signed)
She needs her FMLA filled out. Another provider most likely will not fill that out?

## 2023-07-16 DIAGNOSIS — M25561 Pain in right knee: Secondary | ICD-10-CM | POA: Diagnosis not present

## 2023-07-16 DIAGNOSIS — M542 Cervicalgia: Secondary | ICD-10-CM | POA: Diagnosis not present

## 2023-07-16 DIAGNOSIS — Z79891 Long term (current) use of opiate analgesic: Secondary | ICD-10-CM | POA: Diagnosis not present

## 2023-07-16 DIAGNOSIS — M170 Bilateral primary osteoarthritis of knee: Secondary | ICD-10-CM | POA: Diagnosis not present

## 2023-07-16 DIAGNOSIS — M545 Low back pain, unspecified: Secondary | ICD-10-CM | POA: Diagnosis not present

## 2023-07-16 DIAGNOSIS — M25511 Pain in right shoulder: Secondary | ICD-10-CM | POA: Diagnosis not present

## 2023-07-16 DIAGNOSIS — M25551 Pain in right hip: Secondary | ICD-10-CM | POA: Diagnosis not present

## 2023-07-16 DIAGNOSIS — M25552 Pain in left hip: Secondary | ICD-10-CM | POA: Diagnosis not present

## 2023-07-16 DIAGNOSIS — M5136 Other intervertebral disc degeneration, lumbar region: Secondary | ICD-10-CM | POA: Diagnosis not present

## 2023-07-16 DIAGNOSIS — M25512 Pain in left shoulder: Secondary | ICD-10-CM | POA: Diagnosis not present

## 2023-07-16 DIAGNOSIS — G894 Chronic pain syndrome: Secondary | ICD-10-CM | POA: Diagnosis not present

## 2023-07-16 DIAGNOSIS — M25562 Pain in left knee: Secondary | ICD-10-CM | POA: Diagnosis not present

## 2023-07-16 DIAGNOSIS — M5431 Sciatica, right side: Secondary | ICD-10-CM | POA: Diagnosis not present

## 2023-07-19 ENCOUNTER — Ambulatory Visit (INDEPENDENT_AMBULATORY_CARE_PROVIDER_SITE_OTHER): Payer: Medicare Other | Admitting: Nurse Practitioner

## 2023-07-19 ENCOUNTER — Encounter: Payer: Self-pay | Admitting: Nurse Practitioner

## 2023-07-19 ENCOUNTER — Other Ambulatory Visit: Payer: Self-pay

## 2023-07-19 VITALS — BP 126/82 | HR 79 | Temp 98.7°F | Resp 16 | Ht 68.0 in | Wt 204.8 lb

## 2023-07-19 DIAGNOSIS — K219 Gastro-esophageal reflux disease without esophagitis: Secondary | ICD-10-CM | POA: Diagnosis not present

## 2023-07-19 DIAGNOSIS — K582 Mixed irritable bowel syndrome: Secondary | ICD-10-CM

## 2023-07-19 DIAGNOSIS — Z8673 Personal history of transient ischemic attack (TIA), and cerebral infarction without residual deficits: Secondary | ICD-10-CM | POA: Diagnosis not present

## 2023-07-19 DIAGNOSIS — I10 Essential (primary) hypertension: Secondary | ICD-10-CM

## 2023-07-19 DIAGNOSIS — I700 Atherosclerosis of aorta: Secondary | ICD-10-CM | POA: Diagnosis not present

## 2023-07-19 DIAGNOSIS — J449 Chronic obstructive pulmonary disease, unspecified: Secondary | ICD-10-CM | POA: Diagnosis not present

## 2023-07-19 DIAGNOSIS — F5104 Psychophysiologic insomnia: Secondary | ICD-10-CM

## 2023-07-19 DIAGNOSIS — I5030 Unspecified diastolic (congestive) heart failure: Secondary | ICD-10-CM | POA: Diagnosis not present

## 2023-07-19 DIAGNOSIS — B001 Herpesviral vesicular dermatitis: Secondary | ICD-10-CM | POA: Diagnosis not present

## 2023-07-19 DIAGNOSIS — E538 Deficiency of other specified B group vitamins: Secondary | ICD-10-CM | POA: Diagnosis not present

## 2023-07-19 DIAGNOSIS — F339 Major depressive disorder, recurrent, unspecified: Secondary | ICD-10-CM

## 2023-07-19 DIAGNOSIS — E785 Hyperlipidemia, unspecified: Secondary | ICD-10-CM

## 2023-07-19 MED ORDER — AMITRIPTYLINE HCL 10 MG PO TABS
10.0000 mg | ORAL_TABLET | Freq: Every day | ORAL | 0 refills | Status: DC
Start: 2023-07-19 — End: 2023-10-05

## 2023-07-19 MED ORDER — QUETIAPINE FUMARATE 25 MG PO TABS
25.0000 mg | ORAL_TABLET | Freq: Every day | ORAL | 0 refills | Status: DC
Start: 2023-07-19 — End: 2023-10-19

## 2023-07-19 MED ORDER — DESVENLAFAXINE SUCCINATE ER 50 MG PO TB24
50.0000 mg | ORAL_TABLET | Freq: Every day | ORAL | 0 refills | Status: DC
Start: 2023-07-19 — End: 2023-10-19

## 2023-07-19 MED ORDER — VALACYCLOVIR HCL 1 G PO TABS
ORAL_TABLET | ORAL | 1 refills | Status: DC
Start: 2023-07-19 — End: 2024-02-02

## 2023-07-19 MED ORDER — PANTOPRAZOLE SODIUM 40 MG PO TBEC
40.0000 mg | DELAYED_RELEASE_TABLET | Freq: Every day | ORAL | 0 refills | Status: DC
Start: 2023-07-19 — End: 2024-01-10

## 2023-07-19 MED ORDER — AMLODIPINE BESYLATE 5 MG PO TABS
5.0000 mg | ORAL_TABLET | Freq: Every day | ORAL | 0 refills | Status: DC
Start: 2023-07-19 — End: 2023-08-12

## 2023-07-19 MED ORDER — LOSARTAN POTASSIUM-HCTZ 100-25 MG PO TABS
1.0000 | ORAL_TABLET | Freq: Every day | ORAL | 0 refills | Status: DC
Start: 2023-07-19 — End: 2023-11-08

## 2023-07-19 NOTE — Assessment & Plan Note (Signed)
Continue taking amlodipine 5 mg daily, carvedilol 6.25 mg BID, losartan-hydrochlorothiazide 100-25 mg daily, spironolactone 25 mg daily. Over due for follow up with cardiology. Call and schedule appointment.

## 2023-07-19 NOTE — Assessment & Plan Note (Signed)
Continue taking amlodipine 5 mg daily, carvedilol 6.25 mg BID, losartan-hydrochlorothiazide 100-25 mg daily, spironolactone 25 mg daily

## 2023-07-19 NOTE — Assessment & Plan Note (Signed)
Taken off atorvastatin by cardiology. She needs to schedule appointment with cardiology.

## 2023-07-19 NOTE — Progress Notes (Signed)
BP 126/82   Pulse 79   Temp 98.7 F (37.1 C) (Oral)   Resp 16   Ht 5\' 8"  (1.727 m)   Wt 204 lb 12.8 oz (92.9 kg)   SpO2 97%   BMI 31.14 kg/m    Subjective:    Patient ID: [name], female    DOB: 1961-02-13, 62 y.o.   MRN: [id number]  HPI: [name] is a 61 y.o. female  Chief Complaint  Patient presents with   Hypertension   Gastroesophageal Reflux   COPD   Medication Refill   Insomnia   Patient is non compliant with her visits.  She has run out of her medication many times.  She was last seen on 05/12/2023 by Danelle Berry PA.  She was advised to follow up with her PCP, Dr. Carlynn Purl but she no showed that appointment.     Hypertension:  -Medications: amlodipine 5 mg daily, carvedilol 6.25 mg BID, losartan-hydrochlorothiazide 100-25 mg daily, spironolactone 25 mg daily -Patient is not compliant with above medications and reports no side effects. -Checking BP at home (average): 120s -Denies any SOB, CP, vision changes, LE edema or symptoms of hypotension -Diet: recommend DASH diet  -Exercise: recommend 150 min of physical activity weekly     Heart failure: managed by cardiology Dr. Azucena Cecil, patient last seen on 05/10/2023. Plan from cardiology appointment: Chest pain, echo 4/23 EF 60 to 65%.  Tenderness with left chest palpation noted.  Etiology likely musculoskeletal.  Patient has some risk factors.  Obtain coronary CT. HFpEF, grade 2 diastolic dysfunction, orthopnea, edema.  Hypertension, BP controlled.  Continue Hyzaar 100-25 mg daily, Norvasc 5 mg daily, Coreg 6.25 mg twice daily. Hyperlipidemia, muscle aches.  Stop Lipitor.  If symptoms improve at follow-up visit, plan to start Zetia. Patient no showed her follow up appointment with cardiology. Encouraged patient to call and reschedule appointment.   GERD/IBS:  currently taking pantoprazole 40 mg daily and amitriptyline 10 mg daily.  Patient reports that when she has her medication her symptoms are controlled.    Copd:  not currently on medication.  Endorses shortness of breath occasionally with activity, likely due to her CHF.  Fever blisters: takes valtrex when needed. Usually once a month  HLD/atherosclerosis: -Medications: was on statin, but was discontinued by cardiology, they were going to start her on zetia when she returned for follow up appointment but she did not go.  -Patient is not compliant with above medications and reports no side effects.  -Last lipid panel:  Lipid Panel     Component Value Date/Time   CHOL 120 02/19/2022 0339   TRIG 258 (H) 02/19/2022 0339   HDL 34 (L) 02/19/2022 0339   CHOLHDL 3.5 02/19/2022 0339   VLDL 52 (H) 02/19/2022 0339   LDLCALC 34 02/19/2022 0339   LDLCALC 34 08/07/2020 1247     Depression/insomnia Medication Seroquel 25 mg at bedtime, pristiq 50 mg daily, patient reports that the seroquel has not been as effective. Discussed she can take up to 50 mg at bedtime.  Discuss further with Dr. Carlynn Purl at follow up.  Compliant yes, when she has the medication Side effects none PHQ9 negative GAD negative    07/19/2023    1:42 PM 05/12/2023    2:35 PM 11/09/2022    1:12 PM 09/22/2022    8:48 AM 03/19/2022   10:06 AM  Depression screen PHQ 2/9  Decreased Interest 0 0 1 1 1   Down, Depressed, Hopeless 1 1 1  1  1  PHQ - 2 Score 1 1 2 2 2   Altered sleeping 2 1 2 3 1   Tired, decreased energy 0 1 1 0 1  Change in appetite 0 1 0 0 0  Feeling bad or failure about yourself  0 0 0 0 0  Trouble concentrating 0 0 0 0 0  Moving slowly or fidgety/restless 0 0 0 0 0  Suicidal thoughts 0 0 0 0 0  PHQ-9 Score 3 4 5 5 4   Difficult doing work/chores Not difficult at all Somewhat difficult Somewhat difficult  Somewhat difficult       07/19/2023    1:42 PM 03/19/2022   10:07 AM 11/23/2018    9:47 AM 04/21/2018   10:35 AM  GAD 7 : Generalized Anxiety Score  Nervous, Anxious, on Edge 0 0 0 0  Control/stop worrying 0 0 0 0  Worry too much - different things 0 0 0 0   Trouble relaxing 0 1 0 0  Restless 0 0 0 0  Easily annoyed or irritable 0 0 0 0  Afraid - awful might happen 0 0 0 0  Total GAD 7 Score 0 1 0 0  Anxiety Difficulty Not difficult at all  Not difficult at all Not difficult at all    History of TIA: she went to [facility] on [facility]/18/2022 due to left facial droop and fatigue. Evaluation was negative, found to have anemia , had negative holter monitor. No changes.   B 12 deficiency: takes b12 supplement, last checked in July 2024.    Relevant past medical, surgical, family and social history reviewed and updated as indicated. Interim medical history since our last visit reviewed. Allergies and medications reviewed and updated.  Review of Systems  Constitutional: Negative for fever or weight change.  Respiratory: Negative for cough and shortness of breath.   Cardiovascular: Negative for chest pain or palpitations.  Gastrointestinal: Negative for abdominal pain, no bowel changes.  Musculoskeletal: Negative for gait problem or joint swelling.  Skin: Negative for rash.  Neurological: Negative for dizziness or headache.  No other specific complaints in a complete review of systems (except as listed in HPI above).      Objective:    BP 126/82   Pulse 79   Temp 98.7 F (37.1 C) (Oral)   Resp 16   Ht 5\' 8"  (1.727 m)   Wt 204 lb 12.8 oz (92.9 kg)   SpO2 97%   BMI 31.14 kg/m   Wt Readings from Last 3 Encounters:  07/19/23 204 lb 12.8 oz (92.9 kg)  05/12/23 196 lb 11.2 oz (89.2 kg)  05/10/23 197 lb 9.6 oz (89.6 kg)    Physical Exam  Constitutional: Patient appears well-developed and well-nourished. Obese  No distress.  HEENT: head atraumatic, normocephalic, pupils equal and reactive to light, neck supple Cardiovascular: Normal rate, regular rhythm and normal heart sounds.  No murmur heard. No BLE edema. Pulmonary/Chest: Effort normal and breath sounds normal. No respiratory distress. Abdominal: Soft.  There is no  tenderness. Psychiatric: Patient has a normal mood and affect. behavior is normal. Judgment and thought content normal.   Results for orders placed or performed during the hospital encounter of 05/13/23  CBC with Differential  Result Value Ref Range   WBC 13.8 (H) 4.0 - 10.5 K/uL   RBC 4.09 3.87 - 5.11 MIL/uL   Hemoglobin 12.6 12.0 - 15.0 g/dL   HCT 34.7 42.5 - 95.6 %   MCV 95.4 80.0 - 100.0 fL  MCH 30.8 26.0 - 34.0 pg   MCHC 32.3 30.0 - 36.0 g/dL   RDW 16.1 09.6 - 04.5 %   Platelets 427 (H) 150 - 400 K/uL   nRBC 0.0 0.0 - 0.2 %   Neutrophils Relative % 72 %   Neutro Abs 9.9 (H) 1.7 - 7.7 K/uL   Lymphocytes Relative 21 %   Lymphs Abs 2.9 0.7 - 4.0 K/uL   Monocytes Relative 5 %   Monocytes Absolute 0.8 0.1 - 1.0 K/uL   Eosinophils Relative 1 %   Eosinophils Absolute 0.1 0.0 - 0.5 K/uL   Basophils Relative 0 %   Basophils Absolute 0.1 0.0 - 0.1 K/uL   Immature Granulocytes 1 %   Abs Immature Granulocytes 0.07 0.00 - 0.07 K/uL  Basic metabolic panel  Result Value Ref Range   Sodium 135 135 - 145 mmol/L   Potassium 3.5 3.5 - 5.1 mmol/L   Chloride 98 98 - 111 mmol/L   CO2 24 22 - 32 mmol/L   Glucose, Bld 154 (H) 70 - 99 mg/dL   BUN 29 (H) 6 - 20 mg/dL   Creatinine, Ser 4.09 (H) 0.44 - 1.00 mg/dL   Calcium 9.5 8.9 - 81.1 mg/dL   GFR, Estimated 42 (L) >60 mL/min   Anion gap 13 5 - 15  Lipase, blood  Result Value Ref Range   Lipase 48 11 - 51 U/L  Urinalysis, Routine w reflex microscopic -Urine, Clean Catch  Result Value Ref Range   Color, Urine YELLOW (A) YELLOW   APPearance CLOUDY (A) CLEAR   Specific Gravity, Urine 1.021 1.005 - 1.030   pH 5.0 5.0 - 8.0   Glucose, UA NEGATIVE NEGATIVE mg/dL   Hgb urine dipstick NEGATIVE NEGATIVE   Bilirubin Urine NEGATIVE NEGATIVE   Ketones, ur NEGATIVE NEGATIVE mg/dL   Protein, ur 30 (A) NEGATIVE mg/dL   Nitrite NEGATIVE NEGATIVE   Leukocytes,Ua NEGATIVE NEGATIVE   RBC / HPF 0-5 0 - 5 RBC/hpf   WBC, UA 0-5 0 - 5 WBC/hpf    Bacteria, UA RARE (A) NONE SEEN   Squamous Epithelial / HPF 0-5 0 - 5 /HPF   Mucus PRESENT    Hyaline Casts, UA PRESENT       Assessment & Plan:   Problem List Items Addressed This Visit       Cardiovascular and Mediastinum   Hypertension, benign - Primary    Continue taking amlodipine 5 mg daily, carvedilol 6.25 mg BID, losartan-hydrochlorothiazide 100-25 mg daily, spironolactone 25 mg daily      Relevant Medications   losartan-hydrochlorothiazide (HYZAAR) 100-25 MG tablet   amLODipine (NORVASC) 5 MG tablet   Abdominal aortic atherosclerosis (HCC)    Taken off atorvastatin by cardiology. She needs to schedule appointment with cardiology.      Relevant Medications   losartan-hydrochlorothiazide (HYZAAR) 100-25 MG tablet   amLODipine (NORVASC) 5 MG tablet   Diastolic heart failure (HCC)    Continue taking amlodipine 5 mg daily, carvedilol 6.25 mg BID, losartan-hydrochlorothiazide 100-25 mg daily, spironolactone 25 mg daily. Over due for follow up with cardiology. Call and schedule appointment.       Relevant Medications   losartan-hydrochlorothiazide (HYZAAR) 100-25 MG tablet   amLODipine (NORVASC) 5 MG tablet     Respiratory   COPD, mild (HCC)     Digestive   Irritable bowel syndrome with both constipation and diarrhea   Relevant Medications   pantoprazole (PROTONIX) 40 MG tablet   amitriptyline (ELAVIL) 10 MG tablet  Gastroesophageal reflux disease without esophagitis   Relevant Medications   pantoprazole (PROTONIX) 40 MG tablet     Other   Chronic insomnia    May increase seroquel to 50 mg at bedtime, if no improvement discuss with Dr. Carlynn Purl at next appointment.       Relevant Medications   QUEtiapine (SEROQUEL) 25 MG tablet   Dyslipidemia    Taken off atorvastatin by cardiology. She needs to schedule appointment with cardiology.      Major depression, recurrent, chronic (HCC)    Continue taking pristiq 50 mg daily, may increase seroquel to 50 mg at  bedtime      Relevant Medications   QUEtiapine (SEROQUEL) 25 MG tablet   amitriptyline (ELAVIL) 10 MG tablet   desvenlafaxine (PRISTIQ) 50 MG 24 hr tablet   History of TIA (transient ischemic attack)    Stable no changes      B12 deficiency    Continue b12 supplement      Other Visit Diagnoses     Fever blister       Relevant Medications   valACYclovir (VALTREX) 1000 MG tablet        Follow up plan: Return in about 3 months (around 10/18/2023) for follow up with Dr. Carlynn Purl.

## 2023-07-19 NOTE — Assessment & Plan Note (Signed)
May increase seroquel to 50 mg at bedtime, if no improvement discuss with Dr. Carlynn Purl at next appointment.

## 2023-07-19 NOTE — Assessment & Plan Note (Signed)
Stable -no changes

## 2023-07-19 NOTE — Assessment & Plan Note (Addendum)
Continue taking pristiq 50 mg daily, may increase seroquel to 50 mg at bedtime

## 2023-07-19 NOTE — Assessment & Plan Note (Signed)
Continue b12 supplement.

## 2023-08-12 ENCOUNTER — Ambulatory Visit: Payer: Medicare Other | Admitting: Nurse Practitioner

## 2023-08-12 ENCOUNTER — Encounter: Payer: Self-pay | Admitting: Nurse Practitioner

## 2023-08-12 ENCOUNTER — Other Ambulatory Visit: Payer: Self-pay

## 2023-08-12 VITALS — BP 132/84 | HR 88 | Temp 97.8°F | Resp 16 | Ht 68.0 in | Wt 207.0 lb

## 2023-08-12 DIAGNOSIS — Z0289 Encounter for other administrative examinations: Secondary | ICD-10-CM

## 2023-08-12 DIAGNOSIS — K219 Gastro-esophageal reflux disease without esophagitis: Secondary | ICD-10-CM | POA: Diagnosis not present

## 2023-08-12 DIAGNOSIS — I5030 Unspecified diastolic (congestive) heart failure: Secondary | ICD-10-CM | POA: Diagnosis not present

## 2023-08-12 DIAGNOSIS — M25561 Pain in right knee: Secondary | ICD-10-CM

## 2023-08-12 DIAGNOSIS — Z8673 Personal history of transient ischemic attack (TIA), and cerebral infarction without residual deficits: Secondary | ICD-10-CM | POA: Diagnosis not present

## 2023-08-12 DIAGNOSIS — G8929 Other chronic pain: Secondary | ICD-10-CM | POA: Insufficient documentation

## 2023-08-12 DIAGNOSIS — G894 Chronic pain syndrome: Secondary | ICD-10-CM

## 2023-08-12 DIAGNOSIS — I10 Essential (primary) hypertension: Secondary | ICD-10-CM | POA: Diagnosis not present

## 2023-08-12 DIAGNOSIS — J449 Chronic obstructive pulmonary disease, unspecified: Secondary | ICD-10-CM | POA: Diagnosis not present

## 2023-08-12 DIAGNOSIS — M25562 Pain in left knee: Secondary | ICD-10-CM | POA: Diagnosis not present

## 2023-08-12 DIAGNOSIS — E785 Hyperlipidemia, unspecified: Secondary | ICD-10-CM

## 2023-08-12 MED ORDER — AMLODIPINE BESYLATE 5 MG PO TABS
5.0000 mg | ORAL_TABLET | Freq: Every day | ORAL | 3 refills | Status: DC
Start: 2023-08-12 — End: 2024-08-02

## 2023-08-12 NOTE — Progress Notes (Signed)
BP 132/84   Pulse 88   Temp 97.8 F (36.6 C) (Oral)   Resp 16   Ht 5\' 8"  (1.727 m)   Wt 207 lb (93.9 kg)   SpO2 94%   BMI 31.47 kg/m    Subjective:    Patient ID: [name], female    DOB: Jan 02, 1961, 62 y.o.   MRN: [id number]  HPI: [name] is a 62 y.o. female  Chief Complaint  Patient presents with   Consult    FMLA paperwork   FMLA paper work filled out: fmla paperwork filled out for daughter, so she can bring her to her appointments. Patient reports that she sees her pain management provider once a month and pcp every three months.   Chronic Knee pain: patient reports she wants a refill of meloxicam. This medication is not on her med list and her GFR is low so I explained to the patient that I would not prescribe.  Encouraged patient to take tylenol for her pain. She can also use voltaren gel as needed.   Relevant past medical, surgical, family and social history reviewed and updated as indicated. Interim medical history since our last visit reviewed. Allergies and medications reviewed and updated.  Review of Systems  Constitutional: Negative for fever or weight change.  Respiratory: Negative for cough and shortness of breath.   Cardiovascular: Negative for chest pain or palpitations.  Gastrointestinal: Negative for abdominal pain, no bowel changes.  Musculoskeletal: Negative for gait problem or joint swelling. Bilateral knee pain Skin: Negative for rash.  Neurological: Negative for dizziness or headache.  No other specific complaints in a complete review of systems (except as listed in HPI above).      Objective:    BP 132/84   Pulse 88   Temp 97.8 F (36.6 C) (Oral)   Resp 16   Ht 5\' 8"  (1.727 m)   Wt 207 lb (93.9 kg)   SpO2 94%   BMI 31.47 kg/m   Wt Readings from Last 3 Encounters:  08/12/23 207 lb (93.9 kg)  07/19/23 204 lb 12.8 oz (92.9 kg)  05/12/23 196 lb 11.2 oz (89.2 kg)    Physical Exam  Constitutional: Patient appears  well-developed and well-nourished. Obese  No distress.  HEENT: head atraumatic, normocephalic, pupils equal and reactive to light, neck supple, throat within normal limits Cardiovascular: Normal rate, regular rhythm and normal heart sounds.  No murmur heard. No BLE edema. Pulmonary/Chest: Effort normal and breath sounds normal. No respiratory distress. Abdominal: Soft.  There is no tenderness. Psychiatric: Patient has a normal mood and affect. behavior is normal. Judgment and thought content normal.   Results for orders placed or performed during the hospital encounter of 05/13/23  CBC with Differential  Result Value Ref Range   WBC 13.8 (H) 4.0 - 10.5 K/uL   RBC 4.09 3.87 - 5.11 MIL/uL   Hemoglobin 12.6 12.0 - 15.0 g/dL   HCT 29.5 28.4 - 13.2 %   MCV 95.4 80.0 - 100.0 fL   MCH 30.8 26.0 - 34.0 pg   MCHC 32.3 30.0 - 36.0 g/dL   RDW 44.0 10.2 - 72.5 %   Platelets 427 (H) 150 - 400 K/uL   nRBC 0.0 0.0 - 0.2 %   Neutrophils Relative % 72 %   Neutro Abs 9.9 (H) 1.7 - 7.7 K/uL   Lymphocytes Relative 21 %   Lymphs Abs 2.9 0.7 - 4.0 K/uL   Monocytes Relative 5 %   Monocytes  Absolute 0.8 0.1 - 1.0 K/uL   Eosinophils Relative 1 %   Eosinophils Absolute 0.1 0.0 - 0.5 K/uL   Basophils Relative 0 %   Basophils Absolute 0.1 0.0 - 0.1 K/uL   Immature Granulocytes 1 %   Abs Immature Granulocytes 0.07 0.00 - 0.07 K/uL  Basic metabolic panel  Result Value Ref Range   Sodium 135 135 - 145 mmol/L   Potassium 3.5 3.5 - 5.1 mmol/L   Chloride 98 98 - 111 mmol/L   CO2 24 22 - 32 mmol/L   Glucose, Bld 154 (H) 70 - 99 mg/dL   BUN 29 (H) 6 - 20 mg/dL   Creatinine, Ser 1.61 (H) 0.44 - 1.00 mg/dL   Calcium 9.5 8.9 - 09.6 mg/dL   GFR, Estimated 42 (L) >60 mL/min   Anion gap 13 5 - 15  Lipase, blood  Result Value Ref Range   Lipase 48 11 - 51 U/L  Urinalysis, Routine w reflex microscopic -Urine, Clean Catch  Result Value Ref Range   Color, Urine YELLOW (A) YELLOW   APPearance CLOUDY (A) CLEAR    Specific Gravity, Urine 1.021 1.005 - 1.030   pH 5.0 5.0 - 8.0   Glucose, UA NEGATIVE NEGATIVE mg/dL   Hgb urine dipstick NEGATIVE NEGATIVE   Bilirubin Urine NEGATIVE NEGATIVE   Ketones, ur NEGATIVE NEGATIVE mg/dL   Protein, ur 30 (A) NEGATIVE mg/dL   Nitrite NEGATIVE NEGATIVE   Leukocytes,Ua NEGATIVE NEGATIVE   RBC / HPF 0-5 0 - 5 RBC/hpf   WBC, UA 0-5 0 - 5 WBC/hpf   Bacteria, UA RARE (A) NONE SEEN   Squamous Epithelial / HPF 0-5 0 - 5 /HPF   Mucus PRESENT    Hyaline Casts, UA PRESENT       Assessment & Plan:   1. Encounter for completion of form with patient fmla paperwork filled out for daughter, so she can bring her to her appointments.   2. History of TIA (transient ischemic attack) fmla paperwork filled out for daughter, so she can bring her to her appointments.   3. Hypertension, benign fmla paperwork filled out for daughter, so she can bring her to her appointments.  - amLODipine (NORVASC) 5 MG tablet; Take 1 tablet (5 mg total) by mouth daily.  Dispense: 90 tablet; Refill: 3  4. Diastolic heart failure, unspecified HF chronicity (HCC) fmla paperwork filled out for daughter, so she can bring her to her appointments.   5. COPD, mild (HCC) fmla paperwork filled out for daughter, so she can bring her to her appointments.   6. Dyslipidemia fmla paperwork filled out for daughter, so she can bring her to her appointments.   7. Gastroesophageal reflux disease without esophagitis fmla paperwork filled out for daughter, so she can bring her to her appointments.   8. Chronic pain syndrome fmla paperwork filled out for daughter, so she can bring her to her appointments.   9. Chronic pain of both knees Requested meloxicam, recommend she use voltaren gel  Follow up plan: Return for has follow up scheduled with Dr. Carlynn Purl.

## 2023-08-17 ENCOUNTER — Telehealth: Payer: Self-pay | Admitting: Family Medicine

## 2023-08-17 DIAGNOSIS — M79662 Pain in left lower leg: Secondary | ICD-10-CM | POA: Diagnosis not present

## 2023-08-17 DIAGNOSIS — M25561 Pain in right knee: Secondary | ICD-10-CM | POA: Diagnosis not present

## 2023-08-17 DIAGNOSIS — M25511 Pain in right shoulder: Secondary | ICD-10-CM | POA: Diagnosis not present

## 2023-08-17 DIAGNOSIS — M25562 Pain in left knee: Secondary | ICD-10-CM | POA: Diagnosis not present

## 2023-08-17 DIAGNOSIS — M25512 Pain in left shoulder: Secondary | ICD-10-CM | POA: Diagnosis not present

## 2023-08-17 DIAGNOSIS — G894 Chronic pain syndrome: Secondary | ICD-10-CM | POA: Diagnosis not present

## 2023-08-17 DIAGNOSIS — M545 Low back pain, unspecified: Secondary | ICD-10-CM | POA: Diagnosis not present

## 2023-08-17 DIAGNOSIS — M25551 Pain in right hip: Secondary | ICD-10-CM | POA: Diagnosis not present

## 2023-08-17 DIAGNOSIS — M25552 Pain in left hip: Secondary | ICD-10-CM | POA: Diagnosis not present

## 2023-08-17 DIAGNOSIS — M542 Cervicalgia: Secondary | ICD-10-CM | POA: Diagnosis not present

## 2023-08-17 DIAGNOSIS — M79661 Pain in right lower leg: Secondary | ICD-10-CM | POA: Diagnosis not present

## 2023-08-17 DIAGNOSIS — M170 Bilateral primary osteoarthritis of knee: Secondary | ICD-10-CM | POA: Diagnosis not present

## 2023-08-17 DIAGNOSIS — Z79891 Long term (current) use of opiate analgesic: Secondary | ICD-10-CM | POA: Diagnosis not present

## 2023-08-17 NOTE — Telephone Encounter (Signed)
Copied from CRM [id number]. Topic: General - Inquiry >> Aug 17, 2023 12:48 PM Runell Gess P wrote: Reason for CRM: pt called asking about the FMLA paper for her daughter assisting her with appts .  She siad it was denied because it was too many days a month.  They are asking that it be lowered in number of days so it will be approved by daughters employer.

## 2023-08-17 NOTE — Telephone Encounter (Signed)
Question number 13c need to be changed 3 episodes per month and 13d changed to 1 day

## 2023-08-17 NOTE — Telephone Encounter (Signed)
Spoke to [place] paperwork filled out and Raynelle Fanning will sign on 10/16 and office will fax back

## 2023-09-17 DIAGNOSIS — Z79891 Long term (current) use of opiate analgesic: Secondary | ICD-10-CM | POA: Diagnosis not present

## 2023-09-17 DIAGNOSIS — G894 Chronic pain syndrome: Secondary | ICD-10-CM | POA: Diagnosis not present

## 2023-09-22 DIAGNOSIS — M51369 Other intervertebral disc degeneration, lumbar region without mention of lumbar back pain or lower extremity pain: Secondary | ICD-10-CM | POA: Diagnosis not present

## 2023-09-22 DIAGNOSIS — G894 Chronic pain syndrome: Secondary | ICD-10-CM | POA: Diagnosis not present

## 2023-09-22 DIAGNOSIS — M170 Bilateral primary osteoarthritis of knee: Secondary | ICD-10-CM | POA: Diagnosis not present

## 2023-09-22 DIAGNOSIS — M79662 Pain in left lower leg: Secondary | ICD-10-CM | POA: Diagnosis not present

## 2023-09-24 DIAGNOSIS — M170 Bilateral primary osteoarthritis of knee: Secondary | ICD-10-CM | POA: Diagnosis not present

## 2023-09-24 DIAGNOSIS — Z79891 Long term (current) use of opiate analgesic: Secondary | ICD-10-CM | POA: Diagnosis not present

## 2023-09-24 DIAGNOSIS — G894 Chronic pain syndrome: Secondary | ICD-10-CM | POA: Diagnosis not present

## 2023-09-24 DIAGNOSIS — M79662 Pain in left lower leg: Secondary | ICD-10-CM | POA: Diagnosis not present

## 2023-10-04 DIAGNOSIS — M51369 Other intervertebral disc degeneration, lumbar region without mention of lumbar back pain or lower extremity pain: Secondary | ICD-10-CM | POA: Diagnosis not present

## 2023-10-04 DIAGNOSIS — M170 Bilateral primary osteoarthritis of knee: Secondary | ICD-10-CM | POA: Diagnosis not present

## 2023-10-04 DIAGNOSIS — G894 Chronic pain syndrome: Secondary | ICD-10-CM | POA: Diagnosis not present

## 2023-10-04 DIAGNOSIS — Z79891 Long term (current) use of opiate analgesic: Secondary | ICD-10-CM | POA: Diagnosis not present

## 2023-10-04 DIAGNOSIS — M79662 Pain in left lower leg: Secondary | ICD-10-CM | POA: Diagnosis not present

## 2023-10-04 DIAGNOSIS — M79661 Pain in right lower leg: Secondary | ICD-10-CM | POA: Diagnosis not present

## 2023-10-05 ENCOUNTER — Other Ambulatory Visit: Payer: Self-pay | Admitting: Family Medicine

## 2023-10-05 ENCOUNTER — Other Ambulatory Visit: Payer: Self-pay | Admitting: Medical

## 2023-10-05 DIAGNOSIS — K582 Mixed irritable bowel syndrome: Secondary | ICD-10-CM

## 2023-10-05 NOTE — Telephone Encounter (Signed)
Requested Prescriptions  Pending Prescriptions Disp Refills   amitriptyline (ELAVIL) 10 MG tablet [Pharmacy Med Name: AMITRIPTYLINE 10MG  TABLETS] 90 tablet 1    Sig: TAKE 1 TABLET(10 MG) BY MOUTH AT BEDTIME     Psychiatry:  Antidepressants - Heterocyclics (TCAs) Passed - 10/05/2023  5:00 PM      Passed - Completed PHQ-2 or PHQ-9 in the last 360 days      Passed - Valid encounter within last 6 months    Recent Outpatient Visits           1 month ago Encounter for completion of form with patient   [facility] Berniece Salines, FNP   2 months ago Hypertension, benign   [facility] Berniece Salines, FNP   4 months ago B12 deficiency   [facility] Danelle Berry, PA-C   5 months ago Hypertension, benign   [facility] Danelle Berry, PA-C   11 months ago Medicare annual wellness visit, subsequent   [facility] Caro Laroche, DO       Future Appointments             In 2 weeks Alba Cory, MD [facility]
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
## 2023-10-19 ENCOUNTER — Ambulatory Visit (INDEPENDENT_AMBULATORY_CARE_PROVIDER_SITE_OTHER): Payer: Medicare Other | Admitting: Family Medicine

## 2023-10-19 ENCOUNTER — Encounter: Payer: Self-pay | Admitting: Family Medicine

## 2023-10-19 VITALS — BP 110/72 | HR 88 | Temp 98.0°F | Resp 16 | Ht 68.0 in | Wt 213.4 lb

## 2023-10-19 DIAGNOSIS — G894 Chronic pain syndrome: Secondary | ICD-10-CM

## 2023-10-19 DIAGNOSIS — N1831 Chronic kidney disease, stage 3a: Secondary | ICD-10-CM

## 2023-10-19 DIAGNOSIS — I10 Essential (primary) hypertension: Secondary | ICD-10-CM

## 2023-10-19 DIAGNOSIS — F5104 Psychophysiologic insomnia: Secondary | ICD-10-CM

## 2023-10-19 DIAGNOSIS — Z1231 Encounter for screening mammogram for malignant neoplasm of breast: Secondary | ICD-10-CM | POA: Diagnosis not present

## 2023-10-19 DIAGNOSIS — F339 Major depressive disorder, recurrent, unspecified: Secondary | ICD-10-CM | POA: Diagnosis not present

## 2023-10-19 DIAGNOSIS — K582 Mixed irritable bowel syndrome: Secondary | ICD-10-CM

## 2023-10-19 DIAGNOSIS — I5030 Unspecified diastolic (congestive) heart failure: Secondary | ICD-10-CM | POA: Diagnosis not present

## 2023-10-19 DIAGNOSIS — I700 Atherosclerosis of aorta: Secondary | ICD-10-CM

## 2023-10-19 DIAGNOSIS — J449 Chronic obstructive pulmonary disease, unspecified: Secondary | ICD-10-CM | POA: Diagnosis not present

## 2023-10-19 DIAGNOSIS — R739 Hyperglycemia, unspecified: Secondary | ICD-10-CM | POA: Diagnosis not present

## 2023-10-19 DIAGNOSIS — Z23 Encounter for immunization: Secondary | ICD-10-CM | POA: Diagnosis not present

## 2023-10-19 MED ORDER — ATORVASTATIN CALCIUM 20 MG PO TABS: 20.0000 mg | ORAL_TABLET | Freq: Every day | ORAL | 0 refills | Status: DC

## 2023-10-19 MED ORDER — CARIPRAZINE HCL 1.5 MG PO CAPS
1.5000 mg | ORAL_CAPSULE | Freq: Every day | ORAL | 0 refills | Status: DC
Start: 2023-10-19 — End: 2024-02-02

## 2023-10-19 MED ORDER — DESVENLAFAXINE SUCCINATE ER 50 MG PO TB24
50.0000 mg | ORAL_TABLET | Freq: Every day | ORAL | 0 refills | Status: DC
Start: 2023-10-19 — End: 2024-01-31

## 2023-10-19 MED ORDER — TRAZODONE HCL 50 MG PO TABS
25.0000 mg | ORAL_TABLET | Freq: Every evening | ORAL | 0 refills | Status: DC | PRN
Start: 2023-10-19 — End: 2024-02-02

## 2023-10-19 MED ORDER — AMITRIPTYLINE HCL 10 MG PO TABS
10.0000 mg | ORAL_TABLET | Freq: Every day | ORAL | 0 refills | Status: DC
Start: 2023-10-19 — End: 2024-08-02

## 2023-10-20 ENCOUNTER — Telehealth: Payer: Self-pay

## 2023-10-20 LAB — CBC WITH DIFFERENTIAL/PLATELET
Absolute Lymphocytes: 2073 {cells}/uL (ref 850–3900)
Absolute Monocytes: 576 {cells}/uL (ref 200–950)
Basophils Absolute: 26 {cells}/uL (ref 0–200)
Basophils Relative: 0.3 %
Eosinophils Absolute: 146 {cells}/uL (ref 15–500)
Eosinophils Relative: 1.7 %
HCT: 36 % (ref 35.0–45.0)
Hemoglobin: 11.5 g/dL — ABNORMAL LOW (ref 11.7–15.5)
MCH: 30.6 pg (ref 27.0–33.0)
MCHC: 31.9 g/dL — ABNORMAL LOW (ref 32.0–36.0)
MCV: 95.7 fL (ref 80.0–100.0)
MPV: 11.6 fL (ref 7.5–12.5)
Monocytes Relative: 6.7 %
Neutro Abs: 5779 {cells}/uL (ref 1500–7800)
Neutrophils Relative %: 67.2 %
Platelets: 289 10*3/uL (ref 140–400)
RBC: 3.76 10*6/uL — ABNORMAL LOW (ref 3.80–5.10)
RDW: 12.3 % (ref 11.0–15.0)
Total Lymphocyte: 24.1 %
WBC: 8.6 10*3/uL (ref 3.8–10.8)

## 2023-10-20 LAB — COMPLETE METABOLIC PANEL WITH GFR
AG Ratio: 1.7 (calc) (ref 1.0–2.5)
ALT: 34 U/L — ABNORMAL HIGH (ref 6–29)
AST: 45 U/L — ABNORMAL HIGH (ref 10–35)
Albumin: 4.4 g/dL (ref 3.6–5.1)
Alkaline phosphatase (APISO): 105 U/L (ref 37–153)
BUN: 12 mg/dL (ref 7–25)
CO2: 26 mmol/L (ref 20–32)
Calcium: 9.8 mg/dL (ref 8.6–10.4)
Chloride: 105 mmol/L (ref 98–110)
Creat: 0.84 mg/dL (ref 0.50–1.05)
Globulin: 2.6 g/dL (ref 1.9–3.7)
Glucose, Bld: 97 mg/dL (ref 65–99)
Potassium: 4.2 mmol/L (ref 3.5–5.3)
Sodium: 141 mmol/L (ref 135–146)
Total Bilirubin: 0.3 mg/dL (ref 0.2–1.2)
Total Protein: 7 g/dL (ref 6.1–8.1)
eGFR: 80 mL/min/{1.73_m2} (ref >=60)

## 2023-10-20 LAB — PARATHYROID HORMONE, INTACT (NO CA): PTH: 27 pg/mL (ref 16–77)

## 2023-10-20 LAB — LIPID PANEL
Cholesterol: 154 mg/dL (ref <200)
HDL: 47 mg/dL — ABNORMAL LOW (ref >=50)
LDL Cholesterol (Calc): 69 mg/dL
Non-HDL Cholesterol (Calc): 107 mg/dL (ref <130)
Total CHOL/HDL Ratio: 3.3 (calc) (ref <5.0)
Triglycerides: 318 mg/dL — ABNORMAL HIGH (ref <150)

## 2023-10-20 LAB — MICROALBUMIN / CREATININE URINE RATIO
Creatinine, Urine: 44 mg/dL (ref 20–275)
Microalb, Ur: <0.2 mg/dL

## 2023-10-20 LAB — HEMOGLOBIN A1C
Hgb A1c MFr Bld: 6.5 %{Hb} — ABNORMAL HIGH (ref <5.7)
Mean Plasma Glucose: 140 mg/dL
eAG (mmol/L): 7.7 mmol/L

## 2023-10-20 LAB — VITAMIN D 25 HYDROXY (VIT D DEFICIENCY, FRACTURES): Vit D, 25-Hydroxy: 36 ng/mL (ref 30–100)

## 2023-10-20 NOTE — Telephone Encounter (Signed)
PA done through covermymeds waiting on determination. 

## 2023-11-01 DIAGNOSIS — M79662 Pain in left lower leg: Secondary | ICD-10-CM | POA: Diagnosis not present

## 2023-11-01 DIAGNOSIS — G894 Chronic pain syndrome: Secondary | ICD-10-CM | POA: Diagnosis not present

## 2023-11-01 DIAGNOSIS — Z79891 Long term (current) use of opiate analgesic: Secondary | ICD-10-CM | POA: Diagnosis not present

## 2023-11-01 DIAGNOSIS — M170 Bilateral primary osteoarthritis of knee: Secondary | ICD-10-CM | POA: Diagnosis not present

## 2023-11-01 DIAGNOSIS — M5136 Other intervertebral disc degeneration, lumbar region with discogenic back pain only: Secondary | ICD-10-CM | POA: Diagnosis not present

## 2023-11-04 ENCOUNTER — Other Ambulatory Visit: Payer: Self-pay | Admitting: Nurse Practitioner

## 2023-11-04 DIAGNOSIS — I10 Essential (primary) hypertension: Secondary | ICD-10-CM

## 2023-11-08 NOTE — Telephone Encounter (Signed)
 Requested by interface surescripts. Future visit in 2 months .   Requested Prescriptions  Pending Prescriptions Disp Refills   losartan -hydrochlorothiazide  (HYZAAR) 100-25 MG tablet [Pharmacy Med Name: LOSARTAN /HCTZ 100/25MG  TABLETS] 90 tablet 0    Sig: TAKE 1 TABLET BY MOUTH DAILY     Cardiovascular: ARB + Diuretic Combos Passed - 11/08/2023 12:59 PM      Passed - K in normal range and within 180 days    Potassium  Date Value Ref Range Status  10/19/2023 4.2 3.5 - 5.3 mmol/L Final         Passed - Na in normal range and within 180 days    Sodium  Date Value Ref Range Status  10/19/2023 141 135 - 146 mmol/L Final         Passed - Cr in normal range and within 180 days    Creat  Date Value Ref Range Status  10/19/2023 0.84 0.50 - 1.05 mg/dL Final   Creatinine, Urine  Date Value Ref Range Status  10/19/2023 44 20 - 275 mg/dL Final         Passed - eGFR is 10 or above and within 180 days    GFR, Est African American  Date Value Ref Range Status  08/07/2020 118 > OR = 60 mL/min/1.82m2 Final   GFR, Est Non African American  Date Value Ref Range Status  08/07/2020 102 > OR = 60 mL/min/1.88m2 Final   GFR, Estimated  Date Value Ref Range Status  05/13/2023 42 (L) >60 mL/min Final    Comment:    (NOTE) Calculated using the CKD-EPI Creatinine Equation (2021)    eGFR  Date Value Ref Range Status  10/19/2023 80 > OR = 60 mL/min/1.52m2 Final         Passed - Patient is not pregnant      Passed - Last BP in normal range    BP Readings from Last 1 Encounters:  10/19/23 110/72         Passed - Valid encounter within last 6 months    Recent Outpatient Visits           2 weeks ago Major depression, recurrent, chronic (HCC)   [facility] Glenard Mire, MD   2 months ago Encounter for completion of form with patient   [facility] Gareth Mliss FALCON, FNP   3 months ago Hypertension, benign   [facility] Gareth Mliss FALCON, FNP   6 months ago B12 deficiency   [facility] Leavy Mole, PA-C   6 months ago Hypertension, benign   [facility] Leavy Mole, PA-C       Future Appointments             In 2 months Sowles, Krichna, MD [facility]

## 2023-11-27 DIAGNOSIS — M7918 Myalgia, other site: Secondary | ICD-10-CM | POA: Diagnosis not present

## 2023-11-27 DIAGNOSIS — M5416 Radiculopathy, lumbar region: Secondary | ICD-10-CM | POA: Diagnosis not present

## 2023-12-02 DIAGNOSIS — G894 Chronic pain syndrome: Secondary | ICD-10-CM | POA: Diagnosis not present

## 2023-12-02 DIAGNOSIS — Z79891 Long term (current) use of opiate analgesic: Secondary | ICD-10-CM | POA: Diagnosis not present

## 2023-12-08 ENCOUNTER — Emergency Department: Payer: Medicare Other

## 2023-12-08 ENCOUNTER — Other Ambulatory Visit: Payer: Self-pay

## 2023-12-08 ENCOUNTER — Emergency Department
Admission: EM | Admit: 2023-12-08 | Discharge: 2023-12-08 | Disposition: A | Discharge status: Home / Self Care | Payer: Medicare Other | Attending: Emergency Medicine | Admitting: Emergency Medicine

## 2023-12-08 DIAGNOSIS — G9340 Encephalopathy, unspecified: Secondary | ICD-10-CM | POA: Insufficient documentation

## 2023-12-08 DIAGNOSIS — N1832 Chronic kidney disease, stage 3b: Secondary | ICD-10-CM | POA: Insufficient documentation

## 2023-12-08 DIAGNOSIS — J449 Chronic obstructive pulmonary disease, unspecified: Secondary | ICD-10-CM | POA: Diagnosis not present

## 2023-12-08 DIAGNOSIS — G9341 Metabolic encephalopathy: Secondary | ICD-10-CM | POA: Diagnosis not present

## 2023-12-08 DIAGNOSIS — R9082 White matter disease, unspecified: Secondary | ICD-10-CM | POA: Diagnosis not present

## 2023-12-08 DIAGNOSIS — I130 Hypertensive heart and chronic kidney disease with heart failure and stage 1 through stage 4 chronic kidney disease, or unspecified chronic kidney disease: Secondary | ICD-10-CM | POA: Diagnosis not present

## 2023-12-08 DIAGNOSIS — R29818 Other symptoms and signs involving the nervous system: Secondary | ICD-10-CM | POA: Diagnosis not present

## 2023-12-08 DIAGNOSIS — E785 Hyperlipidemia, unspecified: Secondary | ICD-10-CM | POA: Diagnosis not present

## 2023-12-08 DIAGNOSIS — R4182 Altered mental status, unspecified: Secondary | ICD-10-CM | POA: Diagnosis not present

## 2023-12-08 DIAGNOSIS — G4700 Insomnia, unspecified: Secondary | ICD-10-CM | POA: Diagnosis not present

## 2023-12-08 DIAGNOSIS — I509 Heart failure, unspecified: Secondary | ICD-10-CM | POA: Diagnosis not present

## 2023-12-08 LAB — URINALYSIS, W/ REFLEX TO CULTURE (INFECTION SUSPECTED)
Bilirubin Urine: NEGATIVE
Glucose, UA: NEGATIVE mg/dL
Hgb urine dipstick: NEGATIVE
Ketones, ur: NEGATIVE mg/dL
Nitrite: NEGATIVE
Protein, ur: NEGATIVE mg/dL
Specific Gravity, Urine: 1.013 (ref 1.005–1.030)
pH: 5 (ref 5.0–8.0)

## 2023-12-08 LAB — CBC
HCT: 44.8 % (ref 36.0–46.0)
Hemoglobin: 15.1 g/dL — ABNORMAL HIGH (ref 12.0–15.0)
MCH: 31.7 pg (ref 26.0–34.0)
MCHC: 33.7 g/dL (ref 30.0–36.0)
MCV: 93.9 fL (ref 80.0–100.0)
Platelets: 379 10*3/uL (ref 150–400)
RBC: 4.77 MIL/uL (ref 3.87–5.11)
RDW: 12.4 % (ref 11.5–15.5)
WBC: 10.7 10*3/uL — ABNORMAL HIGH (ref 4.0–10.5)
nRBC: 0 % (ref 0.0–0.2)

## 2023-12-08 LAB — ETHANOL: Alcohol, Ethyl (B): <10 mg/dL (ref <10)

## 2023-12-08 LAB — DIFFERENTIAL
Abs Immature Granulocytes: 0.03 10*3/uL (ref 0.00–0.07)
Basophils Absolute: 0.1 10*3/uL (ref 0.0–0.1)
Basophils Relative: 1 %
Eosinophils Absolute: 0.1 10*3/uL (ref 0.0–0.5)
Eosinophils Relative: 1 %
Immature Granulocytes: 0 %
Lymphocytes Relative: 34 %
Lymphs Abs: 3.6 10*3/uL (ref 0.7–4.0)
Monocytes Absolute: 0.7 10*3/uL (ref 0.1–1.0)
Monocytes Relative: 6 %
Neutro Abs: 6.2 10*3/uL (ref 1.7–7.7)
Neutrophils Relative %: 58 %

## 2023-12-08 LAB — COMPREHENSIVE METABOLIC PANEL
ALT: 31 U/L (ref 0–44)
AST: 54 U/L — ABNORMAL HIGH (ref 15–41)
Albumin: 4.6 g/dL (ref 3.5–5.0)
Alkaline Phosphatase: 103 U/L (ref 38–126)
Anion gap: 13 (ref 5–15)
BUN: 13 mg/dL (ref 8–23)
CO2: 18 mmol/L — ABNORMAL LOW (ref 22–32)
Calcium: 9.4 mg/dL (ref 8.9–10.3)
Chloride: 103 mmol/L (ref 98–111)
Creatinine, Ser: 1.19 mg/dL — ABNORMAL HIGH (ref 0.44–1.00)
GFR, Estimated: 52 mL/min — ABNORMAL LOW (ref >60)
Glucose, Bld: 111 mg/dL — ABNORMAL HIGH (ref 70–99)
Potassium: 4.5 mmol/L (ref 3.5–5.1)
Sodium: 134 mmol/L — ABNORMAL LOW (ref 135–145)
Total Bilirubin: 1.1 mg/dL (ref 0.0–1.2)
Total Protein: 8.7 g/dL — ABNORMAL HIGH (ref 6.5–8.1)

## 2023-12-08 LAB — APTT: aPTT: 22 s — ABNORMAL LOW (ref 24–36)

## 2023-12-08 LAB — PROTIME-INR
INR: 1 (ref 0.8–1.2)
Prothrombin Time: 13.7 s (ref 11.4–15.2)

## 2023-12-08 LAB — CBG MONITORING, ED: Glucose-Capillary: 84 mg/dL (ref 70–99)

## 2023-12-08 MED ORDER — SODIUM CHLORIDE 0.9% FLUSH: 3.0000 mL | Freq: Once | INTRAVENOUS | Status: DC

## 2023-12-08 NOTE — Consult Note (Signed)
 NEUROLOGY CONSULT NOTE   Date of service: December 08, 2023 Patient Name: [name] MRN:  [id number] DOB:  [birth year] Chief Complaint: Confusion per husband I do not know per patient Requesting Provider: Levander Slate, MD  History of Present Illness  [name] is a 63 y.o. female with hx of congestive heart failure, hyperlipidemia, prior possible TIA, insomnia, major depression, COPD, CKD stage III a,  History was obtained from nursing staff as patient was encephalopathic and husband was not available at the time of my evaluation (he had stepped out to retrieve something from the car).  Per history reported to me, patient had a fall within the last 1 to 2 weeks and has had increased pain since that time.  Her husband had gone at 9 AM to pick up new medications for her and then returned at 9:30 AM to give her these medications (he is not sure exactly which medication he gave her however).  Subsequently at 10 AM he noted she was acting confused and brought her to the ED for further evaluation.  In the initial triage examination there was concern for some right-sided drift for which code stroke was activated.  She had no focal weakness on my evaluation  Of note she had a similar presentation with primarily confusional symptoms in April 2023 at which time she did have a full stroke workup, MRI was negative  LKW: 9:30 AM Modified rankin score: 2 secondary to chronic pain IV Thrombolysis: No, exam more consistent with encephalopathy than stroke and MRI confirmed no stroke EVT: No, exam not consistent with LVO   NIHSS components Score: Comment  1a Level of Conscious 0[x]  1[]  2[]  3[]      1b LOC Questions 0[x]  1[]  2[]       1c LOC Commands 0[x]  1[]  2[]       2 Best Gaze 0[x]  1[]  2[]       3 Visual 0[x]  1[]  2[]  3[]      4 Facial Palsy 0[x]  1[]  2[]  3[]      5a Motor Arm - left 0[x]  1[]  2[]  3[]  4[]  UN[]    5b Motor Arm - Right 0[x]  1[]  2[]  3[]  4[]  UN[]    6a Motor Leg - Left 0[x]  1[]  2[]  3[]  4[]   UN[]    6b Motor Leg - Right 0[x]  1[]  2[]  3[]  4[]  UN[]    7 Limb Ataxia 0[x]  1[]  2[]  3[]  UN[]     8 Sensory 0[]  1[x]  2[]  UN[]      9 Best Language 0[]  1[x]  2[]  3[]      10 Dysarthria 0[x]  1[]  2[]  UN[]      11 Extinct. and Inattention 0[x]  1[]  2[]       TOTAL:  2      ROS  Unable to ascertain due to confusion  Past History   Past Medical History:  Diagnosis Date   Abdominal aortic atherosclerosis (HCC)    Anemia    Arthritis    bilateral knees   Bronchitis    Carpal tunnel syndrome, bilateral    Contact dermatitis and other eczema, due to unspecified cause    COPD (chronic obstructive pulmonary disease) (HCC)    DDD (degenerative disc disease)    Depressive disorder    Diverticulosis    Dysmetabolic syndrome X    Eczema    GERD (gastroesophageal reflux disease)    Hyperlipidemia    Hypertension    IBS (irritable bowel syndrome)    Insomnia    Lumbago    Nonspecific abnormal electrocardiogram (ECG) (EKG)    Osteoarthritis of  both knees    Other ovarian failure(256.39)    Peptic ulcer    Postmenopausal atrophic vaginitis    Symptomatic menopausal or female climacteric states    Unspecified vitamin D  deficiency     Past Surgical History:  Procedure Laterality Date   ACHILLES TENDON SURGERY Left 01/02/2022   Procedure: ACHILLES TENDON REPAIR-SECONDARY;  Surgeon: Ashley Soulier, DPM;  Location: [facility];  Service: Podiatry;  Laterality: Left;   BACK SURGERY     BREAST EXCISIONAL BIOPSY Left ?   neg   BREAST LUMPECTOMY Left 01/08/2021   Procedure: BREAST LUMPECTOMY;  Surgeon: Lane Shope, MD;  Location: [facility];  Service: General;  Laterality: Left;   CARPAL TUNNEL RELEASE Left 08/15/2018   Procedure: CARPAL TUNNEL RELEASE;  Surgeon: Cleotilde Barrio, MD;  Location: [facility];  Service: Orthopedics;  Laterality: Left;   CERVICAL DISCECTOMY     x 2; metal plate   CHOLECYSTECTOMY     COLONOSCOPY WITH PROPOFOL  N/A 08/08/2018   Procedure: COLONOSCOPY WITH PROPOFOL ;  Surgeon:  Janalyn Keene NOVAK, MD;  Location: [facility];  Service: Endoscopy;  Laterality: N/A;   COLONOSCOPY WITH PROPOFOL  N/A 12/01/2021   Procedure: COLONOSCOPY WITH PROPOFOL ;  Surgeon: Unk Corinn Skiff, MD;  Location: [facility] END[facility]COPY;  Service: Gastroenterology;  Laterality: N/A;   DILATATION & CURETTAGE/HYSTEROSCOPY WITH MYOSURE N/A 03/21/2015   Procedure: DILATATION & CURETTAGE/HYSTEROSCOPY;  Surgeon: Bebe Furry, MD;  Location: [facility];  Service: Gynecology;  Laterality: N/A;   DILATION AND CURETTAGE OF UTERUS     ENDOMETRIAL ABLATION     ESOPHAGOGASTRODUODENOSCOPY (EGD) WITH PROPOFOL  N/A 04/13/2018   Procedure: ESOPHAGOGASTRODUODENOSCOPY (EGD) WITH PROPOFOL ;  Surgeon: Janalyn Keene NOVAK, MD;  Location: [facility];  Service: Endoscopy;  Laterality: N/A;   ESOPHAGOGASTRODUODENOSCOPY (EGD) WITH PROPOFOL  N/A 08/08/2018   Procedure: ESOPHAGOGASTRODUODENOSCOPY (EGD) WITH PROPOFOL ;  Surgeon: Janalyn Keene NOVAK, MD;  Location: [facility];  Service: Endoscopy;  Laterality: N/A;   ESOPHAGOGASTRODUODENOSCOPY (EGD) WITH PROPOFOL  N/A 03/25/2022   Procedure: ESOPHAGOGASTRODUODENOSCOPY (EGD) WITH PROPOFOL ;  Surgeon: Unk Corinn Skiff, MD;  Location: [facility] ENDOSCOPY;  Service: Gastroenterology;  Laterality: N/A;   ESOPHAGOGASTRODUODENOSCOPY (EGD) WITH PROPOFOL  N/A 03/27/2022   Procedure: ESOPHAGOGASTRODUODENOSCOPY (EGD) WITH PROPOFOL ;  Surgeon: Unk Corinn Skiff, MD;  Location: [facility] ENDOSCOPY;  Service: Gastroenterology;  Laterality: N/A;   JOINT REPLACEMENT     OSTECTOMY Left 01/02/2022   Procedure: CALCANEAL EXOSTECTOMY;  Surgeon: Ashley Soulier, DPM;  Location: [facility];  Service: Podiatry;  Laterality: Left;   SPINAL FUSION     lumbar x2   TONSILLECTOMY     TOTAL KNEE ARTHROPLASTY Left 04/12/2019   Procedure: TOTAL KNEE ARTHROPLASTY;  Surgeon: Leora Lynwood SAUNDERS, MD;  Location: [facility];  Service: Orthopedics;  Laterality: Left;   TOTAL KNEE ARTHROPLASTY Right 04/17/2020   Procedure: TOTAL  KNEE ARTHROPLASTY;  Surgeon: Leora Lynwood SAUNDERS, MD;  Location: [facility];  Service: Orthopedics;  Laterality: Right;   TUBAL LIGATION      Family History: Family History  Problem Relation Age of Onset   Heart disease Mother    Thyroid  cancer Mother    Congestive Heart Failure Mother    Hypertension Father    Heart failure Maternal Uncle    Diabetes Maternal Uncle    Kidney disease Maternal Uncle    Breast cancer Maternal Grandmother    Heart failure Maternal Grandfather    Depression Daughter    Asthma Daughter     Social History  reports that she quit smoking about 4 years ago. Her smoking use included  cigarettes. She started smoking about 21 years ago. She has a 20 pack-year smoking history. She quit smokeless tobacco use about 20 months ago. She reports current alcohol  use. She reports that she does not use drugs.  No Known Allergies  Medications   Current Facility-Administered Medications:    sodium chloride  flush (NS) 0.9 % injection 3 mL, 3 mL, Intravenous, Once, Triplett, Cari B, FNP  Current Outpatient Medications:    amitriptyline  (ELAVIL ) 10 MG tablet, Take 1 tablet (10 mg total) by mouth at bedtime., Disp: 90 tablet, Rfl: 0   amLODipine  (NORVASC ) 5 MG tablet, Take 1 tablet (5 mg total) by mouth daily., Disp: 90 tablet, Rfl: 3   aspirin  EC 81 MG EC tablet, Take 1 tablet (81 mg total) by mouth daily. Swallow whole., Disp: 30 tablet, Rfl: 11   atorvastatin  (LIPITOR) 20 MG tablet, Take 1 tablet (20 mg total) by mouth daily., Disp: 90 tablet, Rfl: 0   cariprazine  (VRAYLAR ) 1.5 MG capsule, Take 1 capsule (1.5 mg total) by mouth daily., Disp: 90 capsule, Rfl: 0   carvedilol  (COREG ) 6.25 MG tablet, TAKE 1 TABLET(6.25 MG) BY MOUTH TWICE DAILY, Disp: 180 tablet, Rfl: 2   cholecalciferol  (VITAMIN D ) 1000 units tablet, Take 1,000 Units by mouth daily., Disp: , Rfl:    Cyanocobalamin  (B-12) 1000 MCG SUBL, Place 1 each under the tongue 3 (three) times a week., Disp: , Rfl:     desvenlafaxine  (PRISTIQ ) 50 MG 24 hr tablet, Take 1 tablet (50 mg total) by mouth daily., Disp: 90 tablet, Rfl: 0   gabapentin  (NEURONTIN ) 300 MG capsule, Take 300 mg by mouth every 6 (six) hours as needed (Nerve pain)., Disp: , Rfl:    losartan -hydrochlorothiazide  (HYZAAR) 100-25 MG tablet, TAKE 1 TABLET BY MOUTH DAILY, Disp: 90 tablet, Rfl: 0   metFORMIN  (GLUCOPHAGE ) 500 MG tablet, Take 1 tablet (500 mg total) by mouth daily with breakfast., Disp: 90 tablet, Rfl: 1   Multiple Vitamins-Minerals (MULTIVITAMIN WITH MINERALS) tablet, Take 1 tablet by mouth every other day., Disp: , Rfl:    NARCAN 4 MG/0.1ML LIQD nasal spray kit, Place 1 spray into the nose once., Disp: , Rfl:    ondansetron  (ZOFRAN -ODT) 4 MG disintegrating tablet, Take 1 tablet (4 mg total) by mouth every 8 (eight) hours as needed for nausea or vomiting., Disp: 20 tablet, Rfl: 0   Oxycodone  HCl 20 MG TABS, Take 20 mg by mouth every 4 (four) hours as needed (pain)., Disp: , Rfl:    pantoprazole  (PROTONIX ) 40 MG tablet, Take 1 tablet (40 mg total) by mouth daily., Disp: 90 tablet, Rfl: 0   spironolactone  (ALDACTONE ) 25 MG tablet, TAKE 1/2 TABLET(12.5 MG) BY MOUTH DAILY, Disp: 45 tablet, Rfl: 1   tiZANidine  (ZANAFLEX ) 4 MG tablet, Take 4 mg by mouth every 8 (eight) hours., Disp: , Rfl:    traZODone  (DESYREL ) 50 MG tablet, Take 0.5-1 tablets (25-50 mg total) by mouth at bedtime as needed for sleep., Disp: 90 tablet, Rfl: 0   valACYclovir  (VALTREX ) 1000 MG tablet, TAKE 1 TABLET(1000 MG) BY MOUTH TWICE DAILY AS NEEDED, Disp: 10 tablet, Rfl: 1  Vitals   Vitals:   12/08/23 1236  BP: (!) 123/93  Pulse: 100  Resp: 18  Temp: 98 F (36.7 C)  TempSrc: Oral  SpO2: 93%    There is no height or weight on file to calculate BMI.  Physical Exam   Constitutional: Appears mildly fatigued Psych: Affect flat but cooperative Eyes: No scleral injection.  HENT: No OP  obstruction.  Head: Normocephalic.  Cardiovascular: Perfusing extremities  well Respiratory: Effort normal, non-labored breathing.  GI: Soft.  No distension. There is no tenderness.  Skin: WDI.   Neurologic Examination   Physical Exam  Constitutional: Wincing with any movement due to back pain Psych: Affect appropriate to situation Eyes: No scleral injection HENT: No OP obstrucion MSK: no joint deformities.  Cardiovascular: Normal rate and regular rhythm.  Respiratory: Effort normal, non-labored breathing GI: Soft.  No distension. There is no tenderness.  Skin: WDI  Neuro: Mental Status: Patient is awake, mildly drowsy, oriented to person, place, month, year, but gives limited details of the situation.  No clear neglect, exam is limited by poor attention/concentration including unreliable reporting of sensory stimulation.  Her attention/concentration also limits her language examination, she does not name Hammack and she provides poor details of the cookie jar picture Cranial Nerves: II: Visual Fields are full.  III,IV, VI: EOMI  V: Facial sensation is symmetric to eyelash brush VII: Facial movement is symmetric.  VIII: hearing is intact to voice Motor: No drift of the bilateral upper or lower extremities. Sensory: Unreliable responses to light sensory stimulus, does not report left and right reliably Cerebellar: FNF intact bilaterally, heel-to-shin pain limited    Labs/Imaging/Neurodiagnostic studies   CBC:  Recent Labs  Lab 12-25-23 1241  WBC 10.7*  NEUTROABS 6.2  HGB 15.1*  HCT 44.8  MCV 93.9  PLT 379   Basic Metabolic Panel:  Lab Results  Component Value Date   NA 141 10/19/2023   K 4.2 10/19/2023   CO2 26 10/19/2023   GLUCOSE 97 10/19/2023   BUN 12 10/19/2023   CREATININE 0.84 10/19/2023   CALCIUM  9.8 10/19/2023   GFRNONAA 42 (L) 05/13/2023   GFRAA 118 08/07/2020   Lipid Panel:  Lab Results  Component Value Date   LDLCALC 69 10/19/2023   HgbA1c:  Lab Results  Component Value Date   HGBA1C 6.5 (H) 10/19/2023   Urine  Drug Screen: No results found for: LABOPIA, COCAINSCRNUR, LABBENZ, AMPHETMU, THCU, LABBARB   Alcohol  Level     Component Value Date/Time   ETH <10 02/18/2022 1450   INR  Lab Results  Component Value Date   INR 1.0 12-25-2023   APTT  Lab Results  Component Value Date   APTT 22 (L) Dec 25, 2023   AED levels: No results found for: PHENYTOIN, ZONISAMIDE, LAMOTRIGINE, LEVETIRACETA  CT Head without contrast(Personally reviewed): No acute intracranial process  MRI Brain(Personally reviewed): No acute intracranial process on my review, but radiology report is pending   ASSESSMENT   [name] is a 63 y.o. female presenting with encephalopathy in the setting of recently taking some sedating medications.  Of note she is on multiple sedating medications and has had a similar presentation in the past.   RECOMMENDATIONS   Emergent code stroke recommendations -MRI brain to rule out acute intracranial process given patient was within treatment window and encephalopathy may have been limiting assessment for focal deficits  Further recommendations -Given MRI brain is negative, favor observation for improvement with mental status as she metabolizes sedating medication -Consider tapering sedating medications, further toxic/metabolic/infectious workup per ED/primary team -Consider adjunct of nonsedating approaches for pain control such as cognitive behavioral therapy -Inpatient neurology will sign off at this time, please reach out if patient's mental status is not improving or if other questions or concerns arise ______________________________________________________________________   Lola Jernigan MD-PhD [facility] [phone]   CRITICAL CARE Performed by: Lola LITTIE Jernigan  Total critical care time: 45 minutes  Critical care time was exclusive of separately billable procedures and treating other patients.  Critical care was necessary to  treat or prevent imminent or life-threatening deterioration --emergent evaluation for consideration of thrombolytic or thrombectomy  Critical care was time spent personally by me on the following activities: development of treatment plan with patient and/or surrogate as well as nursing, discussions with consultants, evaluation of patient's response to treatment, examination of patient, obtaining history from patient or surrogate, ordering and performing treatments and interventions, ordering and review of laboratory studies, ordering and review of radiographic studies, pulse oximetry and re-evaluation of patient's condition.

## 2023-12-08 NOTE — Progress Notes (Signed)
  Chaplain On-Call responded to Code Stroke notification at 1242 hours.  The patient was at the CT Scan area for tests.  Chaplain assured Staff of availability as needed.  Chaplain Dean Every., [facility]

## 2023-12-08 NOTE — Progress Notes (Signed)
 Elert 1244 - stroke cart in CT at this time and pt coming from triage   Dr. Cleone Dad paged 1245 Dr. Cleone Dad in CT 1246 Pt in CT 1247  Telestroke RN no longer needed per Dr. Cleone Dad 1257  LNW 0930 mRS 2

## 2023-12-08 NOTE — ED Triage Notes (Addendum)
 Patient reports falling 2 1/2 weeks ago. Prescribed oxy and muscle relaxers today. Husband reports patient has been having odd behavior/AMS since taking medication/he got home from getting it. Husband states she has been talking out of her head, having confusion when speaking to people, falling asleep standing up, spent 45 minutes making the bed.  Husband reports patient had normal mentation until today.  MRI was scheduled for this evening.   LKW 9:30am today

## 2023-12-08 NOTE — ED Provider Notes (Signed)
 [facility] Provider Note    Event Date/Time   First MD Initiated Contact with Patient 12/08/23 1505     (approximate)   History   Fall and Code Stroke   HPI  [name] is a 63 year old female presenting to the emergency department for evaluation of altered mental status.  Patient has been on pain medication and muscle relaxers for the past few weeks.  Reportedly has had some confusion during that time.  Today, patient was at her baseline mental status, received her morning medications.  When her husband went to check on her, where she was found to be confused with difficulty speaking.  In triage she had a right arm drift and code stroke was activated.   On my reevaluation after code stroke imaging, patient is awake, interactive.  Reports feeling some ongoing confusion, denies other complaints.    Physical Exam   Triage Vital Signs: ED Triage Vitals [12/08/23 1236]  Encounter Vitals Group     BP (!) 123/93     Systolic BP Percentile      Diastolic BP Percentile      Pulse Rate 100     Resp 18     Temp 98 F (36.7 C)     Temp Source Oral     SpO2 93 %     Weight      Height      Head Circumference      Peak Flow      Pain Score      Pain Loc      Pain Education      Exclude from Growth Chart     Most recent vital signs: Vitals:   12/08/23 1337 12/08/23 1400  BP: (!) 154/102 116/80  Pulse: 95 83  Resp: 17   Temp:    SpO2: 100% 100%     General: Awake, interactive  CV:  Regular rate, good peripheral perfusion.  Resp:  Unlabored respirations, lungs clear to auscultation Abd:  Nondistended, soft, nontender to palpation Neuro:  After imaging, keenly aware, correctly answers month and age, able to blink eyes and squeeze hands, normal horizontal extraocular movements, no visual field loss, normal facial symmetry, no arm or leg motor drift, no limb ataxia, normal sensation, no aphasia, no dysarthria, no inattention    ED Results /  Procedures / Treatments   Labs (all labs ordered are listed, but only abnormal results are displayed) Labs Reviewed  APTT - Abnormal; Notable for the following components:      Result Value   aPTT 22 (*)    All other components within normal limits  CBC - Abnormal; Notable for the following components:   WBC 10.7 (*)    Hemoglobin 15.1 (*)    All other components within normal limits  COMPREHENSIVE METABOLIC PANEL - Abnormal; Notable for the following components:   Sodium 134 (*)    CO2 18 (*)    Glucose, Bld 111 (*)    Creatinine, Ser 1.19 (*)    Total Protein 8.7 (*)    AST 54 (*)    GFR, Estimated 52 (*)    All other components within normal limits  PROTIME-INR  DIFFERENTIAL  ETHANOL  URINALYSIS, W/ REFLEX TO CULTURE (INFECTION SUSPECTED)  CBG MONITORING, ED     EKG EKG independently reviewed interpreted by myself (ER attending) demonstrates:    RADIOLOGY Imaging independently reviewed and interpreted by myself demonstrates:  CT head without acute bleed MRI brain without acute  stroke  PROCEDURES:  Critical Care performed: No  Procedures   MEDICATIONS ORDERED IN ED: Medications  sodium chloride  flush (NS) 0.9 % injection 3 mL (3 mLs Intravenous Not Given 12/08/23 1322)     IMPRESSION / MDM / ASSESSMENT AND PLAN / ED COURSE  I reviewed the triage vital signs and the nursing notes.  Differential diagnosis includes, but is not limited to, medication adverse effect, acute CVA, electrolyte abnormality, anemia, intracranial bleed  Patient's presentation is most consistent with acute presentation with potential threat to life or bodily function.  63 year old female presenting with altered mental status, activated as a code stroke in triage.  Vital stable on presentation.  Labs without critical derangement, though CMP is pending.  UA pending.  CT head and MRI brain without acute abnormalities.  Patient was evaluated by Dr. Jerrie with neurology.  She did recommend  observation in the ER, if patient's mental status improved to her baseline, did feel that she could be discharged from the ER if workup otherwise reassuring.  If patient does not improve, may require admission. Did recommend tapering any sedating medicines.  Does appear the patient is on 300 of gabapentin  3 times daily as well has oxycodone  20 mg.  Suspect that these could be down titrated.  Signed out to oncoming provider pending reassessment after completion of workup, evaluation after further metabolization to determine if patient can be safely discharged home.      FINAL CLINICAL IMPRESSION(S) / ED DIAGNOSES   Final diagnoses:  Acute encephalopathy     Rx / DC Orders   ED Discharge Orders     None        Note:  This document was prepared using Dragon voice recognition software and may include unintentional dictation errors.   Levander Slate, MD 12/08/23 [id number]

## 2023-12-08 NOTE — Progress Notes (Signed)
 CODE STROKE- PHARMACY COMMUNICATION   Time CODE STROKE called/page received: 1245  Time response to CODE STROKE was made (in person or via phone): Immediately, in person  Time Stroke Kit retrieved from [employer] (only if needed): No TNK per neurologist  Name of Provider/Nurse contacted: Dr. Jerrie  Past Medical History:  Diagnosis Date   Abdominal aortic atherosclerosis (HCC)    Anemia    Arthritis    bilateral knees   Bronchitis    Carpal tunnel syndrome, bilateral    Contact dermatitis and other eczema, due to unspecified cause    COPD (chronic obstructive pulmonary disease) (HCC)    DDD (degenerative disc disease)    Depressive disorder    Diverticulosis    Dysmetabolic syndrome X    Eczema    GERD (gastroesophageal reflux disease)    Hyperlipidemia    Hypertension    IBS (irritable bowel syndrome)    Insomnia    Lumbago    Nonspecific abnormal electrocardiogram (ECG) (EKG)    Osteoarthritis of both knees    Other ovarian failure(256.39)    Peptic ulcer    Postmenopausal atrophic vaginitis    Symptomatic menopausal or female climacteric states    Unspecified vitamin D  deficiency    Prior to Admission medications   Medication Sig Start Date End Date Taking? Authorizing Provider  amitriptyline  (ELAVIL ) 10 MG tablet Take 1 tablet (10 mg total) by mouth at bedtime. 10/19/23   Sowles, Krichna, MD  amLODipine  (NORVASC ) 5 MG tablet Take 1 tablet (5 mg total) by mouth daily. 08/12/23   Gareth Mliss FALCON, FNP  aspirin  EC 81 MG EC tablet Take 1 tablet (81 mg total) by mouth daily. Swallow whole. 02/21/22   Krishnan, Sendil K, MD  atorvastatin  (LIPITOR) 20 MG tablet Take 1 tablet (20 mg total) by mouth daily. 10/19/23   Sowles, Krichna, MD  cariprazine  (VRAYLAR ) 1.5 MG capsule Take 1 capsule (1.5 mg total) by mouth daily. 10/19/23   Sowles, Krichna, MD  carvedilol  (COREG ) 6.25 MG tablet TAKE 1 TABLET(6.25 MG) BY MOUTH TWICE DAILY 10/06/23   Darliss Rogue, MD  cholecalciferol   (VITAMIN D ) 1000 units tablet Take 1,000 Units by mouth daily.    [provider]  Cyanocobalamin  (B-12) 1000 MCG SUBL Place 1 each under the tongue 3 (three) times a week.    [provider]  desvenlafaxine  (PRISTIQ ) 50 MG 24 hr tablet Take 1 tablet (50 mg total) by mouth daily. 10/19/23   Sowles, Krichna, MD  gabapentin  (NEURONTIN ) 300 MG capsule Take 300 mg by mouth every 6 (six) hours as needed (Nerve pain).    Clide Boss, MD  losartan -hydrochlorothiazide  (HYZAAR) 100-25 MG tablet TAKE 1 TABLET BY MOUTH DAILY 11/08/23   Pender, Julie F, FNP  metFORMIN  (GLUCOPHAGE ) 500 MG tablet Take 1 tablet (500 mg total) by mouth daily with breakfast. 06/04/21   Sowles, Krichna, MD  Multiple Vitamins-Minerals (MULTIVITAMIN WITH MINERALS) tablet Take 1 tablet by mouth every other day.    [provider]  NARCAN 4 MG/0.1ML LIQD nasal spray kit Place 1 spray into the nose once. 03/14/19   Clide Boss, MD  ondansetron  (ZOFRAN -ODT) 4 MG disintegrating tablet Take 1 tablet (4 mg total) by mouth every 8 (eight) hours as needed for nausea or vomiting. 05/13/23   Arlander Charleston, MD  Oxycodone  HCl 20 MG TABS Take 20 mg by mouth every 4 (four) hours as needed (pain). 07/16/20   [facility]  pantoprazole  (PROTONIX ) 40 MG tablet Take 1 tablet (40  mg total) by mouth daily. 07/19/23   Gareth Mliss FALCON, FNP  spironolactone  (ALDACTONE ) 25 MG tablet TAKE 1/2 TABLET(12.5 MG) BY MOUTH DAILY 02/10/23   Furth, Cadence H, PA-C  tiZANidine  (ZANAFLEX ) 4 MG tablet Take 4 mg by mouth every 8 (eight) hours. 02/13/22   [provider]  traZODone  (DESYREL ) 50 MG tablet Take 0.5-1 tablets (25-50 mg total) by mouth at bedtime as needed for sleep. 10/19/23   Sowles, Krichna, MD  valACYclovir  (VALTREX ) 1000 MG tablet TAKE 1 TABLET(1000 MG) BY MOUTH TWICE DAILY AS NEEDED 07/19/23   Pender, Julie F, FNP    Elsie CHRISTELLA Piety ,PharmD Clinical Pharmacist  12/08/2023  12:45 PM

## 2023-12-08 NOTE — ED Provider Notes (Signed)
-----------------------------------------   3:05 PM on 12/08/2023 -----------------------------------------  Blood pressure 116/80, pulse 83, temperature 98 F (36.7 C), temperature source Oral, resp. rate 17, SpO2 100%.  Assuming care from Dr. Levander.  In short, [name] is a 63 y.o. female with a chief complaint of Fall and Code Stroke .  Refer to the original H&P for additional details.  The current plan of care is to follow-up UA and CMP results for AMS, suspected polypharmacy.  ----------------------------------------- 6:47 PM on 12/08/2023 ----------------------------------------- CMP with mild renal dysfunction, otherwise unremarkable.  Urinalysis with no signs of infection.  Patient seems to be back to her baseline, patient and husband requesting to be discharged home.  She was counseled to follow-up with her PCP and to return to the ED for new or worsening symptoms.  Patient agrees with plan.         Willo Dunnings, MD 12/08/23 [phone]

## 2023-12-08 NOTE — Code Documentation (Signed)
 Stroke Response Nurse Documentation Code Documentation  [name] is a 63 y.o. female arriving to [facility] via [employer] [facility] on 12/08/2023 with past medical hx of HTN, HLD, CPOD, DDD, GERD, former smoker. On aspirin  81 mg daily. Code stroke was activated by ED.   Patient from home where she was LKW at 0930 and now complaining of AMS. Pt was seen by husband in her normal state  before leaving house. Pt found with AMS at 1000 and was brought to hospital. Triage noted confusion, difficulty speaking, and right arm drift, code stroke activated.   Stroke team at the bedside after code stroke activated. Labs drawn and patient cleared for CT by PA Triplett. Patient to CT with team. NIHSS 2, see documentation for details and code stroke times. Patient with left decreased sensation and Expressive aphasia  on exam. The following imaging was completed:  CT Head and MRI. Patient is not a candidate for IV Thrombolytic due to not a stroke on MRI, per MD. Patient is not a candidate for IR due to exam not consistent with LVO, per MD.   Care Plan: Code stroke cancelled, not a stroke on MRI, per MD.   Bedside handoff with ED RN Powell JUDITHANN Burnard KANDICE Hershel  Stroke Response RN

## 2023-12-08 NOTE — ED Provider Triage Note (Signed)
 Emergency Medicine Provider Triage Evaluation Note  [name] , a 63 y.o. female  was evaluated in triage.  Husband reports that patient fell 2 1/2 weeks ago and hit her head. Today, around 9:30am, she started acting very confused and slurring and mixing up her words. She started making a bed for over that was already made, talking to people who weren't there. Change was sudden. She remains altered. Husband reports he had gone to the pharmacy to get some of her medications a couple of hours prior to symptoms and she was totally normal. When he got home she was normal as well.    Physical Exam  BP (!) 123/93 (BP Location: Right Arm)   Pulse 100   Temp 98 F (36.7 C) (Oral)   Resp 18   SpO2 93%  Gen:   Awake, no distress   Resp:  Normal effort  MSK:   Moves extremities without difficulty  Other:  Slow to follow commands, right upper extremity with drift noted,   Medical Decision Making  Medically screening exam initiated at 12:58 PM.  Appropriate orders placed.  [name] was informed that the remainder of the evaluation will be completed by another provider, this initial triage assessment does not replace that evaluation, and the importance of remaining in the ED until their evaluation is complete.  Code stroke activated    Olivia Kirk NOVAK, FNP 12/08/23 1311

## 2023-12-09 ENCOUNTER — Telehealth: Payer: Self-pay

## 2023-12-09 NOTE — Transitions of Care (Post Inpatient/ED Visit) (Signed)
 12/09/2023  Name: [name] MRN: [id number] DOB: 30-Jun-1961  Today's TOC FU Call Status: Today's TOC FU Call Status:: Successful TOC FU Call Completed TOC FU Call Complete Date: 12/09/23 Patient's Name and Date of Birth confirmed.  Transition Care Management Follow-up Telephone Call Date of Discharge: 12/08/23 Discharge Facility: [facility]) Type of Discharge: Emergency Department Reason for ED Visit: Other: (encephalopathy) How have you been since you were released from the hospital?: Better Any questions or concerns?: No  Items Reviewed: Did you receive and understand the discharge instructions provided?: Yes Medications obtained,verified, and reconciled?: Yes (Medications Reviewed) Any new allergies since your discharge?: No Dietary orders reviewed?: Yes Do you have support at home?: Yes People in Home: spouse  Medications Reviewed Today: Medications Reviewed Today     Reviewed by Emmitt Pan, LPN (Licensed Practical Nurse) on 12/09/23 at 1243  Med List Status: <None>   Medication Order Taking? Sig Documenting Provider Last Dose Status Informant  amitriptyline  (ELAVIL ) 10 MG tablet [id number]  Take 1 tablet (10 mg total) by mouth at bedtime. Sowles, Krichna, MD  Active   amLODipine  (NORVASC ) 5 MG tablet [id number] No Take 1 tablet (5 mg total) by mouth daily. Gareth Mliss FALCON, FNP Taking Active   aspirin  EC 81 MG EC tablet [id number] No Take 1 tablet (81 mg total) by mouth daily. Swallow whole. Krishnan, Sendil K, MD Taking Active   atorvastatin  (LIPITOR) 20 MG tablet [id number]  Take 1 tablet (20 mg total) by mouth daily. Sowles, Krichna, MD  Active   cariprazine  (VRAYLAR ) 1.5 MG capsule [id number]  Take 1 capsule (1.5 mg total) by mouth daily. Sowles, Krichna, MD  Active   carvedilol  (COREG ) 6.25 MG tablet [id number] No TAKE 1 TABLET(6.25 MG) BY MOUTH TWICE DAILY Agbor-Etang, Redell, MD Taking Active   cholecalciferol  (VITAMIN D ) 1000 units  tablet [id number] No Take 1,000 Units by mouth daily. [provider] Taking Active Self  Cyanocobalamin  (B-12) 1000 MCG SUBL [id number] No Place 1 each under the tongue 3 (three) times a week. [provider] Taking Active   desvenlafaxine  (PRISTIQ ) 50 MG 24 hr tablet [id number]  Take 1 tablet (50 mg total) by mouth daily. Sowles, Krichna, MD  Active   gabapentin  (NEURONTIN ) 300 MG capsule [id number] No Take 300 mg by mouth every 6 (six) hours as needed (Nerve pain). Clide Boss, MD Taking Active Self  losartan -hydrochlorothiazide  [facility]) 100-25 MG tablet [id number]  TAKE 1 TABLET BY MOUTH DAILY Pender, Julie F, FNP  Active   metFORMIN  (GLUCOPHAGE ) 500 MG tablet [id number] No Take 1 tablet (500 mg total) by mouth daily with breakfast. Sowles, Krichna, MD Taking Active Self           Med Note CHRISTIE ALYSON [place] Dec 24, 2021 10:13 AM)    Multiple Vitamins-Minerals (MULTIVITAMIN WITH MINERALS) tablet [id number] No Take 1 tablet by mouth every other day. [provider] Taking Active Self  NARCAN 4 MG/0.1ML LIQD nasal spray kit [id number] No Place 1 spray into the nose once. Clide Boss, MD Taking Active Self  ondansetron  (ZOFRAN -ODT) 4 MG disintegrating tablet [id number] No Take 1 tablet (4 mg total) by mouth every 8 (eight) hours as needed for nausea or vomiting. Arlander Charleston, MD Taking Active   Oxycodone  HCl 20 MG TABS [id number] No Take 20 mg by mouth every 4 (four) hours as needed (pain). [facility] Taking Active Self  pantoprazole  (PROTONIX ) 40 MG tablet [id number] No Take 1 tablet (40 mg total)  by mouth daily. Gareth Mliss FALCON, FNP Taking Active   spironolactone  (ALDACTONE ) 25 MG tablet [id number] No TAKE 1/2 TABLET(12.5 MG) BY MOUTH DAILY Furth, Cadence H, PA-C Taking Active   tiZANidine  (ZANAFLEX ) 4 MG tablet [id number] No Take 4 mg by mouth every 8 (eight) hours. [provider] Taking Active Self  traZODone  (DESYREL ) 50 MG  tablet [id number]  Take 0.5-1 tablets (25-50 mg total) by mouth at bedtime as needed for sleep. Sowles, Krichna, MD  Active   valACYclovir  (VALTREX ) 1000 MG tablet [id number] No TAKE 1 TABLET(1000 MG) BY MOUTH TWICE DAILY AS NEEDED Pender, Julie F, FNP Taking Active   Med List Note (Tapia, Leisa, PA-C 02/06/22 1356): sgent            Home Care and Equipment/Supplies: Were Home Health Services Ordered?: NA Any new equipment or medical supplies ordered?: NA  Functional Questionnaire: Do you need assistance with bathing/showering or dressing?: No Do you need assistance with meal preparation?: No Do you need assistance with eating?: No Do you have difficulty maintaining continence: No Do you need assistance with getting out of bed/getting out of a chair/moving?: No Do you have difficulty managing or taking your medications?: No  Follow up appointments reviewed: PCP Follow-up appointment confirmed?: No (declined) MD Provider Line [phone] Given: No Specialist Hospital Follow-up appointment confirmed?: NA Do you need transportation to your follow-up appointment?: No Do you understand care options if your condition(s) worsen?: Yes-patient verbalized understanding    SIGNATURE Julian Lemmings, LPN [facility] Nurse Health Advisor Direct Dial [phone]

## 2023-12-31 ENCOUNTER — Other Ambulatory Visit: Payer: Self-pay | Admitting: Nurse Practitioner

## 2023-12-31 DIAGNOSIS — F339 Major depressive disorder, recurrent, unspecified: Secondary | ICD-10-CM

## 2023-12-31 DIAGNOSIS — F5104 Psychophysiologic insomnia: Secondary | ICD-10-CM

## 2023-12-31 NOTE — Telephone Encounter (Signed)
 Requested medication (s) are due for refill today: No  Requested medication (s) are on the active medication list: No  Last refill:  07/19/23  Future visit scheduled: Yes  Notes to clinic:  Unable to refill per protocol, cannot delegate.      Requested Prescriptions  Pending Prescriptions Disp Refills   QUEtiapine (SEROQUEL) 25 MG tablet [Pharmacy Med Name: QUETIAPINE 25MG  TABLETS] 180 tablet 0    Sig: TAKE 1 TO 2 TABLETS(25 TO 50 MG) BY MOUTH AT BEDTIME     Not Delegated - Psychiatry:  Antipsychotics - Second Generation (Atypical) - quetiapine Failed - 12/31/2023  4:33 PM      Failed - This refill cannot be delegated      Failed - TSH in normal range and within 360 days    TSH  Date Value Ref Range Status  02/05/2022 0.90 0.40 - 4.50 mIU/L Final         Failed - Lipid Panel in normal range within the last 12 months    Cholesterol  Date Value Ref Range Status  10/19/2023 154 <200 mg/dL Final   LDL Cholesterol (Calc)  Date Value Ref Range Status  10/19/2023 69 mg/dL (calc) Final    Comment:    Reference range: <100 . Desirable range <100 mg/dL for primary prevention;   <70 mg/dL for patients with CHD or diabetic patients  with > or = 2 CHD risk factors. Marland Kitchen LDL-C is now calculated using the Martin-Hopkins  calculation, which is a validated novel method providing  better accuracy than the Friedewald equation in the  estimation of LDL-C.  Horald Pollen et al. Lenox Ahr. 1610;960(45): 2061-2068  (http://education.[employer].com/faq/FAQ164)    HDL  Date Value Ref Range Status  10/19/2023 47 (L) > OR = 50 mg/dL Final   Triglycerides  Date Value Ref Range Status  10/19/2023 318 (H) <150 mg/dL Final    Comment:    . If a non-fasting specimen was collected, consider repeat triglyceride testing on a fasting specimen if clinically indicated.  Perry Mount et al. J. of Clin. Lipidol. 2015;9:129-169. Marland Kitchen          Passed - Completed PHQ-2 or PHQ-9 in the last 360 days       Passed - Last BP in normal range    BP Readings from Last 1 Encounters:  12/08/23 111/68         Passed - Last Heart Rate in normal range    Pulse Readings from Last 1 Encounters:  12/08/23 82         Passed - Valid encounter within last 6 months    Recent Outpatient Visits           2 months ago Major depression, recurrent, chronic (HCC)   [facility] Alba Cory, MD   4 months ago Encounter for completion of form with patient   [facility] Berniece Salines, FNP   5 months ago Hypertension, benign   [facility] Berniece Salines, FNP   7 months ago B12 deficiency   [facility] Danelle Berry, PA-C   8 months ago Hypertension, benign   [facility] Danelle Berry, [place]       Future Appointments             In 2 weeks Alba Cory, MD [facility]            Passed - CBC within  normal limits and completed in the last 12 months    WBC  Date Value Ref Range Status  12/08/2023 10.7 (H) 4.0 - 10.5 K/uL Final   RBC  Date Value Ref Range Status  12/08/2023 4.77 3.87 - 5.11 MIL/uL Final   Hemoglobin  Date Value Ref Range Status  12/08/2023 15.1 (H) 12.0 - 15.0 g/dL Final   HCT  Date Value Ref Range Status  12/08/2023 44.8 36.0 - 46.0 % Final   MCHC  Date Value Ref Range Status  12/08/2023 33.7 30.0 - 36.0 g/dL Final   [facility]  Date Value Ref Range Status  12/08/2023 31.7 26.0 - 34.0 pg Final   MCV  Date Value Ref Range Status  12/08/2023 93.9 80.0 - 100.0 fL Final   No results found for: "PLTCOUNTKUC", "LABPLAT", "POCPLA" RDW  Date Value Ref Range Status  12/08/2023 12.4 11.5 - 15.5 % Final         Passed - CMP within normal limits and completed in the last 12 months    Albumin  Date Value Ref Range Status  12/08/2023 4.6 3.5 - 5.0 g/dL Final   Alkaline Phosphatase  Date Value Ref Range  Status  12/08/2023 103 38 - 126 U/L Final   Alkaline phosphatase (APISO)  Date Value Ref Range Status  10/19/2023 105 37 - 153 U/L Final   ALT  Date Value Ref Range Status  12/08/2023 31 0 - 44 U/L Final    Comment:    HEMOLYSIS AT THIS LEVEL MAY AFFECT RESULT   AST  Date Value Ref Range Status  12/08/2023 54 (H) 15 - 41 U/L Final    Comment:    HEMOLYSIS AT THIS LEVEL MAY AFFECT RESULT   BUN  Date Value Ref Range Status  12/08/2023 13 8 - 23 mg/dL Final   Calcium  Date Value Ref Range Status  12/08/2023 9.4 8.9 - 10.3 mg/dL Final   CO2  Date Value Ref Range Status  12/08/2023 18 (L) 22 - 32 mmol/L Final   Creat  Date Value Ref Range Status  10/19/2023 0.84 0.50 - 1.05 mg/dL Final   Creatinine, Ser  Date Value Ref Range Status  12/08/2023 1.19 (H) 0.44 - 1.00 mg/dL Final   Creatinine, Urine  Date Value Ref Range Status  10/19/2023 44 20 - 275 mg/dL Final   Glucose, Bld  Date Value Ref Range Status  12/08/2023 111 (H) 70 - 99 mg/dL Final    Comment:    Glucose reference range applies only to samples taken after fasting for at least 8 hours.   Glucose-Capillary  Date Value Ref Range Status  12/08/2023 84 70 - 99 mg/dL Final    Comment:    Glucose reference range applies only to samples taken after fasting for at least 8 hours.   Potassium  Date Value Ref Range Status  12/08/2023 4.5 3.5 - 5.1 mmol/L Final    Comment:    HEMOLYSIS AT THIS LEVEL MAY AFFECT RESULT   Sodium  Date Value Ref Range Status  12/08/2023 134 (L) 135 - 145 mmol/L Final   Total Bilirubin  Date Value Ref Range Status  12/08/2023 1.1 0.0 - 1.2 mg/dL Final    Comment:    HEMOLYSIS AT THIS LEVEL MAY AFFECT RESULT   Protein, ur  Date Value Ref Range Status  12/08/2023 NEGATIVE NEGATIVE mg/dL Final   Total Protein  Date Value Ref Range Status  12/08/2023 8.7 (H) 6.5 - 8.1 g/dL Final   GFR,  Est African American  Date Value Ref Range Status  08/07/2020 118 > OR = 60  mL/min/1.66m2 Final   eGFR  Date Value Ref Range Status  10/19/2023 80 > OR = 60 mL/min/1.3m2 Final   GFR, Est Non African American  Date Value Ref Range Status  08/07/2020 102 > OR = 60 mL/min/1.71m2 Final   GFR, Estimated  Date Value Ref Range Status  12/08/2023 52 (L) >60 mL/min Final    Comment:    (NOTE) Calculated using the CKD-EPI Creatinine Equation (2021)

## 2024-01-07 DIAGNOSIS — Z79891 Long term (current) use of opiate analgesic: Secondary | ICD-10-CM | POA: Diagnosis not present

## 2024-01-07 DIAGNOSIS — M170 Bilateral primary osteoarthritis of knee: Secondary | ICD-10-CM | POA: Diagnosis not present

## 2024-01-07 DIAGNOSIS — M25561 Pain in right knee: Secondary | ICD-10-CM | POA: Diagnosis not present

## 2024-01-07 DIAGNOSIS — G894 Chronic pain syndrome: Secondary | ICD-10-CM | POA: Diagnosis not present

## 2024-01-07 DIAGNOSIS — M5136 Other intervertebral disc degeneration, lumbar region with discogenic back pain only: Secondary | ICD-10-CM | POA: Diagnosis not present

## 2024-01-10 ENCOUNTER — Other Ambulatory Visit: Payer: Self-pay | Admitting: Family Medicine

## 2024-01-10 DIAGNOSIS — K219 Gastro-esophageal reflux disease without esophagitis: Secondary | ICD-10-CM

## 2024-01-17 ENCOUNTER — Ambulatory Visit: Payer: Medicare Other | Admitting: Family Medicine

## 2024-01-31 ENCOUNTER — Other Ambulatory Visit: Payer: Self-pay | Admitting: Nurse Practitioner

## 2024-01-31 ENCOUNTER — Other Ambulatory Visit: Payer: Self-pay | Admitting: Family Medicine

## 2024-01-31 DIAGNOSIS — F339 Major depressive disorder, recurrent, unspecified: Secondary | ICD-10-CM

## 2024-01-31 DIAGNOSIS — I10 Essential (primary) hypertension: Secondary | ICD-10-CM

## 2024-02-02 ENCOUNTER — Ambulatory Visit (INDEPENDENT_AMBULATORY_CARE_PROVIDER_SITE_OTHER): Admitting: Family Medicine

## 2024-02-02 ENCOUNTER — Encounter: Payer: Self-pay | Admitting: Family Medicine

## 2024-02-02 VITALS — BP 126/82 | HR 100 | Resp 16 | Ht 68.0 in | Wt 206.0 lb

## 2024-02-02 DIAGNOSIS — F339 Major depressive disorder, recurrent, unspecified: Secondary | ICD-10-CM | POA: Diagnosis not present

## 2024-02-02 DIAGNOSIS — K219 Gastro-esophageal reflux disease without esophagitis: Secondary | ICD-10-CM | POA: Diagnosis not present

## 2024-02-02 DIAGNOSIS — I10 Essential (primary) hypertension: Secondary | ICD-10-CM

## 2024-02-02 DIAGNOSIS — B001 Herpesviral vesicular dermatitis: Secondary | ICD-10-CM

## 2024-02-02 DIAGNOSIS — N1831 Chronic kidney disease, stage 3a: Secondary | ICD-10-CM

## 2024-02-02 DIAGNOSIS — I272 Pulmonary hypertension, unspecified: Secondary | ICD-10-CM | POA: Diagnosis not present

## 2024-02-02 DIAGNOSIS — I5032 Chronic diastolic (congestive) heart failure: Secondary | ICD-10-CM | POA: Diagnosis not present

## 2024-02-02 DIAGNOSIS — J449 Chronic obstructive pulmonary disease, unspecified: Secondary | ICD-10-CM

## 2024-02-02 DIAGNOSIS — F5104 Psychophysiologic insomnia: Secondary | ICD-10-CM | POA: Diagnosis not present

## 2024-02-02 DIAGNOSIS — G894 Chronic pain syndrome: Secondary | ICD-10-CM | POA: Diagnosis not present

## 2024-02-02 DIAGNOSIS — I700 Atherosclerosis of aorta: Secondary | ICD-10-CM

## 2024-02-02 MED ORDER — DESVENLAFAXINE SUCCINATE ER 50 MG PO TB24: 50.0000 mg | ORAL_TABLET | Freq: Every day | ORAL | 0 refills | Status: DC

## 2024-02-02 MED ORDER — PANTOPRAZOLE SODIUM 40 MG PO TBEC: 40.0000 mg | DELAYED_RELEASE_TABLET | Freq: Every day | ORAL | 1 refills | Status: DC

## 2024-02-02 MED ORDER — QUETIAPINE FUMARATE 25 MG PO TABS: 25.0000 mg | ORAL_TABLET | Freq: Every day | ORAL | 0 refills | Status: DC

## 2024-02-02 MED ORDER — ATORVASTATIN CALCIUM 20 MG PO TABS: 20.0000 mg | ORAL_TABLET | Freq: Every day | ORAL | 1 refills | Status: DC

## 2024-02-02 MED ORDER — LOSARTAN POTASSIUM-HCTZ 100-25 MG PO TABS
1.0000 | ORAL_TABLET | Freq: Every day | ORAL | 0 refills | Status: DC
Start: 2024-02-02 — End: 2024-08-02

## 2024-02-02 MED ORDER — VALACYCLOVIR HCL 1 G PO TABS: ORAL_TABLET | ORAL | 1 refills | Status: DC

## 2024-02-02 NOTE — Telephone Encounter (Signed)
 Requested Prescriptions  Pending Prescriptions Disp Refills   losartan-hydrochlorothiazide (HYZAAR) 100-25 MG tablet [Pharmacy Med Name: LOSARTAN/HCTZ 100/25MG  TABLETS] 90 tablet 0    Sig: TAKE 1 TABLET BY MOUTH DAILY     There is no refill protocol information for this order

## 2024-02-02 NOTE — Progress Notes (Signed)
 Name: [name]   MRN: [id number]    DOB: 03-07-1961   Date:02/02/2024       Progress Note  Subjective  Chief Complaint  Chief Complaint  Patient presents with   Medical Management of Chronic Issues   HPI   Metabolic syndrome:  She denies polyphagia, polydipsia or polyuria.  Continue life style modification, she is taking Metformin occasionally only . Last hgbA1C was 6.5 % but normal fasting glucose, explained if next value is also above 6.5 % she will have the diagnosis of DM, she states she has Metformin at home but has not been taking it lately but will resume it now   History of Encephalopathy: reviewed records from [facility]   HTN: bp today is at goal, she denies chest pain or palpitation Taking spironolactone , Norvasc, losartan hydrochlorothiazide ,Carvedilol and bp is at goal    Depression Major: long history of depression, got worse when mother died in 02-08-2017. She came in 12-09-2024feeling worse. Lack of energy and motivation. We continued Pristiq, she has not been taking Vraylar ( but not filled-likely due to cost ) she was given Trazodone for sleep but daughter states she has been sleeping all day and staying up at night. Discussed sleep hygiene  We placed referral to psychiatrist but no appointment scheduled. She will contact them directly She asked to go back on seroquel for sleep while waiting to see psychiatrist   COPD/Emphysema: She quit smoking 04/2019   She denies any wheezing , she has a dry cough but SOB is mild now. . She is not taking any medication. Last CT lung done 04/2023    IBS:she still has intermittent diarrhea and constipation. She states symptoms controlled with medication - Elavil    GERD: going to see Dr. Allegra Lai for EGD, she still  has intermittent pain on epigastric area and asked for refill of pantoprazole , I  reached out to Dr .Allegra Lai, she recommended to go down to 40 mg once a day , controlled with  medications but unable to skip doses  Atherosclerosis of  aorta: she has been taking Atorvastatin and aspirin ,stable    CHF diastolic and pulmonary hypertension: found on Echo done 08-Feb-2022 , still under the care of cardiologist  She has orthopnea and uses two pillows and stable, mild SOB with activity but stable, she has intermittent lower extremity edema. On ARB and spironolactone .    History of TIA: she [facility] to [facility] on [facility]/18/2022 due to left facial droop and fatigue. Evaluation was negative, found to have anemia , had negative holter monitor Unchanged    B12 deficiency: discussed SL form to take at least 3 times a week.   Chronic pain/DDD lumbar spine: going to the pain clinic [facility] in [place] and takes medications as prescribed   Fever blisters: takes Valtrex but did not fill her rx.  Patient Active Problem List   Diagnosis Date Noted   Chronic pain of both knees 08/12/2023   History of TIA (transient ischemic attack) 07/19/2023   B12 deficiency 07/19/2023   Chronic GERD    Gastric erosion    Pulmonary hypertension (HCC) 03/19/2022   Diastolic heart failure (HCC) 03/19/2022   Other abnormalities of gait and mobility 03/02/2022   Obesity (BMI 30-39.9) 02/19/2022   Prediabetes 02/19/2022   Sinus bradycardia 02/19/2022   TIA (transient ischemic attack) 02/18/2022   Chronic pain 02/18/2022   Prolonged QT syndrome 02-08-22   Status post left breast lumpectomy 01/23/2021  History of total knee arthroplasty, right 04/17/2020   S/P TKR (total knee replacement) using cement, left 04/12/2019   Benign neoplasm of ascending colon    Diverticulosis of large intestine without diverticulitis    Pancreatic lesion 04/21/2018   Abdominal aortic atherosclerosis (HCC) 04/21/2018   Acute peptic ulcer of stomach    Trigger thumb of right hand 02/25/2018   Carpal tunnel syndrome on both sides 10/21/2017   Elevated C-reactive protein 07/28/2017   Osteoarthritis of both knees 02/19/2016   COPD, mild (HCC) 05/08/2015   Vitamin D deficiency  05/07/2015   Metabolic syndrome 05/07/2015   Eczema 05/07/2015   Chronic insomnia 05/07/2015   Hypertension, benign 05/07/2015   Dyslipidemia 05/07/2015   Hyperglycemia 05/07/2015   Chronic low back pain 05/07/2015   Irritable bowel syndrome with both constipation and diarrhea 05/07/2015   Gastroesophageal reflux disease without esophagitis 05/07/2015   Major depression, recurrent, chronic (HCC) 05/07/2015   Menopausal syndrome (hot flashes) 05/07/2015   History of postmenopausal bleeding 01/14/2015    Past Surgical History:  Procedure Laterality Date   ACHILLES TENDON SURGERY Left 01/02/2022   Procedure: ACHILLES TENDON REPAIR-SECONDARY;  Surgeon: Gwyneth Revels, DPM;  Location: [facility];  Service: Podiatry;  Laterality: Left;   BACK SURGERY     BREAST EXCISIONAL BIOPSY Left ?   neg   BREAST LUMPECTOMY Left 01/08/2021   Procedure: BREAST LUMPECTOMY;  Surgeon: Campbell Lerner, MD;  Location: [facility];  Service: General;  Laterality: Left;   CARPAL TUNNEL RELEASE Left 08/15/2018   Procedure: CARPAL TUNNEL RELEASE;  Surgeon: Deeann Saint, MD;  Location: [facility];  Service: Orthopedics;  Laterality: Left;   CERVICAL DISCECTOMY     x 2; metal plate   CHOLECYSTECTOMY     COLONOSCOPY WITH PROPOFOL N/A 08/08/2018   Procedure: COLONOSCOPY WITH PROPOFOL;  Surgeon: Pasty Spillers, MD;  Location: [facility];  Service: Endoscopy;  Laterality: N/A;   COLONOSCOPY WITH PROPOFOL N/A 12/01/2021   Procedure: COLONOSCOPY WITH PROPOFOL;  Surgeon: Toney Reil, MD;  Location: [facility] ENDOSCOPY;  Service: Gastroenterology;  Laterality: N/A;   DILATATION & CURETTAGE/HYSTEROSCOPY WITH MYOSURE N/A 03/21/2015   Procedure: DILATATION & CURETTAGE/HYSTEROSCOPY;  Surgeon: Independence Bing, MD;  Location: [facility];  Service: Gynecology;  Laterality: N/A;   DILATION AND CURETTAGE OF UTERUS     ENDOMETRIAL ABLATION     ESOPHAGOGASTRODUODENOSCOPY (EGD) WITH PROPOFOL N/A 04/13/2018   Procedure:  ESOPHAGOGASTRODUODENOSCOPY (EGD) WITH PROPOFOL;  Surgeon: Pasty Spillers, MD;  Location: [facility];  Service: Endoscopy;  Laterality: N/A;   ESOPHAGOGASTRODUODENOSCOPY (EGD) WITH PROPOFOL N/A 08/08/2018   Procedure: ESOPHAGOGASTRODUODENOSCOPY (EGD) WITH PROPOFOL;  Surgeon: Pasty Spillers, MD;  Location: [facility];  Service: Endoscopy;  Laterality: N/A;   ESOPHAGOGASTRODUODENOSCOPY (EGD) WITH PROPOFOL N/A 03/25/2022   Procedure: ESOPHAGOGASTRODUODENOSCOPY (EGD) WITH PROPOFOL;  Surgeon: Toney Reil, MD;  Location: [facility] ENDOSCOPY;  Service: Gastroenterology;  Laterality: N/A;   ESOPHAGOGASTRODUODENOSCOPY (EGD) WITH PROPOFOL N/A 03/27/2022   Procedure: ESOPHAGOGASTRODUODENOSCOPY (EGD) WITH PROPOFOL;  Surgeon: Toney Reil, MD;  Location[facility];  Service: Gastroenterology;  Laterality: N/A;   JOINT REPLACEMENT     OSTECTOMY Left 01/02/2022   Procedure: CALCANEAL EXOSTECTOMY;  Surgeon: Gwyneth Revels, DPM;  Location: [facility];  Service: Podiatry;  Laterality: Left;   SPINAL FUSION     lumbar x2   TONSILLECTOMY     TOTAL KNEE ARTHROPLASTY Left 04/12/2019   Procedure: TOTAL KNEE ARTHROPLASTY;  Surgeon: Lyndle Herrlich, MD;  Location: [facility];  Service: Orthopedics;  Laterality:  Left;   TOTAL KNEE ARTHROPLASTY Right 04/17/2020   Procedure: TOTAL KNEE ARTHROPLASTY;  Surgeon: Lyndle Herrlich, MD;  Location: [facility];  Service: Orthopedics;  Laterality: Right;   TUBAL LIGATION      Family History  Problem Relation Age of Onset   Heart disease Mother    Thyroid cancer Mother    Congestive Heart Failure Mother    Hypertension Father    Heart failure Maternal Uncle    Diabetes Maternal Uncle    Kidney disease Maternal Uncle    Breast cancer Maternal Grandmother    Heart failure Maternal Grandfather    Depression Daughter    Asthma Daughter     Social History   Tobacco Use   Smoking status: Former    Current packs/day: 0.00    Average packs/day: 1  pack/day for 20.0 years (20.0 ttl pk-yrs)    Types: Cigarettes    Start date: 07/26/2002    Quit date: 04/06/2019    Years since quitting: 4.8   Smokeless tobacco: Former    Quit date: 04/05/2022   Tobacco comments:    Quit Date 04/05/2022  Substance Use Topics   Alcohol use: Yes    Comment: occasional beer     Current Outpatient Medications:    amitriptyline (ELAVIL) 10 MG tablet, Take 1 tablet (10 mg total) by mouth at bedtime., Disp: 90 tablet, Rfl: 0   amLODipine (NORVASC) 5 MG tablet, Take 1 tablet (5 mg total) by mouth daily., Disp: 90 tablet, Rfl: 3   aspirin EC 81 MG EC tablet, Take 1 tablet (81 mg total) by mouth daily. Swallow whole., Disp: 30 tablet, Rfl: 11   atorvastatin (LIPITOR) 20 MG tablet, Take 1 tablet (20 mg total) by mouth daily., Disp: 90 tablet, Rfl: 0   cariprazine (VRAYLAR) 1.5 MG capsule, Take 1 capsule (1.5 mg total) by mouth daily., Disp: 90 capsule, Rfl: 0   carvedilol (COREG) 6.25 MG tablet, TAKE 1 TABLET(6.25 MG) BY MOUTH TWICE DAILY, Disp: 180 tablet, Rfl: 2   cholecalciferol (VITAMIN D) 1000 units tablet, Take 1,000 Units by mouth daily., Disp: , Rfl:    Cyanocobalamin (B-12) 1000 MCG SUBL, Place 1 each under the tongue 3 (three) times a week., Disp: , Rfl:    desvenlafaxine (PRISTIQ) 50 MG 24 hr tablet, TAKE 1 TABLET(50 MG) BY MOUTH DAILY, Disp: 30 tablet, Rfl: 0   gabapentin (NEURONTIN) 300 MG capsule, Take 300 mg by mouth every 6 (six) hours as needed (Nerve pain)., Disp: , Rfl:    losartan-hydrochlorothiazide (HYZAAR) 100-25 MG tablet, TAKE 1 TABLET BY MOUTH DAILY, Disp: 90 tablet, Rfl: 0   metFORMIN (GLUCOPHAGE) 500 MG tablet, Take 1 tablet (500 mg total) by mouth daily with breakfast., Disp: 90 tablet, Rfl: 1   Multiple Vitamins-Minerals (MULTIVITAMIN WITH MINERALS) tablet, Take 1 tablet by mouth every other day., Disp: , Rfl:    NARCAN 4 MG/0.1ML LIQD nasal spray kit, Place 1 spray into the nose once., Disp: , Rfl:    ondansetron (ZOFRAN-ODT) 4 MG  disintegrating tablet, Take 1 tablet (4 mg total) by mouth every 8 (eight) hours as needed for nausea or vomiting., Disp: 20 tablet, Rfl: 0   Oxycodone HCl 20 MG TABS, Take 20 mg by mouth every 4 (four) hours as needed (pain)., Disp: , Rfl:    pantoprazole (PROTONIX) 40 MG tablet, TAKE 1 TABLET(40 MG) BY MOUTH DAILY, Disp: 30 tablet, Rfl: 1   spironolactone (ALDACTONE) 25 MG tablet, TAKE 1/2 TABLET(12.5 MG) BY MOUTH DAILY,  Disp: 45 tablet, Rfl: 1   tiZANidine (ZANAFLEX) 4 MG tablet, Take 4 mg by mouth every 8 (eight) hours., Disp: , Rfl:    traZODone (DESYREL) 50 MG tablet, Take 0.5-1 tablets (25-50 mg total) by mouth at bedtime as needed for sleep., Disp: 90 tablet, Rfl: 0   valACYclovir (VALTREX) 1000 MG tablet, TAKE 1 TABLET(1000 MG) BY MOUTH TWICE DAILY AS NEEDED, Disp: 10 tablet, Rfl: 1  No Known Allergies  I personally reviewed active problem list, medication list, allergies, family history with the patient/caregiver today.   ROS  Ten systems reviewed and is negative except as mentioned in HPI    Objective Physical Exam Constitutional: Patient appears well-developed and well-nourished. Obese  No distress.  HEENT: head atraumatic, normocephalic, neck supple Cardiovascular: Normal rate, regular rhythm and normal heart sounds.  No murmur heard. No BLE edema. Pulmonary/Chest: Effort normal and breath sounds normal. No respiratory distress. Abdominal: Soft.  There is no tenderness. Psychiatric: Patient has a normal mood and affect. behavior is normal. Judgment and thought content normal.   Vitals:   02/02/24 1434 02/02/24 1435  BP: 126/82   Pulse: (!) 105 100  Resp: 16   SpO2: 97%   Weight: 206 lb (93.4 kg)   Height: 5\' 8"  (1.727 m)     Body mass index is 31.32 kg/m.  Recent Results (from the past 2160 hours)  CBG monitoring, ED     Status: None   Collection Time: 12/08/23 12:38 PM  Result Value Ref Range   Glucose-Capillary 84 70 - 99 mg/dL    Comment: Glucose reference  range applies only to samples taken after fasting for at least 8 hours.  Protime-INR     Status: None   Collection Time: 12/08/23 12:41 PM  Result Value Ref Range   Prothrombin Time 13.7 11.4 - 15.2 seconds   INR 1.0 0.8 - 1.2    Comment: (NOTE) INR goal varies based on device and disease states. Performed at [facility], [address] [place], [place] [address]   APTT     Status: Abnormal   Collection Time: 12/08/23 12:41 PM  Result Value Ref Range   aPTT 22 (L) 24 - 36 seconds    Comment: Performed at [facility], [address] [place], [place] [address]  CBC     Status: Abnormal   Collection Time: 12/08/23 12:41 PM  Result Value Ref Range   WBC 10.7 (H) 4.0 - 10.5 K/uL   RBC 4.77 3.87 - 5.11 MIL/uL   Hemoglobin 15.1 (H) 12.0 - 15.0 g/dL   HCT 57.8 46.9 - 62.9 %   MCV 93.9 80.0 - 100.0 fL   MCH 31.7 26.0 - 34.0 pg   MCHC 33.7 30.0 - 36.0 g/dL   RDW 52.8 41.3 - 24.4 %   Platelets 379 150 - 400 K/uL   nRBC 0.0 0.0 - 0.2 %    Comment: Performed at [facility], [address] [place], [place] [address]  Differential     Status: None   Collection Time: 12/08/23 12:41 PM  Result Value Ref Range   Neutrophils Relative % 58 %   Neutro Abs 6.2 1.7 - 7.7 K/uL   Lymphocytes Relative 34 %   Lymphs Abs 3.6 0.7 - 4.0 K/uL   Monocytes Relative 6 %   Monocytes Absolute 0.7 0.1 - 1.0 K/uL   Eosinophils Relative 1 %   Eosinophils Absolute 0.1 0.0 - 0.5 K/uL   Basophils Relative 1 %  Basophils Absolute 0.1 0.0 - 0.1 K/uL   Immature Granulocytes 0 %   Abs Immature Granulocytes 0.03 0.00 - 0.07 K/uL    Comment: Performed at [facility], [address] [place], [place] [address]  Ethanol     Status: None   Collection Time: 12/08/23 12:41 PM  Result Value Ref Range   Alcohol, Ethyl (B) <10 <10 mg/dL    Comment: (NOTE) Lowest detectable limit for serum alcohol is 10 mg/dL.  For medical purposes only. Performed at [facility], [address] [place], [place] [address]   Comprehensive metabolic panel     Status: Abnormal   Collection Time: 12/08/23  2:26 PM  Result Value Ref Range   Sodium 134 (L) 135 - 145 mmol/L   Potassium 4.5 3.5 - 5.1 mmol/L    Comment: HEMOLYSIS AT THIS LEVEL MAY AFFECT RESULT   Chloride 103 98 - 111 mmol/L   CO2 18 (L) 22 - 32 mmol/L   Glucose, Bld 111 (H) 70 - 99 mg/dL    Comment: Glucose reference range applies only to samples taken after fasting for at least 8 hours.   BUN 13 8 - 23 mg/dL   Creatinine, Ser 0.98 (H) 0.44 - 1.00 mg/dL   Calcium 9.4 8.9 - 11.9 mg/dL   Total Protein 8.7 (H) 6.5 - 8.1 g/dL   Albumin 4.6 3.5 - 5.0 g/dL   AST 54 (H) 15 - 41 U/L    Comment: HEMOLYSIS AT THIS LEVEL MAY AFFECT RESULT   ALT 31 0 - 44 U/L    Comment: HEMOLYSIS AT THIS LEVEL MAY AFFECT RESULT   Alkaline Phosphatase 103 38 - 126 U/L   Total Bilirubin 1.1 0.0 - 1.2 mg/dL    Comment: HEMOLYSIS AT THIS LEVEL MAY AFFECT RESULT   GFR, Estimated 52 (L) >60 mL/min    Comment: (NOTE) Calculated using the CKD-EPI Creatinine Equation (2021)    Anion gap 13 5 - 15    Comment: Performed at [facility], [address] [place], [place] [address]  Urinalysis, w/ Reflex to Culture (Infection Suspected) -Urine, Clean Catch     Status: Abnormal   Collection Time: 12/08/23  5:21 PM  Result Value Ref Range   Specimen Source URINE, CLEAN CATCH    Color, Urine YELLOW (A) YELLOW   APPearance CLOUDY (A) CLEAR   Specific Gravity, Urine 1.013 1.005 - 1.030   pH 5.0 5.0 - 8.0   Glucose, UA NEGATIVE NEGATIVE mg/dL   Hgb urine dipstick NEGATIVE NEGATIVE   Bilirubin Urine NEGATIVE NEGATIVE   Ketones, ur NEGATIVE NEGATIVE mg/dL   Protein, ur NEGATIVE NEGATIVE mg/dL   Nitrite NEGATIVE NEGATIVE   Leukocytes,Ua TRACE (A) NEGATIVE   RBC / HPF 0-5 0 - 5 RBC/hpf   WBC, UA 6-10 0 - 5 WBC/hpf    Comment:        Reflex urine culture not performed if WBC <=10, OR if Squamous epithelial cells >5. If  Squamous epithelial cells >5 suggest recollection.    Bacteria, UA RARE (A) NONE SEEN   Squamous Epithelial / HPF 6-10 0 - 5 /HPF   Mucus PRESENT    Hyaline Casts, UA PRESENT     Comment: Performed at [facility], [address] [place], [place] [address]    Diabetic Foot Exam:     PHQ2/9:    02/02/2024    2:33 PM 10/19/2023    2:52 PM 08/12/2023    3:08 PM 07/19/2023  1:42 PM 05/12/2023    2:35 PM  Depression screen PHQ 2/9  Decreased Interest 1 2 0 0 0  Down, Depressed, Hopeless 1 2 0 1 1  PHQ - 2 Score 2 4 0 1 1  Altered sleeping 1 0  2 1  Tired, decreased energy 1 2  0 1  Change in appetite 0 0  0 1  Feeling bad or failure about yourself  0 1  0 0  Trouble concentrating 0 0  0 0  Moving slowly or fidgety/restless 0 0  0 0  Suicidal thoughts 0 0  0 0  PHQ-9 Score 4 7  3 4   Difficult doing work/chores Somewhat difficult Somewhat difficult  Not difficult at all Somewhat difficult    phq 9 is positive  Fall Risk:    10/19/2023    2:51 PM 08/12/2023    3:08 PM 07/19/2023    1:42 PM 05/12/2023    2:35 PM 11/09/2022    1:12 PM  Fall Risk   Falls in the past year? 1 1 1 1  0  Number falls in past yr: 0 1 0 0 0  Injury with Fall? 0 1 1 1  0  Risk for fall due to : Impaired balance/gait No Fall Risks;Impaired mobility;Impaired balance/gait;History of fall(s) No Fall Risks Impaired balance/gait No Fall Risks  Follow up Falls prevention discussed;Education provided;Falls evaluation completed Falls prevention discussed  Falls prevention discussed;Education provided;Falls evaluation completed Falls prevention discussed;Education provided;Falls evaluation completed     Assessment & Plan  1. Chronic kidney disease, stage 3a (HCC) (Primary)  On ARB, GFR stable  2. Abdominal aortic atherosclerosis (HCC)  - atorvastatin (LIPITOR) 20 MG tablet; Take 1 tablet (20 mg total) by mouth daily.  Dispense: 90 tablet; Refill: 1  3. Chronic diastolic heart failure  (HCC)  On medical management  4. COPD, mild (HCC)  Stopped smoking doing better  5. Pulmonary hypertension [facility])  Sees cardiologist   6. Chronic pain syndrome  Under the care of Pain clinic   7. Gastroesophageal reflux disease without esophagitis  - pantoprazole (PROTONIX) 40 MG tablet; Take 1 tablet (40 mg total) by mouth daily.  Dispense: 90 tablet; Refill: 1  8. Hypertension, benign  - losartan-hydrochlorothiazide (HYZAAR) 100-25 MG tablet; Take 1 tablet by mouth daily.  Dispense: 90 tablet; Refill: 0  9. Chronic insomnia  - QUEtiapine (SEROQUEL) 25 MG tablet; Take 1 tablet (25 mg total) by mouth at bedtime.  Dispense: 90 tablet; Refill: 0  10. Major depression, recurrent, chronic (HCC)  - desvenlafaxine (PRISTIQ) 50 MG 24 hr tablet; Take 1 tablet (50 mg total) by mouth daily.  Dispense: 90 tablet; Refill: 0 - QUEtiapine (SEROQUEL) 25 MG tablet; Take 1 tablet (25 mg total) by mouth at bedtime.  Dispense: 90 tablet; Refill: 0  11. Fever blister  - valACYclovir (VALTREX) 1000 MG tablet; TAKE 1 TABLET(1000 MG) BY MOUTH TWICE DAILY AS NEEDED  Dispense: 10 tablet; Refill: 1

## 2024-02-08 DIAGNOSIS — M545 Low back pain, unspecified: Secondary | ICD-10-CM | POA: Diagnosis not present

## 2024-02-08 DIAGNOSIS — G894 Chronic pain syndrome: Secondary | ICD-10-CM | POA: Diagnosis not present

## 2024-02-08 DIAGNOSIS — M79661 Pain in right lower leg: Secondary | ICD-10-CM | POA: Diagnosis not present

## 2024-02-08 DIAGNOSIS — Z79891 Long term (current) use of opiate analgesic: Secondary | ICD-10-CM | POA: Diagnosis not present

## 2024-02-08 DIAGNOSIS — M79662 Pain in left lower leg: Secondary | ICD-10-CM | POA: Diagnosis not present

## 2024-02-24 DIAGNOSIS — M545 Low back pain, unspecified: Secondary | ICD-10-CM | POA: Diagnosis not present

## 2024-02-24 DIAGNOSIS — M25552 Pain in left hip: Secondary | ICD-10-CM | POA: Diagnosis not present

## 2024-02-24 DIAGNOSIS — M25511 Pain in right shoulder: Secondary | ICD-10-CM | POA: Diagnosis not present

## 2024-02-24 DIAGNOSIS — G894 Chronic pain syndrome: Secondary | ICD-10-CM | POA: Diagnosis not present

## 2024-02-24 DIAGNOSIS — Z79891 Long term (current) use of opiate analgesic: Secondary | ICD-10-CM | POA: Diagnosis not present

## 2024-03-07 ENCOUNTER — Other Ambulatory Visit: Payer: Self-pay | Admitting: Family Medicine

## 2024-03-07 DIAGNOSIS — F339 Major depressive disorder, recurrent, unspecified: Secondary | ICD-10-CM

## 2024-03-07 NOTE — Telephone Encounter (Unsigned)
 Copied from CRM [id number]. Topic: Clinical - Medication Refill >> Mar 07, 2024 12:03 PM Rosaria Common wrote: Most Recent Primary Care Visit:  Provider: Arleen Lacer  Department: [facility]  Visit Type: OFFICE VISIT  Date: 02/02/2024  Medication: desvenlafaxine  (PRISTIQ ) 50 MG 24 hr tablet  Has the patient contacted their pharmacy? Yes (Agent: If no, request that the patient contact the pharmacy for the refill. If patient does not wish to contact the pharmacy document the reason why and proceed with request.) (Agent: If yes, when and what did the pharmacy advise?)  Is this the correct pharmacy for this prescription? Yes If no, delete pharmacy and type the correct one.  This is the patient's preferred pharmacy:  [id number] - [place], [place] - [address] [place] [address] Phone: [phone] Fax: [phone]   Has the prescription been filled recently? No  Is the patient out of the medication? Yes  Has the patient been seen for an appointment in the last year OR does the patient have an upcoming appointment? Yes  Can we respond through MyChart? No.   Agent: Please be advised that Rx refills may take up to 3 business days. We ask that you follow-up with your pharmacy.

## 2024-03-09 DIAGNOSIS — M545 Low back pain, unspecified: Secondary | ICD-10-CM | POA: Diagnosis not present

## 2024-03-09 DIAGNOSIS — G894 Chronic pain syndrome: Secondary | ICD-10-CM | POA: Diagnosis not present

## 2024-03-09 DIAGNOSIS — M25511 Pain in right shoulder: Secondary | ICD-10-CM | POA: Diagnosis not present

## 2024-03-09 DIAGNOSIS — Z79891 Long term (current) use of opiate analgesic: Secondary | ICD-10-CM | POA: Diagnosis not present

## 2024-03-09 MED ORDER — DESVENLAFAXINE SUCCINATE ER 50 MG PO TB24: 50.0000 mg | ORAL_TABLET | Freq: Every day | ORAL | 0 refills | Status: DC

## 2024-03-09 NOTE — Telephone Encounter (Signed)
 Patient requesting refill to different pharmacy than Rx sent on 02/02/24. [facility] pharmacy staff reports Rx not dispensed on 02/02/24. Last OV 02/02/24.  Requested Prescriptions  Pending Prescriptions Disp Refills   desvenlafaxine  (PRISTIQ ) 50 MG 24 hr tablet 90 tablet 0    Sig: Take 1 tablet (50 mg total) by mouth daily.     Psychiatry: Antidepressants - SNRI - desvenlafaxine  & venlafaxine  Failed - 03/09/2024  1:21 PM      Failed - Cr in normal range and within 360 days    Creat  Date Value Ref Range Status  10/19/2023 0.84 0.50 - 1.05 mg/dL Final   Creatinine, Ser  Date Value Ref Range Status  12/08/2023 1.19 (H) 0.44 - 1.00 mg/dL Final   Creatinine, Urine  Date Value Ref Range Status  10/19/2023 44 20 - 275 mg/dL Final         Failed - Valid encounter within last 6 months    Recent Outpatient Visits           1 month ago Chronic kidney disease, stage 3a [facility])   [facility] [place], Krichna, MD              Failed - Lipid Panel in normal range within the last 12 months    Cholesterol  Date Value Ref Range Status  10/19/2023 154 <200 mg/dL Final   LDL Cholesterol (Calc)  Date Value Ref Range Status  10/19/2023 69 mg/dL (calc) Final    Comment:    Reference range: <100 . Desirable range <100 mg/dL for primary prevention;   <70 mg/dL for patients with CHD or diabetic patients  with > or = 2 CHD risk factors. Aaron Aas LDL-C is now calculated using the Martin-Hopkins  calculation, which is a validated novel method providing  better accuracy than the Friedewald equation in the  estimation of LDL-C.  Melinda Sprawls et al. Erroll Heard. 3875;643(32): 2061-2068  (http://education.[employer].com/faq/FAQ164)    HDL  Date Value Ref Range Status  10/19/2023 47 (L) > OR = 50 mg/dL Final   Triglycerides  Date Value Ref Range Status  10/19/2023 318 (H) <150 mg/dL Final    Comment:    . If a non-fasting specimen was collected, consider repeat triglyceride  testing on a fasting specimen if clinically indicated.  Imagene Mam et al. J. of Clin. Lipidol. 2015;9:129-169. Aaron Aas          Passed - Completed PHQ-2 or PHQ-9 in the last 360 days      Passed - Last BP in normal range    BP Readings from Last 1 Encounters:  02/02/24 126/82

## 2024-03-09 NOTE — Telephone Encounter (Signed)
 Medication refill last sent to [facility] on 02/02/24 at 3:10 pm . Called pharmacy and pharmacy staff reports patient did not pick up Rx , medication never dispensed on 02/02/24.

## 2024-03-10 ENCOUNTER — Other Ambulatory Visit: Payer: Self-pay | Admitting: Family Medicine

## 2024-03-10 DIAGNOSIS — F339 Major depressive disorder, recurrent, unspecified: Secondary | ICD-10-CM

## 2024-03-31 ENCOUNTER — Ambulatory Visit

## 2024-03-31 VITALS — BP 126/86 | Ht 68.0 in | Wt 202.0 lb

## 2024-03-31 DIAGNOSIS — Z5320 Procedure and treatment not carried out because of patient's decision for unspecified reasons: Secondary | ICD-10-CM

## 2024-03-31 DIAGNOSIS — Z0000 Encounter for general adult medical examination without abnormal findings: Secondary | ICD-10-CM | POA: Diagnosis not present

## 2024-03-31 DIAGNOSIS — Z2821 Immunization not carried out because of patient refusal: Secondary | ICD-10-CM

## 2024-03-31 NOTE — Progress Notes (Signed)
 Because this visit was a virtual/telehealth visit,  certain criteria was not obtained, such a blood pressure, CBG if applicable, and timed get up and go. Any medications not marked as "taking" were not mentioned during the medication reconciliation part of the visit. Any vitals not documented were not able to be obtained due to this being a telehealth visit or patient was unable to self-report a recent blood pressure reading due to a lack of equipment at home via telehealth. Vitals that have been documented are verbally provided by the patient.   This visit was performed by a medical professional under my direct supervision. I was immediately available for consultation/collaboration. I have reviewed and agree with the Annual Wellness Visit documentation.  Subjective:   [name] is a 63 y.o. who presents for a Medicare Wellness preventive visit.  As a reminder, Annual Wellness Visits don't include a physical exam, and some assessments may be limited, especially if this visit is performed virtually. We may recommend an in-person follow-up visit with your provider if needed.  Visit Complete: Virtual I connected with  Theora G Beeghly on 03/31/24 by a audio enabled telemedicine application and verified that I am speaking with the correct person using two identifiers.  Patient Location: Home  Provider Location: Home Office  I discussed the limitations of evaluation and management by telemedicine. The patient expressed understanding and agreed to proceed.  Vital Signs: Because this visit was a virtual/telehealth visit, some criteria may be missing or patient reported. Any vitals not documented were not able to be obtained and vitals that have been documented are patient reported.  VideoDeclined- This patient declined Librarian, academic. Therefore the visit was completed with audio only.  Persons Participating in Visit: Patient.  AWV Questionnaire: No: Patient  Medicare AWV questionnaire was not completed prior to this visit.  Cardiac Risk Factors include: advanced age (>24men, >48 women);dyslipidemia;obesity (BMI >30kg/m2);hypertension;Other (see comment), Risk factor comments: copd , tia     Objective:     Today's Vitals   03/31/24 0856  BP: 126/86  Weight: 202 lb (91.6 kg)  Height: 5\' 8"  (1.727 m)   Body mass index is 30.71 kg/m.     03/31/2024    8:56 AM 12/08/2023   12:51 PM 05/13/2023    6:09 PM 03/27/2022    8:04 AM 03/25/2022    7:40 AM 02/18/2022    8:05 PM 01/02/2022    8:43 AM  Advanced Directives  Does Patient Have a Medical Advance Directive? No No No  No No No  Would patient like information on creating a medical advance directive? No - Patient declined     No - Patient declined No - Patient declined     Information is confidential and restricted. Go to Review Flowsheets to unlock data.    Current Medications (verified) Outpatient Encounter Medications as of 03/31/2024  Medication Sig   amitriptyline  (ELAVIL ) 10 MG tablet Take 1 tablet (10 mg total) by mouth at bedtime.   amLODipine  (NORVASC ) 5 MG tablet Take 1 tablet (5 mg total) by mouth daily.   aspirin  EC 81 MG EC tablet Take 1 tablet (81 mg total) by mouth daily. Swallow whole.   atorvastatin  (LIPITOR) 20 MG tablet Take 1 tablet (20 mg total) by mouth daily.   carvedilol  (COREG ) 6.25 MG tablet TAKE 1 TABLET(6.25 MG) BY MOUTH TWICE DAILY   cholecalciferol  (VITAMIN D ) 1000 units tablet Take 1,000 Units by mouth daily.   Cyanocobalamin  (B-12) 1000 MCG SUBL Place  1 each under the tongue 3 (three) times a week.   desvenlafaxine  (PRISTIQ ) 50 MG 24 hr tablet Take 1 tablet (50 mg total) by mouth daily.   gabapentin  (NEURONTIN ) 300 MG capsule Take 300 mg by mouth every 6 (six) hours as needed (Nerve pain).   losartan -hydrochlorothiazide  (HYZAAR) 100-25 MG tablet Take 1 tablet by mouth daily.   metFORMIN  (GLUCOPHAGE ) 500 MG tablet Take 1 tablet (500 mg total) by mouth daily with  breakfast.   Multiple Vitamins-Minerals (MULTIVITAMIN WITH MINERALS) tablet Take 1 tablet by mouth every other day.   NARCAN 4 MG/0.1ML LIQD nasal spray kit Place 1 spray into the nose once.   Oxycodone  HCl 20 MG TABS Take 20 mg by mouth every 4 (four) hours as needed (pain).   pantoprazole  (PROTONIX ) 40 MG tablet Take 1 tablet (40 mg total) by mouth daily.   QUEtiapine  (SEROQUEL ) 25 MG tablet Take 1 tablet (25 mg total) by mouth at bedtime.   spironolactone  (ALDACTONE ) 25 MG tablet TAKE 1/2 TABLET(12.5 MG) BY MOUTH DAILY   tiZANidine  (ZANAFLEX ) 4 MG tablet Take 4 mg by mouth every 8 (eight) hours.   valACYclovir  (VALTREX ) 1000 MG tablet TAKE 1 TABLET(1000 MG) BY MOUTH TWICE DAILY AS NEEDED   No facility-administered encounter medications on file as of 03/31/2024.    Allergies (verified) Patient has no known allergies.   History: Past Medical History:  Diagnosis Date   Abdominal aortic atherosclerosis (HCC)    Anemia    Arthritis    bilateral knees   Bronchitis    Carpal tunnel syndrome, bilateral    Contact dermatitis and other eczema, due to unspecified cause    COPD (chronic obstructive pulmonary disease) (HCC)    DDD (degenerative disc disease)    Depressive disorder    Diverticulosis    Dysmetabolic syndrome X    Eczema    GERD (gastroesophageal reflux disease)    Hyperlipidemia    Hypertension    IBS (irritable bowel syndrome)    Insomnia    Lumbago    Nonspecific abnormal electrocardiogram (ECG) (EKG)    Osteoarthritis of both knees    Other ovarian failure(256.39)    Peptic ulcer    Postmenopausal atrophic vaginitis    Symptomatic menopausal or female climacteric states    Unspecified vitamin D  deficiency    Past Surgical History:  Procedure Laterality Date   ACHILLES TENDON SURGERY Left 01/02/2022   Procedure: ACHILLES TENDON REPAIR-SECONDARY;  Surgeon: Anell Baptist, DPM;  Location: [facility];  Service: Podiatry;  Laterality: Left;   BACK SURGERY     BREAST  EXCISIONAL BIOPSY Left ?   neg   BREAST LUMPECTOMY Left 01/08/2021   Procedure: BREAST LUMPECTOMY;  Surgeon: Flynn Hylan, MD;  Location: [facility];  Service: General;  Laterality: Left;   CARPAL TUNNEL RELEASE Left 08/15/2018   Procedure: CARPAL TUNNEL RELEASE;  Surgeon: Marlynn Singer, MD;  Location: [facility];  Service: Orthopedics;  Laterality: Left;   CERVICAL DISCECTOMY     x 2; metal plate   CHOLECYSTECTOMY     COLONOSCOPY WITH PROPOFOL  N/A 08/08/2018   Procedure: COLONOSCOPY WITH PROPOFOL ;  Surgeon: Irby Mannan, MD;  Location: [facility];  Service: Endoscopy;  Laterality: N/A;   COLONOSCOPY WITH PROPOFOL  N/A 12/01/2021   Procedure: COLONOSCOPY WITH PROPOFOL ;  Surgeon: Selena Daily, MD;  Location: [facility];  Service: Gastroenterology;  Laterality: N/A;   DILATATION & CURETTAGE/HYSTEROSCOPY WITH MYOSURE N/A 03/21/2015   Procedure: DILATATION & CURETTAGE/HYSTEROSCOPY;  Surgeon: Raynell Caller, MD;  Location: [facility];  Service: Gynecology;  Laterality: N/A;   DILATION AND CURETTAGE OF UTERUS     ENDOMETRIAL ABLATION     ESOPHAGOGASTRODUODENOSCOPY (EGD) WITH PROPOFOL  N/A 04/13/2018   Procedure: ESOPHAGOGASTRODUODENOSCOPY (EGD) WITH PROPOFOL ;  Surgeon: Irby Mannan, MD;  Location: [facility];  Service: Endoscopy;  Laterality: N/A;   ESOPHAGOGASTRODUODENOSCOPY (EGD) WITH PROPOFOL  N/A 08/08/2018   Procedure: ESOPHAGOGASTRODUODENOSCOPY (EGD) WITH PROPOFOL ;  Surgeon: Irby Mannan, MD;  Location: [facility];  Service: Endoscopy;  Laterality: N/A;   ESOPHAGOGASTRODUODENOSCOPY (EGD) WITH PROPOFOL  N/A 03/25/2022   Procedure: ESOPHAGOGASTRODUODENOSCOPY (EGD) WITH PROPOFOL ;  Surgeon: Selena Daily, MD;  Location: [facility];  Service: Gastroenterology;  Laterality: N/A;   ESOPHAGOGASTRODUODENOSCOPY (EGD) WITH PROPOFOL  N/A 03/27/2022   Procedure: ESOPHAGOGASTRODUODENOSCOPY (EGD) WITH PROPOFOL ;  Surgeon: Selena Daily, MD;  Location: [facility]  ENDOSCOPY;  Service: Gastroenterology;  Laterality: N/A;   JOINT REPLACEMENT     OSTECTOMY Left 01/02/2022   Procedure: CALCANEAL EXOSTECTOMY;  Surgeon: Anell Baptist, DPM;  Location: [facility];  Service: Podiatry;  Laterality: Left;   SPINAL FUSION     lumbar x2   TONSILLECTOMY     TOTAL KNEE ARTHROPLASTY Left 04/12/2019   Procedure: TOTAL KNEE ARTHROPLASTY;  Surgeon: Jerlyn Moons, MD;  Location: [facility];  Service: Orthopedics;  Laterality: Left;   TOTAL KNEE ARTHROPLASTY Right 04/17/2020   Procedure: TOTAL KNEE ARTHROPLASTY;  Surgeon: Jerlyn Moons, MD;  Location: [facility];  Service: Orthopedics;  Laterality: Right;   TUBAL LIGATION     Family History  Problem Relation Age of Onset   Heart disease Mother    Thyroid  cancer Mother    Congestive Heart Failure Mother    Hypertension Father    Heart failure Maternal Uncle    Diabetes Maternal Uncle    Kidney disease Maternal Uncle    Breast cancer Maternal Grandmother    Heart failure Maternal Grandfather    Depression Daughter    Asthma Daughter    Social History   Socioeconomic History   Marital status: Married    Spouse name: Hilario Lover   Number of children: 4   Years of education: Not on file   Highest education level: 12th grade  Occupational History   Occupation: disabled    Comment: chronic back pain   Tobacco Use   Smoking status: Former    Current packs/day: 0.00    Average packs/day: 1 pack/day for 20.0 years (20.0 ttl pk-yrs)    Types: Cigarettes    Start date: 07/26/2002    Quit date: 04/06/2019    Years since quitting: 4.9   Smokeless tobacco: Former    Quit date: 04/05/2022   Tobacco comments:    Quit Date 04/05/2022  Vaping Use   Vaping status: Never Used  Substance and Sexual Activity   Alcohol  use: Yes    Comment: occasional beer   Drug use: No   Sexual activity: Yes    Partners: Male    Birth control/protection: Other-see comments    Comment: Ablation  Other Topics Concern   Not on file  Social  History Narrative   Not on file   Social Drivers of Health   Financial Resource Strain: Low Risk  (03/31/2024)   Overall Financial Resource Strain (CARDIA)    Difficulty of Paying Living Expenses: Not hard at all  Food Insecurity: No Food Insecurity (03/31/2024)   Hunger Vital Sign    Worried About Running Out of Food in the Last Year: Never true    Ran  Out of Food in the Last Year: Never true  Transportation Needs: No Transportation Needs (03/31/2024)   PRAPARE - Administrator, Civil Service (Medical): No    Lack of Transportation (Non-Medical): No  Physical Activity: Patient Declined (03/31/2024)   Exercise Vital Sign    Days of Exercise per Week: Patient declined    Minutes of Exercise per Session: Patient declined  Stress: No Stress Concern Present (03/31/2024)   [employer] of Occupational Health - Occupational Stress Questionnaire    Feeling of Stress : Not at all  Social Connections: Socially Isolated (03/31/2024)   Social Connection and Isolation Panel [NHANES]    Frequency of Communication with Friends and Family: More than three times a week    Frequency of Social Gatherings with Friends and Family: Three times a week    Attends Religious Services: Never    Active Member of Clubs or Organizations: No    Attends Banker Meetings: Never    Marital Status: Separated    Tobacco Counseling Counseling given: Not Answered Tobacco comments: Quit Date 04/05/2022    Clinical Intake:  Pre-visit preparation completed: Yes  Pain : No/denies pain     BMI - recorded: 30.71 Nutritional Status: BMI > 30  Obese Nutritional Risks: None Diabetes: No  Lab Results  Component Value Date   HGBA1C 6.5 (H) 10/19/2023   HGBA1C 6.4 (H) 02/18/2022   HGBA1C 5.8 (H) 08/07/2020     What is the last grade level you completed in school?: Some college  Interpreter Needed?: No  Information entered by :: [employer]   Activities of Daily Living      03/31/2024    8:59 AM 08/12/2023    3:08 PM  In your present state of health, do you have any difficulty performing the following activities:  Hearing? 0 0  Vision? 0 0  Difficulty concentrating or making decisions? 0 0  Walking or climbing stairs? 0 1  Dressing or bathing? 0 0  Doing errands, shopping? 0 0  Preparing Food and eating ? N   Using the Toilet? N   In the past six months, have you accidently leaked urine? N   Do you have problems with loss of bowel control? N   Managing your Medications? N   Managing your Finances? N   Housekeeping or managing your Housekeeping? N     Patient Care Team: Sowles, Krichna, MD as PCP - General (Family Medicine) Constancia Delton, MD as PCP - Cardiology (Cardiology) Gayla Katz, MD as Consulting Physician (Anesthesiology) Irby Mannan, MD (Inactive) as Consulting Physician (Gastroenterology) Marlynn Singer, MD as Consulting Physician (Orthopedic Surgery)  Indicate any recent Medical Services you may have received from other than [facility] providers in the past year (date may be approximate).     Assessment:    This is a routine wellness examination for Taisley.  Hearing/Vision screen Hearing Screening - Comments:: Patient has no hearing issues Vision Screening - Comments:: Patient wears glasses    Goals Addressed             This Visit's Progress    Quit Smoking   On track    Recommend to continue efforts to reduce smoking habits until no longer smoking (Smoking Cessation literature attached to AVS).       Depression Screen     03/31/2024    9:00 AM 02/02/2024    2:33 PM 10/19/2023    2:52 PM 08/12/2023    3:08 PM 07/19/2023  1:42 PM 05/12/2023    2:35 PM 11/09/2022    1:12 PM  PHQ 2/9 Scores  PHQ - 2 Score 2 2 4  0 1 1 2   PHQ- 9 Score 4 4 7  3 4 5     Fall Risk     03/31/2024    8:58 AM 10/19/2023    2:51 PM 08/12/2023    3:08 PM 07/19/2023    1:42 PM 05/12/2023    2:35 PM  Fall Risk   Falls  in the past year? 0 1 1 1 1   Number falls in past yr: 0 0 1 0 0  Injury with Fall? 0 0 1 1 1   Risk for fall due to : No Fall Risks Impaired balance/gait No Fall Risks;Impaired mobility;Impaired balance/gait;History of fall(s) No Fall Risks Impaired balance/gait  Follow up Falls evaluation completed Falls prevention discussed;Education provided;Falls evaluation completed Falls prevention discussed  Falls prevention discussed;Education provided;Falls evaluation completed    MEDICARE RISK AT HOME:  Medicare Risk at Home Any stairs in or around the home?: Yes If so, are there any without handrails?: No Home free of loose throw rugs in walkways, pet beds, electrical cords, etc?: Yes Adequate lighting in your home to reduce risk of falls?: Yes Life alert?: No Use of a cane, walker or w/c?: No Grab bars in the bathroom?: No Shower chair or bench in shower?: No Elevated toilet seat or a handicapped toilet?: Yes  TIMED UP AND GO:  Was the test performed?  No  Cognitive Function: 6CIT completed        03/31/2024    8:58 AM 11/09/2022    1:12 PM 07/28/2018    8:47 AM  6CIT Screen  What Year? 0 points 0 points 0 points  What month? 0 points 0 points 0 points  What time? 0 points 0 points 0 points  Count back from 20 0 points 0 points 0 points  Months in reverse 0 points 0 points 0 points  Repeat phrase 0 points 0 points 0 points  Total Score 0 points 0 points 0 points    Immunizations Immunization History  Administered Date(s) Administered   Influenza, Seasonal, Injecte, Preservative Fre 10/19/2023   Influenza,inj,Quad PF,6+ Mos 08/08/2015, 08/26/2016, 07/26/2017, 07/28/2018, 08/01/2019, 08/07/2020, 11/06/2021, 09/22/2022   Influenza-Unspecified 09/02/2013, 07/03/2014   PFIZER(Purple Top)SARS-COV-2 Vaccination 03/13/2020, 04/03/2020, 11/08/2020   PNEUMOCOCCAL CONJUGATE-20 06/04/2021   Pfizer Covid-19 Vaccine Bivalent Booster 6yrs & up 08/25/2021   Zoster Recombinant(Shingrix )  02/21/2014, 08/25/2021   Zoster, Live 02/21/2014    Screening Tests Health Maintenance  Topic Date Due   MAMMOGRAM  12/31/2021   COVID-19 Vaccine (5 - 2024-25 season) 07/04/2023   Cervical Cancer Screening (HPV/Pap Cotest)  08/04/2023   DTaP/Tdap/Td (1 - Tdap) 02/01/2025 (Originally 11/25/1979)   INFLUENZA VACCINE  06/02/2024   Lung Cancer Screening  06/09/2024   Medicare Annual Wellness (AWV)  03/31/2025   Colonoscopy  12/02/2031   Pneumococcal Vaccine 58-31 Years old  Completed   Hepatitis C Screening  Completed   HIV Screening  Completed   Zoster Vaccines- Shingrix   Completed   HPV VACCINES  Aged Out   Meningococcal B Vaccine  Aged Out    Health Maintenance  Health Maintenance Due  Topic Date Due   MAMMOGRAM  12/31/2021   COVID-19 Vaccine (5 - 2024-25 season) 07/04/2023   Cervical Cancer Screening (HPV/Pap Cotest)  08/04/2023   Health Maintenance Items Addressed:patient declined   Additional Screening:  Vision Screening: Recommended annual ophthalmology exams for early detection  of glaucoma and other disorders of the eye.  Dental Screening: Recommended annual dental exams for proper oral hygiene  Community Resource Referral / Chronic Care Management: CRR required this visit?  No   CCM required this visit?  No   Plan:    I have personally reviewed and noted the following in the patient's chart:   Medical and social history Use of alcohol , tobacco or illicit drugs  Current medications and supplements including opioid prescriptions. Patient is not currently taking opioid prescriptions. Functional ability and status Nutritional status Physical activity Advanced directives List of other physicians Hospitalizations, surgeries, and ER visits in previous 12 months Vitals Screenings to include cognitive, depression, and falls Referrals and appointments  In addition, I have reviewed and discussed with patient certain preventive protocols, quality metrics, and  best practice recommendations. A written personalized care plan for preventive services as well as general preventive health recommendations were provided to patient.   Freeda Jerry, [place]   03/31/2024   After Visit Summary: (MyChart) Due to this being a telephonic visit, the after visit summary with patients personalized plan was offered to patient via MyChart   Notes: Nothing significant to report at this time.

## 2024-03-31 NOTE — Patient Instructions (Signed)
 [name] , Thank you for taking time out of your busy schedule to complete your Annual Wellness Visit with me. I enjoyed our conversation and look forward to speaking with you again next year. I, as well as your care team,  appreciate your ongoing commitment to your health goals. Please review the following plan we discussed and let me know if I can assist you in the future. Your Game plan/ To Do List    Referrals: If you haven't heard from the office you've been referred to, please reach out to them at the phone provided.   Follow up Visits: Next Medicare AWV with our clinical staff: 04/19/2025   Have you seen your provider in the last 6 months (3 months if uncontrolled diabetes)? No Next Office Visit with your provider: none  Clinician Recommendations:  Aim for 30 minutes of exercise or brisk walking, 6-8 glasses of water , and 5 servings of fruits and vegetables each day.       This is a list of the screening recommended for you and due dates:  Health Maintenance  Topic Date Due   Mammogram  12/31/2021   COVID-19 Vaccine (5 - 2024-25 season) 07/04/2023   Pap with HPV screening  08/04/2023   DTaP/Tdap/Td vaccine (1 - Tdap) 02/01/2025*   Flu Shot  06/02/2024   Screening for Lung Cancer  06/09/2024   Medicare Annual Wellness Visit  03/31/2025   Colon Cancer Screening  12/02/2031   Pneumococcal Vaccination  Completed   Hepatitis C Screening  Completed   HIV Screening  Completed   Zoster (Shingles) Vaccine  Completed   HPV Vaccine  Aged Out   Meningitis B Vaccine  Aged Out  *Topic was postponed. The date shown is not the original due date.    Advanced directives: (Declined) Advance directive discussed with you today. Even though you declined this today, please call our office should you change your mind, and we can give you the proper paperwork for you to fill out. Advance Care Planning is important because it:  [x]  Makes sure you receive the medical care that is consistent with  your values, goals, and preferences  [x]  It provides guidance to your family and loved ones and reduces their decisional burden about whether or not they are making the right decisions based on your wishes.  Follow the link provided in your after visit summary or read over the paperwork we have mailed to you to help you started getting your Advance Directives in place. If you need assistance in completing these, please reach out to us  so that we can help you!  See attachments for Preventive Care and Fall Prevention Tips.

## 2024-04-07 DIAGNOSIS — M25552 Pain in left hip: Secondary | ICD-10-CM | POA: Diagnosis not present

## 2024-04-07 DIAGNOSIS — G894 Chronic pain syndrome: Secondary | ICD-10-CM | POA: Diagnosis not present

## 2024-04-07 DIAGNOSIS — M25551 Pain in right hip: Secondary | ICD-10-CM | POA: Diagnosis not present

## 2024-04-07 DIAGNOSIS — M170 Bilateral primary osteoarthritis of knee: Secondary | ICD-10-CM | POA: Diagnosis not present

## 2024-04-07 DIAGNOSIS — M79662 Pain in left lower leg: Secondary | ICD-10-CM | POA: Diagnosis not present

## 2024-04-07 DIAGNOSIS — Z79891 Long term (current) use of opiate analgesic: Secondary | ICD-10-CM | POA: Diagnosis not present

## 2024-04-11 ENCOUNTER — Encounter

## 2024-04-26 ENCOUNTER — Ambulatory Visit
Admission: RE | Admit: 2024-04-26 | Discharge: 2024-04-26 | Disposition: A | Discharge status: Home / Self Care | Source: Ambulatory Visit | Attending: Family Medicine | Admitting: Family Medicine

## 2024-04-26 DIAGNOSIS — Z1231 Encounter for screening mammogram for malignant neoplasm of breast: Secondary | ICD-10-CM | POA: Diagnosis not present

## 2024-05-02 ENCOUNTER — Other Ambulatory Visit: Payer: Self-pay | Admitting: Acute Care

## 2024-05-02 DIAGNOSIS — Z122 Encounter for screening for malignant neoplasm of respiratory organs: Secondary | ICD-10-CM

## 2024-05-02 DIAGNOSIS — Z87891 Personal history of nicotine dependence: Secondary | ICD-10-CM

## 2024-05-03 ENCOUNTER — Ambulatory Visit: Payer: Self-pay | Admitting: Family Medicine

## 2024-05-09 DIAGNOSIS — M25511 Pain in right shoulder: Secondary | ICD-10-CM | POA: Diagnosis not present

## 2024-05-09 DIAGNOSIS — G894 Chronic pain syndrome: Secondary | ICD-10-CM | POA: Diagnosis not present

## 2024-05-09 DIAGNOSIS — M545 Low back pain, unspecified: Secondary | ICD-10-CM | POA: Diagnosis not present

## 2024-05-09 DIAGNOSIS — Z79891 Long term (current) use of opiate analgesic: Secondary | ICD-10-CM | POA: Diagnosis not present

## 2024-05-09 DIAGNOSIS — M25512 Pain in left shoulder: Secondary | ICD-10-CM | POA: Diagnosis not present

## 2024-05-22 ENCOUNTER — Ambulatory Visit
Admission: RE | Admit: 2024-05-22 | Discharge: 2024-05-22 | Disposition: A | Discharge status: Home / Self Care | Source: Ambulatory Visit | Attending: Acute Care | Admitting: Acute Care

## 2024-05-22 DIAGNOSIS — Z122 Encounter for screening for malignant neoplasm of respiratory organs: Secondary | ICD-10-CM | POA: Diagnosis not present

## 2024-05-22 DIAGNOSIS — Z87891 Personal history of nicotine dependence: Secondary | ICD-10-CM | POA: Insufficient documentation

## 2024-05-28 ENCOUNTER — Emergency Department

## 2024-05-28 ENCOUNTER — Other Ambulatory Visit: Payer: Self-pay

## 2024-05-28 ENCOUNTER — Inpatient Hospital Stay
Admission: EM | Admit: 2024-05-28 | Discharge: 2024-05-30 | DRG: 537 | Disposition: A | Discharge status: Home / Self Care | Source: Ambulatory Visit | Attending: Internal Medicine | Admitting: Internal Medicine

## 2024-05-28 DIAGNOSIS — I160 Hypertensive urgency: Secondary | ICD-10-CM | POA: Diagnosis present

## 2024-05-28 DIAGNOSIS — E860 Dehydration: Secondary | ICD-10-CM | POA: Diagnosis present

## 2024-05-28 DIAGNOSIS — G894 Chronic pain syndrome: Secondary | ICD-10-CM | POA: Diagnosis present

## 2024-05-28 DIAGNOSIS — W010XXA Fall on same level from slipping, tripping and stumbling without subsequent striking against object, initial encounter: Secondary | ICD-10-CM | POA: Diagnosis present

## 2024-05-28 DIAGNOSIS — M7062 Trochanteric bursitis, left hip: Secondary | ICD-10-CM | POA: Diagnosis present

## 2024-05-28 DIAGNOSIS — W19XXXS Unspecified fall, sequela: Principal | ICD-10-CM

## 2024-05-28 DIAGNOSIS — D72829 Elevated white blood cell count, unspecified: Secondary | ICD-10-CM | POA: Diagnosis present

## 2024-05-28 DIAGNOSIS — D259 Leiomyoma of uterus, unspecified: Secondary | ICD-10-CM | POA: Diagnosis not present

## 2024-05-28 DIAGNOSIS — Z87891 Personal history of nicotine dependence: Secondary | ICD-10-CM | POA: Diagnosis not present

## 2024-05-28 DIAGNOSIS — I5032 Chronic diastolic (congestive) heart failure: Secondary | ICD-10-CM | POA: Diagnosis not present

## 2024-05-28 DIAGNOSIS — Y92009 Unspecified place in unspecified non-institutional (private) residence as the place of occurrence of the external cause: Secondary | ICD-10-CM | POA: Diagnosis not present

## 2024-05-28 DIAGNOSIS — Z818 Family history of other mental and behavioral disorders: Secondary | ICD-10-CM

## 2024-05-28 DIAGNOSIS — Z6830 Body mass index (BMI) 30.0-30.9, adult: Secondary | ICD-10-CM | POA: Diagnosis not present

## 2024-05-28 DIAGNOSIS — Z7982 Long term (current) use of aspirin: Secondary | ICD-10-CM

## 2024-05-28 DIAGNOSIS — I110 Hypertensive heart disease with heart failure: Secondary | ICD-10-CM | POA: Diagnosis present

## 2024-05-28 DIAGNOSIS — G8929 Other chronic pain: Secondary | ICD-10-CM | POA: Diagnosis not present

## 2024-05-28 DIAGNOSIS — Z7984 Long term (current) use of oral hypoglycemic drugs: Secondary | ICD-10-CM

## 2024-05-28 DIAGNOSIS — E8881 Metabolic syndrome: Secondary | ICD-10-CM | POA: Diagnosis present

## 2024-05-28 DIAGNOSIS — Z808 Family history of malignant neoplasm of other organs or systems: Secondary | ICD-10-CM

## 2024-05-28 DIAGNOSIS — S76012A Strain of muscle, fascia and tendon of left hip, initial encounter: Secondary | ICD-10-CM | POA: Diagnosis present

## 2024-05-28 DIAGNOSIS — Z8249 Family history of ischemic heart disease and other diseases of the circulatory system: Secondary | ICD-10-CM | POA: Diagnosis not present

## 2024-05-28 DIAGNOSIS — J449 Chronic obstructive pulmonary disease, unspecified: Secondary | ICD-10-CM | POA: Diagnosis present

## 2024-05-28 DIAGNOSIS — S0993XA Unspecified injury of face, initial encounter: Secondary | ICD-10-CM | POA: Diagnosis not present

## 2024-05-28 DIAGNOSIS — R7989 Other specified abnormal findings of blood chemistry: Secondary | ICD-10-CM

## 2024-05-28 DIAGNOSIS — E785 Hyperlipidemia, unspecified: Secondary | ICD-10-CM | POA: Diagnosis present

## 2024-05-28 DIAGNOSIS — Z981 Arthrodesis status: Secondary | ICD-10-CM

## 2024-05-28 DIAGNOSIS — R7303 Prediabetes: Secondary | ICD-10-CM | POA: Diagnosis not present

## 2024-05-28 DIAGNOSIS — F339 Major depressive disorder, recurrent, unspecified: Secondary | ICD-10-CM | POA: Diagnosis present

## 2024-05-28 DIAGNOSIS — Z96652 Presence of left artificial knee joint: Secondary | ICD-10-CM | POA: Diagnosis not present

## 2024-05-28 DIAGNOSIS — E669 Obesity, unspecified: Secondary | ICD-10-CM | POA: Diagnosis not present

## 2024-05-28 DIAGNOSIS — S7002XA Contusion of left hip, initial encounter: Secondary | ICD-10-CM | POA: Diagnosis not present

## 2024-05-28 DIAGNOSIS — Z833 Family history of diabetes mellitus: Secondary | ICD-10-CM

## 2024-05-28 DIAGNOSIS — K219 Gastro-esophageal reflux disease without esophagitis: Secondary | ICD-10-CM | POA: Diagnosis present

## 2024-05-28 DIAGNOSIS — M25552 Pain in left hip: Secondary | ICD-10-CM | POA: Diagnosis not present

## 2024-05-28 DIAGNOSIS — S72002A Fracture of unspecified part of neck of left femur, initial encounter for closed fracture: Secondary | ICD-10-CM | POA: Diagnosis not present

## 2024-05-28 DIAGNOSIS — E66811 Obesity, class 1: Secondary | ICD-10-CM | POA: Diagnosis present

## 2024-05-28 DIAGNOSIS — Z9049 Acquired absence of other specified parts of digestive tract: Secondary | ICD-10-CM

## 2024-05-28 DIAGNOSIS — R7402 Elevation of levels of lactic acid dehydrogenase (LDH): Secondary | ICD-10-CM | POA: Diagnosis not present

## 2024-05-28 DIAGNOSIS — Z043 Encounter for examination and observation following other accident: Secondary | ICD-10-CM | POA: Diagnosis not present

## 2024-05-28 DIAGNOSIS — Z96653 Presence of artificial knee joint, bilateral: Secondary | ICD-10-CM | POA: Diagnosis present

## 2024-05-28 DIAGNOSIS — T07XXXA Unspecified multiple injuries, initial encounter: Secondary | ICD-10-CM | POA: Diagnosis present

## 2024-05-28 DIAGNOSIS — E872 Acidosis, unspecified: Secondary | ICD-10-CM | POA: Diagnosis not present

## 2024-05-28 DIAGNOSIS — M1612 Unilateral primary osteoarthritis, left hip: Secondary | ICD-10-CM | POA: Diagnosis not present

## 2024-05-28 DIAGNOSIS — Z825 Family history of asthma and other chronic lower respiratory diseases: Secondary | ICD-10-CM

## 2024-05-28 DIAGNOSIS — Z8711 Personal history of peptic ulcer disease: Secondary | ICD-10-CM

## 2024-05-28 DIAGNOSIS — I10 Essential (primary) hypertension: Secondary | ICD-10-CM | POA: Diagnosis not present

## 2024-05-28 DIAGNOSIS — W19XXXA Unspecified fall, initial encounter: Secondary | ICD-10-CM

## 2024-05-28 DIAGNOSIS — M79652 Pain in left thigh: Secondary | ICD-10-CM | POA: Diagnosis not present

## 2024-05-28 DIAGNOSIS — Z8673 Personal history of transient ischemic attack (TIA), and cerebral infarction without residual deficits: Secondary | ICD-10-CM | POA: Diagnosis not present

## 2024-05-28 DIAGNOSIS — Z79899 Other long term (current) drug therapy: Secondary | ICD-10-CM

## 2024-05-28 DIAGNOSIS — M25512 Pain in left shoulder: Secondary | ICD-10-CM | POA: Diagnosis not present

## 2024-05-28 DIAGNOSIS — M160 Bilateral primary osteoarthritis of hip: Secondary | ICD-10-CM | POA: Diagnosis not present

## 2024-05-28 DIAGNOSIS — Z803 Family history of malignant neoplasm of breast: Secondary | ICD-10-CM

## 2024-05-28 HISTORY — DX: Cerebral infarction, unspecified: I63.9

## 2024-05-28 HISTORY — DX: Heart failure, unspecified: I50.9

## 2024-05-28 LAB — BASIC METABOLIC PANEL WITH GFR
Anion gap: 17 — ABNORMAL HIGH (ref 5–15)
BUN: 8 mg/dL (ref 8–23)
CO2: 20 mmol/L — ABNORMAL LOW (ref 22–32)
Calcium: 10.1 mg/dL (ref 8.9–10.3)
Chloride: 104 mmol/L (ref 98–111)
Creatinine, Ser: 0.69 mg/dL (ref 0.44–1.00)
GFR, Estimated: >60 mL/min (ref >60)
Glucose, Bld: 101 mg/dL — ABNORMAL HIGH (ref 70–99)
Potassium: 3.8 mmol/L (ref 3.5–5.1)
Sodium: 141 mmol/L (ref 135–145)

## 2024-05-28 LAB — LACTIC ACID, PLASMA
Lactic Acid, Venous: 3.2 mmol/L (ref 0.5–1.9)
Lactic Acid, Venous: 3 mmol/L (ref 0.5–1.9)
Lactic Acid, Venous: 3.2 mmol/L (ref 0.5–1.9)

## 2024-05-28 LAB — CBC WITH DIFFERENTIAL/PLATELET
Abs Immature Granulocytes: 0.04 K/uL (ref 0.00–0.07)
Basophils Absolute: 0.1 K/uL (ref 0.0–0.1)
Basophils Relative: 1 %
Eosinophils Absolute: 0.2 K/uL (ref 0.0–0.5)
Eosinophils Relative: 1 %
HCT: 42.1 % (ref 36.0–46.0)
Hemoglobin: 13.6 g/dL (ref 12.0–15.0)
Immature Granulocytes: 0 %
Lymphocytes Relative: 33 %
Lymphs Abs: 3.7 K/uL (ref 0.7–4.0)
MCH: 30.9 pg (ref 26.0–34.0)
MCHC: 32.3 g/dL (ref 30.0–36.0)
MCV: 95.7 fL (ref 80.0–100.0)
Monocytes Absolute: 0.7 K/uL (ref 0.1–1.0)
Monocytes Relative: 7 %
Neutro Abs: 6.5 K/uL (ref 1.7–7.7)
Neutrophils Relative %: 58 %
Platelets: 426 K/uL — ABNORMAL HIGH (ref 150–400)
RBC: 4.4 MIL/uL (ref 3.87–5.11)
RDW: 12.4 % (ref 11.5–15.5)
WBC: 11.2 K/uL — ABNORMAL HIGH (ref 4.0–10.5)
nRBC: 0 % (ref 0.0–0.2)

## 2024-05-28 LAB — TYPE AND SCREEN
ABO/RH(D): A POS
Antibody Screen: NEGATIVE

## 2024-05-28 LAB — BETA-HYDROXYBUTYRIC ACID: Beta-Hydroxybutyric Acid: 0.23 mmol/L (ref 0.05–0.27)

## 2024-05-28 LAB — CK: Total CK: 54 U/L (ref 38–234)

## 2024-05-28 LAB — SALICYLATE LEVEL: Salicylate Lvl: <7 mg/dL — ABNORMAL LOW (ref 7.0–30.0)

## 2024-05-28 MED ORDER — GABAPENTIN 300 MG PO CAPS
300.0000 mg | ORAL_CAPSULE | Freq: Four times a day (QID) | ORAL | Status: DC | PRN
  Administered 2024-05-29: 300 mg via ORAL
  Filled 2024-05-28: qty 1

## 2024-05-28 MED ORDER — SPIRONOLACTONE 12.5 MG HALF TABLET: 12.5000 mg | ORAL_TABLET | Freq: Every day | ORAL | Status: DC

## 2024-05-28 MED ORDER — ENOXAPARIN SODIUM 40 MG/0.4ML IJ SOSY
40.0000 mg | PREFILLED_SYRINGE | INTRAMUSCULAR | Status: DC
  Administered 2024-05-28 – 2024-05-29 (×2): 40 mg via SUBCUTANEOUS
  Filled 2024-05-28 (×2): qty 0.4

## 2024-05-28 MED ORDER — FENTANYL CITRATE PF 50 MCG/ML IJ SOSY
50.0000 ug | PREFILLED_SYRINGE | Freq: Once | INTRAMUSCULAR | Status: AC
  Administered 2024-05-28: 50 ug via INTRAVENOUS
  Filled 2024-05-28: qty 1

## 2024-05-28 MED ORDER — VENLAFAXINE HCL ER 75 MG PO CP24
75.0000 mg | ORAL_CAPSULE | Freq: Every day | ORAL | Status: DC
  Administered 2024-05-29 – 2024-05-30 (×2): 75 mg via ORAL
  Filled 2024-05-28 (×2): qty 1

## 2024-05-28 MED ORDER — MORPHINE SULFATE (PF) 2 MG/ML IV SOLN
2.0000 mg | INTRAVENOUS | Status: DC | PRN
  Administered 2024-05-28 – 2024-05-29 (×4): 2 mg via INTRAVENOUS
  Filled 2024-05-28 (×4): qty 1

## 2024-05-28 MED ORDER — LIDOCAINE 5 % EX PTCH
1.0000 | MEDICATED_PATCH | CUTANEOUS | Status: DC
  Administered 2024-05-28 – 2024-05-29 (×2): 1 via TRANSDERMAL
  Filled 2024-05-28 (×2): qty 1

## 2024-05-28 MED ORDER — LOSARTAN POTASSIUM-HCTZ 100-25 MG PO TABS: 1.0000 | ORAL_TABLET | Freq: Every day | ORAL | Status: DC

## 2024-05-28 MED ORDER — AMLODIPINE BESYLATE 5 MG PO TABS
5.0000 mg | ORAL_TABLET | Freq: Once | ORAL | Status: AC
  Administered 2024-05-28: 5 mg via ORAL
  Filled 2024-05-28: qty 1

## 2024-05-28 MED ORDER — LABETALOL HCL 5 MG/ML IV SOLN
10.0000 mg | Freq: Once | INTRAVENOUS | Status: AC
  Administered 2024-05-28: 10 mg via INTRAVENOUS
  Filled 2024-05-28: qty 4

## 2024-05-28 MED ORDER — AMITRIPTYLINE HCL 10 MG PO TABS
10.0000 mg | ORAL_TABLET | Freq: Every day | ORAL | Status: DC
  Administered 2024-05-28 – 2024-05-29 (×2): 10 mg via ORAL
  Filled 2024-05-28 (×2): qty 1

## 2024-05-28 MED ORDER — LACTATED RINGERS IV BOLUS
500.0000 mL | Freq: Once | INTRAVENOUS | Status: AC
  Administered 2024-05-28: 500 mL via INTRAVENOUS

## 2024-05-28 MED ORDER — ACETAMINOPHEN 325 MG PO TABS: 650.0000 mg | ORAL_TABLET | Freq: Four times a day (QID) | ORAL | Status: DC | PRN

## 2024-05-28 MED ORDER — OXYCODONE-ACETAMINOPHEN 5-325 MG PO TABS
1.0000 | ORAL_TABLET | ORAL | Status: DC | PRN
  Administered 2024-05-29 – 2024-05-30 (×3): 1 via ORAL
  Filled 2024-05-28 (×3): qty 1

## 2024-05-28 MED ORDER — SODIUM CHLORIDE 0.9 % IV BOLUS: 500.0000 mL | Freq: Once | INTRAVENOUS | Status: DC

## 2024-05-28 MED ORDER — HYDROCHLOROTHIAZIDE 25 MG PO TABS: 25.0000 mg | ORAL_TABLET | Freq: Every day | ORAL | Status: DC

## 2024-05-28 MED ORDER — QUETIAPINE FUMARATE 25 MG PO TABS
25.0000 mg | ORAL_TABLET | Freq: Every day | ORAL | Status: DC
  Administered 2024-05-28 – 2024-05-29 (×2): 25 mg via ORAL
  Filled 2024-05-28 (×2): qty 1

## 2024-05-28 MED ORDER — LACTATED RINGERS IV BOLUS: 1000.0000 mL | Freq: Once | INTRAVENOUS | Status: DC

## 2024-05-28 MED ORDER — CARVEDILOL 3.125 MG PO TABS
6.2500 mg | ORAL_TABLET | Freq: Two times a day (BID) | ORAL | Status: DC
  Administered 2024-05-29 – 2024-05-30 (×3): 6.25 mg via ORAL
  Filled 2024-05-28 (×3): qty 2

## 2024-05-28 MED ORDER — AMLODIPINE BESYLATE 10 MG PO TABS
10.0000 mg | ORAL_TABLET | Freq: Every day | ORAL | Status: DC
  Administered 2024-05-29 – 2024-05-30 (×2): 10 mg via ORAL
  Filled 2024-05-28 (×2): qty 1

## 2024-05-28 MED ORDER — ALBUTEROL SULFATE (2.5 MG/3ML) 0.083% IN NEBU: 3.0000 mL | INHALATION_SOLUTION | RESPIRATORY_TRACT | Status: DC | PRN

## 2024-05-28 MED ORDER — DM-GUAIFENESIN ER 30-600 MG PO TB12: 1.0000 | ORAL_TABLET | Freq: Two times a day (BID) | ORAL | Status: DC | PRN

## 2024-05-28 MED ORDER — ADULT MULTIVITAMIN W/MINERALS CH
1.0000 | ORAL_TABLET | ORAL | Status: DC
  Administered 2024-05-29: 1 via ORAL
  Filled 2024-05-28: qty 1

## 2024-05-28 MED ORDER — LOSARTAN POTASSIUM 50 MG PO TABS
100.0000 mg | ORAL_TABLET | Freq: Every day | ORAL | Status: DC
  Administered 2024-05-29 – 2024-05-30 (×2): 100 mg via ORAL
  Filled 2024-05-28 (×2): qty 2

## 2024-05-28 MED ORDER — ONDANSETRON HCL 4 MG/2ML IJ SOLN: 4.0000 mg | Freq: Three times a day (TID) | INTRAMUSCULAR | Status: DC | PRN

## 2024-05-28 MED ORDER — MORPHINE SULFATE (PF) 4 MG/ML IV SOLN
4.0000 mg | Freq: Once | INTRAVENOUS | Status: AC
  Administered 2024-05-28: 4 mg via INTRAVENOUS
  Filled 2024-05-28: qty 1

## 2024-05-28 MED ORDER — VITAMIN D 25 MCG (1000 UNIT) PO TABS
1000.0000 [IU] | ORAL_TABLET | Freq: Every day | ORAL | Status: DC
  Administered 2024-05-29 – 2024-05-30 (×2): 1000 [IU] via ORAL
  Filled 2024-05-28 (×2): qty 1

## 2024-05-28 MED ORDER — METHOCARBAMOL 500 MG PO TABS
500.0000 mg | ORAL_TABLET | Freq: Three times a day (TID) | ORAL | Status: DC | PRN
  Administered 2024-05-29 – 2024-05-30 (×4): 500 mg via ORAL
  Filled 2024-05-28 (×4): qty 1

## 2024-05-28 MED ORDER — SODIUM CHLORIDE 0.9 % IV SOLN: INTRAVENOUS | Status: AC

## 2024-05-28 MED ORDER — ATORVASTATIN CALCIUM 20 MG PO TABS
20.0000 mg | ORAL_TABLET | Freq: Every day | ORAL | Status: DC
Start: 2024-05-29 — End: 2024-05-30
  Administered 2024-05-29: 20 mg via ORAL
  Filled 2024-05-28 (×2): qty 1

## 2024-05-28 MED ORDER — HYDRALAZINE HCL 20 MG/ML IJ SOLN
10.0000 mg | INTRAMUSCULAR | Status: DC | PRN
  Administered 2024-05-28 – 2024-05-30 (×5): 10 mg via INTRAVENOUS
  Filled 2024-05-28 (×5): qty 1

## 2024-05-28 MED ORDER — SODIUM CHLORIDE 0.9 % IV BOLUS
500.0000 mL | Freq: Once | INTRAVENOUS | Status: AC
  Administered 2024-05-28: 500 mL via INTRAVENOUS

## 2024-05-28 MED ORDER — PANTOPRAZOLE SODIUM 40 MG PO TBEC
40.0000 mg | DELAYED_RELEASE_TABLET | Freq: Every day | ORAL | Status: DC
Start: 2024-05-29 — End: 2024-05-30
  Administered 2024-05-29 – 2024-05-30 (×2): 40 mg via ORAL
  Filled 2024-05-28 (×2): qty 1

## 2024-05-28 NOTE — ED Notes (Signed)
 Pt placed on bedpan at this time.

## 2024-05-28 NOTE — ED Provider Notes (Signed)
 Care of this patient assumed from prior physician at 1500 pending MRI, repeat lactate, disposition. Please see prior physician note for further details.  Briefly, this is a 63 year old female who presents after a fall 2 days ago with left hip pain.  Has been ambulatory but using a cane.  Went to [facility] where she had an x-Makeisha Jentsch performed that was concerning for possible hip fracture, directed to the ER.  Here, x-Gaither Biehn equivocal for fracture.  MRI of the hip ordered by prior physician.  This was pending at time of signout.  She also had labs performed notable for an anion gap metabolic acidosis.  She had additional labs performed demonstrating lactic acidosis at 3.2.  She was ordered for IV fluids.  Signed out to me pending repeat lactate.  MRI resulted without evidence of acute fracture, degenerative changes noted.  Her repeat lactate remained elevated at 3, slightly downtrending.  She was given additional IV fluids and repeat lactate was obtained.  This unfortunately up trended to 3.2.  Given her persistent lactic acidosis with unclear exact etiology, did discuss admission for further evaluation.  Also noted to be significantly hypertensive.  Discussed with Dr. Hilma with hospitalist team.  He will evaluate for anticipated admission.   Levander Slate, MD 05/28/24 [phone]

## 2024-05-28 NOTE — ED Notes (Signed)
 Sending urine sample to lab with a temp sunquest label

## 2024-05-28 NOTE — H&P (Signed)
 History and Physical    MARK BENECKE FMW:[id number] DOB: 1960/11/17 DOA: 05/28/2024  Referring MD/NP/PA:   PCP: Sowles, Krichna, MD   Patient coming from:  The patient is coming from home.     Chief Complaint: fall and left hip pain  HPI: [name] is a 63 y.o. female with medical history significant of HTN, HLD, COPD, GERD, depression, PUD, lumbago, IBS, anemia, metabolic syndrome, chronic pain, TIA, who presents with fall and left hip pain.   Pt states that she slipped on the wet floor in the store and fell to days ago.  She injured her left hip, left shoulder, left ankle, causing pain in these locations.  Her left hip pain is constant, sharp, severe, nonradiating, aggravated by movement.  Patient does not have chest pain, cough, SOB.  No nausea, vomiting, diarrhea.  She states that she had some abdominal discomfort earlier, which has resolved.  Currently no active abdominal pain.  No symptoms of UTI.  No fever or chills.  Patient states that she was seen by urgent Ortho and had x-rays done which was concerning for possible hip fracture. She was directed to the ER.    Data reviewed independently and ED Course: pt was found to have WBC 11.2, GFR> 60, bicarbonate 20, anion gap 17, lactic acid 3.2 --> 3.0 --> 3.2, CK 54.  Temperature normal, blood pressure 215/93, heart rate of 101, RR 20, oxygen saturation 100% on room air.  CT of head negative for acute intracranial abnormalities.  X-ray of left shoulder, left ankle, pelvis are negative for acute bony fracture.  MRI of left hip negative for bony fracture or AVN, but showed degenerative disease in bilateral hip joints.  Patient is placed in the PCU for observation.  X-ray of Left femur : Cortical regularity laterally in the femoral neck region. This could be related to degenerative spurring or subtle nondisplaced fracture. This could be further evaluated with CT if felt clinically indicated.   MRI-left hip: 1. No hip fracture or  AVN. 2. Mild/moderate bilateral hip joint degenerative changes. 3. Bilateral peritrochanteric tendinosis without bursitis. 4. Bilateral proximal hamstring tendinopathy with small partial-thickness tears of the attachment fibers. 5. Pubic symphysis and SI joint degenerative changes but no pelvic fractures.   EKG:  Not done in ED, will get one.     Review of Systems:   General: no fevers, chills, no body weight gain, has fatigue HEENT: no blurry vision, hearing changes or sore throat Respiratory: no dyspnea, coughing, wheezing CV: no chest pain, no palpitations GI: no nausea, vomiting, abdominal pain, diarrhea, constipation GU: no dysuria, burning on urination, increased urinary frequency, hematuria  Ext: no leg edema Neuro: no unilateral weakness, numbness, or tingling, no vision change or hearing loss. Has fall. Skin: no rash, no skin tear. MSK: has pain left shoulder, hip, ankle Heme: No easy bruising.  Travel history: No recent long distant travel.   Allergy: No Known Allergies  Past Medical History:  Diagnosis Date   Abdominal aortic atherosclerosis (HCC)    Anemia    Arthritis    bilateral knees   Bronchitis    Carpal tunnel syndrome, bilateral    CHF (congestive heart failure) (HCC)    Contact dermatitis and other eczema, due to unspecified cause    COPD (chronic obstructive pulmonary disease) (HCC)    DDD (degenerative disc disease)    Depressive disorder    Diverticulosis    Dysmetabolic syndrome X    Eczema    GERD (  gastroesophageal reflux disease)    Hyperlipidemia    Hypertension    IBS (irritable bowel syndrome)    Insomnia    Lumbago    Nonspecific abnormal electrocardiogram (ECG) (EKG)    Osteoarthritis of both knees    Other ovarian failure(256.39)    Peptic ulcer    Postmenopausal atrophic vaginitis    Stroke (HCC)    2023, no deficits, not on thinners   Symptomatic menopausal or female climacteric states    Unspecified vitamin D  deficiency      Past Surgical History:  Procedure Laterality Date   ACHILLES TENDON SURGERY Left 01/02/2022   Procedure: ACHILLES TENDON REPAIR-SECONDARY;  Surgeon: Ashley Soulier, DPM;  Location: [facility];  Service: Podiatry;  Laterality: Left;   BACK SURGERY     BREAST EXCISIONAL BIOPSY Left ?   neg   BREAST LUMPECTOMY Left 01/08/2021   Procedure: BREAST LUMPECTOMY;  Surgeon: Lane Shope, MD;  Location: [facility];  Service: General;  Laterality: Left;   CARPAL TUNNEL RELEASE Left 08/15/2018   Procedure: CARPAL TUNNEL RELEASE;  Surgeon: Cleotilde Barrio, MD;  Location: [facility];  Service: Orthopedics;  Laterality: Left;   CERVICAL DISCECTOMY     x 2; metal plate   CHOLECYSTECTOMY     COLONOSCOPY WITH PROPOFOL  N/A 08/08/2018   Procedure: COLONOSCOPY WITH PROPOFOL ;  Surgeon: Janalyn Keene NOVAK, MD;  Location: [facility];  Service: Endoscopy;  Laterality: N/A;   COLONOSCOPY WITH PROPOFOL  N/A 12/01/2021   Procedure: COLONOSCOPY WITH PROPOFOL ;  Surgeon: Unk Corinn Skiff, MD;  Location: [facility];  Service: Gastroenterology;  Laterality: N/A;   DILATATION & CURETTAGE/HYSTEROSCOPY WITH MYOSURE N/A 03/21/2015   Procedure: DILATATION & CURETTAGE/HYSTEROSCOPY;  Surgeon: Bebe Furry, MD;  Location: [facility];  Service: Gynecology;  Laterality: N/A;   DILATION AND CURETTAGE OF UTERUS     ENDOMETRIAL ABLATION     ESOPHAGOGASTRODUODENOSCOPY (EGD) WITH PROPOFOL  N/A 04/13/2018   Procedure: ESOPHAGOGASTRODUODENOSCOPY (EGD) WITH PROPOFOL ;  Surgeon: Janalyn Keene NOVAK, MD;  Location: [facility];  Service: Endoscopy;  Laterality: N/A;   ESOPHAGOGASTRODUODENOSCOPY (EGD) WITH PROPOFOL  N/A 08/08/2018   Procedure: ESOPHAGOGASTRODUODENOSCOPY (EGD) WITH PROPOFOL ;  Surgeon: Janalyn Keene NOVAK, MD;  Location: [facility];  Service: Endoscopy;  Laterality: N/A;   ESOPHAGOGASTRODUODENOSCOPY (EGD) WITH PROPOFOL  N/A 03/25/2022   Procedure: ESOPHAGOGASTRODUODENOSCOPY (EGD) WITH PROPOFOL ;  Surgeon: Unk Corinn Skiff, MD;  Location: [facility];  Service: Gastroenterology;  Laterality: N/A;   ESOPHAGOGASTRODUODENOSCOPY (EGD) WITH PROPOFOL  N/A 03/27/2022   Procedure: ESOPHAGOGASTRODUODENOSCOPY (EGD) WITH PROPOFOL ;  Surgeon: Unk Corinn Skiff, MD;  Location: [facility];  Service: Gastroenterology;  Laterality: N/A;   JOINT REPLACEMENT     OSTECTOMY Left 01/02/2022   Procedure: CALCANEAL EXOSTECTOMY;  Surgeon: Ashley Soulier, DPM;  Location: [facility];  Service: Podiatry;  Laterality: Left;   SPINAL FUSION     lumbar x2   TONSILLECTOMY     TOTAL KNEE ARTHROPLASTY Left 04/12/2019   Procedure: TOTAL KNEE ARTHROPLASTY;  Surgeon: Leora Lynwood SAUNDERS, MD;  Location: [facility];  Service: Orthopedics;  Laterality: Left;   TOTAL KNEE ARTHROPLASTY Right 04/17/2020   Procedure: TOTAL KNEE ARTHROPLASTY;  Surgeon: Leora Lynwood SAUNDERS, MD;  Location: [facility];  Service: Orthopedics;  Laterality: Right;   TUBAL LIGATION      Social History:  reports that she quit smoking about 5 years ago. Her smoking use included cigarettes. She started smoking about 21 years ago. She has a 20 pack-year smoking history. She quit smokeless tobacco use about 2 years ago. She reports current alcohol  use.  She reports that she does not use drugs.  Family History:  Family History  Problem Relation Age of Onset   Heart disease Mother    Thyroid  cancer Mother    Congestive Heart Failure Mother    Hypertension Father    Heart failure Maternal Uncle    Diabetes Maternal Uncle    Kidney disease Maternal Uncle    Breast cancer Maternal Grandmother    Heart failure Maternal Grandfather    Depression Daughter    Asthma Daughter      Prior to Admission medications   Medication Sig Start Date End Date Taking? Authorizing Provider  amitriptyline  (ELAVIL ) 10 MG tablet Take 1 tablet (10 mg total) by mouth at bedtime. 10/19/23   Sowles, Krichna, MD  amLODipine  (NORVASC ) 5 MG tablet Take 1 tablet (5 mg total) by mouth daily. 08/12/23   Gareth Mliss FALCON, FNP  aspirin  EC 81 MG EC tablet Take 1 tablet (81 mg total) by mouth daily. Swallow whole. 02/21/22   Krishnan, Sendil K, MD  atorvastatin  (LIPITOR) 20 MG tablet Take 1 tablet (20 mg total) by mouth daily. 02/02/24   Sowles, Krichna, MD  carvedilol  (COREG ) 6.25 MG tablet TAKE 1 TABLET(6.25 MG) BY MOUTH TWICE DAILY 10/06/23   Darliss Rogue, MD  cholecalciferol  (VITAMIN D ) 1000 units tablet Take 1,000 Units by mouth daily.    [provider]  Cyanocobalamin  (B-12) 1000 MCG SUBL Place 1 each under the tongue 3 (three) times a week.    [provider]  desvenlafaxine  (PRISTIQ ) 50 MG 24 hr tablet Take 1 tablet (50 mg total) by mouth daily. 03/09/24   Sowles, Krichna, MD  gabapentin  (NEURONTIN ) 300 MG capsule Take 300 mg by mouth every 6 (six) hours as needed (Nerve pain).    Clide Boss, MD  losartan -hydrochlorothiazide  (HYZAAR) 100-25 MG tablet Take 1 tablet by mouth daily. 02/02/24   Sowles, Krichna, MD  metFORMIN  (GLUCOPHAGE ) 500 MG tablet Take 1 tablet (500 mg total) by mouth daily with breakfast. 06/04/21   Sowles, Krichna, MD  Multiple Vitamins-Minerals (MULTIVITAMIN WITH MINERALS) tablet Take 1 tablet by mouth every other day.    [provider]  NARCAN 4 MG/0.1ML LIQD nasal spray kit Place 1 spray into the nose once. 03/14/19   Clide Boss, MD  Oxycodone  HCl 20 MG TABS Take 20 mg by mouth every 4 (four) hours as needed (pain). 07/16/20   [facility]  pantoprazole  (PROTONIX ) 40 MG tablet Take 1 tablet (40 mg total) by mouth daily. 02/02/24   Sowles, Krichna, MD  QUEtiapine  (SEROQUEL ) 25 MG tablet Take 1 tablet (25 mg total) by mouth at bedtime. 02/02/24   Sowles, Krichna, MD  spironolactone  (ALDACTONE ) 25 MG tablet TAKE 1/2 TABLET(12.5 MG) BY MOUTH DAILY 02/10/23   Furth, Cadence H, PA-C  tiZANidine  (ZANAFLEX ) 4 MG tablet Take 4 mg by mouth every 8 (eight) hours. 02/13/22   [facility]  valACYclovir  (VALTREX ) 1000 MG  tablet TAKE 1 TABLET(1000 MG) BY MOUTH TWICE DAILY AS NEEDED 02/02/24   Sowles, Krichna, MD    Physical Exam: Vitals:   05/28/24 2345 05/28/24 2356 05/28/24 2357 05/29/24 0054  BP:  (!) 181/89  (!) 164/72  Pulse: 85   96  Resp: 15   20  Temp:   98.2 F (36.8 C) 98.2 F (36.8 C)  TempSrc:   Oral Oral  SpO2: 100%   98%  Weight:      Height:       General: Not  in acute distress HEENT:       Eyes: PERRL, EOMI, no jaundice       ENT: No discharge from the ears and nose, no pharynx injection, no tonsillar enlargement.        Neck: No JVD, no bruit, no mass felt. Heme: No neck lymph node enlargement. Cardiac: S1/S2, RRR, No murmurs, No gallops or rubs. Respiratory: No rales, wheezing, rhonchi or rubs. GI: Soft, nondistended, nontender, no rebound pain, no organomegaly, BS present. GU: No hematuria Ext: No pitting leg edema bilaterally. 1+DP/PT pulse bilaterally. Musculoskeletal: has tenderness in left hip, shoulder, ankle Skin: No rashes.  Neuro: Alert, oriented X3, cranial nerves II-XII grossly intact, moves all extremities. Psych: Patient is not psychotic, no suicidal or hemocidal ideation.  Labs on Admission: I have personally reviewed following labs and imaging studies  CBC: Recent Labs  Lab 05/28/24 1325  WBC 11.2*  NEUTROABS 6.5  HGB 13.6  HCT 42.1  MCV 95.7  PLT 426*   Basic Metabolic Panel: Recent Labs  Lab 05/28/24 1325  NA 141  K 3.8  CL 104  CO2 20*  GLUCOSE 101*  BUN 8  CREATININE 0.69  CALCIUM  10.1   GFR: Estimated Creatinine Clearance: 87 mL/min (by C-G formula based on SCr of 0.69 mg/dL). Liver Function Tests: No results for input(s): AST, ALT, ALKPHOS, BILITOT, PROT, ALBUMIN in the last 168 hours. No results for input(s): LIPASE, AMYLASE in the last 168 hours. No results for input(s): AMMONIA in the last 168 hours. Coagulation Profile: No results for input(s): INR, PROTIME in the last 168 hours. Cardiac Enzymes: Recent  Labs  Lab 05/28/24 1437  CKTOTAL 54   BNP (last 3 results) No results for input(s): PROBNP in the last 8760 hours. HbA1C: No results for input(s): HGBA1C in the last 72 hours. CBG: No results for input(s): GLUCAP in the last 168 hours. Lipid Profile: No results for input(s): CHOL, HDL, LDLCALC, TRIG, CHOLHDL, LDLDIRECT in the last 72 hours. Thyroid  Function Tests: No results for input(s): TSH, T4TOTAL, FREET4, T3FREE, THYROIDAB in the last 72 hours. Anemia Panel: No results for input(s): VITAMINB12, FOLATE, FERRITIN, TIBC, IRON, RETICCTPCT in the last 72 hours. Urine analysis:    Component Value Date/Time   COLORURINE YELLOW (A) 12/08/2023 1721   APPEARANCEUR CLOUDY (A) 12/08/2023 1721   LABSPEC 1.013 12/08/2023 1721   PHURINE 5.0 12/08/2023 1721   GLUCOSEU NEGATIVE 12/08/2023 1721   HGBUR NEGATIVE 12/08/2023 1721   BILIRUBINUR NEGATIVE 12/08/2023 1721   BILIRUBINUR Small 02/05/2022 1002   KETONESUR NEGATIVE 12/08/2023 1721   PROTEINUR NEGATIVE 12/08/2023 1721   UROBILINOGEN 0.2 02/05/2022 1002   NITRITE NEGATIVE 12/08/2023 1721   LEUKOCYTESUR TRACE (A) 12/08/2023 1721   Sepsis Labs: @LABRCNTIP (procalcitonin:4,lacticidven:4) )No results found for this or any previous visit (from the past 240 hours).   Radiological Exams on Admission:   Assessment/Plan Principal Problem:   Hypertensive urgency Active Problems:   Fall at home, initial encounter   Left hip pain   Lactic acidosis   Chronic diastolic CHF (congestive heart failure) (HCC)   COPD, mild (HCC)   History of TIA (transient ischemic attack)   Dyslipidemia   Major depression, recurrent, chronic (HCC)   Chronic pain   Metabolic syndrome   Prediabetes   Obesity (BMI 30-39.9)   Assessment and Plan:   Hypertensive urgency: Initial blood pressure 215/93.  Patient was given labetalol  10 mg x 2, with improvement.  Blood pressure 164/72 now. - Place in telemetry bed for  observation -  Increase amlodipine  dose from 5 to 10 mg daily - Coreg  - change Hyzaar to Cozaar , and hold HCTZ since patient needs IV fluid - IV hydralazine  as needed  Fall at home, initial encounter and Left hip pain: MRI imaging negative for bony fracture, but showed degenerative joint, and bilateral proximal hamstring tendinopathy with small partial-thickness tears of the attachment fibers. -PT/OT - Pain control: As needed morphine , Percocet, Tylenol  - Lidoderm  patch - As needed Robaxin  - PT/OT  Lactic acidosis: Lactic acid 0.2 --> 3.0 --> 3.2.  Etiology is not clear.  Patient has a mild leukocytosis with WBC 11.2, but no fever, no signs of infection.  Clinically does not have sepsis.  Possible examination is metformin  use. - Hold metformin  - IV fluid: 500 cc LR, 500 cc normal saline, then 75 cc/h for normal saline - Trend lactic acid level  Chronic diastolic CHF (congestive heart failure) (HCC): 2D echo on 02/19/2022 showed EF of 60 to 65% with grade 2 diastolic dysfunction.  Patient does not have leg edema or JVD.  CHF seem to be compensated. -Check BNP - Hold spironolactone  and HCTZ since patient need IV fluid  COPD, mild (HCC): Stable - Bronchodilators and as needed Mucinex   History of TIA (transient ischemic attack) -Lipitor, aspirin   Dyslipidemia -Lipitor  Major depression, recurrent, chronic (HCC) -Continue home medications  Chronic pain - On multiple pain medications for left hip pain as above  Metabolic syndrome -Hold metformin  due to lactic acidosis  Prediabetes: Recent A1c 6.5, well-controlled. -Hold metformin   Obesity (BMI 30-39.9): Patient has Obesity Class I, with body weight 90.7 Kg and BMI 30.41 kg/m2.  - Encourage losing weight - Exercise and healthy diet          DVT ppx: SQ Lovenox   Code Status: Full code   Family Communication:  Yes, patient's husband   at bed side.   Disposition Plan:  Anticipate discharge back to previous  environment  Consults called:  none  Admission status and Level of care: Telemetry Medical:    for obs    Dispo: The patient is from: Home              Anticipated d/c is to: Home              Anticipated d/c date is: 1 day              Patient currently is not medically stable to d/c.    Severity of Illness:  The appropriate patient status for this patient is OBSERVATION. Observation status is judged to be reasonable and necessary in order to provide the required intensity of service to ensure the patient's safety. The patient's presenting symptoms, physical exam findings, and initial radiographic and laboratory data in the context of their medical condition is felt to place them at decreased risk for further clinical deterioration. Furthermore, it is anticipated that the patient will be medically stable for discharge from the hospital within 2 midnights of admission.        Date of Service 05/29/2024    Caleb Exon [facility]   If 7PM-7AM, please contact night-coverage [email or web address] 05/29/2024, 2:19 AM

## 2024-05-28 NOTE — ED Notes (Signed)
 MD aware of PT's elevated BP and current pain level. MD verbalized clearance for patient to go to MRI at this time.

## 2024-05-28 NOTE — ED Notes (Signed)
 This NT placed pt on the bedpan after doctor approached this NT stating that the pt need to use the BR.

## 2024-05-28 NOTE — ED Notes (Signed)
 Called CCMD  spoke with Joshua to put pt on continuous monitoring.

## 2024-05-28 NOTE — ED Triage Notes (Signed)
 Pt to ED from [facility] for L broken hip. Pt sitting in wheelchair with both hips flexed. States they told her shem may need surgery. Injury was from mechanical fall at grocery store 2 days ago. Pt states she has been up and walking. Not on thinners.

## 2024-05-28 NOTE — ED Provider Notes (Signed)
 [facility] Provider Note    Event Date/Time   First MD Initiated Contact with Patient 05/28/24 1331     (approximate)   History   Hip Injury and possible fx   HPI  [name] is a 63 y.o. female has a history of anemia congestive heart failure COPD depression reports previous knee replacements.  Patient 2 days ago was at the grocery store she reports that she slipped.  She fell onto her left side striking her left hip on the ground left shoulder and has had a slight headache since that time but does not explicitly remember hitting her head.  She reports that she believes she slipped on something that was wet on the floor  She advises that for the last 2 days she has been having use a cane due to pain in her left hip area slight soreness in the left ankle but still able to ambulate and a bit of soreness in the left shoulder.  She went to [facility] to be seen and was told that she had an x-ray that was suspicious for a hip fracture and was referred to the ER for evaluation of left hip pain  No other recent illness no fevers no chills she is otherwise been feeling in her normal health.  She did not have much to eat yet today as she reports she went to urgent care     Physical Exam   Triage Vital Signs: ED Triage Vitals  Encounter Vitals Group     BP 05/28/24 1324 (!) 174/96     Girls Systolic BP Percentile --      Girls Diastolic BP Percentile --      Boys Systolic BP Percentile --      Boys Diastolic BP Percentile --      Pulse Rate 05/28/24 1324 (!) 101     Resp 05/28/24 1324 20     Temp 05/28/24 1324 98.9 F (37.2 C)     Temp Source 05/28/24 1324 Oral     SpO2 05/28/24 1324 98 %     Weight 05/28/24 1322 200 lb (90.7 kg)     Height 05/28/24 1322 5' 8 (1.727 m)     Head Circumference --      Peak Flow --      Pain Score 05/28/24 1319 8     Pain Loc --      Pain Education --      Exclude from Growth Chart --     Most recent vital  signs: Vitals:   05/28/24 1450 05/28/24 1500  BP: (!) 184/94 (!) 177/95  Pulse: 92 91  Resp: 18 18  Temp:    SpO2: 100% 98%     General: Awake, no distress.  Very pleasant CV:  Good peripheral perfusion.  Normal tones and rate.  Palpable dorsalis pedis pulses good use of the left lower extremity. Resp:  Normal effort.  Clear bilateral Abd:  No distention.  Soft nontender nondistendedOther:    Patient moves both shoulders arms elbows hands major joints the upper extremities freely.  She reports slight soreness to palpation over the left anterior shoulder but there is no bruising or contusion and demonstrates normal range of motion and strength  She also reports soreness across the left ankle joint but there is no obvious edema or swelling she is able to range the left ankle plantar dorsiflex and reports normal sensation except for a tingling discomfort over the left great toe but  reports that that is chronic.  Able to flex and extend at the left hip but reports it is sore through range of motion reports mild tenderness to palpation of the left lateral trochanteric region but no obvious deformity shortening or rotation.  No tenderness over the knee or remainder of the distal femur or proximal tib-fib    ED Results / Procedures / Treatments   Labs (all labs ordered are listed, but only abnormal results are displayed) Labs Reviewed  CBC WITH DIFFERENTIAL/PLATELET - Abnormal; Notable for the following components:      Result Value   WBC 11.2 (*)    Platelets 426 (*)    All other components within normal limits  BASIC METABOLIC PANEL WITH GFR - Abnormal; Notable for the following components:   CO2 20 (*)    Glucose, Bld 101 (*)    Anion gap 17 (*)    All other components within normal limits  LACTIC ACID, PLASMA - Abnormal; Notable for the following components:   Lactic Acid, Venous 3.2 (*)    All other components within normal limits  SALICYLATE LEVEL - Abnormal; Notable for the  following components:   Salicylate Lvl <7.0 (*)    All other components within normal limits  CK  BETA-HYDROXYBUTYRIC ACID  LACTIC ACID, PLASMA  TYPE AND SCREEN   Labs slightly unexpectedly remarkable for reduced CO2 and elevated anion gap.  Labs were initially drawn as potential concern for need for left hip operation her clinical symptoms do not seem to match that of acute lab abnormality.  Nonetheless lactic acid does demonstrate slight elevation, based on her symptoms and not have anything eat or drink question if this could be slightly related to mild dehydration and early metabolic cause.  Denies any fevers chills recent illnesses aside from mechanical fall.  CK normal  EKG     RADIOLOGY  DG Ankle Complete Left Result Date: 05/28/2024 CLINICAL DATA:  Fall and trauma to the left ankle. EXAM: LEFT ANKLE COMPLETE - 3+ VIEW COMPARISON:  None Available. FINDINGS: No acute fracture or dislocation. The bones are osteopenic. The ankle mortise is intact. The soft tissues are unremarkable. IMPRESSION: 1. No acute fracture or dislocation. 2. Osteopenia. Electronically Signed   By: Vanetta Chou M.D.   On: 05/28/2024 15:23   DG FEMUR MIN 2 VIEWS LEFT Result Date: 05/28/2024 CLINICAL DATA:  Fall, pain EXAM: LEFT FEMUR 2 VIEWS COMPARISON:  04/12/2019 FINDINGS: Changes of left knee replacement. No hardware complicating feature or joint effusion. Mild degenerative changes in the left hip with joint space narrowing and spurring. There is cortical irregularity noted laterally in the femoral neck region which could be related to spurring although subtle nondisplaced fracture is difficult to exclude. No subluxation or dislocation. IMPRESSION: Cortical regularity laterally in the femoral neck region. This could be related to degenerative spurring or subtle nondisplaced fracture. This could be further evaluated with CT if felt clinically indicated. Electronically Signed   By: Franky Crease M.D.   On:  05/28/2024 15:15   DG Shoulder Left Result Date: 05/28/2024 CLINICAL DATA:  Fall EXAM: LEFT SHOULDER - 2+ VIEW COMPARISON:  None Available. FINDINGS: There is no evidence of fracture or dislocation. There is moderate glenohumeral joint space narrowing. Soft tissues are unremarkable. IMPRESSION: 1. No fracture or dislocation. 2. Moderate glenohumeral joint space narrowing. Electronically Signed   By: Greig Pique M.D.   On: 05/28/2024 15:13   DG Pelvis 1-2 Views Result Date: 05/28/2024 CLINICAL DATA:  Fall EXAM:  PELVIS - 1-2 VIEW COMPARISON:  None Available. FINDINGS: There is no evidence of pelvic fracture or diastasis. No pelvic bone lesions are seen. There are mild degenerative changes of both hips. IMPRESSION: No acute fracture or dislocation. Mild degenerative changes of both hips. Electronically Signed   By: Greig Pique M.D.   On: 05/28/2024 15:12   CT Head Wo Contrast Result Date: 05/28/2024 CLINICAL DATA:  Facial trauma, blunt.  Mechanical fall 2 days ago. EXAM: CT HEAD WITHOUT CONTRAST TECHNIQUE: Contiguous axial images were obtained from the base of the skull through the vertex without intravenous contrast. RADIATION DOSE REDUCTION: This exam was performed according to the departmental dose-optimization program which includes automated exposure control, adjustment of the mA and/or kV according to patient size and/or use of iterative reconstruction technique. COMPARISON:  Head CT and MRI 12/08/2023 FINDINGS: Brain: There is no evidence of an acute infarct, intracranial hemorrhage, mass, midline shift, or extra-axial fluid collection. Cerebral volume is normal. The ventricles are normal in size. Cerebral white matter hypodensities are nonspecific but compatible with minimal chronic small vessel ischemic disease. Vascular: No hyperdense vessel. Skull: No fracture or suspicious lesion. Sinuses/Orbits: Visualized paranasal sinuses and mastoid air cells are clear. Unremarkable orbits. Other: None.  IMPRESSION: No evidence of acute intracranial abnormality. Electronically Signed   By: Dasie Hamburg M.D.   On: 05/28/2024 14:49      PROCEDURES:  Critical Care performed: No  Procedures   MEDICATIONS ORDERED IN ED: Medications  morphine  (PF) 4 MG/ML injection 4 mg (4 mg Intravenous Given 05/28/24 1408)  sodium chloride  0.9 % bolus 500 mL (500 mLs Intravenous New Bag/Given 05/28/24 1438)  morphine  (PF) 4 MG/ML injection 4 mg (4 mg Intravenous Given 05/28/24 1559)     IMPRESSION / MDM / ASSESSMENT AND PLAN / ED COURSE  I reviewed the triage vital signs and the nursing notes.                              Differential diagnosis includes, but is not limited to, hip injury likely contusion of the left shoulder left ankle based on the clinical exam doubt fracture in those areas however the left hip concerning for potential subtle nondisplaced fracture.  Somewhat reassuring that she has been able to ambulate with use of a cane but soreness focal in that left hip and x-ray abnormality questionable for fracture.  Patient agreeable does not have any pacemaker or implanted medical device outside of previous knee replacements, will proceed with MRI of the left hip to exclude occult fracture.  Additionally lab abnormalities as noted above, will hydrate and recheck lactic acid but really no other clinical symptoms to correlate with the finding that would be concerning for elevated lactic acid in the setting of which she describes a mechanical fall with no associated systemic symptoms.  Patient's presentation is most consistent with acute complicated illness / injury requiring diagnostic workup.      Clinical Course as of 05/28/24 1609  Sun May 28, 2024  1411 Patient asymptomatic to any clinical history that I have noted or concern that would suggest acute acidosis but she does have otherwise an unexplained anion gap metabolic acidosis.  Will check CK, lactic, beta hydroxy.  [MQ]    Clinical  Course User Index [MQ] Dicky Anes, MD   Ongoing care assigned to Dr. Levander, follow-up on repeat lactic acid as well as MRI of the left hip.  Reassessment of patient condition,  if left hip fracture patient will clearly need admission  FINAL CLINICAL IMPRESSION(S) / ED DIAGNOSES   Final diagnoses:  Left hip pain  Contusion, multiple sites  Elevated lactic acid level     Rx / DC Orders   ED Discharge Orders     None        Note:  This document was prepared using Dragon voice recognition software and may include unintentional dictation errors.   Dicky Anes, MD 05/28/24 [phone]

## 2024-05-29 DIAGNOSIS — Z833 Family history of diabetes mellitus: Secondary | ICD-10-CM | POA: Diagnosis not present

## 2024-05-29 DIAGNOSIS — E860 Dehydration: Secondary | ICD-10-CM | POA: Diagnosis present

## 2024-05-29 DIAGNOSIS — D72829 Elevated white blood cell count, unspecified: Secondary | ICD-10-CM | POA: Diagnosis present

## 2024-05-29 DIAGNOSIS — Z8673 Personal history of transient ischemic attack (TIA), and cerebral infarction without residual deficits: Secondary | ICD-10-CM | POA: Diagnosis not present

## 2024-05-29 DIAGNOSIS — Z6830 Body mass index (BMI) 30.0-30.9, adult: Secondary | ICD-10-CM | POA: Diagnosis not present

## 2024-05-29 DIAGNOSIS — Z96653 Presence of artificial knee joint, bilateral: Secondary | ICD-10-CM | POA: Diagnosis present

## 2024-05-29 DIAGNOSIS — Z87891 Personal history of nicotine dependence: Secondary | ICD-10-CM | POA: Diagnosis not present

## 2024-05-29 DIAGNOSIS — Z7982 Long term (current) use of aspirin: Secondary | ICD-10-CM | POA: Diagnosis not present

## 2024-05-29 DIAGNOSIS — W19XXXA Unspecified fall, initial encounter: Secondary | ICD-10-CM | POA: Diagnosis not present

## 2024-05-29 DIAGNOSIS — Z981 Arthrodesis status: Secondary | ICD-10-CM | POA: Diagnosis not present

## 2024-05-29 DIAGNOSIS — M7062 Trochanteric bursitis, left hip: Secondary | ICD-10-CM | POA: Diagnosis present

## 2024-05-29 DIAGNOSIS — F339 Major depressive disorder, recurrent, unspecified: Secondary | ICD-10-CM | POA: Diagnosis present

## 2024-05-29 DIAGNOSIS — S76012A Strain of muscle, fascia and tendon of left hip, initial encounter: Secondary | ICD-10-CM | POA: Diagnosis present

## 2024-05-29 DIAGNOSIS — W010XXA Fall on same level from slipping, tripping and stumbling without subsequent striking against object, initial encounter: Secondary | ICD-10-CM | POA: Diagnosis present

## 2024-05-29 DIAGNOSIS — J449 Chronic obstructive pulmonary disease, unspecified: Secondary | ICD-10-CM | POA: Diagnosis present

## 2024-05-29 DIAGNOSIS — E66811 Obesity, class 1: Secondary | ICD-10-CM | POA: Diagnosis present

## 2024-05-29 DIAGNOSIS — E785 Hyperlipidemia, unspecified: Secondary | ICD-10-CM | POA: Diagnosis present

## 2024-05-29 DIAGNOSIS — Z9049 Acquired absence of other specified parts of digestive tract: Secondary | ICD-10-CM | POA: Diagnosis not present

## 2024-05-29 DIAGNOSIS — I160 Hypertensive urgency: Secondary | ICD-10-CM | POA: Diagnosis present

## 2024-05-29 DIAGNOSIS — Z7984 Long term (current) use of oral hypoglycemic drugs: Secondary | ICD-10-CM | POA: Diagnosis not present

## 2024-05-29 DIAGNOSIS — T07XXXA Unspecified multiple injuries, initial encounter: Secondary | ICD-10-CM | POA: Diagnosis present

## 2024-05-29 DIAGNOSIS — I5032 Chronic diastolic (congestive) heart failure: Secondary | ICD-10-CM | POA: Diagnosis present

## 2024-05-29 DIAGNOSIS — R7303 Prediabetes: Secondary | ICD-10-CM | POA: Diagnosis present

## 2024-05-29 DIAGNOSIS — Z8249 Family history of ischemic heart disease and other diseases of the circulatory system: Secondary | ICD-10-CM | POA: Diagnosis not present

## 2024-05-29 DIAGNOSIS — G894 Chronic pain syndrome: Secondary | ICD-10-CM | POA: Diagnosis present

## 2024-05-29 DIAGNOSIS — M25552 Pain in left hip: Secondary | ICD-10-CM

## 2024-05-29 DIAGNOSIS — Y92009 Unspecified place in unspecified non-institutional (private) residence as the place of occurrence of the external cause: Secondary | ICD-10-CM | POA: Diagnosis not present

## 2024-05-29 DIAGNOSIS — I110 Hypertensive heart disease with heart failure: Secondary | ICD-10-CM | POA: Diagnosis present

## 2024-05-29 DIAGNOSIS — E872 Acidosis, unspecified: Secondary | ICD-10-CM | POA: Diagnosis present

## 2024-05-29 LAB — CBC
HCT: 38.4 % (ref 36.0–46.0)
Hemoglobin: 12.8 g/dL (ref 12.0–15.0)
MCH: 31.4 pg (ref 26.0–34.0)
MCHC: 33.3 g/dL (ref 30.0–36.0)
MCV: 94.3 fL (ref 80.0–100.0)
Platelets: 357 K/uL (ref 150–400)
RBC: 4.07 MIL/uL (ref 3.87–5.11)
RDW: 12.4 % (ref 11.5–15.5)
WBC: 11.5 K/uL — ABNORMAL HIGH (ref 4.0–10.5)
nRBC: 0 % (ref 0.0–0.2)

## 2024-05-29 LAB — BASIC METABOLIC PANEL WITH GFR
Anion gap: 14 (ref 5–15)
BUN: 8 mg/dL (ref 8–23)
CO2: 23 mmol/L (ref 22–32)
Calcium: 9.4 mg/dL (ref 8.9–10.3)
Chloride: 104 mmol/L (ref 98–111)
Creatinine, Ser: 0.56 mg/dL (ref 0.44–1.00)
GFR, Estimated: >60 mL/min (ref >60)
Glucose, Bld: 135 mg/dL — ABNORMAL HIGH (ref 70–99)
Potassium: 3.2 mmol/L — ABNORMAL LOW (ref 3.5–5.1)
Sodium: 141 mmol/L (ref 135–145)

## 2024-05-29 LAB — LACTIC ACID, PLASMA
Lactic Acid, Venous: 2.6 mmol/L (ref 0.5–1.9)
Lactic Acid, Venous: 2.7 mmol/L (ref 0.5–1.9)
Lactic Acid, Venous: 1.9 mmol/L (ref 0.5–1.9)

## 2024-05-29 LAB — BRAIN NATRIURETIC PEPTIDE: B Natriuretic Peptide: 44.8 pg/mL (ref 0.0–100.0)

## 2024-05-29 LAB — HIV ANTIBODY (ROUTINE TESTING W REFLEX): HIV Screen 4th Generation wRfx: NONREACTIVE

## 2024-05-29 MED ORDER — POTASSIUM CHLORIDE 20 MEQ PO PACK
40.0000 meq | PACK | Freq: Two times a day (BID) | ORAL | Status: AC
  Administered 2024-05-29 (×2): 40 meq via ORAL
  Filled 2024-05-29 (×2): qty 2

## 2024-05-29 MED ORDER — KETOROLAC TROMETHAMINE 15 MG/ML IJ SOLN
15.0000 mg | Freq: Four times a day (QID) | INTRAMUSCULAR | Status: DC
  Administered 2024-05-29 – 2024-05-30 (×4): 15 mg via INTRAVENOUS
  Filled 2024-05-29 (×3): qty 1

## 2024-05-29 NOTE — Evaluation (Signed)
 Physical Therapy Evaluation Patient Details Name: [name] MRN: [id number] DOB: May 10, 1961 Today's Date: 05/29/2024  History of Present Illness  Pt is a 63 y.o. female who presents with fall and left hip pain. MRI imaging negative for bony fracture, but showed degenerative joint, and bilateral proximal hamstring tendinopathy with small partial-thickness tears of the attachment fibers. PMH of HTN, HLD, COPD, GERD, depression, PUD, lumbago, IBS, anemia, metabolic syndrome, chronic pain, TIA.  Clinical Impression  Patient resting in bed upon arrival to session; alert and oriented, follows commands and agreeable to participation with session (with min encouragement).  Rates pain in L side/all over at 7/10; meds administered per RN just prior to session.  Bilat UE/LE strength and ROM grossly symmetrical and WFL; no focal weakness appreciated. Able to complete bed mobility with indep; sit/stand, basic transfers and gait (200') with IV pole/no assist, cga/close sup.  Completes distance with initially pushing IV pole, fading to no assist device throughout distance; mildly antalgic due to pain in L LE, slow and slightly guarded, but no overt buckling, LOB or safety concerns. Would benefit from skilled PT to address above deficits and promote optimal return to PLOF.; recommend post-acute PT follow up as indicated by interdisciplinary care team.         If plan is discharge home, recommend the following: A little help with walking and/or transfers   Can travel by private vehicle        Equipment Recommendations    Recommendations for Other Services       Functional Status Assessment Patient has had a recent decline in their functional status and demonstrates the ability to make significant improvements in function in a reasonable and predictable amount of time.     Precautions / Restrictions Precautions Precautions: None Recall of Precautions/Restrictions: Intact Restrictions Weight  Bearing Restrictions Per Provider Order: No      Mobility  Bed Mobility Overal bed mobility: Modified Independent                  Transfers Overall transfer level: Modified independent Equipment used: None                    Ambulation/Gait Ambulation/Gait assistance: Contact guard assist, Supervision Gait Distance (Feet): 200 Feet Assistive device: IV Pole, None         General Gait Details: initially pushing IV pole, fading to no assist device throughout distance; mildly antalgic due to pain in L LE, slow and slightly guarded, but no overt buckling, LOB or safety concerns.  Stairs            Wheelchair Mobility     Tilt Bed    Modified Rankin (Stroke Patients Only)       Balance Overall balance assessment: Needs assistance Sitting-balance support: No upper extremity supported, Feet supported Sitting balance-Leahy Scale: Normal     Standing balance support: No upper extremity supported Standing balance-Leahy Scale: Fair                               Pertinent Vitals/Pain Pain Assessment Pain Assessment: 0-10 Pain Score: 7  Pain Location: all over, L side Pain Descriptors / Indicators: Sore Pain Intervention(s): Limited activity within patient's tolerance, Monitored during session, Premedicated before session, Repositioned    Home Living Family/patient expects to be discharged to:: Private residence Living Arrangements: Spouse/significant other Available Help at Discharge: Family;Available 24 hours/day Type of Home: House Home Access:  Stairs to enter Entrance Stairs-Rails: Right;Left;Can reach both Entrance Stairs-Number of Steps: 4   Home Layout: Two level;Able to live on main level with bedroom/bathroom Home Equipment: Grab bars - tub/shower;BSC/3in1;Rolling Walker (2 wheels);Cane - single point;Crutches      Prior Function Prior Level of Function : Independent/Modified Independent;Driving              Mobility Comments: IND, one fall leading to hospitalization; could walk through grocery store pushing cart ADLs Comments: IND with ADLs, IADLs     Extremity/Trunk Assessment   Upper Extremity Assessment Upper Extremity Assessment: Overall WFL for tasks assessed    Lower Extremity Assessment Lower Extremity Assessment: Overall WFL for tasks assessed (grossly at least 4 to 4+/5 throughout)       Communication   Communication Communication: No apparent difficulties    Cognition Arousal: Alert Behavior During Therapy: WFL for tasks assessed/performed                             Following commands: Intact       Cueing Cueing Techniques: Verbal cues     General Comments      Exercises     Assessment/Plan    PT Assessment Patient needs continued PT services  PT Problem List Decreased activity tolerance;Decreased balance;Decreased knowledge of precautions;Decreased mobility;Decreased knowledge of use of DME;Decreased safety awareness;Pain       PT Treatment Interventions DME instruction;Gait training;Stair training;Patient/family education;Functional mobility training;Therapeutic activities;Therapeutic exercise;Balance training;Cognitive remediation    PT Goals (Current goals can be found in the Care Plan section)  Acute Rehab PT Goals Patient Stated Goal: to get back home PT Goal Formulation: With patient Time For Goal Achievement: 06/12/24 Potential to Achieve Goals: Good    Frequency Min 1X/week     Co-evaluation               AM-PAC PT 6 Clicks Mobility  Outcome Measure Help needed turning from your back to your side while in a flat bed without using bedrails?: None Help needed moving from lying on your back to sitting on the side of a flat bed without using bedrails?: None Help needed moving to and from a bed to a chair (including a wheelchair)?: A Little Help needed standing up from a chair using your arms (e.g., wheelchair or bedside  chair)?: A Little Help needed to walk in hospital room?: A Little Help needed climbing 3-5 steps with a railing? : A Little 6 Click Score: 20    End of Session   Activity Tolerance: Patient tolerated treatment well Patient left: in bed;with call bell/phone within reach;with bed alarm set   PT Visit Diagnosis: Difficulty in walking, not elsewhere classified (R26.2);Pain Pain - Right/Left: Left Pain - part of body: Hip    Time: 8382-8369 PT Time Calculation (min) (ACUTE ONLY): 13 min   Charges:   PT Evaluation $PT Eval Low Complexity: 1 Low   PT General Charges $$ ACUTE PT VISIT: 1 Visit        Alleigh Mollica H. Delores, PT, DPT, NCS 05/29/24, 5:20 PM [phone]

## 2024-05-29 NOTE — Plan of Care (Signed)

## 2024-05-29 NOTE — Plan of Care (Signed)
  Problem: Clinical Measurements: Goal: Will remain free from infection Outcome: Progressing   Problem: Nutrition: Goal: Adequate nutrition will be maintained Outcome: Progressing   Problem: Pain Managment: Goal: General experience of comfort will improve and/or be controlled Outcome: Progressing

## 2024-05-29 NOTE — Evaluation (Signed)
 Occupational Therapy Evaluation Patient Details Name: [name] MRN: [id number] DOB: January 13, 1961 Today's Date: 05/29/2024   History of Present Illness   Pt is a 63 y.o. female who presents with fall and left hip pain. MRI imaging negative for bony fracture, but showed degenerative joint, and bilateral proximal hamstring tendinopathy with small partial-thickness tears of the attachment fibers. PMH of HTN, HLD, COPD, GERD, depression, PUD, lumbago, IBS, anemia, metabolic syndrome, chronic pain, TIA.     Clinical Impressions Pt was seen for OT evaluation this date. PTA, pt resides in a 2 level home with her spouse with 4 STE and bil HR. Reports IND at baseline with ADLs, IADLs, and mobility both household and limited community distances, e.g. pushing grocery cart through grocery store. Pt does drive. Reports this is her only fall in the last 6 months. Her bedroom/bathroom are on the main level of the home with washer/dryer upstairs. Pt's spouse recently retired and is available 24/7 to assist. Pt is currently functional near her baseline excluding needing increased time and simple task modification d/t all over soreness after her fall. She demo bed mobility MOD I, STS with supervision and ambulation within the room ~25 ft pushing IV pole with supervision and without IV pole with SBA. She demo all aspects of toileting with SUP. Standing sink bath performed with supervision, with short seated rest break. No LOB throughout session and pt declines need for further OT services or follow up OT services.      If plan is discharge home, recommend the following:   Help with stairs or ramp for entrance     Functional Status Assessment   Patient has not had a recent decline in their functional status     Equipment Recommendations         Recommendations for Other Services         Precautions/Restrictions   Precautions Precautions: None Recall of Precautions/Restrictions:  Intact Restrictions Weight Bearing Restrictions Per Provider Order: No     Mobility Bed Mobility Overal bed mobility: Modified Independent                  Transfers Overall transfer level: Modified independent Equipment used: None               General transfer comment: initially pushed IV pole to get to the bathroom, was removed to perform bathing/dressing therefore ambulated back to bed without AD with supervision/SBA      Balance Overall balance assessment: Mild deficits observed, not formally tested                                         ADL either performed or assessed with clinical judgement   ADL Overall ADL's : Needs assistance/impaired     Grooming: Wash/dry hands;Wash/dry face;Oral care;Standing;Applying deodorant;Supervision/safety Grooming Details (indicate cue type and reason): standing at sink in bathroom Upper Body Bathing: Supervision/ safety;Standing Upper Body Bathing Details (indicate cue type and reason): sink bathing in bathroom Lower Body Bathing: Supervison/ safety;Sit to/from stand Lower Body Bathing Details (indicate cue type and reason): standing to bathe peri-area in standing at sink counter         Toilet Transfer: Supervision/safety   Toileting- Clothing Manipulation and Hygiene: Supervision/safety       Functional mobility during ADLs: Supervision/safety       Vision         Perception  Praxis         Pertinent Vitals/Pain Pain Assessment Pain Assessment: 0-10 Pain Score: 9  Pain Location: all over Pain Descriptors / Indicators: Sore Pain Intervention(s): Monitored during session, Premedicated before session, Limited activity within patient's tolerance, Repositioned     Extremity/Trunk Assessment Upper Extremity Assessment Upper Extremity Assessment: Overall WFL for tasks assessed   Lower Extremity Assessment Lower Extremity Assessment: Defer to PT evaluation        Communication Communication Communication: No apparent difficulties   Cognition Arousal: Alert Behavior During Therapy: WFL for tasks assessed/performed Cognition: No apparent impairments                               Following commands: Intact       Cueing  General Comments   Cueing Techniques: Verbal cues  HR up to 127 with activity and pt endorsing mild fatigue with activity; edu on pacing and ECS/task simplification upon DC   Exercises     Shoulder Instructions      Home Living Family/patient expects to be discharged to:: Private residence Living Arrangements: Spouse/significant other Available Help at Discharge: Family;Available 24 hours/day Type of Home: House Home Access: Stairs to enter [employer] of Steps: 4 Entrance Stairs-Rails: Right;Left;Can reach both Home Layout: Two level;Able to live on main level with bedroom/bathroom Alternate Level Stairs-Number of Steps: flight; L rail going up Alternate Level Stairs-Rails: Left Bathroom Shower/Tub: Walk-in shower   Bathroom Toilet: Handicapped height Bathroom Accessibility: Yes   Home Equipment: Grab bars - tub/shower;BSC/3in1;Rolling Walker (2 wheels);Cane - single point;Crutches          Prior Functioning/Environment Prior Level of Function : Independent/Modified Independent;Driving             Mobility Comments: IND, one fall leading to hospitalization; could walk through grocery store pushing cart ADLs Comments: IND with ADLs, IADLs    OT Problem List: Decreased activity tolerance;Pain   OT Treatment/Interventions:        OT Goals(Current goals can be found in the care plan section)       OT Frequency:       Co-evaluation              AM-PAC OT 6 Clicks Daily Activity     Outcome Measure Help from another person eating meals?: None Help from another person taking care of personal grooming?: None Help from another person toileting, which includes  using toliet, bedpan, or urinal?: None Help from another person bathing (including washing, rinsing, drying)?: None Help from another person to put on and taking off regular upper body clothing?: None Help from another person to put on and taking off regular lower body clothing?: None 6 Click Score: 24   End of Session Nurse Communication: Mobility status  Activity Tolerance: Patient tolerated treatment well Patient left: in bed;with call bell/phone within reach;with family/visitor present  OT Visit Diagnosis: Other abnormalities of gait and mobility (R26.89)                Time: 9079-9054 OT Time Calculation (min): 25 min Charges:  OT General Charges $OT Visit: 1 Visit OT Evaluation $OT Eval Low Complexity: 1 Low OT Treatments $Self Care/Home Management : 8-22 mins Deneshia Zucker, OTR/L  05/29/24, 11:24 AM  Duwaine FORBES Saupe 05/29/2024, 10:23 AM

## 2024-05-29 NOTE — Hospital Course (Signed)
 63 y.o. female with medical history significant of HTN, HLD, COPD, GERD, depression, PUD, lumbago, IBS, anemia, metabolic syndrome, chronic pain, TIA, who presents with fall and left hip pain.   Assessment and Plan:   Acute left hip pain - MRI negative for fracture but does mention degenerative joint and hamstring tendinopathy with small partial-thickness tears of the-fibers.  Possibility of lateral greater trochanter bursitis as well.  Lidocaine  patch on board.  Will order scheduled to help with inflammatory process.  PT ordered.   Hypertensive urgency - Likely exacerbated by pain.  Systolic blood pressure 215 on presentation.  Showed improvement after IV labetalol .  Restarted patient's home medication regiment.  Blood pressure appears improved this morning.   Lactic acidosis - Etiology unclear.  Minimal concern for sepsis.  Etiology include dehydration or metformin  side effect.  Showing slow downtrend.  Will continue IV fluid hydration.  Recheck lactate.   Chronic HFpEF - Does not appear to be in acute exacerbation.   COPD - Does not appear to be in acute exacerbation.  Nebulizers as needed.   Prediabetes/metabolic syndrome - Holding metformin .   Obesity - Class I, BMI 30.  Encourage weight loss

## 2024-05-29 NOTE — Progress Notes (Signed)
 Progress Note   Patient: [name] FMW:[id number] DOB: [birth year] DOA: 05/28/2024  DOS: the patient was seen and examined on 05/29/2024   Brief hospital course:  63 y.o. female with medical history significant of HTN, HLD, COPD, GERD, depression, PUD, lumbago, IBS, anemia, metabolic syndrome, chronic pain, TIA, who presents with fall and left hip pain.  Assessment and Plan:  Acute left hip pain - MRI negative for fracture but does mention degenerative joint and hamstring tendinopathy with small partial-thickness tears of the-fibers.  Possibility of lateral greater trochanter bursitis as well.  Lidocaine  patch on board.  Will order scheduled to help with inflammatory process.  PT ordered.  Hypertensive urgency - Likely exacerbated by pain.  Systolic blood pressure 215 on presentation.  Showed improvement after IV labetalol .  Restarted patient's home medication regiment.  Blood pressure appears improved this morning.  Lactic acidosis - Etiology unclear.  Minimal concern for sepsis.  Etiology include dehydration or metformin  side effect.  Showing slow downtrend.  Will continue IV fluid hydration.  Recheck lactate.  Chronic HFpEF - Does not appear to be in acute exacerbation.  COPD - Does not appear to be in acute exacerbation.  Nebulizers as needed.  Prediabetes/metabolic syndrome - Holding metformin .  Obesity - Class I, BMI 30.  Encourage weight loss   Subjective: Patient lying in bed this morning, states she has pain all over especially in the left lateral portion of her hip.  Denies any fever, chills, chest pain, shortness of breath, nausea, vomiting, abdominal pain.  Physical Exam:  Vitals:   05/29/24 0352 05/29/24 0402 05/29/24 0500 05/29/24 0804  BP:  (!) 150/63  (!) 159/81  Pulse: (!) 111 (!) 107  (!) 104  Resp:    16  Temp:    98.6 F (37 C)  TempSrc:    Oral  SpO2: 99% 95%  98%  Weight:   94.9 kg   Height:        GENERAL:  Alert, pleasant, no acute  distress  HEENT:  EOMI CARDIOVASCULAR:  RRR, no murmurs appreciated RESPIRATORY:  Clear to auscultation, no wheezing, rales, or rhonchi GASTROINTESTINAL:  Soft, nontender, nondistended EXTREMITIES:  No LE edema bilaterally NEURO:  No new focal deficits appreciated SKIN:  No rashes noted PSYCH:  Appropriate mood and affect     Data Reviewed:  Imaging Studies: MR HIP LEFT WO CONTRAST Result Date: 05/28/2024 CLINICAL DATA:  Suspected left femoral neck fracture. EXAM: MR OF THE LEFT HIP WITHOUT CONTRAST TECHNIQUE: Multiplanar, multisequence MR imaging was performed. No intravenous contrast was administered. COMPARISON:  Radiographs, same date. FINDINGS: Both hips are normally located. No hip fracture or AVN. Mild/moderate bilateral hip joint degenerative changes. No joint effusions. Bilateral peritrochanteric tendinosis without bursitis. Bilateral proximal hamstring tendinopathy with small partial-thickness tears of the attachment fibers. The hip and pelvic musculature is unremarkable. No muscle tear, myositis or mass. The pubic symphysis and SI joints are intact. Moderate degenerative changes. No pelvic fractures are identified. No significant intrapelvic abnormalities are identified. Uterine fibroids noted. No inguinal mass hernia. IMPRESSION: 1. No hip fracture or AVN. 2. Mild/moderate bilateral hip joint degenerative changes. 3. Bilateral peritrochanteric tendinosis without bursitis. 4. Bilateral proximal hamstring tendinopathy with small partial-thickness tears of the attachment fibers. 5. Pubic symphysis and SI joint degenerative changes but no pelvic fractures. Electronically Signed   By: MYRTIS Stammer M.D.   On: 05/28/2024 17:51   DG Ankle Complete Left Result Date: 05/28/2024 CLINICAL DATA:  Fall and trauma to the left ankle.  EXAM: LEFT ANKLE COMPLETE - 3+ VIEW COMPARISON:  None Available. FINDINGS: No acute fracture or dislocation. The bones are osteopenic. The ankle mortise is intact. The  soft tissues are unremarkable. IMPRESSION: 1. No acute fracture or dislocation. 2. Osteopenia. Electronically Signed   By: Vanetta Chou M.D.   On: 05/28/2024 15:23   DG FEMUR MIN 2 VIEWS LEFT Result Date: 05/28/2024 CLINICAL DATA:  Fall, pain EXAM: LEFT FEMUR 2 VIEWS COMPARISON:  04/12/2019 FINDINGS: Changes of left knee replacement. No hardware complicating feature or joint effusion. Mild degenerative changes in the left hip with joint space narrowing and spurring. There is cortical irregularity noted laterally in the femoral neck region which could be related to spurring although subtle nondisplaced fracture is difficult to exclude. No subluxation or dislocation. IMPRESSION: Cortical regularity laterally in the femoral neck region. This could be related to degenerative spurring or subtle nondisplaced fracture. This could be further evaluated with CT if felt clinically indicated. Electronically Signed   By: Franky Crease M.D.   On: 05/28/2024 15:15   DG Shoulder Left Result Date: 05/28/2024 CLINICAL DATA:  Fall EXAM: LEFT SHOULDER - 2+ VIEW COMPARISON:  None Available. FINDINGS: There is no evidence of fracture or dislocation. There is moderate glenohumeral joint space narrowing. Soft tissues are unremarkable. IMPRESSION: 1. No fracture or dislocation. 2. Moderate glenohumeral joint space narrowing. Electronically Signed   By: Greig Pique M.D.   On: 05/28/2024 15:13   DG Pelvis 1-2 Views Result Date: 05/28/2024 CLINICAL DATA:  Fall EXAM: PELVIS - 1-2 VIEW COMPARISON:  None Available. FINDINGS: There is no evidence of pelvic fracture or diastasis. No pelvic bone lesions are seen. There are mild degenerative changes of both hips. IMPRESSION: No acute fracture or dislocation. Mild degenerative changes of both hips. Electronically Signed   By: Greig Pique M.D.   On: 05/28/2024 15:12   CT Head Wo Contrast Result Date: 05/28/2024 CLINICAL DATA:  Facial trauma, blunt.  Mechanical fall 2 days ago. EXAM:  CT HEAD WITHOUT CONTRAST TECHNIQUE: Contiguous axial images were obtained from the base of the skull through the vertex without intravenous contrast. RADIATION DOSE REDUCTION: This exam was performed according to the departmental dose-optimization program which includes automated exposure control, adjustment of the mA and/or kV according to patient size and/or use of iterative reconstruction technique. COMPARISON:  Head CT and MRI 12/08/2023 FINDINGS: Brain: There is no evidence of an acute infarct, intracranial hemorrhage, mass, midline shift, or extra-axial fluid collection. Cerebral volume is normal. The ventricles are normal in size. Cerebral white matter hypodensities are nonspecific but compatible with minimal chronic small vessel ischemic disease. Vascular: No hyperdense vessel. Skull: No fracture or suspicious lesion. Sinuses/Orbits: Visualized paranasal sinuses and mastoid air cells are clear. Unremarkable orbits. Other: None. IMPRESSION: No evidence of acute intracranial abnormality. Electronically Signed   By: Dasie Hamburg M.D.   On: 05/28/2024 14:49   DG Outside Films Body Result Date: 05/28/2024 This examination belongs to an outside facility and is stored here for comparison purposes only.  Contact the originating outside institution for any associated report or interpretation.  DG Outside Films Extremity Result Date: 05/28/2024 This examination belongs to an outside facility and is stored here for comparison purposes only.  Contact the originating outside institution for any associated report or interpretation.  DG Outside Films Spine Result Date: 05/28/2024 This examination belongs to an outside facility and is stored here for comparison purposes only.  Contact the originating outside institution for any associated report or interpretation.  There are no new results to review at this time.  Previous records (including but not limited to H&P, progress notes, nursing notes, TOC  management) were reviewed in assessment of this patient.  Labs: CBC: Recent Labs  Lab 05/28/24 1325 05/29/24 0350  WBC 11.2* 11.5*  NEUTROABS 6.5  --   HGB 13.6 12.8  HCT 42.1 38.4  MCV 95.7 94.3  PLT 426* 357   Basic Metabolic Panel: Recent Labs  Lab 05/28/24 1325 05/29/24 0350  NA 141 141  K 3.8 3.2*  CL 104 104  CO2 20* 23  GLUCOSE 101* 135*  BUN 8 8  CREATININE 0.69 0.56  CALCIUM  10.1 9.4   Liver Function Tests: No results for input(s): AST, ALT, ALKPHOS, BILITOT, PROT, ALBUMIN in the last 168 hours. CBG: No results for input(s): GLUCAP in the last 168 hours.  Scheduled Meds:  amitriptyline   10 mg Oral QHS   amLODipine   10 mg Oral Daily   atorvastatin   20 mg Oral Daily   carvedilol   6.25 mg Oral BID WC   cholecalciferol   1,000 Units Oral Daily   enoxaparin  (LOVENOX ) injection  40 mg Subcutaneous Q24H   ketorolac   15 mg Intravenous Q6H   lidocaine   1 patch Transdermal Q24H   losartan   100 mg Oral Daily   multivitamin with minerals  1 tablet Oral QODAY   pantoprazole   40 mg Oral Daily   potassium chloride   40 mEq Oral BID   QUEtiapine   25 mg Oral QHS   venlafaxine  XR  75 mg Oral Q breakfast   Continuous Infusions:  sodium chloride  75 mL/hr at 05/29/24 0103   PRN Meds:.acetaminophen , albuterol , dextromethorphan-guaiFENesin , gabapentin , hydrALAZINE , methocarbamol , morphine  injection, ondansetron  (ZOFRAN ) IV, oxyCODONE -acetaminophen   Family Communication: Husband at bedside  Disposition: Status is: Observation The patient remains OBS appropriate and will d/c before 2 midnights.     Time spent: 36 minutes  Length of inpatient stay: 0 days  Author: Carliss LELON Canales, DO 05/29/2024 12:00 PM  For on call review [email or web address].

## 2024-05-30 ENCOUNTER — Other Ambulatory Visit: Payer: Self-pay

## 2024-05-30 DIAGNOSIS — Y92009 Unspecified place in unspecified non-institutional (private) residence as the place of occurrence of the external cause: Secondary | ICD-10-CM | POA: Diagnosis not present

## 2024-05-30 DIAGNOSIS — W19XXXA Unspecified fall, initial encounter: Secondary | ICD-10-CM | POA: Diagnosis not present

## 2024-05-30 DIAGNOSIS — M25552 Pain in left hip: Secondary | ICD-10-CM | POA: Diagnosis not present

## 2024-05-30 DIAGNOSIS — Z122 Encounter for screening for malignant neoplasm of respiratory organs: Secondary | ICD-10-CM

## 2024-05-30 DIAGNOSIS — E872 Acidosis, unspecified: Secondary | ICD-10-CM | POA: Diagnosis not present

## 2024-05-30 DIAGNOSIS — I160 Hypertensive urgency: Secondary | ICD-10-CM | POA: Diagnosis not present

## 2024-05-30 DIAGNOSIS — Z87891 Personal history of nicotine dependence: Secondary | ICD-10-CM

## 2024-05-30 LAB — BASIC METABOLIC PANEL WITH GFR
Anion gap: 9 (ref 5–15)
BUN: 9 mg/dL (ref 8–23)
CO2: 25 mmol/L (ref 22–32)
Calcium: 9.2 mg/dL (ref 8.9–10.3)
Chloride: 108 mmol/L (ref 98–111)
Creatinine, Ser: 0.62 mg/dL (ref 0.44–1.00)
GFR, Estimated: >60 mL/min (ref >60)
Glucose, Bld: 118 mg/dL — ABNORMAL HIGH (ref 70–99)
Potassium: 3.5 mmol/L (ref 3.5–5.1)
Sodium: 142 mmol/L (ref 135–145)

## 2024-05-30 LAB — CBC
HCT: 38.9 % (ref 36.0–46.0)
Hemoglobin: 12.7 g/dL (ref 12.0–15.0)
MCH: 31.4 pg (ref 26.0–34.0)
MCHC: 32.6 g/dL (ref 30.0–36.0)
MCV: 96 fL (ref 80.0–100.0)
Platelets: 334 K/uL (ref 150–400)
RBC: 4.05 MIL/uL (ref 3.87–5.11)
RDW: 12.7 % (ref 11.5–15.5)
WBC: 8.3 K/uL (ref 4.0–10.5)
nRBC: 0 % (ref 0.0–0.2)

## 2024-05-30 LAB — MAGNESIUM: Magnesium: 2.2 mg/dL (ref 1.7–2.4)

## 2024-05-30 MED ORDER — LIDOCAINE 5 % EX PTCH: 1.0000 | MEDICATED_PATCH | CUTANEOUS | 0 refills | Status: AC

## 2024-05-30 MED ORDER — TRAZODONE HCL 50 MG PO TABS
25.0000 mg | ORAL_TABLET | Freq: Every evening | ORAL | Status: DC | PRN
  Administered 2024-05-30: 25 mg via ORAL
  Filled 2024-05-30: qty 1

## 2024-05-30 NOTE — Progress Notes (Signed)
 MEWS Progress Note  Patient Details Name: [name] MRN: [id number] DOB: 16-Sep-1961 Today's Date: 05/30/2024   MEWS Flowsheet Documentation:  Assess: MEWS Score Temp: 99.6 F (37.6 C) BP: (!) 154/68 MAP (mmHg): 91 Pulse Rate: (!) 119 ECG Heart Rate: 92 Resp: 18 Level of Consciousness: Alert SpO2: 99 % O2 Device: Room Air Assess: MEWS Score MEWS Temp: 0 MEWS Systolic: 0 MEWS Pulse: 2 MEWS RR: 0 MEWS LOC: 0 MEWS Score: 2 MEWS Score Color: Yellow Assess: SIRS CRITERIA SIRS Temperature : 0 SIRS Respirations : 0 SIRS Pulse: 1 SIRS WBC: 0 SIRS Score Sum : 1 SIRS Temperature : 0 SIRS Pulse: 1 SIRS Respirations : 0 SIRS WBC: 0 SIRS Score Sum : 1 Provider Notification Provider Name/Title: Arlon Honey Date Provider Notified: 05/30/24 Time Provider Notified: 0935 Method of Notification:  (Secure Chat) Notification Reason: Other (Comment) (Yellow MEWS) Provider response: No new orders Date of Provider Response: 05/30/24 Time of Provider Response: [id number]      [name] I [name] 05/30/2024, 9:39 AM

## 2024-05-30 NOTE — TOC Transition Note (Signed)
 Transition of Care [facility]) - Discharge Note   Patient Details  Name: [name] MRN: [id number] Date of Birth: 1961-03-04  [facility] CM/SW Contact:  Lauraine JAYSON Carpen, LCSW Phone Number: 05/30/2024, 11:26 AM   Clinical Narrative: Patient has orders to discharge home today. CSW met with patient. No family at bedside. CSW introduced role and explained that PT recommendations would be discussed. Patient is not interested in outpatient PT at this time. She will contact her PCP if she changes her mind later on. No further concerns. Her husband will pick her up. CSW signing off.  Final next level of care: Home/Self Care Barriers to Discharge: Barriers Resolved   Patient Goals and CMS Choice            Discharge Placement                Patient to be transferred to facility by: Husband   Patient and family notified of of transfer: 05/30/24  Discharge Plan and Services Additional resources added to the After Visit Summary for                                       Social Drivers of Health (SDOH) Interventions SDOH Screenings   Food Insecurity: No Food Insecurity (05/29/2024)  Housing: Unknown (05/29/2024)  Transportation Needs: No Transportation Needs (05/29/2024)  Utilities: Not At Risk (05/29/2024)  Alcohol  Screen: Low Risk  (03/31/2024)  Depression (PHQ2-9): Low Risk  (03/31/2024)  Financial Resource Strain: Low Risk  (03/31/2024)  Physical Activity: Patient Declined (03/31/2024)  Social Connections: Socially Isolated (05/29/2024)  Stress: No Stress Concern Present (03/31/2024)  Tobacco Use: Medium Risk (05/28/2024)  Health Literacy: Adequate Health Literacy (03/31/2024)     Readmission Risk Interventions     No data to display

## 2024-05-30 NOTE — Discharge Summary (Signed)
 Physician Discharge Summary   Patient: [name] MRN: [id number] DOB: [birth year]  Admit date:     05/28/2024  Discharge date: 05/30/24  Discharge Physician: Carliss LELON Canales   PCP: Sowles, Krichna, MD   Recommendations at discharge:    Pt to be discharged home.   If you experience worsening fever, chills, chest pain, shortness of breath, or other concerning symptoms, please call your PCP or go to the emergency department immediately.  Discharge Diagnoses: Principal Problem:   Hypertensive urgency Active Problems:   Fall at home, initial encounter   Left hip pain   Lactic acidosis   Chronic diastolic CHF (congestive heart failure) (HCC)   COPD, mild (HCC)   History of TIA (transient ischemic attack)   Dyslipidemia   Major depression, recurrent, chronic (HCC)   Chronic pain   Metabolic syndrome   Prediabetes   Obesity (BMI 30-39.9)  Resolved Problems:   * No resolved hospital problems. *   Hospital Course:  63 y.o. female with medical history significant of HTN, HLD, COPD, GERD, depression, PUD, lumbago, IBS, anemia, metabolic syndrome, chronic pain, TIA, who presents with fall and left hip pain.   Assessment and Plan:   Acute left hip pain - MRI negative for fracture but does mention degenerative joint and hamstring tendinopathy with small partial-thickness tears of the-fibers.  Possibility of lateral greater trochanter bursitis as well.  Lidocaine  patch on board and appears to be improving.  Responding well to anti-inflammatories.  Can can take ibuprofen  OTC 600 mg 3 times daily as needed if needed.  Evaluated by physical therapy recommending outpatient PT.  Hypertensive urgency - Likely exacerbated by pain.  Systolic blood pressure 215 on presentation.  Showed improvement after IV labetalol .  Restarted patient's home medication regiment.  Blood pressure appears improved this morning.  Recommend patient follow-up closely with PCP to monitor and possibly titrate  antihypertensive regimen.   Lactic acidosis - Etiology unclear.  Minimal concern for sepsis.  Etiology include dehydration or metformin  side effect.  Resolved after IV fluid hydration.     Chronic HFpEF - Does not appear to be in acute exacerbation.   COPD - Does not appear to be in acute exacerbation.  Nebulizers as needed.   Prediabetes/metabolic syndrome - Recommend discontinuing metformin .  Can follow-up with PCP and resume other diabetic regimen.   Obesity - Class I, BMI 30.  Encourage weight loss   Consultants: None Procedures performed: None Disposition: Home Diet recommendation:  Discharge Diet Orders (From admission, onward)     Start     Ordered   05/30/24 0000  Diet - low sodium heart healthy        05/30/24 1107           Cardiac and Carb modified diet  DISCHARGE MEDICATION: Allergies as of 05/30/2024   No Known Allergies      Medication List     STOP taking these medications    metFORMIN  500 MG tablet Commonly known as: GLUCOPHAGE        TAKE these medications    amitriptyline  10 MG tablet Commonly known as: ELAVIL  Take 1 tablet (10 mg total) by mouth at bedtime.   amLODipine  5 MG tablet Commonly known as: NORVASC  Take 1 tablet (5 mg total) by mouth daily.   aspirin  EC 81 MG tablet Take 1 tablet (81 mg total) by mouth daily. Swallow whole.   atorvastatin  20 MG tablet Commonly known as: LIPITOR Take 1 tablet (20 mg total) by mouth daily.  B-12 1000 MCG Subl Place 1 each under the tongue 3 (three) times a week.   carvedilol  6.25 MG tablet Commonly known as: COREG  TAKE 1 TABLET(6.25 MG) BY MOUTH TWICE DAILY   cholecalciferol  1000 units tablet Commonly known as: VITAMIN D  Take 1,000 Units by mouth daily.   desvenlafaxine  50 MG 24 hr tablet Commonly known as: PRISTIQ  Take 1 tablet (50 mg total) by mouth daily.   gabapentin  300 MG capsule Commonly known as: NEURONTIN  Take 300 mg by mouth every 6 (six) hours as needed (Nerve  pain).   lidocaine  5 % Commonly known as: LIDODERM  Place 1 patch onto the skin daily. Remove & Discard patch within 12 hours or as directed by MD   losartan -hydrochlorothiazide  100-25 MG tablet Commonly known as: HYZAAR Take 1 tablet by mouth daily.   multivitamin with minerals tablet Take 1 tablet by mouth every other day.   Narcan 4 MG/0.1ML Liqd nasal spray kit Generic drug: naloxone Place 1 spray into the nose once.   Oxycodone  HCl 20 MG Tabs Take 20 mg by mouth every 4 (four) hours as needed (pain).   pantoprazole  40 MG tablet Commonly known as: PROTONIX  Take 1 tablet (40 mg total) by mouth daily.   QUEtiapine  25 MG tablet Commonly known as: SEROquel  Take 1 tablet (25 mg total) by mouth at bedtime.   spironolactone  25 MG tablet Commonly known as: ALDACTONE  TAKE 1/2 TABLET(12.5 MG) BY MOUTH DAILY   tiZANidine  4 MG tablet Commonly known as: ZANAFLEX  Take 4 mg by mouth every 8 (eight) hours.   valACYclovir  1000 MG tablet Commonly known as: VALTREX  TAKE 1 TABLET(1000 MG) BY MOUTH TWICE DAILY AS NEEDED   Vraylar  1.5 MG capsule Generic drug: cariprazine  Take 1.5 mg by mouth daily.         Discharge Exam: Filed Weights   05/28/24 1323 05/29/24 0500 05/30/24 0302  Weight: 90.7 kg 94.9 kg 95.1 kg    GENERAL:  Alert, pleasant, no acute distress  HEENT:  EOMI CARDIOVASCULAR:  RRR, no murmurs appreciated RESPIRATORY:  Clear to auscultation, no wheezing, rales, or rhonchi GASTROINTESTINAL:  Soft, nontender, nondistended EXTREMITIES:  No LE edema bilaterally NEURO:  No new focal deficits appreciated SKIN:  No rashes noted PSYCH:  Appropriate mood and affect     Condition at discharge: improving  The results of significant diagnostics from this hospitalization (including imaging, microbiology, ancillary and laboratory) are listed below for reference.   Imaging Studies: CT CHEST LUNG CA SCREEN LOW DOSE W/O CM Result Date: 05/29/2024 CLINICAL DATA:  Former  smoker, quit 2023, 45 pack-year smoking history, asymptomatic EXAM: CT CHEST WITHOUT CONTRAST LOW-DOSE FOR LUNG CANCER SCREENING TECHNIQUE: Multidetector CT imaging of the chest was performed following the standard protocol without IV contrast. RADIATION DOSE REDUCTION: This exam was performed according to the departmental dose-optimization program which includes automated exposure control, adjustment of the mA and/or kV according to patient size and/or use of iterative reconstruction technique. COMPARISON:  04/15/2023 FINDINGS: Cardiovascular: No significant coronary artery calcification. Global cardiac size iswithin normal limits. No pericardial effusion. Central pulmonary arteries are of normal caliber. Mild atherosclerotic calcification within the thoracic aorta. No aortic aneurysm. Mediastinum/Nodes: No enlarged mediastinal, hilar, or axillary lymph nodes. Thyroid  gland, trachea, and esophagus demonstrate no significant findings. Lungs/Pleura: Mild bronchial wall thickening in keeping with airway inflammation. Multiple bilateral pulmonary nodules are again identified and are stable since prior examination. No new focal pulmonary nodules or infiltrates. See radiologist derived volumetric analysis. No pneumothorax or pleural effusion. Upper  Abdomen: Mild hepatic steatosis.  No acute abnormality. Musculoskeletal: No chest wall mass or suspicious bone lesions identified. IMPRESSION: 1. Lung-RADS 2, benign appearance or behavior. Continue annual screening with low-dose chest CT without contrast in 12 months. 2. Chronic airway inflammation. 3. Mild hepatic steatosis. Aortic Atherosclerosis (ICD10-I70.0). Electronically Signed   By: Dorethia Molt M.D.   On: 05/29/2024 22:44   MR HIP LEFT WO CONTRAST Result Date: 05/28/2024 CLINICAL DATA:  Suspected left femoral neck fracture. EXAM: MR OF THE LEFT HIP WITHOUT CONTRAST TECHNIQUE: Multiplanar, multisequence MR imaging was performed. No intravenous contrast was  administered. COMPARISON:  Radiographs, same date. FINDINGS: Both hips are normally located. No hip fracture or AVN. Mild/moderate bilateral hip joint degenerative changes. No joint effusions. Bilateral peritrochanteric tendinosis without bursitis. Bilateral proximal hamstring tendinopathy with small partial-thickness tears of the attachment fibers. The hip and pelvic musculature is unremarkable. No muscle tear, myositis or mass. The pubic symphysis and SI joints are intact. Moderate degenerative changes. No pelvic fractures are identified. No significant intrapelvic abnormalities are identified. Uterine fibroids noted. No inguinal mass hernia. IMPRESSION: 1. No hip fracture or AVN. 2. Mild/moderate bilateral hip joint degenerative changes. 3. Bilateral peritrochanteric tendinosis without bursitis. 4. Bilateral proximal hamstring tendinopathy with small partial-thickness tears of the attachment fibers. 5. Pubic symphysis and SI joint degenerative changes but no pelvic fractures. Electronically Signed   By: MYRTIS Stammer M.D.   On: 05/28/2024 17:51   DG Ankle Complete Left Result Date: 05/28/2024 CLINICAL DATA:  Fall and trauma to the left ankle. EXAM: LEFT ANKLE COMPLETE - 3+ VIEW COMPARISON:  None Available. FINDINGS: No acute fracture or dislocation. The bones are osteopenic. The ankle mortise is intact. The soft tissues are unremarkable. IMPRESSION: 1. No acute fracture or dislocation. 2. Osteopenia. Electronically Signed   By: Vanetta Chou M.D.   On: 05/28/2024 15:23   DG FEMUR MIN 2 VIEWS LEFT Result Date: 05/28/2024 CLINICAL DATA:  Fall, pain EXAM: LEFT FEMUR 2 VIEWS COMPARISON:  04/12/2019 FINDINGS: Changes of left knee replacement. No hardware complicating feature or joint effusion. Mild degenerative changes in the left hip with joint space narrowing and spurring. There is cortical irregularity noted laterally in the femoral neck region which could be related to spurring although subtle  nondisplaced fracture is difficult to exclude. No subluxation or dislocation. IMPRESSION: Cortical regularity laterally in the femoral neck region. This could be related to degenerative spurring or subtle nondisplaced fracture. This could be further evaluated with CT if felt clinically indicated. Electronically Signed   By: Franky Crease M.D.   On: 05/28/2024 15:15   DG Shoulder Left Result Date: 05/28/2024 CLINICAL DATA:  Fall EXAM: LEFT SHOULDER - 2+ VIEW COMPARISON:  None Available. FINDINGS: There is no evidence of fracture or dislocation. There is moderate glenohumeral joint space narrowing. Soft tissues are unremarkable. IMPRESSION: 1. No fracture or dislocation. 2. Moderate glenohumeral joint space narrowing. Electronically Signed   By: Greig Pique M.D.   On: 05/28/2024 15:13   DG Pelvis 1-2 Views Result Date: 05/28/2024 CLINICAL DATA:  Fall EXAM: PELVIS - 1-2 VIEW COMPARISON:  None Available. FINDINGS: There is no evidence of pelvic fracture or diastasis. No pelvic bone lesions are seen. There are mild degenerative changes of both hips. IMPRESSION: No acute fracture or dislocation. Mild degenerative changes of both hips. Electronically Signed   By: Greig Pique M.D.   On: 05/28/2024 15:12   CT Head Wo Contrast Result Date: 05/28/2024 CLINICAL DATA:  Facial trauma, blunt.  Mechanical  fall 2 days ago. EXAM: CT HEAD WITHOUT CONTRAST TECHNIQUE: Contiguous axial images were obtained from the base of the skull through the vertex without intravenous contrast. RADIATION DOSE REDUCTION: This exam was performed according to the departmental dose-optimization program which includes automated exposure control, adjustment of the mA and/or kV according to patient size and/or use of iterative reconstruction technique. COMPARISON:  Head CT and MRI 12/08/2023 FINDINGS: Brain: There is no evidence of an acute infarct, intracranial hemorrhage, mass, midline shift, or extra-axial fluid collection. Cerebral volume is  normal. The ventricles are normal in size. Cerebral white matter hypodensities are nonspecific but compatible with minimal chronic small vessel ischemic disease. Vascular: No hyperdense vessel. Skull: No fracture or suspicious lesion. Sinuses/Orbits: Visualized paranasal sinuses and mastoid air cells are clear. Unremarkable orbits. Other: None. IMPRESSION: No evidence of acute intracranial abnormality. Electronically Signed   By: Dasie Hamburg M.D.   On: 05/28/2024 14:49   DG Outside Films Body Result Date: 05/28/2024 This examination belongs to an outside facility and is stored here for comparison purposes only.  Contact the originating outside institution for any associated report or interpretation.  DG Outside Films Extremity Result Date: 05/28/2024 This examination belongs to an outside facility and is stored here for comparison purposes only.  Contact the originating outside institution for any associated report or interpretation.  DG Outside Films Spine Result Date: 05/28/2024 This examination belongs to an outside facility and is stored here for comparison purposes only.  Contact the originating outside institution for any associated report or interpretation.   Microbiology: Results for orders placed or performed in visit on 02/05/22  Urine Culture     Status: None   Collection Time: 02/05/22  9:42 AM   Specimen: Urine  Result Value Ref Range Status   MICRO NUMBER: [id number]  Final   SPECIMEN QUALITY: Adequate  Final   Sample Source URINE  Final   STATUS: FINAL  Final   Result: No Growth  Final    Labs: CBC: Recent Labs  Lab 05/28/24 1325 05/29/24 0350 05/30/24 0330  WBC 11.2* 11.5* 8.3  NEUTROABS 6.5  --   --   HGB 13.6 12.8 12.7  HCT 42.1 38.4 38.9  MCV 95.7 94.3 96.0  PLT 426* 357 334   Basic Metabolic Panel: Recent Labs  Lab 05/28/24 1325 05/29/24 0350 05/30/24 0330  NA 141 141 142  K 3.8 3.2* 3.5  CL 104 104 108  CO2 20* 23 25  GLUCOSE 101* 135* 118*  BUN 8  8 9   CREATININE 0.69 0.56 0.62  CALCIUM  10.1 9.4 9.2  MG  --   --  2.2   Liver Function Tests: No results for input(s): AST, ALT, ALKPHOS, BILITOT, PROT, ALBUMIN in the last 168 hours. CBG: No results for input(s): GLUCAP in the last 168 hours.  Discharge time spent: 28 minutes.  Length of inpatient stay: 1 days  Signed: Carliss LELON Canales, DO [facility] 05/30/2024

## 2024-05-30 NOTE — Discharge Instructions (Signed)

## 2024-06-02 ENCOUNTER — Other Ambulatory Visit: Payer: Self-pay

## 2024-06-02 DIAGNOSIS — F339 Major depressive disorder, recurrent, unspecified: Secondary | ICD-10-CM

## 2024-06-02 DIAGNOSIS — F5104 Psychophysiologic insomnia: Secondary | ICD-10-CM

## 2024-06-08 DIAGNOSIS — M542 Cervicalgia: Secondary | ICD-10-CM | POA: Diagnosis not present

## 2024-06-08 DIAGNOSIS — M545 Low back pain, unspecified: Secondary | ICD-10-CM | POA: Diagnosis not present

## 2024-06-08 DIAGNOSIS — Z79891 Long term (current) use of opiate analgesic: Secondary | ICD-10-CM | POA: Diagnosis not present

## 2024-06-08 DIAGNOSIS — G894 Chronic pain syndrome: Secondary | ICD-10-CM | POA: Diagnosis not present

## 2024-06-08 DIAGNOSIS — M5431 Sciatica, right side: Secondary | ICD-10-CM | POA: Diagnosis not present

## 2024-06-14 ENCOUNTER — Ambulatory Visit: Admitting: Cardiology

## 2024-06-22 ENCOUNTER — Ambulatory Visit: Admitting: Cardiology

## 2024-07-02 ENCOUNTER — Other Ambulatory Visit: Payer: Self-pay | Admitting: Family Medicine

## 2024-07-02 DIAGNOSIS — K582 Mixed irritable bowel syndrome: Secondary | ICD-10-CM

## 2024-07-12 ENCOUNTER — Ambulatory Visit: Payer: Self-pay

## 2024-07-12 NOTE — Telephone Encounter (Signed)
 FYI Only or Action Required?: Action required by provider: Refusing ED, needs call back asap with next steps, alerted [facility] to ED refusal.  Patient was last seen in primary care on 02/02/2024 by Sowles, Krichna, MD.  Called Nurse Triage reporting Numbness, burning in toes, severe chest pain comes and goes, and toes feel freezing though warm to touch.  Symptoms began several months ago.  Interventions attempted: OTC medications: TUMS.  Symptoms are: sporadic and severe chest pain, constant neurologic symptoms.  Triage Disposition: Go to ED Now (or PCP Triage)  Patient/caregiver understands and will follow disposition?: No, refuses disposition      Copied from CRM #[id number]. Topic: Clinical - Red Word Triage >> Jul 12, 2024 11:14 AM Rea ORN wrote: Red Word that prompted transfer to Nurse Triage: Pt has bilateral numbness and burning in her toes. She stated the are always cold Reason for Disposition  Patient sounds very sick or weak to the triager  Answer Assessment - Initial Assessment Questions 1. SYMPTOM: What is the main symptom you are concerned about? (e.g., weakness, numbness)        Bilateral numbness and burning in toes, toes feel freezing even though warm to touch Sometimes have to put on 2 pairs of socks 2. ONSET: When did this start? (e.g., minutes, hours, days; while sleeping)     Been a while, almost a year 4. PATTERN Does this come and go, or has it been constant since it started?  Is it present now?     All the time 5. CARDIAC SYMPTOMS: Have you had any of the following symptoms: chest pain, difficulty breathing, palpitations?     denies, have CHF, sometimes get a knot in chest and hurts really really bad, not been getting more frequent or intense lately, about same each time, thrown up after come on, 10/10, last felt a month ago, have heart doc, not talked to him about it, for a while, all of a sudden, lasts 5 min at a time, chew up some TUMS and then it'll go on  away after while 6. NEUROLOGIC SYMPTOMS: Have you had any of the following symptoms: headache, dizziness, vision loss, double vision, changes in speech, unsteady on your feet?     Denies To question of weakness or numbness anywhere else pt paused for while then said um no ma'am 7. OTHER SYMPTOMS: Do you have any other symptoms?     Denies  Significant cardiac hx   Advised pt call 911 next time chest pain occurs, advised pt go to ED for full workup of symptoms asap, pt refusing, sending message to PCP for call back asap with next steps including appt. Alerted [facility] to ED refusal.  Protocols used: Neurologic Deficit-A-AH

## 2024-07-12 NOTE — Telephone Encounter (Signed)
 FYI

## 2024-07-12 NOTE — Telephone Encounter (Signed)
 Called pt back and advised to head to ER. Pt states she does not feel like needed to go at this moment since no chest pain. I strongly suggested to head over to ER if Chest pain comes back and starts to feel all sx again. Pt verbalized understanding.

## 2024-07-13 ENCOUNTER — Ambulatory Visit: Admitting: Nurse Practitioner

## 2024-07-17 ENCOUNTER — Ambulatory Visit: Admitting: Cardiology

## 2024-07-18 ENCOUNTER — Ambulatory Visit: Admitting: Family Medicine

## 2024-07-18 ENCOUNTER — Ambulatory Visit: Admitting: Cardiology

## 2024-07-18 NOTE — Progress Notes (Deleted)
 Cardiology Office Note   Date:  07/18/2024  ID:  [name], DOB 08/12/1961, MRN [id number] PCP: Glenard Mire, MD  [facility] Providers Cardiologist:  Redell Cave, MD { Click to update primary MD,subspecialty MD or APP then REFRESH:1}    History of Present Illness [name] is a 63 y.o. female with a past medical history of chronic HFpEF, essential hypertension, hyperlipidemia, diabetes, former smoker x 50+ years, COPD, peripheral edema, who is here for follow-up on her chronic HFpEF.   Previously was admitted in April 2023 due to facial drops.  Her symptoms resolved upon arrival to the emergency department she was diagnosed with TIA.  At that time she was started on aspirin  and Lipitor.  She started with symptoms of peripheral edema that was ongoing for about a year but denies any palpitations, shortness of breath or chest pain.  She also endorsed some occasional orthopnea.  Echocardiogram completed 05/2022 showed an EF of 60 to 65% with G2 DD.  There were concerns of TIA versus CVA and she was placed on a long-term monitor which showed no evidence of atrial fibrillation or flutter to suggest etiology of CVA.  Patient triggered events were with sinus rhythm and sinus tachycardia.  She had minimal heart rate of 66 bpm maximal heart rate 169 beats minute predominant underlying rhythm was sinus rhythm overall benign cardiac monitor.  She was seen 08/2022 was overall doing well.  She was started on spironolactone .  She was also evaluated 12/04/2022 with reports of acid reflux.  She been taking antiacid tablets and Pepcid .  She denies any chest pain or shortness of breath.  Occasional lower leg edema, orthopnea, or PND.  EKG shows sinus tachycardia with rate of 106 with an elevated blood pressure.  She was started on carvedilol  6.25 mg twice daily with follow-up in 3 months.   She was last seen in clinic 05/10/2023 stating that she been having left-sided chest pain beginning 2  days prior.  Symptoms were not associated with exertion, chest palpitations make symptoms worse.  She was taking furosemide  to help with edema.  She does not check her blood pressure frequently at home.  Endorses muscle aches and cramping all over.  She been on atorvastatin  for approximately a year that had to be stopped due to myalgias.  With complaints of chest pain and associated symptoms she was scheduled for coronary CTA.  Coronary calcium  score was 0.  She was hospitalized at [facility] 7/27 - 05/30/2024 with hypertensive urgency, fall at home, lactic acidosis, and left hip pain.  MRI was completed and was negative for fractures but did mention degenerative joint and hamstring tendinopathy with small partial-thickness tears of the fibers.  She was evaluated by physical therapy recommended outpatient PT.  She was also offered ibuprofen  over-the-counter 600 mg 3 times daily as needed.  On presentation her systolic blood pressure was greater than 200.  She had improvement after IV labetalol .  She was restarted on her home medication regimen.  Blood pressure returned to normal.  Lactic acidosis with an unclear etiology minimal concern for sepsis.  Resolved after IV fluid hydration.  CHF and COPD was not in any acute exacerbation during her hospitalization.  She returns to clinic today  ROS: 10 point review of systems has been reviewed and considered negative except ones been listed in the HPI  Studies Reviewed     cCalcium Scoring 06/10/2023 IMPRESSION: 1. Coronary calcium  score of 0.   2. Normal coronary origin with  right dominance.   3. No evidence of CAD.   4. CAD-RADS 0. Consider non-atherosclerotic causes of chest pain. Heart monitor 04/2022 Patch Wear Time:  14 days and 0 hours (2023-05-26T15:09:28-0400 to 2023-06-09T15:09:32-0400)   Patient had a min HR of 66 bpm, max HR of 160 bpm, and avg HR of 101 bpm. Predominant underlying rhythm was Sinus Rhythm. Isolated SVEs were rare (<1.0%), SVE  Couplets were rare (<1.0%), and SVE Triplets were rare (<1.0%). Isolated VEs were rare (<1.0%),  and no VE Couplets or VE Triplets were present.    No evidence for atrial fibrillation or atrial flutter to suggest etiology of CVA.  Patient triggered events associated with sinus rhythm or sinus tachycardia.  Overall benign cardiac monitor.   Echo 01/2022 1. Left ventricular ejection fraction, by estimation, is 60 to 65%. The  left ventricle has normal function. The left ventricle has no regional  wall motion abnormalities. Left ventricular diastolic parameters are  consistent with Grade II diastolic  dysfunction (pseudonormalization).   2. Right ventricular systolic function is normal. The right ventricular  size is normal. There is mildly elevated pulmonary artery systolic  pressure. The estimated right ventricular systolic pressure is 37.5 mmHg.   3. Left atrial size was mildly dilated.   4. The mitral valve is normal in structure. Mild mitral valve  regurgitation. No evidence of mitral stenosis.   5. Tricuspid valve regurgitation is mild to moderate.   6. The aortic valve is normal in structure. Aortic valve regurgitation is  not visualized. No aortic stenosis is present.   7. The inferior vena cava is normal in size with greater than 50%  respiratory variability, suggesting right atrial pressure of 3 mmHg.    Risk Assessment/Calculations   No BP recorded.  {Refresh Note OR Click here to enter BP  :1}***       Physical Exam VS:  There were no vitals taken for this visit.       Wt Readings from Last 3 Encounters:  05/30/24 209 lb 10.5 oz (95.1 kg)  03/31/24 202 lb (91.6 kg)  02/02/24 206 lb (93.4 kg)    GEN: Well nourished, well developed in no acute distress NECK: No JVD; No carotid bruits CARDIAC: ***RRR, no murmurs, rubs, gallops RESPIRATORY:  Clear to auscultation without rales, wheezing or rhonchi  ABDOMEN: Soft, non-tender, non-distended EXTREMITIES:  No edema; No  deformity   ASSESSMENT AND PLAN Chronic HFpEF Hypertension History of TIA versus CVA COPD    {Are you ordering a CV Procedure (e.g. stress test, cath, DCCV, TEE, etc)?   Press F2        :[id number]}  Dispo: ***  Signed, October Peery, NP

## 2024-07-24 ENCOUNTER — Ambulatory Visit: Admitting: Family Medicine

## 2024-08-02 ENCOUNTER — Ambulatory Visit (INDEPENDENT_AMBULATORY_CARE_PROVIDER_SITE_OTHER): Admitting: Family Medicine

## 2024-08-02 ENCOUNTER — Encounter: Payer: Self-pay | Admitting: Family Medicine

## 2024-08-02 VITALS — BP 110/64 | HR 72 | Resp 16 | Ht 68.0 in | Wt 217.7 lb

## 2024-08-02 DIAGNOSIS — Z23 Encounter for immunization: Secondary | ICD-10-CM

## 2024-08-02 DIAGNOSIS — F5104 Psychophysiologic insomnia: Secondary | ICD-10-CM | POA: Diagnosis not present

## 2024-08-02 DIAGNOSIS — I272 Pulmonary hypertension, unspecified: Secondary | ICD-10-CM

## 2024-08-02 DIAGNOSIS — I5032 Chronic diastolic (congestive) heart failure: Secondary | ICD-10-CM

## 2024-08-02 DIAGNOSIS — E538 Deficiency of other specified B group vitamins: Secondary | ICD-10-CM | POA: Diagnosis not present

## 2024-08-02 DIAGNOSIS — N1831 Chronic kidney disease, stage 3a: Secondary | ICD-10-CM | POA: Diagnosis not present

## 2024-08-02 DIAGNOSIS — J449 Chronic obstructive pulmonary disease, unspecified: Secondary | ICD-10-CM

## 2024-08-02 DIAGNOSIS — B001 Herpesviral vesicular dermatitis: Secondary | ICD-10-CM

## 2024-08-02 DIAGNOSIS — M792 Neuralgia and neuritis, unspecified: Secondary | ICD-10-CM

## 2024-08-02 DIAGNOSIS — I10 Essential (primary) hypertension: Secondary | ICD-10-CM

## 2024-08-02 DIAGNOSIS — F339 Major depressive disorder, recurrent, unspecified: Secondary | ICD-10-CM | POA: Diagnosis not present

## 2024-08-02 DIAGNOSIS — K219 Gastro-esophageal reflux disease without esophagitis: Secondary | ICD-10-CM | POA: Diagnosis not present

## 2024-08-02 DIAGNOSIS — G894 Chronic pain syndrome: Secondary | ICD-10-CM

## 2024-08-02 DIAGNOSIS — I700 Atherosclerosis of aorta: Secondary | ICD-10-CM | POA: Diagnosis not present

## 2024-08-02 MED ORDER — ATORVASTATIN CALCIUM 20 MG PO TABS
20.0000 mg | ORAL_TABLET | Freq: Every day | ORAL | 1 refills | Status: AC
Start: 2024-08-02 — End: ?

## 2024-08-02 MED ORDER — TRELEGY ELLIPTA 100-62.5-25 MCG/ACT IN AEPB: 1.0000 | INHALATION_SPRAY | Freq: Every day | RESPIRATORY_TRACT | 1 refills | Status: AC

## 2024-08-02 MED ORDER — DESVENLAFAXINE SUCCINATE ER 50 MG PO TB24: 50.0000 mg | ORAL_TABLET | Freq: Every day | ORAL | 1 refills | Status: AC

## 2024-08-02 MED ORDER — LOSARTAN POTASSIUM-HCTZ 100-25 MG PO TABS: 1.0000 | ORAL_TABLET | Freq: Every day | ORAL | 1 refills | Status: AC

## 2024-08-02 MED ORDER — PANTOPRAZOLE SODIUM 40 MG PO TBEC: 40.0000 mg | DELAYED_RELEASE_TABLET | Freq: Every day | ORAL | 1 refills | Status: AC

## 2024-08-02 MED ORDER — VALACYCLOVIR HCL 1 G PO TABS: ORAL_TABLET | ORAL | 1 refills | Status: AC

## 2024-08-02 MED ORDER — AMLODIPINE BESYLATE 5 MG PO TABS
5.0000 mg | ORAL_TABLET | Freq: Every day | ORAL | 1 refills | Status: AC
Start: 2024-08-02 — End: ?

## 2024-08-02 MED ORDER — QUETIAPINE FUMARATE 25 MG PO TABS: 25.0000 mg | ORAL_TABLET | Freq: Every day | ORAL | 1 refills | Status: AC

## 2024-08-02 NOTE — Progress Notes (Signed)
 Name: [name]   MRN: [id number]    DOB: 05/25/1961   Date:08/02/2024       Progress Note  Subjective  Chief Complaint  Chief Complaint  Patient presents with   Referral   Numbness    After surgery patient is having issues with bilateral feet with numbness, burning sensation   Discussed the use of AI scribe software for clinical note transcription with the patient, who gave verbal consent to proceed.  History of Present Illness [name] is a 63 year old female with chronic pain syndrome and neuropathy who presents for follow-up and referral for foot issues. She is accompanied by her daughter, who is her primary caregiver.  She has ongoing issues with her feet, including sensations of coldness, burning, and numbness, particularly in her toes. The feeling is described as if her toes are 'frozen' and might 'fall off.' These symptoms have persisted for years but seems to be getting worse. She also has some swelling on left ankle since surgery years ago - but stable.  She experiences paresthesia and takes gabapentin  for neuropathic pain.  She has a history of chronic pain syndrome and degenerative disc disease in the lumbar spine, for which she sees a pain doctor. She is on high doses of oxycodone  and uses Lidoderm  patches for pain management, plus gabapentin . She wears multiple pairs of socks to bed due to discomfort in her feet. She states pain is still bothersome even on current regiment  She has chronic kidney disease stage 3A, with kidney function fluctuating between 42 and 60. She is currently taking losartan , which is both a blood pressure and kidney protective medication, along with amlodipine , HCTZ, and carvedilol  for hypertension.  She has a history of B12 deficiency and continues to take sublingual B12 supplements. Her levels were last checked in December of the previous year.  She has chronic diastolic heart failure and pulmonary hypertension. She has not seen her  cardiologist since July when she was hospitalized for hypertensive urgency. She is on medications including losartan , carvedilol , and spironolactone , although she has been out of spironolactone  since April. She experiences shortness of breath with exertion but not when lying flat, and has swelling in her left leg, which she attributes to her foot surgery.  She Major depression that is recurrent and chronic and is currently taking Pristiq . She reports ongoing mood issues and has been prescribed Vraylar , although she does not have it currently. She uses Seroquel  for sleep. She tried to see a therapist but states unable to schedule a visit.  She has COPD and reports coughing primarily at night. She has tried Advair in the past and is considering using Trelegy, which is covered by her insurance.  She has a history of IBS, experiencing alternating constipation and diarrhea, exacerbated by oxycodone  use. She previously took amitriptyline  for IBS but has not used it in over six months and symptoms are not any different     Patient Active Problem List   Diagnosis Date Noted   Hypertensive urgency 05/28/2024   Fall at home, initial encounter 05/28/2024   Lactic acidosis 05/28/2024   Chronic diastolic CHF (congestive heart failure) (HCC) 05/28/2024   Left hip pain 05/28/2024   Chronic pain of both knees 08/12/2023   History of TIA (transient ischemic attack) 07/19/2023   B12 deficiency 07/19/2023   Chronic GERD    Gastric erosion    Pulmonary hypertension (HCC) 03/19/2022   Diastolic heart failure (HCC) 03/19/2022   Other abnormalities of  gait and mobility 03/02/2022   Obesity (BMI 30-39.9) 02/19/2022   Prediabetes 02/19/2022   Sinus bradycardia 02/19/2022   TIA (transient ischemic attack) 02/18/2022   Chronic pain 02/18/2022   Prolonged QT syndrome 02/05/2022   Status post left breast lumpectomy 01/23/2021   History of total knee arthroplasty, right 04/17/2020   S/P TKR (total knee  replacement) using cement, left 04/12/2019   Benign neoplasm of ascending colon    Diverticulosis of large intestine without diverticulitis    Pancreatic lesion 04/21/2018   Abdominal aortic atherosclerosis 04/21/2018   Acute peptic ulcer of stomach    Trigger thumb of right hand 02/25/2018   Carpal tunnel syndrome on both sides 10/21/2017   Elevated C-reactive protein 07/28/2017   Osteoarthritis of both knees 02/19/2016   COPD, mild (HCC) 05/08/2015   Vitamin D  deficiency 05/07/2015   Metabolic syndrome 05/07/2015   Eczema 05/07/2015   Chronic insomnia 05/07/2015   Hypertension, benign 05/07/2015   Dyslipidemia 05/07/2015   Hyperglycemia 05/07/2015   Chronic low back pain 05/07/2015   Irritable bowel syndrome with both constipation and diarrhea 05/07/2015   Gastroesophageal reflux disease without esophagitis 05/07/2015   Major depression, recurrent, chronic 05/07/2015   Menopausal syndrome (hot flashes) 05/07/2015   History of postmenopausal bleeding 01/14/2015    Past Surgical History:  Procedure Laterality Date   ACHILLES TENDON SURGERY Left 01/02/2022   Procedure: ACHILLES TENDON REPAIR-SECONDARY;  Surgeon: Ashley Soulier, DPM;  Location: [facility];  Service: Podiatry;  Laterality: Left;   BACK SURGERY     BREAST EXCISIONAL BIOPSY Left ?   neg   BREAST LUMPECTOMY Left 01/08/2021   Procedure: BREAST LUMPECTOMY;  Surgeon: Lane Shope, MD;  Location: [facility];  Service: General;  Laterality: Left;   CARPAL TUNNEL RELEASE Left 08/15/2018   Procedure: CARPAL TUNNEL RELEASE;  Surgeon: Cleotilde Barrio, MD;  Location: [facility];  Service: Orthopedics;  Laterality: Left;   CERVICAL DISCECTOMY     x 2; metal plate   CHOLECYSTECTOMY     COLONOSCOPY WITH PROPOFOL  N/A 08/08/2018   Procedure: COLONOSCOPY WITH PROPOFOL ;  Surgeon: Janalyn Keene NOVAK, MD;  Location: [facility];  Service: Endoscopy;  Laterality: N/A;   COLONOSCOPY WITH PROPOFOL  N/A 12/01/2021   Procedure: COLONOSCOPY  WITH PROPOFOL ;  Surgeon: Unk Corinn Skiff, MD;  Location: [facility];  Service: Gastroenterology;  Laterality: N/A;   DILATATION & CURETTAGE/HYSTEROSCOPY WITH MYOSURE N/A 03/21/2015   Procedure: DILATATION & CURETTAGE/HYSTEROSCOPY;  Surgeon: Bebe Furry, MD;  Location: [facility];  Service: Gynecology;  Laterality: N/A;   DILATION AND CURETTAGE OF UTERUS     ENDOMETRIAL ABLATION     ESOPHAGOGASTRODUODENOSCOPY (EGD) WITH PROPOFOL  N/A 04/13/2018   Procedure: ESOPHAGOGASTRODUODENOSCOPY (EGD) WITH PROPOFOL ;  Surgeon: Janalyn Keene NOVAK, MD;  Location: [facility];  Service: Endoscopy;  Laterality: N/A;   ESOPHAGOGASTRODUODENOSCOPY (EGD) WITH PROPOFOL  N/A 08/08/2018   Procedure: ESOPHAGOGASTRODUODENOSCOPY (EGD) WITH PROPOFOL ;  Surgeon: Janalyn Keene NOVAK, MD;  Location: [facility];  Service: Endoscopy;  Laterality: N/A;   ESOPHAGOGASTRODUODENOSCOPY (EGD) WITH PROPOFOL  N/A 03/25/2022   Procedure: ESOPHAGOGASTRODUODENOSCOPY (EGD) WITH PROPOFOL ;  Surgeon: Unk Corinn Skiff, MD;  Location: [facility];  Service: Gastroenterology;  Laterality: N/A;   ESOPHAGOGASTRODUODENOSCOPY (EGD) WITH PROPOFOL  N/A 03/27/2022   Procedure: ESOPHAGOGASTRODUODENOSCOPY (EGD) WITH PROPOFOL ;  Surgeon: Unk Corinn Skiff, MD;  Location: [facility];  Service: Gastroenterology;  Laterality: N/A;   JOINT REPLACEMENT     OSTECTOMY Left 01/02/2022   Procedure: CALCANEAL EXOSTECTOMY;  Surgeon: Ashley Soulier, DPM;  Location: [facility];  Service: Podiatry;  Laterality: Left;   SPINAL FUSION     lumbar x2   TONSILLECTOMY     TOTAL KNEE ARTHROPLASTY Left 04/12/2019   Procedure: TOTAL KNEE ARTHROPLASTY;  Surgeon: Leora Lynwood SAUNDERS, MD;  Location: [facility];  Service: Orthopedics;  Laterality: Left;   TOTAL KNEE ARTHROPLASTY Right 04/17/2020   Procedure: TOTAL KNEE ARTHROPLASTY;  Surgeon: Leora Lynwood SAUNDERS, MD;  Location: [facility];  Service: Orthopedics;  Laterality: Right;   TUBAL LIGATION      Family History   Problem Relation Age of Onset   Heart disease Mother    Thyroid  cancer Mother    Congestive Heart Failure Mother    Hypertension Father    Heart failure Maternal Uncle    Diabetes Maternal Uncle    Kidney disease Maternal Uncle    Breast cancer Maternal Grandmother    Heart failure Maternal Grandfather    Depression Daughter    Asthma Daughter     Social History   Tobacco Use   Smoking status: Former    Current packs/day: 0.00    Average packs/day: 1 pack/day for 20.0 years (20.0 ttl pk-yrs)    Types: Cigarettes    Start date: 07/26/2002    Quit date: 04/06/2019    Years since quitting: 5.3   Smokeless tobacco: Former    Quit date: 04/05/2022   Tobacco comments:    Quit Date 04/05/2022  Substance Use Topics   Alcohol  use: Yes    Comment: occasional beer     Current Outpatient Medications:    amitriptyline  (ELAVIL ) 10 MG tablet, Take 1 tablet (10 mg total) by mouth at bedtime., Disp: 90 tablet, Rfl: 0   amLODipine  (NORVASC ) 5 MG tablet, Take 1 tablet (5 mg total) by mouth daily., Disp: 90 tablet, Rfl: 3   aspirin  EC 81 MG EC tablet, Take 1 tablet (81 mg total) by mouth daily. Swallow whole., Disp: 30 tablet, Rfl: 11   atorvastatin  (LIPITOR) 20 MG tablet, Take 1 tablet (20 mg total) by mouth daily., Disp: 90 tablet, Rfl: 1   carvedilol  (COREG ) 6.25 MG tablet, TAKE 1 TABLET(6.25 MG) BY MOUTH TWICE DAILY, Disp: 180 tablet, Rfl: 2   cholecalciferol  (VITAMIN D ) 1000 units tablet, Take 1,000 Units by mouth daily., Disp: , Rfl:    Cyanocobalamin  (B-12) 1000 MCG SUBL, Place 1 each under the tongue 3 (three) times a week., Disp: , Rfl:    desvenlafaxine  (PRISTIQ ) 50 MG 24 hr tablet, Take 1 tablet (50 mg total) by mouth daily., Disp: 90 tablet, Rfl: 0   gabapentin  (NEURONTIN ) 300 MG capsule, Take 300 mg by mouth every 6 (six) hours as needed (Nerve pain)., Disp: , Rfl:    lidocaine  (LIDODERM ) 5 %, Place 1 patch onto the skin daily. Remove & Discard patch within 12 hours or as directed by  MD, Disp: 30 patch, Rfl: 0   losartan -hydrochlorothiazide  (HYZAAR) 100-25 MG tablet, Take 1 tablet by mouth daily., Disp: 90 tablet, Rfl: 0   Multiple Vitamins-Minerals (MULTIVITAMIN WITH MINERALS) tablet, Take 1 tablet by mouth every other day., Disp: , Rfl:    NARCAN 4 MG/0.1ML LIQD nasal spray kit, Place 1 spray into the nose once., Disp: , Rfl:    Oxycodone  HCl 20 MG TABS, Take 20 mg by mouth every 4 (four) hours as needed (pain)., Disp: , Rfl:    pantoprazole  (PROTONIX ) 40 MG tablet, Take 1 tablet (40 mg total) by mouth daily., Disp: 90 tablet, Rfl: 1   QUEtiapine  (SEROQUEL ) 25 MG tablet, Take 1 tablet (  25 mg total) by mouth at bedtime., Disp: 90 tablet, Rfl: 0   spironolactone  (ALDACTONE ) 25 MG tablet, TAKE 1/2 TABLET(12.5 MG) BY MOUTH DAILY, Disp: 45 tablet, Rfl: 1   tiZANidine  (ZANAFLEX ) 4 MG tablet, Take 4 mg by mouth every 8 (eight) hours., Disp: , Rfl:    valACYclovir  (VALTREX ) 1000 MG tablet, TAKE 1 TABLET(1000 MG) BY MOUTH TWICE DAILY AS NEEDED, Disp: 10 tablet, Rfl: 1   VRAYLAR  1.5 MG capsule, Take 1.5 mg by mouth daily., Disp: , Rfl:   No Known Allergies  I personally reviewed active problem list, medication list, allergies with the patient/caregiver today.   ROS  Ten systems reviewed and is negative except as mentioned in HPI    Objective Physical Exam CONSTITUTIONAL: Patient appears well-developed and well-nourished. No distress. HEENT: Head atraumatic, normocephalic, neck supple. CARDIOVASCULAR: Normal rate, regular rhythm and normal heart sounds. No murmur heard. No BLE edema. Left ankle slightly larger, likely post-surgical. PULMONARY: Effort normal and breath sounds normal. No respiratory distress. No crackles, mild rhonchi from COPD. PSYCHIATRIC: Patient has a normal mood and affect. Behavior is normal. Judgment and thought content normal.  Vitals:   08/02/24 1017  BP: 110/64  Pulse: 72  Resp: 16  SpO2: 97%  Weight: 217 lb 11.2 oz (98.7 kg)  Height: 5' 8  (1.727 m)    Body mass index is 33.1 kg/m.  Recent Results (from the past 2160 hours)  CBC with Differential     Status: Abnormal   Collection Time: 05/28/24  1:25 PM  Result Value Ref Range   WBC 11.2 (H) 4.0 - 10.5 K/uL   RBC 4.40 3.87 - 5.11 MIL/uL   Hemoglobin 13.6 12.0 - 15.0 g/dL   HCT 57.8 63.9 - 53.9 %   MCV 95.7 80.0 - 100.0 fL   MCH 30.9 26.0 - 34.0 pg   MCHC 32.3 30.0 - 36.0 g/dL   RDW 87.5 88.4 - 84.4 %   Platelets 426 (H) 150 - 400 K/uL   nRBC 0.0 0.0 - 0.2 %   Neutrophils Relative % 58 %   Neutro Abs 6.5 1.7 - 7.7 K/uL   Lymphocytes Relative 33 %   Lymphs Abs 3.7 0.7 - 4.0 K/uL   Monocytes Relative 7 %   Monocytes Absolute 0.7 0.1 - 1.0 K/uL   Eosinophils Relative 1 %   Eosinophils Absolute 0.2 0.0 - 0.5 K/uL   Basophils Relative 1 %   Basophils Absolute 0.1 0.0 - 0.1 K/uL   Immature Granulocytes 0 %   Abs Immature Granulocytes 0.04 0.00 - 0.07 K/uL    Comment: Performed at [facility], [address] [place], [place] [address]  Basic metabolic panel     Status: Abnormal   Collection Time: 05/28/24  1:25 PM  Result Value Ref Range   Sodium 141 135 - 145 mmol/L   Potassium 3.8 3.5 - 5.1 mmol/L   Chloride 104 98 - 111 mmol/L   CO2 20 (L) 22 - 32 mmol/L   Glucose, Bld 101 (H) 70 - 99 mg/dL    Comment: Glucose reference range applies only to samples taken after fasting for at least 8 hours.   BUN 8 8 - 23 mg/dL   Creatinine, Ser 9.30 0.44 - 1.00 mg/dL   Calcium  10.1 8.9 - 10.3 mg/dL   GFR, Estimated >39 >39 mL/min    Comment: (NOTE) Calculated using the CKD-EPI Creatinine Equation (2021)    Anion gap 17 (H) 5 - 15  Comment: Performed at [facility], [address] [place], [place] [address]  Type and screen [facility]     Status: None   Collection Time: 05/28/24  1:25 PM  Result Value Ref Range   ABO/RH(D) A POS    Antibody Screen NEG    Sample Expiration      05/31/2024,2359 Performed at [facility], [address] [place], [place] [address]   CK     Status: None   Collection Time: 05/28/24  2:37 PM  Result Value Ref Range   Total CK 54 38 - 234 U/L    Comment: Performed at [facility], [address] [place], [place] [address]  Beta-hydroxybutyric acid     Status: None   Collection Time: 05/28/24  2:37 PM  Result Value Ref Range   Beta-Hydroxybutyric Acid 0.23 0.05 - 0.27 mmol/L    Comment: Performed at [facility], [address] [place], [place] [address]  Lactic acid, plasma     Status: Abnormal   Collection Time: 05/28/24  2:37 PM  Result Value Ref Range   Lactic Acid, Venous 3.2 (HH) 0.5 - 1.9 mmol/L    Comment: CRITICAL RESULT CALLED TO, READ BACK BY AND VERIFIED WITH MEGAN ASHBURN AT 1511 05/28/24.PMF Performed at [facility], [address] [place], [place] [address]   Salicylate level     Status: Abnormal   Collection Time: 05/28/24  2:37 PM  Result Value Ref Range   Salicylate Lvl <7.0 (L) 7.0 - 30.0 mg/dL    Comment: Performed at [facility], [address] [place], [place] [address]  Lactic acid, plasma     Status: Abnormal   Collection Time: 05/28/24  4:07 PM  Result Value Ref Range   Lactic Acid, Venous 3.0 (HH) 0.5 - 1.9 mmol/L    Comment: CRITICAL VALUE NOTED. VALUE IS CONSISTENT WITH PREVIOUSLY REPORTED/CALLED VALUE MJU Performed at [facility], [address] [place], [place] [address]   Lactic acid, plasma     Status: Abnormal   Collection Time: 05/28/24  6:44 PM  Result Value Ref Range   Lactic Acid, Venous 3.2 (HH) 0.5 - 1.9 mmol/L    Comment: CRITICAL VALUE NOTED. VALUE IS CONSISTENT WITH PREVIOUSLY REPORTED/CALLED VALUE JL Performed at [facility], [address] [place], [place] [address]   Brain natriuretic peptide     Status: None   Collection Time: 05/29/24  3:50 AM  Result Value Ref Range   B Natriuretic Peptide 44.8 0.0 - 100.0 pg/mL    Comment: Performed  at [facility], [address] [place], [place] [address]  HIV Antibody (routine testing w rflx)     Status: None   Collection Time: 05/29/24  3:50 AM  Result Value Ref Range   HIV Screen 4th Generation wRfx Non Reactive Non Reactive    Comment: Performed at [facility], [address] [place], [place] [address]  Basic metabolic panel     Status: Abnormal   Collection Time: 05/29/24  3:50 AM  Result Value Ref Range   Sodium 141 135 - 145 mmol/L   Potassium 3.2 (L) 3.5 - 5.1 mmol/L   Chloride 104 98 - 111 mmol/L   CO2 23 22 - 32 mmol/L   Glucose, Bld 135 (H) 70 - 99 mg/dL    Comment: Glucose reference range applies only to samples taken after fasting for at least 8 hours.   BUN 8  8 - 23 mg/dL   Creatinine, Ser 9.43 0.44 - 1.00 mg/dL   Calcium  9.4 8.9 - 10.3 mg/dL   GFR, Estimated >39 >39 mL/min    Comment: (NOTE) Calculated using the CKD-EPI Creatinine Equation (2021)    Anion gap 14 5 - 15    Comment: Performed at [facility], [address] [place], [place] [address]  CBC     Status: Abnormal   Collection Time: 05/29/24  3:50 AM  Result Value Ref Range   WBC 11.5 (H) 4.0 - 10.5 K/uL   RBC 4.07 3.87 - 5.11 MIL/uL   Hemoglobin 12.8 12.0 - 15.0 g/dL   HCT 61.5 63.9 - 53.9 %   MCV 94.3 80.0 - 100.0 fL   MCH 31.4 26.0 - 34.0 pg   MCHC 33.3 30.0 - 36.0 g/dL   RDW 87.5 88.4 - 84.4 %   Platelets 357 150 - 400 K/uL   nRBC 0.0 0.0 - 0.2 %    Comment: Performed at [facility], [address] [place], [place] [address]  Lactic acid, plasma     Status: Abnormal   Collection Time: 05/29/24  3:50 AM  Result Value Ref Range   Lactic Acid, Venous 2.6 (HH) 0.5 - 1.9 mmol/L    Comment: CRITICAL VALUE NOTED. VALUE IS CONSISTENT WITH PREVIOUSLY REPORTED/CALLED VALUE JL Performed at [facility], [address] [place], [place] [address]   Lactic acid, plasma     Status: Abnormal   Collection Time: 05/29/24  5:34 AM  Result Value Ref  Range   Lactic Acid, Venous 2.7 (HH) 0.5 - 1.9 mmol/L    Comment: CRITICAL VALUE NOTED. VALUE IS CONSISTENT WITH PREVIOUSLY REPORTED/CALLED VALUE MW Performed at [facility], [address] [place], [place] [address]   Lactic acid, plasma     Status: None   Collection Time: 05/29/24  8:48 AM  Result Value Ref Range   Lactic Acid, Venous 1.9 0.5 - 1.9 mmol/L    Comment: Performed at [facility], [address] [place], [place] [address]  CBC     Status: None   Collection Time: 05/30/24  3:30 AM  Result Value Ref Range   WBC 8.3 4.0 - 10.5 K/uL   RBC 4.05 3.87 - 5.11 MIL/uL   Hemoglobin 12.7 12.0 - 15.0 g/dL   HCT 61.0 63.9 - 53.9 %   MCV 96.0 80.0 - 100.0 fL   MCH 31.4 26.0 - 34.0 pg   MCHC 32.6 30.0 - 36.0 g/dL   RDW 87.2 88.4 - 84.4 %   Platelets 334 150 - 400 K/uL   nRBC 0.0 0.0 - 0.2 %    Comment: Performed at [facility], [address], [place], [place] [address]  Basic metabolic panel with GFR     Status: Abnormal   Collection Time: 05/30/24  3:30 AM  Result Value Ref Range   Sodium 142 135 - 145 mmol/L   Potassium 3.5 3.5 - 5.1 mmol/L   Chloride 108 98 - 111 mmol/L   CO2 25 22 - 32 mmol/L   Glucose, Bld 118 (H) 70 - 99 mg/dL    Comment: Glucose reference range applies only to samples taken after fasting for at least 8 hours.   BUN 9 8 - 23 mg/dL   Creatinine, Ser 9.37 0.44 - 1.00 mg/dL   Calcium  9.2 8.9 - 10.3 mg/dL   GFR, Estimated >39 >39 mL/min    Comment: (NOTE) Calculated using the  CKD-EPI Creatinine Equation (2021)    Anion gap 9 5 - 15    Comment: Performed at [facility], [address] [place], [place] [address]  Magnesium      Status: None   Collection Time: 05/30/24  3:30 AM  Result Value Ref Range   Magnesium  2.2 1.7 - 2.4 mg/dL    Comment: Performed at [facility], [address] [place], [place] [address]    Diabetic Foot Exam:     PHQ2/9:    08/02/2024   10:16 AM 03/31/2024    9:00  AM 02/02/2024    2:33 PM 10/19/2023    2:52 PM 08/12/2023    3:08 PM  Depression screen PHQ 2/9  Decreased Interest 3 1 1 2  0  Down, Depressed, Hopeless 3 1 1 2  0  PHQ - 2 Score 6 2 2 4  0  Altered sleeping 3 1 1  0   Tired, decreased energy 3 1 1 2    Change in appetite 0 0 0 0   Feeling bad or failure about yourself  0 0 0 1   Trouble concentrating 3 0 0 0   Moving slowly or fidgety/restless 0 0 0 0   Suicidal thoughts 0 0 0 0   PHQ-9 Score 15 4 4 7    Difficult doing work/chores Very difficult Not difficult at all Somewhat difficult Somewhat difficult     phq 9 is positive  Fall Risk:    08/02/2024   10:13 AM 03/31/2024    8:58 AM 10/19/2023    2:51 PM 08/12/2023    3:08 PM 07/19/2023    1:42 PM  Fall Risk   Falls in the past year? 1 0 1 1 1   Number falls in past yr: 0 0 0 1 0  Injury with Fall? 1 0 0 1 1  Risk for fall due to : Impaired balance/gait No Fall Risks Impaired balance/gait No Fall Risks;Impaired mobility;Impaired balance/gait;History of fall(s) No Fall Risks  Follow up Falls evaluation completed Falls evaluation completed Falls prevention discussed;Education provided;Falls evaluation completed Falls prevention discussed      Assessment & Plan Neuropathic pain of feet and legs Chronic neuropathic pain likely related to previous foot surgery and lumbar spine issues. Symptoms include burning, numbness, and cold sensations. - Continue gabapentin  and evaluation by pain clinci  - Follow up with pain management specialist. - Consider nerve conduction study if symptoms persist.  Chronic pain syndrome Chronic pain syndrome significantly impacts quality of life, likely multifactorial with contributions from neuropathy and degenerative disc disease. - Continue current pain management regimen. - Follow up with pain management specialist.  Degenerative disc disease of lumbar spine Degenerative disc disease contributing to chronic pain and possibly neuropathic symptoms in  legs. - Continue follow-up with pain management specialist.  Chronic diastolic heart failure and pulmonary hypertension Chronic diastolic heart failure with pulmonary hypertension. Last echocardiogram showed diastolic dysfunction. Symptoms include occasional shortness of breath and left leg swelling. - Follow up with cardiologist. - Monitor for symptoms of heart failure. - based on records spironolactone  caused hyperkalemia and she has not been taking for months  Chronic kidney disease stage 3a Chronic kidney disease stage 3a managed with losartan  for kidney protection. - Continue losartan . - Monitor kidney function with regular blood tests. - Encourage adequate hydration and avoidance of NSAIDs.  Essential hypertension Essential hypertension managed with multiple antihypertensives. Blood pressure control crucial for kidney and heart health. - Continue current antihypertensive regimen. - Monitor blood pressure regularly.  Chronic obstructive pulmonary disease (COPD) COPD with nighttime cough. Trelegy preferred due to insurance coverage. - Prescribe Trelegy inhaler, one puff daily.  Depression, recurrent Recurrent depression with ongoing symptoms. Vraylar  preferred for mood stabilization, Seroquel  used for sleep. - Continue Pristiq  for depression. - Prescribe Vraylar  for mood stabilization. - Discontinue Seroquel  and consider alternative for sleep. - Encourage finding a therapist for talk therapy. - Consider psychiatric evaluation for medication management.  General Health Maintenance Routine health maintenance discussed, emphasizing regular follow-ups and vaccinations. - Administer flu shot today. - Schedule follow-up in six months. - Encourage adherence to medical appointments.

## 2024-08-03 LAB — CBC WITH DIFFERENTIAL/PLATELET
Absolute Lymphocytes: 2124 {cells}/uL (ref 850–3900)
Absolute Monocytes: 622 {cells}/uL (ref 200–950)
Basophils Absolute: 22 {cells}/uL (ref 0–200)
Basophils Relative: 0.3 %
Eosinophils Absolute: 133 {cells}/uL (ref 15–500)
Eosinophils Relative: 1.8 %
HCT: 36.7 % (ref 35.0–45.0)
Hemoglobin: 11.7 g/dL (ref 11.7–15.5)
MCH: 30.5 pg (ref 27.0–33.0)
MCHC: 31.9 g/dL — ABNORMAL LOW (ref 32.0–36.0)
MCV: 95.8 fL (ref 80.0–100.0)
MPV: 11.3 fL (ref 7.5–12.5)
Monocytes Relative: 8.4 %
Neutro Abs: 4499 {cells}/uL (ref 1500–7800)
Neutrophils Relative %: 60.8 %
Platelets: 332 Thousand/uL (ref 140–400)
RBC: 3.83 Million/uL (ref 3.80–5.10)
RDW: 12.4 % (ref 11.0–15.0)
Total Lymphocyte: 28.7 %
WBC: 7.4 Thousand/uL (ref 3.8–10.8)

## 2024-08-03 LAB — COMPREHENSIVE METABOLIC PANEL WITH GFR
AG Ratio: 1.7 (calc) (ref 1.0–2.5)
ALT: 28 U/L (ref 6–29)
AST: 48 U/L — ABNORMAL HIGH (ref 10–35)
Albumin: 4.5 g/dL (ref 3.6–5.1)
Alkaline phosphatase (APISO): 120 U/L (ref 37–153)
BUN: 16 mg/dL (ref 7–25)
CO2: 30 mmol/L (ref 20–32)
Calcium: 10 mg/dL (ref 8.6–10.4)
Chloride: 102 mmol/L (ref 98–110)
Creat: 0.71 mg/dL (ref 0.50–1.05)
Globulin: 2.6 g/dL (ref 1.9–3.7)
Glucose, Bld: 113 mg/dL — ABNORMAL HIGH (ref 65–99)
Potassium: 4.4 mmol/L (ref 3.5–5.3)
Sodium: 140 mmol/L (ref 135–146)
Total Bilirubin: 0.7 mg/dL (ref 0.2–1.2)
Total Protein: 7.1 g/dL (ref 6.1–8.1)
eGFR: 97 mL/min/1.73m2 (ref >=60)

## 2024-08-03 LAB — LIPID PANEL
Cholesterol: 126 mg/dL (ref <200)
HDL: 45 mg/dL — ABNORMAL LOW (ref >=50)
LDL Cholesterol (Calc): 56 mg/dL
Non-HDL Cholesterol (Calc): 81 mg/dL (ref <130)
Total CHOL/HDL Ratio: 2.8 (calc) (ref <5.0)
Triglycerides: 170 mg/dL — ABNORMAL HIGH (ref <150)

## 2024-08-03 LAB — B12 AND FOLATE PANEL
Folate: 16.8 ng/mL
Vitamin B-12: 493 pg/mL (ref 200–1100)

## 2024-08-03 LAB — PARATHYROID HORMONE, INTACT (NO CA): PTH: 24 pg/mL (ref 16–77)

## 2024-08-03 LAB — VITAMIN D 25 HYDROXY (VIT D DEFICIENCY, FRACTURES): Vit D, 25-Hydroxy: 66 ng/mL (ref 30–100)

## 2024-08-04 ENCOUNTER — Encounter: Payer: Self-pay | Admitting: Family Medicine

## 2024-08-04 ENCOUNTER — Ambulatory Visit: Payer: Self-pay | Admitting: Family Medicine

## 2024-08-06 ENCOUNTER — Other Ambulatory Visit: Payer: Self-pay | Admitting: Family Medicine

## 2024-08-10 DIAGNOSIS — M25551 Pain in right hip: Secondary | ICD-10-CM | POA: Diagnosis not present

## 2024-08-10 DIAGNOSIS — M5136 Other intervertebral disc degeneration, lumbar region with discogenic back pain only: Secondary | ICD-10-CM | POA: Diagnosis not present

## 2024-08-10 DIAGNOSIS — Z79891 Long term (current) use of opiate analgesic: Secondary | ICD-10-CM | POA: Diagnosis not present

## 2024-08-10 DIAGNOSIS — G894 Chronic pain syndrome: Secondary | ICD-10-CM | POA: Diagnosis not present

## 2024-08-10 DIAGNOSIS — M545 Low back pain, unspecified: Secondary | ICD-10-CM | POA: Diagnosis not present

## 2024-08-21 ENCOUNTER — Encounter: Payer: Self-pay | Admitting: Cardiology

## 2024-08-21 ENCOUNTER — Ambulatory Visit: Attending: Cardiology | Admitting: Cardiology

## 2024-08-21 VITALS — BP 130/62 | HR 68 | Ht 68.0 in | Wt 226.6 lb

## 2024-08-21 DIAGNOSIS — E785 Hyperlipidemia, unspecified: Secondary | ICD-10-CM | POA: Diagnosis not present

## 2024-08-21 DIAGNOSIS — I10 Essential (primary) hypertension: Secondary | ICD-10-CM | POA: Diagnosis not present

## 2024-08-21 DIAGNOSIS — I5032 Chronic diastolic (congestive) heart failure: Secondary | ICD-10-CM | POA: Insufficient documentation

## 2024-08-21 MED ORDER — FUROSEMIDE 20 MG PO TABS: 20.0000 mg | ORAL_TABLET | Freq: Every day | ORAL | 3 refills | Status: AC

## 2024-08-21 NOTE — Patient Instructions (Signed)
 Medication Instructions:  - START lasix  20 mg daily   *If you need a refill on your cardiac medications before your next appointment, please call your pharmacy*  Lab Work: Your provider would like for you to return in 10 days to have the following labs drawn: BMP.   Please go to [facility] [address] ([facility]) [address], [place] [address] You do not need an appointment.  They are open from 8 am- 4:30 pm.  Lunch from 1:00 pm- 2:00 pm You do not need to be fasting.  If you have labs (blood work) drawn today and your tests are completely normal, you will receive your results only by: MyChart Message (if you have MyChart) OR A paper copy in the mail If you have any lab test that is abnormal or we need to change your treatment, we will call you to review the results.  Testing/Procedures: No test ordered today   Follow-Up: At [facility], you and your health needs are our priority.  As part of our continuing mission to provide you with exceptional heart care, our providers are all part of one team.  This team includes your primary Cardiologist (physician) and Advanced Practice Providers or APPs (Physician Assistants and Nurse Practitioners) who all work together to provide you with the care you need, when you need it.  Your next appointment:   2 month(s)  Provider:   You may see Redell Cave, MD or one of the following Advanced Practice Providers on your designated Care Team:   Lonni Meager, NP Lesley Maffucci, PA-C Bernardino Bring, PA-C Cadence Shirley, PA-C Tylene Lunch, NP Barnie Hila, NP    We recommend signing up for the patient portal called MyChart.  Sign up information is provided on this After Visit Summary.  MyChart is used to connect with patients for Virtual Visits (Telemedicine).  Patients are able to view lab/test results, encounter notes, upcoming appointments, etc.  Non-urgent messages can be sent to your provider as well.    To learn more about what you can do with MyChart, go to [email or web address].

## 2024-08-21 NOTE — Progress Notes (Signed)
 Cardiology Office Note:    Date:  08/21/2024   ID:  [name], DOB [birth year], MRN [id number]  PCP:  Sowles, Krichna, MD   [facility] Providers Cardiologist:  Redell Cave, MD     Referring MD: Glenard Mire, MD   Chief Complaint  Patient presents with   Follow-up    12 month follow up pt has been doing well with no complaints of chest pain, chest pressure or SOB, medciation reviewed verbally with patient    History of Present Illness:    [name] is a 63 y.o. female with a hx of HFpEF, hypertension, hyperlipidemia, diabetes, former smoker x20+ years, COPD who presents for follow-up.  Doing okay, endorses some shortness of breath and leg edema.  Was previously on Lasix , has not taken Lasix  over the past several months.  BP at home adequately controlled.  Endorses chest discomfort relieved with taking Tums.  Previous coronary CT obtained last year showed no CAD.  Prior notes Coronary CT 8/24 no CAD. Echocardiogram 02/19/2022 EF 60 to 65%, grade 2 diastolic dysfunction.  Past Medical History:  Diagnosis Date   Abdominal aortic atherosclerosis    Anemia    Arthritis    bilateral knees   Bronchitis    Carpal tunnel syndrome, bilateral    CHF (congestive heart failure) (HCC)    Contact dermatitis and other eczema, due to unspecified cause    COPD (chronic obstructive pulmonary disease) (HCC)    DDD (degenerative disc disease)    Depressive disorder    Diverticulosis    Dysmetabolic syndrome X    Eczema    GERD (gastroesophageal reflux disease)    Hyperlipidemia    Hypertension    IBS (irritable bowel syndrome)    Insomnia    Lumbago    Nonspecific abnormal electrocardiogram (ECG) (EKG)    Osteoarthritis of both knees    Other ovarian failure(256.39)    Peptic ulcer    Postmenopausal atrophic vaginitis    Stroke (HCC)    2023, no deficits, not on thinners   Symptomatic menopausal or female climacteric states    Unspecified vitamin D   deficiency     Past Surgical History:  Procedure Laterality Date   ACHILLES TENDON SURGERY Left 01/02/2022   Procedure: ACHILLES TENDON REPAIR-SECONDARY;  Surgeon: Ashley Soulier, DPM;  Location: [facility] OR[facility];  Service: Podiatry;  Laterality: Left;   BACK SURGERY     BREAST EXCISIONAL BIOPSY Left ?   neg   BREAST LUMPECTOMY Left 01/08/2021   Procedure: BREAST LUMPECTOMY;  Surgeon: Lane Shope, MD;  Location: [facility];  Service: General;  Laterality: Left;   CARPAL TUNNEL RELEASE Left 08/15/2018   Procedure: CARPAL TUNNEL RELEASE;  Surgeon: Cleotilde Barrio, MD;  Location: [facility];  Service: Orthopedics;  Laterality: Left;   CERVICAL DISCECTOMY     x 2; metal plate   CHOLECYSTECTOMY     COLONOSCOPY WITH PROPOFOL  N/A 08/08/2018   Procedure: COLONOSCOPY WITH PROPOFOL ;  Surgeon: Janalyn Keene NOVAK, MD;  Location: [facility];  Service: Endoscopy;  Laterality: N/A;   COLONOSCOPY WITH PROPOFOL  N/A 12/01/2021   Procedure: COLONOSCOPY WITH PROPOFOL ;  Surgeon: Unk Corinn Skiff, MD;  Location: [facility];  Service: Gastroenterology;  Laterality: N/A;   DILATATION & CURETTAGE/HYSTEROSCOPY WITH MYOSURE N/A 03/21/2015   Procedure: DILATATION & CURETTAGE/HYSTEROSCOPY;  Surgeon: Bebe Furry, MD;  Location: [facility];  Service: Gynecology;  Laterality: N/A;   DILATION AND CURETTAGE OF UTERUS     ENDOMETRIAL ABLATION     ESOPHAGOGASTRODUODENOSCOPY (EGD)  WITH PROPOFOL  N/A 04/13/2018   Procedure: ESOPHAGOGASTRODUODENOSCOPY (EGD) WITH PROPOFOL ;  Surgeon: Janalyn Keene NOVAK, MD;  Location: [facility];  Service: Endoscopy;  Laterality: N/A;   ESOPHAGOGASTRODUODENOSCOPY (EGD) WITH PROPOFOL  N/A 08/08/2018   Procedure: ESOPHAGOGASTRODUODENOSCOPY (EGD) WITH PROPOFOL ;  Surgeon: Janalyn Keene NOVAK, MD;  Location: [facility];  Service: Endoscopy;  Laterality: N/A;   ESOPHAGOGASTRODUODENOSCOPY (EGD) WITH PROPOFOL  N/A 03/25/2022   Procedure: ESOPHAGOGASTRODUODENOSCOPY (EGD) WITH PROPOFOL ;  Surgeon:  Unk Corinn Skiff, MD;  Location: [facility];  Service: Gastroenterology;  Laterality: N/A;   ESOPHAGOGASTRODUODENOSCOPY (EGD) WITH PROPOFOL  N/A 03/27/2022   Procedure: ESOPHAGOGASTRODUODENOSCOPY (EGD) WITH PROPOFOL ;  Surgeon: Unk Corinn Skiff, MD;  Location: [facility];  Service: Gastroenterology;  Laterality: N/A;   JOINT REPLACEMENT     OSTECTOMY Left 01/02/2022   Procedure: CALCANEAL EXOSTECTOMY;  Surgeon: Ashley Soulier, DPM;  Location: [facility];  Service: Podiatry;  Laterality: Left;   SPINAL FUSION     lumbar x2   TONSILLECTOMY     TOTAL KNEE ARTHROPLASTY Left 04/12/2019   Procedure: TOTAL KNEE ARTHROPLASTY;  Surgeon: Leora Lynwood SAUNDERS, MD;  Location: [facility];  Service: Orthopedics;  Laterality: Left;   TOTAL KNEE ARTHROPLASTY Right 04/17/2020   Procedure: TOTAL KNEE ARTHROPLASTY;  Surgeon: Leora Lynwood SAUNDERS, MD;  Location: [facility];  Service: Orthopedics;  Laterality: Right;   TUBAL LIGATION      Current Medications: Current Meds  Medication Sig   amLODipine  (NORVASC ) 5 MG tablet Take 1 tablet (5 mg total) by mouth daily.   aspirin  EC 81 MG EC tablet Take 1 tablet (81 mg total) by mouth daily. Swallow whole.   atorvastatin  (LIPITOR) 20 MG tablet Take 1 tablet (20 mg total) by mouth daily.   carvedilol  (COREG ) 6.25 MG tablet TAKE 1 TABLET(6.25 MG) BY MOUTH TWICE DAILY   cholecalciferol  (VITAMIN D ) 1000 units tablet Take 1,000 Units by mouth daily.   Cyanocobalamin  (B-12) 1000 MCG SUBL Place 1 each under the tongue 3 (three) times a week.   desvenlafaxine  (PRISTIQ ) 50 MG 24 hr tablet Take 1 tablet (50 mg total) by mouth daily.   Fluticasone -Umeclidin-Vilant (TRELEGY ELLIPTA) 100-62.5-25 MCG/ACT AEPB Inhale 1 puff into the lungs daily.   furosemide  (LASIX ) 20 MG tablet Take 1 tablet (20 mg total) by mouth daily.   gabapentin  (NEURONTIN ) 300 MG capsule Take 300 mg by mouth every 6 (six) hours as needed (Nerve pain).   lidocaine  (LIDODERM ) 5 % Place 1 patch onto the skin daily.  Remove & Discard patch within 12 hours or as directed by MD   losartan -hydrochlorothiazide  (HYZAAR) 100-25 MG tablet Take 1 tablet by mouth daily.   Multiple Vitamins-Minerals (MULTIVITAMIN WITH MINERALS) tablet Take 1 tablet by mouth every other day.   NARCAN 4 MG/0.1ML LIQD nasal spray kit Place 1 spray into the nose once.   Oxycodone  HCl 20 MG TABS Take 20 mg by mouth every 4 (four) hours as needed (pain).   pantoprazole  (PROTONIX ) 40 MG tablet Take 1 tablet (40 mg total) by mouth daily.   QUEtiapine  (SEROQUEL ) 25 MG tablet Take 1 tablet (25 mg total) by mouth at bedtime.   tiZANidine  (ZANAFLEX ) 4 MG tablet Take 4 mg by mouth every 8 (eight) hours.   valACYclovir  (VALTREX ) 1000 MG tablet TAKE 1 TABLET(1000 MG) BY MOUTH TWICE DAILY AS NEEDED   [DISCONTINUED] spironolactone  (ALDACTONE ) 25 MG tablet TAKE 1/2 TABLET(12.5 MG) BY MOUTH DAILY     Allergies:   Patient has no known allergies.   Social History   Socioeconomic History  Marital status: Married    Spouse name: Jayson   Number of children: 4   Years of education: Not on file   Highest education level: 12th grade  Occupational History   Occupation: disabled    Comment: chronic back pain   Tobacco Use   Smoking status: Former    Current packs/day: 0.00    Average packs/day: 1 pack/day for 20.0 years (20.0 ttl pk-yrs)    Types: Cigarettes    Start date: 07/26/2002    Quit date: 04/06/2019    Years since quitting: 5.3   Smokeless tobacco: Former    Quit date: 04/05/2022   Tobacco comments:    Quit Date 04/05/2022  Vaping Use   Vaping status: Never Used  Substance and Sexual Activity   Alcohol  use: Yes    Comment: occasional beer   Drug use: No   Sexual activity: Yes    Partners: Male    Birth control/protection: Other-see comments    Comment: Ablation  Other Topics Concern   Not on file  Social History Narrative   Not on file   Social Drivers of Health   Financial Resource Strain: Low Risk  (03/31/2024)   Overall  Financial Resource Strain (CARDIA)    Difficulty of Paying Living Expenses: Not hard at all  Food Insecurity: No Food Insecurity (05/29/2024)   Hunger Vital Sign    Worried About Running Out of Food in the Last Year: Never true    Ran Out of Food in the Last Year: Never true  Transportation Needs: No Transportation Needs (05/29/2024)   PRAPARE - Administrator, Civil Service (Medical): No    Lack of Transportation (Non-Medical): No  Physical Activity: Patient Declined (03/31/2024)   Exercise Vital Sign    Days of Exercise per Week: Patient declined    Minutes of Exercise per Session: Patient declined  Stress: No Stress Concern Present (03/31/2024)   [employer] of Occupational Health - Occupational Stress Questionnaire    Feeling of Stress : Not at all  Social Connections: Socially Isolated (05/29/2024)   Social Connection and Isolation Panel    Frequency of Communication with Friends and Family: Never    Frequency of Social Gatherings with Friends and Family: Never    Attends Religious Services: Never    Database administrator or Organizations: No    Attends Engineer, structural: Never    Marital Status: Married     Family History: The patient's family history includes Asthma in her daughter; Breast cancer in her maternal grandmother; Congestive Heart Failure in her mother; Depression in her daughter; Diabetes in her maternal uncle; Heart disease in her mother; Heart failure in her maternal grandfather and maternal uncle; Hypertension in her father; Kidney disease in her maternal uncle; Thyroid  cancer in her mother.  ROS:   Please see the history of present illness.     All other systems reviewed and are negative.  EKGs/Labs/Other Studies Reviewed:    The following studies were reviewed today:   EKG Interpretation Date/Time:  Monday August 21 2024 11:59:52 EDT Ventricular Rate:  68 PR Interval:  152 QRS Duration:  86 QT Interval:  394 QTC  Calculation: 418 R Axis:   67  Text Interpretation: Normal sinus rhythm Nonspecific T wave abnormality Confirmed by Darliss Rogue ([id number]) on 08/21/2024 12:01:43 PM    Recent Labs: 05/29/2024: B Natriuretic Peptide 44.8 05/30/2024: Magnesium  2.2 08/02/2024: ALT 28; BUN 16; Creat 0.71; Hemoglobin 11.7; Platelets 332; Potassium 4.4; Sodium  140  Recent Lipid Panel    Component Value Date/Time   CHOL 126 08/02/2024 1100   TRIG 170 (H) 08/02/2024 1100   HDL 45 (L) 08/02/2024 1100   CHOLHDL 2.8 08/02/2024 1100   VLDL 52 (H) 02/19/2022 0339   LDLCALC 56 08/02/2024 1100     Risk Assessment/Calculations:          Physical Exam:    VS:  BP 130/62 (BP Location: Left Arm, Patient Position: Sitting, Cuff Size: Large)   Pulse 68   Ht 5' 8 (1.727 m)   Wt 226 lb 9.6 oz (102.8 kg)   SpO2 95%   BMI 34.45 kg/m     Wt Readings from Last 3 Encounters:  08/21/24 226 lb 9.6 oz (102.8 kg)  08/02/24 217 lb 11.2 oz (98.7 kg)  05/30/24 209 lb 10.5 oz (95.1 kg)     GEN:  Well nourished, well developed in no acute distress HEENT: Normal NECK: No JVD; No carotid bruits CARDIAC: RRR, no murmurs, rubs, gallops RESPIRATORY:  Clear to auscultation without rales, wheezing or rhonchi  ABDOMEN: Soft, non-tender, non-distended MUSCULOSKELETAL:  trace edema; No deformity.   SKIN: Warm and dry NEUROLOGIC:  Alert and oriented x 3 PSYCHIATRIC:  Normal affect   ASSESSMENT:    1. Chronic diastolic heart failure (HCC)   2. Primary hypertension   3. Hyperlipidemia, unspecified hyperlipidemia type    PLAN:    In order of problems listed above:  HFpEF, grade 2 diastolic dysfunction.  Trace edema, start Lasix  20 mg daily.  Check BMP in 10 days.  COPD possibly contributing to shortness of breath. Hypertension, BP controlled.  Continue Hyzaar 100-25 mg daily, Norvasc  5 mg daily, Coreg  6.25 mg twice daily. Hyperlipidemia, continue Lipitor 20 mg daily.  Follow-up in 2-3 months.    Medication  Adjustments/Labs and Tests Ordered: Current medicines are reviewed at length with the patient today.  Concerns regarding medicines are outlined above.  Orders Placed This Encounter  Procedures   Basic Metabolic Panel (BMET)   EKG 12-Lead   Meds ordered this encounter  Medications   furosemide  (LASIX ) 20 MG tablet    Sig: Take 1 tablet (20 mg total) by mouth daily.    Dispense:  90 tablet    Refill:  3    Patient Instructions  Medication Instructions:  - START lasix  20 mg daily   *If you need a refill on your cardiac medications before your next appointment, please call your pharmacy*  Lab Work: Your provider would like for you to return in 10 days to have the following labs drawn: BMP.   Please go to [facility] [address] ([facility]) [address], [place] [address] You do not need an appointment.  They are open from 8 am- 4:30 pm.  Lunch from 1:00 pm- 2:00 pm You do not need to be fasting.  If you have labs (blood work) drawn today and your tests are completely normal, you will receive your results only by: MyChart Message (if you have MyChart) OR A paper copy in the mail If you have any lab test that is abnormal or we need to change your treatment, we will call you to review the results.  Testing/Procedures: No test ordered today   Follow-Up: At [facility], you and your health needs are our priority.  As part of our continuing mission to provide you with exceptional heart care, our providers are all part of one team.  This team includes your primary  Cardiologist (physician) and Advanced Practice Providers or APPs (Physician Assistants and Nurse Practitioners) who all work together to provide you with the care you need, when you need it.  Your next appointment:   2 month(s)  Provider:   You may see Redell Cave, MD or one of the following Advanced Practice Providers on your designated Care Team:   Lonni Meager, NP Lesley Maffucci, PA-C Bernardino Bring, PA-C Cadence [place], PA-C Tylene Lunch, NP Barnie Hila, NP    We recommend signing up for the patient portal called MyChart.  Sign up information is provided on this After Visit Summary.  MyChart is used to connect with patients for Virtual Visits (Telemedicine).  Patients are able to view lab/test results, encounter notes, upcoming appointments, etc.  Non-urgent messages can be sent to your provider as well.   To learn more about what you can do with MyChart, go to [email or web address].            Signed, Redell Cave, MD  08/21/2024 1:22 PM    [facility]

## 2024-10-24 ENCOUNTER — Ambulatory Visit: Admitting: Cardiology

## 2024-12-04 ENCOUNTER — Ambulatory Visit: Admitting: Cardiology

## 2024-12-11 ENCOUNTER — Ambulatory Visit: Admitting: Cardiology

## 2025-01-31 ENCOUNTER — Ambulatory Visit: Admitting: Family Medicine

## 2025-04-19 ENCOUNTER — Ambulatory Visit
